# Patient Record
Sex: Female | Born: 1935 | Race: White | Hispanic: No | Smoking: Never smoker
Health system: Southern US, Community
[De-identification: ages and names within clinical notes are randomized; demographics above are authoritative.]

## PROBLEM LIST (undated history)

## (undated) DIAGNOSIS — K626 Ulcer of anus and rectum: Secondary | ICD-10-CM

## (undated) DIAGNOSIS — K5792 Diverticulitis of intestine, part unspecified, without perforation or abscess without bleeding: Secondary | ICD-10-CM

## (undated) DIAGNOSIS — I452 Bifascicular block: Secondary | ICD-10-CM

## (undated) DIAGNOSIS — K219 Gastro-esophageal reflux disease without esophagitis: Secondary | ICD-10-CM

## (undated) DIAGNOSIS — D649 Anemia, unspecified: Secondary | ICD-10-CM

## (undated) DIAGNOSIS — K5010 Crohn's disease of large intestine without complications: Secondary | ICD-10-CM

## (undated) DIAGNOSIS — K3184 Gastroparesis: Secondary | ICD-10-CM

## (undated) DIAGNOSIS — I10 Essential (primary) hypertension: Secondary | ICD-10-CM

## (undated) DIAGNOSIS — K589 Irritable bowel syndrome without diarrhea: Secondary | ICD-10-CM

## (undated) DIAGNOSIS — D126 Benign neoplasm of colon, unspecified: Secondary | ICD-10-CM

## (undated) DIAGNOSIS — E785 Hyperlipidemia, unspecified: Secondary | ICD-10-CM

## (undated) DIAGNOSIS — K5790 Diverticulosis of intestine, part unspecified, without perforation or abscess without bleeding: Secondary | ICD-10-CM

## (undated) DIAGNOSIS — H409 Unspecified glaucoma: Secondary | ICD-10-CM

## (undated) DIAGNOSIS — F419 Anxiety disorder, unspecified: Secondary | ICD-10-CM

## (undated) HISTORY — DX: Ulcer of anus and rectum: K62.6

## (undated) HISTORY — PX: KNEE SURGERY: SHX244

## (undated) HISTORY — DX: Essential (primary) hypertension: I10

## (undated) HISTORY — DX: Anxiety disorder, unspecified: F41.9

## (undated) HISTORY — PX: OTHER SURGICAL HISTORY: SHX169

## (undated) HISTORY — DX: Irritable bowel syndrome, unspecified: K58.9

## (undated) HISTORY — PX: MOUTH SURGERY: SHX715

## (undated) HISTORY — PX: TONSILLECTOMY: SUR1361

## (undated) HISTORY — PX: APPENDECTOMY: SHX54

## (undated) HISTORY — DX: Diverticulosis of intestine, part unspecified, without perforation or abscess without bleeding: K57.90

## (undated) HISTORY — PX: ARCUATE KERATECTOMY: SHX212

## (undated) HISTORY — DX: Unspecified glaucoma: H40.9

## (undated) HISTORY — DX: Crohn's disease of large intestine without complications: K50.10

## (undated) HISTORY — DX: Benign neoplasm of colon, unspecified: D12.6

## (undated) HISTORY — DX: Gastroparesis: K31.84

## (undated) HISTORY — PX: SALIVARY GLAND SURGERY: SHX768

## (undated) HISTORY — DX: Diverticulitis of intestine, part unspecified, without perforation or abscess without bleeding: K57.92

## (undated) HISTORY — DX: Gastro-esophageal reflux disease without esophagitis: K21.9

## (undated) HISTORY — PX: HERNIA REPAIR: SHX51

## (undated) HISTORY — DX: Anemia, unspecified: D64.9

## (undated) HISTORY — DX: Hyperlipidemia, unspecified: E78.5

---

## 1997-09-24 ENCOUNTER — Other Ambulatory Visit: Admission: RE | Admit: 1997-09-24 | Discharge: 1997-09-24 | Payer: Self-pay | Admitting: Internal Medicine

## 1998-01-14 ENCOUNTER — Ambulatory Visit (HOSPITAL_COMMUNITY): Admission: RE | Admit: 1998-01-14 | Discharge: 1998-01-14 | Payer: Self-pay | Admitting: Internal Medicine

## 1999-02-03 ENCOUNTER — Other Ambulatory Visit: Admission: RE | Admit: 1999-02-03 | Discharge: 1999-02-03 | Payer: Self-pay | Admitting: *Deleted

## 1999-07-01 ENCOUNTER — Other Ambulatory Visit: Admission: RE | Admit: 1999-07-01 | Discharge: 1999-07-01 | Payer: Self-pay | Admitting: Orthopaedic Surgery

## 2000-02-10 ENCOUNTER — Encounter (INDEPENDENT_AMBULATORY_CARE_PROVIDER_SITE_OTHER): Payer: Self-pay | Admitting: Specialist

## 2000-02-10 ENCOUNTER — Other Ambulatory Visit: Admission: RE | Admit: 2000-02-10 | Discharge: 2000-02-10 | Payer: Self-pay | Admitting: Gastroenterology

## 2000-05-05 ENCOUNTER — Other Ambulatory Visit: Admission: RE | Admit: 2000-05-05 | Discharge: 2000-05-05 | Payer: Self-pay | Admitting: *Deleted

## 2000-08-03 ENCOUNTER — Encounter: Admission: RE | Admit: 2000-08-03 | Discharge: 2000-08-03 | Payer: Self-pay | Admitting: Gastroenterology

## 2000-08-03 ENCOUNTER — Encounter: Payer: Self-pay | Admitting: Gastroenterology

## 2000-08-11 ENCOUNTER — Ambulatory Visit (HOSPITAL_COMMUNITY): Admission: RE | Admit: 2000-08-11 | Discharge: 2000-08-11 | Payer: Self-pay | Admitting: Gastroenterology

## 2000-08-11 ENCOUNTER — Encounter: Payer: Self-pay | Admitting: Gastroenterology

## 2000-09-13 ENCOUNTER — Inpatient Hospital Stay (HOSPITAL_COMMUNITY): Admission: AD | Admit: 2000-09-13 | Discharge: 2000-09-14 | Payer: Self-pay | Admitting: Interventional Cardiology

## 2000-09-14 ENCOUNTER — Encounter: Payer: Self-pay | Admitting: Interventional Cardiology

## 2001-05-11 ENCOUNTER — Other Ambulatory Visit: Admission: RE | Admit: 2001-05-11 | Discharge: 2001-05-11 | Payer: Self-pay | Admitting: *Deleted

## 2002-05-07 ENCOUNTER — Other Ambulatory Visit: Admission: RE | Admit: 2002-05-07 | Discharge: 2002-05-07 | Payer: Self-pay | Admitting: *Deleted

## 2002-07-30 ENCOUNTER — Encounter: Admission: RE | Admit: 2002-07-30 | Discharge: 2002-09-06 | Payer: Self-pay | Admitting: Internal Medicine

## 2002-10-02 ENCOUNTER — Encounter: Admission: RE | Admit: 2002-10-02 | Discharge: 2002-10-02 | Payer: Self-pay | Admitting: Obstetrics and Gynecology

## 2002-10-02 ENCOUNTER — Encounter: Payer: Self-pay | Admitting: Obstetrics and Gynecology

## 2003-05-20 ENCOUNTER — Other Ambulatory Visit: Admission: RE | Admit: 2003-05-20 | Discharge: 2003-05-20 | Payer: Self-pay | Admitting: Obstetrics and Gynecology

## 2004-05-18 ENCOUNTER — Ambulatory Visit: Payer: Self-pay | Admitting: Internal Medicine

## 2004-05-21 ENCOUNTER — Ambulatory Visit: Payer: Self-pay | Admitting: Internal Medicine

## 2004-05-26 ENCOUNTER — Other Ambulatory Visit: Admission: RE | Admit: 2004-05-26 | Discharge: 2004-05-26 | Payer: Self-pay | Admitting: Obstetrics and Gynecology

## 2004-06-15 ENCOUNTER — Ambulatory Visit: Payer: Self-pay | Admitting: Internal Medicine

## 2004-07-22 ENCOUNTER — Ambulatory Visit: Payer: Self-pay | Admitting: Internal Medicine

## 2004-08-03 ENCOUNTER — Ambulatory Visit: Payer: Self-pay | Admitting: Internal Medicine

## 2004-08-12 ENCOUNTER — Ambulatory Visit: Payer: Self-pay | Admitting: Internal Medicine

## 2004-10-14 ENCOUNTER — Ambulatory Visit: Payer: Self-pay | Admitting: Internal Medicine

## 2004-10-25 ENCOUNTER — Encounter: Admission: RE | Admit: 2004-10-25 | Discharge: 2004-10-25 | Payer: Self-pay | Admitting: Internal Medicine

## 2005-01-28 ENCOUNTER — Ambulatory Visit: Payer: Self-pay | Admitting: Internal Medicine

## 2005-01-31 ENCOUNTER — Ambulatory Visit: Payer: Self-pay | Admitting: Internal Medicine

## 2005-05-20 ENCOUNTER — Ambulatory Visit: Payer: Self-pay | Admitting: Internal Medicine

## 2005-06-16 ENCOUNTER — Ambulatory Visit: Payer: Self-pay | Admitting: Gastroenterology

## 2005-06-17 ENCOUNTER — Ambulatory Visit: Payer: Self-pay | Admitting: Internal Medicine

## 2005-06-29 ENCOUNTER — Ambulatory Visit: Payer: Self-pay | Admitting: Gastroenterology

## 2005-06-29 ENCOUNTER — Encounter (INDEPENDENT_AMBULATORY_CARE_PROVIDER_SITE_OTHER): Payer: Self-pay | Admitting: Specialist

## 2005-09-19 ENCOUNTER — Ambulatory Visit: Payer: Self-pay | Admitting: Internal Medicine

## 2005-10-07 ENCOUNTER — Ambulatory Visit: Payer: Self-pay | Admitting: Internal Medicine

## 2005-10-08 ENCOUNTER — Emergency Department (HOSPITAL_COMMUNITY): Admission: EM | Admit: 2005-10-08 | Discharge: 2005-10-08 | Payer: Self-pay | Admitting: Emergency Medicine

## 2005-10-26 ENCOUNTER — Ambulatory Visit: Payer: Self-pay | Admitting: Internal Medicine

## 2005-12-27 ENCOUNTER — Encounter: Admission: RE | Admit: 2005-12-27 | Discharge: 2005-12-27 | Payer: Self-pay | Admitting: Obstetrics and Gynecology

## 2006-01-31 ENCOUNTER — Encounter: Admission: RE | Admit: 2006-01-31 | Discharge: 2006-02-28 | Payer: Self-pay | Admitting: Orthopaedic Surgery

## 2006-02-16 ENCOUNTER — Ambulatory Visit: Payer: Self-pay | Admitting: Internal Medicine

## 2006-03-01 ENCOUNTER — Encounter: Admission: RE | Admit: 2006-03-01 | Discharge: 2006-03-22 | Payer: Self-pay | Admitting: Orthopaedic Surgery

## 2006-04-05 ENCOUNTER — Encounter: Admission: RE | Admit: 2006-04-05 | Discharge: 2006-04-05 | Payer: Self-pay | Admitting: Orthopaedic Surgery

## 2006-04-07 ENCOUNTER — Ambulatory Visit: Payer: Self-pay | Admitting: Internal Medicine

## 2006-04-07 ENCOUNTER — Encounter: Admission: RE | Admit: 2006-04-07 | Discharge: 2006-04-07 | Payer: Self-pay | Admitting: Internal Medicine

## 2006-05-24 ENCOUNTER — Encounter: Admission: RE | Admit: 2006-05-24 | Discharge: 2006-05-24 | Payer: Self-pay | Admitting: Obstetrics and Gynecology

## 2006-06-14 ENCOUNTER — Ambulatory Visit (HOSPITAL_COMMUNITY): Admission: RE | Admit: 2006-06-14 | Discharge: 2006-06-14 | Payer: Self-pay | Admitting: Obstetrics and Gynecology

## 2006-06-14 ENCOUNTER — Other Ambulatory Visit: Admission: RE | Admit: 2006-06-14 | Discharge: 2006-06-14 | Payer: Self-pay | Admitting: Obstetrics and Gynecology

## 2006-06-15 ENCOUNTER — Ambulatory Visit: Payer: Self-pay | Admitting: Internal Medicine

## 2006-06-15 ENCOUNTER — Ambulatory Visit: Payer: Self-pay | Admitting: Cardiology

## 2006-06-15 LAB — CONVERTED CEMR LAB
ALT: 15 units/L (ref 0–40)
AST: 21 units/L (ref 0–37)
Albumin: 3.6 g/dL (ref 3.5–5.2)
Alkaline Phosphatase: 91 units/L (ref 39–117)
Amylase: 37 units/L (ref 27–131)
BUN: 7 mg/dL (ref 6–23)
Basophils Absolute: 0 10*3/uL (ref 0.0–0.1)
Basophils Relative: 0.2 % (ref 0.0–1.0)
Bilirubin, Direct: 0.2 mg/dL (ref 0.0–0.3)
CO2: 30 meq/L (ref 19–32)
Calcium: 10 mg/dL (ref 8.4–10.5)
Chloride: 97 meq/L (ref 96–112)
Creatinine, Ser: 0.9 mg/dL (ref 0.4–1.2)
Eosinophils Absolute: 0 10*3/uL (ref 0.0–0.6)
Eosinophils Relative: 0.4 % (ref 0.0–5.0)
GFR calc Af Amer: 79 mL/min
GFR calc non Af Amer: 66 mL/min
Glucose, Bld: 86 mg/dL (ref 70–99)
HCT: 37.1 % (ref 36.0–46.0)
Hemoglobin: 13 g/dL (ref 12.0–15.0)
Lymphocytes Relative: 27.3 % (ref 12.0–46.0)
MCHC: 35 g/dL (ref 30.0–36.0)
MCV: 92.3 fL (ref 78.0–100.0)
Monocytes Absolute: 1.1 10*3/uL — ABNORMAL HIGH (ref 0.2–0.7)
Monocytes Relative: 11.7 % — ABNORMAL HIGH (ref 3.0–11.0)
Neutro Abs: 6 10*3/uL (ref 1.4–7.7)
Neutrophils Relative %: 60.4 % (ref 43.0–77.0)
Platelets: 256 10*3/uL (ref 150–400)
Potassium: 3.4 meq/L — ABNORMAL LOW (ref 3.5–5.1)
RBC: 4.02 M/uL (ref 3.87–5.11)
RDW: 12.6 % (ref 11.5–14.6)
Sodium: 135 meq/L (ref 135–145)
Total Bilirubin: 0.7 mg/dL (ref 0.3–1.2)
Total Protein: 6.6 g/dL (ref 6.0–8.3)
WBC: 9.8 10*3/uL (ref 4.5–10.5)

## 2006-06-17 ENCOUNTER — Ambulatory Visit: Payer: Self-pay | Admitting: Family Medicine

## 2006-06-28 ENCOUNTER — Ambulatory Visit: Payer: Self-pay | Admitting: Internal Medicine

## 2006-06-28 LAB — CONVERTED CEMR LAB
Cholesterol: 234 mg/dL (ref 0–200)
Direct LDL: 143.4 mg/dL
HDL: 49.7 mg/dL (ref >39.0)
TSH: 2.04 microintl units/mL (ref 0.35–5.50)
Total CHOL/HDL Ratio: 4.7
Triglycerides: 210 mg/dL (ref 0–149)
VLDL: 42 mg/dL — ABNORMAL HIGH (ref 0–40)

## 2006-06-29 ENCOUNTER — Encounter: Payer: Self-pay | Admitting: Internal Medicine

## 2006-07-03 ENCOUNTER — Ambulatory Visit: Payer: Self-pay | Admitting: Internal Medicine

## 2006-07-25 ENCOUNTER — Ambulatory Visit: Payer: Self-pay | Admitting: Internal Medicine

## 2006-07-26 LAB — CONVERTED CEMR LAB
Hgb A1c MFr Bld: 5.6 % (ref 4.6–6.0)
Vitamin B-12: 760 pg/mL (ref 211–911)

## 2006-07-28 ENCOUNTER — Ambulatory Visit: Payer: Self-pay

## 2006-09-02 ENCOUNTER — Ambulatory Visit: Payer: Self-pay | Admitting: Family Medicine

## 2006-09-22 ENCOUNTER — Ambulatory Visit: Payer: Self-pay | Admitting: Internal Medicine

## 2006-09-27 ENCOUNTER — Ambulatory Visit: Payer: Self-pay

## 2006-09-27 ENCOUNTER — Encounter: Payer: Self-pay | Admitting: Internal Medicine

## 2006-10-18 DIAGNOSIS — I10 Essential (primary) hypertension: Secondary | ICD-10-CM | POA: Insufficient documentation

## 2006-10-18 DIAGNOSIS — K5730 Diverticulosis of large intestine without perforation or abscess without bleeding: Secondary | ICD-10-CM | POA: Insufficient documentation

## 2006-10-18 DIAGNOSIS — K219 Gastro-esophageal reflux disease without esophagitis: Secondary | ICD-10-CM | POA: Insufficient documentation

## 2006-11-07 ENCOUNTER — Ambulatory Visit: Payer: Self-pay | Admitting: Internal Medicine

## 2006-11-07 DIAGNOSIS — E785 Hyperlipidemia, unspecified: Secondary | ICD-10-CM | POA: Insufficient documentation

## 2006-11-07 DIAGNOSIS — T887XXA Unspecified adverse effect of drug or medicament, initial encounter: Secondary | ICD-10-CM | POA: Insufficient documentation

## 2006-11-07 LAB — CONVERTED CEMR LAB
ALT: 18 units/L (ref 0–35)
AST: 23 units/L (ref 0–37)
Albumin: 3.9 g/dL (ref 3.5–5.2)
Alkaline Phosphatase: 80 units/L (ref 39–117)
Bilirubin, Direct: 0.1 mg/dL (ref 0.0–0.3)
Cholesterol: 200 mg/dL (ref 0–200)
Direct LDL: 109 mg/dL
HDL: 46.9 mg/dL (ref >39.0)
Total Bilirubin: 0.7 mg/dL (ref 0.3–1.2)
Total CHOL/HDL Ratio: 4.3
Total Protein: 6.5 g/dL (ref 6.0–8.3)
Triglycerides: 224 mg/dL (ref 0–149)
VLDL: 45 mg/dL — ABNORMAL HIGH (ref 0–40)

## 2006-11-13 ENCOUNTER — Ambulatory Visit: Payer: Self-pay | Admitting: Internal Medicine

## 2006-11-13 ENCOUNTER — Telehealth: Payer: Self-pay | Admitting: Internal Medicine

## 2006-11-13 LAB — CONVERTED CEMR LAB
Bilirubin Urine: NEGATIVE
Blood in Urine, dipstick: NEGATIVE
Glucose, Urine, Semiquant: NEGATIVE
Ketones, urine, test strip: NEGATIVE
Nitrite: NEGATIVE
Protein, U semiquant: NEGATIVE
Specific Gravity, Urine: 1.01
Urobilinogen, UA: 0.2
pH: 6

## 2006-11-15 ENCOUNTER — Telehealth (INDEPENDENT_AMBULATORY_CARE_PROVIDER_SITE_OTHER): Payer: Self-pay | Admitting: *Deleted

## 2006-11-15 ENCOUNTER — Ambulatory Visit: Payer: Self-pay | Admitting: Internal Medicine

## 2006-11-15 DIAGNOSIS — E039 Hypothyroidism, unspecified: Secondary | ICD-10-CM | POA: Insufficient documentation

## 2007-02-01 ENCOUNTER — Ambulatory Visit: Payer: Self-pay | Admitting: Internal Medicine

## 2007-02-14 ENCOUNTER — Ambulatory Visit: Payer: Self-pay | Admitting: Internal Medicine

## 2007-02-14 LAB — CONVERTED CEMR LAB
Basophils Absolute: 0 10*3/uL (ref 0.0–0.1)
Basophils Relative: 0.2 % (ref 0.0–1.0)
Eosinophils Absolute: 0.2 10*3/uL (ref 0.0–0.6)
Eosinophils Relative: 2.6 % (ref 0.0–5.0)
HCT: 37.7 % (ref 36.0–46.0)
Hemoglobin: 13.2 g/dL (ref 12.0–15.0)
Lymphocytes Relative: 40.4 % (ref 12.0–46.0)
MCHC: 35 g/dL (ref 30.0–36.0)
MCV: 91.6 fL (ref 78.0–100.0)
Monocytes Absolute: 0.7 10*3/uL (ref 0.2–0.7)
Monocytes Relative: 10.4 % (ref 3.0–11.0)
Neutro Abs: 3 10*3/uL (ref 1.4–7.7)
Neutrophils Relative %: 46.4 % (ref 43.0–77.0)
Platelets: 253 10*3/uL (ref 150–400)
RBC: 4.11 M/uL (ref 3.87–5.11)
RDW: 12.3 % (ref 11.5–14.6)
TSH: 3.13 microintl units/mL (ref 0.35–5.50)
WBC: 6.5 10*3/uL (ref 4.5–10.5)

## 2007-03-07 ENCOUNTER — Telehealth: Payer: Self-pay | Admitting: Internal Medicine

## 2007-03-14 ENCOUNTER — Telehealth: Payer: Self-pay | Admitting: Internal Medicine

## 2007-05-07 ENCOUNTER — Encounter: Payer: Self-pay | Admitting: Internal Medicine

## 2007-06-01 ENCOUNTER — Telehealth: Payer: Self-pay | Admitting: Internal Medicine

## 2007-06-14 ENCOUNTER — Encounter: Admission: RE | Admit: 2007-06-14 | Discharge: 2007-06-14 | Payer: Self-pay | Admitting: Obstetrics and Gynecology

## 2007-06-19 ENCOUNTER — Other Ambulatory Visit: Admission: RE | Admit: 2007-06-19 | Discharge: 2007-06-19 | Payer: Self-pay | Admitting: Obstetrics and Gynecology

## 2007-06-28 ENCOUNTER — Ambulatory Visit: Payer: Self-pay | Admitting: Internal Medicine

## 2007-06-28 LAB — CONVERTED CEMR LAB
ALT: 17 units/L (ref 0–35)
AST: 23 units/L (ref 0–37)
Albumin: 4.3 g/dL (ref 3.5–5.2)
Alkaline Phosphatase: 86 units/L (ref 39–117)
BUN: 7 mg/dL (ref 6–23)
Basophils Absolute: 0.1 10*3/uL (ref 0.0–0.1)
Basophils Relative: 0.6 % (ref 0.0–1.0)
Bilirubin, Direct: 0.1 mg/dL (ref 0.0–0.3)
CO2: 29 meq/L (ref 19–32)
Calcium: 10.2 mg/dL (ref 8.4–10.5)
Chloride: 100 meq/L (ref 96–112)
Cholesterol: 217 mg/dL (ref 0–200)
Creatinine, Ser: 0.9 mg/dL (ref 0.4–1.2)
Direct LDL: 137.7 mg/dL
Eosinophils Absolute: 0.1 10*3/uL (ref 0.0–0.6)
Eosinophils Relative: 0.9 % (ref 0.0–5.0)
GFR calc Af Amer: 79 mL/min
GFR calc non Af Amer: 65 mL/min
Glucose, Bld: 102 mg/dL — ABNORMAL HIGH (ref 70–99)
HCT: 38.8 % (ref 36.0–46.0)
HDL: 52.1 mg/dL (ref >39.0)
Hemoglobin: 13.1 g/dL (ref 12.0–15.0)
Lymphocytes Relative: 22.5 % (ref 12.0–46.0)
MCHC: 33.7 g/dL (ref 30.0–36.0)
MCV: 92 fL (ref 78.0–100.0)
Monocytes Absolute: 0.6 10*3/uL (ref 0.2–0.7)
Monocytes Relative: 6.6 % (ref 3.0–11.0)
Neutro Abs: 6.5 10*3/uL (ref 1.4–7.7)
Neutrophils Relative %: 69.4 % (ref 43.0–77.0)
Platelets: 249 10*3/uL (ref 150–400)
Potassium: 4.5 meq/L (ref 3.5–5.1)
RBC: 4.22 M/uL (ref 3.87–5.11)
RDW: 12.9 % (ref 11.5–14.6)
Sodium: 136 meq/L (ref 135–145)
TSH: 3.1 microintl units/mL (ref 0.35–5.50)
Total Bilirubin: 0.6 mg/dL (ref 0.3–1.2)
Total CHOL/HDL Ratio: 4.2
Total Protein: 6.6 g/dL (ref 6.0–8.3)
Triglycerides: 213 mg/dL (ref 0–149)
VLDL: 43 mg/dL — ABNORMAL HIGH (ref 0–40)
WBC: 9.4 10*3/uL (ref 4.5–10.5)

## 2007-07-04 ENCOUNTER — Encounter: Payer: Self-pay | Admitting: Internal Medicine

## 2007-07-04 ENCOUNTER — Ambulatory Visit: Payer: Self-pay

## 2007-07-09 ENCOUNTER — Ambulatory Visit: Payer: Self-pay | Admitting: Internal Medicine

## 2007-07-09 ENCOUNTER — Telehealth: Payer: Self-pay | Admitting: Internal Medicine

## 2007-07-10 ENCOUNTER — Encounter: Payer: Self-pay | Admitting: Internal Medicine

## 2007-07-20 ENCOUNTER — Ambulatory Visit: Payer: Self-pay | Admitting: Internal Medicine

## 2007-08-07 LAB — CONVERTED CEMR LAB
CA 125: 10.7 units/mL (ref 0.0–30.2)
CEA: <0.5 ng/mL (ref 0.0–5.0)

## 2007-08-17 ENCOUNTER — Ambulatory Visit: Payer: Self-pay | Admitting: Internal Medicine

## 2007-08-17 DIAGNOSIS — M6740 Ganglion, unspecified site: Secondary | ICD-10-CM | POA: Insufficient documentation

## 2007-08-30 ENCOUNTER — Encounter: Admission: RE | Admit: 2007-08-30 | Discharge: 2007-08-30 | Payer: Self-pay | Admitting: Internal Medicine

## 2007-09-25 ENCOUNTER — Ambulatory Visit: Payer: Self-pay | Admitting: Internal Medicine

## 2007-10-10 ENCOUNTER — Encounter: Payer: Self-pay | Admitting: Internal Medicine

## 2007-10-10 ENCOUNTER — Ambulatory Visit: Payer: Self-pay

## 2007-10-24 ENCOUNTER — Ambulatory Visit: Payer: Self-pay | Admitting: Internal Medicine

## 2007-11-12 ENCOUNTER — Telehealth: Payer: Self-pay | Admitting: Internal Medicine

## 2007-11-14 ENCOUNTER — Telehealth: Payer: Self-pay | Admitting: Internal Medicine

## 2007-12-24 ENCOUNTER — Ambulatory Visit: Payer: Self-pay | Admitting: Internal Medicine

## 2007-12-28 LAB — CONVERTED CEMR LAB
T3, Free: 2.8 pg/mL (ref 2.3–4.2)
T4, Total: 9 ug/dL (ref 5.0–12.5)
TSH: 0.86 microintl units/mL (ref 0.35–5.50)

## 2008-02-12 ENCOUNTER — Telehealth: Payer: Self-pay | Admitting: Internal Medicine

## 2008-02-14 ENCOUNTER — Telehealth: Payer: Self-pay | Admitting: Gastroenterology

## 2008-02-21 ENCOUNTER — Telehealth: Payer: Self-pay | Admitting: Internal Medicine

## 2008-03-11 ENCOUNTER — Encounter: Admission: RE | Admit: 2008-03-11 | Discharge: 2008-04-03 | Payer: Self-pay | Admitting: Orthopaedic Surgery

## 2008-03-12 ENCOUNTER — Ambulatory Visit: Payer: Self-pay | Admitting: Gastroenterology

## 2008-03-12 ENCOUNTER — Telehealth: Payer: Self-pay | Admitting: Gastroenterology

## 2008-03-12 DIAGNOSIS — R1319 Other dysphagia: Secondary | ICD-10-CM | POA: Insufficient documentation

## 2008-03-20 ENCOUNTER — Ambulatory Visit: Payer: Self-pay | Admitting: Internal Medicine

## 2008-03-20 DIAGNOSIS — J309 Allergic rhinitis, unspecified: Secondary | ICD-10-CM | POA: Insufficient documentation

## 2008-04-07 ENCOUNTER — Ambulatory Visit (HOSPITAL_COMMUNITY): Admission: RE | Admit: 2008-04-07 | Discharge: 2008-04-07 | Payer: Self-pay | Admitting: Gastroenterology

## 2008-04-07 ENCOUNTER — Encounter: Admission: RE | Admit: 2008-04-07 | Discharge: 2008-05-21 | Payer: Self-pay | Admitting: Podiatry

## 2008-04-09 ENCOUNTER — Telehealth: Payer: Self-pay | Admitting: Gastroenterology

## 2008-04-14 ENCOUNTER — Ambulatory Visit: Payer: Self-pay | Admitting: Gastroenterology

## 2008-04-16 ENCOUNTER — Telehealth: Payer: Self-pay | Admitting: Gastroenterology

## 2008-06-23 ENCOUNTER — Encounter: Admission: RE | Admit: 2008-06-23 | Discharge: 2008-06-23 | Payer: Self-pay | Admitting: Obstetrics and Gynecology

## 2008-06-26 ENCOUNTER — Encounter: Admission: RE | Admit: 2008-06-26 | Discharge: 2008-06-26 | Payer: Self-pay | Admitting: Obstetrics and Gynecology

## 2008-06-30 ENCOUNTER — Encounter: Admission: RE | Admit: 2008-06-30 | Discharge: 2008-06-30 | Payer: Self-pay | Admitting: Oral Surgery

## 2008-07-02 ENCOUNTER — Ambulatory Visit (HOSPITAL_BASED_OUTPATIENT_CLINIC_OR_DEPARTMENT_OTHER): Admission: RE | Admit: 2008-07-02 | Discharge: 2008-07-02 | Payer: Self-pay | Admitting: Oral Surgery

## 2008-07-03 ENCOUNTER — Encounter: Payer: Self-pay | Admitting: Internal Medicine

## 2008-07-03 LAB — CONVERTED CEMR LAB: Pap Smear: NORMAL

## 2008-07-22 ENCOUNTER — Ambulatory Visit: Payer: Self-pay | Admitting: Gastroenterology

## 2008-07-22 DIAGNOSIS — K3184 Gastroparesis: Secondary | ICD-10-CM | POA: Insufficient documentation

## 2008-07-25 ENCOUNTER — Other Ambulatory Visit: Admission: RE | Admit: 2008-07-25 | Discharge: 2008-07-25 | Payer: Self-pay | Admitting: Obstetrics and Gynecology

## 2008-07-28 ENCOUNTER — Ambulatory Visit: Payer: Self-pay | Admitting: Internal Medicine

## 2008-07-28 DIAGNOSIS — I779 Disorder of arteries and arterioles, unspecified: Secondary | ICD-10-CM | POA: Insufficient documentation

## 2008-07-28 DIAGNOSIS — I6529 Occlusion and stenosis of unspecified carotid artery: Secondary | ICD-10-CM | POA: Insufficient documentation

## 2008-07-28 LAB — CONVERTED CEMR LAB
ALT: 15 units/L (ref 0–35)
AST: 20 units/L (ref 0–37)
Albumin: 3.9 g/dL (ref 3.5–5.2)
Alkaline Phosphatase: 96 units/L (ref 39–117)
BUN: 11 mg/dL (ref 6–23)
Basophils Absolute: 0 10*3/uL (ref 0.0–0.1)
Basophils Relative: 0.5 % (ref 0.0–3.0)
Bilirubin, Direct: 0.1 mg/dL (ref 0.0–0.3)
CO2: 30 meq/L (ref 19–32)
Calcium: 10.2 mg/dL (ref 8.4–10.5)
Chloride: 107 meq/L (ref 96–112)
Cholesterol, target level: 200 mg/dL
Cholesterol: 209 mg/dL — ABNORMAL HIGH (ref 0–200)
Creatinine, Ser: 0.7 mg/dL (ref 0.4–1.2)
Direct LDL: 124 mg/dL
Eosinophils Absolute: 0.2 10*3/uL (ref 0.0–0.7)
Eosinophils Relative: 2.3 % (ref 0.0–5.0)
GFR calc non Af Amer: 87.08 mL/min (ref >60)
Glucose, Bld: 89 mg/dL (ref 70–99)
HCT: 37.5 % (ref 36.0–46.0)
HDL goal, serum: 40 mg/dL
HDL: 49.9 mg/dL (ref >39.00)
Hemoglobin: 12.7 g/dL (ref 12.0–15.0)
LDL Goal: 130 mg/dL
Lymphocytes Relative: 32.9 % (ref 12.0–46.0)
Lymphs Abs: 2.5 10*3/uL (ref 0.7–4.0)
MCHC: 33.8 g/dL (ref 30.0–36.0)
MCV: 94 fL (ref 78.0–100.0)
Monocytes Absolute: 0.6 10*3/uL (ref 0.1–1.0)
Monocytes Relative: 7.5 % (ref 3.0–12.0)
Neutro Abs: 4.3 10*3/uL (ref 1.4–7.7)
Neutrophils Relative %: 56.8 % (ref 43.0–77.0)
Platelets: 257 10*3/uL (ref 150.0–400.0)
Potassium: 4.1 meq/L (ref 3.5–5.1)
RBC: 3.99 M/uL (ref 3.87–5.11)
RDW: 12.7 % (ref 11.5–14.6)
Sodium: 141 meq/L (ref 135–145)
TSH: 0.99 microintl units/mL (ref 0.35–5.50)
Total Bilirubin: 0.8 mg/dL (ref 0.3–1.2)
Total CHOL/HDL Ratio: 4
Total Protein: 6.6 g/dL (ref 6.0–8.3)
Triglycerides: 155 mg/dL — ABNORMAL HIGH (ref 0.0–149.0)
VLDL: 31 mg/dL (ref 0.0–40.0)
WBC: 7.6 10*3/uL (ref 4.5–10.5)

## 2008-08-20 ENCOUNTER — Telehealth: Payer: Self-pay | Admitting: Internal Medicine

## 2008-10-10 ENCOUNTER — Ambulatory Visit: Payer: Self-pay

## 2008-10-10 ENCOUNTER — Encounter: Payer: Self-pay | Admitting: Internal Medicine

## 2008-12-17 ENCOUNTER — Encounter: Payer: Self-pay | Admitting: Internal Medicine

## 2009-01-02 ENCOUNTER — Telehealth: Payer: Self-pay | Admitting: *Deleted

## 2009-01-02 ENCOUNTER — Ambulatory Visit: Payer: Self-pay | Admitting: Internal Medicine

## 2009-01-02 LAB — CONVERTED CEMR LAB
Bilirubin Urine: NEGATIVE
Blood in Urine, dipstick: NEGATIVE
Glucose, Urine, Semiquant: NEGATIVE
Ketones, urine, test strip: NEGATIVE
Nitrite: NEGATIVE
Protein, U semiquant: NEGATIVE
Specific Gravity, Urine: 1.01
Urobilinogen, UA: 0.2
pH: 7

## 2009-01-03 ENCOUNTER — Encounter: Payer: Self-pay | Admitting: Internal Medicine

## 2009-01-13 ENCOUNTER — Telehealth: Payer: Self-pay | Admitting: Internal Medicine

## 2009-01-13 ENCOUNTER — Encounter: Payer: Self-pay | Admitting: Internal Medicine

## 2009-01-13 LAB — CONVERTED CEMR LAB
Bilirubin Urine: NEGATIVE
Blood in Urine, dipstick: NEGATIVE
Glucose, Urine, Semiquant: NEGATIVE
Ketones, urine, test strip: NEGATIVE
Nitrite: NEGATIVE
Protein, U semiquant: NEGATIVE
Specific Gravity, Urine: 1.01
Urobilinogen, UA: 0.2
pH: 7

## 2009-01-14 ENCOUNTER — Encounter: Payer: Self-pay | Admitting: Internal Medicine

## 2009-01-17 ENCOUNTER — Emergency Department (HOSPITAL_COMMUNITY): Admission: EM | Admit: 2009-01-17 | Discharge: 2009-01-17 | Payer: Self-pay | Admitting: Emergency Medicine

## 2009-01-19 ENCOUNTER — Telehealth: Payer: Self-pay | Admitting: Internal Medicine

## 2009-01-20 ENCOUNTER — Telehealth: Payer: Self-pay | Admitting: Internal Medicine

## 2009-01-22 ENCOUNTER — Encounter: Payer: Self-pay | Admitting: Internal Medicine

## 2009-03-03 ENCOUNTER — Ambulatory Visit: Payer: Self-pay | Admitting: Internal Medicine

## 2009-03-05 ENCOUNTER — Ambulatory Visit: Payer: Self-pay | Admitting: Internal Medicine

## 2009-03-05 LAB — CONVERTED CEMR LAB
ALT: 16 units/L (ref 0–35)
AST: 18 units/L (ref 0–37)
Albumin: 3.6 g/dL (ref 3.5–5.2)
Alkaline Phosphatase: 92 units/L (ref 39–117)
Amylase: 57 units/L (ref 27–131)
BUN: 9 mg/dL (ref 6–23)
Basophils Absolute: 0.1 10*3/uL (ref 0.0–0.1)
Basophils Relative: 0.7 % (ref 0.0–3.0)
Bilirubin, Direct: 0.1 mg/dL (ref 0.0–0.3)
CO2: 29 meq/L (ref 19–32)
Calcium: 9.9 mg/dL (ref 8.4–10.5)
Chloride: 104 meq/L (ref 96–112)
Creatinine, Ser: 0.8 mg/dL (ref 0.4–1.2)
Eosinophils Absolute: 0.2 10*3/uL (ref 0.0–0.7)
Eosinophils Relative: 2.4 % (ref 0.0–5.0)
GFR calc non Af Amer: 74.52 mL/min (ref >60)
Glucose, Bld: 88 mg/dL (ref 70–99)
HCT: 32 % — ABNORMAL LOW (ref 36.0–46.0)
Hemoglobin: 10.8 g/dL — ABNORMAL LOW (ref 12.0–15.0)
Lymphocytes Relative: 28.5 % (ref 12.0–46.0)
Lymphs Abs: 2.2 10*3/uL (ref 0.7–4.0)
MCHC: 33.6 g/dL (ref 30.0–36.0)
MCV: 97.4 fL (ref 78.0–100.0)
Monocytes Absolute: 0.8 10*3/uL (ref 0.1–1.0)
Monocytes Relative: 9.9 % (ref 3.0–12.0)
Neutro Abs: 4.5 10*3/uL (ref 1.4–7.7)
Neutrophils Relative %: 58.5 % (ref 43.0–77.0)
Platelets: 263 10*3/uL (ref 150.0–400.0)
Potassium: 3.8 meq/L (ref 3.5–5.1)
RBC: 3.29 M/uL — ABNORMAL LOW (ref 3.87–5.11)
RDW: 14.1 % (ref 11.5–14.6)
Sodium: 139 meq/L (ref 135–145)
TSH: 1.45 microintl units/mL (ref 0.35–5.50)
Total Bilirubin: 0.6 mg/dL (ref 0.3–1.2)
Total Protein: 6.2 g/dL (ref 6.0–8.3)
WBC: 7.8 10*3/uL (ref 4.5–10.5)

## 2009-03-12 ENCOUNTER — Telehealth: Payer: Self-pay | Admitting: Gastroenterology

## 2009-03-12 ENCOUNTER — Telehealth: Payer: Self-pay | Admitting: Internal Medicine

## 2009-03-16 ENCOUNTER — Encounter: Payer: Self-pay | Admitting: Nurse Practitioner

## 2009-03-16 ENCOUNTER — Ambulatory Visit: Payer: Self-pay | Admitting: Gastroenterology

## 2009-03-18 ENCOUNTER — Ambulatory Visit: Payer: Self-pay | Admitting: Internal Medicine

## 2009-03-18 ENCOUNTER — Ambulatory Visit: Payer: Self-pay | Admitting: Cardiology

## 2009-03-18 LAB — CONVERTED CEMR LAB
OCCULT 1: NEGATIVE
OCCULT 2: NEGATIVE
OCCULT 3: NEGATIVE

## 2009-03-23 ENCOUNTER — Telehealth: Payer: Self-pay | Admitting: Nurse Practitioner

## 2009-03-30 LAB — CONVERTED CEMR LAB
Ferritin: 26 ng/mL (ref 10.0–291.0)
Folate: >20 ng/mL
Iron: 44 ug/dL (ref 42–145)
Saturation Ratios: 13.9 % — ABNORMAL LOW (ref 20.0–50.0)
Transferrin: 225.9 mg/dL (ref 212.0–360.0)
Vitamin B-12: 787 pg/mL (ref 211–911)

## 2009-04-27 ENCOUNTER — Ambulatory Visit (HOSPITAL_COMMUNITY): Admission: RE | Admit: 2009-04-27 | Discharge: 2009-04-27 | Payer: Self-pay | Admitting: Obstetrics and Gynecology

## 2009-04-28 ENCOUNTER — Ambulatory Visit: Payer: Self-pay | Admitting: Internal Medicine

## 2009-05-02 ENCOUNTER — Ambulatory Visit: Payer: Self-pay | Admitting: Family Medicine

## 2009-05-02 ENCOUNTER — Encounter: Payer: Self-pay | Admitting: Internal Medicine

## 2009-05-02 LAB — CONVERTED CEMR LAB
Bilirubin Urine: NEGATIVE
Blood in Urine, dipstick: NEGATIVE
Glucose, Urine, Semiquant: NEGATIVE
Ketones, urine, test strip: NEGATIVE
Nitrite: NEGATIVE
Protein, U semiquant: NEGATIVE
Specific Gravity, Urine: 1.01
Urobilinogen, UA: 0.2
WBC Urine, dipstick: NEGATIVE
pH: 6

## 2009-05-05 DIAGNOSIS — D126 Benign neoplasm of colon, unspecified: Secondary | ICD-10-CM

## 2009-05-05 HISTORY — DX: Benign neoplasm of colon, unspecified: D12.6

## 2009-05-06 ENCOUNTER — Ambulatory Visit: Payer: Self-pay | Admitting: Gastroenterology

## 2009-05-06 ENCOUNTER — Telehealth: Payer: Self-pay | Admitting: Internal Medicine

## 2009-05-06 ENCOUNTER — Encounter (INDEPENDENT_AMBULATORY_CARE_PROVIDER_SITE_OTHER): Payer: Self-pay | Admitting: *Deleted

## 2009-05-06 DIAGNOSIS — Z8639 Personal history of other endocrine, nutritional and metabolic disease: Secondary | ICD-10-CM | POA: Insufficient documentation

## 2009-05-07 ENCOUNTER — Encounter: Admission: RE | Admit: 2009-05-07 | Discharge: 2009-06-02 | Payer: Self-pay | Admitting: Podiatry

## 2009-05-07 LAB — CONVERTED CEMR LAB
Basophils Absolute: 0 10*3/uL (ref 0.0–0.1)
Basophils Relative: 0.3 % (ref 0.0–3.0)
Eosinophils Absolute: 0.2 10*3/uL (ref 0.0–0.7)
Eosinophils Relative: 2.1 % (ref 0.0–5.0)
HCT: 37.3 % (ref 36.0–46.0)
Hemoglobin: 12.2 g/dL (ref 12.0–15.0)
IgA: 96 mg/dL (ref 68–378)
Lymphocytes Relative: 35.6 % (ref 12.0–46.0)
Lymphs Abs: 3 10*3/uL (ref 0.7–4.0)
MCHC: 32.6 g/dL (ref 30.0–36.0)
MCV: 96.5 fL (ref 78.0–100.0)
Monocytes Absolute: 0.8 10*3/uL (ref 0.1–1.0)
Monocytes Relative: 9.1 % (ref 3.0–12.0)
Neutro Abs: 4.4 10*3/uL (ref 1.4–7.7)
Neutrophils Relative %: 52.9 % (ref 43.0–77.0)
Platelets: 252 10*3/uL (ref 150.0–400.0)
RBC: 3.87 M/uL (ref 3.87–5.11)
RDW: 12.4 % (ref 11.5–14.6)
WBC: 8.4 10*3/uL (ref 4.5–10.5)

## 2009-05-08 LAB — CONVERTED CEMR LAB: Tissue Transglutaminase Ab, IgA: 0.1 units (ref <7)

## 2009-05-11 ENCOUNTER — Telehealth: Payer: Self-pay | Admitting: Gastroenterology

## 2009-05-12 ENCOUNTER — Ambulatory Visit: Payer: Self-pay | Admitting: Gastroenterology

## 2009-05-12 LAB — HM COLONOSCOPY

## 2009-05-13 ENCOUNTER — Encounter: Payer: Self-pay | Admitting: Gastroenterology

## 2009-05-14 ENCOUNTER — Encounter: Payer: Self-pay | Admitting: Gastroenterology

## 2009-05-14 ENCOUNTER — Telehealth: Payer: Self-pay | Admitting: Gastroenterology

## 2009-05-20 ENCOUNTER — Telehealth: Payer: Self-pay | Admitting: Gastroenterology

## 2009-06-10 ENCOUNTER — Ambulatory Visit: Payer: Self-pay | Admitting: Gastroenterology

## 2009-06-10 DIAGNOSIS — D126 Benign neoplasm of colon, unspecified: Secondary | ICD-10-CM | POA: Insufficient documentation

## 2009-06-10 DIAGNOSIS — K5732 Diverticulitis of large intestine without perforation or abscess without bleeding: Secondary | ICD-10-CM | POA: Insufficient documentation

## 2009-06-16 ENCOUNTER — Telehealth: Payer: Self-pay | Admitting: Internal Medicine

## 2009-06-29 ENCOUNTER — Encounter: Payer: Self-pay | Admitting: Internal Medicine

## 2009-07-17 ENCOUNTER — Encounter: Admission: RE | Admit: 2009-07-17 | Discharge: 2009-07-17 | Payer: Self-pay | Admitting: Orthopaedic Surgery

## 2009-08-04 ENCOUNTER — Telehealth: Payer: Self-pay | Admitting: Internal Medicine

## 2009-08-10 ENCOUNTER — Encounter: Admission: RE | Admit: 2009-08-10 | Discharge: 2009-09-07 | Payer: Self-pay | Admitting: Orthopaedic Surgery

## 2009-08-21 ENCOUNTER — Telehealth: Payer: Self-pay | Admitting: Internal Medicine

## 2009-08-21 ENCOUNTER — Ambulatory Visit: Payer: Self-pay | Admitting: Internal Medicine

## 2009-08-21 LAB — CONVERTED CEMR LAB
ALT: 12 units/L (ref 0–35)
AST: 18 units/L (ref 0–37)
Albumin: 4.1 g/dL (ref 3.5–5.2)
Alkaline Phosphatase: 95 units/L (ref 39–117)
BUN: 12 mg/dL (ref 6–23)
Basophils Absolute: 0.1 10*3/uL (ref 0.0–0.1)
Basophils Relative: 0.7 % (ref 0.0–3.0)
Bilirubin, Direct: 0.1 mg/dL (ref 0.0–0.3)
CO2: 30 meq/L (ref 19–32)
Calcium: 10.1 mg/dL (ref 8.4–10.5)
Chloride: 104 meq/L (ref 96–112)
Cholesterol: 201 mg/dL — ABNORMAL HIGH (ref 0–200)
Creatinine, Ser: 0.7 mg/dL (ref 0.4–1.2)
Direct LDL: 125.2 mg/dL
Eosinophils Absolute: 0.3 10*3/uL (ref 0.0–0.7)
Eosinophils Relative: 3.1 % (ref 0.0–5.0)
GFR calc non Af Amer: 82.72 mL/min (ref >60)
Glucose, Bld: 93 mg/dL (ref 70–99)
HCT: 36.8 % (ref 36.0–46.0)
HDL: 53 mg/dL (ref >39.00)
Hemoglobin: 12.5 g/dL (ref 12.0–15.0)
Iron: 79 ug/dL (ref 42–145)
Lymphocytes Relative: 31 % (ref 12.0–46.0)
Lymphs Abs: 2.5 10*3/uL (ref 0.7–4.0)
MCHC: 34 g/dL (ref 30.0–36.0)
MCV: 95.5 fL (ref 78.0–100.0)
Monocytes Absolute: 0.6 10*3/uL (ref 0.1–1.0)
Monocytes Relative: 7.9 % (ref 3.0–12.0)
Neutro Abs: 4.7 10*3/uL (ref 1.4–7.7)
Neutrophils Relative %: 57.3 % (ref 43.0–77.0)
Platelets: 320 10*3/uL (ref 150.0–400.0)
Potassium: 4.9 meq/L (ref 3.5–5.1)
RBC: 3.85 M/uL — ABNORMAL LOW (ref 3.87–5.11)
RDW: 14.1 % (ref 11.5–14.6)
Saturation Ratios: 22.9 % (ref 20.0–50.0)
Sodium: 140 meq/L (ref 135–145)
TSH: 0.65 microintl units/mL (ref 0.35–5.50)
Total Bilirubin: 0.6 mg/dL (ref 0.3–1.2)
Total CHOL/HDL Ratio: 4
Total Protein: 6.8 g/dL (ref 6.0–8.3)
Transferrin: 246.3 mg/dL (ref 212.0–360.0)
Triglycerides: 200 mg/dL — ABNORMAL HIGH (ref 0.0–149.0)
VLDL: 40 mg/dL (ref 0.0–40.0)
Vit D, 25-Hydroxy: 55 ng/mL (ref 30–89)
WBC: 8.2 10*3/uL (ref 4.5–10.5)

## 2009-08-21 LAB — HM MAMMOGRAPHY

## 2009-08-28 ENCOUNTER — Telehealth: Payer: Self-pay | Admitting: Internal Medicine

## 2009-09-02 ENCOUNTER — Telehealth: Payer: Self-pay | Admitting: Internal Medicine

## 2009-09-22 ENCOUNTER — Ambulatory Visit: Payer: Self-pay | Admitting: Internal Medicine

## 2009-09-22 DIAGNOSIS — I739 Peripheral vascular disease, unspecified: Secondary | ICD-10-CM | POA: Insufficient documentation

## 2009-09-22 DIAGNOSIS — G63 Polyneuropathy in diseases classified elsewhere: Secondary | ICD-10-CM | POA: Insufficient documentation

## 2009-10-08 ENCOUNTER — Encounter: Payer: Self-pay | Admitting: Internal Medicine

## 2009-10-19 ENCOUNTER — Encounter: Payer: Self-pay | Admitting: Internal Medicine

## 2009-10-20 ENCOUNTER — Ambulatory Visit: Payer: Self-pay

## 2009-10-20 ENCOUNTER — Encounter: Payer: Self-pay | Admitting: Internal Medicine

## 2009-10-29 ENCOUNTER — Ambulatory Visit: Payer: Self-pay | Admitting: Internal Medicine

## 2009-10-29 DIAGNOSIS — IMO0002 Reserved for concepts with insufficient information to code with codable children: Secondary | ICD-10-CM | POA: Insufficient documentation

## 2009-10-29 DIAGNOSIS — G43809 Other migraine, not intractable, without status migrainosus: Secondary | ICD-10-CM | POA: Insufficient documentation

## 2009-10-29 LAB — CONVERTED CEMR LAB
BUN: 11 mg/dL (ref 6–23)
Basophils Absolute: 0.1 10*3/uL (ref 0.0–0.1)
Basophils Relative: 0.7 % (ref 0.0–3.0)
CO2: 27 meq/L (ref 19–32)
Calcium: 9.5 mg/dL (ref 8.4–10.5)
Chloride: 96 meq/L (ref 96–112)
Creatinine, Ser: 0.8 mg/dL (ref 0.4–1.2)
Eosinophils Absolute: 0.2 10*3/uL (ref 0.0–0.7)
Eosinophils Relative: 2 % (ref 0.0–5.0)
GFR calc non Af Amer: 78.92 mL/min (ref >60)
Glucose, Bld: 82 mg/dL (ref 70–99)
HCT: 36.5 % (ref 36.0–46.0)
Hemoglobin: 12.4 g/dL (ref 12.0–15.0)
Lymphocytes Relative: 27.4 % (ref 12.0–46.0)
Lymphs Abs: 3.2 10*3/uL (ref 0.7–4.0)
MCHC: 34.1 g/dL (ref 30.0–36.0)
MCV: 94.9 fL (ref 78.0–100.0)
Monocytes Absolute: 1 10*3/uL (ref 0.1–1.0)
Monocytes Relative: 8.2 % (ref 3.0–12.0)
Neutro Abs: 7.3 10*3/uL (ref 1.4–7.7)
Neutrophils Relative %: 61.7 % (ref 43.0–77.0)
Platelets: 296 10*3/uL (ref 150.0–400.0)
Potassium: 3.6 meq/L (ref 3.5–5.1)
RBC: 3.85 M/uL — ABNORMAL LOW (ref 3.87–5.11)
RDW: 13.5 % (ref 11.5–14.6)
Sodium: 138 meq/L (ref 135–145)
WBC: 11.8 10*3/uL — ABNORMAL HIGH (ref 4.5–10.5)

## 2009-10-31 ENCOUNTER — Encounter: Admission: RE | Admit: 2009-10-31 | Discharge: 2009-10-31 | Payer: Self-pay | Admitting: Internal Medicine

## 2009-11-17 ENCOUNTER — Encounter: Payer: Self-pay | Admitting: Internal Medicine

## 2009-12-15 ENCOUNTER — Encounter: Payer: Self-pay | Admitting: Internal Medicine

## 2010-01-05 ENCOUNTER — Ambulatory Visit: Payer: Self-pay | Admitting: Internal Medicine

## 2010-01-26 ENCOUNTER — Encounter: Payer: Self-pay | Admitting: Internal Medicine

## 2010-02-15 ENCOUNTER — Telehealth: Payer: Self-pay | Admitting: Internal Medicine

## 2010-03-09 ENCOUNTER — Ambulatory Visit: Payer: Self-pay | Admitting: Internal Medicine

## 2010-03-09 DIAGNOSIS — M818 Other osteoporosis without current pathological fracture: Secondary | ICD-10-CM | POA: Insufficient documentation

## 2010-03-09 DIAGNOSIS — M48061 Spinal stenosis, lumbar region without neurogenic claudication: Secondary | ICD-10-CM | POA: Insufficient documentation

## 2010-03-09 DIAGNOSIS — M4120 Other idiopathic scoliosis, site unspecified: Secondary | ICD-10-CM | POA: Insufficient documentation

## 2010-03-09 DIAGNOSIS — M810 Age-related osteoporosis without current pathological fracture: Secondary | ICD-10-CM | POA: Insufficient documentation

## 2010-03-09 HISTORY — DX: Other idiopathic scoliosis, site unspecified: M41.20

## 2010-03-09 LAB — CONVERTED CEMR LAB
BUN: 11 mg/dL (ref 6–23)
Basophils Absolute: 0 10*3/uL (ref 0.0–0.1)
Basophils Relative: 0.4 % (ref 0.0–3.0)
CO2: 28 meq/L (ref 19–32)
Calcium: 9.5 mg/dL (ref 8.4–10.5)
Chloride: 99 meq/L (ref 96–112)
Creatinine, Ser: 0.8 mg/dL (ref 0.4–1.2)
Eosinophils Absolute: 0.1 10*3/uL (ref 0.0–0.7)
Eosinophils Relative: 1.3 % (ref 0.0–5.0)
Folate: 10.9 ng/mL
GFR calc non Af Amer: 74.31 mL/min (ref >60.00)
Glucose, Bld: 87 mg/dL (ref 70–99)
HCT: 30 % — ABNORMAL LOW (ref 36.0–46.0)
Hemoglobin: 10 g/dL — ABNORMAL LOW (ref 12.0–15.0)
Iron: 39 ug/dL — ABNORMAL LOW (ref 42–145)
Lymphocytes Relative: 20.4 % (ref 12.0–46.0)
Lymphs Abs: 1.9 10*3/uL (ref 0.7–4.0)
MCHC: 33.5 g/dL (ref 30.0–36.0)
MCV: 96.6 fL (ref 78.0–100.0)
Monocytes Absolute: 0.9 10*3/uL (ref 0.1–1.0)
Monocytes Relative: 9.1 % (ref 3.0–12.0)
Neutro Abs: 6.5 10*3/uL (ref 1.4–7.7)
Neutrophils Relative %: 68.8 % (ref 43.0–77.0)
Platelets: 326 10*3/uL (ref 150.0–400.0)
Potassium: 4 meq/L (ref 3.5–5.1)
RBC: 3.1 M/uL — ABNORMAL LOW (ref 3.87–5.11)
RDW: 15.1 % — ABNORMAL HIGH (ref 11.5–14.6)
Saturation Ratios: 13.7 % — ABNORMAL LOW (ref 20.0–50.0)
Sodium: 135 meq/L (ref 135–145)
TSH: 0.88 microintl units/mL (ref 0.35–5.50)
Transferrin: 203.9 mg/dL — ABNORMAL LOW (ref 212.0–360.0)
Vitamin B-12: 806 pg/mL (ref 211–911)
WBC: 9.4 10*3/uL (ref 4.5–10.5)

## 2010-03-23 ENCOUNTER — Emergency Department (HOSPITAL_COMMUNITY)
Admission: EM | Admit: 2010-03-23 | Discharge: 2010-03-23 | Payer: Self-pay | Source: Home / Self Care | Admitting: Emergency Medicine

## 2010-03-23 ENCOUNTER — Telehealth: Payer: Self-pay | Admitting: Internal Medicine

## 2010-04-25 ENCOUNTER — Encounter: Payer: Self-pay | Admitting: Obstetrics and Gynecology

## 2010-05-04 NOTE — Assessment & Plan Note (Signed)
Summary: EMP/WILL FAST/CCM   Vital Signs:  Patient Profile:   77 Years Old Female Height:     64 inches Weight:      123 pounds Temp:     98.2 degrees F oral Pulse rate:   72 / minute Resp:     14 per minute BP sitting:   136 / 70  (left arm)  Vitals Entered By: Willy Eddy, LPN (June 28, 2007 8:53 AM)                 Chief Complaint:  fasting this am/dt in 2001/colonsocopy in 2007/pap and breast exam with dr Lafonda Mosses collins/continues to c/o indigestion with no relief with nexium 40 bid --pt stopped reglan due to too many side effects/couldnt tolerate zegerid because pills are too large.[name] Kitchen  History of Present Illness: Current Problems:  CANDIDIASIS, ORAL (ICD-112.0)  hs of HYPOTHYROIDISM, PRIMARY (ICD-244.9) OTITIS EXTERNA, ACUTE NEC (ICD-380.22) UTI (ICD-599.0)  no current symptoms HYPERLIPIDEMIA (ICD-272.4) needs follow up labs ADVEF, DRUG/MEDICINAL/BIOLOGICAL SUBST NOS (ICD-995.20) FAMILY HISTORY DIABETES 1ST DEGREE RELATIVE (ICD-V18.0) GERD (ICD-530.81) DIVERTICULOSIS, COLON (ICD-562.10)  when to see DR [facility]  for chest pain, reglan helped, but  still has pain, Dr Kinnie Scales did not do an EGD.  HYPERTENSION (ICD-401.9)  with chest pains that are not exertional No shortness of breath awakes in AM with nausis Last time throat stretched by Dr Arlyce Dice and requested referral to [facility] who tok aff the reglan     Dyspepsia History:      The patient has positive alarm features of dyspepsia which include dysphagia.  There is a prior history of GERD.  She notes that there have been breakthrough symptoms despite maximum H-2 blocker or PPI therapy.  The patient does not have a prior history of documented ulcer disease.  The dominant symptom is not heartburn or acid reflux.  An H-2 blocker medication is currently being taken.  She has no history of a positive H. Pylori serology.  A prior EGD has been done which showed moderate or severe esophagitis.       Prior Medication  List:  HYDROCHLOROTHIAZIDE 12.5 MG CAPS (HYDROCHLOROTHIAZIDE) once daily SYNTHROID 88 MCG TABS (LEVOTHYROXINE SODIUM) once daily XANAX 0.25 MG TABS (ALPRAZOLAM) as needed ZEGERID 40-1100 MG  CAPS (OMEPRAZOLE-SODIUM BICARBONATE) one by mouth at bedtime KLOR-CON 10 10 MEQ  TBCR (POTASSIUM CHLORIDE) once daily CALCIUM 500/D 500-200 MG-UNIT  TABS (CALCIUM CARBONATE-VITAMIN D) 2 a day VYTORIN 10-20 MG  TABS (EZETIMIBE-SIMVASTATIN) once daily REGLAN 5 MG  TABS (METOCLOPRAMIDE HCL) one by mouth q AC ZITHROMAX Z-PAK 250 MG  TABS (AZITHROMYCIN) as directed   Current Allergies (reviewed today): ! CODEINE ! CIPRO ! ULTRAM ! RANITIDINE HCL (RANITIDINE HCL)  Past Medical History:    Reviewed history from 10/18/2006 and no changes required:       Hypertension       Diverticulosis, colon       GERD  Past Surgical History:    Reviewed history from 11/13/2006 and no changes required:       Tonsillectomy       Appendectomy       knee surgery bilateral       umbilical hernia   Family History:    Reviewed history from 10/18/2006 and no changes required:       Fam hx Crohn's dz       Fam hx polyps       Family History Diabetes 1st degree relative       Family  Hsitory Headaches       Family History Hypertension  Social History:    Reviewed history from 10/18/2006 and no changes required:       Never Smoked       Alcohol use-no       Regular exercise-yes    Review of Systems  The patient denies anorexia, fever, weight loss, weight gain, vision loss, decreased hearing, hoarseness, chest pain, syncope, dyspnea on exhertion, peripheral edema, prolonged cough, hemoptysis, abdominal pain, melena, hematochezia, severe indigestion/heartburn, hematuria, incontinence, genital sores, muscle weakness, suspicious skin lesions, transient blindness, difficulty walking, depression, unusual weight change, abnormal bleeding, enlarged lymph nodes, angioedema, and breast masses.     Physical Exam   General:     Well-developed,well-nourished,in no acute distress; alert,appropriate and cooperative throughout examination Head:     normocephalic.  atraumatic.   Eyes:     pupils equal and pupils round.  pupils reactive to light.   Ears:     R ear normal and L ear normal.   Nose:     no nasal discharge.  no external erythema.   Mouth:     pharynx pink and moist.  no posterior lymphoid hypertrophy.   Neck:     supple.  full ROM and no masses.   Chest Wall:     No deformities, masses, or tenderness noted.    Impression & Recommendations:  Problem # 1:  GERD (ICD-530.81)  The following medications were removed from the medication list:    Zegerid 40-1100 Mg Caps (Omeprazole-sodium bicarbonate) ..... One by mouth at bedtime  Her updated medication list for this problem includes:    Zantac 300 Mg Tabs (Ranitidine hcl) ..... One by mouth q hs    Nexium 40 Mg Cpdr (Esomeprazole magnesium) ..... One by mouth q am  Diagnostics Reviewed:  Discussed lifestyle modifications, diet, antacids/medications, and preventive measures. Handout provided.  Orders: TLB-CBC Platelet - w/Differential (85025-CBCD)   Problem # 2:  HYPERLIPIDEMIA (ICD-272.4)  Her updated medication list for this problem includes:    Vytorin 10-20 Mg Tabs (Ezetimibe-simvastatin) ..... Once daily  Labs Reviewed: Chol: 200 (11/07/2006)   HDL: 46.9 (11/07/2006)   LDL: DEL (11/07/2006)   TG: 224 (11/07/2006) SGOT: 23 (11/07/2006)   SGPT: 18 (11/07/2006)  Orders: TLB-Lipid Panel (80061-LIPID)   Problem # 3:  HYPERTENSION (ICD-401.9) Assessment: Unchanged  Her updated medication list for this problem includes:    Hydrochlorothiazide 12.5 Mg Caps (Hydrochlorothiazide) ..... Once daily  BP today: 136/70 Prior BP: 130/78 (02/14/2007)  Labs Reviewed: Creat: 0.9 (06/15/2006) Chol: 200 (11/07/2006)   HDL: 46.9 (11/07/2006)   LDL: DEL (11/07/2006)   TG: 224 (11/07/2006)  Orders: TLB-BMP (Basic Metabolic  Panel-BMET) (80048-METABOL)   Problem # 4:  DIVERTICULOSIS, COLON (ICD-562.10)  Labs Reviewed: Hgb: 13.2 (02/14/2007)   Hct: 37.7 (02/14/2007)      Problem # 5:  CHEST PAIN, ATYPICAL (ICD-786.59) atypical chest pain with no associated diaphoresis or exertionla components worrison factors are age and location of pain Orders: Cardiology Referral (Cardiology) for GXT  Problem # 6:  GANGLION OF JOINT (ICD-727.41) schedule aspiation  Medications Added to Medication List This Visit: 1)  Zantac 300 Mg Tabs (Ranitidine hcl) .... One by mouth q hs 2)  Nexium 40 Mg Cpdr (Esomeprazole magnesium) .... One by mouth q am 3)  Reglan 5 Mg Tabs (Metoclopramide hcl) .... One by mouth qhs  Complete Medication List: 1)  Hydrochlorothiazide 12.5 Mg Caps (Hydrochlorothiazide) .... Once daily 2)  Synthroid 88 Mcg Tabs (Levothyroxine  sodium) .... Once daily 3)  Xanax 0.25 Mg Tabs (Alprazolam) .... As needed 4)  Klor-con 10 10 Meq Tbcr (Potassium chloride) .... Once daily 5)  Calcium 500/d 500-200 Mg-unit Tabs (Calcium carbonate-vitamin d) .... 2 a day 6)  Vytorin 10-20 Mg Tabs (Ezetimibe-simvastatin) .... Once daily 7)  Zantac 300 Mg Tabs (Ranitidine hcl) .... One by mouth q hs 8)  Nexium 40 Mg Cpdr (Esomeprazole magnesium) .... One by mouth q am 9)  Reglan 5 Mg Tabs (Metoclopramide hcl) .... One by mouth qhs  Other Orders: TLB-TSH (Thyroid Stimulating Hormone) (84443-TSH) TLB-Hepatic/Liver Function Pnl (80076-HEPATIC) Venipuncture (57846) UA Dipstick w/o Micro (automated)  (81003)  Dyspepsia Assessment/Plan:  Step Therapy: GERD Treatment Protocols:    Step-1: failed    Step-2: failed    Step-3: improved   Patient Instructions: 1)  Please schedule a follow-up appointment in 3-4 weeks.    Prescriptions: REGLAN 5 MG  TABS (METOCLOPRAMIDE HCL) one by mouth QHS  #30 x 11   Entered and Authorized by:   Stacie Glaze MD   Signed by:   Stacie Glaze MD on 06/28/2007   Method used:    Electronically sent to ...       [facility]  [address] [id number]       [address]       [place], [place]  [address]       Ph: [phone] or [phone]       Fax: [phone]   RxID:   [id number] NEXIUM 40 MG  CPDR (ESOMEPRAZOLE MAGNESIUM) one by mouth q AM  #30 x 11   Entered and Authorized by:   Stacie Glaze MD   Signed by:   Stacie Glaze MD on 06/28/2007   Method used:   Electronically sent to ...       [facility]  [address] [id number]       [address]       [place], [place]  [address]       Ph: [phone] or [phone]       Fax: [phone]   RxID:   [id number] ZANTAC 300 MG  TABS (RANITIDINE HCL) one by mouth q HS  #30 x 0   Entered and Authorized by:   Stacie Glaze MD   Signed by:   Stacie Glaze MD on 06/28/2007   Method used:   Electronically sent to ...       [facility]  [address] [id number]       [address]       [place], [place]  [address]       Ph: [phone] or [phone]       Fax: [phone]   RxID:   [id number]  ]  Appended Document: Orders Update    Clinical Lists Changes  Observations: Added new observation of COMMENTS: ..................................................................[name] KitchenWynona Canes, CMA  June 28, 2007 1:56 PM  (06/28/2007 13:55) Added new observation of PH URINE: 7.5  (06/28/2007 13:55) Added new observation of SPEC GR URIN: 1.015  (06/28/2007 13:55) Added new observation of APPEARANCE U: Clear  (06/28/2007 13:55) Added new observation of UA COLOR: yellow  (06/28/2007 13:55) Added new observation of WBC DIPSTK U: 3+  (06/28/2007 13:55) Added new observation of NITRITE URN: negative  (06/28/2007 13:55) Added new observation of UROBILINOGEN: 0.2  (06/28/2007 13:55)  Added new observation of PROTEIN, URN: negative  (06/28/2007 13:55) Added new observation of BLOOD UR DIP: trace-lysed  (06/28/2007 13:55) Added new  observation of KETONES URN: negative  (06/28/2007 13:55) Added new observation of BILIRUBIN UR: negative  (06/28/2007 13:55) Added new observation of GLUCOSE, URN: negative  (06/28/2007 13:55)       Laboratory Results   Urine Tests   Date/Time Reported: June 28, 2007 1:56 PM   Routine Urinalysis   Color: yellow Appearance: Clear Glucose: negative   (Normal Range: Negative) Bilirubin: negative   (Normal Range: Negative) Ketone: negative   (Normal Range: Negative) Spec. Gravity: 1.015   (Normal Range: 1.003-1.035) Blood: trace-lysed   (Normal Range: Negative) pH: 7.5   (Normal Range: 5.0-8.0) Protein: negative   (Normal Range: Negative) Urobilinogen: 0.2   (Normal Range: 0-1) Nitrite: negative   (Normal Range: Negative) Leukocyte Esterace: 3+   (Normal Range: Negative)    Comments: ..................................................................[name] KitchenWynona Canes, CMA  June 28, 2007 1:56 PM

## 2010-05-04 NOTE — Assessment & Plan Note (Signed)
Summary: F/U abd pain , Anemia, saw NP   History of Present Illness Visit Type: Follow-up Visit Primary GI MD: Elie Goody MD [facility] Primary Provider: Darryll Capers, MD Requesting Provider: Darryll Capers, MD Chief Complaint: Abdominal pain has improved, no labs since last visit with Gunnar Fusi History of Present Illness:   Ms. Limbert has had improvement of her lower abdominal pain since her last evaluation. CT scan of the abdomen and pelvis did not reveal any significant GI pathology except for advanced colonic diverticular disease. She was found to have an iron deficiency anemia with Hemoccult negative stool. She reports she had oral surgery and foot surgery in 2010 with both providers reporting that she had more than expected amounts of bleeding with the surgery.  Upper endoscopy January 2010: unremarkable. Colonoscopy March 2007: Diverticulosis and internal hemorrhoids.   GI Review of Systems    Reports abdominal pain.     Location of  Abdominal pain: lower abdomen.    Denies acid reflux, belching, bloating, chest pain, dysphagia with liquids, dysphagia with solids, heartburn, loss of appetite, nausea, vomiting, vomiting blood, weight loss, and  weight gain.        Denies anal fissure, black tarry stools, change in bowel habit, constipation, diarrhea, diverticulosis, fecal incontinence, heme positive stool, hemorrhoids, irritable bowel syndrome, jaundice, light color stool, liver problems, rectal bleeding, and  rectal pain. Current Medications (verified): 1)  Hydrochlorothiazide 12.5 Mg Caps (Hydrochlorothiazide) .... Once Daily 2)  Synthroid 88 Mcg Tabs (Levothyroxine Sodium) .... Once Daily 3)  Xanax 0.25 Mg Tabs (Alprazolam) .... As Needed 4)  Klor-Con 10 10 Meq  Tbcr (Potassium Chloride) .... Once Daily 5)  Calcium 500/d 500-200 Mg-Unit  Tabs (Calcium Carbonate-Vitamin D) .... 2 A Day 6)  Vytorin 10-20 Mg  Tabs (Ezetimibe-Simvastatin) .... Once Daily 7)  Align   Caps (Misc  Intestinal Flora Regulat) .... One By Mouth Daily 8)  Dexilant 60 Mg Cpdr (Dexlansoprazole) .[name] Kitchen.. 1 Once Daily 9)  Klor-Con 8 Meq Cr-Tabs (Potassium Chloride) .[name] Kitchen.. 1 Once Daily 10)  Vitamin D 1200 .[name] Kitchen.. 1 Once Daily 11)  Macrodantin 50 Mg Caps (Nitrofurantoin Macrocrystal) .... Hold 12)  Niferex-Pn Forte  Tabs (Prenatal Vit-Fe Psac Cmplx-Fa) .... One By Mouth Daily ( May Sub For Generic)  Allergies (verified): 1)  ! Codeine 2)  ! Cipro 3)  ! Ultram 4)  ! Pneumovax 23 (Pneumococcal Vac Polyvalent) 5)  ! * Levoquin 6)  ! Bactrim  Past History:  Past Medical History: Hypertension Diverticulosis, colon GERD Esophageal Stricture Colon Polyps- Hyperplastic 06/2005 Anxiety Disorder Hemorrhoids Gastroparesis-49.6% solid retention at 2 hrs.  Anemia, fe deficiency  Past Surgical History: Reviewed history from 03/16/2009 and no changes required. 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 Fam hx Crohn's dz-niece Fam hx polyps-Brother, Sister, daughter Family History Diabetes 1st degree relative Family Hsitory Headaches Family History Hypertension  Social History: Reviewed history from 10/18/2006 and no changes required. Never Smoked Alcohol use-no Regular exercise-yes  Review of Systems       The patient complains of anemia, arthritis/joint pain, back pain, fatigue, hearing problems, and urination changes/pain.  The patient denies allergy/sinus, anxiety-new, blood in urine, breast changes/lumps, change in vision, confusion, cough, coughing up blood, depression-new, fainting, fever, headaches-new, heart murmur, heart rhythm changes, itching, menstrual pain,  muscle pains/cramps, night sweats, nosebleeds,  pregnancy symptoms, shortness of breath, skin rash, sleeping problems, sore throat, swelling of feet/legs, swollen lymph glands, thirst - excessive , urination - excessive , urine leakage, vision changes, and voice change.    Vital Signs:  Patient profile:   77 year old female Height:      64 inches Weight:      125.25 pounds BMI:     21.58 Pulse rate:   72 / minute Pulse rhythm:   regular BP sitting:   114 / 72  (left arm) Cuff size:   regular  Vitals Entered By: June McMurray CMA Duncan Dull) (May 06, 2009 2:29 PM)  Physical Exam  General:  Well developed, well nourished, no acute distress. Head:  Normocephalic and atraumatic. Eyes:  PERRLA, no icterus. Mouth:  No deformity or lesions, dentition normal. Lungs:  Clear throughout to auscultation. Heart:  Regular rate and rhythm; no murmurs, rubs,  or bruits. Abdomen:  Soft, nontender and nondistended. No masses, hepatosplenomegaly or hernias noted. Normal bowel sounds. Rectal:  deferred until time of colonoscopy.  recent stool Hemoccults negative. Psych:  Alert and cooperative. Normal mood and affect.  Impression & Recommendations:  Problem # 1:  ANEMIA, IRON DEFICIENCY (ICD-280.9) Assessment New Rule out occult gastrointestinal losses. Rule out celiac disease. Hemoccult-negative stool. The risks, benefits and alternatives to colonoscopy with possible biopsy and possible polypectomy were discussed with the patient and they consent to proceed. The procedure will be scheduled electively. The risks, benefits and alternatives to endoscopy with possible biopsy and possible dilation were discussed with the patient and they consent to proceed. The procedure will be scheduled electively. If EGD and colonoscopy are unrevealing for a source of blood loss consider capsule endoscopy. Orders: TLB-CBC Platelet - w/Differential (85025-CBCD) TLB-IgA (Immunoglobulin A) (82784-IGA) T-Sprue Panel (Celiac Disease Aby Eval)  (83516x3/[address]) Colon/Endo (Colon/Endo)  Problem # 2:  GERD (ICD-530.81) Assessment: Unchanged Continue current PPI and standard antireflux measures. Orders: Colon/Endo (Colon/Endo)  Problem # 3:  DIVERTICULOSIS, COLON (ICD-562.10) Long-term high fiber diet with adequate daily water intake.  Problem # 4:  GASTROPARESIS (ICD-536.3) Continue standard dietary adjustments.  Patient Instructions: 1)  Colonoscopy brochure given.  2)  Upper Endoscopy brochure given.  3)  Get your labs drawn today in the basement.  4)  Copy sent to : Darryll Capers, MD 5)  The medication list was reviewed and reconciled.  All changed / newly prescribed medications were explained.  A complete medication list was provided to the patient / caregiver.  Prescriptions: MOVIPREP 100 GM  SOLR (PEG-KCL-NACL-NASULF-NA ASC-C) As per prep instructions.  #1 x 0   Entered by:   Christie Nottingham CMA (AAMA)   Authorized by:   Meryl Dare MD [facility]   Signed by:   Meryl Dare MD [facility] on 05/06/2009   Method used:   Electronically to        [id number]* (retail)       [address]       [place], [place]  [address]       Ph: [phone] or [phone]       Fax: [phone]   RxID:   [id number]

## 2010-05-04 NOTE — Letter (Signed)
Summary: Appt Reminder 2  [facility]  [address] [place], [place] [address]   Phone: [phone]  Fax: [phone]        May 13, 2009 MRN: [id number]    Rebecca [name] [address] [place], [place]  [address]    Dear Ms. Sermon,   You have a return appointment with Dr. Russella Dar on 06-10-09 at 1:30 pm.  Please remember to bring a complete list of the medicines you are taking, your insurance card and your co-pay.  If you have to cancel or reschedule this appointment, please call before 5:00 pm the evening before to avoid a cancellation fee.  If you have any questions or concerns, please call [phone].    Sincerely,    Darcey Nora RN, CGRN  Appended Document: Appt Reminder 2 letter mailed to patient's home

## 2010-05-04 NOTE — Assessment & Plan Note (Signed)
Summary: 1 month rov/njr   Vital Signs:  Patient Profile:   77 Years Old Female Height:     64 inches Weight:      124 pounds Temp:     98.2 degrees F oral Pulse rate:   72 / minute Resp:     14 per minute BP sitting:   130 / 80  Vitals Entered By: Willy Eddy, LPN (Aug 17, 2007 3:46 PM)                 Chief Complaint:  roa gerd--somebetter and Heartburn.  History of Present Illness: GERD improved with the align, still nausiated in the AM, still has indigestion.[name] Kitchen adter eating better but after several hours begins to hurt  Heartburn      This is a 77 year old woman who presents with Heartburn.  The patient reports acid reflux, sour taste in mouth, and epigastric pain, but denies trouble swallowing and weight loss.  The patient denies the following alarm features: melena, dysphagia, hematemesis, vomiting, involuntary weight loss >5%, and history of anemia.  Symptoms are worse with spicy foods and lying down.  Prior evaluation has included EGD.  The patient has found the following treatments to be effective: an H2 blocker and a PPI.      Current Allergies: ! CODEINE ! CIPRO ! ULTRAM ! RANITIDINE HCL (RANITIDINE HCL)        Impression & Recommendations:  Problem # 1:  GERD (ICD-530.81)  Her updated medication list for this problem includes:    Zantac 300 Mg Tabs (Ranitidine hcl) ..... One by mouth q hs    Nexium 40 Mg Cpdr (Esomeprazole magnesium) ..... One by mouth q am  Diagnostics Reviewed:  Discussed lifestyle modifications, diet, antacids/medications, and preventive measures. Handout provided.   Problem # 2:  HYPERLIPIDEMIA (ICD-272.4)  Her updated medication list for this problem includes:    Vytorin 10-20 Mg Tabs (Ezetimibe-simvastatin) ..... Once daily  Labs Reviewed: Chol: 217 (06/28/2007)   HDL: 52.1 (06/28/2007)   LDL: DEL (06/28/2007)   TG: 213 (06/28/2007) SGOT: 23 (06/28/2007)   SGPT: 17 (06/28/2007)  Prior 10 Yr Risk Heart Disease: Not  enough information (07/09/2007)   Problem # 3:  HYPOTHYROIDISM, PRIMARY (ICD-244.9)  Her updated medication list for this problem includes:    Synthroid 88 Mcg Tabs (Levothyroxine sodium) ..... Once daily  Labs Reviewed: TSH: 3.10 (06/28/2007)    HgBA1c: 5.6 (07/26/2006) Chol: 217 (06/28/2007)   HDL: 52.1 (06/28/2007)   LDL: DEL (06/28/2007)   TG: 213 (06/28/2007)   Problem # 4:  ABDOMINAL PAIN, GENERALIZED (ICD-789.07)  Her updated medication list for this problem includes:    Reglan 5 Mg Tabs (Metoclopramide hcl) .[name] Kitchen... 1/2 two times a day Discussed symptom control with the patient.  Orders: Radiology Referral (Radiology)   Problem # 5:  GANGLION OF TENDON SHEATH (ICD-727.42)  Orders: Joint Aspirate / Injection, Intermediate ([address])   Complete Medication List: 1)  Hydrochlorothiazide 12.5 Mg Caps (Hydrochlorothiazide) .... Once daily 2)  Synthroid 88 Mcg Tabs (Levothyroxine sodium) .... Once daily 3)  Xanax 0.25 Mg Tabs (Alprazolam) .... As needed 4)  Klor-con 10 10 Meq Tbcr (Potassium chloride) .... Once daily 5)  Calcium 500/d 500-200 Mg-unit Tabs (Calcium carbonate-vitamin d) .... 2 a day 6)  Vytorin 10-20 Mg Tabs (Ezetimibe-simvastatin) .... Once daily 7)  Zantac 300 Mg Tabs (Ranitidine hcl) .... One by mouth q hs 8)  Nexium 40 Mg Cpdr (Esomeprazole magnesium) .... One by mouth q am 9)  Reglan 5 Mg Tabs (Metoclopramide hcl) .... 1/2 two times a day   Patient Instructions: 1)  Please schedule a follow-up appointment in 1 month.   ]

## 2010-05-04 NOTE — Letter (Signed)
Summary: [facility]   Imported By: Maryln Gottron 05/05/2009 15:33:32  _____________________________________________________________________  External Attachment:    Type:   Image     Comment:   External Document

## 2010-05-04 NOTE — Progress Notes (Signed)
Summary: ? re prep   Phone Note Call from Patient Call back at [phone]   Caller: Patient Call For: Russella Dar Summary of Call: Patient has questions regarding prep instructions Initial call taken by: Tawni Levy,  May 11, 2009 8:56 AM  Follow-up for Phone Call        Questions answered regarding prep times.  Follow-up by: Christie Nottingham CMA Duncan Dull),  May 11, 2009 9:17 AM

## 2010-05-04 NOTE — Progress Notes (Signed)
Summary: Flector patches  Phone Note Call from Patient   Caller: Patient Call For: Stacie Glaze MD Reason for Call: Acute Illness Summary of Call: Pt is having a flare up of arthritis in her neck, and would like Dr. Lovell Sheehan to RX Flector Patches.  Ortho gave these to her at one time, and they worked really well. [phone] [facility]) Initial call taken by: Lynann Beaver CMA,  June 16, 2009 10:13 AM    New/Updated Medications: FLECTOR 1.3 % PTCH (DICLOFENAC EPOLAMINE) Use as directed Prescriptions: FLECTOR 1.3 % PTCH (DICLOFENAC EPOLAMINE) Use as directed  #1 box x 3   Entered by:   Willy Eddy, LPN   Authorized by:   Stacie Glaze MD   Signed by:   Willy Eddy, LPN on 90/24/0973   Method used:   Electronically to        [facility]  [address] [id number]* (retail)       [address]       [place], [place]  [address]       Ph: [phone] or [phone]       Fax: [phone]   RxID:   [id number]

## 2010-05-04 NOTE — Op Note (Signed)
Summary: [facility]  [facility]   Imported By: Sherian Rein 02/16/2010 13:47:59  _____________________________________________________________________  External Attachment:    Type:   Image     Comment:   External Document

## 2010-05-04 NOTE — Assessment & Plan Note (Signed)
Summary: UTI?/KDC   Vital Signs:  Patient profile:   77 year old female Weight:      125 pounds Temp:     97.6 degrees F oral Pulse rate:   60 / minute Pulse rhythm:   regular BP sitting:   160 / 90  (right arm) Cuff size:   regular  Vitals Entered By: Lowella Petties CMA (May 02, 2009 12:53 PM) CC: Pain and burning with urination.   Primary Care Provider:  Darryll Capers, MD  CC:  Pain and burning with urination.[name] Kitchen  History of Present Illness: 77 y/o fem w one days h/o dysuria.  Nofever etc.  Just finished 7 days of augmentin by Dr. Thomasena Edis for uti..u/a normal but culture +.......?bact.  seeing uro Dr. Earlene Plater in 10 days for eval.  Allergies: 1)  ! Codeine 2)  ! Cipro 3)  ! Ultram 4)  ! Pneumovax 23 (Pneumococcal Vac Polyvalent) 5)  ! * Levoquin 6)  ! Bactrim  Past History:  Past medical, surgical, family and social histories (including risk factors) reviewed for relevance to current acute and chronic problems.  Past Medical History: Reviewed history from 03/16/2009 and no changes required. Hypertension Diverticulosis, colon GERD Esophageal Stricture Colon Polyps- Hyperplastic 06/2005 Anxiety Disorder Hemorrhoids Gastroparesis-49.6% solid retention at 2 hrs.  Anemia  Past Surgical History: Reviewed history from 03/16/2009 and no changes required. 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 Fam hx Crohn's dz-niece Fam hx polyps-Brother, Sister, daughter Family History Diabetes 1st degree relative Family Hsitory Headaches Family History Hypertension  Social History: Reviewed history from 10/18/2006 and no changes required. Never Smoked Alcohol use-no Regular exercise-yes  Review  of Systems      See HPI  Physical Exam  General:  Well-developed,well-nourished,in no acute distress; alert,appropriate and cooperative throughout examination Abdomen:  Bowel sounds positive,abdomen soft and non-tender without masses, organomegaly or hernias noted.   Impression & Recommendations:  Problem # 1:  DYSURIA (ICD-788.1) Assessment New  Her updated medication list for this problem includes:    Macrodantin 50 Mg Caps (Nitrofurantoin macrocrystal) ..... Hold  Orders: Specimen Handling (87564) T-Culture, Urine ([id number])  Complete Medication List: 1)  Hydrochlorothiazide 12.5 Mg Caps (Hydrochlorothiazide) .... Once daily 2)  Synthroid 88 Mcg Tabs (Levothyroxine sodium) .... Once daily 3)  Xanax 0.25 Mg Tabs (Alprazolam) .... As needed 4)  Klor-con 10 10 Meq Tbcr (Potassium chloride) .... Once daily 5)  Calcium 500/d 500-200 Mg-unit Tabs (Calcium carbonate-vitamin d) .... 2 a day 6)  Vytorin 10-20 Mg Tabs (Ezetimibe-simvastatin) .... Once daily 7)  Align Caps (Misc intestinal flora regulat) .... One by mouth daily 8)  Dexilant 60 Mg Cpdr (Dexlansoprazole) .[name] Kitchen.. 1 once daily 9)  Reglan 5 Mg Tabs (Metoclopramide hcl) .[name] Kitchen.. 1 before dinner prn 10)  Klor-con 8 Meq Cr-tabs (Potassium chloride) .[name] Kitchen.. 1 once daily 11)  Vitamin D 1200  .[name] Kitchen.. 1 once daily 12)  Macrodantin 50 Mg Caps (Nitrofurantoin macrocrystal) .... Hold 13)  Niferex-pn Forte Tabs (Prenatal vit-fe psac cmplx-fa) .... One by mouth daily ( may sub for generic)  Patient Instructions: 1)  30 oz of h2o daily,  cranberry juice 4 0z 3 x day, and pyridium 3 x day as needed.  Call Dr. Mearl Latin. for urine culture report  Laboratory Results   Urine  Tests  Date/Time Received: May 02, 2009 1:00 PM  Date/Time Reported: May 02, 2009 1:00 PM   Routine Urinalysis   Color: orange Appearance: Clear Glucose: negative   (Normal Range: Negative) Bilirubin: negative   (Normal Range: Negative) Ketone: negative    (Normal Range: Negative) Spec. Gravity: 1.010   (Normal Range: 1.003-1.035) Blood: negative   (Normal Range: Negative) pH: 6.0   (Normal Range: 5.0-8.0) Protein: negative   (Normal Range: Negative) Urobilinogen: 0.2   (Normal Range: 0-1) Nitrite: negative   (Normal Range: Negative) Leukocyte Esterace: negative   (Normal Range: Negative)    Comments: Strip was discolored due to pyridium, difficult to read.

## 2010-05-04 NOTE — Progress Notes (Signed)
Summary: back pain  Phone Note Call from Patient   Caller: Patient Call For: Dr. Lovell Sheehan Complaint: Nausea/Vomiting/Diarrhea Summary of Call: Pt. calls stating she has lower back pain...[name] KitchenMarland KitchenMarland Kitchenx one week.  Would like a muscle relaxer, and NSAID called to [facility]) [phone]  Initial call taken by: Lynann Beaver CMA,  November 12, 2007 9:53 AM    New/Updated Medications: DICLOFENAC SODIUM 75 MG  TBEC (DICLOFENAC SODIUM) 1 two times a day for 10 days ROBAXIN 500 MG  TABS (METHOCARBAMOL) 1 two times a day for 10 days   Prescriptions: ROBAXIN 500 MG  TABS (METHOCARBAMOL) 1 two times a day for 10 days  #20 x 0   Entered by:   Willy Eddy, LPN   Authorized by:   Stacie Glaze MD   Signed by:   Willy Eddy, LPN on 45/40/9811   Method used:   Electronically sent to ...       [facility]  [address] [id number]*       [address]       [place], [place]  [address]       Ph: [phone] or [phone]       Fax: [phone]   RxID:   [id number] DICLOFENAC SODIUM 75 MG  TBEC (DICLOFENAC SODIUM) 1 two times a day for 10 days  #20 x 0   Entered by:   Willy Eddy, LPN   Authorized by:   Stacie Glaze MD   Signed by:   Willy Eddy, LPN on 59/56/3875   Method used:   Electronically sent to ...       [facility]  [address] [id number]*       [address]       [place], [place]  [address]       Ph: [phone] or [phone]       Fax: [phone]   RxID:   [id number]    Appended Document: back pain per dr Lovell Sheehan call in robaxin 500 two times a day and voltaren 75 two times a day for 10 days. pti nformed and med sent inthrum emr

## 2010-05-04 NOTE — Miscellaneous (Signed)
Summary: Orders Update  Clinical Lists Changes  Orders: Added new Test order of Arterial Duplex Lower Extremity (Arterial Duplex Low) - Signed 

## 2010-05-04 NOTE — Procedures (Signed)
Summary: Colonoscopy  Patient: Rebecca [name] Note: All result statuses are Final unless otherwise noted.  Tests: (1) Colonoscopy (COL)   COL Colonoscopy           DONE     [facility]     [address]     [place], [place]  [address]           COLONOSCOPY PROCEDURE REPORT           PATIENT:  Rebecca, [name]  MR#:  [id number]     BIRTHDATE:  1932-05-05, 76 yrs. old  GENDER:  female           ENDOSCOPIST:  Judie Petit T. Russella Dar, MD, [facility]           PROCEDURE DATE:  05/12/2009     PROCEDURE:  Colonoscopy with biopsy and snare polypectomy     ASA CLASS:  Class II     INDICATIONS:  1) iron deficiency anemia           MEDICATIONS:   Fentanyl 100 mcg IV, Versed 10 mg IV           DESCRIPTION OF PROCEDURE:   After the risks benefits and     alternatives of the procedure were thoroughly explained, informed     consent was obtained.  Digital rectal exam was performed and     revealed no abnormalities.   The LB [id number] endoscope     was introduced through the anus and advanced to the cecum, which     was identified by both the appendix and ileocecal valve, without     limitations.  The quality of the prep was good, using MoviPrep.     The instrument was then slowly withdrawn as the colon was fully     examined.     <<PROCEDUREIMAGES>>           FINDINGS:  A sessile polyp was found in the descending colon. It     was 5 mm in size. Polyp was snared without cautery. Retrieval was     successful.  A sessile polyp was found in the rectum. It was 3 mm     in size. The polyp was removed using cold biopsy forceps.  A     sessile polyp was found in the sigmoid colon. It was 5 mm in size.     Polyp was snared without cautery. Retrieval was successful. Polyp     was associated with diverticulosis.  Moderate diverticulosis was     found sigmoid to descending.  Diverticulitis was found in the     sigmoid colon.  Colitis was found in the distal sigmoid colon. It     was focal and  erythematous. Multiple biopsies were obtained and     sent to pathology.  An ulcer was found in the mid rectum. It was     erythematous, friable and focal. Multiple biopsies were obtained     and sent to pathology. This was otherwise a normal examination of     the colon. Retroflexed views in the rectum revealed internal     hemorrhoids, small.  The time to cecum =  4  minutes. The scope     was then withdrawn (time =  14.5  min) from the patient and the     procedure completed.           COMPLICATIONS:  None           ENDOSCOPIC IMPRESSION:  1) 5 mm sessile polyp in the descending colon     2) 3 mm sessile polyp in the rectum     3) 5 mm sessile polyp in the sigmoid colon     4) Moderate diverticulosis     5) Diverticulitis in the sigmoid colon     6) Focal colitis in the distal sigmoid colon     7) Rectal ulcer     8) Internal hemorrhoids           RECOMMENDATIONS:     1) Await pathology results     2) High fiber diet     3) If the polyps removed today are adenomatous (pre-cancerous),     you will need a repeat colonoscopy in 5 years. Otherwise you     should continue to follow colorectal cancer screening guidelines     for "routine risk" patients with colonoscopy in 10 years.4)     Augmentin 500 po tid with meals, #21     5) Office visit in 4 weeks           Shamar Engelmann T. Russella Dar, MD, [facility]           CC: Stacie Glaze, MD           n.     Rosalie DoctorVenita Lick. Loann Chahal at 05/12/2009 02:33 PM           Wanda Plump, [id number]  Note: An exclamation mark (!) indicates a result that was not dispersed into the flowsheet. Document Creation Date: 05/12/2009 2:34 PM _______________________________________________________________________  (1) Order result status: Final Collection or observation date-time: 05/12/2009 14:24 Requested date-time:  Receipt date-time:  Reported date-time:  Referring Physician:   Ordering Physician: Claudette Head [phone]) Specimen Source:    Source: Launa Grill Order Number: [id number] Lab site:   Appended Document: Colonoscopy     Procedures Next Due Date:    Colonoscopy: 05/2014

## 2010-05-04 NOTE — Miscellaneous (Signed)
Summary: augmentin rx.  Clinical Lists Changes  Medications: Added new medication of AUGMENTIN 875-125 MG  TABS (AMOXICILLIN-POT CLAVULANATE) take 1 tab by mouth three times a day with meals. - Signed Rx of AUGMENTIN 875-125 MG  TABS (AMOXICILLIN-POT CLAVULANATE) take 1 tab by mouth three times a day with meals.;  #21 x 0;  Signed;  Entered by: Darlyn Read RN;  Authorized by: Meryl Dare MD [facility];  Method used: Electronically to [facility]. # [id number]*, [address] [place], [place], [place]  [address], Ph: [phone] or [phone], Fax: [phone]    Prescriptions: AUGMENTIN 875-125 MG  TABS (AMOXICILLIN-POT CLAVULANATE) take 1 tab by mouth three times a day with meals.  #21 x 0   Entered by:   Darlyn Read RN   Authorized by:   Meryl Dare MD [facility]   Signed by:   Darlyn Read RN on 05/12/2009   Method used:   Electronically to        [facility] [id number]* (retail)       [address]       [place], [place]  [address]       Ph: [phone] or [phone]       Fax: [phone]   RxID:   [id number]

## 2010-05-04 NOTE — Assessment & Plan Note (Signed)
Summary: CONSULT RE: NEUROPATHY IN FOOT/CJR   Vital Signs:  Patient profile:   77 year old female Height:      64 inches Weight:      122 pounds BMI:     21.02 Temp:     98.2 degrees F oral Pulse rate:   68 / minute Resp:     14 per minute BP sitting:   140 / 80  (left arm)  Vitals Entered By: Willy Eddy, LPN (September 22, 2009 12:16 PM) CC: to orth with dx of neuropathy in feet and was given neurtonin 100 qd--to discuss   Primary Care Provider:  Darryll Capers, MD  CC:  to orth with dx of neuropathy in feet and was given neurtonin 100 qd--to discuss.  History of Present Illness: the pt saw a foot specialist and was diagnosed with neuropaty risk are low not a DM, never smoked anemia ( b12) and PAD? the numbness started after surgery mostly in the right but both are beginig to effect    Preventive Screening-Counseling & Management  Alcohol-Tobacco     Smoking Status: never  Current Problems (verified): 1)  Preventive Health Care  (ICD-V70.0) 2)  Adenomatous Colonic Polyp  (ICD-211.3) 3)  Diverticulitis-colon  (ICD-562.11) 4)  Anemia, Iron Deficiency  (ICD-280.9) 5)  Dysuria  (ICD-788.1) 6)  Nonspecific Abn Finding Rad & Oth Exam Gi Tract  (ICD-793.4) 7)  Diarrhea-presumed Infectious  (ICD-009.3) 8)  Diarrhea  (ICD-787.91) 9)  Anemia  (ICD-285.9) 10)  Carotid Artery Disease  (ICD-433.10) 11)  Gastroparesis  (ICD-536.3) 12)  Allergic Rhinitis Cause Unspecified  (ICD-477.9) 13)  Screening Colorectal-cancer  (ICD-V76.51) 14)  Dysphagia  (ICD-787.29) 15)  Ganglion of Tendon Sheath  (ICD-727.42) 16)  Weight Loss, Abnormal  (ICD-783.21) 17)  Abdominal Pain, Generalized  (ICD-789.07) 18)  Ganglion of Joint  (ICD-727.41) 19)  Chest Pain, Atypical  (ICD-786.59) 20)  Candidiasis, Oral  (ICD-112.0) 21)  Hypothyroidism, Primary  (ICD-244.9) 22)  Hyperlipidemia  (ICD-272.4) 23)  Advef, Drug/medicinal/biological Subst Nos  (ICD-995.20) 24)  Family History Diabetes 1st  Degree Relative  (ICD-V18.0) 25)  Gerd  (ICD-530.81) 26)  Diverticulosis, Colon  (ICD-562.10) 27)  Hypertension  (ICD-401.9)  Current Medications (verified): 1)  Synthroid 88 Mcg Tabs (Levothyroxine Sodium) .... Once Daily 2)  Xanax 0.25 Mg Tabs (Alprazolam) .... As Needed 3)  Calcium 500/d 500-200 Mg-Unit  Tabs (Calcium Carbonate-Vitamin D) .... 2 A Day 4)  Vytorin 10-20 Mg  Tabs (Ezetimibe-Simvastatin) .... Once Daily 5)  Align   Caps (Misc Intestinal Flora Regulat) .... One By Mouth Daily 6)  Dexilant 60 Mg Cpdr (Dexlansoprazole) .[name] Kitchen.. 1 Once Daily 7)  Vitamin D 1200 .[name] Kitchen.. 1 Once Daily 8)  Voltaren 1 % Gel (Diclofenac Sodium) .... Use As Directed 9)  Gabapentin 100 Mg Caps (Gabapentin) .[name] Kitchen.. 1 Once Daily  Allergies (verified): 1)  ! Codeine 2)  ! Cipro 3)  ! Ultram 4)  ! Pneumovax 23 (Pneumococcal Vac Polyvalent) 5)  ! * Levoquin 6)  ! Bactrim  Past History:  Family History: Last updated: 06/10/2009 Fam hx Crohn's dz-niece Fam hx polyps-Brother, Sister, daughter Family History Diabetes 1st degree relative Family Hsitory Headaches Family History Hypertension Family History of Colon Cancer: ? MGM, and maternal aunt   Social History: Last updated: 06/10/2009 Retired  Never Smoked Alcohol use-no Regular exercise-yes  Risk Factors: Exercise: yes (10/18/2006)  Risk Factors: Smoking Status: never (09/22/2009)  Past medical, surgical, family and social histories (including risk factors) reviewed, and no changes noted (except  as noted below).  Past Medical History: Reviewed history from 06/10/2009 and no changes required. Hypertension Diverticulosis, diverticulitis Esophageal Stricture Colon Polyps- Hyperplastic 06/2005 Anxiety Disorder Hemorrhoids Gastroparesis-49.6% solid retention at 2 hrs.  Anemia, fe deficiency  Past Surgical History: Reviewed history from 03/16/2009 and no changes required. 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 Fam hx Crohn's dz-niece Fam hx polyps-Brother, Sister, daughter Family History Diabetes 1st degree relative Family Hsitory Headaches Family History Hypertension Family History of Colon Cancer: ? MGM, and maternal aunt   Social History: Reviewed history from 06/10/2009 and no changes required. Retired  Never Smoked Alcohol use-no Regular exercise-yes  Review of Systems  The patient denies anorexia, fever, weight loss, weight gain, vision loss, decreased hearing, hoarseness, chest pain, syncope, dyspnea on exertion, peripheral edema, prolonged cough, headaches, hemoptysis, abdominal pain, melena, hematochezia, severe indigestion/heartburn, hematuria, incontinence, genital sores, muscle weakness, suspicious skin lesions, transient blindness, difficulty walking, depression, unusual weight change, abnormal bleeding, enlarged lymph nodes, angioedema, and breast masses.    Physical Exam  General:  Well-developed,well-nourished,in no acute distress; alert,appropriate and cooperative throughout examination Head:  normocephalic and atraumatic.   Eyes:  pupils equal and pupils round.   Ears:  R ear normal and L ear normal.   Mouth:  No deformity or lesions, dentition normal. Neck:  No deformities, masses, or tenderness noted. Lungs:  normal respiratory effort and no wheezes.   Heart:  normal rate and regular rhythm.   Abdomen:  Bowel sounds positive,abdomen soft and non-tender without masses, organomegaly or hernias noted. Msk:  no joint swelling and no joint warmth.   Extremities:  No clubbing, cyanosis, edema, or deformity noted with normal full range of motion of all joints.   Neurologic:  cranial nerves II-XII intact and DTRs  symmetrical and normal.     Impression & Recommendations:  Problem # 1:  ANEMIA (ICD-285.9)  Hgb: 12.5 (08/21/2009)   Hct: 36.8 (08/21/2009)   Platelets: 320.0 (08/21/2009) RBC: 3.85 (08/21/2009)   RDW: 14.1 (08/21/2009)   WBC: 8.2 (08/21/2009) MCV: 95.5 (08/21/2009)   MCHC: 34.0 (08/21/2009) Ferritin: 26.0 (03/16/2009) Iron: 79 (08/21/2009)   % Sat: 22.9 (08/21/2009) B12: 787 (03/16/2009)   Folate: >20.0 ng/mL (03/16/2009)   TSH: 0.65 (08/21/2009)  Problem # 2:  HYPOTHYROIDISM, PRIMARY (ICD-244.9)  Her updated medication list for this problem includes:    Synthroid 88 Mcg Tabs (Levothyroxine sodium) ..... 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 Once daily 2)  Xanax 0.25 Mg Tabs (Alprazolam) .... As needed 3)  Calcium 500/d 500-200 Mg-unit Tabs (Calcium carbonate-vitamin d) .... 2 a day 4)  Vytorin 10-20 Mg Tabs (  Ezetimibe-simvastatin) .... Once daily 5)  Align Caps (Misc intestinal flora regulat) .... One by mouth daily 6)  Dexilant 60 Mg Cpdr (Dexlansoprazole) .[name] Kitchen.. 1 once daily 7)  Vitamin D 1200  .[name] Kitchen.. 1 once daily 8)  Voltaren 1 % Gel (Diclofenac sodium) .... Use as directed 9)  Gabapentin 100 Mg Caps (Gabapentin) .[name] Kitchen.. 1 once daily  Patient Instructions: 1)  Please schedule a follow-up appointment in  1 month.   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 Patient Level IV [96045] 2)  Neurology Referral [Neuro] 3)  Doppler Referral [Doppler] 4)  Admin of Therapeutic Inj  intramuscular or subcutaneous [96372] 5)  Vit B12 1000 mcg [J3420]

## 2010-05-04 NOTE — Progress Notes (Signed)
  Phone Note Call from Patient   Caller: Patient Call For: Stacie Glaze MD Summary of Call: Pt's insurance will not pay for Flexor Patches.  She would like to change it to Voltaren Gel. Initial call taken by: Lynann Beaver CMA,  June 16, 2009 4:45 PM    New/Updated Medications: VOLTAREN 1 % GEL (DICLOFENAC SODIUM) Use as directed Prescriptions: VOLTAREN 1 % GEL (DICLOFENAC SODIUM) Use as directed  #1 tube x 1   Entered by:   Willy Eddy, LPN   Authorized by:   Stacie Glaze MD   Signed by:   Willy Eddy, LPN on 16/01/9603   Method used:   Electronically to        [facility]  [address] [facility]* (retail)       [address]       [place], [place]  [address]       Ph: [phone] or [phone]       Fax: [phone]   RxID:   [id number]

## 2010-05-04 NOTE — Assessment & Plan Note (Signed)
Summary: SHINGLES SHOT/CCM   Nurse Visit    Prior Medications: HYDROCHLOROTHIAZIDE 12.5 MG CAPS (HYDROCHLOROTHIAZIDE) once daily SYNTHROID 88 MCG TABS (LEVOTHYROXINE SODIUM) once daily XANAX 0.25 MG TABS (ALPRAZOLAM) as needed KLOR-CON 10 10 MEQ  TBCR (POTASSIUM CHLORIDE) once daily CALCIUM 500/D 500-200 MG-UNIT  TABS (CALCIUM CARBONATE-VITAMIN D) 2 a day VYTORIN 10-20 MG  TABS (EZETIMIBE-SIMVASTATIN) once daily ZANTAC 300 MG  TABS (RANITIDINE HCL) one by mouth q HS NEXIUM 40 MG  CPDR (ESOMEPRAZOLE MAGNESIUM) one by mouth q AM REGLAN 5 MG  TABS (METOCLOPRAMIDE HCL) 1/2 two times a day ALIGN   CAPS (MISC INTESTINAL FLORA REGULAT) one by mouth daily Current Allergies: ! CODEINE ! CIPRO ! ULTRAM ! RANITIDINE HCL (RANITIDINE HCL)   Zostavax # 1    Vaccine Type: Zostavax    Site: left deltoid    Mfr: Merck    Dose: 0.5 ml    Route: [place]    Given by: Willy Eddy, LPN    Exp. Date: 01/18/2009    Lot #: [id number]   Orders Added: 1)  Zoster (Shingles) Vaccine Live [90736] 2)  Admin 1st Vaccine Mishka.Peer    ]

## 2010-05-04 NOTE — Progress Notes (Signed)
Summary: sinus infection  Medications Added ZITHROMAX Z-PAK 250 MG  TABS (AZITHROMYCIN) as directed       Phone Note Call from Patient   Caller: Patient Call For: Dr. Lovell Sheehan Reason for Call: Insurance Question Summary of Call: Pt. would like a Zpack for a sinus infection called to [facility]) . [phone] Initial call taken by: Lynann Beaver CMA,  June 01, 2007 9:14 AM  Follow-up for Phone Call        ok to fgive z pack per dr Johnice Riebe//pt informed and script sent Follow-up by: Willy Eddy, LPN,  June 01, 2007 9:55 AM    New/Updated Medications: ZITHROMAX Z-PAK 250 MG  TABS (AZITHROMYCIN) as directed   Prescriptions: ZITHROMAX Z-PAK 250 MG  TABS (AZITHROMYCIN) as directed  #1 x 0   Entered by:   Willy Eddy, LPN   Authorized by:   Stacie Glaze MD   Signed by:   Willy Eddy, LPN on 88/41/6606   Method used:   Electronically sent to ...       [facility]  [address] [facility]*       [address]       [place], [place]  [address]       Ph: [phone] or [phone]       Fax: [phone]   RxID:   [id number]

## 2010-05-04 NOTE — Procedures (Signed)
Summary: Upper Endoscopy  Patient: Rebecca [name] Note: All result statuses are Final unless otherwise noted.  Tests: (1) Upper Endoscopy (EGD)   EGD Upper Endoscopy       DONE     [facility]     [address]     [place], [place]  [address]           ENDOSCOPY PROCEDURE REPORT           PATIENT:  Rebecca [name], Rebecca [name]  MR#:  [id number]     BIRTHDATE:  Aug 27, 1932, 76 yrs. old  GENDER:  female           PROCEDURE DATE:  05/12/2009     PROCEDURE:  EGD with biopsy     ASA CLASS:  Class II     INDICATIONS:  GERD, iron deficiency anemia           MEDICATIONS:  There was residual sedation effect present from     prior procedure, Versed 3 mg IV     TOPICAL ANESTHETIC:  Exactacain Spray           DESCRIPTION OF PROCEDURE:   After the risks benefits and     alternatives of the procedure were thoroughly explained, informed     consent was obtained.  The LB GIF-H180 [id number] endoscope was     introduced through the mouth and advanced to the second portion of     the duodenum, without limitations.  The instrument was slowly     withdrawn as the mucosa was fully examined.     <<PROCEDUREIMAGES>>           Two polyps were found in the body of the stomach. They were 2 - 4     mm in size. Biopsies were obtained of both polyps and sent to     pathology.  The esophagus and gastroesophageal junction were     completely normal in appearance. The duodenal bulb was normal in     appearance, as was the postbulbar duodenum. Random biopsies were     obtained and sent to pathology.  Otherwise the examination was     normal. Retroflexed views revealed no abnormalities.  The scope     was then withdrawn from the patient and the procedure completed.           COMPLICATIONS:  None           ENDOSCOPIC IMPRESSION:     1) 2 - 4 mm, Two polyps in the body of the stomach           RECOMMENDATIONS:     1) await pathology results     2) anti-reflux regimen     3) continue PPI           Sayyid Harewood  T. Russella Dar, MD, Clementeen Graham           CC:  Stacie Glaze, MD           n.     Rebecca DoctorVenita Lick. Leray [name] at 05/12/2009 02:44 PM           Rebecca [name], [id number]  Note: An exclamation mark (!) indicates a result that was not dispersed into the flowsheet. Document Creation Date: 05/13/2009 10:18 AM _______________________________________________________________________  (1) Order result status: Final Collection or observation date-time: 05/12/2009 14:39 Requested date-time:  Receipt date-time:  Reported date-time:  Referring Physician:   Ordering Physician: Claudette Head [phone]) Specimen Source:  Source: Launa Grill Order Number: [id number]  Lab site:

## 2010-05-04 NOTE — Letter (Signed)
Summary: Patient [facility] Biopsy Results   Gastroenterology  [address] [place], [place] [address]   Phone: [phone]  Fax: [phone]        May 14, 2009 MRN: [id number]    Rebecca [name] [address] [place], [place]  [address]    Dear Ms. Kush,  I am pleased to inform you that the biopsies taken during your recent endoscopic examination did not show any evidence of cancer upon pathologic examination. The biopsies showed a benign fundic gland polyp and normal duodenal mucosa.  Continue with the treatment plan as outlined on the day of your      exam.  Please call us if you are having persistent problems or have questions about your condition that have not been fully answered at this time.  Sincerely,  Meryl Dare MD [facility]  This letter has been electronically signed by your physician.  Appended Document: Patient Notice-Endo Biopsy Results letter mailed 2.11.11

## 2010-05-04 NOTE — Letter (Signed)
Summary: M/M Imaging Options/[facility]  M/M [facility] Options/[facility]   Imported By: Sherian Rein 03/17/2009 14:59:51  _____________________________________________________________________  External Attachment:    Type:   Image     Comment:   External Document

## 2010-05-04 NOTE — Assessment & Plan Note (Signed)
Summary: follow up colonoscopy/sheri   History of Present Illness Visit Type: Follow-up Visit Primary GI MD: Elie Goody MD [facility] Primary Provider: Darryll Capers, MD Requesting Provider: n/a Chief Complaint: F/u from colon and endo. Pt denies any GI complaints  History of Present Illness:   Rebecca [name] returns for followup of diverticulitis and iron deficiency anemia. Diverticulitis was treated with a course of Augmentin and her abdominal pain has resolved. She had a rectal ulcer noted on colonoscopy and biopsies were unremarkable. Adenomatous colon polyps were removed. Upper endoscopy was unremarkable.   GI Review of Systems      Denies abdominal pain, acid reflux, belching, bloating, chest pain, dysphagia with liquids, dysphagia with solids, heartburn, loss of appetite, nausea, vomiting, vomiting blood, weight loss, and  weight gain.        Denies anal fissure, black tarry stools, change in bowel habit, constipation, diarrhea, diverticulosis, fecal incontinence, heme positive stool, hemorrhoids, irritable bowel syndrome, jaundice, light color stool, liver problems, rectal bleeding, and  rectal pain.   Current Medications (verified): 1)  Hydrochlorothiazide 12.5 Mg Caps (Hydrochlorothiazide) .... Once Daily 2)  Synthroid 88 Mcg Tabs (Levothyroxine Sodium) .... Once Daily 3)  Xanax 0.25 Mg Tabs (Alprazolam) .... As Needed 4)  Calcium 500/d 500-200 Mg-Unit  Tabs (Calcium Carbonate-Vitamin D) .... 2 A Day 5)  Vytorin 10-20 Mg  Tabs (Ezetimibe-Simvastatin) .... Once Daily 6)  Align   Caps (Misc Intestinal Flora Regulat) .... One By Mouth Daily 7)  Dexilant 60 Mg Cpdr (Dexlansoprazole) .[name] Kitchen.. 1 Once Daily 8)  Klor-Con 8 Meq Cr-Tabs (Potassium Chloride) .[name] Kitchen.. 1 Once Daily 9)  Vitamin D 1200 .[name] Kitchen.. 1 Once Daily 10)  Macrodantin 50 Mg Caps (Nitrofurantoin Macrocrystal) .... Hold 11)  Niferex-Pn Forte  Tabs (Prenatal Vit-Fe Psac Cmplx-Fa) .... One By Mouth Daily ( May Sub For  Generic)  Allergies (verified): 1)  ! Codeine 2)  ! Cipro 3)  ! Ultram 4)  ! Pneumovax 23 (Pneumococcal Vac Polyvalent) 5)  ! * Levoquin 6)  ! Bactrim  Past History:  Past Medical History: Hypertension Diverticulosis, diverticulitis Esophageal Stricture Colon Polyps- Hyperplastic 06/2005 Anxiety Disorder Hemorrhoids Gastroparesis-49.6% solid retention at 2 hrs.  Anemia, fe deficiency  Past Surgical History: Reviewed history from 03/16/2009 and no changes required. 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 MGM, and maternal aunt   Social History: Reviewed history from 10/18/2006 and no changes required. Retired  Never Smoked Alcohol use-no Regular exercise-yes  Review of Systems       The pertinent positives and negatives are noted as above and in the HPI. All other ROS were reviewed and were negative.   Vital Signs:  Patient profile:   77 year old female Height:      64 inches Weight:      124 pounds BMI:     21.36 BSA:     1.60 Pulse rate:   68 / minute Pulse rhythm:   regular BP sitting:   112 / 72  (left arm) Cuff size:   regular  Vitals Entered By: Ok Anis CMA (June 10, 2009 1:52 PM)  Physical Exam  General:  Well developed, well nourished, no  acute distress. Head:  Normocephalic and atraumatic. Eyes:  PERRLA, no icterus. Ears:  Normal auditory acuity. Mouth:  No deformity or lesions, dentition normal. Lungs:  Clear throughout to auscultation. Heart:  Regular rate and rhythm; no murmurs, rubs,  or bruits. Abdomen:  Soft, nontender  and nondistended. No masses, hepatosplenomegaly or hernias noted. Normal bowel sounds. Psych:  Alert and cooperative. Normal mood and affect.  Impression & Recommendations:  Problem # 1:  ANEMIA, IRON DEFICIENCY (ICD-280.9) Continue iron replacement and follow up with Dr. Lovell Sheehan.  Problem # 2:  DIVERTICULITIS-COLON (ICD-562.11) Resolved. Long-term high fiber diet with liberal fluid intake.  Problem # 3:  ADENOMATOUS COLONIC POLYP (ICD-211.3) Surveillance colonoscopy in 5 years.  Problem # 4:  GERD (ICD-530.81) Continue Dexilant and antireflux measures.  Problem # 5:  GASTROPARESIS (ICD-536.3) Continue dietary management.  Cardiovascular Risk Assessment/Plan:      The patient's hypertensive risk group is category C: Target organ damage and/or diabetes.  Today's blood pressure is 112/72.  Her blood pressure goal is < 140/90.   Patient Instructions: 1)  Start Colace one tablet by mouth once daily. 2)  Please continue current medications.  3)  Copy sent to : Darryll Capers, MD 4)  The medication list was reviewed and reconciled.  All changed / newly prescribed medications were explained.  A complete medication list was provided to the patient / caregiver.

## 2010-05-04 NOTE — Progress Notes (Signed)
Summary: results & help  Phone Note Call from Patient Call back at [phone]   Summary of Call: To Sat clinic & took urine to [facility] for culture& was to call you today for results.  Not on med.  Sees urology next week, Dr. Jinny Sanders PA.  Still having symptoms of hurting over bladder & in vaginal area.  Her Gyn gave her estrogen creme for local use over urethra but it hasn't helped the hurting in her urethra.  RA [place].  Allerigc to cipro,levaquin, bactrim, codeine.   Initial call taken by: Rudy Jew, RN,  May 06, 2009 9:28 AM  Follow-up for Phone Call        insignificant growth from  s pectrum- made appointment with dr Earlene Plater pa for to morrow 2-3 at 10:30-pt aware Follow-up by: Willy Eddy, LPN,  May 06, 2009 10:19 AM

## 2010-05-04 NOTE — Assessment & Plan Note (Signed)
Summary: roa/db   Vital Signs:  Patient Profile:   77 Years Old Female Weight:      119 pounds (54.09 kg) Temp:     98.2 degrees F oral Pulse rate:   76 / minute Resp:     12 per minute BP sitting:   140 / 80  (left arm)  Vitals Entered By: Willy Eddy, LPN (November 15, 2006 10:12 AM)               Chief Complaint:  3 month f/u of labs.  History of Present Illness: Seen on 8/11 for UTI with improved symptoms   Follow-Up Visit      This is a 77 year old woman who presents for Follow-up visit.  The patient denies chest pain, palpitations, dizziness, syncope, low blood sugar symptoms, and edema.  The patient reports taking meds as prescribed.  When questioned about possible medication side effects, the patient notes fatigue.    Follow-Up Visit      Since the last visit the patient notes no new problems or concerns.    Current Allergies: ! CODEINE ! CIPRO ! ULTRAM ! RANITIDINE HCL (RANITIDINE HCL)  Past Medical History:    Reviewed history from 10/18/2006 and no changes required:       Hypertension       Diverticulosis, colon       GERD  Past Surgical History:    Reviewed history from 11/13/2006 and no changes required:       Tonsillectomy       Appendectomy       knee surgery bilateral       umbilical hernia   Family History:    Reviewed history from 10/18/2006 and no changes required:       Fam hx Crohn's dz       Fam hx polyps       Family History Diabetes 1st degree relative       Family Hsitory Headaches       Family History Hypertension  Social History:    Reviewed history from 10/18/2006 and no changes required:       Never Smoked       Alcohol use-no       Regular exercise-yes    Review of Systems       The patient complains of weight loss and unusual weight change.  The patient denies anorexia, fever, chest pain, dyspnea on exhertion, prolonged cough, abdominal pain, melena, hematochezia, severe indigestion/heartburn, muscle weakness,  suspicious skin lesions, abnormal bleeding, enlarged lymph nodes, and angioedema.         Insomnia   Physical Exam  General:     alert and underweight appearing.   Head:     normocephalic.   Eyes:     pupils equal and pupils round.   Ears:     R ear normal and L ear normal.   Nose:     no nasal discharge.   Mouth:     pharynx pink and moist.   Neck:     supple.   Lungs:     normal respiratory effort, normal breath sounds, no crackles, and no wheezes.   Heart:     normal rate and regular rhythm.   Abdomen:     soft and normal bowel sounds.   Msk:     no joint tenderness, no joint swelling, and no joint warmth.   Pulses:     R and L carotid,radial,femoral,dorsalis pedis and posterior tibial  pulses are full and equal bilaterally Neurologic:     alert & oriented X3.   Skin:     no rashes.   Cervical Nodes:     No lymphadenopathy noted Psych:     Oriented X3, subdued, and slightly anxious.      Impression & Recommendations:  Problem # 1:  HYPERLIPIDEMIA (ICD-272.4)  Her updated medication list for this problem includes:    Vytorin 10-20 Mg Tabs (Ezetimibe-simvastatin) ..... Once daily  Labs Reviewed: Chol: 234 (06/28/2006)   HDL: 49.7 (06/28/2006)   LDL: DEL (06/28/2006)   TG: 210 (06/28/2006) SGOT: 21 (06/15/2006)   SGPT: 15 (06/15/2006)   Problem # 2:  UTI (ICD-599.0)  Her updated medication list for this problem includes:    Amoxicillin 500 Mg Tabs (Amoxicillin) .[name] Kitchen... Take one (1) by mouth three times a day for 10 days  Encouraged to push clear liquids, get enough rest, and take acetaminophen as needed. To be seen in 10 days if no improvement, sooner if worse.   Problem # 3:  GERD (ICD-530.81)  Her updated medication list for this problem includes:    Prilosec 20 Mg Cpdr (Omeprazole) ..... Once daily    Nexium 40 Mg Cpdr (Esomeprazole magnesium) ..... Once daily  Diagnostics Reviewed:  Discussed lifestyle modifications, diet, antacids/medications,  and preventive measures. Handout provided.   Problem # 4:  HYPERTENSION (ICD-401.9)  The following medications were removed from the medication list:    Hydrochlorothiazide 12.5 Mg Caps (Hydrochlorothiazide) ..... Once daily  Her updated medication list for this problem includes:    Hydrochlorothiazide 12.5 Mg Caps (Hydrochlorothiazide) ..... Once daily  BP today: 140/80 Prior BP: 140/70 (11/13/2006)  Labs Reviewed: Creat: 0.9 (06/15/2006) Chol: 234 (06/28/2006)   HDL: 49.7 (06/28/2006)   LDL: DEL (06/28/2006)   TG: 210 (06/28/2006)   Problem # 5:  HYPOTHYROIDISM, PRIMARY (ICD-244.9)  Her updated medication list for this problem includes:    Synthroid 88 Mcg Tabs (Levothyroxine sodium) ..... Once daily  Labs Reviewed: TSH: 2.04 (06/28/2006)    HgBA1c: 5.6 (07/26/2006) Chol: 234 (06/28/2006)   HDL: 49.7 (06/28/2006)   LDL: DEL (06/28/2006)   TG: 210 (06/28/2006)   Problem # 6:  CANDIDIASIS, ORAL (ICD-112.0) mycelex trouches  Complete Medication List: 1)  Hydrochlorothiazide 12.5 Mg Caps (Hydrochlorothiazide) .... Once daily 2)  Synthroid 88 Mcg Tabs (Levothyroxine sodium) .... Once daily 3)  Xanax 0.25 Mg Tabs (Alprazolam) .... As needed 4)  Prilosec 20 Mg Cpdr (Omeprazole) .... Once daily 5)  Nexium 40 Mg Cpdr (Esomeprazole magnesium) .... Once daily 6)  Klor-con 10 10 Meq Tbcr (Potassium chloride) .... Once daily 7)  Calcium 500/d 500-200 Mg-unit Tabs (Calcium carbonate-vitamin d) .... 2 a day 8)  Vytorin 10-20 Mg Tabs (Ezetimibe-simvastatin) .... Once daily 9)  Amoxicillin 500 Mg Tabs (Amoxicillin) .... Take one (1) by mouth three times a day for 10 days 10)  Mycelex 10 Mg Troc (Clotrimazole) .... One by mouth qid for 3 days   Patient Instructions: 1)  weight loss 2)  slight increase in calories with nuts fruit and whole grains and soy milk will help. 3)  Please schedule a follow-up appointment in 3 months.    Prescriptions: MYCELEX 10 MG  TROC (CLOTRIMAZOLE)  one by mouth qid for 3 days  #12 x 0   Entered and Authorized by:   Stacie Glaze MD   Signed by:   Stacie Glaze MD on 11/15/2006   Method used:   Print then Give to Patient  RxID:   [id number]

## 2010-05-04 NOTE — Progress Notes (Signed)
Summary: sinus infection  Phone Note Call from Patient   Caller: Patient Call For: Dr. Lovell Sheehan Summary of Call: Pt calls stating she has a sinus infection again, and would like a ZPak called to [facility]) [phone] Initial call taken by: Lynann Beaver CMA,  February 21, 2008 9:35 AM    New/Updated Medications: ZITHROMAX Z-PAK 250 MG TABS (AZITHROMYCIN) take as directed BROMFED 4-60 MG TABS (BROMPHENIRAMINE-PSEUDOEPH) 1 two times a day for 10 days   Prescriptions: BROMFED 4-60 MG TABS (BROMPHENIRAMINE-PSEUDOEPH) 1 two times a day for 10 days  #20 x 0   Entered by:   Willy Eddy, LPN   Authorized by:   Stacie Glaze MD   Signed by:   Willy Eddy, LPN on 10/93/2355   Method used:   Electronically to        [facility]  [address] [facility]* (retail)       [address]       [place], [place]  [address]       Ph: [phone] or [phone]       Fax: [phone]   RxID:   [id number] ZITHROMAX Z-PAK 250 MG TABS (AZITHROMYCIN) take as directed  #1 x 0   Entered by:   Willy Eddy, LPN   Authorized by:   Stacie Glaze MD   Signed by:   Willy Eddy, LPN on 18/29/9371   Method used:   Electronically to        [facility]  [address] [facility]* (retail)       [address]       [place], [place]  [address]       Ph: [phone] or [phone]       Fax: [phone]   RxID:   [id number]   Appended Document: sinus infection per dr j z pack and bromfed and pt notified

## 2010-05-04 NOTE — Progress Notes (Signed)
Summary: stop K?  Phone Note Call from Patient   Caller: Patient- Reason for Call: Lab or Test Results Summary of Call: stopped hctz at [facility] on friday- is she to stop k+ also?pt informed it will be monday before answer so he can look at lab work first. Initial call taken by: Willy Eddy, LPN,  Aug 21, 2009 12:53 PM  Follow-up for Phone Call        yes stop K with the stopping of the HCTZ but do not throw away until we see what her blood pressure does Follow-up by: Stacie Glaze MD,  Aug 21, 2009 2:16 PM  Additional Follow-up for Phone Call Additional follow up Details #1::        Left detailed message on pt's personal voice mail. Additional Follow-up by: Lynann Beaver CMA,  Aug 21, 2009 2:38 PM

## 2010-05-04 NOTE — Progress Notes (Signed)
Summary: Medicine Change   Phone Note Call from Patient Call back at Home Phone [phone]   Caller: Patient Call For: Marchelle Folks Summary of Call: Pt left a message on my voicemail stating she asked the pharmacist if she could crush up the mediciation per our recommendations and they state she can. Pt states she does not want to do this instead she would like a liquid form of the medicine. See prior phone note on pt from today and please advise.  Initial call taken by: Christie Nottingham CMA Duncan Dull),  May 14, 2009 10:50 AM Call placed by: Christie Nottingham CMA Duncan Dull),  May 14, 2009 10:47 AM  Follow-up for Phone Call        If Augmentin or generic equivalent is available in liquid form please change it. Same dose and schedule. Follow-up by: Meryl Dare MD Clementeen Graham,  May 14, 2009 10:55 AM  Additional Follow-up for Phone Call Additional follow up Details #1::        According to pharmacist at [facility], they have a augmentin 600mg  ES liquid version. She states the closest equivalent would be 1 1/2 tsp two times a day x 7 days. She states the pt would be on 900mg  instead of 875. I did verbal order to [employer].  Additional Follow-up by: Christie Nottingham CMA Duncan Dull),  May 14, 2009 11:06 AM

## 2010-05-04 NOTE — Assessment & Plan Note (Signed)
Summary: 2 month rov/njr   Vital Signs:  Patient profile:   77 year old female Height:      64 inches Weight:      126 pounds BMI:     21.71 Temp:     98.2 degrees F oral Pulse rate:   72 / minute Resp:     14 per minute BP sitting:   136 / 76  (left arm)  Vitals Entered By: Willy Eddy, LPN (April 28, 2009 1:43 PM) CC: roa- currently on augmentin ferom dr Thomasena Edis for uti, Hypertension Management, Abdominal Pain   Primary Care Provider:  Darryll Capers, MD  CC:  roa- currently on augmentin ferom dr Thomasena Edis for uti, Hypertension Management, and Abdominal Pain.  History of Present Illness: Still having problems with the foot for mortenson's neuroma and plantar faciatis  anemai that t is borderline for iron deficiency only one point into normal with anema makes this highly likely ( she has had bleeding with two surgery) reveiwed the labs and the CT form Dec has an appointment with stark  review of records shows no anemia prior to her surgeries ( [facility] 2010)   Dyspepsia History:      She has no alarm features of dyspepsia including no history of melena, hematochezia, dysphagia, persistent vomiting, or involuntary weight loss > 5%.  There is a prior history of GERD.  The patient does not have a prior history of documented ulcer disease.  A prior EGD has been done which showed moderate or severe esophagitis.    Hypertension History:      She denies headache, chest pain, palpitations, dyspnea with exertion, orthopnea, PND, peripheral edema, visual symptoms, neurologic problems, syncope, and side effects from treatment.        Positive major cardiovascular risk factors include female age 69 years old or older, hyperlipidemia, and hypertension.  Negative major cardiovascular risk factors include non-tobacco-user status.        Positive history for target organ damage include peripheral vascular disease.  Further assessment for target organ damage reveals no history of ASHD or  stroke/TIA.      Preventive Screening-Counseling & Management  Alcohol-Tobacco     Smoking Status: never  Problems Prior to Update: 1)  Nonspecific Abn Finding Rad & Oth Exam Gi Tract  (ICD-793.4) 2)  Diarrhea-presumed Infectious  (ICD-009.3) 3)  Diarrhea  (ICD-787.91) 4)  Anemia  (ICD-285.9) 5)  Carotid Artery Disease  (ICD-433.10) 6)  Gastroparesis  (ICD-536.3) 7)  Allergic Rhinitis Cause Unspecified  (ICD-477.9) 8)  Screening Colorectal-cancer  (ICD-V76.51) 9)  Dysphagia  (ICD-787.29) 10)  Ganglion of Tendon Sheath  (ICD-727.42) 11)  Weight Loss, Abnormal  (ICD-783.21) 12)  Abdominal Pain, Generalized  (ICD-789.07) 13)  Ganglion of Joint  (ICD-727.41) 14)  Chest Pain, Atypical  (ICD-786.59) 15)  Candidiasis, Oral  (ICD-112.0) 16)  Hypothyroidism, Primary  (ICD-244.9) 17)  Hyperlipidemia  (ICD-272.4) 18)  Advef, Drug/medicinal/biological Subst Nos  (ICD-995.20) 19)  Family History Diabetes 1st Degree Relative  (ICD-V18.0) 20)  Gerd  (ICD-530.81) 21)  Diverticulosis, Colon  (ICD-562.10) 22)  Hypertension  (ICD-401.9)  Medications Prior to Update: 1)  Hydrochlorothiazide 12.5 Mg Caps (Hydrochlorothiazide) .... Once Daily 2)  Synthroid 88 Mcg Tabs (Levothyroxine Sodium) .... Once Daily 3)  Xanax 0.25 Mg Tabs (Alprazolam) .... As Needed 4)  Klor-Con 10 10 Meq  Tbcr (Potassium Chloride) .... Once Daily 5)  Calcium 500/d 500-200 Mg-Unit  Tabs (Calcium Carbonate-Vitamin D) .... 2 A Day 6)  Vytorin 10-20 Mg  Tabs (Ezetimibe-Simvastatin) .... Once Daily 7)  Align   Caps (Misc Intestinal Flora Regulat) .... One By Mouth Daily 8)  Dexilant 60 Mg Cpdr (Dexlansoprazole) .[name] Kitchen.. 1 Once Daily 9)  Reglan 5 Mg Tabs (Metoclopramide Hcl) .[name] Kitchen.. 1 Before Dinner Prn 10)  Klor-Con 8 Meq Cr-Tabs (Potassium Chloride) .[name] Kitchen.. 1 Once Daily 11)  Vitamin D 1200 .[name] Kitchen.. 1 Once Daily 12)  Macrodantin 50 Mg Caps (Nitrofurantoin Macrocrystal) .... Hold  Current Medications (verified): 1)  Hydrochlorothiazide  12.5 Mg Caps (Hydrochlorothiazide) .... Once Daily 2)  Synthroid 88 Mcg Tabs (Levothyroxine Sodium) .... Once Daily 3)  Xanax 0.25 Mg Tabs (Alprazolam) .... As Needed 4)  Klor-Con 10 10 Meq  Tbcr (Potassium Chloride) .... Once Daily 5)  Calcium 500/d 500-200 Mg-Unit  Tabs (Calcium Carbonate-Vitamin D) .... 2 A Day 6)  Vytorin 10-20 Mg  Tabs (Ezetimibe-Simvastatin) .... Once Daily 7)  Align   Caps (Misc Intestinal Flora Regulat) .... One By Mouth Daily 8)  Dexilant 60 Mg Cpdr (Dexlansoprazole) .[name] Kitchen.. 1 Once Daily 9)  Reglan 5 Mg Tabs (Metoclopramide Hcl) .[name] Kitchen.. 1 Before Dinner Prn 10)  Klor-Con 8 Meq Cr-Tabs (Potassium Chloride) .[name] Kitchen.. 1 Once Daily 11)  Vitamin D 1200 .[name] Kitchen.. 1 Once Daily 12)  Macrodantin 50 Mg Caps (Nitrofurantoin Macrocrystal) .... Hold 13)  Niferex-Pn Forte  Tabs (Prenatal Vit-Fe Psac Cmplx-Fa) .... One By Mouth Daily ( May Sub For Generic)  Allergies (verified): 1)  ! Codeine 2)  ! Cipro 3)  ! Ultram 4)  ! Pneumovax 23 (Pneumococcal Vac Polyvalent) 5)  ! * Levoquin 6)  ! Bactrim  Past History:  Family History: Last updated: 03/11/2008 Fam hx Crohn's dz-niece Fam hx polyps-Brother, Sister, daughter Family History Diabetes 1st degree relative Family Hsitory Headaches Family History Hypertension  Social History: Last updated: 10/18/2006 Never Smoked Alcohol use-no Regular exercise-yes  Risk Factors: Exercise: yes (10/18/2006)  Risk Factors: Smoking Status: never (04/28/2009)  Past medical, surgical, family and social histories (including risk factors) reviewed, and no changes noted (except as noted below).  Past Medical History: Reviewed history from 03/16/2009 and no changes required. Hypertension Diverticulosis, colon GERD Esophageal Stricture Colon Polyps- Hyperplastic 06/2005 Anxiety Disorder Hemorrhoids Gastroparesis-49.6% solid retention at 2 hrs.  Anemia  Past Surgical History: Reviewed history from 03/16/2009 and no changes  required. 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 Fam hx Crohn's dz-niece Fam hx polyps-Brother, Sister, daughter Family History Diabetes 1st degree relative Family Hsitory Headaches Family History Hypertension  Social History: Reviewed history from 10/18/2006 and no changes required. Never Smoked Alcohol use-no Regular exercise-yes  Review of Systems  The patient denies anorexia, fever, weight loss, weight gain, vision loss, decreased hearing, hoarseness, chest pain, syncope, dyspnea on exertion, peripheral edema, prolonged cough, headaches, hemoptysis, abdominal pain, melena, hematochezia, severe indigestion/heartburn, hematuria, incontinence, genital sores, muscle weakness, suspicious skin lesions, transient blindness, difficulty walking, depression, unusual weight change, abnormal bleeding, enlarged lymph nodes, angioedema, and breast masses.    Physical Exam  General:  alert and pale.   Head:  normocephalic and atraumatic.   Eyes:  pupils equal and pupils round.   Mouth:  No deformity or lesions, dentition normal. Neck:  No deformities, masses, or tenderness noted. Lungs:  normal respiratory effort and no wheezes.   Heart:  normal rate and regular  rhythm.   Abdomen:  soft, no guarding, no rigidity, no rebound tenderness, no abdominal hernia, and epigastric tenderness.   Msk:  no joint swelling and no joint warmth.   Pulses:  R and L carotid,radial,femoral,dorsalis pedis and posterior tibial pulses are full and equal bilaterally Extremities:  No clubbing, cyanosis, edema, or deformity noted with normal full range of motion of all joints.   Neurologic:  alert & oriented X3 and cranial nerves II-XII  intact.     Impression & Recommendations:  Problem # 1:  ANEMIA (ICD-285.9) niferex forte for 2 months Hgb: 10.8 (03/03/2009)   Hct: 32.0 (03/03/2009)   Platelets: 263.0 (03/03/2009) RBC: 3.29 (03/03/2009)   RDW: 14.1 (03/03/2009)   WBC: 7.8 (03/03/2009) MCV: 97.4 (03/03/2009)   MCHC: 33.6 (03/03/2009) Ferritin: 26.0 (03/16/2009) Iron: 44 (03/16/2009)   % Sat: 13.9 (03/16/2009) B12: 787 (03/16/2009)   Folate: >20.0 ng/mL (03/16/2009)   TSH: 1.45 (03/05/2009)  Problem # 2:  WEIGHT LOSS, ABNORMAL (ICD-783.21) stabilized and weight is "picking up"  Problem # 3:  HYPERLIPIDEMIA (ICD-272.4) Assessment: Unchanged  Her updated medication list for this problem includes:    Vytorin 10-20 Mg Tabs (Ezetimibe-simvastatin) ..... Once daily  Labs Reviewed: SGOT: 18 (03/03/2009)   SGPT: 16 (03/03/2009)  Lipid Goals: Chol Goal: 200 (07/28/2008)   HDL Goal: 40 (07/28/2008)   LDL Goal: 130 (07/28/2008)   TG Goal: 150 (07/28/2008)  Prior 10 Yr Risk Heart Disease: Not enough information (07/09/2007)   HDL:49.90 (07/28/2008), 52.1 (06/28/2007)  LDL:DEL (06/28/2007), DEL (11/07/2006)  Chol:209 (07/28/2008), 217 (06/28/2007)  Trig:155.0 (07/28/2008), 213 (06/28/2007)  Problem # 4:  HYPERTENSION (ICD-401.9) Assessment: Unchanged stable Her updated medication list for this problem includes:    Hydrochlorothiazide 12.5 Mg Caps (Hydrochlorothiazide) ..... Once daily  BP today: 136/76 Prior BP: 132/70 (03/16/2009)  Prior 10 Yr Risk Heart Disease: Not enough information (07/09/2007)  Labs Reviewed: K+: 3.8 (03/03/2009) Creat: : 0.8 (03/03/2009)   Chol: 209 (07/28/2008)   HDL: 49.90 (07/28/2008)   LDL: DEL (06/28/2007)   TG: 155.0 (07/28/2008)  Complete Medication List: 1)  Hydrochlorothiazide 12.5 Mg Caps (Hydrochlorothiazide) .... Once daily 2)  Synthroid 88 Mcg Tabs (Levothyroxine sodium) .... Once daily 3)  Xanax 0.25 Mg Tabs (Alprazolam) .... As needed 4)  Klor-con 10 10 Meq Tbcr (Potassium  chloride) .... Once daily 5)  Calcium 500/d 500-200 Mg-unit Tabs (Calcium carbonate-vitamin d) .... 2 a day 6)  Vytorin 10-20 Mg Tabs (Ezetimibe-simvastatin) .... Once daily 7)  Align Caps (Misc intestinal flora regulat) .... One by mouth daily 8)  Dexilant 60 Mg Cpdr (Dexlansoprazole) .[name] Kitchen.. 1 once daily 9)  Reglan 5 Mg Tabs (Metoclopramide hcl) .[name] Kitchen.. 1 before dinner prn 10)  Klor-con 8 Meq Cr-tabs (Potassium chloride) .[name] Kitchen.. 1 once daily 11)  Vitamin D 1200  .[name] Kitchen.. 1 once daily 12)  Macrodantin 50 Mg Caps (Nitrofurantoin macrocrystal) .... Hold 13)  Niferex-pn Forte Tabs (Prenatal vit-fe psac cmplx-fa) .... One by mouth daily ( may sub for generic)  Dyspepsia Assessment/Plan:  Step Therapy: GERD Treatment Protocols:    Step-1: failed    Step-2: failed    Step-3: improved  Hypertension Assessment/Plan:      The patient's hypertensive risk group is category C: Target organ damage and/or diabetes.  Today's blood pressure is 136/76.  Her blood pressure goal is < 140/90.  Patient Instructions: 1)  Please schedule a follow-up appointment in 2 months. 2)  CBC w/ Diff prior to visit, ICD-9: 280.9 3)  iron  290.8 Prescriptions: NIFEREX-PN FORTE  TABS (PRENATAL VIT-FE PSAC CMPLX-FA) one by mouth daily ( may sub for generic)  #30 x 6   Entered and Authorized by:   Stacie Glaze MD   Signed by:   Stacie Glaze MD on 04/28/2009   Method used:   Electronically to        [facility]  [address] # [id number]* (retail)       [address]       [place], [place]  [address]       Ph: [phone] or [phone]       Fax: [phone]   RxID:   [id number]

## 2010-05-04 NOTE — Assessment & Plan Note (Signed)
Summary: 1 month rov/njr   Vital Signs:  Patient Profile:   77 Years Old Female Height:     64 inches Weight:      122 pounds Temp:     98.2 degrees F oral Pulse rate:   76 / minute Resp:     14 per minute BP sitting:   110 / 76  (left arm)  Vitals Entered By: Willy Eddy, LPN (September 25, 2007 11:58 AM)                 Chief Complaint:  roa with gerd- improved but some sx at night.  History of Present Illness:  Follow-Up Visit      This is a 77 year old woman who presents for Follow-up visit.  GERD the align is helping but still is having reflux and is not taking the nexxium every day, does take the zantac.  The patient complains of chest pain, but denies palpitations, dizziness, syncope, low blood sugar symptoms, high blood sugar symptoms, edema, SOB, DOE, PND, and orthopnea.  Since the last visit the patient notes problems with medications and being seen by a specialist.  When questioned about possible medication side effects, the patient notes nausea and GI upset.      Current Allergies: ! CODEINE ! CIPRO ! ULTRAM ! RANITIDINE HCL (RANITIDINE HCL)  Past Medical History:    Reviewed history from 10/18/2006 and no changes required:       Hypertension       Diverticulosis, colon       GERD  Past Surgical History:    Reviewed history from 11/13/2006 and no changes required:       Tonsillectomy       Appendectomy       knee surgery bilateral       umbilical hernia   Family History:    Reviewed history from 10/18/2006 and no changes required:       Fam hx Crohn's dz       Fam hx polyps       Family History Diabetes 1st degree relative       Family Hsitory Headaches       Family History Hypertension  Social History:    Reviewed history from 10/18/2006 and no changes required:       Never Smoked       Alcohol use-no       Regular exercise-yes    Review of Systems       The patient complains of severe indigestion/heartburn.  The patient denies  anorexia, fever, weight loss, weight gain, vision loss, decreased hearing, hoarseness, chest pain, syncope, dyspnea on exertion, peripheral edema, prolonged cough, headaches, hemoptysis, abdominal pain, melena, hematochezia, hematuria, incontinence, genital sores, muscle weakness, suspicious skin lesions, transient blindness, difficulty walking, depression, unusual weight change, abnormal bleeding, enlarged lymph nodes, angioedema, and breast masses.     Physical Exam  General:     Well-developed,well-nourished,in no acute distress; alert,appropriate and cooperative throughout examination Eyes:     pupils equal and pupils round.  pupils reactive to light.   Ears:     R ear normal and L ear normal.   Nose:     no nasal discharge.  no external erythema.   Mouth:     pharynx pink and moist.  no posterior lymphoid hypertrophy.   Neck:     supple.  full ROM and no masses.   Lungs:     Normal respiratory effort, chest expands symmetrically. Lungs  are clear to auscultation, no crackles or wheezes. Heart:     Normal rate and regular rhythm. S1 and S2 normal without gallop, murmur, click, rub or other extra sounds. Abdomen:     soft, epigastric tenderness, and RUQ tenderness.      Impression & Recommendations:  Problem # 1:  HYPERTENSION (ICD-401.9)  Her updated medication list for this problem includes:    Hydrochlorothiazide 12.5 Mg Caps (Hydrochlorothiazide) ..... Once daily  Labs Reviewed: Chol: 217 (06/28/2007)   HDL: 52.1 (06/28/2007)   LDL: DEL (06/28/2007)   TG: 213 (06/28/2007) SGOT: 23 (06/28/2007)   SGPT: 17 (06/28/2007)  Prior 10 Yr Risk Heart Disease: Not enough information (07/09/2007)   Problem # 2:  ABDOMINAL PAIN, GENERALIZED (ICD-789.07)  Her updated medication list for this problem includes:    Reglan 5 Mg Tabs (Metoclopramide hcl) .[name] Kitchen... 1/2 two times a day Discussed symptom control with the patient.   Problem # 3:  GERD (ICD-530.81)  Her updated medication  list for this problem includes:    Zantac 300 Mg Tabs (Ranitidine hcl) ..... One by mouth q hs    Nexium 40 Mg Cpdr (Esomeprazole magnesium) ..... One by mouth q am   Complete Medication List: 1)  Hydrochlorothiazide 12.5 Mg Caps (Hydrochlorothiazide) .... Once daily 2)  Synthroid 88 Mcg Tabs (Levothyroxine sodium) .... Once daily 3)  Xanax 0.25 Mg Tabs (Alprazolam) .... As needed 4)  Klor-con 10 10 Meq Tbcr (Potassium chloride) .... Once daily 5)  Calcium 500/d 500-200 Mg-unit Tabs (Calcium carbonate-vitamin d) .... 2 a day 6)  Vytorin 10-20 Mg Tabs (Ezetimibe-simvastatin) .... Once daily 7)  Zantac 300 Mg Tabs (Ranitidine hcl) .... One by mouth q hs 8)  Nexium 40 Mg Cpdr (Esomeprazole magnesium) .... One by mouth q am 9)  Reglan 5 Mg Tabs (Metoclopramide hcl) .... 1/2 two times a day 10)  Align Caps (Misc intestinal flora regulat) .... One by mouth daily   Patient Instructions: 1)  Please schedule a follow-up appointment in 3 months.   Prescriptions: ALIGN   CAPS (MISC INTESTINAL FLORA REGULAT) one by mouth daily  #30 x 11   Entered and Authorized by:   Stacie Glaze MD   Signed by:   Stacie Glaze MD on 10/01/2007   Method used:   Print then Give to Patient   RxID:   [id number]  ]

## 2010-05-04 NOTE — Assessment & Plan Note (Signed)
Summary: abdominal pain/anemia/sheri   History of Present Illness:   Patient is a 77 year old female followed by Dr. Russella Dar for history of GERD, gastroparesis, diverticulosis. She was last seen Dec. 2009. Presents now with one month history of increasing diarrhea associated with mid abdominal pain. Gives history of IBS-D but I don't see that in her records. Having some loose stools in am, they gradually become formed throughout the day. Her mid abdominal pain is worse when sitting, feels better stretching out in supine position. Pain not related to eating, gets temporary relief with BM. Pain doesn't wake her up at night. Recently treated with Flagyl, got partial relief upon initiation of medication but relapsed  before even completing full course.  No fever. Slight nausea. Concerned about colon infection related to recent antibiotics for UTI.  Weight down a little from recent foot surgery and post-op infections (Oct.).     GI Review of Systems     Location of  Abdominal pain: mid . Weight loss of 5 pounds over 3 months.   Denies abdominal pain, acid reflux, belching, bloating, chest pain, dysphagia with liquids, dysphagia with solids, heartburn, loss of appetite, nausea, vomiting, vomiting blood, weight loss, and  weight gain.      Reports diarrhea.     Denies anal fissure, black tarry stools, change in bowel habit, constipation, diverticulosis, fecal incontinence, heme positive stool, hemorrhoids, irritable bowel syndrome, jaundice, light color stool, liver problems, rectal bleeding, and  rectal pain.    Current Medications (verified): 1)  Hydrochlorothiazide 12.5 Mg Caps (Hydrochlorothiazide) .... Once Daily 2)  Synthroid 88 Mcg Tabs (Levothyroxine Sodium) .... Once Daily 3)  Xanax 0.25 Mg Tabs (Alprazolam) .... As Needed 4)  Klor-Con 10 10 Meq  Tbcr (Potassium Chloride) .... Once Daily 5)  Calcium 500/d 500-200 Mg-Unit  Tabs (Calcium Carbonate-Vitamin D) .... 2 A Day 6)  Vytorin 10-20 Mg  Tabs  (Ezetimibe-Simvastatin) .... Once Daily 7)  Align   Caps (Misc Intestinal Flora Regulat) .... One By Mouth Daily 8)  Dexilant 60 Mg Cpdr (Dexlansoprazole) .[name] Kitchen.. 1 Once Daily 9)  Reglan 5 Mg Tabs (Metoclopramide Hcl) .[name] Kitchen.. 1 Before Dinner Prn 10)  Klor-Con 8 Meq Cr-Tabs (Potassium Chloride) .[name] Kitchen.. 1 Once Daily 11)  Vitamin D 1200 .[name] Kitchen.. 1 Once Daily 12)  Macrodantin 50 Mg Caps (Nitrofurantoin Macrocrystal) .... Hold  Allergies (verified): 1)  ! Codeine 2)  ! Cipro 3)  ! Ultram 4)  ! Pneumovax 23 (Pneumococcal Vac Polyvalent) 5)  ! * Levoquin 6)  ! Bactrim  Past History:  Past Medical History: Hypertension Diverticulosis, colon GERD Esophageal Stricture Colon Polyps- Hyperplastic 06/2005 Anxiety Disorder Hemorrhoids Gastroparesis-49.6% solid retention at 2 hrs.  Anemia  Past Surgical History: Tonsillectomy Appendectomy knee surgery bilateral umbilical hernia repair Salivary Gland removed Lt side double pallital tori bone removed mortensons neuroma on right foot and platar facial release 201o Oral surgery 2010 Foot surgery 2010  Review of Systems  The patient denies allergy/sinus, anemia, anxiety-new, arthritis/joint pain, back pain, blood in urine, breast changes/lumps, change in vision, confusion, cough, coughing up blood, depression-new, fainting, fatigue, fever, headaches-new, hearing problems, heart murmur, heart rhythm changes, itching, menstrual pain, muscle pains/cramps, night sweats, nosebleeds, pregnancy symptoms, shortness of breath, skin rash, sleeping problems, sore throat, swelling of feet/legs, swollen lymph glands, thirst - excessive , urination - excessive , urination changes/pain, urine leakage, vision changes, and voice change.    Vital Signs:  Patient profile:   77 year old female Height:  64 inches Weight:      121.4 pounds BMI:     20.91 Pulse rate:   62 / minute Pulse rhythm:   regular BP sitting:   132 / 70  (right arm)  Vitals Entered By: Ashok Cordia RN (March 16, 2009 9:43 AM)  Physical Exam  General:  Well developed, well nourished, no acute distress. Head:  Normocephalic and atraumatic. Eyes:   Conjunctiva pink, no icterus.  Mouth:  No oral lesions. Tongue moist.  Neck:  no obvious masses  Lungs:  Clear throughout to auscultation. Heart:  Regular rate and rhythm; no murmurs, rubs,  or bruits. Abdomen:  Abdomen soft, nondistended. Mild-moderate periumbilical tenderness. No obvious masses or hepatomegaly.Normal bowel sounds.  Rectal:  No hemorrhoids, internal / external  masses felt. No impaction Msk:  Symmetrical with no gross deformities. Normal posture. Extremities:  No palmar erythema, no edema.  Neurologic:  Alert and  oriented x4;  grossly normal neurologically. Skin:  Intact without significant lesions or rashes. Psych:  Alert and cooperative. Normal mood and affect.   Impression & Recommendations:  Problem # 1:  DIARRHEA-PRESUMED INFECTIOUS (ICD-009.3) Assessment Comment Only One month history of frequent loose stools and mid abdominal pain. Labs on 03/03/09 show normal WBC, LFTs and Amylase. Hgb. 10.8. She has mild nausea, mild mid abdominal tenderness on exam, mild normocytic anemia, and her weight is down five pounds (but that could be attributed to recent foot surgery / post-op infections). Doubt infectious etiology (despite recent antibiotics) as she is having some formed stools and course of Flagyl really didn't help. No fecal impaction to suggest overflow diarrhea.TSH is normal. CTscan March 2008 showed distended sigmoid colon filled with stool and a decompressed rectum. While these findings could suggest obstruction, patient had colonoscopy one year earlier (Mar. 2007) which was normal except for diverticulosis. Etiology of her pain not clear but I think CTscan is reasonable.  Patient will be called with test results and any further recommendations based on those results.  Follow up with Dr. Russella Dar.   Problem # 2:  ABDOMINAL PAIN, GENERALIZED (ICD-789.07) Assessment: Comment Only  See #1.  Orders: CT Abdomen/Pelvis with Contrast (CT Abd/Pelvis w/con)  Problem # 3:  ANEMIA (ICD-285.9) See #1. May be secondary to oral surgery but that was back in March so not likely. Hgb had been 12-13 range since at least 2007, so this is new. Check iron studies, B12, Folate. Heme negative today.   Orders: TLB-IBC Pnl (Iron/FE;Transferrin) (83550-IBC) TLB-Ferritin (82728-FER) TLB-B12, Serum-Total ONLY (04540-J81) TLB-Folic Acid (Folate) (82746-FOL) CT Abdomen/Pelvis with Contrast (CT Abd/Pelvis w/con)  Problem # 4:  GASTROPARESIS (ICD-536.3) Occasionally requires Reglan.  Problem # 5:  NONSPECIFIC ABN FINDING RAD & OTH EXAM GI TRACT (ICD-793.4) Multiple hepatic cysts on CTscan 2008, doubt clinically relevant. LFTs normal.   Patient Instructions: 1)  Get your labs drawn today in the basement.  2)  You have been scheduled for a CTscan.  3)  Copy sent to : Darryll Capers, MD 4)  The medication list was reviewed and reconciled.  All changed / newly prescribed medications were explained.  A complete medication list was provided to the patient / caregiver.

## 2010-05-04 NOTE — Progress Notes (Signed)
Summary: results of labs   Phone Note Call from Patient Call back at Work Phone [phone]   Caller: patient triage message Call For: Lovell Sheehan Summary of Call: Saw Dr Lovell Sheehan a few weeks ago  her gave her a little whitte pill to take before meals this helped with the pain but still having burning Would like to have appt set up with Dr [facility] cell [phone] would also like results of labs Initial call taken by: Roselle Locus,  March 07, 2007 3:31 PM

## 2010-05-04 NOTE — Procedures (Signed)
Summary: EGD   EGD  Procedure date:  08/11/2000  Findings:      Location: [facility]    Patient Name: Rebecca [name], Rebecca [name] b. MRN: [id number] Procedure Procedures: Panendoscopy (EGD) CPT: 43235.    with esophageal dilation. CPT: G9296129.  Personnel: Endoscopist: Barbette Hair. Arlyce Dice, MD.  Exam Location: Exam performed in Endoscopy Suite.  Patient Consent: Procedure, Alternatives, Risks and Benefits discussed, consent obtained,  Indications  Abnormal Exams, Studies: UGI, abnormal.  Symptoms: Dysphagia.  Comments: Prominent crycopharyngeal muscle and distal esophageal stricture seen History  Pre-Exam Physical: Performed Aug 11, 2000  Cardio-pulmonary exam, HEENT exam, Abdominal exam, Extremity exam, Neurological exam, Mental status exam WNL.  Exam Exam Info: Maximum depth of insertion Duodenum, intended Duodenum. ASA Classification: II. Tolerance: fair, adequate exam.  Sedation Meds: Fentanyl 100 mcg. Versed 9 mg. Robinul 0.2 Cetacaine Spray 1 sprays  Monitoring: BP and pulse monitoring done. Oximetry used. Supplemental O2 given  Fluoroscopy: Fluoroscopy was not used.  Findings STRICTURE / STENOSIS: Stenosis in Proximal Esophagus.  Constriction: partial. 25 cm from mouth.  STRICTURE / STENOSIS: Stricture in Distal Esophagus.  Constriction: partial. 38 cm from mouth. ICD9: Esophageal Stricture: 530.3.  - Dilation: Distal Esophagus. Procedure was performed under Fluoroscopy. Savary dilator used, Diameter: 15-16-17 mm, Moderate Resistance, No Heme present on extraction.   Assessment Abnormal examination, see findings above.  Diagnoses: 530.3: Esophageal Stricture.   Events  Unplanned Intervention: No unplanned interventions were required.  Unplanned Events: There were no complications. Plans Disposition: After procedure patient sent to recovery.  Scheduling: Office Visit, to [employer]. Arlyce Dice, MD, around Sep 01, 2000.    cc: Richard A. Jacky Kindle, MD     This report was created from the original endoscopy report, which was reviewed and signed by the above listed endoscopist.

## 2010-05-04 NOTE — Progress Notes (Signed)
Summary: UTI  Phone Note Call from Patient   Caller: Patient Call For: Stacie Glaze MD Reason for Call: Refill Medication Summary of Call: Pt calls and is having symptoms of a UTI, lower abdomen pain with frequency and urgency.  Would like an office visit. [phone] No Cipro, or Levaquin. Initial call taken by: Lynann Beaver CMA,  January 02, 2009 8:38 AM  Follow-up for Phone Call        put in the afternoon Follow-up by: Stacie Glaze MD,  January 02, 2009 8:42 AM  Additional Follow-up for Phone Call Additional follow up Details #1::        pt aware of ov Additional Follow-up by: Willy Eddy, LPN,  January 02, 2009 8:45 AM

## 2010-05-04 NOTE — Assessment & Plan Note (Signed)
Summary: M3A//[facility]  Medications Added ZEGERID 40-1100 MG  CAPS (OMEPRAZOLE-SODIUM BICARBONATE) one by mouth at bedtime REGLAN 5 MG  TABS (METOCLOPRAMIDE HCL) one by mouth q AC        Vital Signs:  Patient Profile:   77 Years Old Female Height:     64 inches Weight:      122 pounds Temp:     98.5 degrees F Pulse rate:   72 / minute Resp:     12 per minute BP sitting:   130 / 78  (left arm)  Vitals Entered By: Willy Eddy, LPN (February 14, 2007 9:09 AM)                 Chief Complaint:  roa/c/o increased gerd sx with esophogeal pain .  History of Present Illness: Iincreased GERD symptoms Throat pain and burning. Mid chest pain without SOD or nausia. Increased gastritis. No dark stools. No vomiting. No diaphoresis No radiation of pain Not exertional  Intermitent   Dyspepsia History:      There is a prior history of GERD.  She notes that there have been breakthrough symptoms despite maximum H-2 blocker or PPI therapy.  The patient does not have a prior history of documented ulcer disease.  The dominant symptom is heartburn or acid reflux.  An H-2 blocker medication is not currently being taken.  She has no history of a positive H. Pylori serology.  A prior EGD has been done which showed moderate or severe esophagitis.     Current Allergies: ! CODEINE ! CIPRO ! ULTRAM ! RANITIDINE HCL (RANITIDINE HCL)  Past Medical History:    Reviewed history from 10/18/2006 and no changes required:       Hypertension       Diverticulosis, colon       GERD  Past Surgical History:    Reviewed history from 11/13/2006 and no changes required:       Tonsillectomy       Appendectomy       knee surgery bilateral       umbilical hernia   Family History:    Reviewed history from 10/18/2006 and no changes required:       Fam hx Crohn's dz       Fam hx polyps       Family History Diabetes 1st degree relative       Family Hsitory Headaches       Family History  Hypertension  Social History:    Reviewed history from 10/18/2006 and no changes required:       Never Smoked       Alcohol use-no       Regular exercise-yes    Review of Systems       The patient complains of chest pain, abdominal pain, and severe indigestion/heartburn.  The patient denies weight loss, hoarseness, melena, hematochezia, difficulty walking, unusual weight change, abnormal bleeding, enlarged lymph nodes, and angioedema.     Physical Exam  General:     Well-developed,well-nourished,in no acute distress; alert,appropriate and cooperative throughout examination Lungs:     Normal respiratory effort, chest expands symmetrically. Lungs are clear to auscultation, no crackles or wheezes. Heart:     Normal rate and regular rhythm. S1 and S2 normal without gallop, murmur, click, rub or other extra sounds. Abdomen:     Bowel sounds positive,abdomen soft and non-tender without masses, organomegaly or hernias noted. Msk:     No deformity or scoliosis noted of thoracic or  lumbar spine.   Neurologic:     No cranial nerve deficits noted. Station and gait are normal. Plantar reflexes are down-going bilaterally. DTRs are symmetrical throughout. Sensory, motor and coordinative functions appear intact.    Impression & Recommendations:  Problem # 1:  GERD (ICD-530.81) recuuretn buring in chest consistant with prior poresentation. EGD by [place] 2003? stricture, no Barrets no hx of H pylori The following medications were removed from the medication list:    Prilosec 20 Mg Cpdr (Omeprazole) ..... Once daily  Her updated medication list for this problem includes:    Zegerid 40-1100 Mg Caps (Omeprazole-sodium bicarbonate) ..... One by mouth at bedtime Trial of zegerid and reglan  q AC  Orders: TLB-CBC Platelet - w/Differential (85025-CBCD)   Problem # 2:  HYPOTHYROIDISM, PRIMARY (ICD-244.9)  Her updated medication list for this problem includes:    Synthroid 88 Mcg Tabs  (Levothyroxine sodium) ..... Once daily  Labs Reviewed: TSH: 2.04 (06/28/2006)    HgBA1c: 5.6 (07/26/2006) Chol: 200 (11/07/2006)   HDL: 46.9 (11/07/2006)   LDL: DEL (11/07/2006)   TG: 224 (11/07/2006)  Orders: TLB-TSH (Thyroid Stimulating Hormone) (84443-TSH)   Problem # 3:  HYPERTENSION (ICD-401.9)  Her updated medication list for this problem includes:    Hydrochlorothiazide 12.5 Mg Caps (Hydrochlorothiazide) ..... Once daily  BP today: 130/78 Prior BP: 140/80 (11/15/2006)  Labs Reviewed: Creat: 0.9 (06/15/2006) Chol: 200 (11/07/2006)   HDL: 46.9 (11/07/2006)   LDL: DEL (11/07/2006)   TG: 224 (11/07/2006)   Complete Medication List: 1)  Hydrochlorothiazide 12.5 Mg Caps (Hydrochlorothiazide) .... Once daily 2)  Synthroid 88 Mcg Tabs (Levothyroxine sodium) .... Once daily 3)  Xanax 0.25 Mg Tabs (Alprazolam) .... As needed 4)  Zegerid 40-1100 Mg Caps (Omeprazole-sodium bicarbonate) .... One by mouth at bedtime 5)  Klor-con 10 10 Meq Tbcr (Potassium chloride) .... Once daily 6)  Calcium 500/d 500-200 Mg-unit Tabs (Calcium carbonate-vitamin d) .... 2 a day 7)  Vytorin 10-20 Mg Tabs (Ezetimibe-simvastatin) .... Once daily 8)  Reglan 5 Mg Tabs (Metoclopramide hcl) .... One by mouth q ac   Patient Instructions: 1)  Please schedule a follow-up appointment in 2 months. 2)  call if the pain does not subside within one week.    Prescriptions: REGLAN 5 MG  TABS (METOCLOPRAMIDE HCL) one by mouth q AC  #90 x 3   Entered and Authorized by:   Stacie Glaze MD   Signed by:   Stacie Glaze MD on 02/14/2007   Method used:   Electronically sent to ...       [facility]  [address] [id number]       [address]       [place], [place]  [address]       Ph: [phone] or [phone]       Fax: [phone]   RxID:   [id number]  ]  Appended Document: Orders Update     Clinical Lists Changes  Orders: Added new Service order of Venipuncture  [phone]) - Signed

## 2010-05-04 NOTE — Progress Notes (Signed)
Summary: med reaction  Medications Added NYSTATIN 100000 UNIT/ML SUSP (NYSTATIN) 10 ml swish and swallow qid       Phone Note Call from Patient Call back at [phone]   Caller: Patient Call For: Russella Dar Reason for Call: Talk to Nurse Summary of Call: Patient states that she is having a reaction to meds given to her last week ( Amoxtr-kcid 600) Initial call taken by: Tawni Levy,  May 20, 2009 11:28 AM  Follow-up for Phone Call        Patient feels she is getting thrush from Augmentin she says she has a white coating on her tongue and a sore throat.  Patient  says she frequently gets thrush when she takes antibiotics.  Please advise  Follow-up by: Darcey Nora RN, CGRN,  May 20, 2009 11:34 AM  Additional Follow-up for Phone Call Additional follow up Details #1::        Mycostatin swish and swallow qid for 10 days Additional Follow-up by: Meryl Dare MD Clementeen Graham,  May 20, 2009 12:02 PM    New/Updated Medications: NYSTATIN 100000 UNIT/ML SUSP (NYSTATIN) 10 ml swish and swallow qid Prescriptions: NYSTATIN 100000 UNIT/ML SUSP (NYSTATIN) 10 ml swish and swallow qid  #400 ml x 0   Entered by:   Darcey Nora RN, CGRN   Authorized by:   Meryl Dare MD [facility]   Signed by:   Darcey Nora RN, CGRN on 05/20/2009   Method used:   Electronically to        [id number] (retail)       [address]       [place], [place]  [address]       Ph: [phone] or [phone]       Fax: [phone]   RxID:   [id number]

## 2010-05-04 NOTE — Progress Notes (Signed)
Summary: Sinus Infection  Phone Note Call from Patient Call back at Home Phone [phone] Call back at [phone]   Caller: Patient Summary of Call: Patient called this morning c/o running a low grade fever, blowing greenish/brown color out her nose, and a horrible headache. Patient is sure she has a sinus infection and would like a generic zpack called in for her. This has been going on since last week. Please advise.    Pharmacy: [facility] on [address]  Initial call taken by: Harold Barban,  February 15, 2010 11:38 AM  Follow-up for Phone Call        zpack ok Follow-up by: Stacie Glaze MD,  February 15, 2010 2:30 PM    New/Updated Medications: ZITHROMAX Z-PAK 250 MG TABS (AZITHROMYCIN) as directed Prescriptions: ZITHROMAX Z-PAK 250 MG TABS (AZITHROMYCIN) as directed  #6 x 0   Entered by:   Lynann Beaver CMA AAMA   Authorized by:   Stacie Glaze MD   Signed by:   Lynann Beaver CMA AAMA on 02/15/2010   Method used:   Electronically to        [facility]  [address] [id number] (retail)       [address]       [place], [place]  [address]       Ph: [phone] or [phone]       Fax: [phone]   RxID:   [id number]  Pt. notified.

## 2010-05-04 NOTE — Progress Notes (Signed)
Summary: another rx  Phone Note Call from Patient Call back at Home Phone [phone]   Caller: vm Reason for Call: Acute Illness Summary of Call: [facility] does not have the Rx you sent in store, will be tomorrow 5pm,  that it is $55 and not covered by medicare.  They have other products that are just as good & not as expensive & I could get today.  Please call drugstore back and get Rx changed to something else I can get today & not as expensive.     Initial call taken by: Rudy Jew, RN,  Aug 04, 2009 3:06 PM  Follow-up for Phone Call        called poharmacy and changed to generic Follow-up by: Willy Eddy, LPN,  Aug 05, 979 3:20 PM

## 2010-05-04 NOTE — Consult Note (Signed)
Summary: Vania Rea. Warren Danes, DDS  Vania Rea Warren Danes, DDS   Imported By: Maryln Gottron 07/10/2008 15:07:29  _____________________________________________________________________  External Attachment:    Type:   Image     Comment:   External Document

## 2010-05-04 NOTE — Assessment & Plan Note (Signed)
Summary: UTI sx, schedule UTI lab work/nn   Vital Signs:  Patient Profile:   77 Years Old Female Height:     64 inches Temp:     98.2 degrees F oral Pulse rate:   76 / minute Resp:     14 per minute BP sitting:   130 / 70  (left arm)  Vitals Entered By: Willy Eddy, LPN (July 08, 3873 2:42 PM)                 Chief Complaint:  co lower abd pain and urinary frequency.  History of Present Illness: symptoms started Friday with buring and increased over the weekend to have lower abdominal pain  Hypertension History:      She denies headache, chest pain, palpitations, dyspnea with exertion, orthopnea, PND, peripheral edema, visual symptoms, neurologic problems, syncope, and side effects from treatment.        Positive major cardiovascular risk factors include female age 57 years old or older, hyperlipidemia, and hypertension.  Negative major cardiovascular risk factors include non-tobacco-user status.       Current Allergies: ! CODEINE ! CIPRO ! ULTRAM ! RANITIDINE HCL (RANITIDINE HCL)  Past Medical History:    Reviewed history from 10/18/2006 and no changes required:       Hypertension       Diverticulosis, colon       GERD  Past Surgical History:    Reviewed history from 11/13/2006 and no changes required:       Tonsillectomy       Appendectomy       knee surgery bilateral       umbilical hernia   Family History:    Reviewed history from 10/18/2006 and no changes required:       Fam hx Crohn's dz       Fam hx polyps       Family History Diabetes 1st degree relative       Family Hsitory Headaches       Family History Hypertension  Social History:    Reviewed history from 10/18/2006 and no changes required:       Never Smoked       Alcohol use-no       Regular exercise-yes    Review of Systems       The patient complains of abdominal pain, hematuria, and incontinence.  The patient denies anorexia, fever, weight loss, weight gain, vision loss,  decreased hearing, hoarseness, chest pain, syncope, dyspnea on exhertion, peripheral edema, prolonged cough, hemoptysis, melena, hematochezia, severe indigestion/heartburn, genital sores, muscle weakness, suspicious skin lesions, transient blindness, difficulty walking, depression, unusual weight change, abnormal bleeding, enlarged lymph nodes, angioedema, and breast masses.       Impression & Recommendations:  Problem # 1:  UTI (ICD-599.0)  Encouraged to push clear liquids, get enough rest, and take acetaminophen as needed. To be seen in 10 days if no improvement, sooner if worse.  Her updated medication list for this problem includes:    Amoxil 500 Mg Caps (Amoxicillin) ..... One by mouth tid  Orders: T-Culture, Urine ([id number])   Problem # 2:  HYPERTENSION (ICD-401.9)  Her updated medication list for this problem includes:    Hydrochlorothiazide 12.5 Mg Caps (Hydrochlorothiazide) ..... Once daily  BP today: 130/70 Prior BP: 136/70 (06/28/2007)  10 Yr Risk Heart Disease: Not enough information  Labs Reviewed: Creat: 0.9 (06/28/2007) Chol: 217 (06/28/2007)   HDL: 52.1 (06/28/2007)   LDL: DEL (06/28/2007)   TG:  213 (06/28/2007)   Complete Medication List: 1)  Hydrochlorothiazide 12.5 Mg Caps (Hydrochlorothiazide) .... Once daily 2)  Synthroid 88 Mcg Tabs (Levothyroxine sodium) .... Once daily 3)  Xanax 0.25 Mg Tabs (Alprazolam) .... As needed 4)  Klor-con 10 10 Meq Tbcr (Potassium chloride) .... Once daily 5)  Calcium 500/d 500-200 Mg-unit Tabs (Calcium carbonate-vitamin d) .... 2 a day 6)  Vytorin 10-20 Mg Tabs (Ezetimibe-simvastatin) .... Once daily 7)  Zantac 300 Mg Tabs (Ranitidine hcl) .... One by mouth q hs 8)  Nexium 40 Mg Cpdr (Esomeprazole magnesium) .... One by mouth q am 9)  Reglan 5 Mg Tabs (Metoclopramide hcl) .... One by mouth qhs 10)  Amoxil 500 Mg Caps (Amoxicillin) .... One by mouth tid  Hypertension Assessment/Plan:      The patient's hypertensive  risk group is category B: At least one risk factor (excluding diabetes) with no target organ damage.  Today's blood pressure is 130/70.  Her blood pressure goal is < 140/90.   Patient Instructions: 1)  Take your antibiotic as prescribed until ALL of it is gone, but stop if you develop a rash or swelling and contact our office as soon as possible.    Prescriptions: AMOXIL 500 MG  CAPS (AMOXICILLIN) one by mouth TID  #30 x 0   Entered and Authorized by:   Stacie Glaze MD   Signed by:   Stacie Glaze MD on 07/09/2007   Method used:   Electronically sent to ...       [facility]  [address] [id number]       [address]       [place], [place]  [address]       Ph: [phone] or [phone]       Fax: [phone]   RxID:   [id number]  ]

## 2010-05-04 NOTE — Progress Notes (Signed)
Summary: Phone note  Phone Note Call from Patient   Reason for Call: Acute Illness Summary of Call: Patient requesting results of blood work done July 28, 2008. Patient also requesting a copy of the report. Initial call taken by: Darra Lis RMA,  Aug 20, 2008 9:48 AM  Follow-up for Phone Call        left message on machine and copy ou t fron t for pick up m Follow-up by: Willy Eddy, LPN,  Aug 20, 2008 4:46 PM

## 2010-05-04 NOTE — Assessment & Plan Note (Signed)
Summary: [facility] MR#:  [id number] Page #  NAME:  Rebecca [name], Rebecca [name]  OFFICE NO:  [id number]  DATE:  06/16/05  DOB:  05-23-2032  PROBLEMS:   1.  Pyrosis. 2.  Rectal bleeding.  HISTORY OF PRESENT ILLNESS: The patient is a pleasant 77 year old white female referred through the courtesy of Dr. Lovell Sheehan for evaluation.  She has a history of an esophageal stricture and reflux.  She recently stopped her Nexium and developed severe burning discomfort in the back of her throat.  Since resuming Nexium approximately 1 week ago the symptoms have improved, though they remain.  She denies dysphagia.  She has occasional bright red blood per rectum when she passes a hard stool.  There has been no change in her bowel habits.  Last colonoscopy in 2001 demonstrated diverticulosis and a diminutive hyperplastic polyp.    PAST MEDICAL HISTORY: Pertinent for hypertension.  FAMILY HISTORY: Pertinent for a brother with polyps and a sister and daughter with polyps.  A niece has Crohn's disease.  MEDICATIONS: Include Nexium 40 mg a day, Lopid, hydrochlorothiazide, multivitamin.  ALLERGIES:  She is allergic to codeine and Cipro.  SOCIAL HISTORY: She neither smokes nor drinks.  She is widowed.  She works as a Loss adjuster, chart[employer].    REVIEW OF SYSTEMS:  Positive for joint pains.  PHYSICAL EXAMINATION: Pulse is 68.  Blood pressure is 142/80.  Weight is 123.  HEENT: EOMI, PERRLA.  Sclerae are anicteric.  Conjunctivae are pink.  Neck: Supple without thyromegaly, adenopathy, or carotid bruits.  Chest: Clear to auscultation and percussion without adventitious sounds.  Cardiac: Regular rhythm, normal S1 and S2.  There are no murmurs, gallops, or rubs.  Abdomen: Bowel sounds are normoactive.  Abdomen is soft, nontender, and nondistended.  There are no abdominal masses, tenderness, splenic enlargement, or hepatomegaly.  Extremities: Full range of motion.  No cyanosis, clubbing, or edema.  Rectal: Deferred.  IMPRESSION:  1.   Gastroesophageal reflux disease. 2.  History of esophageal stricture - asymptomatic. 3.  Limited rectal bleeding - probably secondary to hemorrhoids. 4.  Diverticulosis.  RECOMMENDATIONS: 1.  Continue Nexium. 2.  Colonoscopy.       Barbette Hair. Arlyce Dice, M.D., F.A.C.G.  [id number] cc:  Darryll Capers, MD        Artist Pais, MD D:  06/16/05; T:  ; Job [id number]

## 2010-05-04 NOTE — Letter (Signed)
Summary: [facility]  [facility]   Imported By: Maryln Gottron 01/30/2009 15:42:25  _____________________________________________________________________  External Attachment:    Type:   Image     Comment:   External Document

## 2010-05-04 NOTE — Letter (Signed)
Summary: [facility] Instructions  [facility]  [address] [place], [place] [address]   Phone: [phone]  Fax: [phone]       KALEEAH GINGERICH    04-28-1932    MRN: [id number]        Procedure Day /Date: Tuesday February 8th, 2011     Arrival Time: 1:30pm     Procedure Time: 2:30pm     Location of Procedure:                    _x _  [facility] (4th Floor)                        PREPARATION FOR COLONOSCOPY WITH MOVIPREP   Starting 5 days prior to your procedure  05/07/09 do not eat nuts, seeds, popcorn, corn, beans, peas,  salads, or any raw vegetables.  Do not take any fiber supplements (e.g. Metamucil, Citrucel, and Benefiber).  THE DAY BEFORE YOUR PROCEDURE         DATE:  05/11/09   DAY:  Monday   1.  Drink clear liquids the entire day-NO SOLID FOOD  2.  Do not drink anything colored red or purple.  Avoid juices with pulp.  No orange juice.  3.  Drink at least 64 oz. (8 glasses) of fluid/clear liquids during the day to prevent dehydration and help the prep work efficiently.  CLEAR LIQUIDS INCLUDE: Water Jello Ice Popsicles Tea (sugar ok, no milk/cream) Powdered fruit flavored drinks Coffee (sugar ok, no milk/cream) Gatorade Juice: apple, white grape, white cranberry  Lemonade Clear bullion, consomm, broth Carbonated beverages (any kind) Strained chicken noodle soup Hard Candy                             4.  In the morning, mix first dose of MoviPrep solution:    Empty 1 Pouch A and 1 Pouch B into the disposable container    Add lukewarm drinking water to the top line of the container. Mix to dissolve    Refrigerate (mixed solution should be used within 24 hrs)  5.  Begin drinking the prep at 5:00 p.m. The MoviPrep container is divided by 4 marks.   Every 15 minutes drink the solution down to the next mark (approximately 8 oz) until the full liter is complete.   6.  Follow completed prep with 16 oz of clear liquid of your  choice (Nothing red or purple).  Continue to drink clear liquids until bedtime.  7.  Before going to bed, mix second dose of MoviPrep solution:    Empty 1 Pouch A and 1 Pouch B into the disposable container    Add lukewarm drinking water to the top line of the container. Mix to dissolve    Refrigerate  THE DAY OF YOUR PROCEDURE      DATE:   05/12/09 DAY:  Tuesday  Beginning at  9:30 a.m. (5 hours before procedure):         1. Every 15 minutes, drink the solution down to the next mark (approx 8 oz) until the full liter is complete.  2. Follow completed prep with 16 oz. of clear liquid of your choice.    3. You may drink clear liquids until  12:30pm  (2 HOURS BEFORE PROCEDURE).   MEDICATION INSTRUCTIONS  Unless otherwise instructed, you should take regular prescription medications with a small sip of  water   as early as possible the morning of your procedure.  Hold Iron 5 days before procedure.        OTHER INSTRUCTIONS  You will need a responsible adult at least 75 years of age to accompany you and drive you home.   This person must remain in the waiting room during your procedure.  Wear loose fitting clothing that is easily removed.  Leave jewelry and other valuables at home.  However, you may wish to bring a book to read or  an iPod/MP3 player to listen to music as you wait for your procedure to start.  Remove all body piercing jewelry and leave at home.  Total time from sign-in until discharge is approximately 2-3 hours.  You should go home directly after your procedure and rest.  You can resume normal activities the  day after your procedure.  The day of your procedure you should not:   Drive   Make legal decisions   Operate machinery   Drink alcohol   Return to work  You will receive specific instructions about eating, activities and medications before you leave.    The above instructions have been reviewed and explained to me by    _______________________    I fully understand and can verbalize these instructions _____________________________ Date _________

## 2010-05-04 NOTE — Progress Notes (Signed)
Summary: neuropathy  Phone Note Call from Patient   Caller: Patient Call For: Stacie Glaze MD Reason for Call: Talk to Doctor Summary of Call: Pt saw Dr. Victorino Dike [facility]), for foot pain, and was diagnoses with neuropathy......given Neurontin and B6.  Wants to now Dr. Lovell Sheehan thoughts on this and if he needs to see her, or how much B6 to take.  Leave message at her home, please.  [phone]  Initial call taken by: Lynann Beaver CMA,  September 02, 2009 1:44 PM  Follow-up for Phone Call        I would recommend folbee on a day as an rx Follow-up by: Stacie Glaze MD,  September 03, 2009 8:07 AM  Additional Follow-up for Phone Call Additional follow up Details #1::        left message on machine at home as pt requested Additional Follow-up by: Willy Eddy, LPN,  September 04, 2438 10:04 AM

## 2010-05-04 NOTE — Assessment & Plan Note (Signed)
Summary: 2 month rov/njr   Vital Signs:  Patient profile:   77 year old female Height:      64 inches Weight:      120 pounds BMI:     20.67 Temp:     98.2 degrees F oral Pulse rate:   64 / minute Resp:     14 per minute BP sitting:   160 / 80  (left arm)  Vitals Entered By: Willy Eddy, LPN (January 05, 2010 11:00 AM) CC: roa after stopping hctz for low bp and fatigue- h ad one inejction in back which helped at first , but no longerd, Hypertension Management Is Patient Diabetic? No   Primary Care Provider:  Darryll Capers, MD  CC:  roa after stopping hctz for low bp and fatigue- h ad one inejction in back which helped at first , but no longerd, and Hypertension Management.  History of Present Illness: The pt has seen [facility] for back pain and was referred  for injectons due to increase surgicla risks due  to preesisting scoliosis. Pt rates the  the numbness and tingling in the foot as stable ( not progressive) The pt  had a series of injections with Dr Danielle Dess  and would like to consider a second series of injections blood pressure was elevated at neurosurgeons and today. She was on HTCZ and this was stopped due to hypokalemia  Hypertension History:      She denies headache, chest pain, palpitations, dyspnea with exertion, orthopnea, PND, peripheral edema, visual symptoms, neurologic problems, syncope, and side effects from treatment.        Positive major cardiovascular risk factors include female age 60 years old or older, hyperlipidemia, and hypertension.  Negative major cardiovascular risk factors include non-tobacco-user status.        Positive history for target organ damage include peripheral vascular disease.  Further assessment for target organ damage reveals no history of ASHD or stroke/TIA.     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) 2)  Lumbar Radiculopathy, Atypical  (ICD-724.4) 3)  Unspecified Peripheral Vascular Disease  (ICD-443.9) 4)  Polyneuropathy Other Diseases Classified [place]  (ICD-357.4) 5)  Preventive Health Care  (ICD-V70.0) 6)  Adenomatous Colonic Polyp  (ICD-211.3) 7)  Diverticulitis-colon  (ICD-562.11) 8)  Anemia, Iron Deficiency  (ICD-280.9) 9)  Dysuria  (ICD-788.1) 10)  Nonspecific Abn Finding Rad & Oth Exam Gi Tract  (ICD-793.4) 11)  Diarrhea-presumed Infectious  (ICD-009.3) 12)  Diarrhea  (ICD-787.91) 13)  Anemia  (ICD-285.9) 14)  Carotid Artery Disease  (ICD-433.10) 15)  Gastroparesis  (ICD-536.3) 16)  Allergic Rhinitis Cause Unspecified  (ICD-477.9) 17)  Screening Colorectal-cancer  (ICD-V76.51) 18)  Dysphagia  (ICD-787.29) 19)  Ganglion of Tendon Sheath  (ICD-727.42) 20)  Weight Loss, Abnormal  (ICD-783.21) 21)  Abdominal Pain, Generalized  (ICD-789.07) 22)  Ganglion of Joint  (ICD-727.41) 23)  Chest Pain, Atypical  (ICD-786.59) 24)  Candidiasis, Oral  (ICD-112.0) 25)  Hypothyroidism, Primary  (ICD-244.9) 26)  Hyperlipidemia  (ICD-272.4) 27)  Advef, Drug/medicinal/biological Subst Nos  (ICD-995.20) 28)  Family History Diabetes 1st Degree Relative  (ICD-V18.0) 29)  Gerd  (ICD-530.81) 30)  Diverticulosis, Colon  (ICD-562.10) 31)  Hypertension  (ICD-401.9)  Current Problems (verified): 1)  Migraine, 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) 2)  Lumbar Radiculopathy, Atypical  (ICD-724.4) 3)  Unspecified Peripheral Vascular  Disease  (ICD-443.9) 4)  Polyneuropathy Other Diseases Classified [place]  (ICD-357.4) 5)  Preventive Health Care  (ICD-V70.0) 6)  Adenomatous Colonic Polyp  (ICD-211.3) 7)  Diverticulitis-colon  (ICD-562.11) 8)  Anemia, Iron Deficiency  (ICD-280.9) 9)  Dysuria  (ICD-788.1) 10)  Nonspecific Abn Finding Rad & Oth Exam Gi Tract  (ICD-793.4) 11)  Diarrhea-presumed Infectious  (ICD-009.3) 12)  Diarrhea  (ICD-787.91) 13)  Anemia   (ICD-285.9) 14)  Carotid Artery Disease  (ICD-433.10) 15)  Gastroparesis  (ICD-536.3) 16)  Allergic Rhinitis Cause Unspecified  (ICD-477.9) 17)  Screening Colorectal-cancer  (ICD-V76.51) 18)  Dysphagia  (ICD-787.29) 19)  Ganglion of Tendon Sheath  (ICD-727.42) 20)  Weight Loss, Abnormal  (ICD-783.21) 21)  Abdominal Pain, Generalized  (ICD-789.07) 22)  Ganglion of Joint  (ICD-727.41) 23)  Chest Pain, Atypical  (ICD-786.59) 24)  Candidiasis, Oral  (ICD-112.0) 25)  Hypothyroidism, Primary  (ICD-244.9) 26)  Hyperlipidemia  (ICD-272.4) 27)  Advef, Drug/medicinal/biological Subst Nos  (ICD-995.20) 28)  Family History Diabetes 1st Degree Relative  (ICD-V18.0) 29)  Gerd  (ICD-530.81) 30)  Diverticulosis, Colon  (ICD-562.10) 31)  Hypertension  (ICD-401.9)  Medications Prior to Update: 1)  Synthroid 88 Mcg Tabs (Levothyroxine Sodium) .... Once Daily 2)  Xanax 0.25 Mg Tabs (Alprazolam) .... As Needed 3)  Calcium 500/d 500-200 Mg-Unit  Tabs (Calcium Carbonate-Vitamin D) .... 2 A Day 4)  Vytorin 10-20 Mg  Tabs (Ezetimibe-Simvastatin) .... Once Daily 5)  Align   Caps (Misc Intestinal Flora Regulat) .... One By Mouth Daily 6)  Dexilant 60 Mg Cpdr (Dexlansoprazole) .[name] Kitchen.. 1 Once Daily 7)  Vitamin D 1200 .[name] Kitchen.. 1 Once Daily 8)  Voltaren 1 % Gel (Diclofenac Sodium) .... Use As Directed 9)  Gabapentin 100 Mg Caps (Gabapentin) .[name] Kitchen.. 1 Once Daily  Current Medications (verified): 1)  Synthroid 88 Mcg Tabs (Levothyroxine Sodium) .... Once Daily 2)  Xanax 0.25 Mg Tabs (Alprazolam) .... As Needed 3)  Calcium 500/d 500-200 Mg-Unit  Tabs (Calcium Carbonate-Vitamin D) .... 2 A Day 4)  Vytorin 10-20 Mg  Tabs (Ezetimibe-Simvastatin) .... Once Daily 5)  Align   Caps (Misc Intestinal Flora Regulat) .... One By Mouth Daily 6)  Dexilant 60 Mg Cpdr (Dexlansoprazole) .[name] Kitchen.. 1 Once Daily 7)  Vitamin D 1200 .[name] Kitchen.. 1 Once Daily 8)  Voltaren 1 % Gel (Diclofenac Sodium) .... Use As Directed 9)  Gabapentin 100 Mg Caps  (Gabapentin) .[name] Kitchen.. 1 Once Daily 10)  Benicar 20 Mg Tabs (Olmesartan Medoxomil) .... One By Mouth Daily  Allergies (verified): 1)  ! Codeine 2)  ! Cipro 3)  ! Ultram 4)  ! Pneumovax 23 (Pneumococcal Vac Polyvalent) 5)  ! * Levoquin 6)  ! Bactrim 7)  ! Adhesive Tape 1/2"x10yd (Adhesive Tape)  Past History:  Family History: Last updated: 06/10/2009 Fam hx Crohn's dz-niece Fam hx polyps-Brother, Sister, daughter Family History Diabetes 1st degree relative Family Hsitory Headaches Family History Hypertension Family History of Colon Cancer: ? MGM, and maternal aunt   Social History: Last updated: 06/10/2009 Retired  Never Smoked Alcohol use-no Regular exercise-yes  Risk Factors: Exercise: yes (10/18/2006)  Risk Factors: Smoking Status: never (01/05/2010)  Past medical, surgical, family and social histories (including risk factors) reviewed, and no changes noted (except as noted below).  Past Medical History: Reviewed history from 06/10/2009 and no changes required. Hypertension Diverticulosis, diverticulitis Esophageal Stricture Colon Polyps- Hyperplastic 06/2005 Anxiety Disorder Hemorrhoids Gastroparesis-49.6% solid retention at 2 hrs.  Anemia, fe deficiency  Past Surgical History: Reviewed history from 03/16/2009 and no changes required. Tonsillectomy Appendectomy  knee surgery bilateral umbilical hernia repair Salivary Gland removed Lt side double pallital tori bone removed mortensons neuroma on right foot and platar facial release 201o Oral surgery 2010 Foot surgery 2010  Family History: Reviewed history from 06/10/2009 and no changes required. Fam hx Crohn's dz-niece Fam hx polyps-Brother, Sister, daughter Family History Diabetes 1st degree relative Family Hsitory Headaches Family History Hypertension Family History of Colon Cancer: ? MGM, and maternal aunt   Social History: Reviewed history from 06/10/2009 and no changes required. Retired  Never  Smoked Alcohol use-no Regular exercise-yes  Review of Systems  The patient denies anorexia, fever, weight loss, weight gain, vision loss, decreased hearing, hoarseness, chest pain, syncope, dyspnea on exertion, peripheral edema, prolonged cough, headaches, hemoptysis, abdominal pain, melena, hematochezia, severe indigestion/heartburn, hematuria, incontinence, genital sores, muscle weakness, suspicious skin lesions, transient blindness, difficulty walking, depression, unusual weight change, abnormal bleeding, enlarged lymph nodes, angioedema, and breast masses.         Flu Vaccine Consent Questions     Do you have a history of severe allergic reactions to this vaccine? no    Any prior history of allergic reactions to egg and/or gelatin? no    Do you have a sensitivity to the preservative Thimersol? no    Do you have a past history of Guillan-Barre Syndrome? no    Do you currently have an acute febrile illness? no    Have you ever had a severe reaction to latex? no    Vaccine information given and explained to patient? yes    Are you currently pregnant? no    Lot Number:[id number]   Exp Date:10/02/2010   Site Given  Left Deltoid IM   Physical Exam  General:  Well-developed,well-nourished,in no acute distress; alert,appropriate and cooperative throughout examination Head:  normocephalic and atraumatic.   Eyes:  pupils equal and pupils round.   Ears:  R ear normal and L ear normal.   Mouth:  No deformity or lesions, dentition normal. Neck:  No deformities, masses, or tenderness noted. Lungs:  normal respiratory effort and no wheezes.   Heart:  normal rate and regular rhythm.     Impression & Recommendations:  Problem # 1:  HYPERTENSION (ICD-401.9) will begin arb and check renal function BP today: 160/80 Prior BP: 140/70 (10/29/2009)  Prior 10 Yr Risk Heart Disease: Not enough information (07/09/2007)  Labs Reviewed: K+: 3.6 (10/29/2009) Creat: : 0.8 (10/29/2009)   Chol: 201  (08/21/2009)   HDL: 53.00 (08/21/2009)   LDL: DEL (06/28/2007)   TG: 200.0 (08/21/2009)  Her updated medication list for this problem includes:    Benicar 20 Mg Tabs (Olmesartan medoxomil) ..... One by mouth daily  Problem # 2:  HYPOTHYROIDISM, PRIMARY (ICD-244.9)  Her updated medication list for this problem includes:    Synthroid 88 Mcg Tabs (Levothyroxine sodium) ..... Once daily  Labs Reviewed: TSH: 0.65 (08/21/2009)   Total T4: 9.0 (12/24/2007)    HgBA1c: 5.6 (07/26/2006) Chol: 201 (08/21/2009)   HDL: 53.00 (08/21/2009)   LDL: DEL (06/28/2007)   TG: 200.0 (08/21/2009)  Problem # 3:  ANEMIA, IRON DEFICIENCY (ICD-280.9) Assessment: Unchanged  Hgb: 12.4 (10/29/2009)   Hct: 36.5 (10/29/2009)   Platelets: 296.0 (10/29/2009) RBC: 3.85 (10/29/2009)   RDW: 13.5 (10/29/2009)   WBC: 11.8 (10/29/2009) MCV: 94.9 (10/29/2009)   MCHC: 34.1 (10/29/2009) Ferritin: 26.0 (03/16/2009) Iron: 79 (08/21/2009)   % Sat: 22.9 (08/21/2009) B12: 787 (03/16/2009)   Folate: >20.0 ng/mL (03/16/2009)   TSH: 0.65 (08/21/2009)  Complete Medication List: 1)  Synthroid 88 Mcg Tabs (Levothyroxine sodium) .... Once daily 2)  Xanax 0.25 Mg Tabs (Alprazolam) .... As needed 3)  Calcium 500/d 500-200 Mg-unit Tabs (Calcium carbonate-vitamin d) .... 2 a day 4)  Vytorin 10-20 Mg Tabs (Ezetimibe-simvastatin) .... Once daily 5)  Align Caps (Misc intestinal flora regulat) .... One by mouth daily 6)  Pantoprazole Sodium 40 Mg Tbec (Pantoprazole sodium) .... One by mouth daily 7)  Vitamin D 1200  .[name] Kitchen.. 1 once daily 8)  Voltaren 1 % Gel (Diclofenac sodium) .... Use as directed 9)  Gabapentin 100 Mg Caps (Gabapentin) .[name] Kitchen.. 1 once daily 10)  Benicar 20 Mg Tabs (Olmesartan medoxomil) .... One by mouth daily  Other Orders: Flu Vaccine 86yrs + MEDICARE PATIENTS (Z6109) Administration Flu vaccine - MCR (U0454) Neurosurgeon Referral (Neurosurgeon)  Hypertension Assessment/Plan:      The patient's hypertensive risk group is  category C: Target organ damage and/or diabetes.  Today's blood pressure is 160/80.  Her blood pressure goal is < 140/90.  Patient Instructions: 1)  Please schedule a follow-up appointment in 2 months. Prescriptions: PANTOPRAZOLE SODIUM 40 MG TBEC (PANTOPRAZOLE SODIUM) one by mouth daily  #30 x 11   Entered and Authorized by:   Stacie Glaze MD   Signed by:   Stacie Glaze MD on 01/05/2010   Method used:   Electronically to        [facility]  [address] # [id number]* (retail)       [address]       [place], [place]  [address]       Ph: [phone] or [phone]       Fax: [phone]   RxID:   [id number]

## 2010-05-04 NOTE — Assessment & Plan Note (Signed)
Summary: 6 month roa/jls/pt rescd//ccm   Vital Signs:  Patient profile:   77 year old female Height:      64 inches Weight:      122 pounds BMI:     21.02 Temp:     98.1 degrees F oral Pulse rate:   72 / minute Resp:     14 per minute BP sitting:   122 / 70  (left arm)  Vitals Entered By: Willy Eddy, LPN (March 03, 2009 9:39 AM) CC: roa- c/o colitis flare up   Primary Care Provider:  Darryll Capers, MD  CC:  roa- c/o colitis flare up.  History of Present Illness: Saw the urologist and was tests woith negative urine but positive spasm was placed on macrdantin 50 mg daily. Renal US negative. Having a flair of colitis ( rencent foot surgery) increased GI pain and cramping only antibiotic is macrodatin no other medications changes ( did recently take the bactrim for UTI) was in the ER Oct 16 with low sodium and heart palpitatons this was felt to be due to the medications hs of umbilical hernia repair pain is decribed as all insides sore   Preventive Screening-Counseling & Management  Alcohol-Tobacco     Smoking Status: never  Problems Prior to Update: 1)  Carotid Artery Disease  (ICD-433.10) 2)  Gastroparesis  (ICD-536.3) 3)  Allergic Rhinitis Cause Unspecified  (ICD-477.9) 4)  Screening Colorectal-cancer  (ICD-V76.51) 5)  Dysphagia  (ICD-787.29) 6)  Ganglion of Tendon Sheath  (ICD-727.42) 7)  Weight Loss, Abnormal  (ICD-783.21) 8)  Abdominal Pain, Generalized  (ICD-789.07) 9)  Ganglion of Joint  (ICD-727.41) 10)  Chest Pain, Atypical  (ICD-786.59) 11)  Candidiasis, Oral  (ICD-112.0) 12)  Hypothyroidism, Primary  (ICD-244.9) 13)  Otitis Externa, Acute Nec  (ICD-380.22) 14)  Uti  (ICD-599.0) 15)  Hyperlipidemia  (ICD-272.4) 16)  Advef, Drug/medicinal/biological Subst Nos  (ICD-995.20) 17)  Family History Diabetes 1st Degree Relative  (ICD-V18.0) 18)  Gerd  (ICD-530.81) 19)  Diverticulosis, Colon  (ICD-562.10) 20)  Hypertension   (ICD-401.9)  Medications Prior to Update: 1)  Hydrochlorothiazide 12.5 Mg Caps (Hydrochlorothiazide) .... Once Daily 2)  Synthroid 88 Mcg Tabs (Levothyroxine Sodium) .... Once Daily 3)  Xanax 0.25 Mg Tabs (Alprazolam) .... As Needed 4)  Klor-Con 10 10 Meq  Tbcr (Potassium Chloride) .... Once Daily 5)  Calcium 500/d 500-200 Mg-Unit  Tabs (Calcium Carbonate-Vitamin D) .... 2 A Day 6)  Vytorin 10-20 Mg  Tabs (Ezetimibe-Simvastatin) .... Once Daily 7)  Align   Caps (Misc Intestinal Flora Regulat) .... One By Mouth Daily 8)  Kapidex 60 Mg Cpdr (Dexlansoprazole) .[name] Kitchen.. 1 Tablet By Mouth Once Daily 9)  Reglan 5 Mg Tabs (Metoclopramide Hcl) .[name] Kitchen.. 1 Before Dinner 10)  Klor-Con 8 Meq Cr-Tabs (Potassium Chloride) .[name] Kitchen.. 1 Once Daily 11)  Amoxicillin 500 Mg Caps (Amoxicillin) .... Take One (1) By Mouth Three Times A Day 12)  Bactrim Ds 800-160 Mg Tabs (Sulfamethoxazole-Trimethoprim) .[name] Kitchen.. 1 Two Times A Day For 10 Days  Current Medications (verified): 1)  Hydrochlorothiazide 12.5 Mg Caps (Hydrochlorothiazide) .... Once Daily 2)  Synthroid 88 Mcg Tabs (Levothyroxine Sodium) .... Once Daily 3)  Xanax 0.25 Mg Tabs (Alprazolam) .... As Needed 4)  Klor-Con 10 10 Meq  Tbcr (Potassium Chloride) .... Once Daily 5)  Calcium 500/d 500-200 Mg-Unit  Tabs (Calcium Carbonate-Vitamin D) .... 2 A Day 6)  Vytorin 10-20 Mg  Tabs (Ezetimibe-Simvastatin) .... Once Daily 7)  Align   Caps (Misc Intestinal Flora Regulat) .... One  By Mouth Daily 8)  Dexilant 60 Mg Cpdr (Dexlansoprazole) .[name] Kitchen.. 1 Once Daily 9)  Reglan 5 Mg Tabs (Metoclopramide Hcl) .[name] Kitchen.. 1 Before Dinner 10)  Klor-Con 8 Meq Cr-Tabs (Potassium Chloride) .[name] Kitchen.. 1 Once Daily 11)  Vitamin D 1200 .[name] Kitchen.. 1 Once Daily 12)  Macrodantin 50 Mg Caps (Nitrofurantoin Macrocrystal) .[name] Kitchen.. 1 Once Daily  Allergies (verified): 1)  ! Codeine 2)  ! Cipro 3)  ! Ultram 4)  ! Pneumovax 23 (Pneumococcal Vac Polyvalent) 5)  ! * Levoquin 6)  ! Bactrim  Past History:  Family  History: Last updated: 03/11/2008 Fam hx Crohn's dz-niece Fam hx polyps-Brother, Sister, daughter Family History Diabetes 1st degree relative Family Hsitory Headaches Family History Hypertension  Social History: Last updated: 10/18/2006 Never Smoked Alcohol use-no Regular exercise-yes  Risk Factors: Exercise: yes (10/18/2006)  Risk Factors: Smoking Status: never (03/03/2009)  Past medical, surgical, family and social histories (including risk factors) reviewed, and no changes noted (except as noted below).  Past Medical History: Reviewed history from 07/22/2008 and no changes required. Hypertension Diverticulosis, colon GERD Esophageal Stricture Colon Polyps- Hyperplastic 06/2005 Anxiety Disorder Hemorrhoids Gastroparesis-49.6% solid retention at 2 hrs.   Past Surgical History: Tonsillectomy Appendectomy knee surgery bilateral umbilical hernia repair Salivary Gland removed Lt side double pallital tori bone removed mortensons neuroma on right foot and platar facial release 201o  Family History: Reviewed history from 03/11/2008 and no changes required. Fam hx Crohn's dz-niece Fam hx polyps-Brother, Sister, daughter Family History Diabetes 1st degree relative Family Hsitory Headaches Family History Hypertension  Social History: Reviewed history from 10/18/2006 and no changes required. Never Smoked Alcohol use-no Regular exercise-yes  Review of Systems       The patient complains of anorexia and abdominal pain.  The patient denies fever, weight loss, weight gain, vision loss, decreased hearing, hoarseness, chest pain, syncope, dyspnea on exertion, peripheral edema, prolonged cough, headaches, hemoptysis, melena, hematochezia, severe indigestion/heartburn, hematuria, incontinence, genital sores, muscle weakness, suspicious skin lesions, transient blindness, difficulty walking, depression, unusual weight change, abnormal bleeding, enlarged lymph nodes, angioedema,  and breast masses.    Physical Exam  General:  alert and pale.   Head:  normocephalic and atraumatic.   Eyes:  pupils equal and pupils round.   Ears:  R ear normal and L ear normal.   Neck:  No deformities, masses, or tenderness noted. Lungs:  normal respiratory effort and no wheezes.   Heart:  normal rate and regular rhythm.   Abdomen:  soft, no guarding, no rigidity, no rebound tenderness, no abdominal hernia, and epigastric tenderness.   Msk:  no joint swelling and no joint warmth.   Neurologic:  alert & oriented X3 and cranial nerves II-XII intact.     Impression & Recommendations:  Problem # 1:  ABDOMINAL PAIN, GENERALIZED (ICD-789.07)  generalized accross the mid epigastric area not in the LLQ or RUQ has appendectomy Her updated medication list for this problem includes:    Reglan 5 Mg Tabs (Metoclopramide hcl) .[name] Kitchen... 1 before dinner hold the macrobid for 1 weeks  Discussed symptom control with the patient.   Orders: TLB-CBC Platelet - w/Differential (85025-CBCD) TLB-Hepatic/Liver Function Pnl (80076-HEPATIC) TLB-Amylase (82150-AMYL)  Problem # 2:  GASTROPARESIS (ICD-536.3) on reglan and dexilant  Problem # 3:  HYPERLIPIDEMIA (ICD-272.4)  Her updated medication list for this problem includes:    Vytorin 10-20 Mg Tabs (Ezetimibe-simvastatin) ..... Once daily  Labs Reviewed: SGOT: 20 (07/28/2008)   SGPT: 15 (07/28/2008)  Lipid Goals: Chol Goal: 200 (07/28/2008)   HDL  Goal: 40 (07/28/2008)   LDL Goal: 130 (07/28/2008)   TG Goal: 150 (07/28/2008)  Prior 10 Yr Risk Heart Disease: Not enough information (07/09/2007)   HDL:49.90 (07/28/2008), 52.1 (06/28/2007)  LDL:DEL (06/28/2007), DEL (11/07/2006)  Chol:209 (07/28/2008), 217 (06/28/2007)  Trig:155.0 (07/28/2008), 213 (06/28/2007)  Complete Medication List: 1)  Hydrochlorothiazide 12.5 Mg Caps (Hydrochlorothiazide) .... Once daily 2)  Synthroid 88 Mcg Tabs (Levothyroxine sodium) .... Once daily 3)  Xanax 0.25 Mg  Tabs (Alprazolam) .... As needed 4)  Klor-con 10 10 Meq Tbcr (Potassium chloride) .... Once daily 5)  Calcium 500/d 500-200 Mg-unit Tabs (Calcium carbonate-vitamin d) .... 2 a day 6)  Vytorin 10-20 Mg Tabs (Ezetimibe-simvastatin) .... Once daily 7)  Align Caps (Misc intestinal flora regulat) .... One by mouth daily 8)  Dexilant 60 Mg Cpdr (Dexlansoprazole) .[name] Kitchen.. 1 once daily 9)  Reglan 5 Mg Tabs (Metoclopramide hcl) .[name] Kitchen.. 1 before dinner 10)  Klor-con 8 Meq Cr-tabs (Potassium chloride) .[name] Kitchen.. 1 once daily 11)  Vitamin D 1200  .[name] Kitchen.. 1 once daily 12)  Macrodantin 50 Mg Caps (Nitrofurantoin macrocrystal) .[name] Kitchen.. 1 once daily 13)  Metronidazole 250 Mg Tabs (Metronidazole) .... One by mouth qid for 7 days  Other Orders: Venipuncture (16109) TLB-BMP (Basic Metabolic Panel-BMET) (80048-METABOL)  Patient Instructions: 1)  if the pain goes away and resumes when yyou resume the macrobid then we must add that to your list 2)  if the pain does not go away then call back this week for an abdominal CT ( has [place] at GU negative) 3)  if your develop fever of chill call!!! 4)  Please schedule a follow-up appointment in 2 months. Prescriptions: METRONIDAZOLE 250 MG TABS (METRONIDAZOLE) one by mouth QID for 7 days  #28 x 0   Entered and Authorized by:   Stacie Glaze MD   Signed by:   Stacie Glaze MD on 03/03/2009   Method used:   Electronically to        [facility]  [address] # [id number]* (retail)       [address]       [place], [place]  [address]       Ph: [phone] or [phone]       Fax: [phone]   RxID:   [id number]

## 2010-05-04 NOTE — Miscellaneous (Signed)
Summary: Orders Update   Clinical Lists Changes  Observations: Added new observation of FLU VAX: State Fluvax Nasal (12/16/2008 17:50)      Immunization History:  Influenza Immunization History:    Influenza:  state fluvax nasal (12/16/2008)

## 2010-05-04 NOTE — Progress Notes (Signed)
Summary: stomach emptying test results   Phone Note Call from Patient Call back at [phone]   Caller: Patient Call For: Russella Dar Reason for Call: Lab or Test Results Details for Reason: stomach emptying test results Summary of Call: recent stomach emptying test and endo... pt was told that Russella Dar would go over the gastric emptying results with her at her endo, but she was alseep when she saw him at her endo and did not get the gastric emptying test results... would like to have these results... doesnt know if she needs to come in for a followup Initial call taken by: Vallarie Mare,  April 16, 2008 1:38 PM  Follow-up for Phone Call        reviewed with patient her GES results.  She will call back to schedule a REV for 3 months post EGD Follow-up by: Darcey Nora RN,  April 16, 2008 1:53 PM

## 2010-05-04 NOTE — Miscellaneous (Signed)
Summary: Orders Update  Clinical Lists Changes  Orders: Added new Test order of Carotid Duplex (Carotid Duplex) - Signed 

## 2010-05-04 NOTE — Consult Note (Signed)
Summary: [facility]  [facility]   Imported By: Maryln Gottron 11/27/2009 12:45:55  _____________________________________________________________________  External Attachment:    Type:   Image     Comment:   External Document

## 2010-05-04 NOTE — Op Note (Signed)
Summary: [facility]  [facility]   Imported By: Sherian Rein 02/16/2010 13:49:14  _____________________________________________________________________  External Attachment:    Type:   Image     Comment:   External Document

## 2010-05-04 NOTE — Progress Notes (Signed)
Summary: Really wants med for UTI  Phone Note Call from Patient Call back at Home Phone [phone]   Caller: Patient Call For: Misheel Gowans Reason for Call: Acute Illness, Talk to Nurse Summary of Call: Pt requesting to speak with Bonnye concerning Bactrim antiobiotic that she was prescibed last week, having a problem with it.  Has made her very nauseous & shaky like Cipro & Levaquin does.  [facility] ER Sat & they stopped med. & did urine, no infection.  To see urologist Dr. Vicie Mutters.  Do you want to put me on something else, she would hate for it to flair up again.  Not sure but she doesn't think ER would do culture.  Doctor Er told her there is a proerty in Bactrim that is in Cipro & Levaquin, and said Macrobid might be a choice for her.  Seems to her she might have taken it a couple years ago.  RA [place].  Cannot take cipro,levaquin, now bactrim Initial call taken by: Trixie Dredge,  January 19, 2009 10:45 AM  Follow-up for Phone Call        hold rx until she sees the GU Follow-up by: Stacie Glaze MD,  January 19, 2009 11:19 AM  Additional Follow-up for Phone Call Additional follow up Details #1::        sHE ALWAYS THOUGHT IF SHE STOPPED ANTIBIOTIC IN MIDDLE OF INFEC TION.  sHE WOULD REALLY LIKE TO TAKE PRECAUTION.   Additional Follow-up by: Rudy Jew, RN,  January 19, 2009 2:52 PM    Additional Follow-up for Phone Call Additional follow up Details #2::    per dr Lovell Sheehan --stop antibioitc- asymptomatic-states sodium was low in hospital- encouraged to drink gartorade and not water- be sure and keep appointment with urologist on thursday and call us if anymore problems Follow-up by: Willy Eddy, LPN,  January 19, 2009 3:45 PM

## 2010-05-04 NOTE — Progress Notes (Signed)
Summary: lab results questions  Phone Note Call from Patient Call back at [phone]   Reason for Call: Lab or Test Results Summary of Call: Should I continue iron, potassium, and wants to be sure she's not prediabetic since foot has progressively gotten numb.   Initial call taken by: Rudy Jew, RN,  Aug 28, 2009 10:06 AM  Follow-up for Phone Call        stop the K while off the "water pill" the rest of the labs were normal  no evidence of diabetes consider taking b12 suppliment for feet  ( b12 b6 and folate otc as directed  on bottle) Follow-up by: Stacie Glaze MD,  September 02, 2009 8:36 AM  Additional Follow-up for Phone Call Additional follow up Details #1::        Pt given Dr. Lovell Sheehan" recommendations. Additional Follow-up by: Lynann Beaver CMA,  September 02, 2009 8:41 AM

## 2010-05-04 NOTE — Progress Notes (Signed)
Summary: Question about meds   Phone Note Call from Patient Call back at cell [phone]   Call For: Dr Russella Dar Summary of Call: Question about the medicine Amoxicillin  Initial call taken by: Leanor Kail [facility],  May 14, 2009 8:39 AM  Follow-up for Phone Call        Pt states she cannot swallow the Augmentin tablet. She states it is too large and she has trouble swallowing her smaller pills. Pt would like to switch medicines. I told pt she is allergic to Cipro so this is the alternative ATB. I told her to cut in half and take both halves with juice. Pt states that will not work b/c the tablets will be sharp and will hurt going down. I told her to contact her pharmacy to see if she could crush the tablet to put it in apple sauce to eat. She was very reluctant but states she will calll and ask. She states she will call back with any other concerns about this medication.  Follow-up by: Christie Nottingham CMA Duncan Dull),  May 14, 2009 9:15 AM

## 2010-05-04 NOTE — Procedures (Signed)
Summary: Colonoscopy   Colonoscopy  Procedure date:  06/29/2005  Findings:      Location:  [facility].    Patient Name: Rebecca [name], Rebecca [name] b. MRN: [id number] Procedure Procedures: Colonoscopy CPT: [phone].    with Hot Biopsy(s)CPT: [id number].  Personnel: Endoscopist: Barbette Hair. Arlyce Dice, MD.  Patient Consent: Procedure, Alternatives, Risks and Benefits discussed, consent obtained, from patient.  Indications Symptoms: Hematochezia.  History Comments: Patient history reviewed/updated, physical performed prior to initiation of sedation? Exam Exam: Extent of exam reached: Cecum, extent intended: Cecum.  The cecum was identified by IC valve. Colon retroflexion performed. ASA Classification: II. Tolerance: good.  Monitoring: Pulse and BP monitoring, Oximetry used. Supplemental O2 given. at 2 Liters.  Colon Prep Used Miralax for colon prep. Prep results: excellent.  Sedation Meds: Patient assessed and found to be appropriate for moderate (conscious) sedation. Sedation was managed by the Endoscopist. Fentanyl 100 mcg. given IV. Versed 12 given IV.  Findings - DIVERTICULOSIS: Descending Colon to Sigmoid Colon. ICD9: Diverticulosis: 562.10.  - DIVERTICULOSIS: Cecum to Ascending Colon. ICD9: Diverticulosis: 562. 10. Comments: Moderately severe diverticular changes in left and right colon.  POLYP: Sigmoid Colon, Maximum size: 4 mm. Distance from Anus 30 cm. Procedure:  hot biopsy, ICD9: Colon Polyps: 211.3.  HEMORRHOIDS: Internal. ICD9: Hemorrhoids, Internal: 455.0.   Assessment Abnormal examination, see findings above.  Diagnoses: 562.10: Diverticulosis.  211.3: Colon Polyps.  455.0: Hemorrhoids, Internal.   Events  Unplanned Interventions: No intervention was required.  Unplanned Events: There were no complications. Plans  Post Exam Instructions: Post sedation instructions given.  Medication Plan: Fiber supplements: Bran 1 Tbsp QD, starting Jun 29, 2005   Patient Education: Patient given standard instructions for: Polyps. Diverticulosis. Hemorrhoids.  Disposition: After procedure patient sent to recovery. After recovery patient sent home.  Scheduling/Referral: Colonoscopy, to Barbette Hair. Arlyce Dice, MD, If adenoma, around Jun 30, 2010.    cc: Stacie Glaze, MD     Beather Arbour Colllins, MD    This report was created from the original endoscopy report, which was reviewed and signed by the above listed endoscopist.

## 2010-05-04 NOTE — Assessment & Plan Note (Signed)
Summary: 3-4 weeks f/u//ca   Vital Signs:  Patient Profile:   77 Years Old Female Height:     64 inches Weight:      124 pounds Temp:     98.2 degrees F oral Pulse rate:   76 / minute Resp:     14 per minute BP sitting:   140 / 80  (left arm)  Vitals Entered By: Willy Eddy, LPN (July 20, 2007 1:53 PM)                 Chief Complaint:  ROA-reglan problems-causing stomach churning and diarrhea-c/o rt mid to upper back pain on rt side.  History of Present Illness: resistant ecoli to amoxil Increased pain in right flank nausia burnig renal pain  Hypertension History:      She denies headache, chest pain, palpitations, dyspnea with exertion, orthopnea, PND, peripheral edema, visual symptoms, neurologic problems, syncope, and side effects from treatment.        Positive major cardiovascular risk factors include female age 71 years old or older, hyperlipidemia, and hypertension.  Negative major cardiovascular risk factors include non-tobacco-user status.        Prior Medication List:  HYDROCHLOROTHIAZIDE 12.5 MG CAPS (HYDROCHLOROTHIAZIDE) once daily SYNTHROID 88 MCG TABS (LEVOTHYROXINE SODIUM) once daily [BMN] XANAX 0.25 MG TABS (ALPRAZOLAM) as needed KLOR-CON 10 10 MEQ  TBCR (POTASSIUM CHLORIDE) once daily CALCIUM 500/D 500-200 MG-UNIT  TABS (CALCIUM CARBONATE-VITAMIN D) 2 a day VYTORIN 10-20 MG  TABS (EZETIMIBE-SIMVASTATIN) once daily ZANTAC 300 MG  TABS (RANITIDINE HCL) one by mouth q HS NEXIUM 40 MG  CPDR (ESOMEPRAZOLE MAGNESIUM) one by mouth q AM REGLAN 5 MG  TABS (METOCLOPRAMIDE HCL) one by mouth QHS AMOXIL 500 MG  CAPS (AMOXICILLIN) one by mouth TID   Current Allergies (reviewed today): ! CODEINE ! CIPRO ! ULTRAM ! RANITIDINE HCL (RANITIDINE HCL)  Past Medical History:    Reviewed history from 10/18/2006 and no changes required:       Hypertension       Diverticulosis, colon       GERD  Past Surgical History:    Reviewed history from 11/13/2006  and no changes required:       Tonsillectomy       Appendectomy       knee surgery bilateral       umbilical hernia   Family History:    Reviewed history from 10/18/2006 and no changes required:       Fam hx Crohn's dz       Fam hx polyps       Family History Diabetes 1st degree relative       Family Hsitory Headaches       Family History Hypertension  Social History:    Reviewed history from 10/18/2006 and no changes required:       Never Smoked       Alcohol use-no       Regular exercise-yes    Review of Systems       fatigue, daily nausia   Physical Exam  General:     Well-developed,well-nourished,in no acute distress; alert,appropriate and cooperative throughout examination Head:     normocephalic.  atraumatic.   Eyes:     pupils equal and pupils round.  pupils reactive to light.   Ears:     R ear normal and L ear normal.   Nose:     no nasal discharge.  no external erythema.   Mouth:  pharynx pink and moist.  no posterior lymphoid hypertrophy.   Neck:     supple.  full ROM and no masses.   Lungs:     Normal respiratory effort, chest expands symmetrically. Lungs are clear to auscultation, no crackles or wheezes. Heart:     Normal rate and regular rhythm. S1 and S2 normal without gallop, murmur, click, rub or other extra sounds. Abdomen:     soft, epigastric tenderness, and RUQ tenderness.      Impression & Recommendations:  Problem # 1:  DIVERTICULOSIS, COLON (ICD-562.10)  Labs Reviewed: Hgb: 13.1 (06/28/2007)   Hct: 38.8 (06/28/2007)      Problem # 2:  GERD (ICD-530.81) reglan added   the nightime dose cause diarrhea Her updated medication list for this problem includes:    Zantac 300 Mg Tabs (Ranitidine hcl) ..... One by mouth q hs    Nexium 40 Mg Cpdr (Esomeprazole magnesium) ..... One by mouth q am  Diagnostics Reviewed:  Discussed lifestyle modifications, diet, antacids/medications, and preventive measures. Handout provided.   Problem  # 3:  UTI (ICD-599.0)  The following medications were removed from the medication list:    Amoxil 500 Mg Caps (Amoxicillin) ..... One by mouth tid  Her updated medication list for this problem includes:    Macrobid 100 Mg Caps (Nitrofurantoin monohyd macro) .[name] Kitchen... Take one (1) by mouth twice a day  Encouraged to push clear liquids, get enough rest, and take acetaminophen as needed. To be seen in 10 days if no improvement, sooner if worse.   Problem # 4:  ABDOMINAL PAIN, GENERALIZED (ICD-789.07) deep aching pain  lasting for months  weight lost Her updated medication list for this problem includes:    Reglan 5 Mg Tabs (Metoclopramide hcl) ..... One by mouth qhs Discussed symptom control with the patient. check cea and ca 125 Orders: TLB-CEA (Carcinoembryonic Antigen) (82378-CEA) T-CA 125 ([id number])   Complete Medication List: 1)  Hydrochlorothiazide 12.5 Mg Caps (Hydrochlorothiazide) .... Once daily 2)  Synthroid 88 Mcg Tabs (Levothyroxine sodium) .... Once daily 3)  Xanax 0.25 Mg Tabs (Alprazolam) .... As needed 4)  Klor-con 10 10 Meq Tbcr (Potassium chloride) .... Once daily 5)  Calcium 500/d 500-200 Mg-unit Tabs (Calcium carbonate-vitamin d) .... 2 a day 6)  Vytorin 10-20 Mg Tabs (Ezetimibe-simvastatin) .... Once daily 7)  Zantac 300 Mg Tabs (Ranitidine hcl) .... One by mouth q hs 8)  Nexium 40 Mg Cpdr (Esomeprazole magnesium) .... One by mouth q am 9)  Reglan 5 Mg Tabs (Metoclopramide hcl) .... One by mouth qhs 10)  Macrobid 100 Mg Caps (Nitrofurantoin monohyd macro) .... Take one (1) by mouth twice a day  Hypertension Assessment/Plan:      The patient's hypertensive risk group is category B: At least one risk factor (excluding diabetes) with no target organ damage.  Today's blood pressure is 140/80.  Her blood pressure goal is < 140/90.   Patient Instructions: 1)  one half of reglan before lunch and one half before supper 2)  stay on the nexxium/zantac 3)  treat the  urine infection with macrobid called in to your pharmacy 4)  state the probiotic next week on  a day    Prescriptions: MACROBID 100 MG CAPS (NITROFURANTOIN MONOHYD MACRO) Take one (1) by mouth twice a day  #20 x 0   Entered and Authorized by:   Stacie Glaze MD   Signed by:   Stacie Glaze MD on 07/20/2007   Method used:   Electronically  sent to ...       [facility]  [address] # [id number]*       [address]       [place], [place]  [address]       Ph: [phone] or [phone]       Fax: [phone]   RxID:   [id number]  ]

## 2010-05-04 NOTE — Assessment & Plan Note (Signed)
Summary: 2 month fup//ccm   Vital Signs:  Patient profile:   77 year old female Height:      64 inches Weight:      118 pounds BMI:     20.33 Temp:     98.2 degrees F oral Pulse rate:   68 / minute Resp:     14 per minute BP sitting:   110 / 70  (left arm) BP standing:   110 / 70  (left arm)  Vitals Entered By: Willy Eddy, LPN (March 09, 2010 9:59 AM) CC: roa, Hypertension Management Is Patient Diabetic? No   Primary Care Provider:  Darryll Capers, MD  CC:  roa and Hypertension Management.  History of Present Illness: The pt has scoliosis and spinal stenosis. Dr Danielle Dess did injections that did not help pain and has offered a myelogram but she has deferred. Her GYN monitered her vitamin d and had dropped from 55 to 33.2   Hypertension History:      She denies headache, chest pain, palpitations, dyspnea with exertion, orthopnea, PND, peripheral edema, visual symptoms, neurologic problems, syncope, and side effects from treatment.        Positive major cardiovascular risk factors include female age 67 years old or older, hyperlipidemia, and hypertension.  Negative major cardiovascular risk factors include non-tobacco-user status.        Positive history for target organ damage include peripheral vascular disease.  Further assessment for target organ damage reveals no history of ASHD or stroke/TIA.     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) 2)  Lumbar Radiculopathy, Atypical  (ICD-724.4) 3)  Unspecified Peripheral Vascular Disease  (ICD-443.9) 4)  Polyneuropathy Other Diseases Classified [facility]  (ICD-357.4) 5)  Preventive Health Care  (ICD-V70.0) 6)  Adenomatous Colonic Polyp  (ICD-211.3) 7)  Diverticulitis-colon  (ICD-562.11) 8)  Anemia, Iron Deficiency  (ICD-280.9) 9)  Dysuria  (ICD-788.1) 10)   Nonspecific Abn Finding Rad & Oth Exam Gi Tract  (ICD-793.4) 11)  Carotid Artery Disease  (ICD-433.10) 12)  Gastroparesis  (ICD-536.3) 13)  Allergic Rhinitis Cause Unspecified  (ICD-477.9) 14)  Screening Colorectal-cancer  (ICD-V76.51) 15)  Dysphagia  (ICD-787.29) 16)  Ganglion of Tendon Sheath  (ICD-727.42) 17)  Weight Loss, Abnormal  (ICD-783.21) 18)  Abdominal Pain, Generalized  (ICD-789.07) 19)  Ganglion of Joint  (ICD-727.41) 20)  Hypothyroidism, Primary  (ICD-244.9) 21)  Hyperlipidemia  (ICD-272.4) 22)  Advef, Drug/medicinal/biological Subst Nos  (ICD-995.20) 23)  Family History Diabetes 1st Degree Relative  (ICD-V18.0) 24)  Gerd  (ICD-530.81) 25)  Diverticulosis, Colon  (ICD-562.10) 26)  Hypertension  (ICD-401.9)  Current Medications (verified): 1)  Synthroid 88 Mcg Tabs (Levothyroxine Sodium) .... Once Daily 2)  Xanax 0.25 Mg Tabs (Alprazolam) .... As Needed 3)  Calcium 500/d 500-200 Mg-Unit  Tabs (Calcium Carbonate-Vitamin D) .... 2 A Day 4)  Vytorin 10-20 Mg  Tabs (Ezetimibe-Simvastatin) .... Once Daily 5)  Align   Caps (Misc Intestinal Flora Regulat) .... One By Mouth Daily 6)  Pantoprazole Sodium 40 Mg Tbec (Pantoprazole Sodium) .... One By Mouth Daily 7)  Vitamin D 1200 .[name] Kitchen.. 1 Once Daily 8)  Voltaren 1 % Gel (Diclofenac Sodium) .... Use As Directed 9)  Gabapentin 100 Mg Caps (Gabapentin) .[name] Kitchen.. 1 Once Daily 10)  Benicar 20 Mg Tabs (Olmesartan Medoxomil) .... One By Mouth Daily 11)  Zithromax Z-Pak  250 Mg Tabs (Azithromycin) .... As Directed  Allergies (verified): 1)  ! Codeine 2)  ! Cipro 3)  ! Ultram 4)  ! Pneumovax 23 (Pneumococcal Vac Polyvalent) 5)  ! * Levoquin 6)  ! Bactrim 7)  ! Adhesive Tape 1/2"x10yd (Adhesive Tape)  Past History:  Past Surgical History: Tonsillectomy Appendectomy knee surgery bilateral umbilical hernia repair Salivary Gland removed Lt side double pallital tori bone removed mortensons neuroma on right foot and platar facial release  201o Oral surgery 2010 Foot surgery 2010 scoliosis spinal injections ( facet) 2011 by [facility] ( failed)  Family History: Reviewed history from 06/10/2009 and no changes required. Fam hx Crohn's dz-niece Fam hx polyps-Brother, Sister, daughter Family History Diabetes 1st degree relative Family Hsitory Headaches Family History Hypertension Family History of Colon Cancer: ? MGM, and maternal aunt   Social History: Reviewed history from 06/10/2009 and no changes required. Retired  Never Smoked Alcohol use-no Regular exercise-yes  Review of Systems       The patient complains of vision loss.  The patient denies anorexia, fever, weight loss, weight gain, decreased hearing, hoarseness, chest pain, syncope, dyspnea on exertion, peripheral edema, prolonged cough, headaches, hemoptysis, abdominal pain, melena, hematochezia, severe indigestion/heartburn, hematuria, incontinence, genital sores, muscle weakness, suspicious skin lesions, transient blindness, difficulty walking, depression, unusual weight change, abnormal bleeding, enlarged lymph nodes, angioedema, and breast masses.         back pain  Physical Exam  General:  Well-developed,well-nourished,in no acute distress; alert,appropriate and cooperative throughout examination Head:  normocephalic and atraumatic.   Eyes:  pupils equal and pupils round.   Ears:  R ear normal and L ear normal.   Mouth:  No deformity or lesions, dentition normal. Neck:  No deformities, masses, or tenderness noted. Lungs:  normal respiratory effort and no wheezes.   Heart:  normal rate and regular rhythm.   Abdomen:  Bowel sounds positive,abdomen soft and non-tender without masses, organomegaly or hernias noted. Msk:  scoliosis and SI joint tenderness.   Pulses:  R and L carotid,radial,femoral,dorsalis pedis and posterior tibial pulses are full and equal bilaterally Extremities:  No clubbing, cyanosis, edema, or deformity noted with normal full range of  motion of all joints.   Neurologic:  alert & oriented X3 and DTRs symmetrical and normal.     Impression & Recommendations:  Problem # 1:  SPINAL STENOSIS, LUMBAR (ICD-724.02) seeing [facility], notes and MRI reviewed with pt  Problem # 2:  SCOLIOSIS, LUMBAR SPINE (ICD-737.30) as above  Problem # 3:  OTHER OSTEOPOROSIS (ICD-733.09) increased vit d to 2000 Discussed medication use, applications of heat or ice, and exercises.   Problem # 4:  HYPERTENSION (ICD-401.9) cut does in 1/2 had stopped it for a couple of days and  blood pressure is still low not orthostatic Her updated medication list for this problem includes:    Benicar 20 Mg Tabs (Olmesartan medoxomil) ..... One/ half by mouth daily  BP today: 110/70 Prior BP: 160/80 (01/05/2010)  Prior 10 Yr Risk Heart Disease: Not enough information (07/09/2007)  Labs Reviewed: K+: 3.6 (10/29/2009) Creat: : 0.8 (10/29/2009)   Chol: 201 (08/21/2009)   HDL: 53.00 (08/21/2009)   LDL: DEL (06/28/2007)   TG: 200.0 (08/21/2009)  Orders: TLB-BMP (Basic Metabolic Panel-BMET) (80048-METABOL)  Complete Medication List: 1)  Synthroid 88 Mcg Tabs (Levothyroxine sodium) .... Once daily 2)  Xanax 0.25 Mg Tabs (Alprazolam) .... As needed 3)  Calcium 500/d 500-200 Mg-unit Tabs (Calcium carbonate-vitamin d) .... 2 a day 4)  Vytorin  10-20 Mg Tabs (Ezetimibe-simvastatin) .... Once daily 5)  Align Caps (Misc intestinal flora regulat) .... One by mouth daily 6)  Lansoprazole 30 Mg Cpdr (Lansoprazole) .... One by mouth two times a day  (replaces the dexilant) 7)  Vitamin D3 2000 Unit Caps (Cholecalciferol) .... One by mouth daily 8)  Voltaren 1 % Gel (Diclofenac sodium) .... Use as directed 9)  Gabapentin 100 Mg Caps (Gabapentin) .[name] Kitchen.. 1 once daily 10)  Benicar 20 Mg Tabs (Olmesartan medoxomil) .... One/ half by mouth daily  Other Orders: Venipuncture (16109) TLB-B12 + Folate Pnl (60454_09811-B14/NWG) TLB-IBC Pnl (Iron/FE;Transferrin)  (83550-IBC) TLB-CBC Platelet - w/Differential (85025-CBCD) TLB-TSH (Thyroid Stimulating Hormone) (84443-TSH)  Hypertension Assessment/Plan:      The patient's hypertensive risk group is category C: Target organ damage and/or diabetes.  Today's blood pressure is 110/70.  Her blood pressure goal is < 140/90.  Patient Instructions: 1)  Please schedule a follow-up appointment in 3 months. 2)  cut the benicar in 1/2 Prescriptions: LANSOPRAZOLE 30 MG CPDR (LANSOPRAZOLE) one by mouth two times a day  (replaces the dexilant)  #60 x 11   Entered and Authorized by:   Stacie Glaze MD   Signed by:   Stacie Glaze MD on 03/09/2010   Method used:   Electronically to        [facility]  [address] # [id number]* (retail)       [address]       [place], [place]  [address]       Ph: [phone] or [phone]       Fax: [phone]   RxID:   [id number]    Orders Added: 1)  Venipuncture [75643] 2)  TLB-B12 + Folate Pnl [82746_82607-B12/FOL] 3)  TLB-IBC Pnl (Iron/FE;Transferrin) [83550-IBC] 4)  TLB-CBC Platelet - w/Differential [85025-CBCD] 5)  TLB-TSH (Thyroid Stimulating Hormone) [84443-TSH] 6)  TLB-BMP (Basic Metabolic Panel-BMET) [80048-METABOL] 7)  Est. Patient Level IV [32951]  Appended Document: Orders Update     Clinical Lists Changes  Orders: Added new Service order of Specimen Handling (88416) - Signed

## 2010-05-04 NOTE — Progress Notes (Signed)
Summary: CAT scan   Phone Note Call from Patient Call back at [phone]   Caller: Patient Call For: MD Reason for Call: Lab or Test Results Summary of Call: pt would like CAT scan results Initial call taken by: Vallarie Mare,  March 23, 2009 11:13 AM  Follow-up for Phone Call        Dr.Stark--This is your pt., but was seen by Gunnar Fusi. Can you please review CT scan and advise. Thank You. Follow-up by: Laureen Ochs LPN,  March 23, 2009 11:54 AM  Additional Follow-up for Phone Call Additional follow up Details #1::        Gunnar Fusi, since you saw this pt. please review the CT Thank you. RK was the supervising MD. Additional Follow-up by: Meryl Dare MD Clementeen Graham,  March 24, 2009 8:38 PM    Additional Follow-up for Phone Call Additional follow up Details #2::    Pam, please let patient know that no signs of colitis, diverticulitis or other on the CTscan. Needs to keep her appt. Is she doing better? Follow-up by: Willette Cluster NP,  March 25, 2009 2:06 PM  Additional Follow-up for Phone Call Additional follow up Details #3:: Details for Additional Follow-up Action Taken: Gave pt her CT results.  Pt is feeling some better.  Her stools are more formed than they were when she was here.  She will keep her appt with Dr. Russella Dar on 04-15-2009 at 2:30 PM.  Additional Follow-up by: Joselyn Glassman,  March 25, 2009 5:02 PM

## 2010-05-04 NOTE — Assessment & Plan Note (Signed)
Summary: uti   Vital Signs:  Patient Profile:   77 Years Old Female Weight:      194 pounds Temp:     98.5 degrees F oral Pulse rate:   76 / minute Resp:     12 per minute BP sitting:   140 / 70  (left arm) Cuff size:   regular  Vitals Entered By: Willy Eddy, LPN (November 13, 2006 2:37 PM)               Chief Complaint:  co lower abd pain/uncomfortable pre urination.  History of Present Illness: This is the second uti for this pleasant wf with risks of menopause. Symptoms are limited to frequncy and dysuria No back pain. No fever, hematuria or nausia.  Acute Visit History:      The patient complains of genitourinary symptoms.  Pertinent previous surgeries include appendectomy.         Current Allergies: ! CODEINE ! CIPRO ! ULTRAM ! RANITIDINE HCL (RANITIDINE HCL)  Past Medical History:    Reviewed history from 10/18/2006 and no changes required:       Hypertension       Diverticulosis, colon       GERD  Past Surgical History:    Reviewed history and no changes required:       Tonsillectomy       Appendectomy       knee surgery bilateral       umbilical hernia     Review of Systems       The patient complains of decreased hearing.  The patient denies anorexia, fever, chest pain, dyspnea on exhertion, peripheral edema, prolonged cough, abdominal pain, and severe indigestion/heartburn.     Physical Exam  General:     Well-developed,well-nourished,in no acute distress; alert,appropriate and cooperative throughout examination Eyes:     No corneal or conjunctival inflammation noted. EOMI. Perrla. Funduscopic exam benign, without hemorrhages, exudates or papilledema. Vision grossly normal. Ears:     ws and erythema to cannal on left ear with hx of chronic otitis Nose:     no nasal discharge.   Neck:     No deformities, masses, or tenderness noted. Lungs:     Normal respiratory effort, chest expands symmetrically. Lungs are clear to auscultation,  no crackles or wheezes. Heart:     Normal rate and regular rhythm. S1 and S2 normal without gallop, murmur, click, rub or other extra sounds. Abdomen:     soft, non-tender, and normal bowel sounds.      Impression & Recommendations:  Problem # 1:  UTI (ICD-599.0)  Orders: UA Dipstick w/o Micro ([address])  Her updated medication list for this problem includes:    Amoxicillin 500 Mg Tabs (Amoxicillin) .[name] Kitchen... Take one (1) by mouth three times a day for 10 days  Encouraged to push clear liquids, get enough rest, and take acetaminophen as needed. To be seen in 10 days if no improvement, sooner if worse.   Problem # 2:  OTITIS EXTERNA, ACUTE NEC (ICD-380.22) antibiotic as above, wax impaction seen  Problem # 3:  HYPERLIPIDEMIA (ICD-272.4)  Her updated medication list for this problem includes:    Vytorin 10-20 Mg Tabs (Ezetimibe-simvastatin) ..... Once daily  Labs Reviewed: Chol: 234 (06/28/2006)   HDL: 49.7 (06/28/2006)   LDL: DEL (06/28/2006)   TG: 210 (06/28/2006) SGOT: 21 (06/15/2006)   SGPT: 15 (06/15/2006)   Problem # 4:  GERD (ICD-530.81)  Her updated medication list for this problem includes:  Prilosec 20 Mg Cpdr (Omeprazole) ..... Once daily    Nexium 40 Mg Cpdr (Esomeprazole magnesium) ..... Once daily   Complete Medication List: 1)  Hydrochlorothiazide 12.5 Mg Caps (Hydrochlorothiazide) 2)  Synthroid 88 Mcg Tabs (Levothyroxine sodium) 3)  Xanax 0.25 Mg Tabs (Alprazolam) 4)  Prilosec 20 Mg Cpdr (Omeprazole) .... Once daily 5)  Nexium 40 Mg Cpdr (Esomeprazole magnesium) .... Once daily 6)  Klor-con 10 10 Meq Tbcr (Potassium chloride) .... Once daily 7)  Calcium 500/d 500-200 Mg-unit Tabs (Calcium carbonate-vitamin d) .... 2 a day 8)  Vytorin 10-20 Mg Tabs (Ezetimibe-simvastatin) .... Once daily 9)  Hydrochlorothiazide 12.5 Mg Caps (Hydrochlorothiazide) .... Once daily 10)  Amoxicillin 500 Mg Tabs (Amoxicillin) .... Take one (1) by mouth three times a day for 10  days   Patient Instructions: 1)  keep appointment    Prescriptions: AMOXICILLIN 500 MG TABS (AMOXICILLIN) Take one (1) by mouth three times a day for 10 days  #30 x 0   Entered and Authorized by:   Stacie Glaze MD   Signed by:   Stacie Glaze MD on 11/13/2006   Method used:   Electronically sent to ...       [facility] [id number]       [address]       [place], [place]  [address]       Ph: [phone] or [phone]       Fax: [phone]   RxID:   [id number]      Laboratory Results   Urine Tests   Date/Time Reported: November 13, 2006 2:33 PM   Routine Urinalysis   Color: yellow Appearance: Clear Glucose: negative   (Normal Range: Negative) Bilirubin: negative   (Normal Range: Negative) Ketone: negative   (Normal Range: Negative) Spec. Gravity: 1.010   (Normal Range: 1.003-1.035) Blood: negative   (Normal Range: Negative) pH: 6.0   (Normal Range: 5.0-8.0) Protein: negative   (Normal Range: Negative) Urobilinogen: 0.2   (Normal Range: 0-1) Nitrite: negative   (Normal Range: Negative) Leukocyte Esterace: 1+   (Normal Range: Negative)    Comments: ..................................................................[name] KitchenWynona Canes, CMA  November 13, 2006 2:33 PM      Appended Document: uti weight put in error. correct weigh tis 119 pounds

## 2010-05-04 NOTE — Progress Notes (Signed)
Summary: GES results   Phone Note Call from Patient Call back at [phone]   Call For: DR [facility] Reason for Call: Talk to Nurse Summary of Call: Rebecca [name] called yesterday to give her results. Results were left on her machine but she did not get them and now its erased. Can someone please give her results again. Initial call taken by: Leanor Kail [facility]ley [facility],  April 09, 2008 10:16 AM  Follow-up for Phone Call        GES results reviewed with patient. She will keep her Endoscopy appointment. Pt. instructed to call back as needed.  Follow-up by: Laureen Ochs LPN,  April 09, 2008 10:28 AM

## 2010-05-04 NOTE — Progress Notes (Signed)
Summary: Sooner appt.   Phone Note Call from Patient Call back at cell [phone]   Caller: Patient Call For: Dr. Russella Dar Reason for Call: Talk to Nurse Summary of Call: Pt. has appt. scheduled on 1.3.11 and is having alot of abdominal pain. Pt wants to be worked in sooner Initial call taken by: Karna Christmas,  March 12, 2009 10:49 AM  Follow-up for Phone Call        Kendal Hymen from Dr Ernie Avena office also called about working the patient into Dr Ardell Isaacs schedule.  Patient  will come in and see Willette Cluster RNP on 03-16-09, Dr Russella Dar is the hospital MD next week and therefore not in the office.  Kendal Hymen will notify the patient of the appointment date and time.  The appointment with Dr Russella Dar for 04-06-09 will be canceled. Follow-up by: Darcey Nora RN, CGRN,  March 12, 2009 11:23 AM

## 2010-05-04 NOTE — Assessment & Plan Note (Signed)
Summary: 1 mo rov/mm pt rsc due to opthamologist ov/njr   Vital Signs:  Patient profile:   77 year old female Height:      64 inches Weight:      120 pounds BMI:     20.67 Temp:     98.1 degrees F oral Pulse rate:   57 / minute BP sitting:   140 / 70 Cuff size:   regular  Vitals Entered By: Kathrynn Speed CMA (October 29, 2009 11:21 AM) CC: One mth rov to go over tests,   CC:  One mth rov to go over tests and .  Preventive Screening-Counseling & Management  Alcohol-Tobacco     Smoking Status: never  Current Medications (verified): 1)  Synthroid 88 Mcg Tabs (Levothyroxine Sodium) .... Once Daily 2)  Xanax 0.25 Mg Tabs (Alprazolam) .... As Needed 3)  Calcium 500/d 500-200 Mg-Unit  Tabs (Calcium Carbonate-Vitamin D) .... 2 A Day 4)  Vytorin 10-20 Mg  Tabs (Ezetimibe-Simvastatin) .... Once Daily 5)  Align   Caps (Misc Intestinal Flora Regulat) .... One By Mouth Daily 6)  Dexilant 60 Mg Cpdr (Dexlansoprazole) .[name] Kitchen.. 1 Once Daily 7)  Vitamin D 1200 .[name] Kitchen.. 1 Once Daily 8)  Voltaren 1 % Gel (Diclofenac Sodium) .... Use As Directed 9)  Gabapentin 100 Mg Caps (Gabapentin) .[name] Kitchen.. 1 Once Daily  Allergies (verified): 1)  ! Codeine 2)  ! Cipro 3)  ! Ultram 4)  ! Pneumovax 23 (Pneumococcal Vac Polyvalent) 5)  ! * Levoquin 6)  ! Bactrim 7)  ! Adhesive Tape 1/2"x10yd (Adhesive Tape)  Past History:  Family History: Last updated: 06/10/2009 Fam hx Crohn's dz-niece Fam hx polyps-Brother, Sister, daughter Family History Diabetes 1st degree relative Family Hsitory Headaches Family History Hypertension Family History of Colon Cancer: ? MGM, and maternal aunt   Social History: Last updated: 06/10/2009 Retired  Never Smoked Alcohol use-no Regular exercise-yes  Risk Factors: Exercise: yes (10/18/2006)  Risk Factors: Smoking Status: never (10/29/2009)  Past medical, surgical, family and social histories (including risk factors) reviewed, and no changes noted (except as noted  below).  Past Medical History: Reviewed history from 06/10/2009 and no changes required. Hypertension Diverticulosis, diverticulitis Esophageal Stricture Colon Polyps- Hyperplastic 06/2005 Anxiety Disorder Hemorrhoids Gastroparesis-49.6% solid retention at 2 hrs.  Anemia, fe deficiency  Past Surgical History: Reviewed history from 03/16/2009 and no changes required. 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 Fam hx Crohn's dz-niece Fam hx polyps-Brother, Sister, daughter Family History Diabetes 1st degree relative Family Hsitory Headaches Family History Hypertension Family History of Colon Cancer: ? MGM, and maternal aunt   Social History: Reviewed history from 06/10/2009 and no changes required. Retired  Never Smoked Alcohol use-no Regular exercise-yes  Review of Systems  The patient denies anorexia, fever, weight loss, weight gain, vision loss, decreased hearing, hoarseness, chest pain, syncope, dyspnea on exertion, peripheral edema, prolonged cough, headaches, hemoptysis, abdominal pain, melena, hematochezia, severe indigestion/heartburn, hematuria, incontinence, genital sores, muscle weakness, suspicious skin lesions, transient blindness, difficulty walking, depression, unusual weight change, abnormal bleeding, enlarged lymph nodes, angioedema, and breast masses.    Physical Exam  General:  Well-developed,well-nourished,in no acute distress; alert,appropriate and cooperative throughout examination Head:  normocephalic and atraumatic.   Eyes:  pupils equal and pupils round.   Ears:  R ear  normal and L ear normal.   Mouth:  No deformity or lesions, dentition normal. Neck:  No deformities, masses,  or tenderness noted. Lungs:  normal respiratory effort and no wheezes.   Heart:  normal rate and regular rhythm.   Abdomen:  Bowel sounds positive,abdomen soft and non-tender without masses, organomegaly or hernias noted. Msk:  no joint swelling and no joint warmth.   Extremities:  No clubbing, cyanosis, edema, or deformity noted with normal full range of motion of all joints.   Neurologic:  cranial nerves II-XII intact and DTRs symmetrical and normal.     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 Once daily  Labs Reviewed: TSH: 0.65 (08/21/2009)   Total T4: 9.0 (12/24/2007)    HgBA1c: 5.6 (07/26/2006) Chol: 201 (08/21/2009)   HDL: 53.00 (08/21/2009)   LDL: DEL (06/28/2007)   TG: 200.0 (08/21/2009)  Problem # 3:  LUMBAR RADICULOPATHY, ATYPICAL (ICD-724.4)  Discussed use of moist heat or ice, modified activities, medications, and stretching/strengthening exercises. Back care instructions given. To be seen in 2 weeks if no improvement; sooner if worsening of symptoms.   Orders: Radiology Referral (Radiology)  Problem # 4:  ANEMIA, IRON DEFICIENCY (ICD-280.9) due monitering Hgb: 12.5 (08/21/2009)   Hct: 36.8 (08/21/2009)   Platelets: 320.0 (08/21/2009) RBC: 3.85 (08/21/2009)   RDW: 14.1 (08/21/2009)   WBC: 8.2 (08/21/2009) MCV: 95.5 (08/21/2009)   MCHC: 34.0 (08/21/2009) Ferritin: 26.0 (03/16/2009) Iron: 79 (08/21/2009)   % Sat: 22.9 (08/21/2009) B12: 787 (03/16/2009)   Folate: >20.0 ng/mL (03/16/2009)   TSH: 0.65 (08/21/2009)  Problem # 5:  MIGRAINE, OPHTHALMIC (ICD-346.80)  opthalmic migraine  Headache diary reviewed.  Complete Medication List: 1)  Synthroid 88 Mcg Tabs  (Levothyroxine sodium) .... Once daily 2)  Xanax 0.25 Mg Tabs (Alprazolam) .... As needed 3)  Calcium 500/d 500-200 Mg-unit Tabs (Calcium carbonate-vitamin d) .... 2 a day 4)  Vytorin 10-20 Mg Tabs (Ezetimibe-simvastatin) .... Once daily 5)  Align Caps (Misc intestinal flora regulat) .... One by mouth daily 6)  Dexilant 60 Mg Cpdr (Dexlansoprazole) .[name] Kitchen.. 1 once daily 7)  Vitamin D 1200  .[name] Kitchen.. 1 once daily 8)  Voltaren 1 % Gel (Diclofenac sodium) .... Use as directed 9)  Gabapentin 100 Mg Caps (Gabapentin) .[name] Kitchen.. 1 once daily  Other Orders: Venipuncture ([address]) TLB-CBC Platelet - w/Differential (85025-CBCD) TLB-BMP (Basic Metabolic Panel-BMET) (80048-METABOL)  Patient Instructions: 1)  Please schedule a follow-up appointment in 2 months.

## 2010-05-04 NOTE — Progress Notes (Signed)
Summary: wants bonnye to call  Phone Note Call from Patient Call back at Home Phone [phone]   Call For: [facility] Reason for Call: Talk to Nurse, Insurance Question Summary of Call: Wants Bonnye to call & did not want to give me info, because it's complicated & she doesn't want to go on & on. Initial call taken by: Rudy Jew, RN,  Aug 04, 2009 12:23 PM  Follow-up for Phone Call        knee surgery and became impacted - had bm now hemorrhoids are painful--will take 2 stool sfteners until more active and may have analpram cream per dr Lovell Sheehan Follow-up by: Willy Eddy, LPN,  Aug 04, 9145 2:47 PM    New/Updated Medications: ANALPRAM-HC 1-2.5 % CREA (HYDROCORTISONE ACE-PRAMOXINE) apply to hemorrhoids two times a day Prescriptions: ANALPRAM-HC 1-2.5 % CREA (HYDROCORTISONE ACE-PRAMOXINE) apply to hemorrhoids two times a day  #1 container x 1   Entered by:   Willy Eddy, LPN   Authorized by:   Stacie Glaze MD   Signed by:   Willy Eddy, LPN on 82/95/6213   Method used:   Electronically to        [facility]  [id number] (retail)       [address]       [place], [place]  [address]       Ph: [phone] or [phone]       Fax: [phone]   RxID:   [id number]

## 2010-05-04 NOTE — Consult Note (Signed)
Summary: [facility]   Imported By: Maryln Gottron 06/12/2007 13:54:34  _____________________________________________________________________  External Attachment:    Type:   Image     Comment:   External Document

## 2010-05-04 NOTE — Letter (Signed)
Summary: Patient Notice- Polyp Results  [facility]  [address] [place], [place] [address]   Phone: [phone]  Fax: [phone]        May 14, 2009 MRN: [id number]    Rebecca [name] [address] [place], [place]  [address]    Dear Ms. Morikawa,  I am pleased to inform you that the colon polyp(s) removed during your recent colonoscopy was (were) found to be benign (no cancer detected) upon pathologic examination. The other colon biopsies showed benign ulcer tissue.  I recommend you have a repeat colonoscopy examination in 5 years to look for recurrent polyps, as having colon polyps increases your risk for having recurrent polyps or even colon cancer in the future.  Should you develop new or worsening symptoms of abdominal pain, bowel habit changes or bleeding from the rectum or bowels, please schedule an evaluation with either your primary care physician or with me.  Please keep your follow-up visit as already scheduled.  Continue treatment plan as outlined the day of your exam.  Please call us if you are having persistent problems or have questions about your condition that have not been fully answered at this time.  Sincerely,  Meryl Dare MD [facility]  This letter has been electronically signed by your physician.  Appended Document: Patient Notice- Polyp Results letter mailed 2.11.11

## 2010-05-04 NOTE — Progress Notes (Signed)
Summary: Wants to switch MD's   Phone Note From Other Clinic Call back at [phone]   Caller: [facility] Call For: [facility] Summary of Call: PT WANTS TO SWITCH FROM DR Arlyce Dice TO DR Russella Dar B/C THAT IS WHO DR Lovell Sheehan RECCOMENDED Initial call taken by: Tawni Levy,  February 14, 2008 10:51 AM  Follow-up for Phone Call        DR.Jahsir Rama--Do you approve the switch? Follow-up by: Laureen Ochs LPN,  February 14, 2008 11:19 AM  Additional Follow-up for Phone Call Additional follow up Details #1::        ok Additional Follow-up by: Louis Meckel MD,  February 14, 2008 12:30 PM      Appended Document: Wants to switch MD's DR.STARK--Will you accept this patient?  Appended Document: Wants to switch MD's Yes.

## 2010-05-04 NOTE — Assessment & Plan Note (Signed)
Summary: emp-will fast add iron 290.8/pt rsc from bmp/cjr   Vital Signs:  Patient profile:   77 year old female Height:      64 inches Weight:      119 pounds BMI:     20.50 Temp:     98.2 degrees F oral Pulse rate:   68 / minute Resp:     12 per minute BP sitting:   120 / 80  (left arm)  Vitals Entered By: Willy Eddy, LPN (Aug 21, 2009 9:48 AM) CC: annual visit for disease managementfasting this am  Does patient need assistance? Functional Status Self care, Cook/clean, Shopping, Social activities Ambulation Normal  Vision Screening:Left eye with correction: 20 / 20 Right eye with correction: 20 / 20 Both eyes with correction: 20 / 20  Color vision testing: normal     Vision Comments: seeing [facility] 40db HL: Left  Right  Audiometry Comment: seeing Dr Lovey Newcomer  and has hearing aids in [place] that function appropriately    Prevention & Chronic Care Immunizations   Influenza vaccine: Fluvax 3+  (01/15/2009)   Influenza vaccine due: 12/04/2010    Tetanus booster: 07/28/2008: Td   Tetanus booster due: 07/29/2018    Pneumococcal vaccine: given  (03/05/1999)    H. zoster vaccine: 10/24/2007: Zostavax    Immunization comments: the pt had a pnemovax at age 12  Colorectal Screening   Hemoccult: Not documented   Hemoccult action/deferral: Not indicated  (08/21/2009)    Colonoscopy: DONE  (05/12/2009)   Colonoscopy due: 05/2014  Other Screening   Pap smear: normal  (07/03/2008)   Pap smear due: 07/03/2009    Mammogram: normal  (06/01/2009)   Mammogram due: 06/01/2010    DXA bone density scan: abnormal  (10/03/2007)   DXA scan due: 10/2009    Smoking status: never  (08/21/2009)  Lipids   Total Cholesterol: 209  (07/28/2008)   Lipid panel action/deferral: Lipid Panel ordered   LDL: DEL  (06/28/2007)   LDL Direct: 124.0  (07/28/2008)   HDL: 49.90  (07/28/2008)   Triglycerides: 155.0  (07/28/2008)    SGOT (AST): 18  (03/03/2009)   BMP  action: Ordered   SGPT (ALT): 16  (03/03/2009)   Alkaline phosphatase: 92  (03/03/2009)   Total bilirubin: 0.6  (03/03/2009)    Lipid flowsheet reviewed?: Yes   Progress toward LDL goal: At goal  Hypertension   Last Blood Pressure: 120 / 80  (08/21/2009)   Serum creatinine: 0.8  (03/03/2009)   BMP action: Ordered   Serum potassium 3.8  (03/03/2009)   Basic metabolic panel due: 02/21/2010    Hypertension flowsheet reviewed?: Yes   Progress toward BP goal: At goal  Self-Management Support :    Patient will work on the following items until the next clinic visit to reach self-care goals:     Medications and monitoring: take my medicines every day  (08/21/2009)     Eating: eat foods that are low in salt  (08/21/2009)     Activity: take a 30 minute walk every day  (08/21/2009)    Hypertension self-management support: BP self-monitoring log  (08/21/2009)    Lipid self-management support: Written self-care plan  (08/21/2009)   Lipid self-care plan printed.   Primary Care Provider:  Darryll Capers, MD  CC:  annual visit for disease managementfasting this am.  History of Present Illness: Here for Medicare AWV:  1.   Risk factors based on Past M, S, F history:    see forms  in the record 2.   Physical Activities:  knee problems have interfered with walking 3.   Depression/mood:  feels blood mood will assess 4.   Hearing: has hearing aid, aud[facility] 5.   ADL's:  normla functional status 6.   Fall Risk: no fall risk 7.   Home Safety:  no increased risk in the home 8.   Height, weight, &visual acuity: see record 9.   Counseling:  depresson 10.   Labs ordered based on risk factors: see orders 11.           Referral Coordination   had recent colon and endo with Russella Dar seeing [facility] for audiology, and Dr whitfiled orthopedist 12.           Care Plan revied with pt 13.            Cognitive Assessment   no memory issues  The pt has chromic probles that  are also folowed at this time hypothyriodism with som presistnat since of generalized fatigue Hyperlipidemia on Vytorin, and GERD on dexilant and seeing GI fro monitering of colon and espophagus IBS He has mild HTN on HTCZ 12.5  Preventive Screening-Counseling & Management  Alcohol-Tobacco     Smoking Status: never  Problems Prior to Update: 1)  Adenomatous Colonic Polyp  (ICD-211.3) 2)  Diverticulitis-colon  (ICD-562.11) 3)  Anemia, Iron Deficiency  (ICD-280.9) 4)  Dysuria  (ICD-788.1) 5)  Nonspecific Abn Finding Rad & Oth Exam Gi Tract  (ICD-793.4) 6)  Diarrhea-presumed Infectious  (ICD-009.3) 7)  Diarrhea  (ICD-787.91) 8)  Anemia  (ICD-285.9) 9)  Carotid Artery Disease  (ICD-433.10) 10)  Gastroparesis  (ICD-536.3) 11)  Allergic Rhinitis Cause Unspecified  (ICD-477.9) 12)  Screening Colorectal-cancer  (ICD-V76.51) 13)  Dysphagia  (ICD-787.29) 14)  Ganglion of Tendon Sheath  (ICD-727.42) 15)  Weight Loss, Abnormal  (ICD-783.21) 16)  Abdominal Pain, Generalized  (ICD-789.07) 17)  Ganglion of Joint  (ICD-727.41) 18)  Chest Pain, Atypical  (ICD-786.59) 19)  Candidiasis, Oral  (ICD-112.0) 20)  Hypothyroidism, Primary  (ICD-244.9) 21)  Hyperlipidemia  (ICD-272.4) 22)  Advef, Drug/medicinal/biological Subst Nos  (ICD-995.20) 23)  Family History Diabetes 1st Degree Relative  (ICD-V18.0) 24)  Gerd  (ICD-530.81) 25)  Diverticulosis, Colon  (ICD-562.10) 26)  Hypertension  (ICD-401.9)  Current Problems (verified): 1)  Adenomatous Colonic Polyp  (ICD-211.3) 2)  Diverticulitis-colon  (ICD-562.11) 3)  Anemia, Iron Deficiency  (ICD-280.9) 4)  Dysuria  (ICD-788.1) 5)  Nonspecific Abn Finding Rad & Oth Exam Gi Tract  (ICD-793.4) 6)  Diarrhea-presumed Infectious  (ICD-009.3) 7)  Diarrhea  (ICD-787.91) 8)  Anemia  (ICD-285.9) 9)  Carotid Artery Disease  (ICD-433.10) 10)  Gastroparesis  (ICD-536.3) 11)  Allergic Rhinitis Cause Unspecified  (ICD-477.9) 12)  Screening Colorectal-cancer   (ICD-V76.51) 13)  Dysphagia  (ICD-787.29) 14)  Ganglion of Tendon Sheath  (ICD-727.42) 15)  Weight Loss, Abnormal  (ICD-783.21) 16)  Abdominal Pain, Generalized  (ICD-789.07) 17)  Ganglion of Joint  (ICD-727.41) 18)  Chest Pain, Atypical  (ICD-786.59) 19)  Candidiasis, Oral  (ICD-112.0) 20)  Hypothyroidism, Primary  (ICD-244.9) 21)  Hyperlipidemia  (ICD-272.4) 22)  Advef, Drug/medicinal/biological Subst Nos  (ICD-995.20) 23)  Family History Diabetes 1st Degree Relative  (ICD-V18.0) 24)  Gerd  (ICD-530.81) 25)  Diverticulosis, Colon  (ICD-562.10) 26)  Hypertension  (ICD-401.9)  Medications Prior to Update: 1)  Hydrochlorothiazide 12.5 Mg Caps (Hydrochlorothiazide) .... Once Daily 2)  Synthroid 88 Mcg Tabs (Levothyroxine Sodium) .... Once Daily 3)  Xanax  0.25 Mg Tabs (Alprazolam) .... As Needed 4)  Calcium 500/d 500-200 Mg-Unit  Tabs (Calcium Carbonate-Vitamin D) .... 2 A Day 5)  Vytorin 10-20 Mg  Tabs (Ezetimibe-Simvastatin) .... Once Daily 6)  Align   Caps (Misc Intestinal Flora Regulat) .... One By Mouth Daily 7)  Dexilant 60 Mg Cpdr (Dexlansoprazole) .[name] Kitchen.. 1 Once Daily 8)  Klor-Con 8 Meq Cr-Tabs (Potassium Chloride) .[name] Kitchen.. 1 Once Daily 9)  Vitamin D 1200 .[name] Kitchen.. 1 Once Daily 10)  Macrodantin 50 Mg Caps (Nitrofurantoin Macrocrystal) .... Hold 11)  Niferex-Pn Forte  Tabs (Prenatal Vit-Fe Psac Cmplx-Fa) .... One By Mouth Daily ( May Sub For Generic) 12)  Voltaren 1 % Gel (Diclofenac Sodium) .... Use As Directed 13)  Analpram-Hc 1-2.5 % Crea (Hydrocortisone Ace-Pramoxine) .... Apply To Hemorrhoids Two Times A Day  Current Medications (verified): 1)  Hydrochlorothiazide 12.5 Mg Caps (Hydrochlorothiazide) .... Once Daily 2)  Synthroid 88 Mcg Tabs (Levothyroxine Sodium) .... Once Daily 3)  Xanax 0.25 Mg Tabs (Alprazolam) .... As Needed 4)  Calcium 500/d 500-200 Mg-Unit  Tabs (Calcium Carbonate-Vitamin D) .... 2 A Day 5)  Vytorin 10-20 Mg  Tabs (Ezetimibe-Simvastatin) .... Once Daily 6)   Align   Caps (Misc Intestinal Flora Regulat) .... One By Mouth Daily 7)  Dexilant 60 Mg Cpdr (Dexlansoprazole) .[name] Kitchen.. 1 Once Daily 8)  Klor-Con 8 Meq Cr-Tabs (Potassium Chloride) .[name] Kitchen.. 1 Once Daily 9)  Vitamin D 1200 .[name] Kitchen.. 1 Once Daily 10)  Niferex-Pn Forte  Tabs (Prenatal Vit-Fe Psac Cmplx-Fa) .... One By Mouth Daily ( May Sub For Generic) 11)  Voltaren 1 % Gel (Diclofenac Sodium) .... Use As Directed 12)  Analpram-Hc 1-2.5 % Crea (Hydrocortisone Ace-Pramoxine) .... Apply To Hemorrhoids Two Times A Day  Allergies (verified): 1)  ! Codeine 2)  ! Cipro 3)  ! Ultram 4)  ! Pneumovax 23 (Pneumococcal Vac Polyvalent) 5)  ! * Levoquin 6)  ! Bactrim  Past History:  Family History: Last updated: 06/10/2009 Fam hx Crohn's dz-niece Fam hx polyps-Brother, Sister, daughter Family History Diabetes 1st degree relative Family Hsitory Headaches Family History Hypertension Family History of Colon Cancer: ? MGM, and maternal aunt   Social History: Last updated: 06/10/2009 Retired  Never Smoked Alcohol use-no Regular exercise-yes  Risk Factors: Exercise: yes (10/18/2006)  Risk Factors: Smoking Status: never (08/21/2009)  Past medical, surgical, family and social histories (including risk factors) reviewed, and no changes noted (except as noted below).  Past Medical History: Reviewed history from 06/10/2009 and no changes required. Hypertension Diverticulosis, diverticulitis Esophageal Stricture Colon Polyps- Hyperplastic 06/2005 Anxiety Disorder Hemorrhoids Gastroparesis-49.6% solid retention at 2 hrs.  Anemia, fe deficiency  Past Surgical History: Reviewed history from 03/16/2009 and no changes required. Tonsillectomy Appendectomy knee surgery bilateral umbilical hernia repair Salivary Gland removed Lt side double pallital tori bone removed mortensons neuroma on right foot and platar facial release 201o Oral surgery 2010 Foot surgery 2010  Family History: Reviewed history  from 06/10/2009 and no changes required. Fam hx Crohn's dz-niece Fam hx polyps-Brother, Sister, daughter Family History Diabetes 1st degree relative Family Hsitory Headaches Family History Hypertension Family History of Colon Cancer: ? MGM, and maternal aunt   Social History: Reviewed history from 06/10/2009 and no changes required. Retired  Never Smoked Alcohol use-no Regular exercise-yes  Review of Systems  The patient denies anorexia, fever, weight loss, weight gain, vision loss, decreased hearing, hoarseness, chest pain, syncope, dyspnea on exertion, peripheral edema, prolonged cough, headaches, hemoptysis, abdominal pain, melena, severe indigestion/heartburn, hematuria, incontinence, genital sores, muscle weakness,  suspicious skin lesions, transient blindness, difficulty walking, depression, unusual weight change, abnormal bleeding, enlarged lymph nodes, angioedema, and breast masses.    Physical Exam  General:  Well-developed,well-nourished,in no acute distress; alert,appropriate and cooperative throughout examination Head:  normocephalic and atraumatic.   Eyes:  pupils equal and pupils round.   Ears:  R ear normal and L ear normal.   Mouth:  No deformity or lesions, dentition normal. Neck:  No deformities, masses, or tenderness noted. Lungs:  normal respiratory effort and no wheezes.   Heart:  normal rate and regular rhythm.   Abdomen:  Bowel sounds positive,abdomen soft and non-tender without masses, organomegaly or hernias noted. Neurologic:  cranial nerves II-XII intact and DTRs symmetrical and normal.   Skin:  vesicular rash Psych:  Cognition and judgment appear intact. Alert and cooperative with normal attention span and concentration. No apparent delusions, illusions, hallucinations   Impression & Recommendations:  Problem # 1:  ANEMIA, IRON DEFICIENCY (ICD-280.9)  Hgb: 12.2 (05/06/2009)   Hct: 37.3 (05/06/2009)   Platelets: 252.0 (05/06/2009) RBC: 3.87  (05/06/2009)   RDW: 12.4 (05/06/2009)   WBC: 8.4 (05/06/2009) MCV: 96.5 (05/06/2009)   MCHC: 32.6 (05/06/2009) Ferritin: 26.0 (03/16/2009) Iron: 44 (03/16/2009)   % Sat: 13.9 (03/16/2009) B12: 787 (03/16/2009)   Folate: >20.0 ng/mL (03/16/2009)   TSH: 1.45 (03/05/2009)  Problem # 2:  HYPOTHYROIDISM, PRIMARY (ICD-244.9)  Her updated medication list for this problem includes:    Synthroid 88 Mcg Tabs (Levothyroxine sodium) ..... Once daily  Labs Reviewed: TSH: 1.45 (03/05/2009)   Total T4: 9.0 (12/24/2007)    HgBA1c: 5.6 (07/26/2006) Chol: 209 (07/28/2008)   HDL: 49.90 (07/28/2008)   LDL: DEL (06/28/2007)   TG: 155.0 (07/28/2008)  Orders: TLB-TSH (Thyroid Stimulating Hormone) (84443-TSH) Venipuncture (95621)  Problem # 3:  HYPOTHYROIDISM, PRIMARY (ICD-244.9)  Her updated medication list for this problem includes:    Synthroid 88 Mcg Tabs (Levothyroxine sodium) ..... Once daily  Orders: TLB-TSH (Thyroid Stimulating Hormone) (84443-TSH) Venipuncture [phone])  Labs Reviewed: TSH: 1.45 (03/05/2009)   Total T4: 9.0 (12/24/2007)    HgBA1c: 5.6 (07/26/2006) Chol: 209 (07/28/2008)   HDL: 49.90 (07/28/2008)   LDL: DEL (06/28/2007)   TG: 155.0 (07/28/2008)  Problem # 4:  HYPERTENSION (ICD-401.9) Assessment: Unchanged  stop the HCTZ The following medications were removed from the medication list:    Hydrochlorothiazide 12.5 Mg Caps (Hydrochlorothiazide) ..... Once daily  Orders: TLB-BMP (Basic Metabolic Panel-BMET) (80048-METABOL)  BP today: 120/80 Prior BP: 112/72 (06/10/2009)  Prior 10 Yr Risk Heart Disease: Not enough information (07/09/2007)  Labs Reviewed: K+: 3.8 (03/03/2009) Creat: : 0.8 (03/03/2009)   Chol: 209 (07/28/2008)   HDL: 49.90 (07/28/2008)   LDL: DEL (06/28/2007)   TG: 155.0 (07/28/2008)  Problem # 5:  PREVENTIVE HEALTH CARE (ICD-V70.0) The pt was asked about all immunizations, health maint. services that are appropriate to their age and was given guidance on  diet exercize  and weight management  Orders: First annual wellness visit with prevention plan  [phone])  Mammogram: normal (06/01/2009) Pap smear: normal (07/03/2008) Colonoscopy: DONE (05/12/2009) Bone Density: abnormal (10/03/2007) Td Booster: Td (07/28/2008)   Flu Vax: Fluvax 3+ (01/15/2009)   Pneumovax: given (03/05/1999) Chol: 209 (07/28/2008)   HDL: 49.90 (07/28/2008)   LDL: DEL (06/28/2007)   TG: 155.0 (07/28/2008) TSH: 1.45 (03/05/2009)   HgbA1C: 5.6 (07/26/2006)   Next mammogram due:: 06/01/2010 (08/21/2009) Next Colonoscopy due:: 05/2014 (05/12/2009) Next Bone Density due:: 10/2009 (08/21/2009)  Discussed using sunscreen, use of alcohol, drug use, self breast exam, routine  dental care, routine eye care, schedule for GYN exam, routine physical exam, seat belts, multiple vitamins, osteoporosis prevention, adequate calcium intake in diet, recommendations for immunizations, mammograms and Pap smears.  Discussed exercise and checking cholesterol.  Discussed gun safety, safe sex, and contraception.  Complete Medication List: 1)  Synthroid 88 Mcg Tabs (Levothyroxine sodium) .... Once daily 2)  Xanax 0.25 Mg Tabs (Alprazolam) .... As needed 3)  Calcium 500/d 500-200 Mg-unit Tabs (Calcium carbonate-vitamin d) .... 2 a day 4)  Vytorin 10-20 Mg Tabs (Ezetimibe-simvastatin) .... Once daily 5)  Align Caps (Misc intestinal flora regulat) .... One by mouth daily 6)  Dexilant 60 Mg Cpdr (Dexlansoprazole) .[name] Kitchen.. 1 once daily 7)  Klor-con 8 Meq Cr-tabs (Potassium chloride) .[name] Kitchen.. 1 once daily 8)  Vitamin D 1200  .[name] Kitchen.. 1 once daily 9)  Niferex-pn Forte Tabs (Prenatal vit-fe psac cmplx-fa) .... One by mouth daily ( may sub for generic) 10)  Voltaren 1 % Gel (Diclofenac sodium) .... Use as directed 11)  Analpram-hc 1-2.5 % Crea (Hydrocortisone ace-pramoxine) .... Apply to hemorrhoids two times a day  Other Orders: TLB-Lipid Panel (80061-LIPID) TLB-Hepatic/Liver Function Pnl (80076-HEPATIC) TLB-IBC  Pnl (Iron/FE;Transferrin) (83550-IBC) TLB-CBC Platelet - w/Differential (85025-CBCD) T-Vitamin D (25-Hydroxy) (27253-66440)  Patient Instructions: 1)  Please schedule a follow-up appointment in 3 months.   Immunization History:  Influenza Immunization History:    Influenza:  fluvax 3+ (01/15/2009)    Preventive Care Screening  Bone Density:    Date:  10/03/2007    Next Due:  10/2009    Results:  abnormal std dev  Mammogram:    Date:  06/01/2009    Next Due:  03/2010     Results:  normal   Pap Smear:    Next Due:  07/2010   Last Flu Shot:    Date:  01/15/2009    Results:  Fluvax 3+   Last Pneumovax:    Date:  03/05/1999    Results:  given   pt is allergic
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
## 2010-06-04 ENCOUNTER — Other Ambulatory Visit: Payer: Self-pay | Admitting: Internal Medicine

## 2010-06-10 ENCOUNTER — Encounter: Payer: Self-pay | Admitting: Internal Medicine

## 2010-06-10 ENCOUNTER — Ambulatory Visit (INDEPENDENT_AMBULATORY_CARE_PROVIDER_SITE_OTHER): Payer: Medicare Other | Admitting: Internal Medicine

## 2010-06-10 VITALS — BP 124/70 | HR 84 | Temp 97.9°F | Resp 16 | Ht 64.0 in | Wt 121.0 lb

## 2010-06-10 DIAGNOSIS — R1319 Other dysphagia: Secondary | ICD-10-CM

## 2010-06-10 DIAGNOSIS — M818 Other osteoporosis without current pathological fracture: Secondary | ICD-10-CM

## 2010-06-10 DIAGNOSIS — I10 Essential (primary) hypertension: Secondary | ICD-10-CM

## 2010-06-10 MED ORDER — ESOMEPRAZOLE MAGNESIUM 40 MG PO CPDR: 40.0000 mg | DELAYED_RELEASE_CAPSULE | Freq: Every day | ORAL | Status: DC

## 2010-06-10 MED ORDER — GABAPENTIN 100 MG PO TABS: 100.0000 mg | ORAL_TABLET | Freq: Every day | ORAL | Status: DC

## 2010-06-10 MED ORDER — METOCLOPRAMIDE HCL 5 MG PO TABS: 5.0000 mg | ORAL_TABLET | Freq: Every evening | ORAL | Status: DC | PRN

## 2010-06-10 NOTE — Progress Notes (Signed)
Subjective:    Patient ID: Rebecca [name], female    DOB: [birth year], 77 y.o.   MRN: [id number]  HPI   this is a 77 year old white female who presents for followup of chronic reflux esophagitis and has increased symptoms of reflux with hoarseness she also has symptoms of increased gas and abdominal bloating.  She was on Reglan in the distant past but was discontinued she has been on multiple proton pump inhibitors most recently she was on Prilosec 40 without success she has been on Paxil and which was successful but the cost to her and that impact on her Medicare prescription benefit was unstable.  Otherwise she is stable her blood pressure stable she denies any chest pain shortness breath  orthopnea other than hoarseness she is doing well  Review of Systems  Constitutional: Negative for activity change, appetite change and fatigue.  HENT: Negative for ear pain, congestion, neck pain, postnasal drip and sinus pressure.   Eyes: Negative for redness and visual disturbance.  Respiratory: Negative for cough, shortness of breath and wheezing.   Gastrointestinal: Positive for abdominal distention. Negative for abdominal pain.  Genitourinary: Negative for dysuria, frequency and menstrual problem.  Musculoskeletal: Negative for myalgias, joint swelling and arthralgias.  Skin: Negative for rash and wound.  Neurological: Negative for dizziness, weakness and headaches.  Hematological: Negative for adenopathy. Does not bruise/bleed easily.  Psychiatric/Behavioral: Negative for sleep disturbance and decreased concentration.   Past Medical History  Diagnosis Date  . Hypertension   . Diverticulosis   . Esophageal stricture   . FH: colon polyps   . Anxiety   . Hemorrhoids   . Gastroparesis   . Anemia    Past Surgical History  Procedure Date  . Tonsillectomy   . Appendectomy   . Arcuate keratectomy   . Knee surgery bilateral   . Hernia repair   . Double pallital tori bone removed    .  Morton's neuroma on rt foot and platar facial release   . Mouth surgery     reports that she has never smoked. She does not have any smokeless tobacco history on file. She reports that she does not drink alcohol or use illicit drugs. family history includes Cancer in her maternal aunt and maternal grandmother; Colon polyps in her brother, daughter, and sister; Crohn's disease in an unspecified family member; Diabetes in an unspecified family member; Hypertension in an unspecified family member; and Migraines in an unspecified family member. Allergies  Allergen Reactions  . Ciprofloxacin     REACTION: Thrush  . Codeine   . Pneumococcal Vaccine Polyvalent     REACTION: RED AND RAISED RASH ON ARM  . Sulfamethoxazole W/Trimethoprim   . Tramadol Hcl        Objective:   Physical Exam  Constitutional: She is oriented to person, place, and time. She appears well-developed and well-nourished. No distress.  HENT:  Head: Normocephalic and atraumatic.  Right Ear: External ear normal.  Left Ear: External ear normal.  Nose: Nose normal.       Hoarse  oropharynx is erythematous without cobblestoning  Eyes: Conjunctivae and EOM are normal. Pupils are equal, round, and reactive to light.  Neck: Normal range of motion. Neck supple. No JVD present. No tracheal deviation present. No thyromegaly present.  Cardiovascular: Normal rate, regular rhythm, normal heart sounds and intact distal pulses.   No murmur heard. Pulmonary/Chest: Effort normal and breath sounds normal. She has no wheezes. She exhibits no tenderness.  Abdominal: Soft. Bowel sounds  are normal.  Musculoskeletal: Normal range of motion. She exhibits no edema and no tenderness.  Lymphadenopathy:    She has no cervical adenopathy.  Neurological: She is alert and oriented to person, place, and time. She has normal reflexes. No cranial nerve deficit.  Skin: Skin is warm and dry. She is not diaphoretic.  Psychiatric: She has a normal mood  and affect. Her behavior is normal.          Assessment & Plan:
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
## 2010-06-10 NOTE — Assessment & Plan Note (Signed)
The pt is on the generic of prilosec  And the cost is extremly high and she notes increased hoarseness and GERD symptoms with increased gas

## 2010-06-10 NOTE — Assessment & Plan Note (Signed)
The pts vit d dropped from 55 to 22 in Nov due monitoring today

## 2010-06-11 LAB — VITAMIN D 25 HYDROXY (VIT D DEFICIENCY, FRACTURES): Vit D, 25-Hydroxy: 59 ng/mL (ref 30–89)

## 2010-06-16 ENCOUNTER — Other Ambulatory Visit: Payer: Self-pay | Admitting: Internal Medicine

## 2010-07-02 ENCOUNTER — Encounter: Payer: Self-pay | Admitting: Internal Medicine

## 2010-07-08 LAB — POCT CARDIAC MARKERS
CKMB, poc: 1.9 ng/mL (ref 1.0–8.0)
Myoglobin, poc: 188 ng/mL (ref 12–200)
Troponin i, poc: <0.05 ng/mL (ref 0.00–0.09)

## 2010-07-08 LAB — CBC
HCT: 36.1 % (ref 36.0–46.0)
Hemoglobin: 12.2 g/dL (ref 12.0–15.0)
MCHC: 33.8 g/dL (ref 30.0–36.0)
MCV: 93.3 fL (ref 78.0–100.0)
Platelets: 304 10*3/uL (ref 150–400)
RBC: 3.87 MIL/uL (ref 3.87–5.11)
RDW: 14 % (ref 11.5–15.5)
WBC: 7.3 10*3/uL (ref 4.0–10.5)

## 2010-07-08 LAB — DIFFERENTIAL
Basophils Absolute: 0 10*3/uL (ref 0.0–0.1)
Basophils Relative: 1 % (ref 0–1)
Eosinophils Absolute: 0.1 10*3/uL (ref 0.0–0.7)
Eosinophils Relative: 1 % (ref 0–5)
Lymphocytes Relative: 25 % (ref 12–46)
Lymphs Abs: 1.8 10*3/uL (ref 0.7–4.0)
Monocytes Absolute: 0.7 10*3/uL (ref 0.1–1.0)
Monocytes Relative: 9 % (ref 3–12)
Neutro Abs: 4.6 10*3/uL (ref 1.7–7.7)
Neutrophils Relative %: 64 % (ref 43–77)

## 2010-07-08 LAB — BASIC METABOLIC PANEL
BUN: 11 mg/dL (ref 6–23)
CO2: 24 mEq/L (ref 19–32)
Calcium: 9.9 mg/dL (ref 8.4–10.5)
Chloride: 93 mEq/L — ABNORMAL LOW (ref 96–112)
Creatinine, Ser: 0.95 mg/dL (ref 0.4–1.2)
GFR calc Af Amer: >60 mL/min (ref >60)
GFR calc non Af Amer: 58 mL/min — ABNORMAL LOW (ref >60)
Glucose, Bld: 132 mg/dL — ABNORMAL HIGH (ref 70–99)
Potassium: 3.6 mEq/L (ref 3.5–5.1)
Sodium: 125 mEq/L — ABNORMAL LOW (ref 135–145)

## 2010-07-08 LAB — URINALYSIS, ROUTINE W REFLEX MICROSCOPIC
Bilirubin Urine: NEGATIVE
Glucose, UA: NEGATIVE mg/dL
Hgb urine dipstick: NEGATIVE
Ketones, ur: NEGATIVE mg/dL
Nitrite: NEGATIVE
Protein, ur: NEGATIVE mg/dL
Specific Gravity, Urine: 1.015 (ref 1.005–1.030)
Urobilinogen, UA: 0.2 mg/dL (ref 0.0–1.0)
pH: 7.5 (ref 5.0–8.0)

## 2010-07-12 ENCOUNTER — Other Ambulatory Visit: Payer: Self-pay | Admitting: Internal Medicine

## 2010-07-15 ENCOUNTER — Ambulatory Visit (INDEPENDENT_AMBULATORY_CARE_PROVIDER_SITE_OTHER): Payer: Medicare Other | Admitting: Family Medicine

## 2010-07-15 ENCOUNTER — Encounter: Payer: Self-pay | Admitting: Family Medicine

## 2010-07-15 VITALS — BP 150/80 | Temp 98.3°F | Wt 120.0 lb

## 2010-07-15 DIAGNOSIS — D649 Anemia, unspecified: Secondary | ICD-10-CM

## 2010-07-15 DIAGNOSIS — M25579 Pain in unspecified ankle and joints of unspecified foot: Secondary | ICD-10-CM

## 2010-07-15 LAB — POCT I-STAT, CHEM 8
BUN: 8 mg/dL (ref 6–23)
Calcium, Ion: 1.25 mmol/L (ref 1.12–1.32)
Chloride: 101 mEq/L (ref 96–112)
Creatinine, Ser: 0.9 mg/dL (ref 0.4–1.2)
Glucose, Bld: 100 mg/dL — ABNORMAL HIGH (ref 70–99)
HCT: 42 % (ref 36.0–46.0)
Hemoglobin: 14.3 g/dL (ref 12.0–15.0)
Potassium: 3.6 mEq/L (ref 3.5–5.1)
Sodium: 138 mEq/L (ref 135–145)
TCO2: 25 mmol/L (ref 0–100)

## 2010-07-15 LAB — BASIC METABOLIC PANEL
BUN: 9 mg/dL (ref 6–23)
CO2: 28 mEq/L (ref 19–32)
Calcium: 10.1 mg/dL (ref 8.4–10.5)
Chloride: 102 mEq/L (ref 96–112)
Creatinine, Ser: 0.84 mg/dL (ref 0.4–1.2)
GFR calc Af Amer: >60 mL/min (ref >60)
GFR calc non Af Amer: >60 mL/min (ref >60)
Glucose, Bld: 103 mg/dL — ABNORMAL HIGH (ref 70–99)
Potassium: 3.7 mEq/L (ref 3.5–5.1)
Sodium: 136 mEq/L (ref 135–145)

## 2010-07-15 LAB — IRON AND TIBC
%SAT: 15 % — ABNORMAL LOW (ref 20–55)
Iron: 43 ug/dL (ref 42–145)
TIBC: 283 ug/dL (ref 250–470)
UIBC: 240 ug/dL

## 2010-07-15 LAB — IRON: Iron: 41 ug/dL — ABNORMAL LOW (ref 42–145)

## 2010-07-15 LAB — FERRITIN: Ferritin: 51.4 ng/mL (ref 10.0–291.0)

## 2010-07-15 MED ORDER — NIFEREX-PN FORTE PO TABS: 1.0000 | ORAL_TABLET | Freq: Every day | ORAL | Status: DC

## 2010-07-15 NOTE — Progress Notes (Signed)
  Subjective:    Patient ID: Rebecca [name], female    DOB: [birth year], 77 y.o.   MRN: [id number]  HPI Patient is seen for evaluation of anemia. Past history of anemia couple years ago and had upper and lower endoscopy last year basically unremarkable with a few benign polyps. She had extensive workup over a year ago with normal B12 and folate. Celiac antibody test negative. Hemoglobin previously 10 range with low iron saturation. Her anemia eventually improved with iron replacement. She had hemoglobin 12.4 July of 2011 and recent labs per orthopedist 10.8 with normal MCV.  Patient denies any stool changes, appetite changes, weight changes, abdominal pain, dizziness. Does have some fatigue. Currently not taking any iron supplement.  Had been seen recently for right ankle edema by orthopedist. X-rays reportedly unremarkable. Prednisone resolved her edema. She had multiple other labs including electrolytes and several markers for inflammatory disease which were all negative. TSH normal.   Review of Systems  Constitutional: Positive for fever.  Respiratory: Negative for cough and shortness of breath.   Cardiovascular: Negative for chest pain and palpitations.  Gastrointestinal: Negative for nausea, vomiting, abdominal pain, diarrhea, constipation, blood in stool and abdominal distention.  Genitourinary: Negative for dysuria.  Musculoskeletal: Positive for arthralgias.  Neurological: Negative for dizziness, syncope and headaches.  Hematological: Negative for adenopathy. Does not bruise/bleed easily.       Objective:   Physical Exam  Constitutional: She is oriented to person, place, and time. She appears well-developed and well-nourished. No distress.  HENT:  Head: Normocephalic and atraumatic.  Eyes: Conjunctivae and EOM are normal. Pupils are equal, round, and reactive to light.  Neck: Neck supple. No thyromegaly present.  Cardiovascular: Normal rate, regular rhythm and normal heart  sounds.   Pulmonary/Chest: Effort normal and breath sounds normal. No respiratory distress. She has no wheezes. She has no rales.  Abdominal: Soft. Bowel sounds are normal. She exhibits no distension and no mass. There is no tenderness. There is no rebound and no guarding.  Musculoskeletal: She exhibits no edema.  Lymphadenopathy:    She has no cervical adenopathy.  Neurological: She is alert and oriented to person, place, and time. No cranial nerve deficit.          Assessment & Plan:  #1 recurrent normocytic anemia with prior history of mild iron deficiency with recent endoscopic workup unrevealing. Patient is postmenopausal of course. Questionable AVM or other source of intermittent bleed. Recheck iron studies. Get back on iron replacement and recommended followup with primary one-month repeat hemoglobin #2 recent monoarticular arthritis right ankle. Patient's been referred to rheumatology for further evaluation. No evidence for inflammatory arthritis.

## 2010-07-15 NOTE — Patient Instructions (Signed)
Iron Rich Diet An iron rich diet contains foods that are good sources of iron. Iron is an important mineral. It is used to make hemoglobin. Hemoglobin is a protein needed for red blood cells so that oxygen can be carried through the body. The iron level in the blood can be low. Reasons for low iron in the blood include:  Lack of iron in the diet.   Blood loss.   During times of growth such as the growth and development of children or pregnancy.  Low levels of iron can cause a decrease in the number of red blood cells. This can result in iron deficiency anemia. Iron deficiency anemia symptoms include:  Lack of energy.   Increased chance of infection.   Other health problems.  Males older than 75 years of age need 8mg of iron per day. Women ages 19-50 need 18mg per day. Pregnant women need 27 mg per day, and women who are over 19 years of age and breastfeeding need 9mg per day. Women over the age of 50 need 8mg of iron per day. SOURCES OF IRON: There are two types of iron that are found in food; heme and non-heme iron. Heme iron is absorbed by the body better than non-heme iron. Heme iron is found in meat, poultry and fish. Non-heme iron is found in grains, beans, and vegetables. Heme iron sources:  Food Amount of iron in milligrams (mg)  3 oz. chicken liver.  10  3 oz. beef liver. 5.5  3 oz. oysters. 8  3 oz. beef. 2-3  3 oz. shrimp. 2.8  3 oz. turkey. 2  3 oz. chicken. 1  3 oz. fish (tuna, halibut). 1  3 oz. pork. 0.9  Non-heme iron sources: Food Amount of iron in milligrams (mg)  Ready-to-eat breakfast cereal, iron fortified. 3.9-7   cup tofu. 3.4   cup kidney beans. 2.6  Baked potato with skin. 2.7   cup asparagus. 2.2  Avocado. 2   cup dried peaches. 1.6   cup raisins. 1.5   1 cup soy milk. 1.5  1 slice whole wheat bread. 1.2  1 cup spinach. 0.8   cup broccoli. 0.6  CERTAIN FOODS INCREASE AND DECREASE IRON ABSORPTION: Foods that can DECREASE the body's absorption  of iron include:  Coffee.  Tea.   Fiber.  Soy.   Try to avoid these foods and beverages while eating meals with iron containing foods. Foods containing vitamin C INCREASE the body's absorption of iron. Foods that are high in vitamin C include many fruits and vegetables. Some good sources are:  Fresh orange juice.  Oranges.   Strawberries.   Mangos.   Grapefruit.   Red bell peppers.  Green bell peppers.   Broccoli.   Potatoes with skin.   Tomato juice.   Foods containing vitamin C can help increase the amount of iron your body absorbs from iron sources, especially from non-heme sources. Eat foods with vitamin C along with iron containing foods to increase your iron absorption. Document Released: 11/02/2004 Document Re-Released: 04/10/2007 [employer] Patient Information 2011 [employer]. 

## 2010-07-16 ENCOUNTER — Telehealth: Payer: Self-pay

## 2010-07-16 NOTE — Telephone Encounter (Signed)
Pt aware and notes that she went to pick the iron supplement up from the pharm to find out that it is no longer on the market. However, there is a supplement behind the counter that doesn't require a rx. Pt wants to know if she should get that iron supplement.

## 2010-07-16 NOTE — Telephone Encounter (Signed)
Talked with pt- saw dr burchette  A few days ago and iron and ferritin was low and he called in prenatal vitamins with iron supplements- not made anymore- pharmacy suggest to take 1 behind counter, but she is afraid it will hurt her stomach- she will have pharmacy call us with the name and ingredients on monday

## 2010-07-19 ENCOUNTER — Other Ambulatory Visit: Payer: Self-pay | Admitting: *Deleted

## 2010-07-19 ENCOUNTER — Encounter: Payer: Self-pay | Admitting: Family Medicine

## 2010-07-19 DIAGNOSIS — D649 Anemia, unspecified: Secondary | ICD-10-CM

## 2010-07-19 MED ORDER — POLYSACCHARIDE IRON COMPLEX 150 MG PO CAPS: 150.0000 mg | ORAL_CAPSULE | Freq: Every day | ORAL | Status: DC

## 2010-07-19 NOTE — Telephone Encounter (Signed)
Per dr Lovell Sheehan- may have nu iron-called in,but pt thinks may be too large to swallow- will go with the behind the counter if too large

## 2010-07-23 ENCOUNTER — Encounter: Payer: Self-pay | Admitting: Internal Medicine

## 2010-07-26 ENCOUNTER — Other Ambulatory Visit: Payer: Self-pay | Admitting: Orthopaedic Surgery

## 2010-07-26 DIAGNOSIS — M25472 Effusion, left ankle: Secondary | ICD-10-CM

## 2010-07-26 DIAGNOSIS — M25572 Pain in left ankle and joints of left foot: Secondary | ICD-10-CM

## 2010-07-28 ENCOUNTER — Ambulatory Visit
Admission: RE | Admit: 2010-07-28 | Discharge: 2010-07-28 | Disposition: A | Discharge status: Home / Self Care | Payer: Medicare Other | Source: Ambulatory Visit | Attending: Orthopaedic Surgery | Admitting: Orthopaedic Surgery

## 2010-07-28 DIAGNOSIS — M25472 Effusion, left ankle: Secondary | ICD-10-CM

## 2010-07-28 DIAGNOSIS — M25572 Pain in left ankle and joints of left foot: Secondary | ICD-10-CM

## 2010-08-03 ENCOUNTER — Ambulatory Visit
Admission: RE | Admit: 2010-08-03 | Discharge: 2010-08-03 | Disposition: A | Discharge status: Home / Self Care | Payer: Medicare Other | Source: Ambulatory Visit | Attending: Orthopaedic Surgery | Admitting: Orthopaedic Surgery

## 2010-08-03 ENCOUNTER — Other Ambulatory Visit: Payer: Self-pay | Admitting: Orthopaedic Surgery

## 2010-08-03 DIAGNOSIS — R52 Pain, unspecified: Secondary | ICD-10-CM

## 2010-08-03 LAB — HM MAMMOGRAPHY

## 2010-08-03 LAB — HM PAP SMEAR

## 2010-08-05 ENCOUNTER — Telehealth: Payer: Self-pay | Admitting: *Deleted

## 2010-08-05 MED ORDER — PREDNISONE 10 MG PO TABS: 10.0000 mg | ORAL_TABLET | Freq: Every day | ORAL | Status: DC

## 2010-08-05 NOTE — Telephone Encounter (Signed)
In March, pt began having a swollen and painful ankle and saw Ortho.  CT was negative.  Then her other ankle began to swell, and get very sore, so she was sent to Rheumatology.  Rheum says she has fluid in both ankles and blood flow is compromised??? All her Lupus, and related disease testing was negative.  She feels this is a medical problem now, and wants Dr. Lovell Sheehan to take over.  Wonders why she couldn't try a diuretic, but the worst problem is the painful right ankle is very uncomfortable day and night.  She used Celebrex, but it did not help her feet and ankles.  Did help her other arthritis. Dopplers done a few months ago were negative that Dr. Lovell Sheehan ordered.  Please give advice.  Next appt is in June for CPX.

## 2010-08-05 NOTE — Telephone Encounter (Signed)
Pt wants Dr. Lovell Sheehan to know she had a Pred Pak at the end of March if that will cause any problem with this new pres of Prednisone.????

## 2010-08-05 NOTE — Telephone Encounter (Signed)
Could do a trial of low level steroids with prednisone 10 mg by mouth daily if this is effective she could slowly titrated down to 5 mg daily Singulair and prednisone 10 mg with one refill

## 2010-08-05 NOTE — Telephone Encounter (Signed)
Should not

## 2010-08-05 NOTE — Telephone Encounter (Signed)
Singulair is ERROR.

## 2010-08-17 NOTE — Op Note (Signed)
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   POSTOPERATIVE DIAGNOSIS:  Maxillary palatal torus measuring  approximately 4.0 cm x 3.0 cm.   SURGERY PERFORMED:  Removal of maxillary palatal torus.   SURGEON:  Rebecca [name], DDS   ASSISTANT:  Rebecca [name].   ANESTHESIA:  General via nasoendotracheal intubation.   ESTIMATED BLOOD LOSS:  Less than 20 mL.   PREPARATION:  Approximately 1000 mL crystalloid solutions.   COMPLICATIONS:  None apparent.   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  The patient says that the lesion has been  present for years, but it has been slowly enlarging to the point that  food was getting stuck around it, she was getting chronic infections,  and having difficulty swallowing.  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  Due to the large size of the lesion and the  proximity to the oropharynx, it was recommended that the procedure be  removed under general anesthesia with intubation so that the airway  could be maintained and not compromised during this removal.  Removal in  an office setting would have compromised the patient's airway.   DETAILS OF PROCEDURE:  On July 02, 2008, Rebecca [name] was taken to  [facility] where she was placed on the operating  room table in a supine position.  Following successful nasoendotracheal  intubation and general anesthesia, the patient's face, neck, and oral  cavity were prepped and draped in the usual sterile operating room  fashion.  The hypopharynx was suctioned free of fluids and secretions  and a moistened 2-inch vaginal pack was placed as a throat pack.   Attention was directed intraorally, where approximately 3 mL of 0.5%  Xylocaine containing 1:200,000 epinephrine were infiltrated in the  maxillary palatal soft tissues surrounding the torus.  Allowing for the  vasoconstrictive effects to occur, a #15 Bard-Parker [name] was then used  to create a 4.0 linear incision in an anterior-posterior direction from  the posterior hard palate to a distance of approximately 1 cm anterior  to the maxillary torus.  A #9 periosteal elevator was then used to  reflect a full-thickness mucoperiosteal flap laterally to the right and  left exposing the large bony torus.  A Stryker rotary osteotome with a  straight fissure bur was then used to section the torus in an anterior-  posterior direction and right-to-left direction.   A curved osteotome was then directed and placed against the torus and  with gentle tapping pressure from a fiberglass-tipped mallet, these  sections were removed from the bony hard  palate.  The bony tissue was  removed from the oral cavity using rongeurs and cutting forceps.   The Stryker rotary osteotome with an alveolar bur was then used to  reduce the remainder of the torus and smooth the margins, flushed with  the maxillary bony hard palate.  A small osseous file was then used to  further smooth the palate.  The area was then thoroughly irrigated with  sterile saline irrigating solution and suctioned.  The mucoperiosteal  margins were then approximated and sutured in a continuous interlocking  fashion using 4-0 black silk suture material.   The oral cavity was then thoroughly irrigated and suctioned.  The throat  pack was removed, and the hypopharynx suctioned free of fluids and  secretions.   Rebecca [name] was allowed to  awaken from the anesthesia and taken to the  recovery room where she appeared to have tolerated the procedure well  without apparent complication.           ______________________________  Rebecca [name], D.D.S.     DC/MEDQ  D:  07/02/2008  T:  07/03/2008  Job:  [id number]   cc:   Stacie Glaze, MD

## 2010-08-20 NOTE — Assessment & Plan Note (Signed)
[facility]                                 ON-CALL NOTE   NASIM, GAROFANO                    MRN:          [id number]  DATE:06/15/2006                            DOB:          1932-08-28    #[id number]   PRIMARY CARE PHYSICIAN:  Darryll Capers, M.D.   The patient had a CT scan this afternoon for abdominal symptoms and has  been on antibiotics, was told by the radiology tech to go home, but to  get mag citrate and Dr. Lovell Sheehan' office would call her.  However, she  has not heard anything from the office and does not know what to do with  the mag citrate.  I have discussed with her she is not worse with fever  and no new symptoms, but I have no information about the results of the  scan.  It would be unlikely to be presurgical situation, taking mag  citrate, but that she can hold off on taking this and call the office  first thing in the morning about her results, because she does not know  the exact results of the scan, but she can call back if she is worse in  the meantime.     Neta Mends. Fabian Sharp, MD  Electronically Signed    WKP/MedQ  DD: 06/15/2006  DT: 06/17/2006  Job #: [id number]   cc:   Stacie Glaze, MD

## 2010-08-20 NOTE — H&P (Signed)
[place]  Patient:    Rebecca [name], Rebecca [name]                      MRN: [id number] Adm. Date:  09/13/00 Attending:  Darci Needle, M.D. CC:         Richard A. Jacky Kindle, M.D.   History and Physical  DATE OF BIRTH:  [birth year]  REASON FOR ADMISSION:  Chest discomfort.  SUBJECTIVE:  Callie is a 77 year old lady with a history of chest discomfort that is recurrent since early this morning.  The discomfort has awakened her from sleep over the past evening.  It is in the left precordial and axillary region.  The arms are not involved and there is no radiation to the neck.  She denies exertional dyspnea and there has been no diaphoresis.  She poorly characterizes the discomfort.  She does use a motion with her hands as if there is pressure on the chest, although she does not use that word to describe the discomfort.  Her chest feels better now than it did this morning when it awakened her several times from sleep but it is still not normal. PAST MEDICAL HISTORY: 1. History of hyperlipidemia. 2. Hypothyroidism. 3. History of reflux. 4. History of ruptured lumbar disk. 5. History of umbilical hernia status post repair 1996 by Dr. Francina Ames. 6. Status post arthroscopy in the left knee.  SOCIAL HISTORY:  Does not smoke or drink and has never done so.  She is 67. 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  She is married to [place] who is also a patient here at [facility] and has three children, two female and one female, all three in good health.  MEDICATIONS: 1. Lopid 600 mg b.i.d. 2. Zantac 150 mg p.o. b.i.d. 3. Synthroid 0.1 mg p.o. q.d.  FAMILY HISTORY:  Father has a history of heart disease and had abdominal aortic aneurysm that ruptured and caused death at age 38.  Mother died at age 34 of complications of myocardial infarction and atrial fibrillation.  She has three brothers, two of whom have had coronary  artery bypass surgery in their 34s.  She has a sister who died of lung cancer at age 19.  One sister is alive and well.  A third brother has had no cardiac complaints.  REVIEW OF SYSTEMS:  Some difficulty swallowing, indigestion, low back discomfort, history of thyroid trouble.  PHYSICAL EXAMINATION  GENERAL:  Patient is in no acute distress.  She appears her stated age.  VITAL SIGNS:  Blood pressure 160/80, heart rate 64, weight 124 pounds.  SKIN:  Clear.  No nail bed cyanosis.  HEENT:  No xanthelasma.  Extraocular movements are full.  NECK:  No JVD, carotid bruits, or thyromegaly.  LUNGS:  Clear to auscultation and percussion.  CARDIAC:  No clicks, rubs, murmurs, or gallops.  PMI is not palpable.  ABDOMEN:  Soft.  Liver and spleen are not palpable.  Bowel sounds are normal. No bruits are heard.  EXTREMITIES:  No edema.  Femoral pulses are 2+.  Posterior tibial pulses are 2+.  Radial pulses are 2+.  NEUROLOGIC:  Patient is alert and oriented x 3.  No motor or sensory deficits are noted.  LABORATORIES:  EKG provided by Dr. Jacky Kindle from this morning is normal with slight J point depression when compared to a previous EKG from a year ago, but all in all I still believe the EKG  this morning is normal when compared to September 28, 1998 EKG provided by Dr. Jacky Kindle.  A chest x-ray done a year ago was also normal.  There was a recent endoscopy that demonstrated an esophageal stricture that was dilated by Dr. Melvia Heaps.  Recent total cholesterol from October 19, 2000 was a total of 343, LDL 256, HDL 51.  ASSESSMENT: 1. Chest discomfort, atypical, but in patient with family history of coronary    disease and extremely high cholesterol despite Lopid therapy. 2. Mild elevation in systolic blood pressure. 3. History of hypothyroidism.  PLAN: 1. Admit to rule out MI unit. 2. Serial enzymes. 3. Serial EKGs. 4. Stress Cardiolite if enzymes and EKGs remain negative. 5. Cardiac  catheterization if objective evidence of ischemia or if she rules    in for infarction. DD:  09/13/00 TD:  09/13/00 Job: [id number]

## 2010-09-17 ENCOUNTER — Ambulatory Visit (INDEPENDENT_AMBULATORY_CARE_PROVIDER_SITE_OTHER): Payer: Medicare Other | Admitting: Internal Medicine

## 2010-09-17 ENCOUNTER — Encounter: Payer: Self-pay | Admitting: Internal Medicine

## 2010-09-17 DIAGNOSIS — E785 Hyperlipidemia, unspecified: Secondary | ICD-10-CM

## 2010-09-17 DIAGNOSIS — E039 Hypothyroidism, unspecified: Secondary | ICD-10-CM

## 2010-09-17 DIAGNOSIS — I10 Essential (primary) hypertension: Secondary | ICD-10-CM

## 2010-09-17 DIAGNOSIS — M818 Other osteoporosis without current pathological fracture: Secondary | ICD-10-CM

## 2010-09-17 DIAGNOSIS — M4120 Other idiopathic scoliosis, site unspecified: Secondary | ICD-10-CM

## 2010-09-17 DIAGNOSIS — IMO0002 Reserved for concepts with insufficient information to code with codable children: Secondary | ICD-10-CM

## 2010-09-17 DIAGNOSIS — Z0000 Encounter for general adult medical examination without abnormal findings: Secondary | ICD-10-CM

## 2010-09-17 DIAGNOSIS — G5790 Unspecified mononeuropathy of unspecified lower limb: Secondary | ICD-10-CM

## 2010-09-17 DIAGNOSIS — D649 Anemia, unspecified: Secondary | ICD-10-CM

## 2010-09-17 LAB — CBC WITH DIFFERENTIAL/PLATELET
Basophils Absolute: 0 10*3/uL (ref 0.0–0.1)
Basophils Relative: 0.4 % (ref 0.0–3.0)
Eosinophils Absolute: 0.2 10*3/uL (ref 0.0–0.7)
Eosinophils Relative: 1.9 % (ref 0.0–5.0)
HCT: 33.1 % — ABNORMAL LOW (ref 36.0–46.0)
Hemoglobin: 11.1 g/dL — ABNORMAL LOW (ref 12.0–15.0)
Lymphocytes Relative: 19.7 % (ref 12.0–46.0)
Lymphs Abs: 2.2 10*3/uL (ref 0.7–4.0)
MCHC: 33.6 g/dL (ref 30.0–36.0)
MCV: 94.1 fl (ref 78.0–100.0)
Monocytes Absolute: 0.8 10*3/uL (ref 0.1–1.0)
Monocytes Relative: 6.8 % (ref 3.0–12.0)
Neutro Abs: 8.1 10*3/uL — ABNORMAL HIGH (ref 1.4–7.7)
Neutrophils Relative %: 71.2 % (ref 43.0–77.0)
Platelets: 321 10*3/uL (ref 150.0–400.0)
RBC: 3.51 Mil/uL — ABNORMAL LOW (ref 3.87–5.11)
RDW: 14.9 % — ABNORMAL HIGH (ref 11.5–14.6)
WBC: 11.4 10*3/uL — ABNORMAL HIGH (ref 4.5–10.5)

## 2010-09-17 LAB — LIPID PANEL
Cholesterol: 190 mg/dL (ref 0–200)
HDL: 52.8 mg/dL (ref >39.00)
Total CHOL/HDL Ratio: 4
Triglycerides: 246 mg/dL — ABNORMAL HIGH (ref 0.0–149.0)
VLDL: 49.2 mg/dL — ABNORMAL HIGH (ref 0.0–40.0)

## 2010-09-17 LAB — BASIC METABOLIC PANEL
BUN: 11 mg/dL (ref 6–23)
CO2: 28 mEq/L (ref 19–32)
Calcium: 9.7 mg/dL (ref 8.4–10.5)
Chloride: 102 mEq/L (ref 96–112)
Creatinine, Ser: 0.8 mg/dL (ref 0.4–1.2)
GFR: 70.14 mL/min (ref >60.00)
Glucose, Bld: 97 mg/dL (ref 70–99)
Potassium: 4.2 mEq/L (ref 3.5–5.1)
Sodium: 136 mEq/L (ref 135–145)

## 2010-09-17 LAB — HEPATIC FUNCTION PANEL
ALT: 12 U/L (ref 0–35)
AST: 15 U/L (ref 0–37)
Albumin: 3.9 g/dL (ref 3.5–5.2)
Alkaline Phosphatase: 80 U/L (ref 39–117)
Bilirubin, Direct: 0.1 mg/dL (ref 0.0–0.3)
Total Bilirubin: 0.6 mg/dL (ref 0.3–1.2)
Total Protein: 6.7 g/dL (ref 6.0–8.3)

## 2010-09-17 LAB — LDL CHOLESTEROL, DIRECT: Direct LDL: 102.5 mg/dL

## 2010-09-17 LAB — IRON: Iron: 25 ug/dL — ABNORMAL LOW (ref 42–145)

## 2010-09-17 MED ORDER — CYANOCOBALAMIN 1000 MCG/ML IJ SOLN
1000.0000 ug | Freq: Once | INTRAMUSCULAR | Status: AC
  Administered 2010-09-17: 1000 ug via INTRAMUSCULAR

## 2010-09-17 MED ORDER — GABAPENTIN 300 MG PO CAPS: 300.0000 mg | ORAL_CAPSULE | Freq: Every day | ORAL | Status: DC

## 2010-09-17 NOTE — Progress Notes (Signed)
Addended by: Alfred Levins D on: 09/17/2010 01:25 PM   Modules accepted: Orders

## 2010-09-17 NOTE — Progress Notes (Signed)
Addended by: Rita Ohara R on: 09/17/2010 02:21 PM   Modules accepted: Orders

## 2010-09-17 NOTE — Progress Notes (Signed)
Subjective:    Patient ID: Rebecca [name], female    DOB: 08-13-1932, 77 y.o.   MRN: [id number]  HPI    Review of Systems     Objective:   Physical Exam        Assessment & Plan:   Subjective:    Rebecca [name] is a 77 y.o. female who presents for Medicare Annual/Subsequent preventive examination. The pt has severe joint problems in LE has seen a Rhematologist who suggested vascular ( vein) problems. She is hoarse. GYN recommended reclast but she refused and has been on oral She has deep pain in the legs  Preventive Screening-Counseling & Management  Tobacco History  Smoking status  . Never Smoker   Smokeless tobacco  . Not on file     Problems Prior to Visit 1.   Current Problems (verified) Patient Active Problem List  Diagnoses  . ADENOMATOUS COLONIC POLYP  . HYPOTHYROIDISM, PRIMARY  . HYPERLIPIDEMIA  . ANEMIA, IRON DEFICIENCY  . MIGRAINE, OPHTHALMIC  . POLYNEUROPATHY OTHER DISEASES CLASSIFIED ELSW  . HYPERTENSION  . CAROTID ARTERY DISEASE  . UNSPECIFIED PERIPHERAL VASCULAR DISEASE  . ALLERGIC RHINITIS CAUSE UNSPECIFIED  . GERD  . GASTROPARESIS  . DIVERTICULOSIS, COLON  . SPINAL STENOSIS, LUMBAR  . LUMBAR RADICULOPATHY, ATYPICAL  . GANGLION OF TENDON SHEATH  . OTHER OSTEOPOROSIS  . SCOLIOSIS, LUMBAR SPINE  . DYSPHAGIA  . ADVEF, DRUG/MEDICINAL/BIOLOGICAL SUBST NOS    Medications Prior to Visit Current Outpatient Prescriptions on File Prior to Visit  Medication Sig Dispense Refill  . ALPRAZolam (XANAX) 0.25 MG tablet Take 0.25 mg by mouth as needed.        . Calcium Carbonate-Vitamin D (CALCIUM-VITAMIN D) 500-200 MG-UNIT per tablet Take 1 tablet by mouth 2 (two) times daily with a meal.        . Cholecalciferol (VITAMIN D3) 2000 UNITS TABS Take by mouth daily.        [name] Kitchen esomeprazole (NEXIUM) 40 MG capsule Take 1 capsule (40 mg total) by mouth daily.  30 capsule  1  . ezetimibe-simvastatin (VYTORIN) 10-20 MG per tablet Take 1 tablet by  mouth at bedtime.        . gabapentin (NEURONTIN) 100 MG capsule TAKE 1 CAPSULE BY MOUTH AT BEDTIME IF NEEDED  30 capsule  2  . gabapentin (NEURONTIN) 100 MG tablet Take 1 tablet (100 mg total) by mouth daily.      . iron polysaccharides (NU-IRON) 150 MG capsule Take 1 capsule (150 mg total) by mouth daily.  30 capsule  6  . metoCLOPramide (REGLAN) 5 MG tablet Take 1 tablet (5 mg total) by mouth at bedtime and may repeat dose one time if needed.  90 tablet  6  . olmesartan (BENICAR) 20 MG tablet Take 20 mg by mouth daily.        . predniSONE (DELTASONE) 10 MG tablet Take 1 tablet (10 mg total) by mouth daily.  30 tablet  1  . Probiotic Product (ALIGN PO) Take by mouth daily.        [name] Kitchen SYNTHROID 88 MCG tablet take 1 tablet by mouth once daily  30 tablet  9  . VOLTAREN 1 % GEL use as directed  100 g  1    Current Medications (verified) Current Outpatient Prescriptions  Medication Sig Dispense Refill  . ALPRAZolam (XANAX) 0.25 MG tablet Take 0.25 mg by mouth as needed.        . Calcium Carbonate-Vitamin D (CALCIUM-VITAMIN D) 500-200 MG-UNIT per tablet  Take 1 tablet by mouth 2 (two) times daily with a meal.        . Cholecalciferol (VITAMIN D3) 2000 UNITS TABS Take by mouth daily.        [name] Kitchen esomeprazole (NEXIUM) 40 MG capsule Take 1 capsule (40 mg total) by mouth daily.  30 capsule  1  . ezetimibe-simvastatin (VYTORIN) 10-20 MG per tablet Take 1 tablet by mouth at bedtime.        . gabapentin (NEURONTIN) 100 MG capsule TAKE 1 CAPSULE BY MOUTH AT BEDTIME IF NEEDED  30 capsule  2  . gabapentin (NEURONTIN) 100 MG tablet Take 1 tablet (100 mg total) by mouth daily.      . iron polysaccharides (NU-IRON) 150 MG capsule Take 1 capsule (150 mg total) by mouth daily.  30 capsule  6  . metoCLOPramide (REGLAN) 5 MG tablet Take 1 tablet (5 mg total) by mouth at bedtime and may repeat dose one time if needed.  90 tablet  6  . olmesartan (BENICAR) 20 MG tablet Take 20 mg by mouth daily.        . predniSONE  (DELTASONE) 10 MG tablet Take 1 tablet (10 mg total) by mouth daily.  30 tablet  1  . Probiotic Product (ALIGN PO) Take by mouth daily.        [name] Kitchen SYNTHROID 88 MCG tablet take 1 tablet by mouth once daily  30 tablet  9  . VOLTAREN 1 % GEL use as directed  100 g  1     Allergies (verified) Ciprofloxacin; Codeine; Pneumococcal vaccine polyvalent; Sulfamethoxazole w/trimethoprim; and Tramadol hcl   PAST HISTORY  Family History Family History  Problem Relation Age of Onset  . Crohn's disease    . Migraines    . Colon polyps Sister   . Colon polyps Brother   . Colon polyps Daughter   . Cancer Maternal Aunt     colon  . Diabetes Mother   . Heart disease Mother   . Anuerysm Father   . Cancer Paternal Grandmother     colon    Social History History  Substance Use Topics  . Smoking status: Never Smoker   . Smokeless tobacco: Not on file  . Alcohol Use: No     Are there smokers in your home (other than you)? No  Risk Factors Current exercise habits: The patient does not participate in regular exercise at present.  Dietary issues discussed: none   Cardiac risk factors: advanced age (older than 34 for men, 76 for women), dyslipidemia, hypertension and sedentary lifestyle.  Depression Screen (Note: if answer to either of the following is "Yes", a more complete depression screening is indicated)   Over the past two weeks, have you felt down, depressed or hopeless? No  Over the past two weeks, have you felt little interest or pleasure in doing things? No  Have you lost interest or pleasure in daily life? No  Do you often feel hopeless? No  Do you cry easily over simple problems? No  Activities of Daily Living In your present state of health, do you have any difficulty performing the following activities?:  Driving? No Managing money?  No Feeding yourself? No Getting from bed to chair? No Climbing a flight of stairs? No Preparing food and eating?: No Bathing or showering?  No Getting dressed: No Getting to the toilet? No Using the toilet:No Moving around from place to place: No In the past year have you fallen or had a near  fall?:No   Are you sexually active?  No  Do you have more than one partner?  No  Hearing Difficulties: No Do you often ask people to speak up or repeat themselves? No Do you experience ringing or noises in your ears? No Do you have difficulty understanding soft or whispered voices? No   Do you feel that you have a problem with memory? Yes  Do you often misplace items? Yes  Do you feel safe at home?  Yes  Cognitive Testing  Alert? Yes  Normal Appearance?Yes  Oriented to person? Yes  Place? Yes   Time? Yes  Recall of three objects?  Yes  Can perform simple calculations? Yes  Displays appropriate judgment?Yes  Can read the correct time from a watch face?Yes   Advanced Directives have been discussed with the patient? Yes  List the Names of Other Physician/Practitioners you currently use: 1.    Indicate any recent Medical Services you may have received from other than [facility] providers in the past year (date may be approximate).  Immunization History  Administered Date(s) Administered  . H1N1 06/02/2008  . Influenza Whole 04/04/2001, 02/01/2007, 12/24/2007, 12/16/2008, 01/15/2009, 01/05/2010  . Pneumococcal Polysaccharide 03/05/1999  . Td 04/05/1999, 07/28/2008  . Zoster 10/24/2007    Screening Tests Health Maintenance  Topic Date Due  . Pneumococcal Polysaccharide Vaccine Age 49 And Over  02/20/2000  . Influenza Vaccine  01/03/2011  . Tetanus/tdap  07/29/2018  . Colonoscopy  05/13/2019  . Zostavax  Completed    All answers were reviewed with the patient and necessary referrals were made:  Carrie Mew   09/17/2010   History reviewed: allergies, current medications, past family history, past medical history, past social history, past surgical history and problem list  Review of Systems A comprehensive review of  systems was negative except for: Ears, nose, mouth, throat, and face: positive for hearing loss and nasal congestion Cardiovascular: positive for dyspnea Gastrointestinal: positive for constipation Musculoskeletal: positive for stiff joints and polyarthritis    Objective:     Vision by Snellen chart: right eye:20/20, left eye:20/20  Body mass index is 20.60 kg/(m^2). BP 130/70  Pulse 72  Temp 98.6 F (37 C)  Resp 16  Ht 5\' 4"  (1.626 m)  Wt 120 lb (54.432 kg)  BMI 20.60 kg/m2  General appearance: alert, appears stated age and no distress Head: Normocephalic, without obvious abnormality, atraumatic Eyes: conjunctivae/corneas clear. PERRL, EOM's intact. Fundi benign. Nose: Nares normal. Septum midline. Mucosa normal. No drainage or sinus tenderness. Neck: no adenopathy, no carotid bruit, no JVD, supple, symmetrical, trachea midline and thyroid not enlarged, symmetric, no tenderness/mass/nodules Back: symmetric, no curvature. ROM normal. No CVA tenderness. Lungs: clear to auscultation bilaterally and normal percussion bilaterally Heart: regular rate and rhythm Abdomen: soft, non-tender; bowel sounds normal; no masses,  no organomegaly Extremities: varicose veins noted and venous stasis dermatitis noted Pulses: 2+ and symmetric Skin: Skin color, texture, turgor normal. No rashes or lesions Lymph nodes: Cervical, supraclavicular, and axillary nodes normal.   on physical examination I cannot find any lower extreme edema her pulses are good he has good capillary refill she does have persistent erythema over the medial ankles   Assessment:      This is a routine physical examination for this healthy  Female. Reviewed all health maintenance protocols including mammography colonoscopy bone density and reviewed appropriate screening labs. Her immunization history was reviewed as well as her current medications and allergies refills of her chronic medications were given and  the plan for  yearly health maintenance was discussed all orders and referrals were made as appropriate.      Plan:     During the course of the visit the patient was educated and counseled about appropriate screening and preventive services including:    Influenza vaccine  Screening mammography  Advanced directives: has an advanced directive - a copy HAS NOT been provided.  Diet review for nutrition referral? Yes ____  Not Indicated ____x   Patient Instructions (the written plan) was given to the patient.  Medicare Attestation I have personally reviewed: The patient's medical and social history Their use of alcohol, tobacco or illicit drugs Their current medications and supplements The patient's functional ability including ADLs,fall risks, home safety risks, cognitive, and hearing and visual impairment Diet and physical activities Evidence for depression or mood disorders  The patient's weight, height, BMI, and visual acuity have been recorded in the chart.  I have made referrals, counseling, and provided education to the patient based on review of the above and I have provided the patient with a written personalized care plan for preventive services.    A working diagnosis for her lower extremity pain and discomfort may be peripheral neuropathy.  I believe an EMG and nerve conduction velocity may be useful in helping to figure out the etiology of her pain and B12 supplementation would be our first step in treatment.  Appropriate labs will be drawn today to screen for renal disease cholesterol liver and complete blood count and we will obtain the blood work from the rheumatologist  Carrie Mew   09/17/2010

## 2010-09-21 ENCOUNTER — Telehealth: Payer: Self-pay | Admitting: Internal Medicine

## 2010-09-21 NOTE — Telephone Encounter (Signed)
Patient had a check-up on Friday - was not able to give another urine sample on Friday - if needed pt will come in to give another urine sample (pt had a urine check a few weeks ago) or bring a copy of the urine test. Patient also request a copy of her latest labs.

## 2010-09-21 NOTE — Telephone Encounter (Signed)
No need for ua- labs given to pt and copy up front - per dr Lovell Sheehan- needs stool cards for decreasing iron level-pt informed

## 2010-10-04 ENCOUNTER — Encounter: Payer: PRIVATE HEALTH INSURANCE | Admitting: Internal Medicine

## 2010-10-04 ENCOUNTER — Encounter: Payer: Self-pay | Admitting: Neurosurgery

## 2010-10-05 ENCOUNTER — Other Ambulatory Visit (INDEPENDENT_AMBULATORY_CARE_PROVIDER_SITE_OTHER): Payer: Medicare Other

## 2010-10-05 DIAGNOSIS — IMO0001 Reserved for inherently not codable concepts without codable children: Secondary | ICD-10-CM

## 2010-10-05 DIAGNOSIS — K921 Melena: Secondary | ICD-10-CM

## 2010-10-05 LAB — HEMOCCULT GUIAC POC 1CARD (OFFICE)
Card #2 Fecal Occult Blod, POC: NEGATIVE
Card #3 Fecal Occult Blood, POC: NEGATIVE
Fecal Occult Blood, POC: NEGATIVE

## 2010-10-20 ENCOUNTER — Ambulatory Visit (INDEPENDENT_AMBULATORY_CARE_PROVIDER_SITE_OTHER): Payer: Medicare Other | Admitting: Internal Medicine

## 2010-10-20 ENCOUNTER — Encounter: Payer: Self-pay | Admitting: Internal Medicine

## 2010-10-20 VITALS — BP 120/70 | HR 72 | Temp 98.2°F | Resp 16 | Ht 64.0 in | Wt 124.0 lb

## 2010-10-20 DIAGNOSIS — M48061 Spinal stenosis, lumbar region without neurogenic claudication: Secondary | ICD-10-CM

## 2010-10-20 DIAGNOSIS — D509 Iron deficiency anemia, unspecified: Secondary | ICD-10-CM

## 2010-10-20 DIAGNOSIS — I739 Peripheral vascular disease, unspecified: Secondary | ICD-10-CM

## 2010-10-20 DIAGNOSIS — D518 Other vitamin B12 deficiency anemias: Secondary | ICD-10-CM

## 2010-10-20 DIAGNOSIS — I10 Essential (primary) hypertension: Secondary | ICD-10-CM

## 2010-10-20 DIAGNOSIS — IMO0002 Reserved for concepts with insufficient information to code with codable children: Secondary | ICD-10-CM

## 2010-10-20 DIAGNOSIS — D519 Vitamin B12 deficiency anemia, unspecified: Secondary | ICD-10-CM

## 2010-10-20 DIAGNOSIS — G63 Polyneuropathy in diseases classified elsewhere: Secondary | ICD-10-CM

## 2010-10-20 DIAGNOSIS — I6529 Occlusion and stenosis of unspecified carotid artery: Secondary | ICD-10-CM

## 2010-10-20 MED ORDER — CYANOCOBALAMIN 1000 MCG/ML IJ SOLN
1000.0000 ug | INTRAMUSCULAR | Status: DC
  Administered 2010-10-20: 1000 ug via INTRAMUSCULAR

## 2010-10-20 NOTE — Patient Instructions (Signed)
Increase the Reglan to a half a tablet before supper a half a tablet before lunch and a half before bedtime if this causes some improvement then adding another half before breakfast for the total of four half tablets

## 2010-10-20 NOTE — Assessment & Plan Note (Signed)
b12 and iron deficiency Stool cards normal

## 2010-10-20 NOTE — Progress Notes (Signed)
Subjective:    Patient ID: Rebecca [name], female    DOB: [birth year], 77 y.o.   MRN: [id number]  HPI The patient presents for followup of worsening neuropathy and anemia.  B12 and iron levels were obtained and she has apparently a persistent iron deficiency stool cards were also obtained which were negative for Hemoccult detectable blood loss. Since she has begun to B12 shots and sublingual B12 she has noticed an improvement in tremor and neuropathy. Based upon the blood work obtained in June her iron  The patient relates a history that her sister requires iron infusions due to an iron deficiency  Review of Systems  Constitutional: Negative for activity change, appetite change and fatigue.  HENT: Negative for ear pain, congestion, neck pain, postnasal drip and sinus pressure.   Eyes: Negative for redness and visual disturbance.  Respiratory: Negative for cough, shortness of breath and wheezing.   Gastrointestinal: Negative for abdominal pain and abdominal distention.  Genitourinary: Negative for dysuria, frequency and menstrual problem.  Musculoskeletal: Negative for myalgias, joint swelling and arthralgias.  Skin: Negative for rash and wound.  Neurological: Negative for dizziness, weakness and headaches.  Hematological: Negative for adenopathy. Does not bruise/bleed easily.  Psychiatric/Behavioral: Negative for sleep disturbance and decreased concentration.   Past Medical History  Diagnosis Date  . Hypertension   . Diverticulosis   . Esophageal stricture   . FH: colon polyps   . Anxiety   . Hemorrhoids   . Gastroparesis   . Anemia    Past Surgical History  Procedure Date  . Tonsillectomy   . Appendectomy   . Arcuate keratectomy   . Knee surgery bilateral   . Hernia repair   . Double pallital tori bone removed    . Morton's neuroma on rt foot and platar facial release   . Mouth surgery     reports that she has never smoked. She does not have any smokeless tobacco  history on file. She reports that she does not drink alcohol or use illicit drugs. family history includes Anuerysm in her father; Cancer in her maternal aunt and paternal grandmother; Colon polyps in her brother, daughter, and sister; Crohn's disease in an unspecified family member; Diabetes in her mother; Heart disease in her mother; and Migraines in an unspecified family member. Allergies  Allergen Reactions  . Ciprofloxacin     REACTION: Thrush  . Codeine   . Pneumococcal Vaccine Polyvalent     REACTION: RED AND RAISED RASH ON ARM  . Sulfamethoxazole W/Trimethoprim   . Tramadol Hcl        Objective:   Physical Exam  Nursing note and vitals reviewed. Constitutional: She is oriented to person, place, and time. She appears well-developed and well-nourished. No distress.       pale  HENT:  Head: Normocephalic and atraumatic.  Right Ear: External ear normal.  Left Ear: External ear normal.  Nose: Nose normal.  Mouth/Throat: Oropharynx is clear and moist.  Eyes: Conjunctivae and EOM are normal. Pupils are equal, round, and reactive to light.  Neck: Normal range of motion. Neck supple. No JVD present. No tracheal deviation present. No thyromegaly present.  Cardiovascular: Normal rate, regular rhythm, normal heart sounds and intact distal pulses.   No murmur heard. Pulmonary/Chest: Effort normal and breath sounds normal. She has no wheezes. She exhibits no tenderness.  Abdominal: Soft. Bowel sounds are normal.  Musculoskeletal: Normal range of motion. She exhibits no edema and no tenderness.  Lymphadenopathy:    She has  no cervical adenopathy.  Neurological: She is alert and oriented to person, place, and time. She has normal reflexes. No cranial nerve deficit.  Skin: Skin is warm and dry. She is not diaphoretic.  Psychiatric: She has a normal mood and affect. Her behavior is normal.          Assessment & Plan:  We will continue with the B12 injections and she comes every 3  months and I recommend continuing the sublingual B12 we will measure and higher-level today and if the iron level remains low then she'll be set up for IV iron and effusions.  Her liver functions and renal functions were excellent with no indication of renal disease or liver problems.  The last vitamin D. level was 59 which is excellent and her thyroid was a TSH of 0.88 which is excellent She is due carotid duplex for carotid disease

## 2010-10-20 NOTE — Progress Notes (Signed)
Addended by: Willy Eddy on: 10/20/2010 06:06 PM   Modules accepted: Orders

## 2010-10-21 LAB — IRON: Iron: 25 ug/dL — ABNORMAL LOW (ref 42–145)

## 2010-10-26 ENCOUNTER — Encounter: Payer: Self-pay | Admitting: *Deleted

## 2010-10-26 ENCOUNTER — Telehealth: Payer: Self-pay | Admitting: *Deleted

## 2010-10-26 NOTE — Telephone Encounter (Signed)
done

## 2010-11-02 ENCOUNTER — Other Ambulatory Visit: Payer: Self-pay | Admitting: Internal Medicine

## 2010-11-03 ENCOUNTER — Encounter (HOSPITAL_COMMUNITY): Payer: Medicare Other | Attending: Internal Medicine

## 2010-11-03 DIAGNOSIS — D509 Iron deficiency anemia, unspecified: Secondary | ICD-10-CM | POA: Insufficient documentation

## 2010-11-03 DIAGNOSIS — E039 Hypothyroidism, unspecified: Secondary | ICD-10-CM | POA: Insufficient documentation

## 2010-11-04 ENCOUNTER — Encounter: Payer: Medicare Other | Admitting: *Deleted

## 2010-11-04 ENCOUNTER — Telehealth: Payer: Self-pay | Admitting: *Deleted

## 2010-11-04 NOTE — Telephone Encounter (Signed)
Left message for pt to stay on iron per Dr. Lovell Sheehan.

## 2010-11-04 NOTE — Telephone Encounter (Signed)
Pt wants to know if she continues iron since she had her infusion yesterday.

## 2010-11-04 NOTE — Telephone Encounter (Signed)
Yes stay on iron per dr Lovell Sheehan

## 2010-11-18 ENCOUNTER — Other Ambulatory Visit: Payer: Self-pay | Admitting: Internal Medicine

## 2010-11-18 DIAGNOSIS — I6529 Occlusion and stenosis of unspecified carotid artery: Secondary | ICD-10-CM

## 2010-11-19 ENCOUNTER — Encounter (INDEPENDENT_AMBULATORY_CARE_PROVIDER_SITE_OTHER): Payer: Medicare Other | Admitting: *Deleted

## 2010-11-19 DIAGNOSIS — I6529 Occlusion and stenosis of unspecified carotid artery: Secondary | ICD-10-CM

## 2010-12-03 ENCOUNTER — Other Ambulatory Visit: Payer: Self-pay | Admitting: *Deleted

## 2010-12-03 ENCOUNTER — Telehealth: Payer: Self-pay | Admitting: *Deleted

## 2010-12-03 MED ORDER — AZITHROMYCIN 250 MG PO TABS: ORAL_TABLET | ORAL | Status: AC

## 2010-12-03 NOTE — Telephone Encounter (Signed)
Pt was requesting a Zpack.  Ok per Dr. Lovell Sheehan for sinus.

## 2010-12-03 NOTE — Telephone Encounter (Signed)
Pt is complaining of a sinus infection with cough x 2 weeks, and would like a ZPack, please.  [facility] ([place]).

## 2010-12-03 NOTE — Telephone Encounter (Signed)
May have z pack per dr jenkins 

## 2010-12-03 NOTE — Telephone Encounter (Signed)
Left message on pt's personal voicemail 

## 2010-12-29 ENCOUNTER — Ambulatory Visit (INDEPENDENT_AMBULATORY_CARE_PROVIDER_SITE_OTHER): Payer: Medicare Other | Admitting: Internal Medicine

## 2010-12-29 ENCOUNTER — Encounter: Payer: Self-pay | Admitting: Internal Medicine

## 2010-12-29 VITALS — BP 120/70 | HR 72 | Temp 98.2°F | Resp 16 | Ht 64.0 in | Wt 121.0 lb

## 2010-12-29 DIAGNOSIS — E538 Deficiency of other specified B group vitamins: Secondary | ICD-10-CM

## 2010-12-29 DIAGNOSIS — E611 Iron deficiency: Secondary | ICD-10-CM

## 2010-12-29 DIAGNOSIS — Z23 Encounter for immunization: Secondary | ICD-10-CM

## 2010-12-29 DIAGNOSIS — D649 Anemia, unspecified: Secondary | ICD-10-CM

## 2010-12-29 LAB — CBC WITH DIFFERENTIAL/PLATELET
Basophils Absolute: 0.1 10*3/uL (ref 0.0–0.1)
Basophils Relative: 0.7 % (ref 0.0–3.0)
Eosinophils Absolute: 0.3 10*3/uL (ref 0.0–0.7)
Eosinophils Relative: 3.4 % (ref 0.0–5.0)
HCT: 34.5 % — ABNORMAL LOW (ref 36.0–46.0)
Hemoglobin: 11.5 g/dL — ABNORMAL LOW (ref 12.0–15.0)
Lymphocytes Relative: 31.9 % (ref 12.0–46.0)
Lymphs Abs: 3.2 10*3/uL (ref 0.7–4.0)
MCHC: 33.4 g/dL (ref 30.0–36.0)
MCV: 94.6 fl (ref 78.0–100.0)
Monocytes Absolute: 0.9 10*3/uL (ref 0.1–1.0)
Monocytes Relative: 8.8 % (ref 3.0–12.0)
Neutro Abs: 5.6 10*3/uL (ref 1.4–7.7)
Neutrophils Relative %: 55.2 % (ref 43.0–77.0)
Platelets: 286 10*3/uL (ref 150.0–400.0)
RBC: 3.64 Mil/uL — ABNORMAL LOW (ref 3.87–5.11)
RDW: 14.3 % (ref 11.5–14.6)
WBC: 10.1 10*3/uL (ref 4.5–10.5)

## 2010-12-29 LAB — IRON: Iron: 41 ug/dL — ABNORMAL LOW (ref 42–145)

## 2010-12-29 LAB — VITAMIN B12: Vitamin B-12: >1500 pg/mL — ABNORMAL HIGH (ref 211–911)

## 2010-12-29 MED ORDER — CYANOCOBALAMIN 1000 MCG/ML IJ SOLN
1000.0000 ug | INTRAMUSCULAR | Status: DC
  Administered 2010-12-29: 1000 ug via INTRAMUSCULAR

## 2010-12-29 NOTE — Patient Instructions (Signed)
Your free to change the dose of Neurontin based upon how your feet are doing if you hurting more increase her Neurontin back to the 300 if your feet are stable you can keep it at 100 dose.  We'll get a B12 shot today Monitor your B12 iron and blood count today if they are stable levels we will monitor in 4 months if your iron starts to drop below normal need to do another infusion

## 2010-12-29 NOTE — Progress Notes (Signed)
Addended by: Willy Eddy on: 12/29/2010 01:51 PM   Modules accepted: Orders

## 2010-12-29 NOTE — Progress Notes (Signed)
Subjective:    Patient ID: Rebecca [name], female    DOB: 1932/12/31, 77 y.o.   MRN: [id number]  HPI Patient presents for followup on diabetes diet deficiency anemia she was unable to supplement her iron levels by oral supplementation she would begin IV infusion therapy she responded well with increased strength we're here to monitor her persistence of an acceptable I and level and CBC as well as monitor her for her B12 deficiency.  She apparently has multifactor vitamin deficiency anemia she also has comorbid hypothyroidism hypertension hyperlipidemia and chronic gastroesophageal reflux for which she's been on proton pump inhibitors long-term( 20+ years)   Review of Systems  Constitutional: Negative for activity change, appetite change and fatigue.  HENT: Negative for ear pain, congestion, neck pain, postnasal drip and sinus pressure.   Eyes: Negative for redness and visual disturbance.  Respiratory: Negative for cough, shortness of breath and wheezing.   Gastrointestinal: Negative for abdominal pain and abdominal distention.  Genitourinary: Negative for dysuria, frequency and menstrual problem.  Musculoskeletal: Negative for myalgias, joint swelling and arthralgias.  Skin: Negative for rash and wound.  Neurological: Negative for dizziness, weakness and headaches.  Hematological: Negative for adenopathy. Does not bruise/bleed easily.  Psychiatric/Behavioral: Negative for sleep disturbance and decreased concentration.       Past Medical History  Diagnosis Date  . Hypertension   . Diverticulosis   . Esophageal stricture   . FH: colon polyps   . Anxiety   . Hemorrhoids   . Gastroparesis   . Anemia    Past Surgical History  Procedure Date  . Tonsillectomy   . Appendectomy   . Arcuate keratectomy   . Knee surgery bilateral   . Hernia repair   . Double pallital tori bone removed    . Morton's neuroma on rt foot and platar facial release   . Mouth surgery     reports that  she has never smoked. She does not have any smokeless tobacco history on file. She reports that she does not drink alcohol or use illicit drugs. family history includes Anuerysm in her father; Cancer in her maternal aunt and paternal grandmother; Colon polyps in her brother, daughter, and sister; Crohn's disease in an unspecified family member; Diabetes in her mother; Heart disease in her mother; and Migraines in an unspecified family member. Allergies  Allergen Reactions  . Ciprofloxacin     REACTION: Thrush  . Codeine   . Pneumococcal Vaccine Polyvalent     REACTION: RED AND RAISED RASH ON ARM  . Sulfamethoxazole W/Trimethoprim   . Tramadol Hcl     Objective:   Physical Exam  Nursing note and vitals reviewed. Constitutional: She is oriented to person, place, and time. She appears well-developed and well-nourished. No distress.  HENT:  Head: Normocephalic and atraumatic.  Right Ear: External ear normal.  Left Ear: External ear normal.  Nose: Nose normal.  Mouth/Throat: Oropharynx is clear and moist.  Eyes: Conjunctivae and EOM are normal. Pupils are equal, round, and reactive to light.  Neck: Normal range of motion. Neck supple. No JVD present. No tracheal deviation present. No thyromegaly present.  Cardiovascular: Normal rate, regular rhythm, normal heart sounds and intact distal pulses.   No murmur heard. Pulmonary/Chest: Effort normal and breath sounds normal. She has no wheezes. She exhibits no tenderness.  Abdominal: Soft. Bowel sounds are normal.  Musculoskeletal: Normal range of motion. She exhibits no edema and no tenderness.  Lymphadenopathy:    She has no cervical adenopathy.  Neurological: She is alert and oriented to person, place, and time. She has normal reflexes. No cranial nerve deficit.  Skin: Skin is warm and dry. She is not diaphoretic.  Psychiatric: She has a normal mood and affect. Her behavior is normal.          Assessment & Plan:  Monitor iron, B12  and CBC today for persistence of levels after iron infusion therapy and determine frequency of iron infusion therapy. Fatigue has improved.  Gastroesophageal reflux is stable on proton pump inhibitor hypertension as stated

## 2011-01-16 ENCOUNTER — Other Ambulatory Visit: Payer: Self-pay | Admitting: Internal Medicine

## 2011-01-20 ENCOUNTER — Encounter (HOSPITAL_COMMUNITY): Payer: Medicare Other | Attending: Internal Medicine

## 2011-01-20 DIAGNOSIS — E039 Hypothyroidism, unspecified: Secondary | ICD-10-CM | POA: Insufficient documentation

## 2011-01-20 DIAGNOSIS — D509 Iron deficiency anemia, unspecified: Secondary | ICD-10-CM | POA: Insufficient documentation

## 2011-01-24 ENCOUNTER — Telehealth: Payer: Self-pay | Admitting: *Deleted

## 2011-01-24 NOTE — Telephone Encounter (Signed)
Patient states that she does not have f/u appointment scheduled until January 2013, but is unsure of when she is to have another Iron infusion. Pt request call back.

## 2011-01-25 NOTE — Telephone Encounter (Signed)
Will rec heck iron in January- last iron infusion was 10-18

## 2011-02-01 ENCOUNTER — Telehealth: Payer: Self-pay | Admitting: *Deleted

## 2011-02-01 ENCOUNTER — Encounter: Payer: Self-pay | Admitting: Internal Medicine

## 2011-02-01 ENCOUNTER — Ambulatory Visit (INDEPENDENT_AMBULATORY_CARE_PROVIDER_SITE_OTHER): Payer: Medicare Other | Admitting: Internal Medicine

## 2011-02-01 VITALS — BP 120/70 | HR 72 | Temp 97.9°F | Resp 16 | Ht 64.0 in | Wt 121.0 lb

## 2011-02-01 DIAGNOSIS — D518 Other vitamin B12 deficiency anemias: Secondary | ICD-10-CM

## 2011-02-01 DIAGNOSIS — R300 Dysuria: Secondary | ICD-10-CM

## 2011-02-01 DIAGNOSIS — D519 Vitamin B12 deficiency anemia, unspecified: Secondary | ICD-10-CM

## 2011-02-01 LAB — POCT URINALYSIS DIPSTICK
Bilirubin, UA: NEGATIVE
Blood, UA: NEGATIVE
Glucose, UA: NEGATIVE
Ketones, UA: NEGATIVE
Nitrite, UA: NEGATIVE
Protein, UA: NEGATIVE
Spec Grav, UA: 1.01
Urobilinogen, UA: 0.2
pH, UA: 7

## 2011-02-01 MED ORDER — NITROFURANTOIN MONOHYD MACRO 100 MG PO CAPS: 100.0000 mg | ORAL_CAPSULE | Freq: Two times a day (BID) | ORAL | Status: AC

## 2011-02-01 MED ORDER — CYANOCOBALAMIN 1000 MCG/ML IJ SOLN
1000.0000 ug | INTRAMUSCULAR | Status: DC
  Administered 2011-02-01: 1000 ug via INTRAMUSCULAR

## 2011-02-01 NOTE — Progress Notes (Signed)
  Subjective:    Patient ID: Rebecca [name], female    DOB: 10-03-1932, 77 y.o.   MRN: [id number]  HPI  presents with a several day history of dysuria he had a history of recurrent urinary tract infections.  Her current symptoms are dysuria and frequency she has no flank pain or fever   Review of Systems  Genitourinary: Positive for urgency and frequency. Negative for flank pain.       Objective:   Physical Exam  Nursing note and vitals reviewed. Constitutional: She is oriented to person, place, and time. She appears well-developed and well-nourished. No distress.  HENT:  Head: Normocephalic and atraumatic.  Right Ear: External ear normal.  Left Ear: External ear normal.  Nose: Nose normal.  Mouth/Throat: Oropharynx is clear and moist.  Eyes: Conjunctivae and EOM are normal. Pupils are equal, round, and reactive to light.  Neck: Normal range of motion. Neck supple. No JVD present. No tracheal deviation present. No thyromegaly present.  Cardiovascular: Normal rate, regular rhythm, normal heart sounds and intact distal pulses.   No murmur heard. Pulmonary/Chest: Effort normal and breath sounds normal. She has no wheezes. She exhibits no tenderness.  Abdominal: Soft. Bowel sounds are normal.  Musculoskeletal: Normal range of motion. She exhibits no edema and no tenderness.  Lymphadenopathy:    She has no cervical adenopathy.  Neurological: She is alert and oriented to person, place, and time. She has normal reflexes. No cranial nerve deficit.  Skin: Skin is warm and dry. She is not diaphoretic.  Psychiatric: She has a normal mood and affect. Her behavior is normal.          Assessment & Plan:  Acute UTI symptoms were placed on Macrobid 100 mg twice a day for 7 days

## 2011-02-01 NOTE — Patient Instructions (Signed)
Patient was instructed to continue all medications as prescribed. To stop at the checkout desk and schedule a followup appointment  

## 2011-02-01 NOTE — Telephone Encounter (Signed)
Pt states she has an UTI, and would like to come over for a UA today.

## 2011-02-01 NOTE — Telephone Encounter (Signed)
Come at 11 am

## 2011-02-02 ENCOUNTER — Encounter (HOSPITAL_COMMUNITY): Payer: Medicare Other

## 2011-02-07 ENCOUNTER — Telehealth: Payer: Self-pay | Admitting: *Deleted

## 2011-02-07 NOTE — Telephone Encounter (Signed)
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

## 2011-02-08 ENCOUNTER — Other Ambulatory Visit: Payer: Self-pay | Admitting: Internal Medicine

## 2011-03-08 ENCOUNTER — Ambulatory Visit (INDEPENDENT_AMBULATORY_CARE_PROVIDER_SITE_OTHER): Payer: Medicare Other | Admitting: Internal Medicine

## 2011-03-08 ENCOUNTER — Encounter: Payer: Self-pay | Admitting: Internal Medicine

## 2011-03-08 VITALS — BP 120/76 | HR 72 | Temp 98.2°F | Resp 16 | Ht 64.0 in | Wt 124.0 lb

## 2011-03-08 DIAGNOSIS — M7989 Other specified soft tissue disorders: Secondary | ICD-10-CM

## 2011-03-08 DIAGNOSIS — J309 Allergic rhinitis, unspecified: Secondary | ICD-10-CM

## 2011-03-08 DIAGNOSIS — I809 Phlebitis and thrombophlebitis of unspecified site: Secondary | ICD-10-CM

## 2011-03-08 DIAGNOSIS — I10 Essential (primary) hypertension: Secondary | ICD-10-CM

## 2011-03-08 MED ORDER — CEPHALEXIN 500 MG PO CAPS: 500.0000 mg | ORAL_CAPSULE | Freq: Three times a day (TID) | ORAL | Status: AC

## 2011-03-08 NOTE — Patient Instructions (Signed)
The patient is instructed to continue all medications as prescribed. Schedule followup with check out clerk upon leaving the clinic Wrap the foot daily for one week Take the vimovo twice a day for one week

## 2011-03-08 NOTE — Progress Notes (Signed)
Subjective:    Patient ID: Rebecca [name], female    DOB: 12-07-1932, 77 y.o.   MRN: [id number]  HPI The history of hypertension, allergic rhinitis, iron deficiency anemia and peripheral vascular disease and that she has carotid artery disease presents with acute swelling of her right ankle for approximately one week.  She states that she feels pain in her ankle and redness along the medial aspects of the ankle this redness progresses as the day goes along and she notes increased swelling and redness at the end of the day in the morning when she awakens the redness is improved the swelling is improved.  She has significant pain along the medial and lateral aspects of the ankle.  She does not have any weightbearing issues are and she has full range of motion she does not recall trauma to the ankle   Review of Systems  Constitutional: Negative for activity change, appetite change and fatigue.  HENT: Negative for ear pain, congestion, neck pain, postnasal drip and sinus pressure.   Eyes: Negative for redness and visual disturbance.  Respiratory: Negative for cough, shortness of breath and wheezing.   Gastrointestinal: Negative for abdominal pain and abdominal distention.  Genitourinary: Negative for dysuria, frequency and menstrual problem.  Musculoskeletal: Negative for myalgias, joint swelling and arthralgias.  Skin: Negative for rash and wound.  Neurological: Negative for dizziness, weakness and headaches.  Hematological: Negative for adenopathy. Does not bruise/bleed easily.  Psychiatric/Behavioral: Negative for sleep disturbance and decreased concentration.   Past Medical History  Diagnosis Date  . Hypertension   . Diverticulosis   . Esophageal stricture   . FH: colon polyps   . Anxiety   . Hemorrhoids   . Gastroparesis   . Anemia     History   Social History  . Marital Status: Widowed    Spouse Name: N/A    Number of Children: N/A  . Years of Education: N/A    Occupational History  . Not on file.   Social History Main Topics  . Smoking status: Never Smoker   . Smokeless tobacco: Not on file  . Alcohol Use: No  . Drug Use: No  . Sexually Active: Yes   Other Topics Concern  . Not on file   Social History Narrative  . No narrative on file    Past Surgical History  Procedure Date  . Tonsillectomy   . Appendectomy   . Arcuate keratectomy   . Knee surgery bilateral   . Hernia repair   . Double pallital tori bone removed    . Morton's neuroma on rt foot and platar facial release   . Mouth surgery     Family History  Problem Relation Age of Onset  . Crohn's disease    . Migraines    . Colon polyps Sister   . Colon polyps Brother   . Colon polyps Daughter   . Cancer Maternal Aunt     colon  . Diabetes Mother   . Heart disease Mother   . Anuerysm Father   . Cancer Paternal Grandmother     colon    Allergies  Allergen Reactions  . Ciprofloxacin     REACTION: Thrush  . Codeine   . Pneumococcal Vaccine Polyvalent     REACTION: RED AND RAISED RASH ON ARM  . Sulfamethoxazole W/Trimethoprim   . Tramadol Hcl     Current Outpatient Prescriptions on File Prior to Visit  Medication Sig Dispense Refill  . ALPRAZolam (XANAX) 0.25 MG  tablet take 1 tablet by mouth once daily as directed if needed  30 tablet  3  . Calcium Carbonate-Vitamin D (CALCIUM-VITAMIN D) 500-200 MG-UNIT per tablet Take 1 tablet by mouth 2 (two) times daily with a meal.        . Cholecalciferol (VITAMIN D3) 2000 UNITS TABS Take by mouth daily.        [name] Kitchen ezetimibe-simvastatin (VYTORIN) 10-20 MG per tablet Take 1 tablet by mouth at bedtime.        . gabapentin (NEURONTIN) 100 MG capsule Take 100 mg by mouth at bedtime.        . gabapentin (NEURONTIN) 100 MG capsule TAKE 1 CAPSULE BY MOUTH AT BEDTIME IF NEEDED  30 capsule  2  . iron polysaccharides (NU-IRON) 150 MG capsule Take 1 capsule (150 mg total) by mouth daily.  30 capsule  6  . metoCLOPramide (REGLAN)  5 MG tablet Take 1 tablet (5 mg total) by mouth at bedtime and may repeat dose one time if needed.  90 tablet  6  . olmesartan (BENICAR) 20 MG tablet Take 20 mg by mouth daily.        [name] Kitchen omeprazole (PRILOSEC) 20 MG capsule Take 40 mg by mouth daily.        . predniSONE (DELTASONE) 10 MG tablet take 1 tablet by mouth once daily  30 tablet  1  . Probiotic Product (ALIGN PO) Take by mouth daily.        [name] Kitchen SYNTHROID 88 MCG tablet take 1 tablet by mouth once daily  30 tablet  9  . vitamin B-12 (CYANOCOBALAMIN) 500 MCG tablet Take 500 mcg by mouth daily.        . VOLTAREN 1 % GEL use as directed  100 g  1   Current Facility-Administered Medications on File Prior to Visit  Medication Dose Route Frequency Provider Last Rate Last Dose  . DISCONTD: cyanocobalamin ((VITAMIN B-12)) injection 1,000 mcg  1,000 mcg Intramuscular Q30 days Carrie Mew   1,000 mcg at 10/20/10 1805  . DISCONTD: cyanocobalamin ((VITAMIN B-12)) injection 1,000 mcg  1,000 mcg Intramuscular Q30 days Carrie Mew   1,000 mcg at 12/29/10 1349  . DISCONTD: cyanocobalamin ((VITAMIN B-12)) injection 1,000 mcg  1,000 mcg Intramuscular Q30 days Carrie Mew   1,000 mcg at 02/01/11 1131    BP 120/76  Pulse 72  Temp 98.2 F (36.8 C)  Resp 16  Ht 5\' 4"  (1.626 m)  Wt 124 lb (56.246 kg)  BMI 21.28 kg/m2        Objective:   Physical Exam  Nursing note reviewed. Constitutional: She is oriented to person, place, and time. She appears well-developed and well-nourished. No distress.  HENT:  Head: Normocephalic and atraumatic.  Right Ear: External ear normal.  Left Ear: External ear normal.  Nose: Nose normal.  Mouth/Throat: Oropharynx is clear and moist.  Eyes: Conjunctivae and EOM are normal. Pupils are equal, round, and reactive to light.  Neck: Normal range of motion. Neck supple. No JVD present. No tracheal deviation present. No thyromegaly present.  Cardiovascular: Normal rate, regular rhythm, normal heart  sounds and intact distal pulses.   No murmur heard. Pulmonary/Chest: Effort normal and breath sounds normal. She has no wheezes. She exhibits no tenderness.  Abdominal: Soft. Bowel sounds are normal.  Musculoskeletal: She exhibits edema and tenderness.       Approximately a silver dollar-sized area of erythema and soft tissue swelling the medial aspects of the right ankle  Lymphadenopathy:    She has no cervical adenopathy.  Neurological: She is alert and oriented to person, place, and time. She has normal reflexes. No cranial nerve deficit.  Skin: Skin is warm and dry. She is not diaphoretic.  Psychiatric: She has a normal mood and affect. Her behavior is normal.          Assessment & Plan:  Patient has problem with thrombophlebitis of her right ankle she was given wrapped with a Ace bandage and taught how to use it she was instructed to use this during the day and remove at night she was given an anti-inflammatory Naprosyn with Prilosec combined to take twice a day for one week.  Due to the erythema and soft tissue swelling she was also given Keflex 500 mg 3 times a day for 10 days for the possibility of cellulitis.  Her blood pressure is stable both problems appear to be stable at this time she will contact our office in 2-3 days to prevent the progression of this problem if she has significant increased swelling Doppler should be obtained of her lower extremity both arterial and venous to rule out DVT or vascular insufficiency

## 2011-03-14 ENCOUNTER — Telehealth: Payer: Self-pay

## 2011-03-14 DIAGNOSIS — I809 Phlebitis and thrombophlebitis of unspecified site: Secondary | ICD-10-CM

## 2011-03-14 NOTE — Telephone Encounter (Signed)
Needs ulrasound of ankle per dr Lovell Sheehan

## 2011-03-14 NOTE — Telephone Encounter (Signed)
Pt aware to continue bandaging her ankle and to get an ultrasound done.

## 2011-03-14 NOTE — Telephone Encounter (Signed)
Update on pt's progress:  Pt states her ankle is still swollen and red. Pt state she was given vimovo samples and she only has 1 tablet left.  Pt states she has 1 week of Keflex left and her ankle is still wrapped.  Pt would like to know what she needs to do. Pls advise. [phone]

## 2011-03-18 ENCOUNTER — Encounter: Payer: Medicare Other | Admitting: *Deleted

## 2011-03-18 ENCOUNTER — Encounter (INDEPENDENT_AMBULATORY_CARE_PROVIDER_SITE_OTHER): Payer: Medicare Other | Admitting: *Deleted

## 2011-03-18 DIAGNOSIS — I809 Phlebitis and thrombophlebitis of unspecified site: Secondary | ICD-10-CM

## 2011-03-18 DIAGNOSIS — M7989 Other specified soft tissue disorders: Secondary | ICD-10-CM

## 2011-03-18 DIAGNOSIS — L539 Erythematous condition, unspecified: Secondary | ICD-10-CM

## 2011-03-25 ENCOUNTER — Telehealth: Payer: Self-pay | Admitting: *Deleted

## 2011-03-25 NOTE — Telephone Encounter (Signed)
Pt's wanting to know about doppler results. Pt's leg is still hurting and not any worse.

## 2011-03-30 ENCOUNTER — Other Ambulatory Visit: Payer: Self-pay | Admitting: Internal Medicine

## 2011-03-31 ENCOUNTER — Other Ambulatory Visit: Payer: Self-pay | Admitting: Internal Medicine

## 2011-03-31 NOTE — Telephone Encounter (Signed)
Pt notified doppler ok but pt continues to c/o ankle pain- per dr Ival Bible compression hose 10-20#-knee high-pt informed and script sent to [facility]

## 2011-04-08 ENCOUNTER — Other Ambulatory Visit: Payer: Self-pay | Admitting: Internal Medicine

## 2011-04-14 ENCOUNTER — Telehealth: Payer: Self-pay | Admitting: *Deleted

## 2011-04-14 DIAGNOSIS — I83023 Varicose veins of left lower extremity with ulcer of ankle: Secondary | ICD-10-CM

## 2011-04-14 NOTE — Telephone Encounter (Signed)
Pt has been wearing support hose x 3 weeks, and ankle is no better.  Would like Dr. Lovell Sheehan advice.

## 2011-04-15 NOTE — Telephone Encounter (Signed)
Per dr Lovell Sheehan- may have consult to vascular surgeon-

## 2011-04-18 ENCOUNTER — Other Ambulatory Visit: Payer: Self-pay | Admitting: *Deleted

## 2011-04-18 DIAGNOSIS — I739 Peripheral vascular disease, unspecified: Secondary | ICD-10-CM

## 2011-04-20 ENCOUNTER — Ambulatory Visit (INDEPENDENT_AMBULATORY_CARE_PROVIDER_SITE_OTHER): Payer: Medicare Other | Admitting: *Deleted

## 2011-04-20 ENCOUNTER — Ambulatory Visit (INDEPENDENT_AMBULATORY_CARE_PROVIDER_SITE_OTHER): Payer: Medicare Other | Admitting: Vascular Surgery

## 2011-04-20 ENCOUNTER — Encounter: Payer: Self-pay | Admitting: Vascular Surgery

## 2011-04-20 VITALS — BP 129/73 | HR 64 | Resp 16 | Ht 64.0 in | Wt 125.0 lb

## 2011-04-20 DIAGNOSIS — I70219 Atherosclerosis of native arteries of extremities with intermittent claudication, unspecified extremity: Secondary | ICD-10-CM

## 2011-04-20 DIAGNOSIS — M79609 Pain in unspecified limb: Secondary | ICD-10-CM

## 2011-04-20 NOTE — Progress Notes (Signed)
[facility]  Patient name: Rebecca [name] MRN: [id number] DOB: 07-25-1932 Sex: female  REASON FOR CONSULT: pain right ankle  HPI: TERRANCE LANAHAN is a 78 y.o. female who was referred by Dr. Darryll Capers with pain in the right ankle. She noted the gradual onset of pain in her right ankle in late November and early December of 2012. The pain has persisted. She does not return specific injury to her ankle mortise she remember being bitten by anything. She's had no previous history of DVT or phlebitis. Given her persistent pain her workup included a venous duplex scan which showed no evidence of DVT in the right lower extremity and no evidence of significant venous insufficiency. There was no clot noted in the greater saphenous vein either. The study was done on 03/18/2011. Given her persistent pain she sent for vascular consultation.  Past Medical History  Diagnosis Date  . Hypertension   . Diverticulosis   . Esophageal stricture   . FH: colon polyps   . Anxiety   . Hemorrhoids   . Gastroparesis   . Anemia   . Hyperlipidemia     Family History  Problem Relation Age of Onset  . Crohn's disease    . Migraines    . Colon polyps Sister   . Cancer Sister   . Colon polyps Brother   . Diabetes Brother   . Hyperlipidemia Brother   . Hypertension Brother   . Colon polyps Daughter   . Cancer Daughter   . Stroke Daughter   . Cancer Maternal Aunt     colon  . Diabetes Mother   . Heart disease Mother   . Hyperlipidemia Mother   . Hypertension Mother   . Stroke Mother   . Anuerysm Father   . Heart disease Father   . Hypertension Father   . Cancer Paternal Grandmother     colon    SOCIAL HISTORY: History  Substance Use Topics  . Smoking status: Never Smoker   . Smokeless tobacco: Not on file  . Alcohol Use: No    Allergies  Allergen Reactions  . Ciprofloxacin     REACTION: Thrush  . Codeine   . Pneumococcal Vaccine Polyvalent    REACTION: RED AND RAISED RASH ON ARM  . Sulfamethoxazole W/Trimethoprim   . Tramadol Hcl   . Adhesive (Tape) Rash    Current Outpatient Prescriptions  Medication Sig Dispense Refill  . ALPRAZolam (XANAX) 0.25 MG tablet take 1 tablet by mouth once daily if needed  30 tablet  3  . Calcium Carbonate-Vitamin D (CALCIUM-VITAMIN D) 500-200 MG-UNIT per tablet Take 1 tablet by mouth 2 (two) times daily with a meal.        . Cholecalciferol (VITAMIN D3) 2000 UNITS TABS Take by mouth daily.        [name] Kitchen ezetimibe-simvastatin (VYTORIN) 10-20 MG per tablet Take 1 tablet by mouth at bedtime.        [name] Kitchen FERREX 150 150 MG capsule take 1 capsule by mouth once daily  30 capsule  6  . gabapentin (NEURONTIN) 100 MG capsule Take 100 mg by mouth at bedtime.        . gabapentin (NEURONTIN) 100 MG capsule TAKE 1 CAPSULE BY MOUTH AT BEDTIME IF NEEDED  30 capsule  2  . metoCLOPramide (REGLAN) 5 MG tablet Take 1 tablet (5 mg total) by mouth at bedtime and may repeat dose one time if needed.  90 tablet  6  .  NEXIUM 40 MG capsule take 1 capsule by mouth once daily  30 capsule  1  . olmesartan (BENICAR) 20 MG tablet Take 20 mg by mouth daily.        [name] Kitchen omeprazole (PRILOSEC) 20 MG capsule Take 40 mg by mouth daily.        . predniSONE (DELTASONE) 10 MG tablet take 1 tablet by mouth once daily  30 tablet  1  . Probiotic Product (ALIGN PO) Take by mouth daily.        [name] Kitchen SYNTHROID 88 MCG tablet take 1 tablet by mouth once daily  30 tablet  9  . vitamin B-12 (CYANOCOBALAMIN) 500 MCG tablet Take 500 mcg by mouth daily.        . VOLTAREN 1 % GEL use as directed  100 g  1    REVIEW OF SYSTEMS: Arly.[email or web address] ] denotes positive finding; [  ] denotes negative finding CARDIOVASCULAR:  [ ]  chest pain   [ ]  chest pressure   [ ]  palpitations   [ ]  orthopnea   [ ]  dyspnea on exertion   [ ]  claudication   [ ]  rest pain   [ ]  DVT   [ ]  phlebitis PULMONARY:   [ ]  productive cough   [ ]  asthma   [ ]  wheezing NEUROLOGIC:   [ ]  weakness  [ ]  paresthesias   [ ]  aphasia  [ ]  amaurosis  [ ]  dizziness HEMATOLOGIC:   [ ]  bleeding problems   [ ]  clotting disorders MUSCULOSKELETAL:  [ ]  joint pain   [ ]  joint swelling [ ]  leg swelling GASTROINTESTINAL: [ ]   blood in stool  [ ]   hematemesis GENITOURINARY:  [ ]   dysuria  [ ]   hematuria PSYCHIATRIC:  [ ]  history of major depression INTEGUMENTARY:  [ ]  rashes  [ ]  ulcers CONSTITUTIONAL:  [ ]  fever   [ ]  chills  PHYSICAL EXAM: Filed Vitals:   04/20/11 1001  BP: 129/73  Pulse: 64  Resp: 16  Height: 5\' 4"  (1.626 m)  Weight: 125 lb (56.7 kg)  SpO2: 99%   Body mass index is 21.46 kg/(m^2). GENERAL: The patient is a well-nourished female, in no acute distress. The vital signs are documented above. CARDIOVASCULAR: There is a regular rate and rhythm without significant murmur appreciated. I do not detect any carotid bruits. She has palpable femoral pulses and palpable pedal pulses. PULMONARY: There is good air exchange bilaterally without wheezing or rales. ABDOMEN: Soft and non-tender with normal pitched bowel sounds.  MUSCULOSKELETAL: There are no major deformities or cyanosis. NEUROLOGIC: No focal weakness or paresthesias are detected. SKIN: there is a indurated area over her right medial malleolus measuring approximately a centimeter in diameter. There is some mild surrounding cellulitis. PSYCHIATRIC: The patient has a normal affect.  DATA:  I independently interpreted her arterial Doppler study in our office today which shows ABIs of 100% bilaterally with triphasic Doppler signals in the dorsalis pedis and posterior tibial positions. He has no evidence of arterial insufficiency. I reviewed her duplex scan which was done in December.  MEDICAL ISSUES: I interrogated the area of concern with the ultrasound scanner myself in the office. I did not see any clot in the greater saphenous vein in this area however the vein at this site of inflammation was thickened consistent with phlebitis. It is certainly  possible that she had clot which is resolved and has some residual phlebitis explaining her symptoms and physical findings. Reassure her that she  has excellent arterial flow. I've encouraged her to elevate her leg after work and wear her compression stockings at work if she tolerates this. We'll also discussed the importance of using ibuprofen for pain as needed and warm compresses if needed. If the cellulitis worsens then consideration should be given to putting her back on antibiotics although this did not help significantly before. I'll be happy to see her back at any time if this does not resolve or if any new vascular issues arise.   Jasani Dolney S [facility]: [phone]

## 2011-04-28 ENCOUNTER — Encounter: Payer: Self-pay | Admitting: Internal Medicine

## 2011-04-28 ENCOUNTER — Ambulatory Visit (INDEPENDENT_AMBULATORY_CARE_PROVIDER_SITE_OTHER): Payer: Medicare Other | Admitting: Internal Medicine

## 2011-04-28 VITALS — BP 130/70 | HR 60 | Temp 98.3°F | Resp 16 | Ht 64.0 in | Wt 126.0 lb

## 2011-04-28 DIAGNOSIS — E785 Hyperlipidemia, unspecified: Secondary | ICD-10-CM | POA: Diagnosis not present

## 2011-04-28 DIAGNOSIS — I10 Essential (primary) hypertension: Secondary | ICD-10-CM

## 2011-04-28 DIAGNOSIS — D518 Other vitamin B12 deficiency anemias: Secondary | ICD-10-CM

## 2011-04-28 DIAGNOSIS — K219 Gastro-esophageal reflux disease without esophagitis: Secondary | ICD-10-CM | POA: Diagnosis not present

## 2011-04-28 DIAGNOSIS — E039 Hypothyroidism, unspecified: Secondary | ICD-10-CM | POA: Diagnosis not present

## 2011-04-28 DIAGNOSIS — D649 Anemia, unspecified: Secondary | ICD-10-CM

## 2011-04-28 DIAGNOSIS — I809 Phlebitis and thrombophlebitis of unspecified site: Secondary | ICD-10-CM

## 2011-04-28 DIAGNOSIS — D519 Vitamin B12 deficiency anemia, unspecified: Secondary | ICD-10-CM

## 2011-04-28 LAB — CBC WITH DIFFERENTIAL/PLATELET
Basophils Absolute: 0.1 10*3/uL (ref 0.0–0.1)
Basophils Relative: 0.8 % (ref 0.0–3.0)
Eosinophils Absolute: 0.2 10*3/uL (ref 0.0–0.7)
Eosinophils Relative: 2.2 % (ref 0.0–5.0)
HCT: 35 % — ABNORMAL LOW (ref 36.0–46.0)
Hemoglobin: 11.6 g/dL — ABNORMAL LOW (ref 12.0–15.0)
Lymphocytes Relative: 23.6 % (ref 12.0–46.0)
Lymphs Abs: 2.5 10*3/uL (ref 0.7–4.0)
MCHC: 33.1 g/dL (ref 30.0–36.0)
MCV: 99.4 fl (ref 78.0–100.0)
Monocytes Absolute: 0.8 10*3/uL (ref 0.1–1.0)
Monocytes Relative: 7.5 % (ref 3.0–12.0)
Neutro Abs: 6.9 10*3/uL (ref 1.4–7.7)
Neutrophils Relative %: 65.9 % (ref 43.0–77.0)
Platelets: 307 10*3/uL (ref 150.0–400.0)
RBC: 3.52 Mil/uL — ABNORMAL LOW (ref 3.87–5.11)
RDW: 13.7 % (ref 11.5–14.6)
WBC: 10.5 10*3/uL (ref 4.5–10.5)

## 2011-04-28 LAB — VITAMIN B12: Vitamin B-12: >1500 pg/mL — ABNORMAL HIGH (ref 211–911)

## 2011-04-28 LAB — T3, FREE: T3, Free: 2.5 pg/mL (ref 2.3–4.2)

## 2011-04-28 LAB — T4, FREE: Free T4: 0.92 ng/dL (ref 0.60–1.60)

## 2011-04-28 LAB — IRON: Iron: 56 ug/dL (ref 42–145)

## 2011-04-28 LAB — TSH: TSH: 0.57 u[IU]/mL (ref 0.35–5.50)

## 2011-04-28 MED ORDER — ASPIRIN BUFFERED 325 MG PO TABS: 325.0000 mg | ORAL_TABLET | Freq: Every day | ORAL | Status: AC

## 2011-04-28 NOTE — Patient Instructions (Signed)
Continue to wear the compression stockings try to apply heat to the side at least twice a day for around 15 minutes James the 81 mg aspirin to 325 for a whole coated aspirin

## 2011-04-28 NOTE — Progress Notes (Signed)
Subjective:    Patient ID: Rebecca [name], female    DOB: 1932/05/25, 78 y.o.   MRN: [id number]  HPI  The patient is a 78 year old female who is followed for thrombophlebitis of her right lateral ankle. She is a complete workup from vascular surgery looking at her arterial supply which has been assessed to be normal he concurred that she has superficial thrombophlebitis and that our treatment was appropriate.  The erythema is significantly improved though she still does have areas of erythema and pain above the ankle she continues to wear the compression stockings and states that she has been using ice at the site.  Review of Systems  Constitutional: Negative for activity change, appetite change and fatigue.  HENT: Negative for ear pain, congestion, neck pain, postnasal drip and sinus pressure.   Eyes: Negative for redness and visual disturbance.  Respiratory: Negative for cough, shortness of breath and wheezing.   Gastrointestinal: Negative for abdominal pain and abdominal distention.  Genitourinary: Negative for dysuria, frequency and menstrual problem.  Musculoskeletal: Negative for myalgias, joint swelling and arthralgias.  Skin: Negative for rash and wound.  Neurological: Negative for dizziness, weakness and headaches.  Hematological: Negative for adenopathy. Does not bruise/bleed easily.  Psychiatric/Behavioral: Negative for sleep disturbance and decreased concentration.   Past Medical History  Diagnosis Date  . Hypertension   . Diverticulosis   . Esophageal stricture   . FH: colon polyps   . Anxiety   . Hemorrhoids   . Gastroparesis   . Anemia   . Hyperlipidemia     History   Social History  . Marital Status: Widowed    Spouse Name: N/A    Number of Children: N/A  . Years of Education: N/A   Occupational History  . Not on file.   Social History Main Topics  . Smoking status: Never Smoker   . Smokeless tobacco: Not on file  . Alcohol Use: No  . Drug Use:  No  . Sexually Active: Yes   Other Topics Concern  . Not on file   Social History Narrative  . No narrative on file    Past Surgical History  Procedure Date  . Tonsillectomy   . Appendectomy   . Arcuate keratectomy   . Knee surgery bilateral   . Hernia repair   . Double pallital tori bone removed    . Morton's neuroma on rt foot and platar facial release   . Mouth surgery     Family History  Problem Relation Age of Onset  . Crohn's disease    . Migraines    . Colon polyps Sister   . Cancer Sister   . Colon polyps Brother   . Diabetes Brother   . Hyperlipidemia Brother   . Hypertension Brother   . Colon polyps Daughter   . Cancer Daughter   . Stroke Daughter   . Cancer Maternal Aunt     colon  . Diabetes Mother   . Heart disease Mother   . Hyperlipidemia Mother   . Hypertension Mother   . Stroke Mother   . Anuerysm Father   . Heart disease Father   . Hypertension Father   . Cancer Paternal Grandmother     colon    Allergies  Allergen Reactions  . Ciprofloxacin     REACTION: Thrush  . Codeine   . Pneumococcal Vaccine Polyvalent     REACTION: RED AND RAISED RASH ON ARM  . Sulfamethoxazole W/Trimethoprim   . Tramadol Hcl   .  Adhesive (Tape) Rash    Current Outpatient Prescriptions on File Prior to Visit  Medication Sig Dispense Refill  . ALPRAZolam (XANAX) 0.25 MG tablet take 1 tablet by mouth once daily if needed  30 tablet  3  . Calcium Carbonate-Vitamin D (CALCIUM-VITAMIN D) 500-200 MG-UNIT per tablet Take 1 tablet by mouth 2 (two) times daily with a meal.        . Cholecalciferol (VITAMIN D3) 2000 UNITS TABS Take by mouth daily.        [name] Kitchen ezetimibe-simvastatin (VYTORIN) 10-20 MG per tablet Take 1 tablet by mouth at bedtime.        [name] Kitchen FERREX 150 150 MG capsule take 1 capsule by mouth once daily  30 capsule  6  . gabapentin (NEURONTIN) 100 MG capsule Take 100 mg by mouth at bedtime.        . gabapentin (NEURONTIN) 100 MG capsule TAKE 1 CAPSULE BY MOUTH  AT BEDTIME IF NEEDED  30 capsule  2  . metoCLOPramide (REGLAN) 5 MG tablet Take 1 tablet (5 mg total) by mouth at bedtime and may repeat dose one time if needed.  90 tablet  6  . NEXIUM 40 MG capsule take 1 capsule by mouth once daily  30 capsule  1  . olmesartan (BENICAR) 20 MG tablet Take 20 mg by mouth daily.        [name] Kitchen omeprazole (PRILOSEC) 20 MG capsule Take 40 mg by mouth daily.        . predniSONE (DELTASONE) 10 MG tablet take 1 tablet by mouth once daily  30 tablet  1  . Probiotic Product (ALIGN PO) Take by mouth daily.        [name] Kitchen SYNTHROID 88 MCG tablet take 1 tablet by mouth once daily  30 tablet  9  . vitamin B-12 (CYANOCOBALAMIN) 500 MCG tablet Take 500 mcg by mouth daily.        . VOLTAREN 1 % GEL use as directed  100 g  1    BP 130/70  Pulse 60  Temp 98.3 F (36.8 C)  Resp 16  Ht 5\' 4"  (1.626 m)  Wt 126 lb (57.153 kg)  BMI 21.63 kg/m2       Objective:   Physical Exam  Nursing note and vitals reviewed. Constitutional: She is oriented to person, place, and time. She appears well-developed and well-nourished. No distress.  HENT:  Head: Normocephalic and atraumatic.  Right Ear: External ear normal.  Left Ear: External ear normal.  Nose: Nose normal.  Mouth/Throat: Oropharynx is clear and moist.  Eyes: Conjunctivae and EOM are normal. Pupils are equal, round, and reactive to light.  Neck: Normal range of motion. Neck supple. No JVD present. No tracheal deviation present. No thyromegaly present.  Cardiovascular: Normal rate, regular rhythm, normal heart sounds and intact distal pulses.   No murmur heard. Pulmonary/Chest: Effort normal and breath sounds normal. She has no wheezes. She exhibits no tenderness.  Abdominal: Soft. Bowel sounds are normal.  Musculoskeletal: Normal range of motion. She exhibits no edema and no tenderness.  Lymphadenopathy:    She has no cervical adenopathy.  Neurological: She is alert and oriented to person, place, and time. She has normal  reflexes. No cranial nerve deficit.  Skin: Skin is warm and dry. She is not diaphoretic.  Psychiatric: She has a normal mood and affect. Her behavior is normal.          Assessment & Plan:  Persistent superficial thrombophlebitis.  We discussed with the  patient that she should stop using ice at the site and only use heat and modest amounts for short periods of time she should continue to use the compression stockings and will increase her aspirin from 81 mg to 325 mg by mouth daily no other changes in her medications are indicated at this time we encouraged her to be active with walking and exercise.  Other problems on her problem list are stable including her history of anemia hypothyroidism and hyperlipidemia

## 2011-05-12 DIAGNOSIS — L821 Other seborrheic keratosis: Secondary | ICD-10-CM | POA: Diagnosis not present

## 2011-05-12 DIAGNOSIS — M793 Panniculitis, unspecified: Secondary | ICD-10-CM | POA: Diagnosis not present

## 2011-05-13 ENCOUNTER — Telehealth: Payer: Self-pay | Admitting: *Deleted

## 2011-05-13 NOTE — Telephone Encounter (Signed)
Pt would like lab results from 2 weeks ago , please.

## 2011-05-13 NOTE — Telephone Encounter (Signed)
Lab results called to pt -no iv fe now ,but stay on po fe per dr Lovell Sheehan

## 2011-05-20 ENCOUNTER — Encounter: Payer: Medicare Other | Admitting: Vascular Surgery

## 2011-05-20 ENCOUNTER — Other Ambulatory Visit: Payer: Medicare Other

## 2011-05-24 ENCOUNTER — Telehealth: Payer: Self-pay | Admitting: *Deleted

## 2011-05-24 MED ORDER — DOXYCYCLINE HYCLATE 100 MG PO TABS: 100.0000 mg | ORAL_TABLET | Freq: Two times a day (BID) | ORAL | Status: AC

## 2011-05-24 NOTE — Telephone Encounter (Signed)
Per dr Lovell Sheehan- has chronic phlebitis- can try doxycycline 100 bid for 3 weeks and dr Lovell Sheehan said she needs to go back to vein specilaist

## 2011-05-24 NOTE — Telephone Encounter (Signed)
Pt informed and pt informed to go back to dr Edilia Bo and meds will be called in

## 2011-05-24 NOTE — Telephone Encounter (Signed)
Pt's ankle is still extremely sore and cannot touch it, redness going up leg.  Using compression stockings, but does not help.   She has been having problems since January, and needs help.

## 2011-06-01 ENCOUNTER — Other Ambulatory Visit: Payer: Self-pay | Admitting: Internal Medicine

## 2011-06-29 ENCOUNTER — Other Ambulatory Visit: Payer: Self-pay | Admitting: Internal Medicine

## 2011-07-12 ENCOUNTER — Other Ambulatory Visit: Payer: Self-pay | Admitting: Internal Medicine

## 2011-07-21 ENCOUNTER — Telehealth: Payer: Self-pay | Admitting: Vascular Surgery

## 2011-07-21 NOTE — Telephone Encounter (Signed)
Patient called our office stating that Dr Lovell Sheehan wanted her to see [facility] again regarding her ankle pain. She has seen [facility] in the past for the same issues and per [facility]SDs notes, her ultrasounds were negative, but he would be happy to see her back if problems arise. [facility]SD is on vacation wk of 07/25/11, so I offered the patient 08/03/11 (the following week) at 11:15. Pt stated that she had a mammogram scheduled that day @ 9:00 so that would not work. I then offered her his next available 08/10/11- the patient was unhappy that would make her wait 3 whole weeks again. I explained to the patient that I had offered her something in 2 weeks, but she decided against it, and if she felt that she needed to be seen ASAP, she may need to r/s her mammogram. Pt decided in the end to schedule 3 weeks out on 08/10/11. FYI, dpm

## 2011-07-27 DIAGNOSIS — H02059 Trichiasis without entropian unspecified eye, unspecified eyelid: Secondary | ICD-10-CM | POA: Diagnosis not present

## 2011-07-28 ENCOUNTER — Encounter: Payer: Self-pay | Admitting: Internal Medicine

## 2011-07-28 ENCOUNTER — Ambulatory Visit (INDEPENDENT_AMBULATORY_CARE_PROVIDER_SITE_OTHER): Payer: Medicare Other | Admitting: Internal Medicine

## 2011-07-28 VITALS — BP 126/72 | HR 72 | Temp 98.0°F | Resp 16 | Ht 64.0 in | Wt 123.0 lb

## 2011-07-28 DIAGNOSIS — I8310 Varicose veins of unspecified lower extremity with inflammation: Secondary | ICD-10-CM | POA: Diagnosis not present

## 2011-07-28 DIAGNOSIS — I872 Venous insufficiency (chronic) (peripheral): Secondary | ICD-10-CM

## 2011-07-28 DIAGNOSIS — D649 Anemia, unspecified: Secondary | ICD-10-CM | POA: Diagnosis not present

## 2011-07-28 DIAGNOSIS — K3184 Gastroparesis: Secondary | ICD-10-CM | POA: Diagnosis not present

## 2011-07-28 DIAGNOSIS — I10 Essential (primary) hypertension: Secondary | ICD-10-CM

## 2011-07-28 DIAGNOSIS — M793 Panniculitis, unspecified: Secondary | ICD-10-CM | POA: Diagnosis not present

## 2011-07-28 DIAGNOSIS — E039 Hypothyroidism, unspecified: Secondary | ICD-10-CM | POA: Diagnosis not present

## 2011-07-28 LAB — CBC WITH DIFFERENTIAL/PLATELET
Basophils Absolute: 0.1 10*3/uL (ref 0.0–0.1)
Basophils Relative: 0.6 % (ref 0.0–3.0)
Eosinophils Absolute: 0.3 10*3/uL (ref 0.0–0.7)
Eosinophils Relative: 3.1 % (ref 0.0–5.0)
HCT: 35.8 % — ABNORMAL LOW (ref 36.0–46.0)
Hemoglobin: 12 g/dL (ref 12.0–15.0)
Lymphocytes Relative: 25.1 % (ref 12.0–46.0)
Lymphs Abs: 2.3 10*3/uL (ref 0.7–4.0)
MCHC: 33.6 g/dL (ref 30.0–36.0)
MCV: 96.5 fl (ref 78.0–100.0)
Monocytes Absolute: 1.1 10*3/uL — ABNORMAL HIGH (ref 0.1–1.0)
Monocytes Relative: 11.8 % (ref 3.0–12.0)
Neutro Abs: 5.5 10*3/uL (ref 1.4–7.7)
Neutrophils Relative %: 59.4 % (ref 43.0–77.0)
Platelets: 251 10*3/uL (ref 150.0–400.0)
RBC: 3.71 Mil/uL — ABNORMAL LOW (ref 3.87–5.11)
RDW: 12.8 % (ref 11.5–14.6)
WBC: 9.2 10*3/uL (ref 4.5–10.5)

## 2011-07-28 LAB — IRON: Iron: 49 ug/dL (ref 42–145)

## 2011-07-28 LAB — TSH: TSH: 1.59 u[IU]/mL (ref 0.35–5.50)

## 2011-07-28 MED ORDER — VALSARTAN 80 MG PO TABS: 80.0000 mg | ORAL_TABLET | Freq: Every day | ORAL | Status: DC

## 2011-07-28 MED ORDER — METOCLOPRAMIDE HCL 5 MG PO TABS: 5.0000 mg | ORAL_TABLET | Freq: Two times a day (BID) | ORAL | Status: DC | PRN

## 2011-07-28 NOTE — Patient Instructions (Signed)
The patient is instructed to continue all medications as prescribed. Schedule followup with check out clerk upon leaving the clinic  

## 2011-07-28 NOTE — Progress Notes (Signed)
Subjective:    Patient ID: Rebecca [name], female    DOB: [birth year], 78 y.o.   MRN: [id number]  HPI The patient presents for followup of her hypertension on Benicar she has excellent results she brings with her a list of home blood pressures which are all within range with the exception of one which was 144 diastolic was in range at 61 her average blood pressure appears to be in the mid 120s over about 60 which is an excellent blood pressure range for her she's tolerating the medications well.  Patient has taken doxycycline for cellulitis of her right ankle.  She had marked improvement while the drug since she has discontinued the drug she has noticed some recurrence of the phlebitis.  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   Review of Systems  Constitutional: Negative for activity change, appetite change and fatigue.  HENT: Negative for ear pain, congestion, neck pain, postnasal drip and sinus pressure.   Eyes: Negative for redness and visual disturbance.  Respiratory: Negative for cough, shortness of breath and wheezing.   Gastrointestinal: Negative for abdominal pain and abdominal distention.  Genitourinary: Negative for dysuria, frequency and menstrual problem.  Musculoskeletal: Negative for myalgias, joint swelling and arthralgias.  Skin: Negative for rash and wound.  Neurological: Negative for dizziness, weakness and headaches.  Hematological: Negative for adenopathy. Does not bruise/bleed easily.  Psychiatric/Behavioral: Negative for sleep disturbance and decreased concentration.       Objective:   Physical Exam  Nursing note  and vitals reviewed. Constitutional: She is oriented to person, place, and time. She appears well-developed and well-nourished. No distress.  HENT:  Head: Normocephalic and atraumatic.  Right Ear: External ear normal.  Left Ear: External ear normal.  Nose: Nose normal.  Mouth/Throat: Oropharynx is clear and moist.  Eyes: Conjunctivae and EOM are normal. Pupils are equal, round, and reactive to light.  Neck: Normal range of motion. Neck supple. No JVD present. No tracheal deviation present. No thyromegaly present.  Cardiovascular: Normal rate, regular rhythm, normal heart sounds and intact distal pulses.   No murmur heard. Pulmonary/Chest: Effort normal and breath sounds normal. She has no wheezes. She exhibits no tenderness.  Abdominal: Soft. Bowel sounds are normal.  Musculoskeletal: Normal range of motion. She exhibits no edema and no tenderness.  Lymphadenopathy:    She has no cervical adenopathy.  Neurological: She is alert and oriented to person, place, and time. She has normal reflexes. No cranial nerve deficit.  Skin: Skin is warm and dry. She is not diaphoretic.  Psychiatric: She has a normal mood and affect. Her behavior is normal.          Assessment & Plan:  Patient has an appointment with vascular surgeon to consider venous ablation therapy she continues to wear her compression stockings with good result although she does have periodic inflammation in the ankle region which has been labeled sclerosing panniculitis.  Her blood pressure stable her current medications examination of her skin reveals no erythema today and easily compressible veins  Chest significant GERD with gastroparesis she was placed on a very low dose of Reglan has tolerated it well we'll increase the Reglan to 5 mg before supper and before bedtime  and monitor if her symptoms persist an EGD would be recommended to look at her esophagus.  We will review this together at her next office visit

## 2011-08-01 ENCOUNTER — Telehealth: Payer: Self-pay | Admitting: *Deleted

## 2011-08-01 MED ORDER — LOSARTAN POTASSIUM 100 MG PO TABS: 100.0000 mg | ORAL_TABLET | Freq: Every day | ORAL | Status: DC

## 2011-08-01 NOTE — Telephone Encounter (Signed)
Per dr Lovell Sheehan- change to cozaar 100 qd

## 2011-08-01 NOTE — Telephone Encounter (Signed)
Pt informed and med sent in 

## 2011-08-01 NOTE — Telephone Encounter (Signed)
Pt is calling to let us know thej Diovan was not generic, and would like something less expensive.  Please call her and let her know, and is asking to talk to [facility] about this.

## 2011-08-03 DIAGNOSIS — Z1231 Encounter for screening mammogram for malignant neoplasm of breast: Secondary | ICD-10-CM | POA: Diagnosis not present

## 2011-08-05 ENCOUNTER — Encounter: Payer: Self-pay | Admitting: Internal Medicine

## 2011-08-06 ENCOUNTER — Other Ambulatory Visit: Payer: Self-pay | Admitting: Internal Medicine

## 2011-08-09 ENCOUNTER — Encounter: Payer: Self-pay | Admitting: Vascular Surgery

## 2011-08-10 ENCOUNTER — Ambulatory Visit: Payer: Medicare Other | Admitting: Vascular Surgery

## 2011-08-21 ENCOUNTER — Other Ambulatory Visit: Payer: Self-pay | Admitting: Internal Medicine

## 2011-08-22 DIAGNOSIS — R141 Gas pain: Secondary | ICD-10-CM | POA: Diagnosis not present

## 2011-08-22 DIAGNOSIS — L940 Localized scleroderma [morphea]: Secondary | ICD-10-CM | POA: Diagnosis not present

## 2011-08-22 DIAGNOSIS — R143 Flatulence: Secondary | ICD-10-CM | POA: Diagnosis not present

## 2011-08-22 DIAGNOSIS — Z124 Encounter for screening for malignant neoplasm of cervix: Secondary | ICD-10-CM | POA: Diagnosis not present

## 2011-08-22 DIAGNOSIS — M810 Age-related osteoporosis without current pathological fracture: Secondary | ICD-10-CM | POA: Diagnosis not present

## 2011-08-27 ENCOUNTER — Other Ambulatory Visit: Payer: Self-pay | Admitting: Internal Medicine

## 2011-08-30 ENCOUNTER — Encounter: Payer: Self-pay | Admitting: Vascular Surgery

## 2011-08-31 ENCOUNTER — Ambulatory Visit
Admission: RE | Admit: 2011-08-31 | Discharge: 2011-08-31 | Disposition: A | Discharge status: Home / Self Care | Payer: Medicare Other | Source: Ambulatory Visit | Attending: Vascular Surgery | Admitting: Vascular Surgery

## 2011-08-31 ENCOUNTER — Encounter: Payer: Self-pay | Admitting: Vascular Surgery

## 2011-08-31 ENCOUNTER — Ambulatory Visit (INDEPENDENT_AMBULATORY_CARE_PROVIDER_SITE_OTHER): Payer: Medicare Other | Admitting: Vascular Surgery

## 2011-08-31 VITALS — BP 134/78 | HR 67 | Temp 97.9°F | Ht 63.0 in | Wt 124.0 lb

## 2011-08-31 DIAGNOSIS — I70219 Atherosclerosis of native arteries of extremities with intermittent claudication, unspecified extremity: Secondary | ICD-10-CM

## 2011-08-31 DIAGNOSIS — M79609 Pain in unspecified limb: Secondary | ICD-10-CM

## 2011-08-31 DIAGNOSIS — M25579 Pain in unspecified ankle and joints of unspecified foot: Secondary | ICD-10-CM | POA: Diagnosis not present

## 2011-08-31 NOTE — Progress Notes (Signed)
Addended by: Sharee Pimple on: 08/31/2011 02:41 PM   Modules accepted: Orders

## 2011-08-31 NOTE — Progress Notes (Signed)
[facility]  Patient name: Rebecca [name] MRN: [id number] DOB: 05/05/1932 Sex: female  REASON FOR VISIT: reevaluate right ankle pain. Referred by Dr. Darryll Capers.  HPI: Rebecca [name] is a 78 y.o. female who I had originally seen with right ankle pain in January of this year. At that time she was noted to have a normal arterial Doppler study with palpable pedal pulses. I had and carried dated the area of concern in the office with the ultrasound scanner. I did not see any clot in the greater saphenous vein in this area however there was some thickening in the vein possibly consistent with a resolving phlebitis. I  Encouraged her to use warm compresses and elevate the leg as needed. She had some cellulitis in a discussed potentially restarting antibiotics if this worsened.  She states that she has had persistent pain in the right ankle. She had developed some erythema and cellulitis and was treated with 2 weeks of doxycycline which did help. However she still has some residual redness over the right ankle. She has moderate discomfort in this area.   REVIEW OF SYSTEMS: Arly.Keller ] denotes positive finding; [  ] denotes negative finding  CARDIOVASCULAR:  [ ]  chest pain   [ ]  dyspnea on exertion    CONSTITUTIONAL:  [ ]  fever   [ ]  chills  PHYSICAL EXAM: Filed Vitals:   08/31/11 1356  BP: 134/78  Pulse: 67  Temp: 97.9 F (36.6 C)  TempSrc: Oral  Height: 5\' 3"  (1.6 m)  Weight: 124 lb (56.246 kg)  SpO2: 98%   Body mass index is 21.97 kg/(m^2). GENERAL: The patient is a well-nourished female, in no acute distress. The vital signs are documented above. CARDIOVASCULAR: There is a regular rate and rhythm  PULMONARY: There is good air exchange bilaterally without wheezing or rales. She has palpable dorsalis pedis and posterior tibial pulses bilaterally. The saphenous vein overlying the right medial malleolus is soft without evidence of clot, thickening, or  induration to suggest phlebitis. There is some erythema posterior to the vein overlying the medial malleolus.  MEDICAL ISSUES: She had been told by her dermatologist that she had sclerosing panniculitis. I am not from failure with that term but in reviewing this I believe this is used to describe what vascular surgeons call lipodermatosclerosis which can be related to chronic venous insufficiency. However, she does not have significant induration or hyperpigmentation that she has significant chronic venous insufficiency. I think it is still possible that she has some resolving phlebitis at this level although currently on exam the saphenous vein is soft without evidence of active phlebitis. The only other consideration would be at or some type of a process involving the ankle and for this reason I have ordered a plain x-ray of her ankle 2 rule out osteomyelitis and have asked her to follow up with Dr. Darryll Capers. The only other consideration would be an MRI to further evaluate the ankle. Again I reassured her that she has excellent arterial flow. If this is a resolving phlebitis I reassured her that this is not something dangerous and at the treatment typically is elevation, warm compresses, ibuprofen, and antibiotics as needed.  Samiah Ricklefs S [facility] [place] Beeper: [phone]

## 2011-09-26 ENCOUNTER — Encounter: Payer: Self-pay | Admitting: Internal Medicine

## 2011-09-26 ENCOUNTER — Ambulatory Visit (INDEPENDENT_AMBULATORY_CARE_PROVIDER_SITE_OTHER): Payer: Medicare Other | Admitting: Internal Medicine

## 2011-09-26 VITALS — BP 120/70 | HR 72 | Temp 98.6°F | Resp 16 | Ht 63.0 in | Wt 112.0 lb

## 2011-09-26 DIAGNOSIS — K228 Other specified diseases of esophagus: Secondary | ICD-10-CM

## 2011-09-26 DIAGNOSIS — R141 Gas pain: Secondary | ICD-10-CM | POA: Diagnosis not present

## 2011-09-26 DIAGNOSIS — K2289 Other specified disease of esophagus: Secondary | ICD-10-CM | POA: Diagnosis not present

## 2011-09-26 DIAGNOSIS — R142 Eructation: Secondary | ICD-10-CM | POA: Diagnosis not present

## 2011-09-26 DIAGNOSIS — K219 Gastro-esophageal reflux disease without esophagitis: Secondary | ICD-10-CM | POA: Diagnosis not present

## 2011-09-26 DIAGNOSIS — R143 Flatulence: Secondary | ICD-10-CM | POA: Diagnosis not present

## 2011-09-26 DIAGNOSIS — R140 Abdominal distension (gaseous): Secondary | ICD-10-CM

## 2011-09-26 MED ORDER — DEXLANSOPRAZOLE 60 MG PO CPDR: 60.0000 mg | DELAYED_RELEASE_CAPSULE | Freq: Two times a day (BID) | ORAL | Status: DC

## 2011-09-26 NOTE — Patient Instructions (Signed)
Basically the diet and going to recommend you have something called a paleo diet The principles of a paleo  diet are gluten-free preservative-free and additive free The diet focuses on vegetables, lean protein sources, and appropriate carbohydrate sources No serial no bread no pasta No rice no beans no white potatoes

## 2011-09-26 NOTE — Progress Notes (Signed)
Subjective:    Patient ID: Rebecca [name], female    DOB: 08-11-1932, 78 y.o.   MRN: [id number]  HPI Has a history of gastroparesis and is experiencing severe gastroesophageal reflux with chronic hoarseness.  Occasionally she will feel solid reflux, most commonly her symptoms are esophageal burning. She has early satiety and loss of appetite. The patient's last EGD was 3 years ago this was done by Dr. Claudette Head. Currently she is on Nexium 40 mg once a day. Increased intestinal gas.     Review of Systems  Constitutional: Positive for fatigue. Negative for activity change and appetite change.  HENT: Positive for voice change. Negative for ear pain, congestion, neck pain, postnasal drip and sinus pressure.   Eyes: Negative for redness and visual disturbance.  Respiratory: Positive for cough and choking. Negative for shortness of breath and wheezing.   Gastrointestinal: Negative for abdominal pain and abdominal distention.  Genitourinary: Negative for dysuria, frequency and menstrual problem.  Musculoskeletal: Negative for myalgias, joint swelling and arthralgias.  Skin: Negative for rash and wound.  Neurological: Negative for dizziness, weakness and headaches.  Hematological: Negative for adenopathy. Does not bruise/bleed easily.  Psychiatric/Behavioral: Negative for disturbed wake/sleep cycle and decreased concentration.   Past Medical History  Diagnosis Date  . Hypertension   . Diverticulosis   . Esophageal stricture   . FH: colon polyps   . Anxiety   . Hemorrhoids   . Gastroparesis   . Anemia   . Hyperlipidemia     History   Social History  . Marital Status: Widowed    Spouse Name: N/A    Number of Children: N/A  . Years of Education: N/A   Occupational History  . Not on file.   Social History Main Topics  . Smoking status: Never Smoker   . Smokeless tobacco: Never Used  . Alcohol Use: No  . Drug Use: No  . Sexually Active: Yes   Other Topics  Concern  . Not on file   Social History Narrative  . No narrative on file    Past Surgical History  Procedure Date  . Tonsillectomy   . Appendectomy   . Arcuate keratectomy   . Knee surgery bilateral   . Hernia repair   . Double pallital tori bone removed    . Morton's neuroma on rt foot and platar facial release   . Mouth surgery     Family History  Problem Relation Age of Onset  . Crohn's disease    . Migraines    . Colon polyps Sister   . Cancer Sister   . Colon polyps Brother   . Diabetes Brother   . Hyperlipidemia Brother   . Hypertension Brother   . Colon polyps Daughter   . Cancer Daughter   . Stroke Daughter   . Cancer Maternal Aunt     colon  . Diabetes Mother   . Heart disease Mother   . Hyperlipidemia Mother   . Hypertension Mother   . Stroke Mother   . Anuerysm Father   . Heart disease Father   . Hypertension Father   . Cancer Paternal Grandmother     colon    Allergies  Allergen Reactions  . Ciprofloxacin     REACTION: Thrush  . Codeine   . Pneumococcal Vaccine Polyvalent     REACTION: RED AND RAISED RASH ON ARM  . Sulfamethoxazole W-Trimethoprim   . Tramadol Hcl   . Adhesive (Tape) Rash    Current Outpatient  Prescriptions on File Prior to Visit  Medication Sig Dispense Refill  . aspirin 325 MG buffered tablet Take 1 tablet (325 mg total) by mouth daily.      . Calcium Carbonate-Vitamin D (CALCIUM-VITAMIN D) 500-200 MG-UNIT per tablet Take 1 tablet by mouth 2 (two) times daily with a meal.        . Cholecalciferol (VITAMIN D3) 2000 UNITS TABS Take by mouth daily.        [name] Kitchen ezetimibe-simvastatin (VYTORIN) 10-20 MG per tablet Take 1 tablet by mouth at bedtime.        [name] Kitchen losartan (COZAAR) 100 MG tablet Take 1 tablet (100 mg total) by mouth daily.  90 tablet  3  . Probiotic Product (ALIGN PO) Take by mouth daily.        [name] Kitchen SYNTHROID 88 MCG tablet take 1 tablet by mouth once daily  30 tablet  9  . vitamin B-12 (CYANOCOBALAMIN) 500 MCG tablet  Take 500 mcg by mouth daily.        . VOLTAREN 1 % GEL use as directed  100 g  1  . FERREX 150 150 MG capsule take 1 capsule by mouth once daily  30 capsule  6  . gabapentin (NEURONTIN) 100 MG capsule TAKE 1 CAPSULE BY MOUTH AT BEDTIME IF NEEDED  30 capsule  2  . metoCLOPramide (REGLAN) 5 MG tablet Take 1 tablet (5 mg total) by mouth 3 (three) times daily. Before lunch before dinner and before bedtime  90 tablet  6  . DISCONTD: olmesartan (BENICAR) 20 MG tablet Take 20 mg by mouth daily.          BP 120/70  Pulse 72  Temp 98.6 F (37 C)  Resp 16  Ht 5\' 3"  (1.6 m)  Wt 112 lb (50.803 kg)  BMI 19.84 kg/m2       Objective:   Physical Exam  Vitals reviewed. Constitutional: She is oriented to person, place, and time. She appears well-developed and well-nourished. No distress.  HENT:  Head: Normocephalic and atraumatic.  Right Ear: External ear normal.  Left Ear: External ear normal.  Nose: Nose normal.  Mouth/Throat: Oropharynx is clear and moist.  Eyes: Conjunctivae normal and EOM are normal. Pupils are equal, round, and reactive to light.  Neck: Normal range of motion. Neck supple. No JVD present. No tracheal deviation present. No thyromegaly present.  Cardiovascular: Normal rate, regular rhythm, normal heart sounds and intact distal pulses.   No murmur heard. Pulmonary/Chest: Effort normal and breath sounds normal. She has no wheezes. She exhibits no tenderness.  Abdominal: Soft. Bowel sounds are normal.  Musculoskeletal: Normal range of motion. She exhibits no edema and no tenderness.  Lymphadenopathy:    She has no cervical adenopathy.  Neurological: She is alert and oriented to person, place, and time. She has normal reflexes. No cranial nerve deficit.  Skin: Skin is warm and dry. She is not diaphoretic.  Psychiatric: She has a normal mood and affect. Her behavior is normal.          Assessment & Plan:  Our initial step would be to put her on full acid suppression  with twice a day proton pump inhibitors.  We may change her proton pump inhibitor to a different proton pump inhibitor for a short period of time as a trial for effectiveness.  The second part of our intervention today will be dietary change I'm going to ask her to consider a gluten-free diet for a period of 6 weeks.

## 2011-09-29 ENCOUNTER — Encounter: Payer: Self-pay | Admitting: Internal Medicine

## 2011-09-29 ENCOUNTER — Telehealth: Payer: Self-pay | Admitting: Internal Medicine

## 2011-09-29 ENCOUNTER — Ambulatory Visit (INDEPENDENT_AMBULATORY_CARE_PROVIDER_SITE_OTHER): Payer: Medicare Other | Admitting: Internal Medicine

## 2011-09-29 VITALS — BP 130/66 | HR 58 | Temp 97.8°F | Wt 125.0 lb

## 2011-09-29 DIAGNOSIS — N390 Urinary tract infection, site not specified: Secondary | ICD-10-CM | POA: Diagnosis not present

## 2011-09-29 DIAGNOSIS — R109 Unspecified abdominal pain: Secondary | ICD-10-CM | POA: Diagnosis not present

## 2011-09-29 LAB — POCT URINALYSIS DIPSTICK
Bilirubin, UA: NEGATIVE
Glucose, UA: NEGATIVE
Ketones, UA: NEGATIVE
Nitrite, UA: NEGATIVE
Protein, UA: NEGATIVE
Spec Grav, UA: 1.01
Urobilinogen, UA: 0.2
pH, UA: 6.5

## 2011-09-29 MED ORDER — NITROFURANTOIN MONOHYD MACRO 100 MG PO CAPS: 100.0000 mg | ORAL_CAPSULE | Freq: Two times a day (BID) | ORAL | Status: AC

## 2011-09-29 NOTE — Progress Notes (Signed)
  Subjective:    Patient ID: Rebecca [name], female    DOB: July 09, 1932, 78 y.o.   MRN: [id number]  HPI Patient comes in today for SDA for  new problem evaluation. OPt of Dr Lovell Sheehan PCP  Comes in today with possible UTI symptoms.  She awoke at about 2 AM with lower abdominal discomfort over her bladder area. No dysuria but this is a typical symptom that she gets when she gets a urinary infection. No fever chills or flank pain.  intolerant of sulfa and Cipro and Levaquin but cantake Macrobid and Augmentin.  Underlying gastroparesis GERD symptoms stable ;see last visit a few days ago.  Review of Systems No fever chills hematuria change in her bowel habits. No incontinence or dribbling.   Past history family history social history reviewed in the electronic medical record. meds reviewed     Objective:   Physical Exam BP 130/66  Pulse 58  Temp 97.8 F (36.6 C) (Oral)  Wt 125 lb (56.7 kg)  SpO2 98% Well-developed alert cognitively intact nontoxic in no acute distress. She is ambulatory. Negative CVA tenderness. Abdomen:  Sof,t normal bowel sounds without hepatosplenomegaly, no guarding rebound or masses she has some mild discomfort in the suprapubic area. No psoas sign. UA shows 3+ leukocytes and a trace of blood. Sent for culture    Assessment & Plan:  Suprapubic pain with pyuria consistent with a UTI. History of the antibiotic intolerance. Culture sent and given Macrobid 100 twice a day for 7 days with caution she has tolerated this well before and it has been curative.

## 2011-09-29 NOTE — Patient Instructions (Addendum)
I agree that you probably have a bladder infection.  Treat with Macrobid because she tolerated this before.  Will inform me when we get the urine culture results back but if you're not getting better contact us.

## 2011-09-29 NOTE — Telephone Encounter (Signed)
Caller: Adyline/Patient; PCP: Darryll Capers; CB#: [phone]; ; ; Call regarding Urinary Pain/Bleeding;  Onset- 09/28/11 Afebrile. Pt has a Hx of UTI's. States she is pretty sure she has another one. She is having pelvic pain and that is usually her only s/s. Pt is calling for an appt today for evalution. No appts with Dr. Lovell Sheehan or Ventura Sellers. Appt scheduled with Dr. Fabian Sharp for 10:30am today.

## 2011-09-30 LAB — URINE CULTURE

## 2011-10-04 DIAGNOSIS — L821 Other seborrheic keratosis: Secondary | ICD-10-CM | POA: Diagnosis not present

## 2011-10-05 ENCOUNTER — Other Ambulatory Visit: Payer: Self-pay | Admitting: Internal Medicine

## 2011-10-07 ENCOUNTER — Telehealth: Payer: Self-pay | Admitting: Internal Medicine

## 2011-10-07 MED ORDER — FIRST-DUKES MOUTHWASH MT SUSP: 5.0000 mL | Freq: Three times a day (TID) | OROMUCOSAL | Status: DC

## 2011-10-07 NOTE — Telephone Encounter (Signed)
Spoke with patient and she is requesting a Rx for magic mouthwash

## 2011-10-07 NOTE — Telephone Encounter (Signed)
Recommend pt be seen.

## 2011-10-07 NOTE — Telephone Encounter (Signed)
aller: Madissen/Patient; PCP: Darryll Capers; CB#: [phone]; ; ; Call regarding Feels Like She Has A. "fungus in the throat";   She was recently on anbx for UTI and took last pill yesterday 10/06/11. Triaged Sore Throat and Hoarsness.  All emergent SX R/O. Pt not answering questions easliy.  States it's "just that feeling that I get, i've had this before."   Pt cannot tell me if she has any white patches in her mouth or throat.   Disp = Provide Home care.  Home care and call back inst given.  PT WANTS SOMETHING CALL IN FOR THIS PLEASE.  Uses [facility] on [address].

## 2011-11-01 ENCOUNTER — Ambulatory Visit (INDEPENDENT_AMBULATORY_CARE_PROVIDER_SITE_OTHER): Payer: Medicare Other | Admitting: Internal Medicine

## 2011-11-01 ENCOUNTER — Encounter: Payer: Self-pay | Admitting: Internal Medicine

## 2011-11-01 VITALS — BP 120/70 | HR 60 | Temp 98.2°F | Resp 14 | Ht 63.0 in | Wt 120.0 lb

## 2011-11-01 DIAGNOSIS — I10 Essential (primary) hypertension: Secondary | ICD-10-CM | POA: Diagnosis not present

## 2011-11-01 DIAGNOSIS — E785 Hyperlipidemia, unspecified: Secondary | ICD-10-CM

## 2011-11-01 DIAGNOSIS — D649 Anemia, unspecified: Secondary | ICD-10-CM

## 2011-11-01 DIAGNOSIS — K2210 Ulcer of esophagus without bleeding: Secondary | ICD-10-CM

## 2011-11-01 DIAGNOSIS — K208 Other esophagitis without bleeding: Secondary | ICD-10-CM

## 2011-11-01 DIAGNOSIS — Z0000 Encounter for general adult medical examination without abnormal findings: Secondary | ICD-10-CM | POA: Diagnosis not present

## 2011-11-01 DIAGNOSIS — M48061 Spinal stenosis, lumbar region without neurogenic claudication: Secondary | ICD-10-CM

## 2011-11-01 DIAGNOSIS — IMO0002 Reserved for concepts with insufficient information to code with codable children: Secondary | ICD-10-CM

## 2011-11-01 DIAGNOSIS — K3184 Gastroparesis: Secondary | ICD-10-CM

## 2011-11-01 DIAGNOSIS — G63 Polyneuropathy in diseases classified elsewhere: Secondary | ICD-10-CM

## 2011-11-01 LAB — BASIC METABOLIC PANEL
BUN: 13 mg/dL (ref 6–23)
CO2: 27 mEq/L (ref 19–32)
Calcium: 9.9 mg/dL (ref 8.4–10.5)
Chloride: 100 mEq/L (ref 96–112)
Creatinine, Ser: 0.9 mg/dL (ref 0.4–1.2)
GFR: 66.28 mL/min (ref >60.00)
Glucose, Bld: 100 mg/dL — ABNORMAL HIGH (ref 70–99)
Potassium: 4.1 mEq/L (ref 3.5–5.1)
Sodium: 136 mEq/L (ref 135–145)

## 2011-11-01 LAB — CBC WITH DIFFERENTIAL/PLATELET
Basophils Absolute: 0.1 10*3/uL (ref 0.0–0.1)
Basophils Relative: 0.9 % (ref 0.0–3.0)
Eosinophils Absolute: 0.3 10*3/uL (ref 0.0–0.7)
Eosinophils Relative: 3.3 % (ref 0.0–5.0)
HCT: 35.6 % — ABNORMAL LOW (ref 36.0–46.0)
Hemoglobin: 11.8 g/dL — ABNORMAL LOW (ref 12.0–15.0)
Lymphocytes Relative: 25.9 % (ref 12.0–46.0)
Lymphs Abs: 2.2 10*3/uL (ref 0.7–4.0)
MCHC: 33.1 g/dL (ref 30.0–36.0)
MCV: 97.6 fl (ref 78.0–100.0)
Monocytes Absolute: 0.8 10*3/uL (ref 0.1–1.0)
Monocytes Relative: 9.2 % (ref 3.0–12.0)
Neutro Abs: 5.1 10*3/uL (ref 1.4–7.7)
Neutrophils Relative %: 60.7 % (ref 43.0–77.0)
Platelets: 251 10*3/uL (ref 150.0–400.0)
RBC: 3.65 Mil/uL — ABNORMAL LOW (ref 3.87–5.11)
RDW: 13 % (ref 11.5–14.6)
WBC: 8.4 10*3/uL (ref 4.5–10.5)

## 2011-11-01 LAB — LIPID PANEL
Cholesterol: 160 mg/dL (ref 0–200)
HDL: 49.6 mg/dL (ref >39.00)
LDL Cholesterol: 82 mg/dL (ref 0–99)
Total CHOL/HDL Ratio: 3
Triglycerides: 143 mg/dL (ref 0.0–149.0)
VLDL: 28.6 mg/dL (ref 0.0–40.0)

## 2011-11-01 LAB — POCT URINALYSIS DIPSTICK
Bilirubin, UA: NEGATIVE
Blood, UA: NEGATIVE
Glucose, UA: NEGATIVE
Ketones, UA: NEGATIVE
Nitrite, UA: POSITIVE
Protein, UA: NEGATIVE
Spec Grav, UA: 1.01
Urobilinogen, UA: 0.2
pH, UA: 7

## 2011-11-01 LAB — HEPATIC FUNCTION PANEL
ALT: 15 U/L (ref 0–35)
AST: 17 U/L (ref 0–37)
Albumin: 3.6 g/dL (ref 3.5–5.2)
Alkaline Phosphatase: 93 U/L (ref 39–117)
Bilirubin, Direct: 0 mg/dL (ref 0.0–0.3)
Total Bilirubin: 0.3 mg/dL (ref 0.3–1.2)
Total Protein: 6.7 g/dL (ref 6.0–8.3)

## 2011-11-01 LAB — TSH: TSH: 1.13 u[IU]/mL (ref 0.35–5.50)

## 2011-11-01 LAB — IRON: Iron: 54 ug/dL (ref 42–145)

## 2011-11-01 MED ORDER — ESOMEPRAZOLE MAGNESIUM 40 MG PO CPDR: 40.0000 mg | DELAYED_RELEASE_CAPSULE | Freq: Two times a day (BID) | ORAL | Status: DC

## 2011-11-01 MED ORDER — METOCLOPRAMIDE HCL 5 MG PO TABS: 5.0000 mg | ORAL_TABLET | Freq: Three times a day (TID) | ORAL | Status: DC

## 2011-11-01 NOTE — Progress Notes (Signed)
Subjective:    Patient ID: Rebecca [name], female    DOB: 1933/03/01, 78 y.o.   MRN: [id number]  HPI Chronic back pain. The patient has gastroparesis and GERD ( symptomatic with hoarseness) Increased dexilant to BID with some improvement. Takes reglan 5 at dinner and bed time. 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   Review of Systems  Constitutional: Positive for fatigue. Negative for activity change and appetite change.  HENT: Positive for ear pain, congestion and postnasal drip. Negative for neck pain and sinus pressure.   Eyes: Negative for redness and visual disturbance.  Respiratory: Positive for cough. Negative for shortness of breath and wheezing.        Hoarse  Gastrointestinal: Negative for abdominal pain and abdominal distention.  Genitourinary: Negative for dysuria, frequency and menstrual problem.  Musculoskeletal: Negative for myalgias, joint swelling and arthralgias.  Skin: Negative for rash and wound.  Neurological: Negative for dizziness, weakness and headaches.  Hematological: Negative for adenopathy. Does not bruise/bleed easily.  Psychiatric/Behavioral: Negative for disturbed wake/sleep cycle and decreased concentration.   Past Medical History  Diagnosis Date  . Hypertension   . Diverticulosis   . Esophageal stricture   . FH: colon polyps   . Anxiety   . Hemorrhoids   . Gastroparesis   . Anemia   . Hyperlipidemia     History   Social History  . Marital Status: Widowed    Spouse Name: N/A    Number of Children: N/A  . Years of Education: N/A   Occupational History  . Not on file.   Social History Main Topics  . Smoking status: Never Smoker   . Smokeless tobacco: Never Used  . Alcohol Use: No  . Drug Use: No  . Sexually Active: Yes   Other Topics Concern  . Not on file   Social History Narrative  . No narrative on file    Past Surgical History  Procedure Date  . Tonsillectomy   . Appendectomy   . Arcuate  keratectomy   . Knee surgery bilateral   . Hernia repair   . Double pallital tori bone removed    . Morton's neuroma on rt foot and platar facial release   . Mouth surgery     Family History  Problem Relation Age of Onset  . Crohn's disease    . Migraines    . Colon polyps Sister   . Cancer Sister   . Colon polyps Brother   . Diabetes Brother   . Hyperlipidemia Brother   . Hypertension Brother   . Colon polyps Daughter   . Cancer Daughter   . Stroke Daughter   . Cancer Maternal Aunt     colon  . Diabetes Mother   . Heart disease Mother   . Hyperlipidemia Mother   . Hypertension Mother   . Stroke Mother   . Anuerysm Father   . Heart disease Father   . Hypertension Father   . Cancer Paternal Grandmother     colon    Allergies  Allergen Reactions  . Ciprofloxacin     REACTION: Thrush  . Codeine   . Pneumococcal Vaccine Polyvalent     REACTION: RED AND RAISED RASH ON ARM  . Sulfamethoxazole W-Trimethoprim   . Tramadol Hcl   . Adhesive (Tape) Rash    Current Outpatient Prescriptions on File Prior to Visit  Medication Sig Dispense Refill  . ALPRAZolam (XANAX) 0.25 MG tablet take 1 tablet by  mouth once daily if needed  30 tablet  2  . aspirin 325 MG buffered tablet Take 1 tablet (325 mg total) by mouth daily.      . Calcium Carbonate-Vitamin D (CALCIUM-VITAMIN D) 500-200 MG-UNIT per tablet Take 1 tablet by mouth 2 (two) times daily with a meal.        . Cholecalciferol (VITAMIN D3) 2000 UNITS TABS Take by mouth daily.        . Diphenhyd-Hydrocort-Nystatin (FIRST-DUKES MOUTHWASH) SUSP Use as directed 5 mLs in the mouth or throat 3 (three) times daily.  237 mL  0  . ezetimibe-simvastatin (VYTORIN) 10-20 MG per tablet Take 1 tablet by mouth at bedtime.        [name] Kitchen FERREX 150 150 MG capsule take 1 capsule by mouth once daily  30 capsule  6  . gabapentin (NEURONTIN) 100 MG capsule TAKE 1 CAPSULE BY MOUTH AT BEDTIME IF NEEDED  30 capsule  2  . losartan (COZAAR) 100 MG tablet  Take 1 tablet (100 mg total) by mouth daily.  90 tablet  3  . metoCLOPramide (REGLAN) 5 MG tablet Take 1 tablet (5 mg total) by mouth 3 times/day as needed-between meals & bedtime.  90 tablet  6  . Probiotic Product (ALIGN PO) Take by mouth daily.        [name] Kitchen SYNTHROID 88 MCG tablet take 1 tablet by mouth once daily  30 tablet  9  . vitamin B-12 (CYANOCOBALAMIN) 500 MCG tablet Take 500 mcg by mouth daily.        . VOLTAREN 1 % GEL use as directed  100 g  1  . DISCONTD: dexlansoprazole (DEXILANT) 60 MG capsule Take 1 capsule (60 mg total) by mouth 2 (two) times daily.      [name] Kitchen DISCONTD: gabapentin (NEURONTIN) 100 MG capsule Take 100 mg by mouth at bedtime.        [name] Kitchen DISCONTD: olmesartan (BENICAR) 20 MG tablet Take 20 mg by mouth daily.          BP 120/70  Pulse 60  Temp 98.2 F (36.8 C)  Resp 14  Ht 5\' 3"  (1.6 m)  Wt 120 lb (54.432 kg)  BMI 21.26 kg/m2       Objective:   Physical Exam  Nursing note and vitals reviewed. Constitutional: She appears well-developed and well-nourished.  HENT:  Head: Normocephalic and atraumatic.  Eyes: Conjunctivae are normal. Pupils are equal, round, and reactive to light.  Neck: Normal range of motion. Neck supple.  Pulmonary/Chest:       hoarse  Abdominal: Soft. Bowel sounds are normal.  Musculoskeletal: Normal range of motion.  Skin: Skin is warm and dry.  Psychiatric: She has a normal mood and affect. Her behavior is normal.          Assessment & Plan:  Is primary complaint today revolves around her gastroparesis and gastroesophageal reflux she has significant gastroesophageal reflux it improves slightly with the increase of the proton pump inhibitor to twice daily but she still has persistent hoarseness that she fears is related to reflux.  She has had esophageal dilation in the past for esophageal stricture development that was related to reflux.  I believe we can safely titrate the Reglan with a careful discussion of potential side effects  25 mg 3 times a day with the 2 larger meals and before bedtime and see if this will affect some of her reflux symptoms I believe that she needs to continue on a proton pump inhibitor  twice daily will check with her formulary to see which one would be cost effective for long-term use.  Rest of examination is a Medicare wellness Subjective:    Rebecca [name] is a 78 y.o. female who presents for Medicare Annual/Subsequent preventive examination.  Preventive Screening-Counseling & Management  Tobacco History  Smoking status  . Never Smoker   Smokeless tobacco  . Never Used     Problems Prior to Visit 1.   Current Problems (verified) Patient Active Problem List  Diagnosis  . ADENOMATOUS COLONIC POLYP  . HYPOTHYROIDISM, PRIMARY  . HYPERLIPIDEMIA  . ANEMIA, IRON DEFICIENCY  . MIGRAINE, OPHTHALMIC  . POLYNEUROPATHY OTHER DISEASES CLASSIFIED ELSW  . HYPERTENSION  . CAROTID ARTERY DISEASE  . UNSPECIFIED PERIPHERAL VASCULAR DISEASE  . ALLERGIC RHINITIS CAUSE UNSPECIFIED  . GERD  . GASTROPARESIS  . DIVERTICULOSIS, COLON  . SPINAL STENOSIS, LUMBAR  . LUMBAR RADICULOPATHY, ATYPICAL  . GANGLION OF TENDON SHEATH  . OTHER OSTEOPOROSIS  . SCOLIOSIS, LUMBAR SPINE  . DYSPHAGIA  . ADVEF, DRUG/MEDICINAL/BIOLOGICAL SUBST NOS  . Atherosclerosis 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  . Pain in limb  . Sclerosing panniculitis  . UTI (lower urinary tract infection)  . Abdominal pain    Medications Prior to Visit Current Outpatient Prescriptions on File Prior to Visit  Medication Sig Dispense Refill  . ALPRAZolam (XANAX) 0.25 MG tablet take 1 tablet by mouth once daily if needed  30 tablet  2  . aspirin 325 MG buffered tablet Take 1 tablet (325 mg total) by mouth daily.      . Calcium Carbonate-Vitamin D (CALCIUM-VITAMIN D) 500-200 MG-UNIT per tablet Take 1 tablet by mouth 2 (two) times daily with a meal.        . Cholecalciferol (VITAMIN D3) 2000 UNITS  TABS Take by mouth daily.        . Diphenhyd-Hydrocort-Nystatin (FIRST-DUKES MOUTHWASH) SUSP Use as directed 5 mLs in the mouth or throat 3 (three) times daily.  237 mL  0  . ezetimibe-simvastatin (VYTORIN) 10-20 MG per tablet Take 1 tablet by mouth at bedtime.        [name] Kitchen FERREX 150 150 MG capsule take 1 capsule by mouth once daily  30 capsule  6  . gabapentin (NEURONTIN) 100 MG capsule TAKE 1 CAPSULE BY MOUTH AT BEDTIME IF NEEDED  30 capsule  2  . losartan (COZAAR) 100 MG tablet Take 1 tablet (100 mg total) by mouth daily.  90 tablet  3  . Probiotic Product (ALIGN PO) Take by mouth daily.        [name] Kitchen SYNTHROID 88 MCG tablet take 1 tablet by mouth once daily  30 tablet  9  . vitamin B-12 (CYANOCOBALAMIN) 500 MCG tablet Take 500 mcg by mouth daily.        . VOLTAREN 1 % GEL use as directed  100 g  1  . DISCONTD: metoCLOPramide (REGLAN) 5 MG tablet Take 1 tablet (5 mg total) by mouth 3 times/day as needed-between meals & bedtime.  90 tablet  6  . DISCONTD: gabapentin (NEURONTIN) 100 MG capsule Take 100 mg by mouth at bedtime.        [name] Kitchen DISCONTD: olmesartan (BENICAR) 20 MG tablet Take 20 mg by mouth daily.          Current Medications (verified) Current Outpatient Prescriptions  Medication Sig Dispense Refill  . ALPRAZolam (XANAX) 0.25 MG tablet take 1 tablet by mouth once daily if needed  30 tablet  2  .  aspirin 325 MG buffered tablet Take 1 tablet (325 mg total) by mouth daily.      . Calcium Carbonate-Vitamin D (CALCIUM-VITAMIN D) 500-200 MG-UNIT per tablet Take 1 tablet by mouth 2 (two) times daily with a meal.        . Cholecalciferol (VITAMIN D3) 2000 UNITS TABS Take by mouth daily.        . Diphenhyd-Hydrocort-Nystatin (FIRST-DUKES MOUTHWASH) SUSP Use as directed 5 mLs in the mouth or throat 3 (three) times daily.  237 mL  0  . ezetimibe-simvastatin (VYTORIN) 10-20 MG per tablet Take 1 tablet by mouth at bedtime.        [name] Kitchen FERREX 150 150 MG capsule take 1 capsule by mouth once daily  30 capsule   6  . gabapentin (NEURONTIN) 100 MG capsule TAKE 1 CAPSULE BY MOUTH AT BEDTIME IF NEEDED  30 capsule  2  . losartan (COZAAR) 100 MG tablet Take 1 tablet (100 mg total) by mouth daily.  90 tablet  3  . metoCLOPramide (REGLAN) 5 MG tablet Take 1 tablet (5 mg total) by mouth 3 (three) times daily. Before lunch before dinner and before bedtime  90 tablet  6  . Probiotic Product (ALIGN PO) Take by mouth daily.        [name] Kitchen SYNTHROID 88 MCG tablet take 1 tablet by mouth once daily  30 tablet  9  . vitamin B-12 (CYANOCOBALAMIN) 500 MCG tablet Take 500 mcg by mouth daily.        . VOLTAREN 1 % GEL use as directed  100 g  1  . DISCONTD: metoCLOPramide (REGLAN) 5 MG tablet Take 1 tablet (5 mg total) by mouth 3 times/day as needed-between meals & bedtime.  90 tablet  6  . esomeprazole (NEXIUM) 40 MG capsule Take 1 capsule (40 mg total) by mouth 2 (two) times daily.  60 capsule  11  . DISCONTD: gabapentin (NEURONTIN) 100 MG capsule Take 100 mg by mouth at bedtime.        [name] Kitchen DISCONTD: olmesartan (BENICAR) 20 MG tablet Take 20 mg by mouth daily.           Allergies (verified) Ciprofloxacin; Codeine; Pneumococcal vaccine polyvalent; Sulfamethoxazole w-trimethoprim; Tramadol hcl; and Adhesive   PAST HISTORY  Family History Family History  Problem Relation Age of Onset  . Crohn's disease    . Migraines    . Colon polyps Sister   . Cancer Sister   . Colon polyps Brother   . Diabetes Brother   . Hyperlipidemia Brother   . Hypertension Brother   . Colon polyps Daughter   . Cancer Daughter   . Stroke Daughter   . Cancer Maternal Aunt     colon  . Diabetes Mother   . Heart disease Mother   . Hyperlipidemia Mother   . Hypertension Mother   . Stroke Mother   . Anuerysm Father   . Heart disease Father   . Hypertension Father   . Cancer Paternal Grandmother     colon    Social History History  Substance Use Topics  . Smoking status: Never Smoker   . Smokeless tobacco: Never Used  . Alcohol Use:  No     Are there smokers in your home (other than you)? No  Risk Factors Current exercise habits: The patient does not participate in regular exercise at present.  Dietary issues discussed: none   Cardiac risk factors: advanced age (older than 27 for men, 98 for women) and sedentary lifestyle.  Depression Screen (Note: if answer to either of the following is "Yes", a more complete depression screening is indicated)   Over the past two weeks, have you felt down, depressed or hopeless? No  Over the past two weeks, have you felt little interest or pleasure in doing things? No  Have you lost interest or pleasure in daily life? No  Do you often feel hopeless? No  Do you cry easily over simple problems? No  Activities of Daily Living In your present state of health, do you have any difficulty performing the following activities?:  Driving? No Managing money?  No Feeding yourself? No Getting from bed to chair? No Climbing a flight of stairs? No Preparing food and eating?: No Bathing or showering? No Getting dressed: No Getting to the toilet? No Using the toilet:No Moving around from place to place: No In the past year have you fallen or had a near fall?:No   Are you sexually active?  No  Do you have more than one partner?  No  Hearing Difficulties: No Do you often ask people to speak up or repeat themselves? No Do you experience ringing or noises in your ears? No Do you have difficulty understanding soft or whispered voices? No   Do you feel that you have a problem with memory? No  Do you often misplace items? No  Do you feel safe at home?  No  Cognitive Testing  Alert? Yes  Normal Appearance?Yes  Oriented to person? Yes  Place? Yes   Time? Yes  Recall of three objects?  Yes  Can perform simple calculations? Yes  Displays appropriate judgment?Yes  Can read the correct time from a watch face?Yes   Advanced Directives have been discussed with the patient? Yes  List the  Names of Other Physician/Practitioners you currently use: 1.    Indicate any recent Medical Services you may have received from other than [facility] providers in the past year (date may be approximate).  Immunization History  Administered Date(s) Administered  . H1N1 06/02/2008  . Influenza Split 12/29/2010  . Influenza Whole 04/04/2001, 02/01/2007, 12/24/2007, 12/16/2008, 01/15/2009, 01/05/2010  . Pneumococcal Polysaccharide 03/05/1999  . Td 04/05/1999, 07/28/2008  . Zoster 10/24/2007    Screening Tests Health Maintenance  Topic Date Due  . Pneumococcal Polysaccharide Vaccine Age 43 And Over  02/20/2000  . Influenza Vaccine  01/03/2012  . Tetanus/tdap  07/29/2018  . Colonoscopy  05/13/2019  . Zostavax  Completed    All answers were reviewed with the patient and necessary referrals were made:  Carrie Mew, MD   11/01/2011   History reviewed: allergies, current medications, past family history, past medical history, past social history, past surgical history and problem list  Review of Systems A comprehensive review of systems was negative.    Objective:     Vision by Snellen chart: right eye:20/20, left eye:20/20 with glasses has eye exam in october  Body mass index is 21.26 kg/(m^2). BP 120/70  Pulse 60  Temp 98.2 F (36.8 C)  Resp 14  Ht 5\' 3"  (1.6 m)  Wt 120 lb (54.432 kg)  BMI 21.26 kg/m2  BP 120/70  Pulse 60  Temp 98.2 F (36.8 C)  Resp 14  Ht 5\' 3"  (1.6 m)  Wt 120 lb (54.432 kg)  BMI 21.26 kg/m2  General Appearance:    Alert, cooperative, no distress, appears stated age  Head:    Normocephalic, without obvious abnormality, atraumatic  Eyes:    PERRL, conjunctiva/corneas clear, EOM's intact,  fundi    benign, both eyes  Ears:    Normal TM's and external ear canals, both ears  Nose:   Nares normal, septum midline, mucosa normal, no drainage    or sinus tenderness  Throat:   Lips, mucosa, and tongue normal; teeth and gums normal  Neck:   Supple,  symmetrical, trachea midline, no adenopathy;    thyroid:  no enlargement/tenderness/nodules; no carotid   bruit or JVD  Back:     Symmetric, no curvature, ROM normal, no CVA tenderness  Lungs:     Clear to auscultation bilaterally, respirations unlabored  Chest Wall:    No tenderness or deformity   Heart:    Regular rate and rhythm, S1 and S2 normal, no murmur, rub   or gallop  Breast Exam:    No tenderness, masses, or nipple abnormality  Abdomen:     Soft, non-tender, bowel sounds active all four quadrants,    no masses, no organomegaly  Genitalia:    Normal female without lesion, discharge or tenderness  Rectal:    Normal tone, normal prostate, no masses or tenderness;   guaiac negative stool  Extremities:   Extremities normal, atraumatic, no cyanosis or edema  Pulses:   2+ and symmetric all extremities  Skin:   Skin color, texture, turgor normal, no rashes or lesions  Lymph nodes:   Cervical, supraclavicular, and axillary nodes normal  Neurologic:   CNII-XII intact, normal strength, sensation and reflexes    throughout       Assessment:      This is a routine physical examination for this healthy  Female. Reviewed all health maintenance protocols including mammography colonoscopy bone density and reviewed appropriate screening labs. Her immunization history was reviewed as well as her current medications and allergies refills of her chronic medications were given and the plan for yearly health maintenance was discussed all orders and referrals were made as appropriate.      Plan:     During the course of the visit the patient was educated and counseled about appropriate screening and preventive services including:    Influenza vaccine  Screening electrocardiogram  Screening mammography  Diet review for nutrition referral? Yes ____  Not Indicated ____   Patient Instructions (the written plan) was given to the patient.  Medicare Attestation I have personally  reviewed: The patient's medical and social history Their use of alcohol, tobacco or illicit drugs Their current medications and supplements The patient's functional ability including ADLs,fall risks, home safety risks, cognitive, and hearing and visual impairment Diet and physical activities Evidence for depression or mood disorders  The patient's weight, height, BMI, and visual acuity have been recorded in the chart.  I have made referrals, counseling, and provided education to the patient based on review of the above and I have provided the patient with a written personalized care plan for preventive services.     Carrie Mew, MD   11/01/2011

## 2011-11-01 NOTE — Patient Instructions (Signed)
We're increasing the Reglan to 1 with lunch one with dinner and one at bedtime we are changing to Nexium 40 mg twice daily

## 2011-11-05 ENCOUNTER — Other Ambulatory Visit: Payer: Self-pay | Admitting: Internal Medicine

## 2011-11-09 DIAGNOSIS — L538 Other specified erythematous conditions: Secondary | ICD-10-CM | POA: Diagnosis not present

## 2011-11-09 DIAGNOSIS — T148 Other injury of unspecified body region: Secondary | ICD-10-CM | POA: Diagnosis not present

## 2011-11-09 DIAGNOSIS — W57XXXA Bitten or stung by nonvenomous insect and other nonvenomous arthropods, initial encounter: Secondary | ICD-10-CM | POA: Diagnosis not present

## 2011-11-22 ENCOUNTER — Telehealth: Payer: Self-pay | Admitting: Internal Medicine

## 2011-11-22 NOTE — Telephone Encounter (Signed)
Caller: Stephani/Patient; Patient Name: Rebecca [name]; PCP: Darryll Capers; Best Callback Phone Number: [phone].  Patient calling today 11/22/11 regarding saw Dr. Lovell Sheehan on 11/01/11 for complete physical.  Patient had told nurse that she would like a copy of her blood work mailed to [address] [place] [phone]. She has not received this yet. OFFICE PLEASE CALL PATIENT BACK AT ABOVE CALL BACK NUMBER TO LET HER KNOW THIS HAS BEEN MAILED.

## 2011-11-22 NOTE — Telephone Encounter (Signed)
Done AND CALLED PT

## 2011-11-23 ENCOUNTER — Other Ambulatory Visit: Payer: Self-pay | Admitting: Dermatology

## 2011-11-23 DIAGNOSIS — W57XXXA Bitten or stung by nonvenomous insect and other nonvenomous arthropods, initial encounter: Secondary | ICD-10-CM | POA: Diagnosis not present

## 2011-11-23 DIAGNOSIS — T148 Other injury of unspecified body region: Secondary | ICD-10-CM | POA: Diagnosis not present

## 2011-11-23 DIAGNOSIS — L988 Other specified disorders of the skin and subcutaneous tissue: Secondary | ICD-10-CM | POA: Diagnosis not present

## 2011-11-23 DIAGNOSIS — L538 Other specified erythematous conditions: Secondary | ICD-10-CM | POA: Diagnosis not present

## 2011-12-05 ENCOUNTER — Other Ambulatory Visit: Payer: Self-pay | Admitting: Internal Medicine

## 2011-12-06 ENCOUNTER — Other Ambulatory Visit: Payer: Self-pay | Admitting: *Deleted

## 2011-12-06 MED ORDER — ALPRAZOLAM 0.25 MG PO TABS: 0.2500 mg | ORAL_TABLET | Freq: Every day | ORAL | Status: DC | PRN

## 2011-12-09 DIAGNOSIS — M6740 Ganglion, unspecified site: Secondary | ICD-10-CM | POA: Diagnosis not present

## 2012-01-11 ENCOUNTER — Other Ambulatory Visit: Payer: Self-pay | Admitting: Internal Medicine

## 2012-01-11 DIAGNOSIS — Z0100 Encounter for examination of eyes and vision without abnormal findings: Secondary | ICD-10-CM | POA: Diagnosis not present

## 2012-01-11 DIAGNOSIS — Z961 Presence of intraocular lens: Secondary | ICD-10-CM | POA: Diagnosis not present

## 2012-02-01 ENCOUNTER — Encounter: Payer: Self-pay | Admitting: Internal Medicine

## 2012-02-01 ENCOUNTER — Other Ambulatory Visit: Payer: Self-pay | Admitting: Cardiology

## 2012-02-01 ENCOUNTER — Ambulatory Visit (INDEPENDENT_AMBULATORY_CARE_PROVIDER_SITE_OTHER): Payer: Medicare Other | Admitting: Internal Medicine

## 2012-02-01 ENCOUNTER — Telehealth: Payer: Self-pay | Admitting: Internal Medicine

## 2012-02-01 VITALS — BP 140/70 | HR 72 | Temp 98.2°F | Resp 16 | Ht 63.0 in | Wt 122.0 lb

## 2012-02-01 DIAGNOSIS — E039 Hypothyroidism, unspecified: Secondary | ICD-10-CM | POA: Diagnosis not present

## 2012-02-01 DIAGNOSIS — R1319 Other dysphagia: Secondary | ICD-10-CM | POA: Diagnosis not present

## 2012-02-01 DIAGNOSIS — R490 Dysphonia: Secondary | ICD-10-CM

## 2012-02-01 DIAGNOSIS — K2100 Gastro-esophageal reflux disease with esophagitis, without bleeding: Secondary | ICD-10-CM | POA: Diagnosis not present

## 2012-02-01 DIAGNOSIS — M4120 Other idiopathic scoliosis, site unspecified: Secondary | ICD-10-CM

## 2012-02-01 DIAGNOSIS — I6529 Occlusion and stenosis of unspecified carotid artery: Secondary | ICD-10-CM

## 2012-02-01 MED ORDER — SUCRALFATE 1 GM/10ML PO SUSP: 1.0000 g | Freq: Four times a day (QID) | ORAL | Status: DC

## 2012-02-01 NOTE — Patient Instructions (Signed)
The patient is instructed to continue all medications as prescribed. Schedule followup with check out clerk upon leaving the clinic  

## 2012-02-01 NOTE — Telephone Encounter (Signed)
Caller: [name]/Patient; Patient Name: Rebecca [name]; PCP: Darryll Capers (Adults only); Best Callback Phone Number: [phone].  Patient calling about reglan.  States seen in office 02/01/12 and Dr. Lovell Sheehan started her on Carafate, and told her to only take her Nexium in the AM, but did not mention stopping the reglan.  Wants to know if she should continue the Reglan.  Per Epic note, Dr. Lovell Sheehan states "will stop the proton pump inhibitor and start Carafate."  Also note states "patient is to continue all medications as prescribed."  Info to office for staff/provider review/clarification/callback. May reach patient at [phone].

## 2012-02-01 NOTE — Progress Notes (Signed)
Subjective:    Patient ID: Rebecca [name], female    DOB: [birth year], 78 y.o.   MRN: [id number]  HPI  Patient is a 78 year old female who presents with chronic hoarseness of vocal quality, and GERD symptomology.  She last had a endoscopy in 2011 which showed gastric polyps but no evidence of erosive esophagitis at that time she does experience postprandial evening reflux.  She has elevated the head of her bed she's been on Nexium 40 mg by mouth twice a day without resolution of her symptoms she also takes Reglan 5 mg 3 times a day. She has not developed any neurologic symptoms from the Reglan specifically I do not visualize any tremor.   Review of Systems  Constitutional: Positive for fever and fatigue. Negative for activity change and appetite change.  HENT: Negative for ear pain, congestion, neck pain, postnasal drip and sinus pressure.   Eyes: Negative for redness and visual disturbance.  Respiratory: Negative for cough, shortness of breath and wheezing.   Gastrointestinal: Negative for abdominal pain and abdominal distention.  Genitourinary: Negative for dysuria, frequency and menstrual problem.  Musculoskeletal: Negative for myalgias, joint swelling and arthralgias.  Skin: Negative for rash and wound.  Neurological: Positive for weakness. Negative for dizziness and headaches.  Hematological: Negative for adenopathy. Does not bruise/bleed easily.  Psychiatric/Behavioral: Negative for disturbed wake/sleep cycle and decreased concentration.      Past Medical History  Diagnosis Date  . Hypertension   . Diverticulosis   . Esophageal stricture   . FH: colon polyps   . Anxiety   . Hemorrhoids   . Gastroparesis   . Anemia   . Hyperlipidemia     History   Social History  . Marital Status: Widowed    Spouse Name: N/A    Number of Children: N/A  . Years of Education: N/A   Occupational History  . Not on file.   Social History Main Topics  . Smoking status: Never  Smoker   . Smokeless tobacco: Never Used  . Alcohol Use: No  . Drug Use: No  . Sexually Active: Yes   Other Topics Concern  . Not on file   Social History Narrative  . No narrative on file    Past Surgical History  Procedure Date  . Tonsillectomy   . Appendectomy   . Arcuate keratectomy   . Knee surgery bilateral   . Hernia repair   . Double pallital tori bone removed    . Morton's neuroma on rt foot and platar facial release   . Mouth surgery     Family History  Problem Relation Age of Onset  . Crohn's disease    . Migraines    . Colon polyps Sister   . Cancer Sister   . Colon polyps Brother   . Diabetes Brother   . Hyperlipidemia Brother   . Hypertension Brother   . Colon polyps Daughter   . Cancer Daughter   . Stroke Daughter   . Cancer Maternal Aunt     colon  . Diabetes Mother   . Heart disease Mother   . Hyperlipidemia Mother   . Hypertension Mother   . Stroke Mother   . Anuerysm Father   . Heart disease Father   . Hypertension Father   . Cancer Paternal Grandmother     colon    Allergies  Allergen Reactions  . Ciprofloxacin     REACTION: Thrush  . Codeine   . Pneumococcal Vaccine Polyvalent  REACTION: RED AND RAISED RASH ON ARM  . Sulfamethoxazole W-Trimethoprim   . Tramadol Hcl   . Adhesive (Tape) Rash    Current Outpatient Prescriptions on File Prior to Visit  Medication Sig Dispense Refill  . ALPRAZolam (XANAX) 0.25 MG tablet Take 1 tablet (0.25 mg total) by mouth daily as needed.  30 tablet  2  . aspirin 325 MG buffered tablet Take 1 tablet (325 mg total) by mouth daily.      . Calcium Carbonate-Vitamin D (CALCIUM-VITAMIN D) 500-200 MG-UNIT per tablet Take 1 tablet by mouth 2 (two) times daily with a meal.        . Cholecalciferol (VITAMIN D3) 2000 UNITS TABS Take by mouth daily.        . Diphenhyd-Hydrocort-Nystatin (FIRST-DUKES MOUTHWASH) SUSP Use as directed 5 mLs in the mouth or throat 3 (three) times daily.  237 mL  0  .  esomeprazole (NEXIUM) 40 MG capsule Take 1 capsule (40 mg total) by mouth 2 (two) times daily.  60 capsule  11  . ezetimibe-simvastatin (VYTORIN) 10-20 MG per tablet Take 1 tablet by mouth at bedtime.        [name] Kitchen FERREX 150 150 MG capsule take 1 capsule by mouth once daily  30 capsule  6  . gabapentin (NEURONTIN) 100 MG capsule TAKE 1 CAPSULE BY MOUTH AT BEDTIME IF NEEDED  30 capsule  2  . losartan (COZAAR) 100 MG tablet Take 1 tablet (100 mg total) by mouth daily.  90 tablet  3  . metoCLOPramide (REGLAN) 5 MG tablet Take 1 tablet (5 mg total) by mouth 3 (three) times daily. Before lunch before dinner and before bedtime  90 tablet  6  . Probiotic Product (ALIGN PO) Take by mouth daily.        [name] Kitchen SYNTHROID 88 MCG tablet take 1 tablet by mouth once daily  30 tablet  9  . vitamin B-12 (CYANOCOBALAMIN) 500 MCG tablet Take 500 mcg by mouth daily.        . VOLTAREN 1 % GEL use as directed  100 g  1  . DISCONTD: olmesartan (BENICAR) 20 MG tablet Take 20 mg by mouth daily.          BP 140/70  Pulse 72  Temp 98.2 F (36.8 C)  Resp 16  Ht 5\' 3"  (1.6 m)  Wt 122 lb (55.339 kg)  BMI 21.61 kg/m2       Objective:   Physical Exam  Nursing note and vitals reviewed. Constitutional: She is oriented to person, place, and time. She appears well-developed and well-nourished. No distress.  HENT:  Head: Normocephalic and atraumatic.  Right Ear: External ear normal.  Left Ear: External ear normal.  Nose: Nose normal.  Mouth/Throat: Oropharynx is clear and moist.  Eyes: Conjunctivae normal and EOM are normal. Pupils are equal, round, and reactive to light.  Neck: Normal range of motion. Neck supple. No JVD present. No tracheal deviation present. No thyromegaly present.  Cardiovascular: Normal rate, regular rhythm, normal heart sounds and intact distal pulses.   No murmur heard. Pulmonary/Chest: Effort normal and breath sounds normal. She has no wheezes. She exhibits no tenderness.  Abdominal: Soft. Bowel  sounds are normal.  Musculoskeletal: Normal range of motion. She exhibits no edema and no tenderness.  Lymphadenopathy:    She has no cervical adenopathy.  Neurological: She is alert and oriented to person, place, and time. She has normal reflexes. No cranial nerve deficit.  Skin: Skin is warm and dry.  She is not diaphoretic.  Psychiatric: She has a normal mood and affect. Her behavior is normal.          Assessment & Plan:  Trial of Carafate liquid 1 g before meals and before bedtime and discontinuation of the proton pump inhibitor given no evidence of erosive esophagitis this is more functional reflux consideration for further evaluation by GI with either manometry or further testing to indicate need for reflux surgery if this intervention is not successful.  Hypertension stable  History of iron deficiency anemia reviewed July labs

## 2012-02-02 NOTE — Telephone Encounter (Signed)
Left message on machine Continue reglan per dr Lovell Sheehan

## 2012-02-06 ENCOUNTER — Encounter (INDEPENDENT_AMBULATORY_CARE_PROVIDER_SITE_OTHER): Payer: Medicare Other

## 2012-02-06 DIAGNOSIS — I6529 Occlusion and stenosis of unspecified carotid artery: Secondary | ICD-10-CM

## 2012-02-16 ENCOUNTER — Telehealth: Payer: Self-pay | Admitting: Internal Medicine

## 2012-02-16 ENCOUNTER — Encounter: Payer: Self-pay | Admitting: Family

## 2012-02-16 ENCOUNTER — Ambulatory Visit (INDEPENDENT_AMBULATORY_CARE_PROVIDER_SITE_OTHER): Payer: Medicare Other | Admitting: Family

## 2012-02-16 VITALS — BP 130/78 | HR 69 | Temp 97.8°F | Wt 123.0 lb

## 2012-02-16 DIAGNOSIS — R059 Cough, unspecified: Secondary | ICD-10-CM

## 2012-02-16 DIAGNOSIS — R05 Cough: Secondary | ICD-10-CM | POA: Diagnosis not present

## 2012-02-16 DIAGNOSIS — J019 Acute sinusitis, unspecified: Secondary | ICD-10-CM

## 2012-02-16 MED ORDER — AZITHROMYCIN 250 MG PO TABS: ORAL_TABLET | ORAL | Status: DC

## 2012-02-16 NOTE — Telephone Encounter (Signed)
Caller: Tashona/Patient; Patient Name: Rebecca [name]; PCP: Darryll Capers (Adults only); Best Callback Phone Number: [phone]. Called to request Zpack for "sinus infection."  Informed per MD orders must be seen for antibitotics.  Cold symptoms began: 02/12/12.  Temp unknown/no chills.  Reports sinus infection symptoms began 02/16/12; head feels "like a basketball", frontal headache X 4 days and thick post nasal discharge. Productive cough with green mucus "like it has scabs on it."  Using combination otc med with expectorant, decongestant, Tylenol and Guaifenesin and q 4 hours.  Suggested to avoid combination otc medications, especially decongestants. Hydrate and humidify.  Advised to see MD within 24 hours for mild moderate headache for more than 24 hours unrelieved with otc medications per Upper Respiratory Infection guideline.  Appointment scheduled with Lubertha Sayres, FNP for 1045 02/16/12.

## 2012-02-16 NOTE — Progress Notes (Signed)
Subjective:    Patient ID: Rebecca [name], female    DOB: February 09, 1933, 78 y.o.   MRN: [id number]  HPI 79 year old white female, nonsmoker, patient of Dr. Lovell Sheehan is in today with complaints of sneezing, cough, congestion, sinus pressure and pain x4 days. Her symptoms had improved yesterday. However, when she woke up this morning, they were worse. She's been taken Mucinex for relief but hasn't work. Well. She denies any fever, muscle aches or pain. She has a history of sinusitis in the past most recently 2 years ago.   Review of Systems  Constitutional: Negative.   HENT: Positive for congestion, sore throat, rhinorrhea and sinus pressure.   Eyes: Negative.   Respiratory: Positive for cough. Negative for shortness of breath and wheezing.   Cardiovascular: Negative.   Musculoskeletal: Negative.   Skin: Negative.   Neurological: Negative.   Hematological: Negative.   Psychiatric/Behavioral: Negative.    Past Medical History  Diagnosis Date  . Hypertension   . Diverticulosis   . Esophageal stricture   . FH: colon polyps   . Anxiety   . Hemorrhoids   . Gastroparesis   . Anemia   . Hyperlipidemia     History   Social History  . Marital Status: Widowed    Spouse Name: N/A    Number of Children: N/A  . Years of Education: N/A   Occupational History  . Not on file.   Social History Main Topics  . Smoking status: Never Smoker   . Smokeless tobacco: Never Used  . Alcohol Use: No  . Drug Use: No  . Sexually Active: Yes   Other Topics Concern  . Not on file   Social History Narrative  . No narrative on file    Past Surgical History  Procedure Date  . Tonsillectomy   . Appendectomy   . Arcuate keratectomy   . Knee surgery bilateral   . Hernia repair   . Double pallital tori bone removed    . Morton's neuroma on rt foot and platar facial release   . Mouth surgery     Family History  Problem Relation Age of Onset  . Crohn's disease    . Migraines    . Colon  polyps Sister   . Cancer Sister   . Colon polyps Brother   . Diabetes Brother   . Hyperlipidemia Brother   . Hypertension Brother   . Colon polyps Daughter   . Cancer Daughter   . Stroke Daughter   . Cancer Maternal Aunt     colon  . Diabetes Mother   . Heart disease Mother   . Hyperlipidemia Mother   . Hypertension Mother   . Stroke Mother   . Anuerysm Father   . Heart disease Father   . Hypertension Father   . Cancer Paternal Grandmother     colon    Allergies  Allergen Reactions  . Ciprofloxacin     REACTION: Thrush  . Codeine   . Pneumococcal Vaccine Polyvalent     REACTION: RED AND RAISED RASH ON ARM  . Sulfamethoxazole W-Trimethoprim   . Tramadol Hcl   . Adhesive (Tape) Rash    Current Outpatient Prescriptions on File Prior to Visit  Medication Sig Dispense Refill  . ALPRAZolam (XANAX) 0.25 MG tablet Take 1 tablet (0.25 mg total) by mouth daily as needed.  30 tablet  2  . aspirin 325 MG buffered tablet Take 1 tablet (325 mg total) by mouth daily.      [name] Kitchen  Calcium Carbonate-Vitamin D (CALCIUM-VITAMIN D) 500-200 MG-UNIT per tablet Take 1 tablet by mouth 2 (two) times daily with a meal.        . Cholecalciferol (VITAMIN D3) 2000 UNITS TABS Take by mouth daily.        . Diphenhyd-Hydrocort-Nystatin (FIRST-DUKES MOUTHWASH) SUSP Use as directed 5 mLs in the mouth or throat 3 (three) times daily.  237 mL  0  . esomeprazole (NEXIUM) 40 MG capsule Take 1 capsule (40 mg total) by mouth 2 (two) times daily.  60 capsule  11  . ezetimibe-simvastatin (VYTORIN) 10-20 MG per tablet Take 1 tablet by mouth at bedtime.        [name] Kitchen FERREX 150 150 MG capsule take 1 capsule by mouth once daily  30 capsule  6  . gabapentin (NEURONTIN) 100 MG capsule TAKE 1 CAPSULE BY MOUTH AT BEDTIME IF NEEDED  30 capsule  2  . losartan (COZAAR) 100 MG tablet Take 1 tablet (100 mg total) by mouth daily.  90 tablet  3  . metoCLOPramide (REGLAN) 5 MG tablet Take 1 tablet (5 mg total) by mouth 3 (three) times  daily. Before lunch before dinner and before bedtime  90 tablet  6  . Probiotic Product (ALIGN PO) Take by mouth daily.        . sucralfate (CARAFATE) 1 GM/10ML suspension Take 10 mLs (1 g total) by mouth 4 (four) times daily.  420 mL  0  . SYNTHROID 88 MCG tablet take 1 tablet by mouth once daily  30 tablet  9  . vitamin B-12 (CYANOCOBALAMIN) 500 MCG tablet Take 500 mcg by mouth daily.        . VOLTAREN 1 % GEL use as directed  100 g  1  . [DISCONTINUED] olmesartan (BENICAR) 20 MG tablet Take 20 mg by mouth daily.          BP 130/78  Pulse 69  Temp 97.8 F (36.6 C) (Oral)  Wt 123 lb (55.792 kg)  SpO2 98%chart    Objective:   Physical Exam  Constitutional: She is oriented to person, place, and time. She appears well-developed and well-nourished.  HENT:  Right Ear: External ear normal.  Left Ear: External ear normal.  Nose: Nose normal.       Sinus tenderness to palpation of the frontal sinuses. Moderate redness of the pharynx.  Neck: Normal range of motion. Neck supple.  Cardiovascular: Normal rate, regular rhythm and normal heart sounds.   Pulmonary/Chest: Effort normal and breath sounds normal.  Neurological: She is alert and oriented to person, place, and time.  Skin: Skin is warm and dry.  Psychiatric: She has a normal mood and affect.          Assessment & Plan:  Assessment: Acute sinusitis, cough  Plan: Z-Pak as directed. Over-the-counter Zyrtec or Claritin once daily. Patient call the office if symptoms worsen or persist. Recheck a schedule, appearing.

## 2012-02-17 DIAGNOSIS — M810 Age-related osteoporosis without current pathological fracture: Secondary | ICD-10-CM | POA: Diagnosis not present

## 2012-02-23 ENCOUNTER — Other Ambulatory Visit: Payer: Self-pay | Admitting: Obstetrics

## 2012-02-25 ENCOUNTER — Other Ambulatory Visit: Payer: Self-pay | Admitting: Internal Medicine

## 2012-03-06 ENCOUNTER — Encounter (HOSPITAL_COMMUNITY)
Admission: RE | Admit: 2012-03-06 | Discharge: 2012-03-06 | Disposition: A | Discharge status: Home / Self Care | Payer: Medicare Other | Source: Ambulatory Visit | Attending: Obstetrics | Admitting: Obstetrics

## 2012-03-06 ENCOUNTER — Encounter (HOSPITAL_COMMUNITY): Payer: Self-pay

## 2012-03-06 DIAGNOSIS — M810 Age-related osteoporosis without current pathological fracture: Secondary | ICD-10-CM | POA: Diagnosis not present

## 2012-03-06 MED ORDER — ZOLEDRONIC ACID 5 MG/100ML IV SOLN
5.0000 mg | Freq: Once | INTRAVENOUS | Status: AC
  Administered 2012-03-06: 5 mg via INTRAVENOUS
  Filled 2012-03-06: qty 100

## 2012-03-06 MED ORDER — SODIUM CHLORIDE 0.9 % IV SOLN
INTRAVENOUS | Status: DC
  Administered 2012-03-06: 14:00:00 via INTRAVENOUS

## 2012-03-13 ENCOUNTER — Other Ambulatory Visit: Payer: Self-pay | Admitting: Internal Medicine

## 2012-04-12 DIAGNOSIS — N909 Noninflammatory disorder of vulva and perineum, unspecified: Secondary | ICD-10-CM | POA: Diagnosis not present

## 2012-04-12 DIAGNOSIS — L940 Localized scleroderma [morphea]: Secondary | ICD-10-CM | POA: Diagnosis not present

## 2012-04-12 DIAGNOSIS — L821 Other seborrheic keratosis: Secondary | ICD-10-CM | POA: Diagnosis not present

## 2012-04-13 ENCOUNTER — Other Ambulatory Visit: Payer: Self-pay | Admitting: Internal Medicine

## 2012-04-20 DIAGNOSIS — L940 Localized scleroderma [morphea]: Secondary | ICD-10-CM | POA: Diagnosis not present

## 2012-05-04 ENCOUNTER — Ambulatory Visit (INDEPENDENT_AMBULATORY_CARE_PROVIDER_SITE_OTHER): Payer: Medicare Other | Admitting: Internal Medicine

## 2012-05-04 ENCOUNTER — Encounter: Payer: Self-pay | Admitting: Internal Medicine

## 2012-05-04 VITALS — BP 130/80 | HR 60 | Temp 97.9°F | Wt 121.0 lb

## 2012-05-04 DIAGNOSIS — K219 Gastro-esophageal reflux disease without esophagitis: Secondary | ICD-10-CM | POA: Diagnosis not present

## 2012-05-04 DIAGNOSIS — K3184 Gastroparesis: Secondary | ICD-10-CM | POA: Diagnosis not present

## 2012-05-04 DIAGNOSIS — E559 Vitamin D deficiency, unspecified: Secondary | ICD-10-CM | POA: Diagnosis not present

## 2012-05-04 DIAGNOSIS — T887XXA Unspecified adverse effect of drug or medicament, initial encounter: Secondary | ICD-10-CM | POA: Diagnosis not present

## 2012-05-04 DIAGNOSIS — I10 Essential (primary) hypertension: Secondary | ICD-10-CM

## 2012-05-04 DIAGNOSIS — IMO0002 Reserved for concepts with insufficient information to code with codable children: Secondary | ICD-10-CM

## 2012-05-04 DIAGNOSIS — M792 Neuralgia and neuritis, unspecified: Secondary | ICD-10-CM

## 2012-05-04 DIAGNOSIS — G4700 Insomnia, unspecified: Secondary | ICD-10-CM

## 2012-05-04 LAB — CBC WITH DIFFERENTIAL/PLATELET
Basophils Absolute: 0.1 10*3/uL (ref 0.0–0.1)
Basophils Relative: 0.9 % (ref 0.0–3.0)
Eosinophils Absolute: 0.3 10*3/uL (ref 0.0–0.7)
Eosinophils Relative: 3 % (ref 0.0–5.0)
HCT: 36.3 % (ref 36.0–46.0)
Hemoglobin: 12.2 g/dL (ref 12.0–15.0)
Lymphocytes Relative: 30.8 % (ref 12.0–46.0)
Lymphs Abs: 2.7 10*3/uL (ref 0.7–4.0)
MCHC: 33.7 g/dL (ref 30.0–36.0)
MCV: 96.7 fl (ref 78.0–100.0)
Monocytes Absolute: 0.8 10*3/uL (ref 0.1–1.0)
Monocytes Relative: 9.4 % (ref 3.0–12.0)
Neutro Abs: 4.9 10*3/uL (ref 1.4–7.7)
Neutrophils Relative %: 55.9 % (ref 43.0–77.0)
Platelets: 264 10*3/uL (ref 150.0–400.0)
RBC: 3.76 Mil/uL — ABNORMAL LOW (ref 3.87–5.11)
RDW: 12.9 % (ref 11.5–14.6)
WBC: 8.9 10*3/uL (ref 4.5–10.5)

## 2012-05-04 LAB — BASIC METABOLIC PANEL
BUN: 12 mg/dL (ref 6–23)
CO2: 28 mEq/L (ref 19–32)
Calcium: 9.5 mg/dL (ref 8.4–10.5)
Chloride: 99 mEq/L (ref 96–112)
Creatinine, Ser: 0.9 mg/dL (ref 0.4–1.2)
GFR: 66.19 mL/min (ref >60.00)
Glucose, Bld: 84 mg/dL (ref 70–99)
Potassium: 4.4 mEq/L (ref 3.5–5.1)
Sodium: 133 mEq/L — ABNORMAL LOW (ref 135–145)

## 2012-05-04 MED ORDER — DIAZEPAM 5 MG PO TABS: 5.0000 mg | ORAL_TABLET | Freq: Every evening | ORAL | Status: DC | PRN

## 2012-05-04 MED ORDER — LINACLOTIDE 145 MCG PO CAPS: 145.0000 ug | ORAL_CAPSULE | Freq: Every day | ORAL | Status: DC

## 2012-05-04 NOTE — Progress Notes (Signed)
Subjective:    Patient ID: Rebecca [name], female    DOB: Sep 29, 1932, 79 y.o.   MRN: [id number]  HPI Some hand tremor that may be related to the reglan Change to linzess   145   Review of Systems  Constitutional: Negative for activity change, appetite change and fatigue.  HENT: Negative for ear pain, congestion, neck pain, postnasal drip and sinus pressure.   Eyes: Negative for redness and visual disturbance.  Respiratory: Negative for cough, shortness of breath and wheezing.   Gastrointestinal: Positive for abdominal pain. Negative for abdominal distention.  Genitourinary: Negative for dysuria, frequency and menstrual problem.  Musculoskeletal: Negative for myalgias, joint swelling and arthralgias.  Skin: Negative for rash and wound.  Neurological: Positive for tremors and weakness. Negative for dizziness and headaches.  Hematological: Negative for adenopathy. Does not bruise/bleed easily.  Psychiatric/Behavioral: Negative for sleep disturbance and decreased concentration.   Past Medical History  Diagnosis Date  . Hypertension   . Diverticulosis   . Esophageal stricture   . FH: colon polyps   . Anxiety   . Hemorrhoids   . Gastroparesis   . Anemia   . Hyperlipidemia     History   Social History  . Marital Status: Widowed    Spouse Name: N/A    Number of Children: N/A  . Years of Education: N/A   Occupational History  . Not on file.   Social History Main Topics  . Smoking status: Never Smoker   . Smokeless tobacco: Never Used  . Alcohol Use: No  . Drug Use: No  . Sexually Active: Yes   Other Topics Concern  . Not on file   Social History Narrative  . No narrative on file    Past Surgical History  Procedure Date  . Tonsillectomy   . Appendectomy   . Arcuate keratectomy   . Knee surgery bilateral   . Hernia repair   . Double pallital tori bone removed    . Morton's neuroma on rt foot and platar facial release   . Mouth surgery     Family  History  Problem Relation Age of Onset  . Crohn's disease    . Migraines    . Colon polyps Sister   . Cancer Sister   . Colon polyps Brother   . Diabetes Brother   . Hyperlipidemia Brother   . Hypertension Brother   . Colon polyps Daughter   . Cancer Daughter   . Stroke Daughter   . Cancer Maternal Aunt     colon  . Diabetes Mother   . Heart disease Mother   . Hyperlipidemia Mother   . Hypertension Mother   . Stroke Mother   . Anuerysm Father   . Heart disease Father   . Hypertension Father   . Cancer Paternal Grandmother     colon    Allergies  Allergen Reactions  . Ciprofloxacin     REACTION: Thrush  . Codeine   . Pneumococcal Vaccine Polyvalent     REACTION: RED AND RAISED RASH ON ARM  . Sulfamethoxazole W-Trimethoprim   . Tramadol Hcl   . Adhesive (Tape) Rash    Current Outpatient Prescriptions on File Prior to Visit  Medication Sig Dispense Refill  . ALPRAZolam (XANAX) 0.25 MG tablet take 1 tablet by mouth once daily if needed  30 tablet  3  . Calcium Carbonate-Vitamin D (CALCIUM-VITAMIN D) 500-200 MG-UNIT per tablet Take 1 tablet by mouth 2 (two) times daily with a meal.        .  Cholecalciferol (VITAMIN D3) 2000 UNITS TABS Take by mouth daily.        . Diphenhyd-Hydrocort-Nystatin (FIRST-DUKES MOUTHWASH) SUSP Use as directed 5 mLs in the mouth or throat 3 (three) times daily.  237 mL  0  . esomeprazole (NEXIUM) 40 MG capsule Take 1 capsule (40 mg total) by mouth 2 (two) times daily.  60 capsule  11  . ezetimibe-simvastatin (VYTORIN) 10-20 MG per tablet Take 1 tablet by mouth at bedtime.        [name] Kitchen FERREX 150 150 MG capsule take 1 capsule by mouth once daily  30 capsule  6  . gabapentin (NEURONTIN) 100 MG capsule take 1 capsule by mouth at bedtime if needed  30 capsule  3  . losartan (COZAAR) 100 MG tablet Take 1 tablet (100 mg total) by mouth daily.  90 tablet  3  . metoCLOPramide (REGLAN) 5 MG tablet Take 1 tablet (5 mg total) by mouth 3 (three) times daily.  Before lunch before dinner and before bedtime  90 tablet  6  . Probiotic Product (ALIGN PO) Take by mouth daily.        [name] Kitchen SYNTHROID 88 MCG tablet take 1 tablet by mouth once daily  30 each  9  . vitamin B-12 (CYANOCOBALAMIN) 500 MCG tablet Take 500 mcg by mouth daily.        . VOLTAREN 1 % GEL use as directed  100 g  1  . sucralfate (CARAFATE) 1 GM/10ML suspension Take 10 mLs (1 g total) by mouth 4 (four) times daily.  420 mL  0  . [DISCONTINUED] olmesartan (BENICAR) 20 MG tablet Take 20 mg by mouth daily.          BP 130/80  Pulse 60  Temp 97.9 F (36.6 C) (Oral)  Wt 121 lb (54.885 kg)       Objective:   Physical Exam  Constitutional: She is oriented to person, place, and time. She appears well-developed and well-nourished. No distress.  HENT:  Head: Normocephalic and atraumatic.  Right Ear: External ear normal.  Left Ear: External ear normal.  Nose: Nose normal.  Mouth/Throat: Oropharynx is clear and moist.  Eyes: Conjunctivae normal and EOM are normal. Pupils are equal, round, and reactive to light.  Neck: Normal range of motion. Neck supple. No JVD present. No tracheal deviation present. No thyromegaly present.  Cardiovascular: Normal rate, regular rhythm, normal heart sounds and intact distal pulses.   No murmur heard. Pulmonary/Chest: Effort normal and breath sounds normal. She has no wheezes. She exhibits no tenderness.  Abdominal: Soft. Bowel sounds are normal. She exhibits distension. There is tenderness.  Musculoskeletal: Normal range of motion. She exhibits no edema and no tenderness.  Lymphadenopathy:    She has no cervical adenopathy.  Neurological: She is alert and oriented to person, place, and time. She has normal reflexes. No cranial nerve deficit.       Hand tremor  Skin: Skin is warm and dry. She is not diaphoretic.  Psychiatric: She has a normal mood and affect. Her behavior is normal.          Assessment & Plan:  Possible side effect of reglan  "tremor" Stop the reglan and try linzess BET vs Parkinson effect of the drug Trial of another promotility drug Continue the neurotin at 100

## 2012-05-04 NOTE — Patient Instructions (Addendum)
Stop the Reglan and do a trial of the new drug over the next couple of weeks the first couple days she takes is the medication he may notice looser stools this is to be expected but then they should return closer to normal.  If it alleviates your reflux symptoms and will continue this medication. Take careful note about the tremor to see if it resolves after the stopping of the Reglan  Be sure to activate you my chart so that you can send me an e-mail in 2 weeks about how the medication is doing

## 2012-05-05 LAB — VITAMIN D 25 HYDROXY (VIT D DEFICIENCY, FRACTURES): Vit D, 25-Hydroxy: 55 ng/mL (ref 30–89)

## 2012-05-08 LAB — METHYLMALONIC ACID, SERUM: Methylmalonic Acid, Quant: 0.13 umol/L (ref <0.40)

## 2012-05-23 ENCOUNTER — Encounter: Payer: Self-pay | Admitting: Internal Medicine

## 2012-05-23 ENCOUNTER — Telehealth: Payer: Self-pay | Admitting: Internal Medicine

## 2012-05-23 ENCOUNTER — Ambulatory Visit (INDEPENDENT_AMBULATORY_CARE_PROVIDER_SITE_OTHER): Payer: Medicare Other | Admitting: Internal Medicine

## 2012-05-23 VITALS — BP 134/74 | HR 64 | Temp 97.9°F | Resp 16 | Ht 63.0 in | Wt 121.0 lb

## 2012-05-23 DIAGNOSIS — J0110 Acute frontal sinusitis, unspecified: Secondary | ICD-10-CM | POA: Diagnosis not present

## 2012-05-23 MED ORDER — AZITHROMYCIN 250 MG PO TABS: ORAL_TABLET | ORAL | Status: DC

## 2012-05-23 NOTE — Progress Notes (Signed)
  Subjective:     Rebecca [name] is a 79 y.o. female who presents for evaluation of sinus pain. Symptoms include: congestion, facial pain, foul rhinorrhea, headaches and sinus pressure. Onset of symptoms was 6 days ago. Symptoms have been gradually worsening since that time. Past history is significant for occasional episodes of bronchitis. Patient is a non-smoker.  The following portions of the patient's history were reviewed and updated as appropriate: allergies, current medications, past family history, past medical history, past social history, past surgical history and problem list.  Review of Systems Pertinent items are noted in HPI.   Objective:    General appearance: alert and fatigued Head: Normocephalic, without obvious abnormality, atraumatic Eyes: positive findings: conjunctiva: trace injection Ears: abnormal TM right ear - retracted and abnormal TM left ear - retracted Nose: Nares normal. Septum midline. Mucosa normal. No drainage or sinus tenderness., purulent discharge, moderate congestion, turbinates swollen Throat: lips, mucosa, and tongue normal; teeth and gums normal Neck: no adenopathy, no carotid bruit, no JVD, supple, symmetrical, trachea midline and thyroid not enlarged, symmetric, no tenderness/mass/nodules Lungs: normal percussion bilaterally Heart: regular rate and rhythm    Assessment:    Acute bacterial sinusitis.    Plan:    Nasal saline sprays. Zithromax per medication orders.

## 2012-05-23 NOTE — Telephone Encounter (Signed)
Patient Information:  Caller Name: Katiria  Phone: [phone]  Patient: Rebecca, [name]  Gender: Female  DOB: December 29, 1932  Age: 79 Years  PCP: Darryll Capers (Adults only)  Office Follow Up:  Does the office need to follow up with this patient?: No  Instructions For The Office: N/A  RN Note:  Pt requesting an antibiotic to be called in, advised must be seen.  Symptoms  Reason For Call & Symptoms:  Sinus infection.  Increased burning sensation in sinuses with pressure.  Constant dull headache.   Reviewed Health History In EMR: Yes  Reviewed Medications In EMR: Yes  Reviewed Allergies In EMR: Yes  Reviewed Surgeries / Procedures: Yes  Date of Onset of Symptoms: Unknown  Treatments Tried: OTC Sinus and cold medication  Treatments Tried Worked: No  Guideline(s) Used:  Sinus Pain and Congestion  Disposition Per Guideline:   See Today or Tomorrow in Office  Reason For Disposition Reached:   Patient wants to be seen  Advice Given:  N/A  Appointment Scheduled:  05/23/2012 11:00:00 Appointment Scheduled Provider:  Darryll Capers (Adults only)

## 2012-05-23 NOTE — Patient Instructions (Signed)
Place sinusitis patient instructions here.

## 2012-06-20 ENCOUNTER — Other Ambulatory Visit: Payer: Self-pay | Admitting: Internal Medicine

## 2012-06-25 ENCOUNTER — Telehealth: Payer: Self-pay | Admitting: Internal Medicine

## 2012-06-25 NOTE — Telephone Encounter (Signed)
Talked with pt and sh e will see padonda on Wednesday at 11;15

## 2012-06-25 NOTE — Telephone Encounter (Signed)
Patient Information:  Caller Name: [name]  Phone: [phone]  Patient: Rebecca, [name]  Gender: Female  DOB: 1932-07-26  Age: 79 Years  PCP: Darryll Capers (Adults only)  Office Follow Up:  Does the office need to follow up with this patient?: Yes  Instructions For The Office: OFFICE COULD YOU PLEASE SEE IF PATIENT CAN BE WORKED INTO DR Lovell Sheehan SCHEDULE?  PLEASE FOLLOW UP WITH PATIENT  RN Note:  Pt was calling to get an appt with Dr Lovell Sheehan.  Rn did not find any appt with Dr Lovell Sheehan at all this week.  Pt did not want to see anyone else with her history of stomach problems.  Symptoms  Reason For Call & Symptoms: pt reports she has been having nausea for a couple of weeks.  Pt denies any vomiting.    Reviewed Health History In EMR: Yes  Reviewed Medications In EMR: Yes  Reviewed Allergies In EMR: Yes  Reviewed Surgeries / Procedures: Yes  Date of Onset of Symptoms: 06/11/2012  Treatments Tried: Dramamine  Treatments Tried Worked: No  Guideline(s) Used:  No Protocol Available - Sick Adult  Disposition Per Guideline:   Discuss with PCP and Callback by Nurse Today  Reason For Disposition Reached:   Nursing judgment  Advice Given:  N/A  Patient Will Follow Care Advice:  YES

## 2012-06-27 ENCOUNTER — Encounter: Payer: Self-pay | Admitting: Internal Medicine

## 2012-06-27 ENCOUNTER — Ambulatory Visit: Payer: Medicare Other | Admitting: Family

## 2012-06-27 ENCOUNTER — Ambulatory Visit (INDEPENDENT_AMBULATORY_CARE_PROVIDER_SITE_OTHER): Payer: Medicare Other | Admitting: Internal Medicine

## 2012-06-27 VITALS — BP 140/76 | HR 80 | Temp 98.2°F | Resp 16 | Ht 63.0 in | Wt 118.0 lb

## 2012-06-27 DIAGNOSIS — G8929 Other chronic pain: Secondary | ICD-10-CM

## 2012-06-27 DIAGNOSIS — R1013 Epigastric pain: Secondary | ICD-10-CM

## 2012-06-27 DIAGNOSIS — K219 Gastro-esophageal reflux disease without esophagitis: Secondary | ICD-10-CM

## 2012-06-27 LAB — HEPATIC FUNCTION PANEL
ALT: 14 U/L (ref 0–35)
AST: 17 U/L (ref 0–37)
Albumin: 3.6 g/dL (ref 3.5–5.2)
Alkaline Phosphatase: 67 U/L (ref 39–117)
Bilirubin, Direct: 0.1 mg/dL (ref 0.0–0.3)
Total Bilirubin: 0.4 mg/dL (ref 0.3–1.2)
Total Protein: 6.6 g/dL (ref 6.0–8.3)

## 2012-06-27 LAB — CBC WITH DIFFERENTIAL/PLATELET
Basophils Absolute: 0.1 10*3/uL (ref 0.0–0.1)
Basophils Relative: 1 % (ref 0.0–3.0)
Eosinophils Absolute: 0.2 10*3/uL (ref 0.0–0.7)
Eosinophils Relative: 2.2 % (ref 0.0–5.0)
HCT: 36.1 % (ref 36.0–46.0)
Hemoglobin: 12.1 g/dL (ref 12.0–15.0)
Lymphocytes Relative: 28 % (ref 12.0–46.0)
Lymphs Abs: 2 10*3/uL (ref 0.7–4.0)
MCHC: 33.5 g/dL (ref 30.0–36.0)
MCV: 96.5 fl (ref 78.0–100.0)
Monocytes Absolute: 0.6 10*3/uL (ref 0.1–1.0)
Monocytes Relative: 8.7 % (ref 3.0–12.0)
Neutro Abs: 4.3 10*3/uL (ref 1.4–7.7)
Neutrophils Relative %: 60.1 % (ref 43.0–77.0)
Platelets: 256 10*3/uL (ref 150.0–400.0)
RBC: 3.74 Mil/uL — ABNORMAL LOW (ref 3.87–5.11)
RDW: 13 % (ref 11.5–14.6)
WBC: 7.1 10*3/uL (ref 4.5–10.5)

## 2012-06-27 MED ORDER — RANITIDINE HCL 300 MG PO TABS: 300.0000 mg | ORAL_TABLET | Freq: Every day | ORAL | Status: DC

## 2012-06-27 MED ORDER — METOCLOPRAMIDE HCL 5 MG PO TABS: 5.0000 mg | ORAL_TABLET | Freq: Two times a day (BID) | ORAL | Status: DC

## 2012-06-27 NOTE — Progress Notes (Signed)
Subjective:    Patient ID: Rebecca [name], female    DOB: 04/14/1932, 79 y.o.   MRN: [id number]  HPI CT in 2010 unremarkable Could not tolerate promotility agents amitiza and linzesse persistent abdominal pain and nausea Worsened with stress. Burning in mid chest After eating increased pain Has lost weight due to decreased appetiite Has been on nexium Stopping the reglan has worsened the symptoms Tremors have improved Not constipated      Review of Systems  Constitutional: Negative for activity change, appetite change and fatigue.  HENT: Negative for ear pain, congestion, neck pain, postnasal drip and sinus pressure.   Eyes: Negative for redness and visual disturbance.  Respiratory: Negative for cough, shortness of breath and wheezing.   Gastrointestinal: Positive for abdominal pain and abdominal distention.  Genitourinary: Negative for dysuria, frequency and menstrual problem.  Musculoskeletal: Negative for myalgias, joint swelling and arthralgias.  Skin: Negative for rash and wound.  Neurological: Negative for dizziness, weakness and headaches.  Hematological: Negative for adenopathy. Does not bruise/bleed easily.  Psychiatric/Behavioral: Negative for sleep disturbance and decreased concentration.   Past Medical History  Diagnosis Date  . Hypertension   . Diverticulosis   . Esophageal stricture   . FH: colon polyps   . Anxiety   . Hemorrhoids   . Gastroparesis   . Anemia   . Hyperlipidemia     History   Social History  . Marital Status: Widowed    Spouse Name: N/A    Number of Children: N/A  . Years of Education: N/A   Occupational History  . Not on file.   Social History Main Topics  . Smoking status: Never Smoker   . Smokeless tobacco: Never Used  . Alcohol Use: No  . Drug Use: No  . Sexually Active: Yes   Other Topics Concern  . Not on file   Social History Narrative  . No narrative on file    Past Surgical History  Procedure  Laterality Date  . Tonsillectomy    . Appendectomy    . Arcuate keratectomy    . Knee surgery bilateral    . Hernia repair    . Double pallital tori bone removed     . Morton's neuroma on rt foot and platar facial release    . Mouth surgery      Family History  Problem Relation Age of Onset  . Crohn's disease    . Migraines    . Colon polyps Sister   . Cancer Sister   . Colon polyps Brother   . Diabetes Brother   . Hyperlipidemia Brother   . Hypertension Brother   . Colon polyps Daughter   . Cancer Daughter   . Stroke Daughter   . Cancer Maternal Aunt     colon  . Diabetes Mother   . Heart disease Mother   . Hyperlipidemia Mother   . Hypertension Mother   . Stroke Mother   . Anuerysm Father   . Heart disease Father   . Hypertension Father   . Cancer Paternal Grandmother     colon    Allergies  Allergen Reactions  . Ciprofloxacin     REACTION: Thrush  . Codeine   . Pneumococcal Vaccine Polyvalent     REACTION: RED AND RAISED RASH ON ARM  . Sulfamethoxazole W-Trimethoprim   . Tramadol Hcl   . Adhesive (Tape) Rash    Current Outpatient Prescriptions on File Prior to Visit  Medication Sig Dispense Refill  . ALPRAZolam (  XANAX) 0.25 MG tablet take 1 tablet by mouth once daily if needed  30 tablet  3  . Calcium Carbonate-Vitamin D (CALCIUM-VITAMIN D) 500-200 MG-UNIT per tablet Take 1 tablet by mouth 2 (two) times daily with a meal.        . Cholecalciferol (VITAMIN D3) 2000 UNITS TABS Take by mouth daily.        . diazepam (VALIUM) 5 MG tablet Take 1 tablet (5 mg total) by mouth at bedtime as needed for anxiety or sleep.  30 tablet  0  . esomeprazole (NEXIUM) 40 MG capsule Take 1 capsule (40 mg total) by mouth 2 (two) times daily.  60 capsule  11  . ezetimibe-simvastatin (VYTORIN) 10-20 MG per tablet Take 1 tablet by mouth at bedtime.        [name] Kitchen FERREX 150 150 MG capsule take 1 capsule by mouth once daily  30 capsule  6  . gabapentin (NEURONTIN) 100 MG capsule take 1  capsule by mouth at bedtime if needed  30 capsule  3  . losartan (COZAAR) 100 MG tablet Take 1 tablet (100 mg total) by mouth daily.  90 tablet  3  . Probiotic Product (ALIGN PO) Take by mouth daily.        [name] Kitchen SYNTHROID 88 MCG tablet take 1 tablet by mouth once daily  30 each  9  . triamcinolone ointment (KENALOG) 0.1 %       . vitamin B-12 (CYANOCOBALAMIN) 500 MCG tablet Take 500 mcg by mouth daily.        . VOLTAREN 1 % GEL use as directed  100 g  1  . [DISCONTINUED] olmesartan (BENICAR) 20 MG tablet Take 20 mg by mouth daily.         No current facility-administered medications on file prior to visit.    BP 140/76  Pulse 80  Temp(Src) 98.2 F (36.8 C)  Resp 16  Ht 5\' 3"  (1.6 m)  Wt 118 lb (53.524 kg)  BMI 20.91 kg/m2        Objective:   Physical Exam  Nursing note and vitals reviewed. Constitutional: She appears well-developed and well-nourished.  HENT:  Head: Normocephalic and atraumatic.  Neck: Normal range of motion. Neck supple.  Cardiovascular: Normal rate and regular rhythm.   Murmur heard. Pulmonary/Chest: Effort normal and breath sounds normal.  Abdominal: She exhibits no mass. There is tenderness. There is no rebound and no guarding.  Skin: Skin is warm and dry.          Assessment & Plan:  Were going to resume the Reglan on a limited basis due to the persistence of her reflux symptoms and burning.  We are going to add Zantac 300 mg at bedtime to Nexium in the morning. An ultrasound of the abdomen and liver has been ordered as well as liver functions and a blood count today   If the symptoms do no improve EGD

## 2012-06-27 NOTE — Patient Instructions (Addendum)
We are going 3 things 1. we are going to add Zantac 300 mg at bedtime and take your Nexium first thing in the morning 2. we are going to get ultrasound of your abdomen and liver 3. We are going to start back on the Reglan 5 mg before lunch and before supper. If the tremor worsens cut the Reglan to just suppertime

## 2012-06-29 ENCOUNTER — Encounter: Payer: Self-pay | Admitting: Gastroenterology

## 2012-07-02 ENCOUNTER — Ambulatory Visit
Admission: RE | Admit: 2012-07-02 | Discharge: 2012-07-02 | Disposition: A | Discharge status: Home / Self Care | Payer: Medicare Other | Source: Ambulatory Visit | Attending: Internal Medicine | Admitting: Internal Medicine

## 2012-07-02 DIAGNOSIS — G8929 Other chronic pain: Secondary | ICD-10-CM

## 2012-07-02 DIAGNOSIS — K7689 Other specified diseases of liver: Secondary | ICD-10-CM | POA: Diagnosis not present

## 2012-07-09 ENCOUNTER — Telehealth: Payer: Self-pay | Admitting: *Deleted

## 2012-07-09 NOTE — Telephone Encounter (Signed)
done

## 2012-07-09 NOTE — Progress Notes (Signed)
Pt already has ov with dr stark for 4-22--talked with pt

## 2012-07-11 ENCOUNTER — Other Ambulatory Visit: Payer: Self-pay | Admitting: Internal Medicine

## 2012-07-16 ENCOUNTER — Ambulatory Visit: Payer: Medicare Other | Admitting: Internal Medicine

## 2012-07-16 ENCOUNTER — Encounter: Payer: Self-pay | Admitting: Internal Medicine

## 2012-07-16 ENCOUNTER — Other Ambulatory Visit: Payer: Self-pay | Admitting: *Deleted

## 2012-07-16 MED ORDER — CYCLOBENZAPRINE HCL 10 MG PO TABS: 10.0000 mg | ORAL_TABLET | Freq: Three times a day (TID) | ORAL | Status: DC | PRN

## 2012-07-16 NOTE — Telephone Encounter (Signed)
Per dr Yvonne Kendall have flexeril 10 tid prn #30 with 0 refills-pt informed

## 2012-07-18 DIAGNOSIS — M4120 Other idiopathic scoliosis, site unspecified: Secondary | ICD-10-CM | POA: Diagnosis not present

## 2012-07-24 ENCOUNTER — Ambulatory Visit (INDEPENDENT_AMBULATORY_CARE_PROVIDER_SITE_OTHER): Payer: Medicare Other | Admitting: Gastroenterology

## 2012-07-24 ENCOUNTER — Encounter: Payer: Self-pay | Admitting: Gastroenterology

## 2012-07-24 VITALS — BP 138/70 | HR 74 | Ht 63.0 in | Wt 120.0 lb

## 2012-07-24 DIAGNOSIS — K219 Gastro-esophageal reflux disease without esophagitis: Secondary | ICD-10-CM | POA: Diagnosis not present

## 2012-07-24 DIAGNOSIS — R1319 Other dysphagia: Secondary | ICD-10-CM

## 2012-07-24 DIAGNOSIS — Z86010 Personal history of colonic polyps: Secondary | ICD-10-CM | POA: Diagnosis not present

## 2012-07-24 DIAGNOSIS — K3184 Gastroparesis: Secondary | ICD-10-CM

## 2012-07-24 MED ORDER — AMBULATORY NON FORMULARY MEDICATION: Status: DC

## 2012-07-24 NOTE — Progress Notes (Addendum)
History of Present Illness: This is a 79 year old female referred for evaluation of GERD and gastroparesis. She underwent upper endoscopy and colonoscopy in February 2011 for evaluation of GERD and iron deficiency anemia. Small adenomatous colon polyps were found. She states she was taking Nexium qhs and stopped taking Reglan. She noted worsening postprandial fullness and worsening reflux symptoms. She has occasional episodes of solid food dysphagia. She noted one brief episode of epigastric pain. Her last endoscopy did not show a stricture. Dr. Lovell Sheehan changed her medication regimen to Nexium every morning, ranitidine 300 mg every afternoon and restarted Reglan. Since the medication changes her symptoms have come under much better control. She noted a mild tremor when taking metoclopramide leading her to discontinue it. Since resuming Reglan a lower dosage she has only noted a mild tremor.  Review of Systems: Pertinent positive and negative review of systems were noted in the above HPI section. All other review of systems were otherwise negative.  Current Medications, Allergies, Past Medical History, Past Surgical History, Family History and Social History were reviewed in [employer] record.  Physical Exam: General: Well developed , well nourished, no acute distress Head: Normocephalic and atraumatic Eyes:  sclerae anicteric, EOMI Ears: Normal auditory acuity Mouth: No deformity or lesions Neck: Supple, no masses or thyromegaly Lungs: Clear throughout to auscultation Heart: Regular rate and rhythm; no murmurs, rubs or bruits Abdomen: Soft, non tender and non distended. No masses, hepatosplenomegaly or hernias noted. Normal Bowel sounds Musculoskeletal: Symmetrical with no gross deformities  Skin: No lesions on visible extremities Pulses:  Normal pulses noted Extremities: No clubbing, cyanosis, edema or deformities noted Neurological: Alert oriented x 4, grossly  nonfocal Cervical Nodes:  No significant cervical adenopathy Inguinal Nodes: No significant inguinal adenopathy Psychological:  Alert and cooperative. Normal mood and affect  Assessment and Recommendations:  1. GERD, dysphagia and gastroparesis. Continue Nexium 40 mg by mouth every morning and ranitidine 300 mg at bedtime. Trial of domperidone 10 mg twice a day in place of Reglan. If her symptoms are not adequately controlled consider omeprazole 40 mg twice a day with ranitidine 300 mg at bedtime. Dysphagia is likely secondary to poorly-controlled GERD and gastroparesis. Consider a barium esophagram and/or a repeat endoscopy if her symptoms are not controlled. Intensify all dietary and lifestyle measures for GERD and gastroparesis.  2. Personal history of adenomatous colon polyps. Surveillance colonoscopy recommended February 2016.

## 2012-07-24 NOTE — Patient Instructions (Addendum)
Stop taking Reglan.  We have faxed a prescription of Domperidone to [facility] for you to take one tablet by mouth twice daily but the prescription will say four times a day. They will contact you and mail the prescription to your home.   Gastroparesis diet brochure has been given to you.  Gastroparesis  Gastroparesis is also called slowed stomach emptying (delayed gastric emptying). It is a condition in which the stomach takes too long to empty its contents. It often happens in people with diabetes.  CAUSES  Gastroparesis happens when nerves to the stomach are damaged or stop working. When the nerves are damaged, the muscles of the stomach and intestines do not work normally. The movement of food is slowed or stopped. High blood glucose (sugar) causes changes in nerves and can damage the blood vessels that carry oxygen and nutrients to the nerves. RISK FACTORS  Diabetes.  Post-viral syndromes.  Eating disorders (anorexia, bulimia).  Surgery on the stomach or vagus nerve.  Gastroesophageal reflux disease (rarely).  Smooth muscle disorders (amyloidosis, scleroderma).  Metabolic disorders, including hypothyroidism.  Parkinson's disease. SYMPTOMS   Heartburn.  Feeling sick to your stomach (nausea).  Vomiting of undigested food.  An early feeling of fullness when eating.  Weight loss.  Abdominal bloating.  Erratic blood glucose levels.  Lack of appetite.  Gastroesophageal reflux.  Spasms of the stomach wall. Complications can include:  Bacterial overgrowth in stomach. Food stays in the stomach and can ferment and cause bacteria to grow.  Weight loss due to difficulty digesting and absorbing nutrients.  Vomiting.  Obstruction in the stomach. Undigested food can harden and cause nausea and vomiting.  Blood glucose fluctuations caused by inconsistent food absorption. DIAGNOSIS  The diagnosis of gastroparesis is confirmed through one or more of the  following tests:  Barium X-rays and scans. These tests look at how long it takes for food to move through the stomach.  Gastric manometry. This test measures electrical and muscular activity in the stomach. A thin tube is passed down the throat into the stomach. The tube contains a wire that takes measurements of the stomach's electrical and muscular activity as it digests liquids and solid food.  Endoscopy. This procedure is done with a long, thin tube called an endoscope. It is passed through the mouth and gently guides down the esophagus into the stomach. This tube helps the caregiver look at the lining of the stomach to check for any abnormalities.  Ultrasound. This can rule out gallbladder disease or pancreatitis. This test will outline and define the shape of the gallbladder and pancreas. TREATMENT   The primary treatment is to identify the problem and help control blood glucose levels. Treatments include:  Exercise.  Medicines to control nausea and vomiting.  Medicines to stimulate stomach muscles.  Changes in what and when you eat.  Having smaller meals more often.  Eating low-fiber forms of high-fiber foods, such aseating cooked vegetables instead of raw vegetables.  Eating low-fat foods.  Consuming liquids, which are easier to digest.  In severe cases, feeding tubes and intravenous (IV) feeding may be needed. It is important to note that in most cases, treatment does not cure gastroparesis. It is usually a lasting (chronic) condition. Treatment helps you manage the condition so that you can be as healthy and comfortable as possible. NEW TREATMENTS  A gastric neurostimulator has been developed to assist people with gastroparesis. The battery-operated device is surgically implanted. It emits mild electrical pulses to help improve  stomach emptying and to control nausea and vomiting.  The use of botulinum toxin has been shown to improve stomach emptying by decreasing the  prolonged contractions of the muscle between the stomach and the small intestine (pyloric sphincter). The benefits are temporary. SEEK MEDICAL CARE IF:   You are having problems keeping your blood glucose in goal range.  You are having nausea, vomiting, bloating, or early feelings of fullness with eating.  Your symptoms do not change with a change in diet. Document Released: 03/21/2005 Document Revised: 06/13/2011 Document Reviewed: 08/28/2008 [facility] Patient Information 2013 [place], [place].  Thank you for choosing me and [facility].  Venita Lick. Pleas Koch., MD., Clementeen Graham

## 2012-07-30 ENCOUNTER — Encounter (HOSPITAL_COMMUNITY): Payer: Self-pay | Admitting: Emergency Medicine

## 2012-07-30 ENCOUNTER — Telehealth: Payer: Self-pay | Admitting: Internal Medicine

## 2012-07-30 ENCOUNTER — Emergency Department (HOSPITAL_COMMUNITY): Payer: Medicare Other

## 2012-07-30 ENCOUNTER — Emergency Department (HOSPITAL_COMMUNITY)
Admission: EM | Admit: 2012-07-30 | Discharge: 2012-07-30 | Disposition: A | Discharge status: Home / Self Care | Payer: Medicare Other | Attending: Emergency Medicine | Admitting: Emergency Medicine

## 2012-07-30 DIAGNOSIS — S161XXA Strain of muscle, fascia and tendon at neck level, initial encounter: Secondary | ICD-10-CM

## 2012-07-30 DIAGNOSIS — I10 Essential (primary) hypertension: Secondary | ICD-10-CM | POA: Diagnosis not present

## 2012-07-30 DIAGNOSIS — M25569 Pain in unspecified knee: Secondary | ICD-10-CM | POA: Diagnosis not present

## 2012-07-30 DIAGNOSIS — Z8719 Personal history of other diseases of the digestive system: Secondary | ICD-10-CM | POA: Insufficient documentation

## 2012-07-30 DIAGNOSIS — Z8679 Personal history of other diseases of the circulatory system: Secondary | ICD-10-CM | POA: Insufficient documentation

## 2012-07-30 DIAGNOSIS — D649 Anemia, unspecified: Secondary | ICD-10-CM | POA: Insufficient documentation

## 2012-07-30 DIAGNOSIS — Z79899 Other long term (current) drug therapy: Secondary | ICD-10-CM | POA: Diagnosis not present

## 2012-07-30 DIAGNOSIS — Z860100 Personal history of colon polyps, unspecified: Secondary | ICD-10-CM | POA: Insufficient documentation

## 2012-07-30 DIAGNOSIS — E785 Hyperlipidemia, unspecified: Secondary | ICD-10-CM | POA: Insufficient documentation

## 2012-07-30 DIAGNOSIS — S8990XA Unspecified injury of unspecified lower leg, initial encounter: Secondary | ICD-10-CM | POA: Diagnosis not present

## 2012-07-30 DIAGNOSIS — Z9889 Other specified postprocedural states: Secondary | ICD-10-CM | POA: Insufficient documentation

## 2012-07-30 DIAGNOSIS — K219 Gastro-esophageal reflux disease without esophagitis: Secondary | ICD-10-CM | POA: Diagnosis not present

## 2012-07-30 DIAGNOSIS — S139XXA Sprain of joints and ligaments of unspecified parts of neck, initial encounter: Secondary | ICD-10-CM | POA: Diagnosis not present

## 2012-07-30 DIAGNOSIS — Z9089 Acquired absence of other organs: Secondary | ICD-10-CM | POA: Diagnosis not present

## 2012-07-30 DIAGNOSIS — F411 Generalized anxiety disorder: Secondary | ICD-10-CM | POA: Diagnosis not present

## 2012-07-30 DIAGNOSIS — S99929A Unspecified injury of unspecified foot, initial encounter: Secondary | ICD-10-CM | POA: Diagnosis not present

## 2012-07-30 DIAGNOSIS — Y9389 Activity, other specified: Secondary | ICD-10-CM | POA: Insufficient documentation

## 2012-07-30 DIAGNOSIS — Y92410 Unspecified street and highway as the place of occurrence of the external cause: Secondary | ICD-10-CM | POA: Insufficient documentation

## 2012-07-30 DIAGNOSIS — S0993XA Unspecified injury of face, initial encounter: Secondary | ICD-10-CM | POA: Diagnosis not present

## 2012-07-30 DIAGNOSIS — M542 Cervicalgia: Secondary | ICD-10-CM | POA: Diagnosis not present
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
## 2012-08-06 DIAGNOSIS — Z1231 Encounter for screening mammogram for malignant neoplasm of breast: Secondary | ICD-10-CM | POA: Diagnosis not present

## 2012-08-07 NOTE — ED Provider Notes (Signed)
Medical screening examination/treatment/procedure(s) were performed by non-physician practitioner and as supervising physician I was immediately available for consultation/collaboration.  Raeford Razor, MD 08/07/12 2256

## 2012-08-13 ENCOUNTER — Other Ambulatory Visit: Payer: Self-pay | Admitting: Internal Medicine

## 2012-08-20 ENCOUNTER — Other Ambulatory Visit: Payer: Self-pay | Admitting: Internal Medicine

## 2012-09-07 DIAGNOSIS — Z01419 Encounter for gynecological examination (general) (routine) without abnormal findings: Secondary | ICD-10-CM | POA: Diagnosis not present

## 2012-09-07 DIAGNOSIS — Z124 Encounter for screening for malignant neoplasm of cervix: Secondary | ICD-10-CM | POA: Diagnosis not present

## 2012-09-12 ENCOUNTER — Telehealth: Payer: Self-pay | Admitting: Gastroenterology

## 2012-09-12 NOTE — Telephone Encounter (Signed)
Per procedure report and pathology report patient is due for 05/2014 for a repeat colonoscopy.  Her GYN wanted her to check with [place] to confirm.

## 2012-10-15 ENCOUNTER — Ambulatory Visit: Payer: Medicare Other | Admitting: Internal Medicine

## 2012-10-25 DIAGNOSIS — L723 Sebaceous cyst: Secondary | ICD-10-CM | POA: Diagnosis not present

## 2012-11-07 ENCOUNTER — Other Ambulatory Visit: Payer: Self-pay

## 2012-11-08 ENCOUNTER — Telehealth: Payer: Self-pay | Admitting: Gastroenterology

## 2012-11-08 MED ORDER — AMBULATORY NON FORMULARY MEDICATION: Status: DC

## 2012-11-08 NOTE — Telephone Encounter (Signed)
Informed patient that we no longer get it from [place] pharmacies and that we now send the prescription to [facility] for them to fill and mail to her. Told patient I will fax prescription to [place] and they should contact the patient for payment and mailing address. Pt agreed and prescription faxed. Told patient to call us if she does not get a phone call.

## 2012-11-27 ENCOUNTER — Telehealth: Payer: Self-pay

## 2012-11-27 DIAGNOSIS — R1319 Other dysphagia: Secondary | ICD-10-CM

## 2012-11-27 NOTE — Telephone Encounter (Signed)
Patient advised.  BS scheduled for 11/29/12 11:00 at [facility].  She is aware to arrive at 10:45 in Radiology

## 2012-11-27 NOTE — Telephone Encounter (Signed)
Message copied by Annett Fabian on Tue Nov 27, 2012  1:57 PM ------      Message from: Claudette Head T      Created: Tue Nov 27, 2012  1:29 PM       Pt returning for dysphagia, gerd gastroparesis. Per my last office note please order a BA esophagram before her upcoming appt. ------

## 2012-11-29 ENCOUNTER — Ambulatory Visit (HOSPITAL_COMMUNITY)
Admission: RE | Admit: 2012-11-29 | Discharge: 2012-11-29 | Disposition: A | Discharge status: Home / Self Care | Payer: Medicare Other | Source: Ambulatory Visit | Attending: Gastroenterology | Admitting: Gastroenterology

## 2012-11-29 DIAGNOSIS — K224 Dyskinesia of esophagus: Secondary | ICD-10-CM | POA: Diagnosis not present

## 2012-11-29 DIAGNOSIS — R490 Dysphonia: Secondary | ICD-10-CM | POA: Diagnosis not present

## 2012-11-29 DIAGNOSIS — R1310 Dysphagia, unspecified: Secondary | ICD-10-CM | POA: Diagnosis not present

## 2012-11-29 DIAGNOSIS — K225 Diverticulum of esophagus, acquired: Secondary | ICD-10-CM | POA: Insufficient documentation

## 2012-11-29 DIAGNOSIS — R1319 Other dysphagia: Secondary | ICD-10-CM

## 2012-12-04 DIAGNOSIS — L905 Scar conditions and fibrosis of skin: Secondary | ICD-10-CM | POA: Diagnosis not present

## 2012-12-07 ENCOUNTER — Encounter: Payer: Self-pay | Admitting: Gastroenterology

## 2012-12-07 ENCOUNTER — Ambulatory Visit (INDEPENDENT_AMBULATORY_CARE_PROVIDER_SITE_OTHER): Payer: Medicare Other | Admitting: Gastroenterology

## 2012-12-07 ENCOUNTER — Other Ambulatory Visit: Payer: Self-pay | Admitting: Internal Medicine

## 2012-12-07 VITALS — BP 132/72 | HR 60 | Ht 63.5 in | Wt 123.0 lb

## 2012-12-07 DIAGNOSIS — K3184 Gastroparesis: Secondary | ICD-10-CM

## 2012-12-07 DIAGNOSIS — K225 Diverticulum of esophagus, acquired: Secondary | ICD-10-CM

## 2012-12-07 DIAGNOSIS — R490 Dysphonia: Secondary | ICD-10-CM

## 2012-12-07 DIAGNOSIS — K219 Gastro-esophageal reflux disease without esophagitis: Secondary | ICD-10-CM | POA: Diagnosis not present

## 2012-12-07 MED ORDER — ESOMEPRAZOLE MAGNESIUM 40 MG PO CPDR: 40.0000 mg | DELAYED_RELEASE_CAPSULE | Freq: Two times a day (BID) | ORAL | Status: DC

## 2012-12-07 NOTE — Patient Instructions (Addendum)
Please increase your Nexium to one tablet by mouth twice daily before breakfast and dinner. A new prescription has been sent to your pharmacy.  Continue your Zantac at bedtime.  We have scheduled you an appointment at [facility] ENT with Dr. Pollyann Kennedy on 12/07/12 at 10:30am   . If you need to reschedule or cancel please call there office at [phone]. Please bring a current list of medications and your co-pay. The location of there practice is  [address]. This is close to [facility].   Thank you for choosing me and [facility].  Venita Lick. Pleas Koch., MD., Clementeen Graham

## 2012-12-07 NOTE — Progress Notes (Signed)
History of Present Illness: This is a 79 year old female with chronic GERD, gastroparesis, dysphagia and hoarseness. She recently underwent a barium esophagram which showed a small Zenker's diverticulum and esophageal dysmotility, barium tablet passed without difficulty, no reflux was noted. She is particularly concerned about her persistent hoarseness. She has not had an ENT evaluation. She underwent upper endoscopy in 2011 was unremarkable.  Current Medications, Allergies, Past Medical History, Past Surgical History, Family History and Social History were reviewed in [employer] record.  Physical Exam: General: Well developed , well nourished, no acute distress Head: Normocephalic and atraumatic Eyes:  sclerae anicteric, EOMI Ears: Normal auditory acuity Mouth: No deformity or lesions Lungs: Clear throughout to auscultation Heart: Regular rate and rhythm; no murmurs, rubs or bruits Abdomen: Soft, non tender and non distended. No masses, hepatosplenomegaly or hernias noted. Normal Bowel sounds Musculoskeletal: Symmetrical with no gross deformities  Pulses:  Normal pulses noted Extremities: No clubbing, cyanosis, edema or deformities noted Neurological: Alert oriented x 4, grossly nonfocal Psychological:  Alert and cooperative. Normal mood and affect  Assessment and Recommendations:  1. GERD, dysphagia, hoarseness, gastroparesis, small Zenker's diverticulum, esophageal dysmotility. Increase Nexium to 40 mg by mouth twice a day before breakfast and dinner and continue ranitidine 300 mg at bedtime. Continue domperidone 10 mg twice a day. Intensify all dietary and lifestyle measures for GERD and gastroparesis. Dysphagia is likely secondary to a motility disorder. ENT referral for further evaluation of hoarseness which may be secondary to LPR and a small Zenker's diverticulum.  2. Personal history of adenomatous colon polyps. Surveillance colonoscopy recommended February  2016.

## 2012-12-19 ENCOUNTER — Ambulatory Visit (INDEPENDENT_AMBULATORY_CARE_PROVIDER_SITE_OTHER): Payer: Medicare Other | Admitting: Internal Medicine

## 2012-12-19 ENCOUNTER — Encounter: Payer: Self-pay | Admitting: Internal Medicine

## 2012-12-19 VITALS — BP 130/82 | HR 72 | Temp 98.6°F | Resp 16 | Ht 63.5 in | Wt 122.0 lb

## 2012-12-19 DIAGNOSIS — I10 Essential (primary) hypertension: Secondary | ICD-10-CM | POA: Diagnosis not present

## 2012-12-19 DIAGNOSIS — E785 Hyperlipidemia, unspecified: Secondary | ICD-10-CM

## 2012-12-19 DIAGNOSIS — Z23 Encounter for immunization: Secondary | ICD-10-CM | POA: Diagnosis not present

## 2012-12-19 DIAGNOSIS — M48061 Spinal stenosis, lumbar region without neurogenic claudication: Secondary | ICD-10-CM | POA: Diagnosis not present

## 2012-12-19 DIAGNOSIS — D509 Iron deficiency anemia, unspecified: Secondary | ICD-10-CM | POA: Diagnosis not present

## 2012-12-19 DIAGNOSIS — M810 Age-related osteoporosis without current pathological fracture: Secondary | ICD-10-CM | POA: Diagnosis not present

## 2012-12-19 DIAGNOSIS — Z0000 Encounter for general adult medical examination without abnormal findings: Secondary | ICD-10-CM

## 2012-12-19 DIAGNOSIS — E039 Hypothyroidism, unspecified: Secondary | ICD-10-CM

## 2012-12-19 LAB — BASIC METABOLIC PANEL
BUN: 11 mg/dL (ref 6–23)
CO2: 27 mEq/L (ref 19–32)
Calcium: 9.6 mg/dL (ref 8.4–10.5)
Chloride: 97 mEq/L (ref 96–112)
Creatinine, Ser: 0.9 mg/dL (ref 0.4–1.2)
GFR: 62 mL/min (ref >60.00)
Glucose, Bld: 90 mg/dL (ref 70–99)
Potassium: 4.3 mEq/L (ref 3.5–5.1)
Sodium: 131 mEq/L — ABNORMAL LOW (ref 135–145)

## 2012-12-19 LAB — CBC WITH DIFFERENTIAL/PLATELET
Basophils Absolute: 0.1 10*3/uL (ref 0.0–0.1)
Basophils Relative: 0.6 % (ref 0.0–3.0)
Eosinophils Absolute: 0.3 10*3/uL (ref 0.0–0.7)
Eosinophils Relative: 2.5 % (ref 0.0–5.0)
HCT: 36.8 % (ref 36.0–46.0)
Hemoglobin: 12.5 g/dL (ref 12.0–15.0)
Lymphocytes Relative: 20.5 % (ref 12.0–46.0)
Lymphs Abs: 2.4 10*3/uL (ref 0.7–4.0)
MCHC: 34.1 g/dL (ref 30.0–36.0)
MCV: 96.4 fl (ref 78.0–100.0)
Monocytes Absolute: 1.2 10*3/uL — ABNORMAL HIGH (ref 0.1–1.0)
Monocytes Relative: 10.4 % (ref 3.0–12.0)
Neutro Abs: 7.7 10*3/uL (ref 1.4–7.7)
Neutrophils Relative %: 66 % (ref 43.0–77.0)
Platelets: 265 10*3/uL (ref 150.0–400.0)
RBC: 3.81 Mil/uL — ABNORMAL LOW (ref 3.87–5.11)
RDW: 12.7 % (ref 11.5–14.6)
WBC: 11.7 10*3/uL — ABNORMAL HIGH (ref 4.5–10.5)

## 2012-12-19 LAB — IRON: Iron: 60 ug/dL (ref 42–145)

## 2012-12-19 LAB — HEPATIC FUNCTION PANEL
ALT: 16 U/L (ref 0–35)
AST: 18 U/L (ref 0–37)
Albumin: 4 g/dL (ref 3.5–5.2)
Alkaline Phosphatase: 73 U/L (ref 39–117)
Bilirubin, Direct: 0.1 mg/dL (ref 0.0–0.3)
Total Bilirubin: 0.6 mg/dL (ref 0.3–1.2)
Total Protein: 7.1 g/dL (ref 6.0–8.3)

## 2012-12-19 LAB — LIPID PANEL
Cholesterol: 180 mg/dL (ref 0–200)
HDL: 44.8 mg/dL (ref >39.00)
LDL Cholesterol: 97 mg/dL (ref 0–99)
Total CHOL/HDL Ratio: 4
Triglycerides: 191 mg/dL — ABNORMAL HIGH (ref 0.0–149.0)
VLDL: 38.2 mg/dL (ref 0.0–40.0)

## 2012-12-19 LAB — TSH: TSH: 3.59 u[IU]/mL (ref 0.35–5.50)

## 2012-12-19 NOTE — Progress Notes (Signed)
Subjective:    Patient ID: Rebecca [name], female    DOB: [birth year], 79 y.o.   MRN: [id number]  HPI Wearing hearing aids Patient is a 79 year old female who is followed for history of iron deficiency anemia and history of intermittent claudication or lower extremities due to mild arthritic sclerosis a history of GERD with associated mild gastroparesis a history of hypertension hypothyroidism and a history of spinal stenosis followed by neurosurgery.  Her spinal nerve damage has resulted in lower extremity neuropathy.   Review of Systems  Constitutional: Positive for fever. Negative for activity change, appetite change and fatigue.  HENT: Positive for congestion and rhinorrhea. Negative for ear pain, neck pain, postnasal drip and sinus pressure.   Eyes: Negative for redness and visual disturbance.  Respiratory: Negative for cough, shortness of breath and wheezing.   Cardiovascular: Negative for leg swelling.  Gastrointestinal: Negative for abdominal pain and abdominal distention.  Endocrine: Negative.   Genitourinary: Negative.  Negative for dysuria, frequency and menstrual problem.  Musculoskeletal: Negative for myalgias, joint swelling and arthralgias.  Skin: Negative for rash and wound.  Allergic/Immunologic: Negative.   Neurological: Positive for dizziness. Negative for weakness and headaches.  Hematological: Negative for adenopathy. Does not bruise/bleed easily.  Psychiatric/Behavioral: Negative for sleep disturbance and decreased concentration.       Past Medical History  Diagnosis Date  . Hypertension   . Diverticulosis   . Esophageal stricture   . Anxiety   . Hemorrhoids   . Gastroparesis   . Anemia   . Hyperlipidemia   . Rectal ulcer   . Diverticulitis   . Tubular adenoma of colon 05/2009  . GERD (gastroesophageal reflux disease)   . Hypertension   . IBS (irritable bowel syndrome)     History   Social History  . Marital Status: Widowed    Spouse Name:  N/A    Number of Children: N/A  . Years of Education: N/A   Occupational History  . Not on file.   Social History Main Topics  . Smoking status: Never Smoker   . Smokeless tobacco: Never Used  . Alcohol Use: Yes     Comment: rare  . Drug Use: No  . Sexual Activity: Yes   Other Topics Concern  . Not on file   Social History Narrative  . No narrative on file    Past Surgical History  Procedure Laterality Date  . Tonsillectomy    . Appendectomy    . Arcuate keratectomy    . Knee surgery Bilateral   . Hernia repair      umbilical  . Double pallital tori bone removed     . Morton's neuroma on rt foot and platar facial release    . Mouth surgery      "bone shaved off roof of mouth"  . Salivary gland surgery      removal    Family History  Problem Relation Age of Onset  . Crohn's disease Neg Hx   . Migraines Daughter   . Colon polyps Sister   . Lung cancer Sister   . Colon polyps Brother   . Diabetes Brother   . Hyperlipidemia Brother   . Hypertension Brother   . Colon polyps Daughter   . Breast cancer Daughter   . Colon cancer Maternal Aunt   . Diabetes Mother   . Heart disease Mother   . Hyperlipidemia Mother   . Hypertension Mother   . Cancer Daughter     salivary  .  Anuerysm Father   . Heart disease Father   . Hypertension Father   . Colon cancer Paternal Grandmother   . Heart disease Brother     Allergies  Allergen Reactions  . Ciprofloxacin     REACTION: Thrush  . Codeine   . Pneumococcal Vaccine Polyvalent     REACTION: RED AND RAISED RASH ON ARM  . Sulfamethoxazole W-Trimethoprim   . Tramadol Hcl   . Adhesive [Tape] Rash    Current Outpatient Prescriptions on File Prior to Visit  Medication Sig Dispense Refill  . ALPRAZolam (XANAX) 0.25 MG tablet take 1 tablet by mouth once daily if needed  30 tablet  0  . AMBULATORY NON FORMULARY MEDICATION Medication Name: Domperidone 10 mg one tablet by mouth four times a day with meals and at bedtime   120 tablet  5  . Calcium Carbonate-Vitamin D (CALCIUM-VITAMIN D) 500-200 MG-UNIT per tablet Take 1 tablet by mouth 2 (two) times daily with a meal.        . Cholecalciferol (VITAMIN D3) 2000 UNITS TABS Take 1 tablet by mouth every morning.       [name] Kitchen esomeprazole (NEXIUM) 40 MG capsule Take 1 capsule (40 mg total) by mouth 2 (two) times daily.  60 capsule  11  . ezetimibe-simvastatin (VYTORIN) 10-20 MG per tablet Take 1 tablet by mouth every morning.       . ferrous sulfate 325 (65 FE) MG tablet Take 325 mg by mouth daily with breakfast.      . gabapentin (NEURONTIN) 100 MG capsule take 1 capsule by mouth at bedtime if needed  30 capsule  3  . levothyroxine (SYNTHROID, LEVOTHROID) 88 MCG tablet Take 88 mcg by mouth every morning.      . ranitidine (ZANTAC) 300 MG tablet Take 1 tablet (300 mg total) by mouth at bedtime.  30 tablet  11  . vitamin B-12 (CYANOCOBALAMIN) 500 MCG tablet Take 500 mcg by mouth every morning.       . [DISCONTINUED] olmesartan (BENICAR) 20 MG tablet Take 20 mg by mouth daily.         No current facility-administered medications on file prior to visit.    BP 130/82  Pulse 72  Temp(Src) 98.6 F (37 C)  Resp 16  Ht 5' 3.5" (1.613 m)  Wt 122 lb (55.339 kg)  BMI 21.27 kg/m2    Objective:   Physical Exam  Nursing note and vitals reviewed. Constitutional: She is oriented to person, place, and time. She appears well-developed. No distress.  HENT:  Head: Normocephalic and atraumatic.  Mouth/Throat: Oropharynx is clear and moist.  Eyes: Conjunctivae and EOM are normal. Pupils are equal, round, and reactive to light.  Neck: Normal range of motion. Neck supple. No JVD present. No tracheal deviation present. No thyromegaly present.  Cardiovascular: Normal rate and regular rhythm.   Murmur heard. Pulmonary/Chest: Effort normal and breath sounds normal. She has no wheezes. She exhibits no tenderness.  Abdominal: Soft. Bowel sounds are normal.  Musculoskeletal: She  exhibits edema and tenderness.  Lymphadenopathy:    She has no cervical adenopathy.  Neurological: She is alert and oriented to person, place, and time. She has normal reflexes. She displays normal reflexes. No cranial nerve deficit.  Skin: Skin is warm and dry. She is not diaphoretic.  Psychiatric: She has a normal mood and affect. Her behavior is normal.          Assessment & Plan:  In addition to a Medicare wellness examination today  we will monitor on iron a CBC for history of iron deficiency anemia a lipid and liver for monitoring for cholesterol treatment a basic metabolic panel for monitoring of potassium and renal function and a TSH due to her history of hypothyroidism primary    She has not had a vitamin D or vitamin B12 this year so we will monitor a vitamin D and a vitamin B12 level Subjective:    Rebecca [name] is a 79 y.o. female who presents for Medicare Annual/Subsequent preventive examination.  Preventive Screening-Counseling & Management  Tobacco History  Smoking status  . Never Smoker   Smokeless tobacco  . Never Used     Problems Prior to Visit 1.   Current Problems (verified) Patient Active Problem List   Diagnosis Date Noted  . 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 Atherosclerosis 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 04/20/2011  . SPINAL STENOSIS, LUMBAR 03/09/2010  . OTHER OSTEOPOROSIS 03/09/2010  . SCOLIOSIS, LUMBAR SPINE 03/09/2010  . MIGRAINE, OPHTHALMIC 10/29/2009  . LUMBAR RADICULOPATHY, ATYPICAL 10/29/2009  . POLYNEUROPATHY OTHER DISEASES CLASSIFIED ELSW 09/22/2009  . UNSPECIFIED PERIPHERAL VASCULAR DISEASE 09/22/2009  . ADENOMATOUS COLONIC POLYP 06/10/2009  . ANEMIA, IRON DEFICIENCY 05/06/2009  . CAROTID ARTERY DISEASE 07/28/2008  . GASTROPARESIS 07/22/2008  . ALLERGIC RHINITIS CAUSE UNSPECIFIED 03/20/2008  . DYSPHAGIA 03/12/2008  . GANGLION OF TENDON SHEATH 08/17/2007  . HYPOTHYROIDISM, PRIMARY  11/15/2006  . HYPERLIPIDEMIA 11/07/2006  . ADVEF, DRUG/MEDICINAL/BIOLOGICAL SUBST NOS 11/07/2006  . HYPERTENSION 10/18/2006  . GERD 10/18/2006  . DIVERTICULOSIS, COLON 10/18/2006    Medications Prior to Visit Current Outpatient Prescriptions on File Prior to Visit  Medication Sig Dispense Refill  . ALPRAZolam (XANAX) 0.25 MG tablet take 1 tablet by mouth once daily if needed  30 tablet  0  . AMBULATORY NON FORMULARY MEDICATION Medication Name: Domperidone 10 mg one tablet by mouth four times a day with meals and at bedtime  120 tablet  5  . Calcium Carbonate-Vitamin D (CALCIUM-VITAMIN D) 500-200 MG-UNIT per tablet Take 1 tablet by mouth 2 (two) times daily with a meal.        . Cholecalciferol (VITAMIN D3) 2000 UNITS TABS Take 1 tablet by mouth every morning.       [name] Kitchen esomeprazole (NEXIUM) 40 MG capsule Take 1 capsule (40 mg total) by mouth 2 (two) times daily.  60 capsule  11  . ezetimibe-simvastatin (VYTORIN) 10-20 MG per tablet Take 1 tablet by mouth every morning.       . ferrous sulfate 325 (65 FE) MG tablet Take 325 mg by mouth daily with breakfast.      . gabapentin (NEURONTIN) 100 MG capsule take 1 capsule by mouth at bedtime if needed  30 capsule  3  . levothyroxine (SYNTHROID, LEVOTHROID) 88 MCG tablet Take 88 mcg by mouth every morning.      . ranitidine (ZANTAC) 300 MG tablet Take 1 tablet (300 mg total) by mouth at bedtime.  30 tablet  11  . vitamin B-12 (CYANOCOBALAMIN) 500 MCG tablet Take 500 mcg by mouth every morning.       . [DISCONTINUED] olmesartan (BENICAR) 20 MG tablet Take 20 mg by mouth daily.         No current facility-administered medications on file prior to visit.    Current Medications (verified) Current Outpatient Prescriptions  Medication Sig Dispense Refill  . ALPRAZolam (XANAX) 0.25 MG tablet take 1 tablet by mouth once daily if needed  30 tablet  0  .  AMBULATORY NON FORMULARY MEDICATION Medication Name: Domperidone 10 mg one tablet by mouth four times  a day with meals and at bedtime  120 tablet  5  . Calcium Carbonate-Vitamin D (CALCIUM-VITAMIN D) 500-200 MG-UNIT per tablet Take 1 tablet by mouth 2 (two) times daily with a meal.        . Cholecalciferol (VITAMIN D3) 2000 UNITS TABS Take 1 tablet by mouth every morning.       [name] Kitchen esomeprazole (NEXIUM) 40 MG capsule Take 1 capsule (40 mg total) by mouth 2 (two) times daily.  60 capsule  11  . ezetimibe-simvastatin (VYTORIN) 10-20 MG per tablet Take 1 tablet by mouth every morning.       . ferrous sulfate 325 (65 FE) MG tablet Take 325 mg by mouth daily with breakfast.      . gabapentin (NEURONTIN) 100 MG capsule take 1 capsule by mouth at bedtime if needed  30 capsule  3  . levothyroxine (SYNTHROID, LEVOTHROID) 88 MCG tablet Take 88 mcg by mouth every morning.      [name] Kitchen losartan (COZAAR) 100 MG tablet Take 100 mg by mouth daily.      . ranitidine (ZANTAC) 300 MG tablet Take 1 tablet (300 mg total) by mouth at bedtime.  30 tablet  11  . vitamin B-12 (CYANOCOBALAMIN) 500 MCG tablet Take 500 mcg by mouth every morning.       . [DISCONTINUED] olmesartan (BENICAR) 20 MG tablet Take 20 mg by mouth daily.         No current facility-administered medications for this visit.     Allergies (verified) Ciprofloxacin; Codeine; Pneumococcal vaccine polyvalent; Sulfamethoxazole w-trimethoprim; Tramadol hcl; and Adhesive   PAST HISTORY  Family History Family History  Problem Relation Age of Onset  . Crohn's disease Neg Hx   . Migraines Daughter   . Colon polyps Sister   . Lung cancer Sister   . Colon polyps Brother   . Diabetes Brother   . Hyperlipidemia Brother   . Hypertension Brother   . Colon polyps Daughter   . Breast cancer Daughter   . Colon cancer Maternal Aunt   . Diabetes Mother   . Heart disease Mother   . Hyperlipidemia Mother   . Hypertension Mother   . Cancer Daughter     salivary  . Anuerysm Father   . Heart disease Father   . Hypertension Father   . Colon cancer Paternal  Grandmother   . Heart disease Brother     Social History History  Substance Use Topics  . Smoking status: Never Smoker   . Smokeless tobacco: Never Used  . Alcohol Use: Yes     Comment: rare     Are there smokers in your home (other than you)? No  Risk Factors Current exercise habits: The patient does not participate in regular exercise at present.  Dietary issues discussed: GERD   Cardiac risk factors: advanced age (older than 80 for men, 86 for women) and hypertension.  Depression Screen (Note: if answer to either of the following is "Yes", a more complete depression screening is indicated)   Over the past two weeks, have you felt down, depressed or hopeless? No  Over the past two weeks, have you felt little interest or pleasure in doing things? No  Have you lost interest or pleasure in daily life? No  Do you often feel hopeless? No  Do you cry easily over simple problems? No  Activities of Daily Living In your present state  of health, do you have any difficulty performing the following activities?:  Driving? No Managing money?  No Feeding yourself? No Getting from bed to chair? No Climbing a flight of stairs? No Preparing food and eating?: No Bathing or showering? No Getting dressed: No Getting to the toilet? No Using the toilet:No Moving around from place to place: No In the past year have you fallen or had a near fall?:No   Are you sexually active?  No  Do you have more than one partner?  No  Hearing Difficulties: No Do you often ask people to speak up or repeat themselves? No Do you experience ringing or noises in your ears? Yes Do you have difficulty understanding soft or whispered voices? Yes   Do you feel that you have a problem with memory? No  Do you often misplace items? No  Do you feel safe at home?  Yes  Cognitive Testing  Alert? Yes  Normal Appearance?Yes  Oriented to person? Yes  Place? Yes   Time? Yes  Recall of three objects?  Yes  Can  perform simple calculations? Yes  Displays appropriate judgment?Yes  Can read the correct time from a watch face?Yes   Advanced Directives have been discussed with the patient? Yes  List the Names of Other Physician/Practitioners you currently use: 1.    Indicate any recent Medical Services you may have received from other than [facility] providers in the past year (date may be approximate).  Immunization History  Administered Date(s) Administered  . H1N1 06/02/2008  . Influenza Split 12/29/2010  . Influenza Whole 04/04/2001, 02/01/2007, 12/24/2007, 12/16/2008, 01/15/2009, 01/05/2010  . Pneumococcal Polysaccharide 03/05/1999  . Td 04/05/1999, 07/28/2008  . Zoster 10/24/2007    Screening Tests Health Maintenance  Topic Date Due  . Pneumococcal Polysaccharide Vaccine Age 34 And Over  02/20/2000  . Influenza Vaccine  11/02/2012  . Tetanus/tdap  07/29/2018  . Colonoscopy  05/13/2019  . Zostavax  Completed    All answers were reviewed with the patient and necessary referrals were made:  Carrie Mew, MD   12/19/2012   History reviewed: allergies, current medications, past family history, past medical history, past social history, past surgical history and problem list  Review of Systems Pertinent items are noted in HPI.    Objective:     Vision by Snellen chart: right eye:20/20, left eye:20/20  Body mass index is 21.27 kg/(m^2). BP 130/82  Pulse 72  Temp(Src) 98.6 F (37 C)  Resp 16  Ht 5' 3.5" (1.613 m)  Wt 122 lb (55.339 kg)  BMI 21.27 kg/m2 Exam per problem focused documentation     Assessment:      This is a routine physical examination for this healthy  Female. Reviewed all health maintenance protocols including mammography colonoscopy bone density and reviewed appropriate screening labs. Her immunization history was reviewed as well as her current medications and allergies refills of her chronic medications were given and the plan for yearly health  maintenance was discussed all orders and referrals were made as appropriate.       Plan:     During the course of the visit the patient was educated and counseled about appropriate screening and preventive services including:    Pneumococcal vaccine   Influenza vaccine  Screening mammography  Nutrition counseling   Diet review for nutrition referral? Yes ___x_  Not Indicated ____   Patient Instructions (the written plan) was given to the patient.  Medicare Attestation I have personally reviewed: The patient's medical  and social history Their use of alcohol, tobacco or illicit drugs Their current medications and supplements The patient's functional ability including ADLs,fall risks, home safety risks, cognitive, and hearing and visual impairment Diet and physical activities Evidence for depression or mood disorders  The patient's weight, height, BMI, and visual acuity have been recorded in the chart.  I have made referrals, counseling, and provided education to the patient based on review of the above and I have provided the patient with a written personalized care plan for preventive services.     Carrie Mew, MD   12/19/2012

## 2012-12-19 NOTE — Patient Instructions (Signed)
Change to [employer]cel

## 2012-12-20 LAB — VITAMIN D 25 HYDROXY (VIT D DEFICIENCY, FRACTURES): Vit D, 25-Hydroxy: 54 ng/mL (ref 30–89)

## 2012-12-21 LAB — METHYLMALONIC ACID, SERUM: Methylmalonic Acid, Quant: 0.12 umol/L (ref <0.40)

## 2012-12-31 ENCOUNTER — Other Ambulatory Visit: Payer: Self-pay | Admitting: Internal Medicine

## 2013-01-07 ENCOUNTER — Encounter: Payer: Self-pay | Admitting: Internal Medicine

## 2013-01-11 ENCOUNTER — Other Ambulatory Visit: Payer: Self-pay | Admitting: Internal Medicine

## 2013-01-15 DIAGNOSIS — Z961 Presence of intraocular lens: Secondary | ICD-10-CM | POA: Diagnosis not present

## 2013-01-15 DIAGNOSIS — H52209 Unspecified astigmatism, unspecified eye: Secondary | ICD-10-CM | POA: Diagnosis not present

## 2013-02-07 ENCOUNTER — Ambulatory Visit (INDEPENDENT_AMBULATORY_CARE_PROVIDER_SITE_OTHER): Payer: Medicare Other | Admitting: Gastroenterology

## 2013-02-07 ENCOUNTER — Encounter: Payer: Self-pay | Admitting: Gastroenterology

## 2013-02-07 VITALS — BP 116/64 | HR 76 | Ht 62.25 in | Wt 126.2 lb

## 2013-02-07 DIAGNOSIS — R141 Gas pain: Secondary | ICD-10-CM

## 2013-02-07 DIAGNOSIS — K219 Gastro-esophageal reflux disease without esophagitis: Secondary | ICD-10-CM | POA: Diagnosis not present

## 2013-02-07 DIAGNOSIS — K3184 Gastroparesis: Secondary | ICD-10-CM

## 2013-02-07 NOTE — Patient Instructions (Signed)
You have been given a Low gas diet.   Use over the counter Gas-X four times a day as needed for gas and bloating.   You can try another probiotic in the place of Align such as Florastor and Flora-Q over the counter.   Thank you for choosing me and [facility].  Venita Lick. Pleas Koch., MD., Clementeen Graham

## 2013-02-07 NOTE — Progress Notes (Signed)
    History of Present Illness: This is a 79 year old female who returns for followup of GERD and gastroparesis. She underwent ENT evaluation by Dr. Pollyann Kennedy. I reviewed his office note. She states that Domperidone caused abdominal pain so she discontinued it. She resumed metoclopramide 5 mg taken before her evening meal and at bedtime and she states this has slightly helped her symptoms. She still has a persistent mild hoarseness and notes occasional reflux symptoms. She complains of intestinal gas that is partially responsive to the use of Gas-X.   Lead to worsening abdominal pain and worsening  Current Medications, Allergies, Past Medical History, Past Surgical History, Family History and Social History were reviewed in [employer] record.  Physical Exam: General: Well developed , well nourished, no acute distress Head: Normocephalic and atraumatic Eyes:  sclerae anicteric, EOMI Ears: Normal auditory acuity Mouth: No deformity or lesions Lungs: Clear throughout to auscultation Heart: Regular rate and rhythm; no murmurs, rubs or bruits Abdomen: Soft, non tender and non distended. No masses, hepatosplenomegaly or hernias noted. Normal Bowel sounds Musculoskeletal: Symmetrical with no gross deformities  Pulses:  Normal pulses noted Extremities: No clubbing, cyanosis, edema or deformities noted Neurological: Alert oriented x 4, grossly nonfocal Psychological:  Alert and cooperative. Flat  affect  Assessment and Recommendations:  1. GERD, hoarseness, gastroparesis, dysphagia, small Zenker's diverticulum, esophageal dysmotility. Continue  Nexium to 40 mg by mouth twice a day before breakfast and dinner and continue ranitidine 300 mg at bedtime. Continue metoclopramide 5 mg twice a day. Intensify all dietary and lifestyle measures for GERD and gastroparesis. Dysphagia is likely secondary to a motility disorder.   2. Personal history of adenomatous colon polyps. Surveillance  colonoscopy recommended February 2016.   3. Intestinal gas. Begin a low gas diet. Continue Gas-X 4 times a day when necessary. Trial of several different probiotics for a few weeks at a time to see if they positively impact her symptoms.

## 2013-02-14 ENCOUNTER — Ambulatory Visit (HOSPITAL_COMMUNITY): Payer: Medicare Other | Attending: Cardiology

## 2013-02-14 ENCOUNTER — Encounter: Payer: Self-pay | Admitting: Cardiology

## 2013-02-14 DIAGNOSIS — I658 Occlusion and stenosis of other precerebral arteries: Secondary | ICD-10-CM | POA: Diagnosis not present

## 2013-02-14 DIAGNOSIS — I10 Essential (primary) hypertension: Secondary | ICD-10-CM | POA: Diagnosis not present

## 2013-02-14 DIAGNOSIS — I6529 Occlusion and stenosis of unspecified carotid artery: Secondary | ICD-10-CM

## 2013-02-14 DIAGNOSIS — E785 Hyperlipidemia, unspecified: Secondary | ICD-10-CM | POA: Insufficient documentation

## 2013-02-17 ENCOUNTER — Other Ambulatory Visit: Payer: Self-pay | Admitting: Internal Medicine

## 2013-02-26 DIAGNOSIS — M810 Age-related osteoporosis without current pathological fracture: Secondary | ICD-10-CM | POA: Diagnosis not present

## 2013-04-04 HISTORY — PX: CATARACT EXTRACTION, BILATERAL: SHX1313

## 2013-04-08 ENCOUNTER — Other Ambulatory Visit: Payer: Self-pay | Admitting: Internal Medicine

## 2013-04-17 DIAGNOSIS — M810 Age-related osteoporosis without current pathological fracture: Secondary | ICD-10-CM | POA: Diagnosis not present

## 2013-04-17 DIAGNOSIS — N909 Noninflammatory disorder of vulva and perineum, unspecified: Secondary | ICD-10-CM | POA: Diagnosis not present

## 2013-04-17 DIAGNOSIS — L940 Localized scleroderma [morphea]: Secondary | ICD-10-CM | POA: Diagnosis not present

## 2013-04-18 ENCOUNTER — Other Ambulatory Visit: Payer: Self-pay | Admitting: Obstetrics

## 2013-04-26 ENCOUNTER — Encounter: Payer: Self-pay | Admitting: Internal Medicine

## 2013-04-26 ENCOUNTER — Ambulatory Visit (INDEPENDENT_AMBULATORY_CARE_PROVIDER_SITE_OTHER): Payer: Medicare Other | Admitting: Internal Medicine

## 2013-04-26 VITALS — BP 140/80 | HR 64 | Temp 98.2°F | Resp 16 | Ht 62.25 in | Wt 128.0 lb

## 2013-04-26 DIAGNOSIS — E039 Hypothyroidism, unspecified: Secondary | ICD-10-CM | POA: Diagnosis not present

## 2013-04-26 DIAGNOSIS — D509 Iron deficiency anemia, unspecified: Secondary | ICD-10-CM | POA: Diagnosis not present

## 2013-04-26 DIAGNOSIS — K219 Gastro-esophageal reflux disease without esophagitis: Secondary | ICD-10-CM | POA: Diagnosis not present

## 2013-04-26 DIAGNOSIS — I10 Essential (primary) hypertension: Secondary | ICD-10-CM

## 2013-04-26 LAB — CBC WITH DIFFERENTIAL/PLATELET
Basophils Absolute: 0.1 10*3/uL (ref 0.0–0.1)
Basophils Relative: 0.6 % (ref 0.0–3.0)
Eosinophils Absolute: 0.2 10*3/uL (ref 0.0–0.7)
Eosinophils Relative: 2.4 % (ref 0.0–5.0)
HCT: 32.6 % — ABNORMAL LOW (ref 36.0–46.0)
Hemoglobin: 11 g/dL — ABNORMAL LOW (ref 12.0–15.0)
Lymphocytes Relative: 30 % (ref 12.0–46.0)
Lymphs Abs: 2.9 10*3/uL (ref 0.7–4.0)
MCHC: 33.7 g/dL (ref 30.0–36.0)
MCV: 95.2 fl (ref 78.0–100.0)
Monocytes Absolute: 0.9 10*3/uL (ref 0.1–1.0)
Monocytes Relative: 9.2 % (ref 3.0–12.0)
Neutro Abs: 5.7 10*3/uL (ref 1.4–7.7)
Neutrophils Relative %: 57.8 % (ref 43.0–77.0)
Platelets: 229 10*3/uL (ref 150.0–400.0)
RBC: 3.43 Mil/uL — ABNORMAL LOW (ref 3.87–5.11)
RDW: 13 % (ref 11.5–14.6)
WBC: 9.8 10*3/uL (ref 4.5–10.5)

## 2013-04-26 NOTE — Progress Notes (Signed)
Subjective:    Patient ID: Rebecca [name], female    DOB: 1933-04-01, 80 y.o.   MRN: [id number]  HPI re clast scheduled in 2 weeks  Had rebal and calcium at GYN Slight elevation of WBCs needs monitoring for the elevation Donnatal stopped due to lactose On the reglan BID   No neuro-side effects specifically no tremor reported GERD stable on prilosec 20  BID and two zantac 150 Hoarse voice improved Increased flatus  Review of Systems  Constitutional: Positive for fatigue.  HENT: Positive for postnasal drip.   Eyes: Negative.   Respiratory: Negative.   Gastrointestinal:       Flatus  Endocrine: Negative.   Genitourinary: Positive for frequency.  Neurological: Negative.   Hematological: Negative.    Past Medical History  Diagnosis Date  . Hypertension   . Diverticulosis   . Esophageal stricture   . Anxiety   . Hemorrhoids   . Gastroparesis   . Anemia   . Hyperlipidemia   . Rectal ulcer   . Diverticulitis   . Tubular adenoma of colon 05/2009  . GERD (gastroesophageal reflux disease)   . Hypertension   . IBS (irritable bowel syndrome)     History   Social History  . Marital Status: Widowed    Spouse Name: N/A    Number of Children: N/A  . Years of Education: N/A   Occupational History  . Not on file.   Social History Main Topics  . Smoking status: Never Smoker   . Smokeless tobacco: Never Used  . Alcohol Use: Yes     Comment: rare  . Drug Use: No  . Sexual Activity: Yes   Other Topics Concern  . Not on file   Social History Narrative  . No narrative on file    Past Surgical History  Procedure Laterality Date  . Tonsillectomy    . Appendectomy    . Arcuate keratectomy    . Knee surgery Bilateral   . Hernia repair      umbilical  . Double pallital tori bone removed     . Morton's neuroma on rt foot and platar facial release    . Mouth surgery      "bone shaved off roof of mouth"  . Salivary gland surgery      removal    Family History    Problem Relation Age of Onset  . Crohn's disease Neg Hx   . Migraines Daughter   . Colon polyps Sister   . Lung cancer Sister   . Colon polyps Brother   . Diabetes Brother   . Hyperlipidemia Brother   . Hypertension Brother   . Colon polyps Daughter   . Breast cancer Daughter   . Colon cancer Maternal Aunt   . Diabetes Mother   . Heart disease Mother   . Hyperlipidemia Mother   . Hypertension Mother   . Cancer Daughter     salivary  . Anuerysm Father   . Heart disease Father   . Hypertension Father   . Colon cancer Paternal Grandmother   . Heart disease Brother     Allergies  Allergen Reactions  . Ciprofloxacin     REACTION: Thrush  . Codeine   . Lactose Intolerance (Gi)   . Pneumococcal Vaccine Polyvalent     REACTION: RED AND RAISED RASH ON ARM  . Sulfamethoxazole-Trimethoprim   . Tramadol Hcl   . Adhesive [Tape] Rash    Current Outpatient Prescriptions on File Prior to  Visit  Medication Sig Dispense Refill  . ALPRAZolam (XANAX) 0.25 MG tablet take 1 tablet by mouth once daily if needed  30 tablet  3  . Calcium Carbonate-Vitamin D (CALCIUM-VITAMIN D) 500-200 MG-UNIT per tablet Take 1 tablet by mouth 2 (two) times daily with a meal.        . Cholecalciferol (VITAMIN D3) 2000 UNITS TABS Take 1 tablet by mouth every morning.       [name] Kitchen esomeprazole (NEXIUM) 40 MG capsule Take 1 capsule (40 mg total) by mouth 2 (two) times daily.  60 capsule  11  . ezetimibe-simvastatin (VYTORIN) 10-20 MG per tablet Take 1 tablet by mouth every morning.       [name] Kitchen FERREX 150 150 MG capsule take 1 capsule by mouth once daily  30 capsule  6  . ferrous sulfate 325 (65 FE) MG tablet Take 325 mg by mouth daily with breakfast.      . gabapentin (NEURONTIN) 100 MG capsule take 1 capsule by mouth at bedtime if needed  30 capsule  3  . losartan (COZAAR) 100 MG tablet Take 100 mg by mouth daily.      . ranitidine (ZANTAC) 300 MG tablet Take 1 tablet (300 mg total) by mouth at bedtime.  30 tablet  11   . SYNTHROID 88 MCG tablet take 1 tablet by mouth once daily  30 tablet  9  . vitamin B-12 (CYANOCOBALAMIN) 500 MCG tablet Take 500 mcg by mouth every morning.       . [DISCONTINUED] olmesartan (BENICAR) 20 MG tablet Take 20 mg by mouth daily.         No current facility-administered medications on file prior to visit.    BP 140/80  Pulse 64  Temp(Src) 98.2 F (36.8 C)  Resp 16  Ht 5' 2.25" (1.581 m)  Wt 128 lb (58.06 kg)  BMI 23.23 kg/m2       Objective:   Physical Exam  Nursing note and vitals reviewed. Constitutional: She is oriented to person, place, and time. She appears well-developed and well-nourished. No distress.  HENT:  Head: Normocephalic and atraumatic.  Eyes: Conjunctivae and EOM are normal. Pupils are equal, round, and reactive to light.  Neck: Normal range of motion. Neck supple. No JVD present. No tracheal deviation present. No thyromegaly present.  Cardiovascular: Normal rate and regular rhythm.   No murmur heard. Pulmonary/Chest: Effort normal and breath sounds normal. She has no wheezes. She exhibits no tenderness.  Abdominal: Soft. Bowel sounds are normal.  Musculoskeletal: Normal range of motion. She exhibits no edema and no tenderness.  Lymphadenopathy:    She has no cervical adenopathy.  Neurological: She is alert and oriented to person, place, and time. She has normal reflexes. No cranial nerve deficit.  Skin: Skin is warm and dry. She is not diaphoretic.  Psychiatric: She has a normal mood and affect. Her behavior is normal.          Assessment & Plan:  Increased flatus... Dr Fuller Plan gave her literature on food not to eat Change out probiotic Stable HTN Lipid stable on vytorin Increased gas not effected by holding medication  Thyroid stable

## 2013-04-26 NOTE — Patient Instructions (Signed)
Change probiotic

## 2013-04-26 NOTE — Addendum Note (Signed)
Addended by: Elmer Picker on: 04/26/2013 03:58 PM   Modules accepted: Orders

## 2013-04-26 NOTE — Addendum Note (Signed)
Addended by: Ricard Dillon on: 04/26/2013 03:52 PM   Modules accepted: Orders

## 2013-04-29 ENCOUNTER — Telehealth: Payer: Self-pay | Admitting: Internal Medicine

## 2013-04-29 NOTE — Telephone Encounter (Signed)
Relevant patient education assigned to patient using Emmi. ° °

## 2013-05-06 ENCOUNTER — Ambulatory Visit (HOSPITAL_COMMUNITY)
Admission: RE | Admit: 2013-05-06 | Discharge: 2013-05-06 | Disposition: A | Discharge status: Home / Self Care | Payer: Medicare Other | Source: Ambulatory Visit | Attending: Obstetrics | Admitting: Obstetrics

## 2013-05-06 ENCOUNTER — Other Ambulatory Visit (HOSPITAL_COMMUNITY): Payer: Self-pay | Admitting: Obstetrics

## 2013-05-06 DIAGNOSIS — M810 Age-related osteoporosis without current pathological fracture: Secondary | ICD-10-CM | POA: Insufficient documentation

## 2013-05-06 MED ORDER — SODIUM CHLORIDE 0.9 % IV SOLN
250.0000 mL | Freq: Once | INTRAVENOUS | Status: AC
  Administered 2013-05-06: 250 mL via INTRAVENOUS

## 2013-05-06 MED ORDER — ZOLEDRONIC ACID 5 MG/100ML IV SOLN
5.0000 mg | Freq: Once | INTRAVENOUS | Status: AC
  Administered 2013-05-06: 5 mg via INTRAVENOUS
  Filled 2013-05-06: qty 100

## 2013-05-06 NOTE — Discharge Instructions (Signed)
Zoledronic Acid injection (Paget's Disease, Osteoporosis) °What is this medicine? °ZOLEDRONIC ACID (ZOE le dron ik AS id) lowers the amount of calcium loss from bone. It is used to treat Paget's disease and osteoporosis in women. °This medicine may be used for other purposes; ask your health care provider or pharmacist if you have questions. °COMMON BRAND NAME(S): Reclast, Zometa °What should I tell my health care provider before I take this medicine? °They need to know if you have any of these conditions: °-aspirin-sensitive asthma °-cancer, especially if you are receiving medicines used to treat cancer °-dental disease or wear dentures °-infection °-kidney disease °-low levels of calcium in the blood °-past surgery on the parathyroid gland or intestines °-receiving corticosteroids like dexamethasone or prednisone °-an unusual or allergic reaction to zoledronic acid, other medicines, foods, dyes, or preservatives °-pregnant or trying to get pregnant °-breast-feeding °How should I use this medicine? °This medicine is for infusion into a vein. It is given by a health care professional in a hospital or clinic setting. °Talk to your pediatrician regarding the use of this medicine in children. This medicine is not approved for use in children. °Overdosage: If you think you have taken too much of this medicine contact a poison control center or emergency room at once. °NOTE: This medicine is only for you. Do not share this medicine with others. °What if I miss a dose? °It is important not to miss your dose. Call your doctor or health care professional if you are unable to keep an appointment. °What may interact with this medicine? °-certain antibiotics given by injection °-NSAIDs, medicines for pain and inflammation, like ibuprofen or naproxen °-some diuretics like bumetanide, furosemide °-teriparatide °This list may not describe all possible interactions. Give your health care provider a list of all the medicines,  herbs, non-prescription drugs, or dietary supplements you use. Also tell them if you smoke, drink alcohol, or use illegal drugs. Some items may interact with your medicine. °What should I watch for while using this medicine? °Visit your doctor or health care professional for regular checkups. It may be some time before you see the benefit from this medicine. Do not stop taking your medicine unless your doctor tells you to. Your doctor may order blood tests or other tests to see how you are doing. °Women should inform their doctor if they wish to become pregnant or think they might be pregnant. There is a potential for serious side effects to an unborn child. Talk to your health care professional or pharmacist for more information. °You should make sure that you get enough calcium and vitamin D while you are taking this medicine. Discuss the foods you eat and the vitamins you take with your health care professional. °Some people who take this medicine have severe bone, joint, and/or muscle pain. This medicine may also increase your risk for jaw problems or a broken thigh bone. Tell your doctor right away if you have severe pain in your jaw, bones, joints, or muscles. Tell your doctor if you have any pain that does not go away or that gets worse. °Tell your dentist and dental surgeon that you are taking this medicine. You should not have major dental surgery while on this medicine. See your dentist to have a dental exam and fix any dental problems before starting this medicine. Take good care of your teeth while on this medicine. Make sure you see your dentist for regular follow-up appointments. °What side effects may I notice from receiving this medicine? °  Side effects that you should report to your doctor or health care professional as soon as possible: -allergic reactions like skin rash, itching or hives, swelling of the face, lips, or tongue -anxiety, confusion, or depression -breathing problems -changes in  vision -eye pain -feeling faint or lightheaded, falls -jaw pain, especially after dental work -mouth sores -muscle cramps, stiffness, or weakness -trouble passing urine or change in the amount of urine Side effects that usually do not require medical attention (report to your doctor or health care professional if they continue or are bothersome): -bone, joint, or muscle pain -constipation -diarrhea -fever -hair loss -irritation at site where injected -loss of appetite -nausea, vomiting -stomach upset -trouble sleeping -trouble swallowing -weak or tired This list may not describe all possible side effects. Call your doctor for medical advice about side effects. You may report side effects to FDA at 1-800-FDA-1088. Where should I keep my medicine? This drug is given in a hospital or clinic and will not be stored at home. NOTE: This sheet is a summary. It may not cover all possible information. If you have questions about this medicine, talk to your doctor, pharmacist, or health care provider.  2014, [employer]vier/Gold Standard. (2012-09-03 10:03:48) Osteoporosis Throughout your life, your body breaks down old bone and replaces it with new bone. As you get older, your body does not replace bone as quickly as it breaks it down. By the age of 32 years, most people begin to gradually lose bone because of the imbalance between bone loss and replacement. Some people lose more bone than others. Bone loss beyond a specified normal degree is considered osteoporosis.  Osteoporosis affects the strength and durability of your bones. The inside of the ends of your bones and your flat bones, like the bones of your pelvis, look like honeycomb, filled with tiny open spaces. As bone loss occurs, your bones become less dense. This means that the open spaces inside your bones become bigger and the walls between these spaces become thinner. This makes your bones weaker. Bones of a person with osteoporosis can  become so weak that they can break (fracture) during minor accidents, such as a simple fall. CAUSES  The following factors have been associated with the development of osteoporosis:  Smoking.  Drinking more than 2 alcoholic drinks several days per week.  Long-term use of certain medicines:  Corticosteroids.  Chemotherapy medicines.  Thyroid medicines.  Antiepileptic medicines.  Gonadal hormone suppression medicine.  Immunosuppression medicine.  Being underweight.  Lack of physical activity.  Lack of exposure to the sun. This can lead to vitamin D deficiency.  Certain medical conditions:  Certain inflammatory bowel diseases, such as Crohn disease and ulcerative colitis.  Diabetes.  Hyperthyroidism.  Hyperparathyroidism. RISK FACTORS Anyone can develop osteoporosis. However, the following factors can increase your risk of developing osteoporosis:  Gender Women are at higher risk than men.  Age Being older than 13 years increases your risk.  Ethnicity White and Asian people have an increased risk.  Weight Being extremely underweight can increase your risk of osteoporosis.  Family history of osteoporosis Having a family member who has developed osteoporosis can increase your risk. SYMPTOMS  Usually, people with osteoporosis have no symptoms.  DIAGNOSIS  Signs during a physical exam that may prompt your caregiver to suspect osteoporosis include:  Decreased height. This is usually caused by the compression of the bones that form your spine (vertebrae) because they have weakened and become fractured.  A curving or rounding of the  upper back (kyphosis). To confirm signs of osteoporosis, your caregiver may request a procedure that uses 2 low-dose X-ray beams with different levels of energy to measure your bone mineral density (dual-energy X-ray absorptiometry [DXA]). Also, your caregiver may check your level of vitamin D. TREATMENT  The goal of osteoporosis treatment  is to strengthen bones in order to decrease the risk of bone fractures. There are different types of medicines available to help achieve this goal. Some of these medicines work by slowing the processes of bone loss. Some medicines work by increasing bone density. Treatment also involves making sure that your levels of calcium and vitamin D are adequate. PREVENTION  There are things you can do to help prevent osteoporosis. Adequate intake of calcium and vitamin D can help you achieve optimal bone mineral density. Regular exercise can also help, especially resistance and weight-bearing activities. If you smoke, quitting smoking is an important part of osteoporosis prevention. MAKE SURE YOU:  Understand these instructions.  Will watch your condition.  Will get help right away if you are not doing well or get worse. FOR MORE INFORMATION www.osteo.org and [employer].com.ee Document Released: 12/29/2004 Document Revised: 07/16/2012 Document Reviewed: 03/05/2011 [facility] Patient Information 2014 [place], [place].

## 2013-05-31 ENCOUNTER — Other Ambulatory Visit: Payer: Self-pay | Admitting: Internal Medicine

## 2013-06-18 ENCOUNTER — Other Ambulatory Visit: Payer: Self-pay | Admitting: Internal Medicine

## 2013-06-26 DIAGNOSIS — H903 Sensorineural hearing loss, bilateral: Secondary | ICD-10-CM | POA: Diagnosis not present

## 2013-07-23 DIAGNOSIS — K130 Diseases of lips: Secondary | ICD-10-CM | POA: Diagnosis not present

## 2013-07-23 DIAGNOSIS — L723 Sebaceous cyst: Secondary | ICD-10-CM | POA: Diagnosis not present

## 2013-07-29 ENCOUNTER — Other Ambulatory Visit: Payer: Self-pay | Admitting: Dermatology

## 2013-07-29 DIAGNOSIS — L723 Sebaceous cyst: Secondary | ICD-10-CM | POA: Diagnosis not present

## 2013-08-02 ENCOUNTER — Other Ambulatory Visit: Payer: Self-pay | Admitting: Internal Medicine

## 2013-08-12 DIAGNOSIS — Z1231 Encounter for screening mammogram for malignant neoplasm of breast: Secondary | ICD-10-CM | POA: Diagnosis not present

## 2013-08-27 DIAGNOSIS — M11869 Other specified crystal arthropathies, unspecified knee: Secondary | ICD-10-CM | POA: Diagnosis not present

## 2013-08-27 DIAGNOSIS — M1710 Unilateral primary osteoarthritis, unspecified knee: Secondary | ICD-10-CM | POA: Diagnosis not present

## 2013-08-29 ENCOUNTER — Encounter: Payer: Self-pay | Admitting: Internal Medicine

## 2013-08-30 ENCOUNTER — Encounter: Payer: Self-pay | Admitting: Internal Medicine

## 2013-08-30 ENCOUNTER — Other Ambulatory Visit: Payer: Self-pay | Admitting: Internal Medicine

## 2013-09-05 ENCOUNTER — Encounter: Payer: Self-pay | Admitting: Internal Medicine

## 2013-09-05 MED ORDER — EZETIMIBE-SIMVASTATIN 10-20 MG PO TABS: 1.0000 | ORAL_TABLET | Freq: Every morning | ORAL | Status: DC

## 2013-09-05 NOTE — Telephone Encounter (Signed)
Rx sent to pharmacy   

## 2013-09-12 ENCOUNTER — Telehealth: Payer: Self-pay | Admitting: *Deleted

## 2013-09-12 NOTE — Telephone Encounter (Signed)
Dr Elease Hashimoto, I asked you last week if you would accept this patient and you verbally agreed.  She states that you see her son and his wife.  Could you just confirm so that it is documented?  Thank you

## 2013-09-13 NOTE — Telephone Encounter (Signed)
I dont recall but if I said yes OK.

## 2013-09-13 NOTE — Telephone Encounter (Signed)
Please call and schedule appt to establish with Dr Elease Hashimoto.  Thanks

## 2013-09-13 NOTE — Telephone Encounter (Signed)
Pt has been sch

## 2013-09-26 ENCOUNTER — Encounter: Payer: Self-pay | Admitting: Family Medicine

## 2013-09-26 ENCOUNTER — Ambulatory Visit (INDEPENDENT_AMBULATORY_CARE_PROVIDER_SITE_OTHER): Payer: Medicare Other | Admitting: Family Medicine

## 2013-09-26 VITALS — BP 140/70 | HR 65 | Temp 98.2°F | Wt 126.0 lb

## 2013-09-26 DIAGNOSIS — J018 Other acute sinusitis: Secondary | ICD-10-CM | POA: Diagnosis not present

## 2013-09-26 MED ORDER — AZITHROMYCIN 250 MG PO TABS: ORAL_TABLET | ORAL | Status: AC

## 2013-09-26 NOTE — Progress Notes (Signed)
Pre visit review using our clinic review tool, if applicable. No additional management support is needed unless otherwise documented below in the visit note. 

## 2013-09-26 NOTE — Progress Notes (Signed)
   Subjective:    Patient ID: Rebecca [name], female    DOB: [birth year], 80 y.o.   MRN: [id number]  Sinus Problem Associated symptoms include congestion, coughing and headaches. Pertinent negatives include no chills.   Acute visit Patient seen with one week history of progressive frontal sinus pressure with greenish nasal discharge. Increased malaise. She's had intermittent headaches. No fevers or chills. Occasional dry cough. She's had sinusitis with similar symptoms the past though not about 2 years. Nonsmoker. Multiple drug allergies but has taken Zithromax in the past  Past Medical History  Diagnosis Date  . Hypertension   . Diverticulosis   . Esophageal stricture   . Anxiety   . Hemorrhoids   . Gastroparesis   . Anemia   . Hyperlipidemia   . Rectal ulcer   . Diverticulitis   . Tubular adenoma of colon 05/2009  . GERD (gastroesophageal reflux disease)   . Hypertension   . IBS (irritable bowel syndrome)    Past Surgical History  Procedure Laterality Date  . Tonsillectomy    . Appendectomy    . Arcuate keratectomy    . Knee surgery Bilateral   . Hernia repair      umbilical  . Double pallital tori bone removed     . Morton's neuroma on rt foot and platar facial release    . Mouth surgery      "bone shaved off roof of mouth"  . Salivary gland surgery      removal    reports that she has never smoked. She has never used smokeless tobacco. She reports that she drinks alcohol. She reports that she does not use illicit drugs. family history includes Anuerysm in her father; Breast cancer in her daughter; Cancer in her daughter; Colon cancer in her maternal aunt and paternal grandmother; Colon polyps in her brother, daughter, and sister; Diabetes in her brother and mother; Heart disease in her brother, father, and mother; Hyperlipidemia in her brother and mother; Hypertension in her brother, father, and mother; Lung cancer in her sister; Migraines in her daughter. There is no  history of Crohn's disease. Allergies  Allergen Reactions  . Ciprofloxacin     REACTION: Thrush  . Codeine   . Lactose Intolerance (Gi)   . Pneumococcal Vaccine Polyvalent     REACTION: RED AND RAISED RASH ON ARM  . Sulfamethoxazole-Trimethoprim   . Tramadol Hcl   . Adhesive [Tape] Rash      Review of Systems  Constitutional: Positive for fatigue. Negative for fever and chills.  HENT: Positive for congestion.   Respiratory: Positive for cough.   Gastrointestinal: Negative for nausea and vomiting.  Neurological: Positive for headaches.       Objective:   Physical Exam  Constitutional: She appears well-developed and well-nourished.  HENT:  Right Ear: External ear normal.  Left Ear: External ear normal.  Mouth/Throat: Oropharynx is clear and moist.  Neck: Neck supple.  Cardiovascular: Normal rate and regular rhythm.   Pulmonary/Chest: Effort normal and breath sounds normal. No respiratory distress. She has no wheezes. She has no rales.  Lymphadenopathy:    She has no cervical adenopathy.          Assessment & Plan:  Acute sinusitis. Zithromax for 5 days. Continue Mucinex. Followup as needed

## 2013-09-26 NOTE — Patient Instructions (Signed)
Sinusitis Sinusitis is redness, soreness, and swelling (inflammation) of the paranasal sinuses. Paranasal sinuses are air pockets within the bones of your face (beneath the eyes, the middle of the forehead, or above the eyes). In healthy paranasal sinuses, mucus is able to drain out, and air is able to circulate through them by way of your nose. However, when your paranasal sinuses are inflamed, mucus and air can become trapped. This can allow bacteria and other germs to grow and cause infection. Sinusitis can develop quickly and last only a short time (acute) or continue over a long period (chronic). Sinusitis that lasts for more than 12 weeks is considered chronic.  CAUSES  Causes of sinusitis include:  Allergies.  Structural abnormalities, such as displacement of the cartilage that separates your nostrils (deviated septum), which can decrease the air flow through your nose and sinuses and affect sinus drainage.  Functional abnormalities, such as when the small hairs (cilia) that line your sinuses and help remove mucus do not work properly or are not present. SYMPTOMS  Symptoms of acute and chronic sinusitis are the same. The primary symptoms are pain and pressure around the affected sinuses. Other symptoms include:  Upper toothache.  Earache.  Headache.  Bad breath.  Decreased sense of smell and taste.  A cough, which worsens when you are lying flat.  Fatigue.  Fever.  Thick drainage from your nose, which often is green and may contain pus (purulent).  Swelling and warmth over the affected sinuses. DIAGNOSIS  Your caregiver will perform a physical exam. During the exam, your caregiver may:  Look in your nose for signs of abnormal growths in your nostrils (nasal polyps).  Tap over the affected sinus to check for signs of infection.  View the inside of your sinuses (endoscopy) with a special imaging device with a light attached (endoscope), which is inserted into your  sinuses. If your caregiver suspects that you have chronic sinusitis, one or more of the following tests may be recommended:  Allergy tests.  Nasal culture--A sample of mucus is taken from your nose and sent to a lab and screened for bacteria.  Nasal cytology--A sample of mucus is taken from your nose and examined by your caregiver to determine if your sinusitis is related to an allergy. TREATMENT  Most cases of acute sinusitis are related to a viral infection and will resolve on their own within 10 days. Sometimes medicines are prescribed to help relieve symptoms (pain medicine, decongestants, nasal steroid sprays, or saline sprays).  However, for sinusitis related to a bacterial infection, your caregiver will prescribe antibiotic medicines. These are medicines that will help kill the bacteria causing the infection.  Rarely, sinusitis is caused by a fungal infection. In theses cases, your caregiver will prescribe antifungal medicine. For some cases of chronic sinusitis, surgery is needed. Generally, these are cases in which sinusitis recurs more than 3 times per year, despite other treatments. HOME CARE INSTRUCTIONS   Drink plenty of water. Water helps thin the mucus so your sinuses can drain more easily.  Use a humidifier.  Inhale steam 3 to 4 times a day (for example, sit in the bathroom with the shower running).  Apply a warm, moist washcloth to your face 3 to 4 times a day, or as directed by your caregiver.  Use saline nasal sprays to help moisten and clean your sinuses.  Take over-the-counter or prescription medicines for pain, discomfort, or fever only as directed by your caregiver. SEEK IMMEDIATE MEDICAL CARE IF:    You have increasing pain or severe headaches.  You have nausea, vomiting, or drowsiness.  You have swelling around your face.  You have vision problems.  You have a stiff neck.  You have difficulty breathing. MAKE SURE YOU:   Understand these  instructions.  Will watch your condition.  Will get help right away if you are not doing well or get worse. Document Released: 03/21/2005 Document Revised: 06/13/2011 Document Reviewed: 04/05/2011 [employer] Patient Information 2015 [employer]. This information is not intended to replace advice given to you by your health care provider. Make sure you discuss any questions you have with your health care provider.  

## 2013-10-01 DIAGNOSIS — L259 Unspecified contact dermatitis, unspecified cause: Secondary | ICD-10-CM | POA: Diagnosis not present

## 2013-10-01 DIAGNOSIS — D1801 Hemangioma of skin and subcutaneous tissue: Secondary | ICD-10-CM | POA: Diagnosis not present

## 2013-10-01 DIAGNOSIS — L819 Disorder of pigmentation, unspecified: Secondary | ICD-10-CM | POA: Diagnosis not present

## 2013-10-01 DIAGNOSIS — L821 Other seborrheic keratosis: Secondary | ICD-10-CM | POA: Diagnosis not present

## 2013-10-01 DIAGNOSIS — D239 Other benign neoplasm of skin, unspecified: Secondary | ICD-10-CM | POA: Diagnosis not present

## 2013-10-07 DIAGNOSIS — M461 Sacroiliitis, not elsewhere classified: Secondary | ICD-10-CM | POA: Diagnosis not present

## 2013-10-15 ENCOUNTER — Other Ambulatory Visit: Payer: Self-pay | Admitting: Orthopaedic Surgery

## 2013-10-15 DIAGNOSIS — M533 Sacrococcygeal disorders, not elsewhere classified: Secondary | ICD-10-CM

## 2013-10-20 ENCOUNTER — Ambulatory Visit
Admission: RE | Admit: 2013-10-20 | Discharge: 2013-10-20 | Disposition: A | Discharge status: Home / Self Care | Payer: Medicare Other | Source: Ambulatory Visit | Attending: Orthopaedic Surgery | Admitting: Orthopaedic Surgery

## 2013-10-20 DIAGNOSIS — M25559 Pain in unspecified hip: Secondary | ICD-10-CM | POA: Diagnosis not present

## 2013-10-20 DIAGNOSIS — M533 Sacrococcygeal disorders, not elsewhere classified: Secondary | ICD-10-CM

## 2013-10-23 DIAGNOSIS — M4120 Other idiopathic scoliosis, site unspecified: Secondary | ICD-10-CM | POA: Diagnosis not present

## 2013-10-23 DIAGNOSIS — M5430 Sciatica, unspecified side: Secondary | ICD-10-CM | POA: Diagnosis not present

## 2013-10-25 ENCOUNTER — Other Ambulatory Visit: Payer: Self-pay | Admitting: Internal Medicine

## 2013-11-15 ENCOUNTER — Other Ambulatory Visit: Payer: Self-pay | Admitting: Internal Medicine

## 2013-12-02 ENCOUNTER — Other Ambulatory Visit: Payer: Self-pay | Admitting: Internal Medicine

## 2013-12-05 DIAGNOSIS — M4120 Other idiopathic scoliosis, site unspecified: Secondary | ICD-10-CM | POA: Diagnosis not present

## 2013-12-11 DIAGNOSIS — H903 Sensorineural hearing loss, bilateral: Secondary | ICD-10-CM | POA: Diagnosis not present

## 2013-12-13 DIAGNOSIS — Z01419 Encounter for gynecological examination (general) (routine) without abnormal findings: Secondary | ICD-10-CM | POA: Diagnosis not present

## 2013-12-13 DIAGNOSIS — L940 Localized scleroderma [morphea]: Secondary | ICD-10-CM | POA: Diagnosis not present

## 2013-12-13 DIAGNOSIS — Z124 Encounter for screening for malignant neoplasm of cervix: Secondary | ICD-10-CM | POA: Diagnosis not present

## 2013-12-13 DIAGNOSIS — M810 Age-related osteoporosis without current pathological fracture: Secondary | ICD-10-CM | POA: Diagnosis not present

## 2013-12-24 DIAGNOSIS — M47817 Spondylosis without myelopathy or radiculopathy, lumbosacral region: Secondary | ICD-10-CM | POA: Diagnosis not present

## 2013-12-24 DIAGNOSIS — M48061 Spinal stenosis, lumbar region without neurogenic claudication: Secondary | ICD-10-CM | POA: Diagnosis not present

## 2013-12-25 ENCOUNTER — Ambulatory Visit (INDEPENDENT_AMBULATORY_CARE_PROVIDER_SITE_OTHER): Payer: Medicare Other | Admitting: Family Medicine

## 2013-12-25 ENCOUNTER — Encounter: Payer: Self-pay | Admitting: Family Medicine

## 2013-12-25 ENCOUNTER — Other Ambulatory Visit: Payer: Self-pay | Admitting: Family Medicine

## 2013-12-25 ENCOUNTER — Encounter: Payer: Medicare Other | Admitting: Internal Medicine

## 2013-12-25 VITALS — BP 140/80 | HR 59 | Temp 97.6°F | Ht 62.25 in | Wt 124.0 lb

## 2013-12-25 DIAGNOSIS — I10 Essential (primary) hypertension: Secondary | ICD-10-CM | POA: Diagnosis not present

## 2013-12-25 DIAGNOSIS — D509 Iron deficiency anemia, unspecified: Secondary | ICD-10-CM | POA: Diagnosis not present

## 2013-12-25 DIAGNOSIS — E039 Hypothyroidism, unspecified: Secondary | ICD-10-CM | POA: Diagnosis not present

## 2013-12-25 DIAGNOSIS — D72829 Elevated white blood cell count, unspecified: Secondary | ICD-10-CM

## 2013-12-25 DIAGNOSIS — Z0000 Encounter for general adult medical examination without abnormal findings: Secondary | ICD-10-CM | POA: Diagnosis not present

## 2013-12-25 DIAGNOSIS — K3184 Gastroparesis: Secondary | ICD-10-CM

## 2013-12-25 DIAGNOSIS — E559 Vitamin D deficiency, unspecified: Secondary | ICD-10-CM | POA: Insufficient documentation

## 2013-12-25 DIAGNOSIS — E785 Hyperlipidemia, unspecified: Secondary | ICD-10-CM | POA: Diagnosis not present

## 2013-12-25 DIAGNOSIS — Z23 Encounter for immunization: Secondary | ICD-10-CM

## 2013-12-25 LAB — HEPATIC FUNCTION PANEL
ALT: 16 U/L (ref 0–35)
AST: 20 U/L (ref 0–37)
Albumin: 4.1 g/dL (ref 3.5–5.2)
Alkaline Phosphatase: 67 U/L (ref 39–117)
Bilirubin, Direct: 0.1 mg/dL (ref 0.0–0.3)
Total Bilirubin: 0.6 mg/dL (ref 0.2–1.2)
Total Protein: 7 g/dL (ref 6.0–8.3)

## 2013-12-25 LAB — BASIC METABOLIC PANEL
BUN: 13 mg/dL (ref 6–23)
CO2: 25 mEq/L (ref 19–32)
Calcium: 9.6 mg/dL (ref 8.4–10.5)
Chloride: 101 mEq/L (ref 96–112)
Creatinine, Ser: 0.9 mg/dL (ref 0.4–1.2)
GFR: 68.6 mL/min (ref >60.00)
Glucose, Bld: 109 mg/dL — ABNORMAL HIGH (ref 70–99)
Potassium: 4 mEq/L (ref 3.5–5.1)
Sodium: 134 mEq/L — ABNORMAL LOW (ref 135–145)

## 2013-12-25 LAB — CBC WITH DIFFERENTIAL/PLATELET
Basophils Absolute: 0 10*3/uL (ref 0.0–0.1)
Basophils Relative: 0.2 % (ref 0.0–3.0)
Eosinophils Absolute: 0 10*3/uL (ref 0.0–0.7)
Eosinophils Relative: 0 % (ref 0.0–5.0)
HCT: 36.8 % (ref 36.0–46.0)
Hemoglobin: 12.1 g/dL (ref 12.0–15.0)
Lymphocytes Relative: 24.7 % (ref 12.0–46.0)
Lymphs Abs: 3.2 10*3/uL (ref 0.7–4.0)
MCHC: 33 g/dL (ref 30.0–36.0)
MCV: 99.9 fl (ref 78.0–100.0)
Monocytes Absolute: 0.6 10*3/uL (ref 0.1–1.0)
Monocytes Relative: 4.9 % (ref 3.0–12.0)
Neutro Abs: 9 10*3/uL — ABNORMAL HIGH (ref 1.4–7.7)
Neutrophils Relative %: 70.2 % (ref 43.0–77.0)
Platelets: 289 10*3/uL (ref 150.0–400.0)
RBC: 3.68 Mil/uL — ABNORMAL LOW (ref 3.87–5.11)
RDW: 12.8 % (ref 11.5–15.5)
WBC: 12.8 10*3/uL — ABNORMAL HIGH (ref 4.0–10.5)

## 2013-12-25 LAB — VITAMIN B12: Vitamin B-12: >1500 pg/mL — ABNORMAL HIGH (ref 211–911)

## 2013-12-25 LAB — LIPID PANEL
Cholesterol: 216 mg/dL — ABNORMAL HIGH (ref 0–200)
HDL: 46.5 mg/dL (ref >39.00)
LDL Cholesterol: 130 mg/dL — ABNORMAL HIGH (ref 0–99)
NonHDL: 169.5
Total CHOL/HDL Ratio: 5
Triglycerides: 197 mg/dL — ABNORMAL HIGH (ref 0.0–149.0)
VLDL: 39.4 mg/dL (ref 0.0–40.0)

## 2013-12-25 LAB — TSH: TSH: 1.2 u[IU]/mL (ref 0.35–4.50)

## 2013-12-25 LAB — VITAMIN D 25 HYDROXY (VIT D DEFICIENCY, FRACTURES): VITD: 41.39 ng/mL (ref 30.00–100.00)

## 2013-12-25 MED ORDER — ALPRAZOLAM 0.25 MG PO TABS: 0.2500 mg | ORAL_TABLET | Freq: Every evening | ORAL | Status: DC | PRN

## 2013-12-25 NOTE — Progress Notes (Signed)
Pre visit review using our clinic review tool, if applicable. No additional management support is needed unless otherwise documented below in the visit note. 

## 2013-12-25 NOTE — Progress Notes (Signed)
Subjective:    Patient ID: Rebecca [name], female    DOB: 1933/01/05, 80 y.o.   MRN: [id number]  HPI  Patient seen for well visit and medical followup. Her chronic problems include history of GERD, hyperlipidemia, hypertension, hypothyroidism, anemia, scoliosis, lumbar spondylosis, and peripheral vascular disease. She also has history of osteoporosis. She takes regular calcium and vitamin D. She's had previous allergic reaction to pneumococcal vaccine. She needs flu vaccine. Medications reviewed and compliant with all. Denies any side effects. She sees gynecologist regularly. Mammograms up-to-date. Colonoscopy up to date.  Past Medical History  Diagnosis Date  . Hypertension   . Diverticulosis   . Esophageal stricture   . Anxiety   . Hemorrhoids   . Gastroparesis   . Anemia   . Hyperlipidemia   . Rectal ulcer   . Diverticulitis   . Tubular adenoma of colon 05/2009  . GERD (gastroesophageal reflux disease)   . Hypertension   . IBS (irritable bowel syndrome)    Past Surgical History  Procedure Laterality Date  . Tonsillectomy    . Appendectomy    . Arcuate keratectomy    . Knee surgery Bilateral   . Hernia repair      umbilical  . Double pallital tori bone removed     . Morton's neuroma on rt foot and platar facial release    . Mouth surgery      "bone shaved off roof of mouth"  . Salivary gland surgery      removal    reports that she has never smoked. She has never used smokeless tobacco. She reports that she drinks alcohol. She reports that she does not use illicit drugs. family history includes Anuerysm in her father; Breast cancer in her daughter; Cancer in her daughter; Colon cancer in her maternal aunt and paternal grandmother; Colon polyps in her brother, daughter, and sister; Diabetes in her brother and mother; Heart disease in her brother, father, and mother; Hyperlipidemia in her brother and mother; Hypertension in her brother, father, and mother; Lung cancer in  her sister; Migraines in her daughter. There is no history of Crohn's disease. Allergies  Allergen Reactions  . Ciprofloxacin     REACTION: Thrush  . Codeine   . Lactose Intolerance (Gi)   . Pneumococcal Vaccine Polyvalent     REACTION: RED AND RAISED RASH ON ARM  . Sulfamethoxazole-Trimethoprim   . Tramadol Hcl   . Adhesive [Tape] Rash   1.  Risk factors based on Past Medical , Social, and Family history reviewed and as indicated above with no changes 2.  Limitations in physical activities None.  No recent falls. 3.  Depression/mood No active depression or anxiety issues 4.  Hearing  chronic defiits 5.  ADLs independent in all. 6.  Cognitive function (orientation to time and place, language, writing, speech,memory) no short or long term memory issues.  Language and judgement intact. 7.  Home Safety no issues 8.  Height, weight, and visual acuity.all stable. 9.  Counseling discussed weight bearing exercise and ongoing supplementation with Vit D and calcium. 10. Recommendation of preventive services. Flu vaccine given.  Pneumovax contraindicated. 11. Labs based on risk factors-TSH, CBC, BMP, LIPID. Vit D , and B12. 12. Care Plan as above. 13. Other Providers Dr Fuller Plan -GI. 14. Written schedule of screening/prevention services given to patient.    Review of Systems  Constitutional: Negative for fever, activity change, appetite change, fatigue and unexpected weight change.  HENT: Negative for ear pain, hearing  loss, sore throat and trouble swallowing.   Eyes: Negative for visual disturbance.  Respiratory: Negative for cough and shortness of breath.   Cardiovascular: Negative for chest pain and palpitations.  Gastrointestinal: Negative for abdominal pain, diarrhea, constipation and blood in stool.  Genitourinary: Negative for dysuria and hematuria.  Musculoskeletal: Positive for arthralgias and back pain. Negative for myalgias.  Skin: Negative for rash.  Neurological: Negative for  dizziness, syncope and headaches.  Hematological: Negative for adenopathy.  Psychiatric/Behavioral: Negative for confusion and dysphoric mood.       Objective:   Physical Exam  Constitutional: She is oriented to person, place, and time. She appears well-developed and well-nourished.  HENT:  Head: Normocephalic and atraumatic.  Eyes: EOM are normal. Pupils are equal, round, and reactive to light.  Neck: Normal range of motion. Neck supple. No thyromegaly present.  Cardiovascular: Normal rate, regular rhythm and normal heart sounds.   No murmur heard. Pulmonary/Chest: Breath sounds normal. No respiratory distress. She has no wheezes. She has no rales.  Abdominal: Soft. Bowel sounds are normal. She exhibits no distension and no mass. There is no tenderness. There is no rebound and no guarding.  Musculoskeletal: Normal range of motion. She exhibits no edema.  Lymphadenopathy:    She has no cervical adenopathy.  Neurological: She is alert and oriented to person, place, and time. She displays normal reflexes. No cranial nerve deficit.  Skin: No rash noted.  Psychiatric: She has a normal mood and affect. Her behavior is normal. Judgment and thought content normal.          Assessment & Plan:  #1 health maintenance. Flu vaccine given. She cannot receive Prevnar 13 secondary to previous allergic reaction. Colonoscopy up to date. #2 GERD. She's on twice a day Nexium. She's tried scaling back without success in the past #3 hyperlipidemia. Recheck lipid and hepatic panel. #4 hypertension stable. Continue current medications. Check basic metabolic panel #5 hypothyroidism. Check TSH #6 history of mild anemia. Recent iron levels in the past year normal. Check B12 level on chronic PPI use. #7 history of low vitamin D. Check 25-hydroxy vitamin D levels. Continue regular supplementation

## 2013-12-26 ENCOUNTER — Encounter: Payer: Self-pay | Admitting: Family Medicine

## 2013-12-30 ENCOUNTER — Other Ambulatory Visit: Payer: Self-pay | Admitting: Internal Medicine

## 2014-01-08 DIAGNOSIS — L820 Inflamed seborrheic keratosis: Secondary | ICD-10-CM | POA: Diagnosis not present

## 2014-01-20 DIAGNOSIS — H52203 Unspecified astigmatism, bilateral: Secondary | ICD-10-CM | POA: Diagnosis not present

## 2014-01-20 DIAGNOSIS — H04123 Dry eye syndrome of bilateral lacrimal glands: Secondary | ICD-10-CM | POA: Diagnosis not present

## 2014-02-07 ENCOUNTER — Other Ambulatory Visit (HOSPITAL_COMMUNITY): Payer: Self-pay | Admitting: *Deleted

## 2014-02-07 DIAGNOSIS — I6523 Occlusion and stenosis of bilateral carotid arteries: Secondary | ICD-10-CM

## 2014-02-17 ENCOUNTER — Encounter (HOSPITAL_COMMUNITY): Payer: Medicare Other

## 2014-02-20 ENCOUNTER — Encounter (HOSPITAL_COMMUNITY): Payer: Medicare Other

## 2014-03-06 ENCOUNTER — Ambulatory Visit (HOSPITAL_COMMUNITY): Payer: Medicare Other | Attending: Internal Medicine | Admitting: Cardiology

## 2014-03-06 DIAGNOSIS — I10 Essential (primary) hypertension: Secondary | ICD-10-CM | POA: Diagnosis not present

## 2014-03-06 DIAGNOSIS — I6523 Occlusion and stenosis of bilateral carotid arteries: Secondary | ICD-10-CM

## 2014-03-06 DIAGNOSIS — I6529 Occlusion and stenosis of unspecified carotid artery: Secondary | ICD-10-CM | POA: Diagnosis not present

## 2014-03-06 DIAGNOSIS — E785 Hyperlipidemia, unspecified: Secondary | ICD-10-CM | POA: Diagnosis not present

## 2014-03-06 NOTE — Progress Notes (Signed)
Carotid duplex performed 

## 2014-04-09 ENCOUNTER — Encounter: Payer: Self-pay | Admitting: Family Medicine

## 2014-04-23 ENCOUNTER — Other Ambulatory Visit (INDEPENDENT_AMBULATORY_CARE_PROVIDER_SITE_OTHER): Payer: Medicare Other

## 2014-04-23 DIAGNOSIS — D72829 Elevated white blood cell count, unspecified: Secondary | ICD-10-CM | POA: Diagnosis not present

## 2014-04-23 LAB — CBC WITH DIFFERENTIAL/PLATELET
Basophils Absolute: 0 10*3/uL (ref 0.0–0.1)
Basophils Relative: 0.6 % (ref 0.0–3.0)
Eosinophils Absolute: 0.2 10*3/uL (ref 0.0–0.7)
Eosinophils Relative: 2.2 % (ref 0.0–5.0)
HCT: 36.1 % (ref 36.0–46.0)
Hemoglobin: 12.4 g/dL (ref 12.0–15.0)
Lymphocytes Relative: 37.3 % (ref 12.0–46.0)
Lymphs Abs: 3.1 10*3/uL (ref 0.7–4.0)
MCHC: 34.2 g/dL (ref 30.0–36.0)
MCV: 94.9 fl (ref 78.0–100.0)
Monocytes Absolute: 0.9 10*3/uL (ref 0.1–1.0)
Monocytes Relative: 10.7 % (ref 3.0–12.0)
Neutro Abs: 4.1 10*3/uL (ref 1.4–7.7)
Neutrophils Relative %: 49.2 % (ref 43.0–77.0)
Platelets: 255 10*3/uL (ref 150.0–400.0)
RBC: 3.81 Mil/uL — ABNORMAL LOW (ref 3.87–5.11)
RDW: 12.6 % (ref 11.5–15.5)
WBC: 8.3 10*3/uL (ref 4.0–10.5)

## 2014-05-16 ENCOUNTER — Encounter: Payer: Self-pay | Admitting: Gastroenterology

## 2014-05-29 ENCOUNTER — Telehealth: Payer: Self-pay | Admitting: *Deleted

## 2014-05-29 MED ORDER — METOCLOPRAMIDE HCL 5 MG PO TABS: 5.0000 mg | ORAL_TABLET | Freq: Two times a day (BID) | ORAL | Status: DC

## 2014-05-29 NOTE — Telephone Encounter (Signed)
metoCLOPramide (REGLAN) 5 MG tablet  Medication   Date: 05/31/2013  Department: [facility]: Ricard Dillon, MD      Order Providers    Prescribing Provider Encounter Provider   Ricard Dillon, MD Ricard Dillon, MD    Medication Detail      Disp Refills Start End     metoCLOPramide (REGLAN) 5 MG tablet 60 tablet 11 05/31/2013     Sig: take 1 tablet by mouth twice a day with meals    E-Prescribing Status: Receipt confirmed by pharmacy (05/31/2013 1:08 PM EST)     Pharmacy    [id number] [place] [place], [place] Encounter   Priority and Order Details

## 2014-05-29 NOTE — Telephone Encounter (Signed)
Rx sent to pharmacy   

## 2014-05-30 ENCOUNTER — Telehealth: Payer: Self-pay | Admitting: Gastroenterology

## 2014-05-30 NOTE — Telephone Encounter (Signed)
Patient advised  that Dr. Fuller Plan is aware of her age and does want her to have her recall colon as recommended

## 2014-06-26 ENCOUNTER — Other Ambulatory Visit: Payer: Self-pay | Admitting: Family Medicine

## 2014-06-26 NOTE — Telephone Encounter (Signed)
Last visit 12/25/13 Last refill 12/25/13 #30 5 refill

## 2014-06-29 NOTE — Telephone Encounter (Signed)
Refill for 6 months. 

## 2014-07-07 ENCOUNTER — Other Ambulatory Visit: Payer: Self-pay | Admitting: Family Medicine

## 2014-07-21 ENCOUNTER — Telehealth: Payer: Self-pay | Admitting: Family Medicine

## 2014-07-21 NOTE — Telephone Encounter (Signed)
Last refill 06/30/14 #30 5 refill Last visit 12/25/13

## 2014-07-21 NOTE — Telephone Encounter (Signed)
Per pharm this will be 10 day early refill and ins may not pay

## 2014-07-21 NOTE — Telephone Encounter (Signed)
We cannot fill early.  If she is taking more than prescribed we really need to look at stopping this and getting away from medications with abuse potential.

## 2014-07-21 NOTE — Telephone Encounter (Signed)
Pt is aware.  

## 2014-07-21 NOTE — Telephone Encounter (Signed)
Pt calling to get an early refill on  ALPRAZolam (XANAX) 0.25 MG tablet.  Pt states things have been stressfull at work and she might have taken more than prescribed.  [facility] [address]

## 2014-08-24 ENCOUNTER — Other Ambulatory Visit: Payer: Self-pay | Admitting: Family Medicine

## 2014-08-29 DIAGNOSIS — Z803 Family history of malignant neoplasm of breast: Secondary | ICD-10-CM | POA: Diagnosis not present

## 2014-08-29 DIAGNOSIS — Z1231 Encounter for screening mammogram for malignant neoplasm of breast: Secondary | ICD-10-CM | POA: Diagnosis not present

## 2014-09-03 ENCOUNTER — Telehealth: Payer: Self-pay

## 2014-09-03 MED ORDER — LOSARTAN POTASSIUM 100 MG PO TABS: 100.0000 mg | ORAL_TABLET | Freq: Every day | ORAL | Status: DC

## 2014-09-03 NOTE — Telephone Encounter (Signed)
[place], [address]: losartan (COZAAR) 100 MG tablet

## 2014-09-03 NOTE — Telephone Encounter (Signed)
Rx sent to pharmacy   

## 2014-09-12 DIAGNOSIS — N9089 Other specified noninflammatory disorders of vulva and perineum: Secondary | ICD-10-CM | POA: Diagnosis not present

## 2014-09-22 ENCOUNTER — Other Ambulatory Visit: Payer: Self-pay | Admitting: Family Medicine

## 2014-09-24 ENCOUNTER — Ambulatory Visit (INDEPENDENT_AMBULATORY_CARE_PROVIDER_SITE_OTHER): Payer: Medicare Other | Admitting: Family Medicine

## 2014-09-24 ENCOUNTER — Encounter: Payer: Self-pay | Admitting: Family Medicine

## 2014-09-24 VITALS — BP 130/70 | HR 60 | Temp 97.4°F | Wt 126.0 lb

## 2014-09-24 DIAGNOSIS — Z8639 Personal history of other endocrine, nutritional and metabolic disease: Secondary | ICD-10-CM

## 2014-09-24 DIAGNOSIS — R5383 Other fatigue: Secondary | ICD-10-CM

## 2014-09-24 LAB — CBC WITH DIFFERENTIAL/PLATELET
Basophils Absolute: 0 10*3/uL (ref 0.0–0.1)
Basophils Relative: 0.4 % (ref 0.0–3.0)
Eosinophils Absolute: 0.2 10*3/uL (ref 0.0–0.7)
Eosinophils Relative: 2.6 % (ref 0.0–5.0)
HCT: 37.3 % (ref 36.0–46.0)
Hemoglobin: 12.3 g/dL (ref 12.0–15.0)
Lymphocytes Relative: 38.9 % (ref 12.0–46.0)
Lymphs Abs: 3.6 10*3/uL (ref 0.7–4.0)
MCHC: 33.1 g/dL (ref 30.0–36.0)
MCV: 97 fl (ref 78.0–100.0)
Monocytes Absolute: 0.9 10*3/uL (ref 0.1–1.0)
Monocytes Relative: 9.7 % (ref 3.0–12.0)
Neutro Abs: 4.5 10*3/uL (ref 1.4–7.7)
Neutrophils Relative %: 48.4 % (ref 43.0–77.0)
Platelets: 247 10*3/uL (ref 150.0–400.0)
RBC: 3.85 Mil/uL — ABNORMAL LOW (ref 3.87–5.11)
RDW: 13 % (ref 11.5–15.5)
WBC: 9.3 10*3/uL (ref 4.0–10.5)

## 2014-09-24 NOTE — Patient Instructions (Signed)

## 2014-09-24 NOTE — Progress Notes (Signed)
Pre visit review using our clinic review tool, if applicable. No additional management support is needed unless otherwise documented below in the visit note. 

## 2014-09-24 NOTE — Progress Notes (Signed)
Subjective:    Patient ID: Rebecca [name], female    DOB: 1932-05-13, 81 y.o.   MRN: [id number]  HPI Patient seen with nonspecific fatigue over the past few weeks. She was concerned that her "iron" may be low. She apparently has had iron deficiency in the remote past: Back in 2012. She had minimal nosebleed on the 19th but none since then. No change in appetite and no weight changes. She is scheduled to see GI soon in August for repeat colonoscopy. She's not had any recent melanoma or bloody stools. No abdominal pain. No dizziness or orthostatic symptoms.  Regarding her fatigue, she had normal thyroid functions last fall. She walks some for exercise. No recent chest pains. No dyspnea. No fevers or chills  Past Medical History  Diagnosis Date  . Hypertension   . Diverticulosis   . Esophageal stricture   . Anxiety   . Hemorrhoids   . Gastroparesis   . Anemia   . Hyperlipidemia   . Rectal ulcer   . Diverticulitis   . Tubular adenoma of colon 05/2009  . GERD (gastroesophageal reflux disease)   . Hypertension   . IBS (irritable bowel syndrome)    Past Surgical History  Procedure Laterality Date  . Tonsillectomy    . Appendectomy    . Arcuate keratectomy    . Knee surgery Bilateral   . Hernia repair      umbilical  . Double pallital tori bone removed     . Morton's neuroma on rt foot and platar facial release    . Mouth surgery      "bone shaved off roof of mouth"  . Salivary gland surgery      removal    reports that she has never smoked. She has never used smokeless tobacco. She reports that she drinks alcohol. She reports that she does not use illicit drugs. family history includes Anuerysm in her father; Breast cancer in her daughter; Cancer in her daughter; Colon cancer in her maternal aunt and paternal grandmother; Colon polyps in her brother, daughter, and sister; Diabetes in her brother and mother; Heart disease in her brother, father, and mother; Hyperlipidemia in  her brother and mother; Hypertension in her brother, father, and mother; Lung cancer in her sister; Migraines in her daughter. There is no history of Crohn's disease. Allergies  Allergen Reactions  . Ciprofloxacin     REACTION: Thrush  . Codeine   . Lactose Intolerance (Gi)   . Pneumococcal Vaccine Polyvalent     REACTION: RED AND RAISED RASH ON ARM  . Sulfamethoxazole-Trimethoprim   . Tramadol Hcl   . Adhesive [Tape] Rash      Review of Systems  Constitutional: Positive for fatigue. Negative for fever, chills, appetite change and unexpected weight change.  Respiratory: Negative for shortness of breath.   Cardiovascular: Negative for chest pain.  Gastrointestinal: Negative for nausea, vomiting, abdominal pain and blood in stool.  Neurological: Negative for dizziness.       Objective:   Physical Exam  Constitutional: She appears well-developed and well-nourished.  Cardiovascular: Normal rate and regular rhythm.   Pulmonary/Chest: Effort normal and breath sounds normal. No respiratory distress. She has no wheezes. She has no rales.  Musculoskeletal: She exhibits no edema.  Skin:  Nailbeds are pink          Assessment & Plan:  Fatigue. History of iron deficiency. Patient is concerned about recurrence, though clinically doubt she has significant anemia. Will check CBC. She has  scheduled follow-up with GI already made

## 2014-10-02 ENCOUNTER — Ambulatory Visit (INDEPENDENT_AMBULATORY_CARE_PROVIDER_SITE_OTHER): Payer: Medicare Other | Admitting: Internal Medicine

## 2014-10-02 ENCOUNTER — Encounter: Payer: Self-pay | Admitting: Internal Medicine

## 2014-10-02 VITALS — BP 130/70 | HR 65 | Temp 98.3°F | Resp 18 | Ht 62.25 in | Wt 133.0 lb

## 2014-10-02 DIAGNOSIS — H109 Unspecified conjunctivitis: Secondary | ICD-10-CM | POA: Diagnosis not present

## 2014-10-02 MED ORDER — NAPHAZOLINE HCL 0.1 % OP SOLN
2.0000 [drp] | Freq: Four times a day (QID) | OPHTHALMIC | Status: DC | PRN
Start: 2014-10-02 — End: 2014-12-23

## 2014-10-02 NOTE — Progress Notes (Signed)
Pre visit review using our clinic review tool, if applicable. No additional management support is needed unless otherwise documented below in the visit note. 

## 2014-10-02 NOTE — Patient Instructions (Signed)
Use  eyedrops as prescribed  HOME CARE INSTRUCTIONS  To ease discomfort, apply a cool, clean wash cloth to your eye for 10 to 20 minutes, 3 to 4 times a day.  Gently wipe away any drainage from the eye with a warm, wet washcloth or a cotton ball.  Wash your hands often with soap and use paper towels to dry.  Do not share towels or washcloths. This may spread the infection.  Change or wash your pillowcase every day.  You should not use eye make-up until the infection is gone.

## 2014-10-02 NOTE — Progress Notes (Signed)
Subjective:    Patient ID: Rebecca [name], female    DOB: 10-08-1932, 81 y.o.   MRN: [id number]  HPI  81 year old patient who presents with a 2 day history of some redness and soreness involving the medial aspect of her right eye.  She describes some dry crusty exudate in the morning today.  No change in visual acuity. Allergies include Cipro and sulfa antibiotics  Past Medical History  Diagnosis Date  . Hypertension   . Diverticulosis   . Esophageal stricture   . Anxiety   . Hemorrhoids   . Gastroparesis   . Anemia   . Hyperlipidemia   . Rectal ulcer   . Diverticulitis   . Tubular adenoma of colon 05/2009  . GERD (gastroesophageal reflux disease)   . Hypertension   . IBS (irritable bowel syndrome)     History   Social History  . Marital Status: Widowed    Spouse Name: N/A  . Number of Children: N/A  . Years of Education: N/A   Occupational History  . Not on file.   Social History Main Topics  . Smoking status: Never Smoker   . Smokeless tobacco: Never Used  . Alcohol Use: Yes     Comment: rare  . Drug Use: No  . Sexual Activity: Yes   Other Topics Concern  . Not on file   Social History Narrative    Past Surgical History  Procedure Laterality Date  . Tonsillectomy    . Appendectomy    . Arcuate keratectomy    . Knee surgery Bilateral   . Hernia repair      umbilical  . Double pallital tori bone removed     . Morton's neuroma on rt foot and platar facial release    . Mouth surgery      "bone shaved off roof of mouth"  . Salivary gland surgery      removal    Family History  Problem Relation Age of Onset  . Crohn's disease Neg Hx   . Migraines Daughter   . Colon polyps Sister   . Lung cancer Sister   . Colon polyps Brother   . Diabetes Brother   . Hyperlipidemia Brother   . Hypertension Brother   . Colon polyps Daughter   . Breast cancer Daughter   . Colon cancer Maternal Aunt   . Diabetes Mother   . Heart disease Mother   .  Hyperlipidemia Mother   . Hypertension Mother   . Cancer Daughter     salivary  . Anuerysm Father   . Heart disease Father   . Hypertension Father   . Colon cancer Paternal Grandmother   . Heart disease Brother     Allergies  Allergen Reactions  . Ciprofloxacin     REACTION: Thrush  . Codeine   . Lactose Intolerance (Gi)   . Pneumococcal Vaccine Polyvalent     REACTION: RED AND RAISED RASH ON ARM  . Sulfamethoxazole-Trimethoprim   . Tramadol Hcl   . Adhesive [Tape] Rash    Current Outpatient Prescriptions on File Prior to Visit  Medication Sig Dispense Refill  . ALPRAZolam (XANAX) 0.25 MG tablet take 1 tablet by mouth at bedtime if needed for anxiety 30 tablet 5  . Calcium Carbonate-Vitamin D (CALCIUM-VITAMIN D) 500-200 MG-UNIT per tablet Take 1 tablet by mouth 2 (two) times daily with a meal.      . Cholecalciferol (VITAMIN D3) 2000 UNITS TABS Take 1 tablet by mouth every morning.     [name] Kitchen  esomeprazole (NEXIUM) 20 MG capsule Take 20 mg by mouth 2 (two) times daily before a meal.    . ezetimibe-simvastatin (VYTORIN) 10-20 MG per tablet Take 1 tablet by mouth every morning. 90 tablet 1  . gabapentin (NEURONTIN) 100 MG capsule take 1 capsule by mouth at bedtime if needed 30 capsule 5  . IFEREX 150 150 MG capsule take 1 capsule by mouth once daily 30 capsule 5  . losartan (COZAAR) 100 MG tablet Take 1 tablet (100 mg total) by mouth daily. 90 tablet 0  . metoCLOPramide (REGLAN) 5 MG tablet Take 1 tablet (5 mg total) by mouth 2 (two) times daily with a meal. 60 tablet 5  . ranitidine (ZANTAC) 300 MG tablet Take 1 tablet (300 mg total) by mouth at bedtime. 30 tablet 11  . SYNTHROID 88 MCG tablet take 1 tablet by mouth once daily 30 tablet 3  . vitamin B-12 (CYANOCOBALAMIN) 500 MCG tablet Take 500 mcg by mouth every morning.     . [DISCONTINUED] olmesartan (BENICAR) 20 MG tablet Take 20 mg by mouth daily.       No current facility-administered medications on file prior to visit.     BP 130/70 mmHg  Pulse 65  Temp(Src) 98.3 F (36.8 C) (Oral)  Resp 18  Ht 5' 2.25" (1.581 m)  Wt 133 lb (60.328 kg)  BMI 24.14 kg/m2  SpO2 99%     Review of Systems  Constitutional: Negative.   HENT: Negative for congestion, dental problem, hearing loss, rhinorrhea, sinus pressure, sore throat and tinnitus.   Eyes: Positive for discharge and redness. Negative for pain and visual disturbance.  Respiratory: Negative for cough and shortness of breath.   Cardiovascular: Negative for chest pain, palpitations and leg swelling.  Gastrointestinal: Negative for nausea, vomiting, abdominal pain, diarrhea, constipation, blood in stool and abdominal distention.  Genitourinary: Negative for dysuria, urgency, frequency, hematuria, flank pain, vaginal bleeding, vaginal discharge, difficulty urinating, vaginal pain and pelvic pain.  Musculoskeletal: Negative for joint swelling, arthralgias and gait problem.  Skin: Negative for rash.  Neurological: Negative for dizziness, syncope, speech difficulty, weakness, numbness and headaches.  Hematological: Negative for adenopathy.  Psychiatric/Behavioral: Negative for behavioral problems, dysphoric mood and agitation. The patient is not nervous/anxious.        Objective:   Physical Exam  Constitutional: She appears well-developed and well-nourished.  HENT:  Right Ear: External ear normal.  Left Ear: External ear normal.  Mouth/Throat: Oropharynx is clear and moist.  Eyes: EOM are normal. Pupils are equal, round, and reactive to light. Right eye exhibits no discharge. Left eye exhibits no discharge.  Very mild erythema of the right medial conjunctiva.  Lids were normal          Assessment & Plan:   Mild conjunctivitis.  Will treat  symptomatically

## 2014-10-17 ENCOUNTER — Telehealth: Payer: Self-pay | Admitting: Family Medicine

## 2014-10-17 MED ORDER — EZETIMIBE-SIMVASTATIN 10-20 MG PO TABS: 1.0000 | ORAL_TABLET | Freq: Every morning | ORAL | Status: DC

## 2014-10-17 NOTE — Telephone Encounter (Signed)
Rx done. 

## 2014-10-17 NOTE — Telephone Encounter (Signed)
Refill for 6 months. 

## 2014-10-17 NOTE — Telephone Encounter (Signed)
[id number] [address] requesting approval for refill of ezetimibe-simvastatin (VYTORIN) 10-20 MG per tablet

## 2014-10-22 ENCOUNTER — Encounter: Payer: Self-pay | Admitting: Family Medicine

## 2014-10-23 ENCOUNTER — Other Ambulatory Visit: Payer: Self-pay

## 2014-10-23 MED ORDER — MELOXICAM 15 MG PO TABS: 15.0000 mg | ORAL_TABLET | Freq: Every day | ORAL | Status: DC

## 2014-10-23 MED ORDER — CYCLOBENZAPRINE HCL 10 MG PO TABS: 10.0000 mg | ORAL_TABLET | Freq: Every evening | ORAL | Status: DC | PRN

## 2014-11-03 ENCOUNTER — Encounter: Payer: Self-pay | Admitting: Gastroenterology

## 2014-11-03 ENCOUNTER — Ambulatory Visit (INDEPENDENT_AMBULATORY_CARE_PROVIDER_SITE_OTHER): Payer: Medicare Other | Admitting: Gastroenterology

## 2014-11-03 VITALS — BP 130/70 | HR 64 | Ht 62.25 in | Wt 125.2 lb

## 2014-11-03 DIAGNOSIS — R1314 Dysphagia, pharyngoesophageal phase: Secondary | ICD-10-CM | POA: Diagnosis not present

## 2014-11-03 DIAGNOSIS — R1310 Dysphagia, unspecified: Secondary | ICD-10-CM

## 2014-11-03 DIAGNOSIS — K219 Gastro-esophageal reflux disease without esophagitis: Secondary | ICD-10-CM | POA: Diagnosis not present

## 2014-11-03 DIAGNOSIS — R1319 Other dysphagia: Secondary | ICD-10-CM

## 2014-11-03 DIAGNOSIS — Z86010 Personal history of colonic polyps: Secondary | ICD-10-CM

## 2014-11-03 DIAGNOSIS — K3184 Gastroparesis: Secondary | ICD-10-CM

## 2014-11-03 MED ORDER — NA SULFATE-K SULFATE-MG SULF 17.5-3.13-1.6 GM/177ML PO SOLN: 1.0000 | Freq: Once | ORAL | Status: DC

## 2014-11-03 NOTE — Progress Notes (Signed)
    History of Present Illness: This is a 81 year old female returning for evaluation of personal history of adenomatous colon polyps, GERD, gastroparesis and dysphagia secondary to esophageal dysmotility. Prior endoscopies and BA esophagram have not demonstrated a stricture. Barium esophagram in 11/2012 demonstrated diffuse esophageal spasms and a small Zenker's diverticulum. 13 mm barium tablet passed without difficulty. Her dysphagia symptoms are stable. Occasionally she has difficulty swallowing small pieces of solid foods. Reflux symptoms well controlled on current medications. Denies weight loss, abdominal pain, constipation, diarrhea, change in stool caliber, melena, hematochezia, nausea, vomiting, reflux symptoms, chest pain.  Review of Systems: Pertinent positive and negative review of systems were noted in the above HPI section. All other review of systems were otherwise negative.  Current Medications, Allergies, Past Medical History, Past Surgical History, Family History and Social History were reviewed in [employer] record.  Physical Exam: General: Well developed, well nourished, no acute distress Head: Normocephalic and atraumatic Eyes:  sclerae anicteric, EOMI Ears: Normal auditory acuity Mouth: No deformity or lesions Neck: Supple, no masses or thyromegaly Lungs: Clear throughout to auscultation Heart: Regular rate and rhythm; no murmurs, rubs or bruits Abdomen: Soft, non tender and non distended. No masses, hepatosplenomegaly or hernias noted. Normal Bowel sounds Rectal: Deferred to colonoscopy Musculoskeletal: Symmetrical with no gross deformities  Skin: No lesions on visible extremities Pulses:  Normal pulses noted Extremities: No clubbing, cyanosis, edema or deformities noted Neurological: Alert oriented x 4, grossly nonfocal Cervical Nodes:  No significant cervical adenopathy Inguinal Nodes: No significant inguinal adenopathy Psychological:  Alert  and cooperative. Normal mood and affect  Assessment and Recommendations:  1. Personal history of adenomatous colon polyps. She is due for a five-year interval surveillance colonoscopy and wishes to proceed. The risks (including bleeding, perforation, infection, missed lesions, medication reactions and possible hospitalization or surgery if complications occur), benefits, and alternatives to colonoscopy with possible biopsy and possible polypectomy were discussed with the patient and they consent to proceed.   2. GERD, gastroparesis, esophageal motility disorder, dysphagia, small Zenker's diverticulum. Symptoms stable over the past few years. Continue Nexium, Zantac, metoclopramide as currently taking. Outlined results of her evaluation 2 years ago for dysphagia showing esophageal dysmotility and no esophageal stricture. Pt interested in having EGD and dilation however endoscopy with dilation will not help her dysmotility. Schedule BA esophagram. Schedule EGD if indicated following BA esophagram

## 2014-11-03 NOTE — Patient Instructions (Signed)
You have been scheduled for a Barium Esophogram at [facility] (1st floor of the hospital) on 11/06/14 at 9:30am. Please arrive 15 minutes prior to your appointment for registration. Make certain not to have anything to eat or drink 6 hours prior to your test. If you need to reschedule for any reason, please contact radiology at [phone] to do so. __________________________________________________________________ A barium swallow is an examination that concentrates on views of the esophagus. This tends to be a double contrast exam (barium and two liquids which, when combined, create a gas to distend the wall of the oesophagus) or single contrast (non-ionic iodine based). The study is usually tailored to your symptoms so a good history is essential. Attention is paid during the study to the form, structure and configuration of the esophagus, looking for functional disorders (such as aspiration, dysphagia, achalasia, motility and reflux) EXAMINATION You may be asked to change into a gown, depending on the type of swallow being performed. A radiologist and radiographer will perform the procedure. The radiologist will advise you of the type of contrast selected for your procedure and direct you during the exam. You will be asked to stand, sit or lie in several different positions and to hold a small amount of fluid in your mouth before being asked to swallow while the imaging is performed .In some instances you may be asked to swallow barium coated marshmallows to assess the motility of a solid food bolus. The exam can be recorded as a digital or video fluoroscopy procedure. POST PROCEDURE It will take 1-2 days for the barium to pass through your system. To facilitate this, it is important, unless otherwise directed, to increase your fluids for the next 24-48hrs and to resume your normal diet.  This test typically takes about 30 minutes to  perform. __________________________________________________________________________________ Rebecca [name] have been scheduled for a colonoscopy. Please follow written instructions given to you at your visit today.  Please pick up your prep supplies at the pharmacy within the next 1-3 days. If you use inhalers (even only as needed), please bring them with you on the day of your procedure. Your physician has requested that you go to www.startemmi.com and enter the access code given to you at your visit today. This web site gives a general overview about your procedure. However, you should still follow specific instructions given to you by our office regarding your preparation for the procedure.  Thank you for choosing me and [facility].  Pricilla Riffle. Dagoberto Ligas., MD., [facility] Reg[facility]

## 2014-11-06 ENCOUNTER — Ambulatory Visit (HOSPITAL_COMMUNITY): Payer: PRIVATE HEALTH INSURANCE

## 2014-11-11 DIAGNOSIS — L738 Other specified follicular disorders: Secondary | ICD-10-CM | POA: Diagnosis not present

## 2014-11-13 ENCOUNTER — Ambulatory Visit (HOSPITAL_COMMUNITY)
Admission: RE | Admit: 2014-11-13 | Discharge: 2014-11-13 | Disposition: A | Discharge status: Home / Self Care | Payer: Medicare Other | Source: Ambulatory Visit | Attending: Gastroenterology | Admitting: Gastroenterology

## 2014-11-13 DIAGNOSIS — R1319 Other dysphagia: Secondary | ICD-10-CM

## 2014-11-13 DIAGNOSIS — R4702 Dysphasia: Secondary | ICD-10-CM | POA: Diagnosis present

## 2014-11-13 DIAGNOSIS — K224 Dyskinesia of esophagus: Secondary | ICD-10-CM | POA: Diagnosis not present

## 2014-11-13 DIAGNOSIS — K222 Esophageal obstruction: Secondary | ICD-10-CM | POA: Diagnosis not present

## 2014-11-13 DIAGNOSIS — K219 Gastro-esophageal reflux disease without esophagitis: Secondary | ICD-10-CM | POA: Diagnosis not present

## 2014-11-13 DIAGNOSIS — H10413 Chronic giant papillary conjunctivitis, bilateral: Secondary | ICD-10-CM | POA: Diagnosis not present

## 2014-11-13 DIAGNOSIS — R1310 Dysphagia, unspecified: Secondary | ICD-10-CM

## 2014-11-21 ENCOUNTER — Ambulatory Visit (INDEPENDENT_AMBULATORY_CARE_PROVIDER_SITE_OTHER): Payer: Medicare Other | Admitting: Family Medicine

## 2014-11-21 ENCOUNTER — Encounter: Payer: Self-pay | Admitting: Family Medicine

## 2014-11-21 VITALS — BP 130/76 | HR 57 | Temp 97.7°F | Wt 124.0 lb

## 2014-11-21 DIAGNOSIS — J209 Acute bronchitis, unspecified: Secondary | ICD-10-CM | POA: Diagnosis not present

## 2014-11-21 MED ORDER — BENZONATATE 200 MG PO CAPS: 200.0000 mg | ORAL_CAPSULE | Freq: Three times a day (TID) | ORAL | Status: DC | PRN

## 2014-11-21 NOTE — Progress Notes (Signed)
Pre visit review using our clinic review tool, if applicable. No additional management support is needed unless otherwise documented below in the visit note. 

## 2014-11-21 NOTE — Patient Instructions (Signed)
Consider over the counter Flonase or Nasacort for nasal congestion Follow up for any fever or increased shortness of breath- or if cough no better in 2 weeks.

## 2014-11-21 NOTE — Progress Notes (Signed)
Subjective:    Patient ID: Rebecca [name], female    DOB: Oct 14, 1932, 81 y.o.   MRN: [id number]  HPI  patient seen with cough. Mostly dry and occasionally productive of white sputum. She's describes typical head cold back at the end of July and subsequently developed cough. She has occasional hoarseness and has noticed some postnasal drip symptoms. No active reflux symptoms. She has taken over-the-counter Mucinex. Never had any fever or chills. Nonsmoker. Good appetite. No weight changes. No dyspnea. No hemoptysis. No pleuritic pain. Not aware of any wheezing. No exacerbating factors. Symptoms tend to be worse during the day. No ACE inhibitor use.  Past Medical History  Diagnosis Date  . Hypertension   . Diverticulosis   . Anxiety   . Hemorrhoids   . Gastroparesis   . Anemia   . Hyperlipidemia   . Rectal ulcer   . Diverticulitis   . Tubular adenoma of colon 05/2009  . GERD (gastroesophageal reflux disease)   . Hypertension   . IBS (irritable bowel syndrome)    Past Surgical History  Procedure Laterality Date  . Tonsillectomy    . Appendectomy    . Arcuate keratectomy    . Knee surgery Bilateral   . Hernia repair      umbilical  . Double pallital tori bone removed     . Morton's neuroma on rt foot and platar facial release    . Mouth surgery      "bone shaved off roof of mouth"  . Salivary gland surgery      removal    reports that she has never smoked. She has never used smokeless tobacco. She reports that she drinks alcohol. She reports that she does not use illicit drugs. family history includes Anuerysm in her father; Breast cancer in her daughter; Cancer in her daughter; Colon cancer in her maternal aunt and paternal grandmother; Colon polyps in her brother, daughter, and sister; Diabetes in her brother and mother; Heart disease in her brother, father, and mother; Hyperlipidemia in her brother and mother; Hypertension in her brother, father, and mother; Lung cancer in  her sister; Migraines in her daughter. There is no history of Crohn's disease. Allergies  Allergen Reactions  . Ciprofloxacin     REACTION: Thrush  . Codeine   . Lactose Intolerance (Gi)   . Pneumococcal Vaccine Polyvalent     REACTION: RED AND RAISED RASH ON ARM  . Sulfamethoxazole-Trimethoprim   . Tramadol Hcl   . Adhesive [Tape] Rash      Review of Systems  Constitutional: Negative for fever, chills, appetite change and unexpected weight change.  HENT: Negative for congestion and sore throat.   Respiratory: Positive for cough. Negative for shortness of breath and wheezing.   Cardiovascular: Negative for chest pain.       Objective:   Physical Exam  Constitutional: She appears well-developed and well-nourished.  HENT:  Right Ear: External ear normal.  Left Ear: External ear normal.  Mouth/Throat: Oropharynx is clear and moist.  Neck: Neck supple. No thyromegaly present.  Cardiovascular: Normal rate and regular rhythm.   Pulmonary/Chest: Effort normal and breath sounds normal. No respiratory distress. She has no wheezes. She has no rales.  Musculoskeletal: She exhibits no edema.  Lymphadenopathy:    She has no cervical adenopathy.          Assessment & Plan:   Cough. Suspect resolving viral bronchitis. Nonfocal exam. Tessalon Perles 200 mg every 8 hours as needed for cough. Follow-up  promptly for any fever or shortness of breath. Touch base if cough not resolving over the next couple weeks.

## 2014-12-01 ENCOUNTER — Other Ambulatory Visit: Payer: Self-pay | Admitting: Family Medicine

## 2014-12-01 DIAGNOSIS — H04123 Dry eye syndrome of bilateral lacrimal glands: Secondary | ICD-10-CM | POA: Diagnosis not present

## 2014-12-05 ENCOUNTER — Other Ambulatory Visit: Payer: Self-pay | Admitting: Family Medicine

## 2014-12-15 ENCOUNTER — Encounter: Payer: Self-pay | Admitting: Gastroenterology

## 2014-12-15 ENCOUNTER — Ambulatory Visit (AMBULATORY_SURGERY_CENTER): Payer: Medicare Other | Admitting: Gastroenterology

## 2014-12-15 VITALS — BP 121/61 | HR 51 | Temp 97.5°F | Resp 16 | Ht 62.0 in | Wt 125.0 lb

## 2014-12-15 DIAGNOSIS — K5669 Other intestinal obstruction: Secondary | ICD-10-CM | POA: Diagnosis not present

## 2014-12-15 DIAGNOSIS — I10 Essential (primary) hypertension: Secondary | ICD-10-CM | POA: Diagnosis not present

## 2014-12-15 DIAGNOSIS — D123 Benign neoplasm of transverse colon: Secondary | ICD-10-CM

## 2014-12-15 DIAGNOSIS — K56699 Other intestinal obstruction unspecified as to partial versus complete obstruction: Secondary | ICD-10-CM

## 2014-12-15 DIAGNOSIS — M545 Low back pain: Secondary | ICD-10-CM | POA: Diagnosis not present

## 2014-12-15 DIAGNOSIS — K219 Gastro-esophageal reflux disease without esophagitis: Secondary | ICD-10-CM | POA: Diagnosis not present

## 2014-12-15 DIAGNOSIS — Z86010 Personal history of colonic polyps: Secondary | ICD-10-CM | POA: Diagnosis not present

## 2014-12-15 DIAGNOSIS — E039 Hypothyroidism, unspecified: Secondary | ICD-10-CM | POA: Diagnosis not present

## 2014-12-15 DIAGNOSIS — K5140 Inflammatory polyps of colon without complications: Secondary | ICD-10-CM | POA: Diagnosis not present

## 2014-12-15 MED ORDER — AMOXICILLIN-POT CLAVULANATE 875-125 MG PO TABS: 1.0000 | ORAL_TABLET | Freq: Two times a day (BID) | ORAL | Status: DC

## 2014-12-15 MED ORDER — SODIUM CHLORIDE 0.9 % IV SOLN: 500.0000 mL | INTRAVENOUS | Status: DC

## 2014-12-15 MED ORDER — METRONIDAZOLE 500 MG PO TABS: 500.0000 mg | ORAL_TABLET | Freq: Two times a day (BID) | ORAL | Status: DC

## 2014-12-15 NOTE — Progress Notes (Signed)
Called to room to assist during endoscopic procedure.  Patient ID and intended procedure confirmed with present staff. Received instructions for my participation in the procedure from the performing physician.  

## 2014-12-15 NOTE — Progress Notes (Signed)
Report to PACU, RN, vss, BBS= Clear.  

## 2014-12-15 NOTE — Patient Instructions (Signed)
YOU HAD AN ENDOSCOPIC PROCEDURE TODAY AT THE [facility]:   Refer to the procedure report that was given to you for any specific questions about what was found during the examination.  If the procedure report does not answer your questions, please call your gastroenterologist to clarify.  If you requested that your care partner not be given the details of your procedure findings, then the procedure report has been included in a sealed envelope for you to review at your convenience later.  YOU SHOULD EXPECT: Some feelings of bloating in the abdomen. Passage of more gas than usual.  Walking can help get rid of the air that was put into your GI tract during the procedure and reduce the bloating. If you had a lower endoscopy (such as a colonoscopy or flexible sigmoidoscopy) you may notice spotting of blood in your stool or on the toilet paper. If you underwent a bowel prep for your procedure, you may not have a normal bowel movement for a few days.  Please Note:  You might notice some irritation and congestion in your nose or some drainage.  This is from the oxygen used during your procedure.  There is no need for concern and it should clear up in a day or so.  SYMPTOMS TO REPORT IMMEDIATELY:   Following lower endoscopy (colonoscopy or flexible sigmoidoscopy):  Excessive amounts of blood in the stool  Significant tenderness or worsening of abdominal pains  Swelling of the abdomen that is new, acute  Fever of 100F or higher   For urgent or emergent issues, a gastroenterologist can be reached at any hour by calling [phone].   DIET: Your first meal following the procedure should be a small meal and then it is ok to progress to your normal diet. Heavy or fried foods are harder to digest and may make you feel nauseous or bloated.  Likewise, meals heavy in dairy and vegetables can increase bloating.  Drink plenty of fluids but you should avoid alcoholic beverages for 24  hours.  ACTIVITY:  You should plan to take it easy for the rest of today and you should NOT DRIVE or use heavy machinery until tomorrow (because of the sedation medicines used during the test).    FOLLOW UP: Our staff will call the number listed on your records the next business day following your procedure to check on you and address any questions or concerns that you may have regarding the information given to you following your procedure. If we do not reach you, we will leave a message.  However, if you are feeling well and you are not experiencing any problems, there is no need to return our call.  We will assume that you have returned to your regular daily activities without incident.  If any biopsies were taken you will be contacted by phone or by letter within the next 1-3 weeks.  Please call us at [phone] if you have not heard about the biopsies in 3 weeks.    SIGNATURES/CONFIDENTIALITY: You and/or your care partner have signed paperwork which will be entered into your electronic medical record.  These signatures attest to the fact that that the information above on your After Visit Summary has been reviewed and is understood.  Full responsibility of the confidentiality of this discharge information lies with you and/or your care-partner. 

## 2014-12-15 NOTE — Op Note (Addendum)
[facility]  [address] [place], [id number]   COLONOSCOPY PROCEDURE REPORT  PATIENT: Rebecca [name], Rebecca [name]  MR#: [id number] BIRTHDATE: [birth year] , 81  yrs. old GENDER: female ENDOSCOPIST: Ladene Artist, MD, [facility] PROCEDURE DATE:  12/15/2014 PROCEDURE:   Colonoscopy, screening, Colonoscopy with biopsy, and Colonoscopy with snare polypectomy First Screening Colonoscopy - Avg.  risk and is 50 yrs.  old or older - No.  Prior Negative Screening - Now for repeat screening. N/A  History of Adenoma - Now for follow-up colonoscopy & has been > or = to 3 yrs.  Yes hx of adenoma.  Has been 3 or more years since last colonoscopy.  Polyps removed today? Yes ASA CLASS:   Class III INDICATIONS:Surveillance due to prior colonic neoplasia and PH Colon Adenoma. MEDICATIONS: Monitored anesthesia care and Propofol 200 mg IV DESCRIPTION OF PROCEDURE:   After the risks benefits and alternatives of the procedure were thoroughly explained, informed consent was obtained.  The digital rectal exam revealed no abnormalities of the rectum.   The LB [id number]  endoscope was introduced through the anus and advanced to the cecum, which was identified by both the appendix and ileocecal valve. No adverse events experienced.   The quality of the prep was good.  (Suprep was used)  The instrument was then slowly withdrawn as the colon was fully examined. Estimated blood loss is zero unless otherwise noted in this procedure report.    COLON FINDINGS: There was a severe and friable stricture in the sigmoid colon associated with diverticulosis.  The stricture was traversable with mild resistance. The area was edematous with spasm.  Multiple biopsies of the area were performed.   A sessile polyp measuring 5 mm in size was found in the transverse colon.  A polypectomy was performed with a cold snare.  The resection was complete, the polyp tissue was completely retrieved and sent to histology.    The examination was otherwise normal.  Retroflexed views revealed internal Grade I hemorrhoids. The time to cecum = 14.1 Withdrawal time = 9.8   The scope was withdrawn and the procedure completed.  COMPLICATIONS: There were no immediate complications.     ENDOSCOPIC IMPRESSION: 1.   Stricture assoicated with diverticulosis in the sigmoid colon; multiple biopsies performed 2.   Sessile polyp was found in the transverse colon; polypectomy was performed with a cold snare 3.   Grade l internal hemorrhoids  RECOMMENDATIONS: 1.  Await pathology results 2.  Schedule CT scan of abdomen/pelvis 3.  Augmentin 875 mg po bid and Flagyl 500 mg po bid for 10 days for possible diverticulitis  eSigned:  Ladene Artist, MD, [facility] 12/15/2014 3:21 PM Revised: 12/15/2014 3:21 PM     PATIENT NAME:  Rebecca [name], Rebecca [name] MR#: [id number]

## 2014-12-16 ENCOUNTER — Other Ambulatory Visit (INDEPENDENT_AMBULATORY_CARE_PROVIDER_SITE_OTHER): Payer: Medicare Other

## 2014-12-16 ENCOUNTER — Other Ambulatory Visit: Payer: Self-pay

## 2014-12-16 ENCOUNTER — Telehealth: Payer: Self-pay | Admitting: Gastroenterology

## 2014-12-16 ENCOUNTER — Telehealth: Payer: Self-pay | Admitting: *Deleted

## 2014-12-16 DIAGNOSIS — K5730 Diverticulosis of large intestine without perforation or abscess without bleeding: Secondary | ICD-10-CM

## 2014-12-16 DIAGNOSIS — K5669 Other intestinal obstruction: Secondary | ICD-10-CM

## 2014-12-16 DIAGNOSIS — K56699 Other intestinal obstruction unspecified as to partial versus complete obstruction: Secondary | ICD-10-CM

## 2014-12-16 LAB — BUN: BUN: 7 mg/dL (ref 6–23)

## 2014-12-16 LAB — CREATININE, SERUM: Creatinine, Ser: 0.8 mg/dL (ref 0.40–1.20)

## 2014-12-16 NOTE — Telephone Encounter (Signed)
Patient advised to crush or cut pills to make them small enough to take.   She is advised that she can also ask her pharmacist to help her with cutting them

## 2014-12-16 NOTE — Telephone Encounter (Signed)
I spoke with the pharmacist at her [facility],  No compatible liquid forms are available.  She is able to half of crush both medications Left message for patient to call back

## 2014-12-16 NOTE — Telephone Encounter (Signed)
  Follow up Call-  Call back number 12/15/2014  Post procedure Call Back phone  # [phone]  Permission to leave phone message Yes     Patient questions:  Do you have a fever, pain , or abdominal swelling? No. Pain Score  0 *  Have you tolerated food without any problems? Yes.    Have you been able to return to your normal activities? Yes.    Do you have any questions about your discharge instructions: Diet   No. Medications  No. Follow up visit  No.  Do you have questions or concerns about your Care? No.  Actions: * If pain score is 4 or above: No action needed, pain <4.

## 2014-12-18 ENCOUNTER — Telehealth: Payer: Self-pay | Admitting: Gastroenterology

## 2014-12-18 NOTE — Telephone Encounter (Signed)
Imodium bid as needed She has a CT scheduled

## 2014-12-18 NOTE — Telephone Encounter (Signed)
Patient c/o diarrhea with antibiotics.  Please advise if ok to take imodium? Today is her 4th day on it.  Please Arnoldo Hooker

## 2014-12-18 NOTE — Telephone Encounter (Signed)
Patient notified  She will call back for any additional questions or concerns 

## 2014-12-19 ENCOUNTER — Encounter: Payer: Self-pay | Admitting: Gastroenterology

## 2014-12-19 ENCOUNTER — Ambulatory Visit (INDEPENDENT_AMBULATORY_CARE_PROVIDER_SITE_OTHER)
Admission: RE | Admit: 2014-12-19 | Discharge: 2014-12-19 | Disposition: A | Discharge status: Home / Self Care | Payer: Medicare Other | Source: Ambulatory Visit | Attending: Gastroenterology | Admitting: Gastroenterology

## 2014-12-19 DIAGNOSIS — K5730 Diverticulosis of large intestine without perforation or abscess without bleeding: Secondary | ICD-10-CM | POA: Diagnosis not present

## 2014-12-19 DIAGNOSIS — K5669 Other intestinal obstruction: Secondary | ICD-10-CM | POA: Diagnosis not present

## 2014-12-19 DIAGNOSIS — K56699 Other intestinal obstruction unspecified as to partial versus complete obstruction: Secondary | ICD-10-CM

## 2014-12-19 MED ORDER — IOHEXOL 300 MG/ML  SOLN
100.0000 mL | Freq: Once | INTRAMUSCULAR | Status: AC | PRN
  Administered 2014-12-19: 100 mL via INTRAVENOUS

## 2014-12-20 ENCOUNTER — Telehealth: Payer: Self-pay | Admitting: Gastroenterology

## 2014-12-20 MED ORDER — NYSTATIN 100000 UNIT/ML MT SUSP: 5.0000 mL | Freq: Four times a day (QID) | OROMUCOSAL | Status: DC

## 2014-12-20 NOTE — Telephone Encounter (Signed)
Patient called saying she thinks she has thrush. She has recently been placed on antibiotics by Dr. Fuller Plan for possible diverticulitis. She reports having had thrush in the past while on antibiotics. She reports she has discomfort in her mouth only, not odynophagia in her chest. Will write her a script for Nystatin swish and swallow. If no benefit I asked her to call back. She agreed.

## 2014-12-23 ENCOUNTER — Encounter: Payer: Self-pay | Admitting: Internal Medicine

## 2014-12-23 ENCOUNTER — Ambulatory Visit (INDEPENDENT_AMBULATORY_CARE_PROVIDER_SITE_OTHER): Payer: Medicare Other | Admitting: Internal Medicine

## 2014-12-23 VITALS — BP 110/68 | HR 71 | Temp 98.1°F | Resp 18 | Ht 62.0 in | Wt 125.0 lb

## 2014-12-23 DIAGNOSIS — R300 Dysuria: Secondary | ICD-10-CM | POA: Diagnosis not present

## 2014-12-23 DIAGNOSIS — I10 Essential (primary) hypertension: Secondary | ICD-10-CM | POA: Diagnosis not present

## 2014-12-23 LAB — POCT URINALYSIS DIPSTICK
Bilirubin, UA: NEGATIVE
Blood, UA: NEGATIVE
Glucose, UA: NEGATIVE
Ketones, UA: NEGATIVE
Nitrite, UA: NEGATIVE
Protein, UA: NEGATIVE
Spec Grav, UA: 1.015
Urobilinogen, UA: 0.2
pH, UA: 7.5

## 2014-12-23 NOTE — Progress Notes (Signed)
Pre visit review using our clinic review tool, if applicable. No additional management support is needed unless otherwise documented below in the visit note. 

## 2014-12-23 NOTE — Progress Notes (Signed)
Subjective:    Patient ID: Rebecca [name], female    DOB: 08-11-1932, 81 y.o.   MRN: [id number]  HPI  81 year old patient who presents with a couple day history of mild dysuria.  Urinalysis revealed plus 1 leukocytes.  She had a colonoscopy approximately 8 days ago, which showed extensive diverticulosis.  She also had an abdominal CT scan.  She is completing Augmentin as well as Flagyl for possible mild diverticulitis.  CT scan revealed no striking inflammatory changes and colonic biopsies were nonspecific.  For the past few days she has had 2-3 loose bowel movements per day requiring Imodium.  Denies any abdominal pain.  GI suggested.  7 days of antibiotic therapy, but the patient was given a 10 day supply.  No fever, chills or flank pain  Past Medical History  Diagnosis Date  . Hypertension   . Diverticulosis   . Anxiety   . Hemorrhoids   . Gastroparesis   . Anemia   . Hyperlipidemia   . Rectal ulcer   . Diverticulitis   . Tubular adenoma of colon 05/2009  . GERD (gastroesophageal reflux disease)   . Hypertension   . IBS (irritable bowel syndrome)     Social History   Social History  . Marital Status: Widowed    Spouse Name: N/A  . Number of Children: N/A  . Years of Education: N/A   Occupational History  . Not on file.   Social History Main Topics  . Smoking status: Never Smoker   . Smokeless tobacco: Never Used  . Alcohol Use: Yes     Comment: rare  . Drug Use: No  . Sexual Activity: Yes   Other Topics Concern  . Not on file   Social History Narrative    Past Surgical History  Procedure Laterality Date  . Tonsillectomy    . Appendectomy    . Arcuate keratectomy    . Knee surgery Bilateral   . Hernia repair      umbilical  . Double pallital tori bone removed     . Morton's neuroma on rt foot and platar facial release    . Mouth surgery      "bone shaved off roof of mouth"  . Salivary gland surgery      removal    Family History  Problem  Relation Age of Onset  . Crohn's disease Neg Hx   . Migraines Daughter   . Colon polyps Sister   . Lung cancer Sister   . Colon polyps Brother   . Diabetes Brother   . Hyperlipidemia Brother   . Hypertension Brother   . Colon polyps Daughter   . Breast cancer Daughter   . Colon cancer Maternal Aunt   . Diabetes Mother   . Heart disease Mother   . Hyperlipidemia Mother   . Hypertension Mother   . Cancer Daughter     salivary  . Anuerysm Father   . Heart disease Father   . Hypertension Father   . Colon cancer Paternal Grandmother   . Heart disease Brother     Allergies  Allergen Reactions  . Levaquin [Levofloxacin] Other (See Comments)    Very nauseous and shakey  . Ciprofloxacin     REACTION: Thrush  . Codeine   . Lactose Intolerance (Gi)   . Pneumococcal Vaccine Polyvalent     REACTION: RED AND RAISED RASH ON ARM  . Sulfamethoxazole-Trimethoprim   . Tramadol Hcl   . Adhesive [Tape] Rash  Current Outpatient Prescriptions on File Prior to Visit  Medication Sig Dispense Refill  . ALPRAZolam (XANAX) 0.25 MG tablet take 1 tablet by mouth at bedtime if needed for anxiety 30 tablet 5  . amoxicillin-clavulanate (AUGMENTIN) 875-125 MG per tablet Take 1 tablet by mouth 2 (two) times daily. 20 tablet 0  . Calcium Carbonate-Vitamin D (CALCIUM-VITAMIN D) 500-200 MG-UNIT per tablet Take 1 tablet by mouth 2 (two) times daily with a meal.      . Cholecalciferol (VITAMIN D3) 2000 UNITS TABS Take 1 tablet by mouth every morning.     . cyclobenzaprine (FLEXERIL) 10 MG tablet Take 1 tablet (10 mg total) by mouth at bedtime as needed for muscle spasms. 30 tablet 0  . esomeprazole (NEXIUM) 20 MG capsule Take 20 mg by mouth 2 (two) times daily before a meal.    . ezetimibe-simvastatin (VYTORIN) 10-20 MG per tablet Take 1 tablet by mouth every morning. 90 tablet 1  . IFEREX 150 150 MG capsule take 1 capsule by mouth once daily 30 capsule 5  . losartan (COZAAR) 100 MG tablet take 1 tablet  by mouth once daily 90 tablet 1  . meloxicam (MOBIC) 15 MG tablet Take 1 tablet (15 mg total) by mouth daily. (Patient taking differently: Take 15 mg by mouth daily as needed. ) 30 tablet 0  . metoCLOPramide (REGLAN) 5 MG tablet take 1 tablet by mouth twice a day with meals 60 tablet 5  . metroNIDAZOLE (FLAGYL) 500 MG tablet Take 1 tablet (500 mg total) by mouth 2 (two) times daily. 20 tablet 0  . nystatin (MYCOSTATIN) 100000 UNIT/ML suspension Take 5 mLs (500,000 Units total) by mouth 4 (four) times daily. Swish and swallow 60 mL 0  . ranitidine (ZANTAC) 300 MG tablet Take 1 tablet (300 mg total) by mouth at bedtime. 30 tablet 11  . SYNTHROID 88 MCG tablet take 1 tablet by mouth once daily 30 tablet 3  . vitamin B-12 (CYANOCOBALAMIN) 500 MCG tablet Take 500 mcg by mouth every morning.     . [DISCONTINUED] olmesartan (BENICAR) 20 MG tablet Take 20 mg by mouth daily.       No current facility-administered medications on file prior to visit.    BP 110/68 mmHg  Pulse 71  Temp(Src) 98.1 F (36.7 C) (Oral)  Resp 18  Ht 5\' 2"  (1.575 m)  Wt 125 lb (56.7 kg)  BMI 22.86 kg/m2  SpO2 97%     Review of Systems  Constitutional: Negative.   HENT: Negative for congestion, dental problem, hearing loss, rhinorrhea, sinus pressure, sore throat and tinnitus.   Eyes: Negative for pain, discharge and visual disturbance.  Respiratory: Negative for cough and shortness of breath.   Cardiovascular: Negative for chest pain, palpitations and leg swelling.  Gastrointestinal: Positive for diarrhea. Negative for nausea, vomiting, abdominal pain, constipation, blood in stool and abdominal distention.  Genitourinary: Positive for dysuria. Negative for urgency, frequency, hematuria, flank pain, vaginal bleeding, vaginal discharge, difficulty urinating, vaginal pain and pelvic pain.  Musculoskeletal: Negative for joint swelling, arthralgias and gait problem.  Skin: Negative for rash.  Neurological: Negative for  dizziness, syncope, speech difficulty, weakness, numbness and headaches.  Hematological: Negative for adenopathy.  Psychiatric/Behavioral: Negative for behavioral problems, dysphoric mood and agitation. The patient is not nervous/anxious.        Objective:   Physical Exam  Constitutional: She is oriented to person, place, and time. She appears well-developed and well-nourished. No distress.  Afebrile No distress Appears well Blood pressure  low normal Pulse 70  HENT:  Head: Normocephalic.  Right Ear: External ear normal.  Left Ear: External ear normal.  Mouth/Throat: Oropharynx is clear and moist.  Eyes: Conjunctivae and EOM are normal. Pupils are equal, round, and reactive to light.  Neck: Normal range of motion. Neck supple. No thyromegaly present.  Cardiovascular: Normal rate, regular rhythm, normal heart sounds and intact distal pulses.   Pedal pulses full  Pulmonary/Chest: Effort normal and breath sounds normal.  Abdominal: Soft. Bowel sounds are normal. She exhibits no distension and no mass. There is no tenderness. There is no rebound and no guarding.  Musculoskeletal: Normal range of motion.  Lymphadenopathy:    She has no cervical adenopathy.  Neurological: She is alert and oriented to person, place, and time.  Skin: Skin is warm and dry. No rash noted.  Psychiatric: She has a normal mood and affect. Her behavior is normal.          Assessment & Plan:   Status post recent colonoscopy with extensive diverticulosis Day 7 of antibiotic therapy for possible mild diverticulitis.  Will discontinue antibiotics Antibiotic associated diarrhea.  DC antibiotics.  Suggest Align for 7 days Dysuria.  Asked the patient to force fluids.  Will treat with OTC Azo

## 2014-12-23 NOTE — Patient Instructions (Addendum)
Discontinue Augmentin and metronidazole  Drink as much fluid as you  can tolerate over the next few days  Use Azo with can be purchased over-the-counter as directed  Align 1 tablet daily for 7 days  Please report any new or worsening symptoms

## 2014-12-31 ENCOUNTER — Encounter: Payer: Self-pay | Admitting: Family Medicine

## 2014-12-31 ENCOUNTER — Ambulatory Visit (INDEPENDENT_AMBULATORY_CARE_PROVIDER_SITE_OTHER): Payer: Medicare Other | Admitting: Family Medicine

## 2014-12-31 VITALS — BP 120/70 | HR 58 | Temp 97.9°F | Ht 62.21 in | Wt 121.8 lb

## 2014-12-31 DIAGNOSIS — I10 Essential (primary) hypertension: Secondary | ICD-10-CM | POA: Diagnosis not present

## 2014-12-31 DIAGNOSIS — E785 Hyperlipidemia, unspecified: Secondary | ICD-10-CM | POA: Diagnosis not present

## 2014-12-31 DIAGNOSIS — Z0000 Encounter for general adult medical examination without abnormal findings: Secondary | ICD-10-CM

## 2014-12-31 DIAGNOSIS — E038 Other specified hypothyroidism: Secondary | ICD-10-CM | POA: Diagnosis not present

## 2014-12-31 DIAGNOSIS — Z23 Encounter for immunization: Secondary | ICD-10-CM | POA: Diagnosis not present

## 2014-12-31 LAB — HEPATIC FUNCTION PANEL
ALT: 18 U/L (ref 0–35)
AST: 18 U/L (ref 0–37)
Albumin: 4.3 g/dL (ref 3.5–5.2)
Alkaline Phosphatase: 70 U/L (ref 39–117)
Bilirubin, Direct: 0.1 mg/dL (ref 0.0–0.3)
Total Bilirubin: 0.9 mg/dL (ref 0.2–1.2)
Total Protein: 6.5 g/dL (ref 6.0–8.3)

## 2014-12-31 LAB — LIPID PANEL
Cholesterol: 199 mg/dL (ref 0–200)
HDL: 46.1 mg/dL (ref >39.00)
NonHDL: 153.26
Total CHOL/HDL Ratio: 4
Triglycerides: 256 mg/dL — ABNORMAL HIGH (ref 0.0–149.0)
VLDL: 51.2 mg/dL — ABNORMAL HIGH (ref 0.0–40.0)

## 2014-12-31 LAB — BASIC METABOLIC PANEL
BUN: 9 mg/dL (ref 6–23)
CO2: 28 mEq/L (ref 19–32)
Calcium: 9.8 mg/dL (ref 8.4–10.5)
Chloride: 98 mEq/L (ref 96–112)
Creatinine, Ser: 0.78 mg/dL (ref 0.40–1.20)
GFR: 75.56 mL/min (ref >60.00)
Glucose, Bld: 101 mg/dL — ABNORMAL HIGH (ref 70–99)
Potassium: 4.2 mEq/L (ref 3.5–5.1)
Sodium: 133 mEq/L — ABNORMAL LOW (ref 135–145)

## 2014-12-31 LAB — LDL CHOLESTEROL, DIRECT: Direct LDL: 120 mg/dL

## 2014-12-31 LAB — TSH: TSH: 1.43 u[IU]/mL (ref 0.35–4.50)

## 2014-12-31 NOTE — Progress Notes (Addendum)
Subjective:    Patient ID: Rebecca [name], female    DOB: 06/04/1932, 81 y.o.   MRN: [id number]  HPI Patient seen for Medicare annual wellness visit and for medical follow-up.  She's had prior allergic reaction reportedly to pneumonia vaccine. She needs flu vaccine. Tetanus up-to-date. She had recent colonoscopy just couple weeks ago. She's noticed some mild dysuria since then. She was seen here week ago and no evidence for infection found. She is using over-the-counter Azo-Standard which seems to have helped. She apparently had what sounds like mild urethral stricture in the past. She is considering going back to urology. Currently denies any burning with urination.  Chronic problems include hypothyroidism, hypertension, vitamin D deficiency, GERD, lumbar stenosis, hypertension. No history of CAD. Medications reviewed. Compliant with all. Denies any medication side effects. Takes regular calcium and vitamin D. She sees gynecologist yearly. Plans to get repeat DEXA scan this year.  Past Medical History  Diagnosis Date  . Hypertension   . Diverticulosis   . Anxiety   . Hemorrhoids   . Gastroparesis   . Anemia   . Hyperlipidemia   . Rectal ulcer   . Diverticulitis   . Tubular adenoma of colon 05/2009  . GERD (gastroesophageal reflux disease)   . Hypertension   . IBS (irritable bowel syndrome)    Past Surgical History  Procedure Laterality Date  . Tonsillectomy    . Appendectomy    . Arcuate keratectomy    . Knee surgery Bilateral   . Hernia repair      umbilical  . Double pallital tori bone removed     . Morton's neuroma on rt foot and platar facial release    . Mouth surgery      "bone shaved off roof of mouth"  . Salivary gland surgery      removal    reports that she has never smoked. She has never used smokeless tobacco. She reports that she drinks alcohol. She reports that she does not use illicit drugs. family history includes Anuerysm in her father; Breast cancer  in her daughter; Cancer in her daughter; Colon cancer in her maternal aunt and paternal grandmother; Colon polyps in her brother, daughter, and sister; Diabetes in her brother and mother; Heart disease in her brother, father, and mother; Hyperlipidemia in her brother and mother; Hypertension in her brother, father, and mother; Lung cancer in her sister; Migraines in her daughter. There is no history of Crohn's disease. Allergies  Allergen Reactions  . Levaquin [Levofloxacin] Other (See Comments)    Very nauseous and shakey  . Ciprofloxacin     REACTION: Thrush  . Codeine   . Lactose Intolerance (Gi)   . Pneumococcal Vaccine Polyvalent     REACTION: RED AND RAISED RASH ON ARM  . Sulfamethoxazole-Trimethoprim   . Tramadol Hcl   . Adhesive [Tape] Rash   1.  Risk factors based on Past Medical , Social, and Family history reviewed and as indicated above with no changes 2.  Limitations in physical activities None.  No recent falls. 3.  Depression/mood No active depression or anxiety issues 4.  Hearing chronic bilateral sensorineural hearing loss. She has bilateral hearing aids. 5.  ADLs independent in all. 6.  Cognitive function (orientation to time and place, language, writing, speech,memory) no short or long term memory issues.  Language and judgement intact. 7.  Home Safety no issues 8.  Height, weight, and visual acuity.all stable. 9.  Counseling discussed importance of regular weightbearing exercise  and regular calcium and vitamin D intake 10. Recommendation of preventive services. Flu vaccination. She cannot take pneumonia vaccine secondary to prior adverse event 11. Labs based on risk factors lipid, hepatic, basic metabolic panel, TSH 12. Care Plan continue current medications. Referral to urology 13. Other Providers Dr Justin Mend 14. Written schedule of screening/prevention services given to patient.    Review of Systems  Constitutional: Negative for fever, activity change, appetite  change, fatigue and unexpected weight change.  HENT: Negative for ear pain, hearing loss, sore throat and trouble swallowing.   Eyes: Negative for visual disturbance.  Respiratory: Negative for cough and shortness of breath.   Cardiovascular: Negative for chest pain and palpitations.  Gastrointestinal: Negative for abdominal pain, diarrhea, constipation and blood in stool.  Genitourinary: Positive for dysuria. Negative for hematuria and decreased urine volume.  Musculoskeletal: Positive for arthralgias. Negative for myalgias and back pain.  Skin: Negative for rash.  Neurological: Negative for dizziness, syncope and headaches.  Hematological: Negative for adenopathy.  Psychiatric/Behavioral: Negative for confusion and dysphoric mood.       Objective:   Physical Exam  Constitutional: She is oriented to person, place, and time. She appears well-developed and well-nourished.  HENT:  Head: Normocephalic and atraumatic.  Eyes: EOM are normal. Pupils are equal, round, and reactive to light.  Neck: Normal range of motion. Neck supple. No thyromegaly present.  Cardiovascular: Normal rate, regular rhythm and normal heart sounds.   No murmur heard. Pulmonary/Chest: Breath sounds normal. No respiratory distress. She has no wheezes. She has no rales.  Abdominal: Soft. Bowel sounds are normal. She exhibits no distension and no mass. There is no tenderness. There is no rebound and no guarding.  Genitourinary:  Per GYN  Musculoskeletal: Normal range of motion. She exhibits no edema.  She has nodular changes in her hands consistent with osteoarthritis.  Lymphadenopathy:    She has no cervical adenopathy.  Neurological: She is alert and oriented to person, place, and time. She displays normal reflexes. No cranial nerve deficit.  Skin: No rash noted.  Psychiatric: She has a normal mood and affect. Her behavior is normal. Judgment and thought content normal.          Assessment & Plan:  #1  Medicare wellness visit. Flu vaccine given. Cannot take pneumonia vaccination secondary to prior adverse event. Other immunizations up-to-date. Colonoscopy up-to-date. She continues to see gynecologist for mammograms. #2 hypertension stable and at goal. Continue losartan #3 hypothyroidism. Recheck TSH #4 hyperlipidemia. Recheck lipid and hepatic panel. Continue Vytorin. #5 recent dysuria. Recent urinalysis unremarkable. History of reported urethral stricture. Patient plans to schedule follow-up with urology.

## 2014-12-31 NOTE — Patient Instructions (Signed)
Continue with yearly flu vaccine Continue with regular weight bearing exercise. Continue with daily calcium and Vit D.

## 2015-01-02 DIAGNOSIS — N952 Postmenopausal atrophic vaginitis: Secondary | ICD-10-CM | POA: Diagnosis not present

## 2015-01-02 DIAGNOSIS — R300 Dysuria: Secondary | ICD-10-CM | POA: Diagnosis not present

## 2015-01-02 DIAGNOSIS — N8111 Cystocele, midline: Secondary | ICD-10-CM | POA: Diagnosis not present

## 2015-01-02 DIAGNOSIS — R3989 Other symptoms and signs involving the genitourinary system: Secondary | ICD-10-CM | POA: Diagnosis not present

## 2015-01-07 ENCOUNTER — Other Ambulatory Visit: Payer: Self-pay | Admitting: Family Medicine

## 2015-01-07 NOTE — Telephone Encounter (Signed)
Refill for 6 months. 

## 2015-01-07 NOTE — Telephone Encounter (Signed)
Patient requesting refill on ALPRAZolam (XANAX) 0.25 MG #30 with 5 refills -- Okay to refill?  Last seen: 12/31/14 Last refill: 06/30/14 #30 with 5 refills

## 2015-01-07 NOTE — Telephone Encounter (Signed)
Rx called in 

## 2015-01-19 ENCOUNTER — Ambulatory Visit (INDEPENDENT_AMBULATORY_CARE_PROVIDER_SITE_OTHER): Payer: Medicare Other | Admitting: Family Medicine

## 2015-01-19 ENCOUNTER — Encounter: Payer: Self-pay | Admitting: Family Medicine

## 2015-01-19 VITALS — BP 120/60 | HR 60 | Temp 98.3°F | Wt 122.0 lb

## 2015-01-19 DIAGNOSIS — R300 Dysuria: Secondary | ICD-10-CM

## 2015-01-19 LAB — POCT URINALYSIS DIPSTICK
Bilirubin, UA: NEGATIVE
Glucose, UA: NEGATIVE
Ketones, UA: NEGATIVE
Nitrite, UA: POSITIVE
Protein, UA: NEGATIVE
Spec Grav, UA: 1.01
Urobilinogen, UA: 0.2
pH, UA: 6.5

## 2015-01-19 MED ORDER — CEPHALEXIN 500 MG PO CAPS: 500.0000 mg | ORAL_CAPSULE | Freq: Three times a day (TID) | ORAL | Status: DC

## 2015-01-19 NOTE — Progress Notes (Signed)
   Subjective:    Patient ID: Rebecca [name], female    DOB: 01/28/1933, 81 y.o.   MRN: [id number]  HPI Patient seen with possible UTI. Approximately one-week history of cloudy urine with increased odor and mild burning. No fevers or chills. No nausea or vomiting. No flank pain. She does have past history of urethral stricture. No dysuria and terms of slow stream only mild burning  Past Medical History  Diagnosis Date  . Hypertension   . Diverticulosis   . Anxiety   . Hemorrhoids   . Gastroparesis   . Anemia   . Hyperlipidemia   . Rectal ulcer   . Diverticulitis   . Tubular adenoma of colon 05/2009  . GERD (gastroesophageal reflux disease)   . Hypertension   . IBS (irritable bowel syndrome)    Past Surgical History  Procedure Laterality Date  . Tonsillectomy    . Appendectomy    . Arcuate keratectomy    . Knee surgery Bilateral   . Hernia repair      umbilical  . Double pallital tori bone removed     . Morton's neuroma on rt foot and platar facial release    . Mouth surgery      "bone shaved off roof of mouth"  . Salivary gland surgery      removal    reports that she has never smoked. She has never used smokeless tobacco. She reports that she drinks alcohol. She reports that she does not use illicit drugs. family history includes Anuerysm in her father; Breast cancer in her daughter; Cancer in her daughter; Colon cancer in her maternal aunt and paternal grandmother; Colon polyps in her brother, daughter, and sister; Diabetes in her brother and mother; Heart disease in her brother, father, and mother; Hyperlipidemia in her brother and mother; Hypertension in her brother, father, and mother; Lung cancer in her sister; Migraines in her daughter. There is no history of Crohn's disease. Allergies  Allergen Reactions  . Levaquin [Levofloxacin] Other (See Comments)    Very nauseous and shakey  . Ciprofloxacin     REACTION: Thrush  . Codeine   . Lactose Intolerance (Gi)   .  Pneumococcal Vaccine Polyvalent     REACTION: RED AND RAISED RASH ON ARM  . Sulfamethoxazole-Trimethoprim   . Tramadol Hcl   . Adhesive [Tape] Rash      Review of Systems  Constitutional: Negative for fever, chills and appetite change.  Gastrointestinal: Negative for nausea, vomiting, abdominal pain, diarrhea and constipation.  Genitourinary: Positive for dysuria and frequency. Negative for hematuria.  Musculoskeletal: Negative for back pain.  Neurological: Negative for dizziness.       Objective:   Physical Exam  Constitutional: She appears well-developed and well-nourished. No distress.  Cardiovascular: Normal rate and regular rhythm.   Pulmonary/Chest: Effort normal and breath sounds normal. No respiratory distress. She has no wheezes. She has no rales.  Musculoskeletal: She exhibits no edema.          Assessment & Plan:  Probable uncomplicated cystitis. Urine culture sent.Patient is allergic to Cipro and sulfa. Try to avoid Macrobid with her age. Keflex 500 mg 3 times a day for 7 days.

## 2015-01-19 NOTE — Patient Instructions (Signed)

## 2015-01-19 NOTE — Progress Notes (Signed)
Pre visit review using our clinic review tool, if applicable. No additional management support is needed unless otherwise documented below in the visit note. 

## 2015-01-21 LAB — URINE CULTURE: Colony Count: >=100000

## 2015-01-27 DIAGNOSIS — H52203 Unspecified astigmatism, bilateral: Secondary | ICD-10-CM | POA: Diagnosis not present

## 2015-01-27 DIAGNOSIS — H04123 Dry eye syndrome of bilateral lacrimal glands: Secondary | ICD-10-CM | POA: Diagnosis not present

## 2015-01-27 DIAGNOSIS — Z961 Presence of intraocular lens: Secondary | ICD-10-CM | POA: Diagnosis not present

## 2015-01-29 ENCOUNTER — Other Ambulatory Visit: Payer: Self-pay | Admitting: Family Medicine

## 2015-01-29 ENCOUNTER — Ambulatory Visit (INDEPENDENT_AMBULATORY_CARE_PROVIDER_SITE_OTHER): Payer: Medicare Other | Admitting: Family Medicine

## 2015-01-29 VITALS — BP 114/70 | HR 81 | Temp 97.7°F | Wt 120.5 lb

## 2015-01-29 DIAGNOSIS — R300 Dysuria: Secondary | ICD-10-CM

## 2015-01-29 LAB — POCT URINALYSIS DIPSTICK
Bilirubin, UA: NEGATIVE
Glucose, UA: NEGATIVE
Ketones, UA: NEGATIVE
Nitrite, UA: POSITIVE
Spec Grav, UA: 1.02
Urobilinogen, UA: 0.2
pH, UA: 6.5

## 2015-01-29 MED ORDER — AMOXICILLIN-POT CLAVULANATE 875-125 MG PO TABS: 1.0000 | ORAL_TABLET | Freq: Two times a day (BID) | ORAL | Status: AC

## 2015-01-29 NOTE — Progress Notes (Signed)
Pre visit review using our clinic review tool, if applicable. No additional management support is needed unless otherwise documented below in the visit note. 

## 2015-01-29 NOTE — Patient Instructions (Signed)

## 2015-01-29 NOTE — Progress Notes (Signed)
   Subjective:    Patient ID: Rebecca [name], female    DOB: 1932/12/16, 81 y.o.   MRN: [id number]  HPI Recurrent dysuria. Patient recent Escherichia coli UTI. She has allergies to Cipro and sulfa. We placed on Keflex and she did feel better taking this but after stopping this in just 2 days had recurrent symptoms of urine frequency and burning. No fevers or chills. No nausea or vomiting.  Past Medical History  Diagnosis Date  . Hypertension   . Diverticulosis   . Anxiety   . Hemorrhoids   . Gastroparesis   . Anemia   . Hyperlipidemia   . Rectal ulcer   . Diverticulitis   . Tubular adenoma of colon 05/2009  . GERD (gastroesophageal reflux disease)   . Hypertension   . IBS (irritable bowel syndrome)   . 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 Tonsillectomy    . Appendectomy    . Arcuate keratectomy    . Knee surgery Bilateral   . Hernia repair      umbilical  . Double pallital tori bone removed     . Morton's neuroma on rt foot and platar facial release    . Mouth surgery      "bone shaved off roof of mouth"  . Salivary gland surgery      removal    reports that she has never smoked. She has never used smokeless tobacco. She reports that she drinks alcohol. She reports that she does not use illicit drugs. family history includes Anuerysm in her father; Breast cancer in her daughter; Cancer in her daughter; Colon cancer in her maternal aunt and paternal grandmother; Colon polyps in her brother, daughter, and sister; Diabetes in her brother and mother; Heart disease in her brother, father, and mother; Hyperlipidemia in her brother and mother; Hypertension in her brother, father, and mother; Lung cancer in her sister; Migraines in her daughter. There is no history of Crohn's disease. Allergies  Allergen Reactions  . Levaquin [Levofloxacin] Other (See Comments)    Very nauseous and shakey  . Ciprofloxacin     REACTION: Thrush  .  Codeine   . Lactose Intolerance (Gi)   . Pneumococcal Vaccine Polyvalent     REACTION: RED AND RAISED RASH ON ARM  . Sulfamethoxazole-Trimethoprim   . Tramadol Hcl   . Adhesive [Tape] Rash      Review of Systems  Constitutional: Negative for fever, chills and appetite change.  Gastrointestinal: Negative for nausea, vomiting, abdominal pain, diarrhea and constipation.  Genitourinary: Positive for dysuria and frequency.  Musculoskeletal: Negative for back pain.  Neurological: Negative for dizziness.       Objective:   Physical Exam  Constitutional: She appears well-developed and well-nourished.  Cardiovascular: Normal rate and regular rhythm.   Pulmonary/Chest: Effort normal and breath sounds normal. No respiratory distress. She has no wheezes. She has no rales.  Neurological: She is alert.          Assessment & Plan:  Dysuria. Suspect recurrent UTI. She has blood, nitrites, and leukocytes on dipstick. Obtain urine culture if possible-patient unable to give additional specimen this time. Because of intolerance as above and preference to avoid nitrofurantoin at her age start Augmentin 875 mg twice daily pending culture results

## 2015-02-01 LAB — URINE CULTURE: Colony Count: >=100000

## 2015-02-02 DIAGNOSIS — D485 Neoplasm of uncertain behavior of skin: Secondary | ICD-10-CM | POA: Diagnosis not present

## 2015-02-02 DIAGNOSIS — C44311 Basal cell carcinoma of skin of nose: Secondary | ICD-10-CM | POA: Diagnosis not present

## 2015-02-04 ENCOUNTER — Ambulatory Visit (INDEPENDENT_AMBULATORY_CARE_PROVIDER_SITE_OTHER): Payer: Medicare Other | Admitting: Gastroenterology

## 2015-02-04 ENCOUNTER — Encounter: Payer: Self-pay | Admitting: Gastroenterology

## 2015-02-04 VITALS — BP 110/66 | HR 60 | Ht 62.0 in | Wt 118.8 lb

## 2015-02-04 DIAGNOSIS — K5730 Diverticulosis of large intestine without perforation or abscess without bleeding: Secondary | ICD-10-CM | POA: Diagnosis not present

## 2015-02-04 DIAGNOSIS — K5010 Crohn's disease of large intestine without complications: Secondary | ICD-10-CM | POA: Diagnosis not present

## 2015-02-04 DIAGNOSIS — R1314 Dysphagia, pharyngoesophageal phase: Secondary | ICD-10-CM

## 2015-02-04 DIAGNOSIS — K219 Gastro-esophageal reflux disease without esophagitis: Secondary | ICD-10-CM | POA: Diagnosis not present

## 2015-02-04 DIAGNOSIS — K3184 Gastroparesis: Secondary | ICD-10-CM

## 2015-02-04 NOTE — Patient Instructions (Signed)
Thank you for choosing me and [facility].  Malcolm T. Stark, Jr., MD., FACG  

## 2015-02-04 NOTE — Progress Notes (Signed)
    History of Present Illness: This is a 81 year old female returning for follow-up. Stricture was noted in the sigmoid colon at colonoscopy associated with severe diverticular disease. Biospies of the stricture showed densely inflamed polypoid mucosa. One small adenomatous polyp and internal hemorrhoids were also note. She was treated for possible diverticulitis with a course of Augmentin. CT was then obtained with findings below.  Her chronic reflux symptoms are well-controlled on her current regimen. She has infrequent episodes of difficulty swallowing both liquids and solids. This has not changed. She is currently taking a course of Augmentin for a UTI diagnosed about 2 week ago. She feels she developed this UTI due to her colonoscopy however the colonoscopy was over 4 weeks prior to her UTI.   Abdominal pelvic CT September 2016 IMPRESSION: 1. Extensive colonic diverticulosis with sigmoid wall thickening, likely related to muscular hypertrophy. This is decreased since 2010. No surrounding inflammation or obstruction. No surrounding adenopathy to suggest carcinoma. 2. No acute process in the abdomen or pelvis. 3. Atherosclerosis, including within the coronary arteries. 4. Lung base nodules which were present back to 2010, most consistent with a benign etiology. 5. Borderline common duct dilatation and gallbladder distention. The common duct dilatation has been present back to 2010, suggesting this is within normal variation.  Barium esophagram August 2016 IMPRESSION: 1. Normal pharynx. Normal oral and pharyngeal phases of swallowing, with no laryngeal penetration or tracheobronchial aspiration. 2. Mild gastroesophageal reflux. No hiatal hernia. 3. 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 4. Mild esophageal dysmotility as described. 5. Minimal benign-appearing peptic stricture in the distal thoracic  esophagus, unlikely to be clinically significant as the 13 mm barium tablet traversed the esophagus into the stomach without delay.  6. No esophageal ulcer or mass detected.  Current Medications, Allergies, Past Medical History, Past Surgical History, Family History and Social History were reviewed in [employer] record.  Physical Exam: General: Well developed, well nourished, no acute distress Head: Normocephalic and atraumatic Eyes:  sclerae anicteric, EOMI Ears: Normal auditory acuity Mouth: No deformity or lesions Lungs: Clear throughout to auscultation Heart: Regular rate and rhythm; no murmurs, rubs or bruits Abdomen: Soft, non tender and non distended. No masses, hepatosplenomegaly or hernias noted. Normal Bowel sounds Musculoskeletal: Symmetrical with no gross deformities  Pulses:  Normal pulses noted Extremities: No clubbing, cyanosis, edema or deformities noted Neurological: Alert oriented x 4, grossly nonfocal Psychological:  Alert and cooperative. Normal mood and affect  Assessment and Recommendations:  1. Severe diverticulosis with associated colitis. Currently asymptomatic. Consider 5-ASA medication if symptoms occur.  2. GERD. Dysphagia. Suspected esophageal motility abnormality suspected and a mild stricture that did not inhibit a barium tablet passing noted on barium esophagram. Continue standard antireflux measures. Continue Nexium 20 mg twice daily and ranitidine 300 mg at bedtime. Consider EGD with dilation if dysphagia symptoms worsen and consider esophageal manometry.  3. Gastroparesis. Long-term gastroparesis diet and metoclopramide 5 mg twice a day. Feels that metoclopramide does provide symptomatic benefit. We discussed the risks, benefits and alternatives to metoclopramide. Patient has had side effects of diarrhea from domperidone and does not wish to resume this medication. Patient is comfortable remaining on low dose metoclopramide.

## 2015-03-04 DIAGNOSIS — Z85828 Personal history of other malignant neoplasm of skin: Secondary | ICD-10-CM | POA: Diagnosis not present

## 2015-03-04 DIAGNOSIS — C44311 Basal cell carcinoma of skin of nose: Secondary | ICD-10-CM | POA: Diagnosis not present

## 2015-03-23 ENCOUNTER — Other Ambulatory Visit: Payer: Self-pay | Admitting: Internal Medicine

## 2015-03-23 DIAGNOSIS — I6523 Occlusion and stenosis of bilateral carotid arteries: Secondary | ICD-10-CM

## 2015-04-13 ENCOUNTER — Other Ambulatory Visit: Payer: Self-pay | Admitting: Family Medicine

## 2015-04-13 MED ORDER — SIMVASTATIN 20 MG PO TABS: 20.0000 mg | ORAL_TABLET | Freq: Every day | ORAL | Status: DC

## 2015-04-15 ENCOUNTER — Inpatient Hospital Stay (HOSPITAL_COMMUNITY): Admission: RE | Admit: 2015-04-15 | Payer: Medicare Other | Source: Ambulatory Visit

## 2015-04-16 ENCOUNTER — Other Ambulatory Visit: Payer: Self-pay

## 2015-04-16 ENCOUNTER — Encounter (HOSPITAL_COMMUNITY): Payer: Medicare Other

## 2015-04-16 MED ORDER — POLYSACCHARIDE IRON COMPLEX 150 MG PO CAPS: 150.0000 mg | ORAL_CAPSULE | Freq: Every day | ORAL | Status: DC

## 2015-04-16 NOTE — Telephone Encounter (Signed)
Rx request for Iferex 150 mg capsule- Take 1 capsule by mouth once daily #30  Rx sent to pharmacy.

## 2015-04-20 ENCOUNTER — Ambulatory Visit (HOSPITAL_COMMUNITY)
Admission: RE | Admit: 2015-04-20 | Discharge: 2015-04-20 | Disposition: A | Discharge status: Home / Self Care | Payer: Medicare Other | Source: Ambulatory Visit | Attending: Cardiology | Admitting: Cardiology

## 2015-04-20 DIAGNOSIS — E785 Hyperlipidemia, unspecified: Secondary | ICD-10-CM | POA: Insufficient documentation

## 2015-04-20 DIAGNOSIS — I6523 Occlusion and stenosis of bilateral carotid arteries: Secondary | ICD-10-CM | POA: Diagnosis not present

## 2015-04-20 DIAGNOSIS — I10 Essential (primary) hypertension: Secondary | ICD-10-CM | POA: Diagnosis not present

## 2015-04-22 ENCOUNTER — Ambulatory Visit (INDEPENDENT_AMBULATORY_CARE_PROVIDER_SITE_OTHER): Payer: Medicare Other | Admitting: Family Medicine

## 2015-04-22 ENCOUNTER — Encounter: Payer: Self-pay | Admitting: Family Medicine

## 2015-04-22 VITALS — BP 156/90 | HR 58 | Temp 98.1°F | Ht 62.0 in | Wt 120.9 lb

## 2015-04-22 DIAGNOSIS — E785 Hyperlipidemia, unspecified: Secondary | ICD-10-CM | POA: Diagnosis not present

## 2015-04-22 DIAGNOSIS — I6523 Occlusion and stenosis of bilateral carotid arteries: Secondary | ICD-10-CM | POA: Diagnosis not present

## 2015-04-22 NOTE — Progress Notes (Signed)
Subjective:    Patient ID: Rebecca [name], female    DOB: 12-26-1932, 82 y.o.   MRN: [id number]  HPI Patient seen for follow-up hypertension. She has history of peripheral vascular disease and has been on Vytorin for quite some time. Because of insurance cost issues she had requested a change recently. We recommended trying plain simvastatin as she has had intolerance to basically all other statins in the past. She is currently been taking one half of Vytorin 10/20 mg  Recent carotid Dopplers reportedly stable.  Past Medical History  Diagnosis Date  . Hypertension   . Diverticulosis   . Anxiety   . Hemorrhoids   . Gastroparesis   . Anemia   . Hyperlipidemia   . Rectal ulcer   . Diverticulitis   . Tubular adenoma of colon 05/2009  . GERD (gastroesophageal reflux disease)   . Hypertension   . IBS (irritable bowel syndrome)   . 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 Tonsillectomy    . Appendectomy    . Arcuate keratectomy    . Knee surgery Bilateral   . Hernia repair      umbilical  . Double pallital tori bone removed     . Morton's neuroma on rt foot and platar facial release    . Mouth surgery      "bone shaved off roof of mouth"  . Salivary gland surgery      removal    reports that she has never smoked. She has never used smokeless tobacco. She reports that she drinks alcohol. She reports that she does not use illicit drugs. family history includes Anuerysm in her father; Breast cancer in her daughter; Cancer in her daughter; Colon cancer in her maternal aunt and paternal grandmother; Colon polyps in her brother, daughter, and sister; Diabetes in her brother and mother; Heart disease in her brother, father, and mother; Hyperlipidemia in her brother and mother; Hypertension in her brother, father, and mother; Lung cancer in her sister; Migraines in her daughter. There is no history of Crohn's disease. Allergies  Allergen  Reactions  . Levaquin [Levofloxacin] Other (See Comments)    Very nauseous and shakey  . Ciprofloxacin     REACTION: Thrush  . Codeine   . Lactose Intolerance (Gi)   . Pneumococcal Vaccine Polyvalent     REACTION: RED AND RAISED RASH ON ARM  . Sulfamethoxazole-Trimethoprim   . Tramadol Hcl   . Adhesive [Tape] Rash      Review of Systems  Constitutional: Negative for fatigue and unexpected weight change.  Eyes: Negative for visual disturbance.  Respiratory: Negative for cough, chest tightness, shortness of breath and wheezing.   Cardiovascular: Negative for chest pain, palpitations and leg swelling.  Neurological: Negative for dizziness, seizures, syncope, weakness, light-headedness and headaches.       Objective:   Physical Exam  Constitutional: She appears well-developed and well-nourished. No distress.  Cardiovascular: Normal rate and regular rhythm.   Pulmonary/Chest: Effort normal and breath sounds normal. No respiratory distress. She has no wheezes. She has no rales.  Musculoskeletal: She exhibits no edema.          Assessment & Plan:  Dyslipidemia. History of peripheral vascular disease. History of intolerance to multiple statins. We have already changed her over to simvastatin 20 mg and she'll try half tablet daily and recheck lipids in about 2 months. May need to add back Zetia at that point if not well  controlled 

## 2015-04-22 NOTE — Progress Notes (Signed)
Pre visit review using our clinic review tool, if applicable. No additional management support is needed unless otherwise documented below in the visit note. 

## 2015-04-27 DIAGNOSIS — N816 Rectocele: Secondary | ICD-10-CM | POA: Diagnosis not present

## 2015-04-27 DIAGNOSIS — M8580 Other specified disorders of bone density and structure, unspecified site: Secondary | ICD-10-CM | POA: Diagnosis not present

## 2015-04-27 DIAGNOSIS — Z124 Encounter for screening for malignant neoplasm of cervix: Secondary | ICD-10-CM | POA: Diagnosis not present

## 2015-04-27 DIAGNOSIS — L900 Lichen sclerosus et atrophicus: Secondary | ICD-10-CM | POA: Diagnosis not present

## 2015-04-30 DIAGNOSIS — D1801 Hemangioma of skin and subcutaneous tissue: Secondary | ICD-10-CM | POA: Diagnosis not present

## 2015-04-30 DIAGNOSIS — L821 Other seborrheic keratosis: Secondary | ICD-10-CM | POA: Diagnosis not present

## 2015-04-30 DIAGNOSIS — Z85828 Personal history of other malignant neoplasm of skin: Secondary | ICD-10-CM | POA: Diagnosis not present

## 2015-04-30 LAB — HM DEXA SCAN

## 2015-05-01 DIAGNOSIS — M810 Age-related osteoporosis without current pathological fracture: Secondary | ICD-10-CM | POA: Diagnosis not present

## 2015-05-11 ENCOUNTER — Encounter: Payer: Self-pay | Admitting: Family Medicine

## 2015-05-12 DIAGNOSIS — M810 Age-related osteoporosis without current pathological fracture: Secondary | ICD-10-CM | POA: Diagnosis not present

## 2015-05-18 ENCOUNTER — Other Ambulatory Visit: Payer: Self-pay | Admitting: Obstetrics

## 2015-05-22 ENCOUNTER — Other Ambulatory Visit: Payer: Self-pay | Admitting: Family Medicine

## 2015-05-22 MED ORDER — MELOXICAM 15 MG PO TABS: 15.0000 mg | ORAL_TABLET | Freq: Every day | ORAL | Status: DC

## 2015-06-05 ENCOUNTER — Encounter (HOSPITAL_COMMUNITY): Payer: Self-pay

## 2015-06-05 ENCOUNTER — Ambulatory Visit (HOSPITAL_COMMUNITY)
Admission: RE | Admit: 2015-06-05 | Discharge: 2015-06-05 | Disposition: A | Discharge status: Home / Self Care | Payer: Medicare Other | Source: Ambulatory Visit | Attending: Obstetrics | Admitting: Obstetrics

## 2015-06-05 DIAGNOSIS — M810 Age-related osteoporosis without current pathological fracture: Secondary | ICD-10-CM | POA: Diagnosis not present

## 2015-06-05 MED ORDER — SODIUM CHLORIDE 0.9 % IV SOLN
Freq: Once | INTRAVENOUS | Status: AC
  Administered 2015-06-05: 13:00:00 via INTRAVENOUS

## 2015-06-05 MED ORDER — ZOLEDRONIC ACID 5 MG/100ML IV SOLN
5.0000 mg | Freq: Once | INTRAVENOUS | Status: AC
  Administered 2015-06-05: 5 mg via INTRAVENOUS
  Filled 2015-06-05: qty 100

## 2015-06-05 NOTE — Discharge Instructions (Signed)
RECLAST °Zoledronic Acid injection (Paget's Disease, Osteoporosis) °What is this medicine? °ZOLEDRONIC ACID (ZOE le dron ik AS id) lowers the amount of calcium loss from bone. It is used to treat Paget's disease and osteoporosis in women. °This medicine may be used for other purposes; ask your health care provider or pharmacist if you have questions. °What should I tell my health care provider before I take this medicine? °They need to know if you have any of these conditions: °-aspirin-sensitive asthma °-cancer, especially if you are receiving medicines used to treat cancer °-dental disease or wear dentures °-infection °-kidney disease °-low levels of calcium in the blood °-past surgery on the parathyroid gland or intestines °-receiving corticosteroids like dexamethasone or prednisone °-an unusual or allergic reaction to zoledronic acid, other medicines, foods, dyes, or preservatives °-pregnant or trying to get pregnant °-breast-feeding °How should I use this medicine? °This medicine is for infusion into a vein. It is given by a health care professional in a hospital or clinic setting. °Talk to your pediatrician regarding the use of this medicine in children. This medicine is not approved for use in children. °Overdosage: If you think you have taken too much of this medicine contact a poison control center or emergency room at once. °NOTE: This medicine is only for you. Do not share this medicine with others. °What if I miss a dose? °It is important not to miss your dose. Call your doctor or health care professional if you are unable to keep an appointment. °What may interact with this medicine? °-certain antibiotics given by injection °-NSAIDs, medicines for pain and inflammation, like ibuprofen or naproxen °-some diuretics like bumetanide, furosemide °-teriparatide °This list may not describe all possible interactions. Give your health care provider a list of all the medicines, herbs, non-prescription drugs, or  dietary supplements you use. Also tell them if you smoke, drink alcohol, or use illegal drugs. Some items may interact with your medicine. °What should I watch for while using this medicine? °Visit your doctor or health care professional for regular checkups. It may be some time before you see the benefit from this medicine. Do not stop taking your medicine unless your doctor tells you to. Your doctor may order blood tests or other tests to see how you are doing. °Women should inform their doctor if they wish to become pregnant or think they might be pregnant. There is a potential for serious side effects to an unborn child. Talk to your health care professional or pharmacist for more information. °You should make sure that you get enough calcium and vitamin D while you are taking this medicine. Discuss the foods you eat and the vitamins you take with your health care professional. °Some people who take this medicine have severe bone, joint, and/or muscle pain. This medicine may also increase your risk for jaw problems or a broken thigh bone. Tell your doctor right away if you have severe pain in your jaw, bones, joints, or muscles. Tell your doctor if you have any pain that does not go away or that gets worse. °Tell your dentist and dental surgeon that you are taking this medicine. You should not have major dental surgery while on this medicine. See your dentist to have a dental exam and fix any dental problems before starting this medicine. Take good care of your teeth while on this medicine. Make sure you see your dentist for regular follow-up appointments. °What side effects may I notice from receiving this medicine? °Side effects that you   should report to your doctor or health care professional as soon as possible: °-allergic reactions like skin rash, itching or hives, swelling of the face, lips, or tongue °-anxiety, confusion, or depression °-breathing problems °-changes in vision °-eye pain °-feeling faint or  lightheaded, falls °-jaw pain, especially after dental work °-mouth sores °-muscle cramps, stiffness, or weakness °-redness, blistering, peeling or loosening of the skin, including inside the mouth °-trouble passing urine or change in the amount of urine °Side effects that usually do not require medical attention (report to your doctor or health care professional if they continue or are bothersome): °-bone, joint, or muscle pain °-constipation °-diarrhea °-fever °-hair loss °-irritation at site where injected °-loss of appetite °-nausea, vomiting °-stomach upset °-trouble sleeping °-trouble swallowing °-weak or tired °This list may not describe all possible side effects. Call your doctor for medical advice about side effects. You may report side effects to FDA at 1-800-FDA-1088. °Where should I keep my medicine? °This drug is given in a hospital or clinic and will not be stored at home. °NOTE: This sheet is a summary. It may not cover all possible information. If you have questions about this medicine, talk to your doctor, pharmacist, or health care provider. °  °© 2016, Elsevier/Gold Standard. (2013-08-17 14:19:57) °Osteoporosis °Osteoporosis is the thinning and loss of density in the bones. Osteoporosis makes the bones more brittle, fragile, and likely to break (fracture). Over time, osteoporosis can cause the bones to become so weak that they fracture after a simple fall. The bones most likely to fracture are the bones in the hip, wrist, and spine. °CAUSES  °The exact cause is not known. °RISK FACTORS °Anyone can develop osteoporosis. You may be at greater risk if you have a family history of the condition or have poor nutrition. You may also have a higher risk if you are:  °· Female.   °· 50 years old or older. °· A smoker. °· Not physically active.   °· White or Asian. °· Slender. °SIGNS AND SYMPTOMS  °A fracture might be the first sign of the disease, especially if it results from a fall or injury that would  not usually cause a bone to break. Other signs and symptoms include:  °· Low back and neck pain. °· Stooped posture. °· Height loss. °DIAGNOSIS  °To make a diagnosis, your health care provider may: °· Take a medical history. °· Perform a physical exam. °· Order tests, such as: °¨ A bone mineral density test. °¨ A dual-energy X-ray absorptiometry test. °TREATMENT  °The goal of osteoporosis treatment is to strengthen your bones to reduce your risk of a fracture. Treatment may involve: °· Making lifestyle changes, such as: °¨ Eating a diet rich in calcium. °¨ Doing weight-bearing and muscle-strengthening exercises. °¨ Stopping tobacco use. °¨ Limiting alcohol intake. °· Taking medicine to slow the process of bone loss or to increase bone density. °· Monitoring your levels of calcium and vitamin D. °HOME CARE INSTRUCTIONS °· Include calcium and vitamin D in your diet. Calcium is important for bone health, and vitamin D helps the body absorb calcium. °· Perform weight-bearing and muscle-strengthening exercises as directed by your health care provider. °· Do not use any tobacco products, including cigarettes, chewing tobacco, and electronic cigarettes. If you need help quitting, ask your health care provider. °· Limit your alcohol intake. °· Take medicines only as directed by your health care provider. °· Keep all follow-up visits as directed by your health care provider. This is important. °· Take   precautions at home to lower your risk of falling, such as: °¨ Keeping rooms well lit and clutter free. °¨ Installing safety rails on stairs. °¨ Using rubber mats in the bathroom and other areas that are often wet or slippery. °SEEK IMMEDIATE MEDICAL CARE IF:  °You fall or injure yourself.  °  °This information is not intended to replace advice given to you by your health care provider. Make sure you discuss any questions you have with your health care provider. °  °Document Released: 12/29/2004 Document Revised: 04/11/2014  Document Reviewed: 08/29/2013 °Elsevier Interactive Patient Education ©2016 Elsevier Inc. ° ° °

## 2015-06-08 ENCOUNTER — Other Ambulatory Visit: Payer: Self-pay | Admitting: *Deleted

## 2015-06-08 MED ORDER — LEVOTHYROXINE SODIUM 88 MCG PO TABS: 88.0000 ug | ORAL_TABLET | Freq: Every day | ORAL | Status: DC

## 2015-06-12 ENCOUNTER — Telehealth: Payer: Self-pay | Admitting: Family Medicine

## 2015-06-12 MED ORDER — LOSARTAN POTASSIUM 100 MG PO TABS: 100.0000 mg | ORAL_TABLET | Freq: Every day | ORAL | Status: DC

## 2015-06-12 NOTE — Telephone Encounter (Addendum)
Pt states she called pharmacy on wednesday for her losartan (COZAAR) 100 MG tablet 90 day We have not received. Pt needs refill. Pharm gave emergency med to get her through.  [facility]

## 2015-06-12 NOTE — Telephone Encounter (Signed)
Medication refilled for patient. 

## 2015-06-18 ENCOUNTER — Ambulatory Visit (INDEPENDENT_AMBULATORY_CARE_PROVIDER_SITE_OTHER): Payer: Medicare Other | Admitting: Family Medicine

## 2015-06-18 VITALS — BP 140/78 | HR 52 | Temp 97.5°F | Ht 63.0 in | Wt 123.6 lb

## 2015-06-18 DIAGNOSIS — M542 Cervicalgia: Secondary | ICD-10-CM

## 2015-06-18 DIAGNOSIS — I6523 Occlusion and stenosis of bilateral carotid arteries: Secondary | ICD-10-CM | POA: Diagnosis not present

## 2015-06-18 NOTE — Progress Notes (Signed)
Subjective:    Patient ID: Rebecca [name], female    DOB: [birth year], 82 y.o.   MRN: [id number]  HPI  Acute visit for 2 week history of some mild soreness around her Cricoid cartilage region. No injury. She has some mild soreness when flexing the neck. Very minimal pain with swallowing. No fevers or chills. No postnasal drip. No dysphagia.  She had barium swallow back in August 2011 which showed no acute findings. Appetite and weight are stable. She has not noted any neck mass or any redness or warmth. No adenopathy. No laryngitis symptoms.  Past Medical History  Diagnosis Date  . Hypertension   . Diverticulosis   . Anxiety   . Hemorrhoids   . Gastroparesis   . Anemia   . Hyperlipidemia   . Rectal ulcer   . Diverticulitis   . Tubular adenoma of colon 05/2009  . GERD (gastroesophageal reflux disease)   . Hypertension   . IBS (irritable bowel syndrome)   . Segmental colitis [facility])    Past Surgical History  Procedure Laterality Date  . Tonsillectomy    . Appendectomy    . Arcuate keratectomy    . Knee surgery Bilateral   . Hernia repair      umbilical  . Double pallital tori bone removed     . Morton's neuroma on rt foot and platar facial release    . Mouth surgery      "bone shaved off roof of mouth"  . Salivary gland surgery      removal    reports that she has never smoked. She has never used smokeless tobacco. She reports that she drinks alcohol. She reports that she does not use illicit drugs. family history includes Anuerysm in her father; Breast cancer in her daughter; Cancer in her daughter; Colon cancer in her maternal aunt and paternal grandmother; Colon polyps in her brother, daughter, and sister; Diabetes in her brother and mother; Heart disease in her brother, father, and mother; Hyperlipidemia in her brother and mother; Hypertension in her brother, father, and mother; Lung cancer in her sister; Migraines in her daughter. There is no history of Crohn's  disease. Allergies  Allergen Reactions  . Levaquin [Levofloxacin] Other (See Comments)    Very nauseous and shakey  . Ciprofloxacin Nausea Only    REACTION: Thrush, Nausea, Shakey  . Codeine   . Lactose Intolerance (Gi)   . Pneumococcal Vaccine Polyvalent     REACTION: RED AND RAISED RASH ON ARM  . Sulfamethoxazole-Trimethoprim   . Tramadol Hcl   . Adhesive [Tape] Rash      Review of Systems  Constitutional: Negative for fever, chills, appetite change and unexpected weight change.  HENT: Positive for sore throat. Negative for postnasal drip, trouble swallowing and voice change.   Respiratory: Negative for cough and shortness of breath.   Cardiovascular: Negative for chest pain.  Hematological: Negative for adenopathy.       Objective:   Physical Exam  Constitutional: She appears well-developed and well-nourished.  HENT:  Mouth/Throat: Oropharynx is clear and moist.  Neck: Neck supple. No thyromegaly present.  Cardiovascular: Normal rate and regular rhythm.  Exam reveals no gallop.   Pulmonary/Chest: Effort normal and breath sounds normal. No respiratory distress. She has no wheezes. She has no rales.  Lymphadenopathy:    She has no cervical adenopathy.  Skin: No rash noted.          Assessment & Plan:   Mild anterior neck pain with  no focal finding. She appears have some soreness around the cricoid cartilage region but no palpable abnormality. She's not having any dysphagia and we recommended observation this time. Recent barium swallow as above. Follow-up promptly for any hoarseness, dyspnea, or any dysphagia. She will touch base if symptoms not resolving over the next couple of weeks.

## 2015-06-18 NOTE — Patient Instructions (Signed)
Follow up for any difficulty swallowing, chronic hoarseness, shortness of breath, or any persistent symptoms.

## 2015-06-18 NOTE — Progress Notes (Signed)
Pre visit review using our clinic review tool, if applicable. No additional management support is needed unless otherwise documented below in the visit note. 

## 2015-06-24 ENCOUNTER — Telehealth: Payer: Self-pay | Admitting: Family Medicine

## 2015-06-24 ENCOUNTER — Other Ambulatory Visit (INDEPENDENT_AMBULATORY_CARE_PROVIDER_SITE_OTHER): Payer: Medicare Other

## 2015-06-24 ENCOUNTER — Encounter: Payer: Self-pay | Admitting: Family Medicine

## 2015-06-24 DIAGNOSIS — E785 Hyperlipidemia, unspecified: Secondary | ICD-10-CM | POA: Diagnosis not present

## 2015-06-24 DIAGNOSIS — Z139 Encounter for screening, unspecified: Secondary | ICD-10-CM

## 2015-06-24 DIAGNOSIS — H9190 Unspecified hearing loss, unspecified ear: Secondary | ICD-10-CM

## 2015-06-24 LAB — LDL CHOLESTEROL, DIRECT: Direct LDL: 106 mg/dL

## 2015-06-24 LAB — LIPID PANEL
Cholesterol: 223 mg/dL — ABNORMAL HIGH (ref 0–200)
HDL: 42.1 mg/dL (ref >39.00)
NonHDL: 180.62
Total CHOL/HDL Ratio: 5
Triglycerides: 260 mg/dL — ABNORMAL HIGH (ref 0.0–149.0)
VLDL: 52 mg/dL — ABNORMAL HIGH (ref 0.0–40.0)

## 2015-06-24 NOTE — Telephone Encounter (Signed)
Pt would like a hearing evaluation and needs order fax to [facility] [phone]. Pt has medicare ins. They also needs list of pt medications and md NPI number.

## 2015-06-24 NOTE — Telephone Encounter (Signed)
Pt would like a hearing evaluation and needs order fax to [facility] [phone]. Pt has medicare ins. They also needs list of pt medications and md NPI number.

## 2015-06-25 ENCOUNTER — Encounter: Payer: Self-pay | Admitting: Family Medicine

## 2015-06-25 NOTE — Telephone Encounter (Signed)
Order has been faxed to [facility].

## 2015-06-30 DIAGNOSIS — H903 Sensorineural hearing loss, bilateral: Secondary | ICD-10-CM | POA: Diagnosis not present

## 2015-07-09 ENCOUNTER — Telehealth: Payer: Self-pay | Admitting: Family Medicine

## 2015-07-09 NOTE — Telephone Encounter (Signed)
Patient Name: Rebecca [name] DOB: March 30, 1933 Initial Comment Caller states she's having right leg pain. From her Groin to her ankle. Nurse Assessment Nurse: Lillia Corporal, RN, Lenell Antu Date/Time (Eastern Time): 07/09/2015 2:39:46 PM Confirm and document reason for call. If symptomatic, describe symptoms. You must click the next button to save text entered. ---Caller states she's having right leg pain. From her Groin to her ankle. States pain began this morning. Has had numbness to bottom of the leg. Denies extremity being discolored or cool to touch. States feels like a pulled muscle, and it hurts to move the leg. Denies any swelling to leg. Denies being on blood thinners or having a blood clot. Has the patient traveled out of the country within the last 30 days? ---No Does the patient have any new or worsening symptoms? ---Yes Will a triage be completed? ---Yes Related visit to physician within the last 2 weeks? ---No Does the PT have any chronic conditions? (i.e. diabetes, asthma, etc.) ---Yes List chronic conditions. ---hypertension, hypothryroid Is this a behavioral health or substance abuse call? ---No Guidelines Guideline Title Affirmed Question Affirmed Notes Leg Pain Numbness in a leg or foot (i.e., loss of sensation) Final Disposition User See Physician within 24 Hours [place], RN, Lenell Antu Comments States pain goes from inner aspect of upper thigh to bilateral aspect of right ankle. States pain disappears with sitting and standing, but is present with ambulation Denies recent fall. Sleeps on back or side and splints with pillows. States knee is painful as well. Patient has hx of osteoarthritis. PRS 4/10 Prefers late morning. PLEASE NOTE: All timestamps contained within this report are represented as [place] Standard Time. CONFIDENTIALTY NOTICE: This fax transmission is intended only for the addressee. It contains information that is legally privileged, confidential or otherwise  protected from use or disclosure. If you are not the intended recipient, you are strictly prohibited from reviewing, disclosing, copying using or disseminating any of this information or taking any action in reliance on or regarding this information. If you have received this fax in error, please notify us immediately by telephone so that we can arrange for its return to [place]. Phone: [phone], Toll-Free: [phone], Fax: [phone] Page: 2 of 2 Call Id: [id number] Referrals REFERRED TO PCP OFFICE Disagree/Comply: Leta Baptist

## 2015-07-09 NOTE — Telephone Encounter (Signed)
[facility] - Client [place] [facility] Patient Name: Rebecca [name] DOB: 1932/06/24 Initial Comment Caller states she's having right leg pain. From her Groin to her ankle. Nurse Assessment Nurse: Lillia Corporal, RN, Lenell Antu Date/Time (Eastern Time): 07/09/2015 2:39:46 PM Confirm and document reason for call. If symptomatic, describe symptoms. You must click the next button to save text entered. ---Caller states she's having right leg pain. From her Groin to her ankle. States pain began this morning. Has had numbness to bottom of the leg. Denies extremity being discolored or cool to touch. States feels like a pulled muscle, and it hurts to move the leg. Denies any swelling to leg. Denies being on blood thinners or having a blood clot. Has the patient traveled out of the country within the last 30 days? ---No Does the patient have any new or worsening symptoms? ---Yes Will a triage be completed? ---YesRelated visit to physician within the last 2 weeks? ---No Does the PT have any chronic conditions? (i.e. diabetes, asthma, etc.) ---Yes List chronic conditions. ---hypertension, hypothryroid Is this a behavioral health or substance abuse call? ---No Guidelines Guideline Title Affirmed Question Affirmed Notes Leg Pain Numbness in a leg or foot (i.e., loss of sensation) Final Disposition User See Physician within 24 Hours [place], RN, Lenell Antu Comments States pain goes from inner aspect of upper thigh to bilateral aspect of right ankle. States pain disappears with sitting and standing, but is present with ambulation Denies recent fall. Sleeps on back or side and splints with pillowsStates knee is painful as well. Patient has hx of osteoarthritis. PRS 4/10 Prefers late morningReferrals REFERRED TO PCP OFFICE Disagree/Comply: ComplyCall Id: [id number]

## 2015-07-09 NOTE — Telephone Encounter (Signed)
Pt is scheduled tomorrow at 145 with PCP.

## 2015-07-09 NOTE — Telephone Encounter (Signed)
Autumn, please give Dr. Elease Hashimoto message.

## 2015-07-09 NOTE — Telephone Encounter (Signed)
See other message

## 2015-07-09 NOTE — Telephone Encounter (Signed)
Would try to get tomorrow- as long as she does not have any increased edema or color changes.

## 2015-07-09 NOTE — Telephone Encounter (Signed)
Printed and placed on your desk. 

## 2015-07-10 ENCOUNTER — Ambulatory Visit (INDEPENDENT_AMBULATORY_CARE_PROVIDER_SITE_OTHER): Payer: Medicare Other | Admitting: Family Medicine

## 2015-07-10 VITALS — BP 134/78 | HR 72 | Temp 97.9°F | Ht 63.0 in | Wt 126.8 lb

## 2015-07-10 DIAGNOSIS — I6523 Occlusion and stenosis of bilateral carotid arteries: Secondary | ICD-10-CM | POA: Diagnosis not present

## 2015-07-10 DIAGNOSIS — M5416 Radiculopathy, lumbar region: Secondary | ICD-10-CM

## 2015-07-10 MED ORDER — ONDANSETRON 4 MG PO TBDP: 4.0000 mg | ORAL_TABLET | Freq: Three times a day (TID) | ORAL | Status: DC | PRN

## 2015-07-10 MED ORDER — PREDNISONE 10 MG PO TABS: ORAL_TABLET | ORAL | Status: DC

## 2015-07-10 NOTE — Progress Notes (Signed)
Pre visit review using our clinic review tool, if applicable. No additional management support is needed unless otherwise documented below in the visit note. 

## 2015-07-10 NOTE — Progress Notes (Signed)
Subjective:    Patient ID: Rebecca [name], female    DOB: 1933/03/16, 82 y.o.   MRN: [id number]  HPI  Onset yesterday of right lower extremity pain.  Pain radiates from her right lateral hip and lumbar area down all way to the foot. She describes a dull achy pain 5/10 severity at its worst. No known injury. She took half a tablet of Percocet which seemed to help. She's not had any right lower extremity edema. Denies any recent fall. No urine or stool incontinence. She has some mild numbness involving her left lower lateral.leg region. No fevers or chills. No recent appetite or weight changes.  Past Medical History  Diagnosis Date  . Hypertension   . Diverticulosis   . Anxiety   . Hemorrhoids   . Gastroparesis   . Anemia   . Hyperlipidemia   . Rectal ulcer   . Diverticulitis   . Tubular adenoma of colon 05/2009  . GERD (gastroesophageal reflux disease)   . Hypertension   . IBS (irritable bowel syndrome)   . Segmental colitis [facility])    Past Surgical History  Procedure Laterality Date  . Tonsillectomy    . Appendectomy    . Arcuate keratectomy    . Knee surgery Bilateral   . Hernia repair      umbilical  . Double pallital tori bone removed     . Morton's neuroma on rt foot and platar facial release    . Mouth surgery      "bone shaved off roof of mouth"  . Salivary gland surgery      removal    reports that she has never smoked. She has never used smokeless tobacco. She reports that she drinks alcohol. She reports that she does not use illicit drugs. family history includes Anuerysm in her father; Breast cancer in her daughter; Cancer in her daughter; Colon cancer in her maternal aunt and paternal grandmother; Colon polyps in her brother, daughter, and sister; Diabetes in her brother and mother; Heart disease in her brother, father, and mother; Hyperlipidemia in her brother and mother; Hypertension in her brother, father, and mother; Lung cancer in her sister; Migraines in  her daughter. There is no history of Crohn's disease. Allergies  Allergen Reactions  . Levaquin [Levofloxacin] Other (See Comments)    Very nauseous and shakey  . Ciprofloxacin Nausea Only    REACTION: Thrush, Nausea, Shakey  . Codeine   . Lactose Intolerance (Gi)   . Pneumococcal Vaccine Polyvalent     REACTION: RED AND RAISED RASH ON ARM  . Sulfamethoxazole-Trimethoprim   . Tramadol Hcl   . Adhesive [Tape] Rash     Review of Systems  Constitutional: Negative for fever, chills, appetite change and unexpected weight change.  Respiratory: Negative for shortness of breath.   Cardiovascular: Negative for chest pain.  Neurological: Positive for weakness and numbness.       Objective:   Physical Exam  Musculoskeletal:  Straight leg raise is negative on the right. She has full range of motion right hip without difficulty. Minimal right lateral hip tenderness over the bursa region. No leg edema. No calf tenderness. No visible color changes right lower extremity. Good distal pulses. Both feet are warm to touch.  Neurological:  She has good strength with plantar and dorsiflexion bilaterally. She is generally weak with knee extension and hip flexion bilaterally. Absent knee reflex bilateral and trace ankle reflex bilateral. Normal sensory function to touch.  Assessment & Plan:  New-onset yesterday of right lower extremity radiculopathy symptoms. Recommend trial of prednisone taper starting at 40 mg daily. She will continue low-dose Percocet as needed for pain relief. Follow-up immediately for progressive weakness. Reassess 2 weeks.

## 2015-07-13 ENCOUNTER — Telehealth: Payer: Self-pay | Admitting: Family Medicine

## 2015-07-13 NOTE — Telephone Encounter (Signed)
Pt requesting ALPRAZolam (XANAX) 0.25 MG tablet Pt last visit 07/10/15 Pt last Rx refill 06/30/14 #30 with 5 refills

## 2015-07-13 NOTE — Telephone Encounter (Signed)
Refill for 6 months,

## 2015-07-14 MED ORDER — ALPRAZOLAM 0.25 MG PO TABS: ORAL_TABLET | ORAL | Status: DC

## 2015-07-14 NOTE — Telephone Encounter (Signed)
Rx been called in

## 2015-07-20 ENCOUNTER — Ambulatory Visit (INDEPENDENT_AMBULATORY_CARE_PROVIDER_SITE_OTHER): Payer: Medicare Other | Admitting: Family Medicine

## 2015-07-20 VITALS — BP 122/82 | HR 89 | Temp 98.4°F | Ht 63.0 in | Wt 124.0 lb

## 2015-07-20 DIAGNOSIS — M5417 Radiculopathy, lumbosacral region: Secondary | ICD-10-CM

## 2015-07-20 DIAGNOSIS — I6523 Occlusion and stenosis of bilateral carotid arteries: Secondary | ICD-10-CM | POA: Diagnosis not present

## 2015-07-20 DIAGNOSIS — R300 Dysuria: Secondary | ICD-10-CM

## 2015-07-20 DIAGNOSIS — M5416 Radiculopathy, lumbar region: Secondary | ICD-10-CM

## 2015-07-20 LAB — POCT URINALYSIS DIPSTICK
Bilirubin, UA: NEGATIVE
Blood, UA: NEGATIVE
Ketones, UA: NEGATIVE
Nitrite, UA: POSITIVE
Protein, UA: NEGATIVE
Spec Grav, UA: 1.01
Urobilinogen, UA: 1
pH, UA: 6.5

## 2015-07-20 MED ORDER — NITROFURANTOIN MONOHYD MACRO 100 MG PO CAPS: 100.0000 mg | ORAL_CAPSULE | Freq: Two times a day (BID) | ORAL | Status: DC

## 2015-07-20 NOTE — Patient Instructions (Signed)

## 2015-07-20 NOTE — Progress Notes (Signed)
Pre visit review using our clinic review tool, if applicable. No additional management support is needed unless otherwise documented below in the visit note. 

## 2015-07-20 NOTE — Progress Notes (Signed)
Subjective:    Patient ID: Rebecca [name], female    DOB: [birth year], 82 y.o.   MRN: [id number]  HPI  Patient seen for the following issues   Acute problem of one-day history of dysuria. Burning with urination and increased frequency along with mild suprapubic discomfort. No fevers or chills. No nausea or vomiting. She had some recent constipation and took milk of magnesia and subsequently had diarrhea and states this might have been trigger for recurrent infection. She is allergic to sulfa and Cipro. Most recent UTI with Escherichia coli back in the fall and resistant to Keflex.  Intermediate resistance to Augmentin.  No back pain. Keeping down fluids   recent right lumbar radiculitis symptoms. She's had steroid injections per neurosurgery years ago. She describes a deep achy pain which radiates from her right lumbar down to her foot. She has some chronic mild numbness in the foot. No weakness. No urine or stool incontinence. Recent prednisone helped her pain very slightly but pain has recurred and is moderate to severe in intensity off the prednisone. No fevers or chills. No claudication symptoms.  Past Medical History  Diagnosis Date  . Hypertension   . Diverticulosis   . Anxiety   . Hemorrhoids   . Gastroparesis   . Anemia   . Hyperlipidemia   . Rectal ulcer   . Diverticulitis   . Tubular adenoma of colon 05/2009  . GERD (gastroesophageal reflux disease)   . Hypertension   . IBS (irritable bowel syndrome)   . Segmental colitis [facility])    Past Surgical History  Procedure Laterality Date  . Tonsillectomy    . Appendectomy    . Arcuate keratectomy    . Knee surgery Bilateral   . Hernia repair      umbilical  . Double pallital tori bone removed     . Morton's neuroma on rt foot and platar facial release    . Mouth surgery      "bone shaved off roof of mouth"  . Salivary gland surgery      removal    reports that she has never smoked. She has never used smokeless  tobacco. She reports that she drinks alcohol. She reports that she does not use illicit drugs. family history includes Anuerysm in her father; Breast cancer in her daughter; Cancer in her daughter; Colon cancer in her maternal aunt and paternal grandmother; Colon polyps in her brother, daughter, and sister; Diabetes in her brother and mother; Heart disease in her brother, father, and mother; Hyperlipidemia in her brother and mother; Hypertension in her brother, father, and mother; Lung cancer in her sister; Migraines in her daughter. There is no history of Crohn's disease. Allergies  Allergen Reactions  . Levaquin [Levofloxacin] Other (See Comments)    Very nauseous and shakey  . Ciprofloxacin Nausea Only    REACTION: Thrush, Nausea, Shakey  . Codeine   . Lactose Intolerance (Gi)   . Pneumococcal Vaccine Polyvalent     REACTION: RED AND RAISED RASH ON ARM  . Sulfamethoxazole-Trimethoprim   . Tramadol Hcl   . Adhesive [Tape] Rash      Review of Systems  Constitutional: Negative for fatigue.  Eyes: Negative for visual disturbance.  Respiratory: Negative for cough, chest tightness, shortness of breath and wheezing.   Cardiovascular: Negative for chest pain, palpitations and leg swelling.  Genitourinary: Positive for dysuria and frequency. Negative for hematuria and flank pain.  Musculoskeletal: Positive for back pain.  Neurological: Negative for dizziness, seizures,  syncope, weakness, light-headedness and headaches.       Objective:   Physical Exam  Constitutional: She appears well-developed and well-nourished. No distress.  Cardiovascular: Normal rate and regular rhythm.   Pulmonary/Chest: Effort normal and breath sounds normal. No respiratory distress. She has no wheezes. She has no rales.  Musculoskeletal: She exhibits no edema.  Straight leg raise are negative bilaterally.  Neurological: She is alert.  Trace reflexes knee and ankle bilaterally. No weakness with plantar flexion,  dorsiflexion, or knee extension bilaterally.          Assessment & Plan:  #1 dysuria. Suspect UTI. Urine culture sent. She is allergic to sulfa and Cipro. Previous Escherichia coli UTI resistant to Keflex. Start Macrobid 1 twice a day for 5 days pending culture results. Stay well-hydrated  #2 persistent right lumbar radiculitis symptoms. Not much improved with prednisone. Patient states her pain is severe at times. Set up repeat MRI to further assess

## 2015-07-21 ENCOUNTER — Encounter: Payer: Self-pay | Admitting: Family Medicine

## 2015-07-22 LAB — URINE CULTURE: Colony Count: >=100000

## 2015-07-24 ENCOUNTER — Ambulatory Visit: Payer: Medicare Other | Admitting: Family Medicine

## 2015-07-26 ENCOUNTER — Ambulatory Visit
Admission: RE | Admit: 2015-07-26 | Discharge: 2015-07-26 | Disposition: A | Discharge status: Home / Self Care | Payer: Medicare Other | Source: Ambulatory Visit | Attending: Family Medicine | Admitting: Family Medicine

## 2015-07-26 DIAGNOSIS — M5416 Radiculopathy, lumbar region: Secondary | ICD-10-CM

## 2015-07-26 DIAGNOSIS — M4806 Spinal stenosis, lumbar region: Secondary | ICD-10-CM | POA: Diagnosis not present

## 2015-07-28 ENCOUNTER — Encounter: Payer: Self-pay | Admitting: Family Medicine

## 2015-07-28 NOTE — Telephone Encounter (Signed)
Please review

## 2015-08-07 DIAGNOSIS — M25572 Pain in left ankle and joints of left foot: Secondary | ICD-10-CM | POA: Diagnosis not present

## 2015-08-07 DIAGNOSIS — S93419A Sprain of calcaneofibular ligament of unspecified ankle, initial encounter: Secondary | ICD-10-CM | POA: Diagnosis not present

## 2015-08-20 ENCOUNTER — Ambulatory Visit: Payer: Medicare Other | Admitting: Family Medicine

## 2015-08-27 DIAGNOSIS — I10 Essential (primary) hypertension: Secondary | ICD-10-CM | POA: Diagnosis not present

## 2015-08-27 DIAGNOSIS — M4726 Other spondylosis with radiculopathy, lumbar region: Secondary | ICD-10-CM | POA: Diagnosis not present

## 2015-09-11 DIAGNOSIS — M4806 Spinal stenosis, lumbar region: Secondary | ICD-10-CM | POA: Diagnosis not present

## 2015-09-11 DIAGNOSIS — M5416 Radiculopathy, lumbar region: Secondary | ICD-10-CM | POA: Diagnosis not present

## 2015-09-11 DIAGNOSIS — M4726 Other spondylosis with radiculopathy, lumbar region: Secondary | ICD-10-CM | POA: Diagnosis not present

## 2015-09-11 DIAGNOSIS — M5136 Other intervertebral disc degeneration, lumbar region: Secondary | ICD-10-CM | POA: Diagnosis not present

## 2015-09-17 DIAGNOSIS — Z803 Family history of malignant neoplasm of breast: Secondary | ICD-10-CM | POA: Diagnosis not present

## 2015-09-17 DIAGNOSIS — Z1231 Encounter for screening mammogram for malignant neoplasm of breast: Secondary | ICD-10-CM | POA: Diagnosis not present

## 2015-09-17 LAB — HM MAMMOGRAPHY: HM Mammogram: NORMAL (ref 0–4)

## 2015-09-18 ENCOUNTER — Telehealth: Payer: Self-pay | Admitting: Family Medicine

## 2015-09-18 MED ORDER — GABAPENTIN 100 MG PO CAPS: 100.0000 mg | ORAL_CAPSULE | Freq: Every day | ORAL | Status: DC

## 2015-09-18 NOTE — Telephone Encounter (Signed)
Rx sent to pharmacy, notified pt

## 2015-09-18 NOTE — Telephone Encounter (Signed)
Refill OK

## 2015-09-18 NOTE — Telephone Encounter (Signed)
Pt request refill of the following:   GABAPENTIN  This is  not on her med list she said she was taking this for her foot and had it refill in Jan 2017    Phamacy: [facility] [address]

## 2015-09-18 NOTE — Telephone Encounter (Signed)
Medication has been removed from med list... As shown in med history last Rx sent on 07/07/14, #30 with 5 refills... 100mg  capsule once daily at bedtime... Okay to refill?

## 2015-09-21 ENCOUNTER — Other Ambulatory Visit: Payer: Self-pay | Admitting: Family Medicine

## 2015-09-24 ENCOUNTER — Other Ambulatory Visit: Payer: Self-pay | Admitting: *Deleted

## 2015-09-24 MED ORDER — METOCLOPRAMIDE HCL 5 MG PO TABS: 5.0000 mg | ORAL_TABLET | Freq: Two times a day (BID) | ORAL | Status: DC

## 2015-09-25 NOTE — Progress Notes (Signed)
Chief Complaint  Patient presents with  . Possible UTI    lower abd pain    HPI: Rebecca [name] 82 y.o.  Patient comes in today for [facility] Saturday clinic for  new problem evaluation. pcp na dr B  No fever hematuria      Uncomfortable  Around urethra and lower abd  Stomach    Began 2 days ago .  Hx recurrent utis last one mult spc  Used  Given macrobid   All sulfa and cipro  ROS: See pertinent positives and negatives per HPI. No fever chills    Past Medical History  Diagnosis Date  . Hypertension   . Diverticulosis   . Anxiety   . Hemorrhoids   . Gastroparesis   . Anemia   . Hyperlipidemia   . Rectal ulcer   . Diverticulitis   . Tubular adenoma of colon 05/2009  . GERD (gastroesophageal reflux disease)   . Hypertension   . IBS (irritable bowel syndrome)   . Segmental colitis ([place])     Family History  Problem Relation Age of Onset  . Crohn's disease Neg Hx   . Migraines Daughter   . Colon polyps Sister   . Lung cancer Sister   . Colon polyps Brother   . Diabetes Brother   . Hyperlipidemia Brother   . Hypertension Brother   . Colon polyps Daughter   . Breast cancer Daughter   . Colon cancer Maternal Aunt   . Diabetes Mother   . Heart disease Mother   . Hyperlipidemia Mother   . Hypertension Mother   . Cancer Daughter     salivary  . Anuerysm Father   . Heart disease Father   . Hypertension Father   . Colon cancer Paternal Grandmother   . Heart disease Brother     Social History   Social History  . Marital Status: Widowed    Spouse Name: N/A  . Number of Children: N/A  . Years of Education: N/A   Social History Main Topics  . Smoking status: Never Smoker   . Smokeless tobacco: Never Used  . Alcohol Use: 0.0 oz/week    0 Standard drinks or equivalent per week     Comment: rare  . Drug Use: No  . Sexual Activity: Yes   Other Topics Concern  . None   Social History Narrative    Outpatient Prescriptions Prior to Visit  Medication Sig  Dispense Refill  . ALPRAZolam (XANAX) 0.25 MG tablet take 1 tablet by mouth at bedtime if needed for anxiety 30 tablet 5  . Calcium Carbonate-Vitamin D (CALCIUM-VITAMIN D) 500-200 MG-UNIT per tablet Take 1 tablet by mouth 2 (two) times daily with a meal.      . Cholecalciferol (VITAMIN D3) 2000 UNITS TABS Take 1 tablet by mouth every morning.     [name] Kitchen esomeprazole (NEXIUM) 20 MG capsule Take 20 mg by mouth 2 (two) times daily before a meal.    . ESTRACE VAGINAL 0.1 MG/GM vaginal cream Reported on 04/22/2015  0  . gabapentin (NEURONTIN) 100 MG capsule Take 1 capsule (100 mg total) by mouth at bedtime. 90 capsule 0  . iron polysaccharides (IFEREX 150) 150 MG capsule Take 1 capsule (150 mg total) by mouth daily. 30 capsule 5  . levothyroxine (SYNTHROID) 88 MCG tablet Take 1 tablet (88 mcg total) by mouth daily. 30 tablet 3  . losartan (COZAAR) 100 MG tablet Take 1 tablet (100 mg total) by mouth daily. 90 tablet  2  . meloxicam (MOBIC) 15 MG tablet Take 1 tablet (15 mg total) by mouth daily. 30 tablet 0  . metoCLOPramide (REGLAN) 5 MG tablet Take 1 tablet (5 mg total) by mouth 2 (two) times daily with a meal. 60 tablet 5  . ondansetron (ZOFRAN ODT) 4 MG disintegrating tablet Take 1 tablet (4 mg total) by mouth every 8 (eight) hours as needed for nausea or vomiting. 15 tablet 0  . ranitidine (ZANTAC) 300 MG tablet Take 1 tablet (300 mg total) by mouth at bedtime. 30 tablet 11  . simvastatin (ZOCOR) 20 MG tablet Take 1 tablet (20 mg total) by mouth at bedtime. 90 tablet 3  . vitamin B-12 (CYANOCOBALAMIN) 500 MCG tablet Take 500 mcg by mouth every morning.     . vitamin C (ASCORBIC ACID) 500 MG tablet Take 500 mg by mouth daily.    . vitamin E (VITAMIN E) 400 UNIT capsule Take 400 Units by mouth daily.    . Zoledronic Acid (RECLAST IV) Inject into the vein. Once per year.    . nitrofurantoin, macrocrystal-monohydrate, (MACROBID) 100 MG capsule Take 1 capsule (100 mg total) by mouth 2 (two) times daily. 10  capsule 0   No facility-administered medications prior to visit.     EXAM:  BP 142/82 mmHg  Pulse 63  Temp(Src) 98.8 F (37.1 C) (Oral)  Resp 14  Wt 127 lb 8 oz (57.834 kg)  SpO2 98%  Body mass index is 22.59 kg/(m^2).  GENERAL: vitals reviewed and listed above, alert, oriented, appears well hydrated and in no acute distress HEENT: atraumatic, conjunctiva  clear, no obvious abnormalities on inspection of external nose and ears NECK: no obvious masses on inspection palpation  LUNGS: clear to auscultation bilaterally, no wheezes, rales or rhonchi, good air movement Abdomen:  Sof,t normal bowel sounds without hepatosplenomegaly, no guarding rebound or masses no CVA tenderness  midl tender suprapubic area   MS: moves all extremities without noticeable focal  Abnormality nl gait  PSYCH: pleasant and cooperative, no obvious depression or anxiety Lab Results  Component Value Date   WBC 9.3 09/24/2014   HGB 12.3 09/24/2014   HCT 37.3 09/24/2014   PLT 247.0 09/24/2014   GLUCOSE 101* 12/31/2014   CHOL 223* 06/24/2015   TRIG 260.0* 06/24/2015   HDL 42.10 06/24/2015   LDLDIRECT 106.0 06/24/2015   LDLCALC 130* 12/25/2013   ALT 18 12/31/2014   AST 18 12/31/2014   NA 133* 12/31/2014   K 4.2 12/31/2014   CL 98 12/31/2014   CREATININE 0.78 12/31/2014   BUN 9 12/31/2014   CO2 28 12/31/2014   TSH 1.43 12/31/2014   HGBA1C 5.6 07/26/2006   ua 3+ leuk ASSESSMENT AND PLAN:  Discussed the following assessment and plan:  Recurrent UTI  Abnormal urinalysis - Plan: Urine culture  Abdominal pain, unspecified abdominal location - Plan: POCT Urinalysis Dipstick (Automated)  -Patient advised to return or notify health care team  if symptoms worsen ,persist or new concerns arise.  Patient Instructions  This is probably another UTI  Urine culture pending  Will let you know results when aviable.  Antibiotic   Fu with pcp if  persistent or progressive    Mariann Laster K. Denarius Sesler M.D.

## 2015-09-26 ENCOUNTER — Other Ambulatory Visit (HOSPITAL_COMMUNITY)
Admission: RE | Admit: 2015-09-26 | Discharge: 2015-09-26 | Disposition: A | Discharge status: Home / Self Care | Payer: Medicare Other | Source: Other Acute Inpatient Hospital | Attending: Internal Medicine | Admitting: Internal Medicine

## 2015-09-26 ENCOUNTER — Ambulatory Visit (INDEPENDENT_AMBULATORY_CARE_PROVIDER_SITE_OTHER): Payer: Medicare Other | Admitting: Internal Medicine

## 2015-09-26 ENCOUNTER — Encounter: Payer: Self-pay | Admitting: Internal Medicine

## 2015-09-26 VITALS — BP 142/82 | HR 63 | Temp 98.8°F | Resp 14 | Wt 127.5 lb

## 2015-09-26 DIAGNOSIS — R109 Unspecified abdominal pain: Secondary | ICD-10-CM

## 2015-09-26 DIAGNOSIS — R8290 Unspecified abnormal findings in urine: Secondary | ICD-10-CM | POA: Insufficient documentation

## 2015-09-26 DIAGNOSIS — N390 Urinary tract infection, site not specified: Secondary | ICD-10-CM

## 2015-09-26 DIAGNOSIS — I6523 Occlusion and stenosis of bilateral carotid arteries: Secondary | ICD-10-CM | POA: Diagnosis not present

## 2015-09-26 LAB — POC URINALSYSI DIPSTICK (AUTOMATED)
Bilirubin, UA: NEGATIVE
Blood, UA: NEGATIVE
Glucose, UA: NEGATIVE
Ketones, UA: NEGATIVE
Nitrite, UA: NEGATIVE
Protein, UA: NEGATIVE
Spec Grav, UA: 1.01
Urobilinogen, UA: 0.2
pH, UA: 7

## 2015-09-26 MED ORDER — NITROFURANTOIN MONOHYD MACRO 100 MG PO CAPS: 100.0000 mg | ORAL_CAPSULE | Freq: Two times a day (BID) | ORAL | Status: DC

## 2015-09-26 NOTE — Progress Notes (Signed)
Pre visit review using our clinic review tool, if applicable. No additional management support is needed unless otherwise documented below in the visit note. 

## 2015-09-26 NOTE — Patient Instructions (Signed)
This is probably another UTI  Urine culture pending  Will let you know results when aviable.  Antibiotic   Fu with pcp if  persistent or progressive

## 2015-09-26 NOTE — Addendum Note (Signed)
Addended by: Tammi Sou on: 09/26/2015 10:24 AM   Modules accepted: Orders

## 2015-09-27 LAB — URINE CULTURE

## 2015-09-29 NOTE — Progress Notes (Signed)
Quick Note:  Tell pt urine culture showed multiple bacteria but not predominant germ. Cannot tell if she has UTI. But take medicine and fu with your pcp ______

## 2015-10-09 ENCOUNTER — Other Ambulatory Visit: Payer: Self-pay | Admitting: *Deleted

## 2015-10-09 MED ORDER — LEVOTHYROXINE SODIUM 88 MCG PO TABS: 88.0000 ug | ORAL_TABLET | Freq: Every day | ORAL | Status: DC

## 2015-10-27 ENCOUNTER — Other Ambulatory Visit: Payer: Self-pay | Admitting: Emergency Medicine

## 2015-10-27 MED ORDER — MELOXICAM 15 MG PO TABS: 15.0000 mg | ORAL_TABLET | Freq: Every day | ORAL | 1 refills | Status: DC

## 2015-11-10 DIAGNOSIS — L900 Lichen sclerosus et atrophicus: Secondary | ICD-10-CM | POA: Diagnosis not present

## 2015-11-10 DIAGNOSIS — N816 Rectocele: Secondary | ICD-10-CM | POA: Diagnosis not present

## 2015-11-10 DIAGNOSIS — N8111 Cystocele, midline: Secondary | ICD-10-CM | POA: Diagnosis not present

## 2015-12-02 ENCOUNTER — Other Ambulatory Visit: Payer: Self-pay

## 2015-12-10 NOTE — Telephone Encounter (Signed)
Error

## 2015-12-15 DIAGNOSIS — N816 Rectocele: Secondary | ICD-10-CM | POA: Diagnosis not present

## 2015-12-23 DIAGNOSIS — N760 Acute vaginitis: Secondary | ICD-10-CM | POA: Diagnosis not present

## 2015-12-23 DIAGNOSIS — N816 Rectocele: Secondary | ICD-10-CM | POA: Diagnosis not present

## 2016-01-01 ENCOUNTER — Ambulatory Visit (INDEPENDENT_AMBULATORY_CARE_PROVIDER_SITE_OTHER): Payer: Medicare Other | Admitting: Family Medicine

## 2016-01-01 VITALS — BP 140/90 | HR 59 | Temp 98.0°F | Ht 63.0 in | Wt 126.0 lb

## 2016-01-01 DIAGNOSIS — D509 Iron deficiency anemia, unspecified: Secondary | ICD-10-CM

## 2016-01-01 DIAGNOSIS — R251 Tremor, unspecified: Secondary | ICD-10-CM | POA: Diagnosis not present

## 2016-01-01 DIAGNOSIS — M48061 Spinal stenosis, lumbar region without neurogenic claudication: Secondary | ICD-10-CM

## 2016-01-01 DIAGNOSIS — Z23 Encounter for immunization: Secondary | ICD-10-CM

## 2016-01-01 DIAGNOSIS — E038 Other specified hypothyroidism: Secondary | ICD-10-CM | POA: Diagnosis not present

## 2016-01-01 DIAGNOSIS — I6523 Occlusion and stenosis of bilateral carotid arteries: Secondary | ICD-10-CM | POA: Diagnosis not present

## 2016-01-01 DIAGNOSIS — I10 Essential (primary) hypertension: Secondary | ICD-10-CM

## 2016-01-01 DIAGNOSIS — E785 Hyperlipidemia, unspecified: Secondary | ICD-10-CM

## 2016-01-01 DIAGNOSIS — Z0000 Encounter for general adult medical examination without abnormal findings: Secondary | ICD-10-CM

## 2016-01-01 DIAGNOSIS — M4806 Spinal stenosis, lumbar region: Secondary | ICD-10-CM

## 2016-01-01 DIAGNOSIS — E559 Vitamin D deficiency, unspecified: Secondary | ICD-10-CM | POA: Diagnosis not present

## 2016-01-01 LAB — CBC WITH DIFFERENTIAL/PLATELET
Basophils Absolute: 0 10*3/uL (ref 0.0–0.1)
Basophils Relative: 0.3 % (ref 0.0–3.0)
Eosinophils Absolute: 0.2 10*3/uL (ref 0.0–0.7)
Eosinophils Relative: 1.8 % (ref 0.0–5.0)
HCT: 39 % (ref 36.0–46.0)
Hemoglobin: 13.2 g/dL (ref 12.0–15.0)
Lymphocytes Relative: 45.8 % (ref 12.0–46.0)
Lymphs Abs: 4.3 10*3/uL — ABNORMAL HIGH (ref 0.7–4.0)
MCHC: 33.8 g/dL (ref 30.0–36.0)
MCV: 99 fl (ref 78.0–100.0)
Monocytes Absolute: 0.7 10*3/uL (ref 0.1–1.0)
Monocytes Relative: 7.4 % (ref 3.0–12.0)
Neutro Abs: 4.1 10*3/uL (ref 1.4–7.7)
Neutrophils Relative %: 44.7 % (ref 43.0–77.0)
Platelets: 219 10*3/uL (ref 150.0–400.0)
RBC: 3.94 Mil/uL (ref 3.87–5.11)
RDW: 13.1 % (ref 11.5–15.5)
WBC: 9.3 10*3/uL (ref 4.0–10.5)

## 2016-01-01 LAB — VITAMIN D 25 HYDROXY (VIT D DEFICIENCY, FRACTURES): VITD: 47.49 ng/mL (ref 30.00–100.00)

## 2016-01-01 LAB — BASIC METABOLIC PANEL
BUN: 11 mg/dL (ref 6–23)
CO2: 28 mEq/L (ref 19–32)
Calcium: 9.7 mg/dL (ref 8.4–10.5)
Chloride: 102 mEq/L (ref 96–112)
Creatinine, Ser: 0.94 mg/dL (ref 0.40–1.20)
GFR: 60.77 mL/min (ref >60.00)
Glucose, Bld: 90 mg/dL (ref 70–99)
Potassium: 4.2 mEq/L (ref 3.5–5.1)
Sodium: 137 mEq/L (ref 135–145)

## 2016-01-01 LAB — TSH: TSH: 0.85 u[IU]/mL (ref 0.35–4.50)

## 2016-01-01 MED ORDER — PREDNISONE 10 MG PO TABS: ORAL_TABLET | ORAL | 0 refills | Status: DC

## 2016-01-01 NOTE — Progress Notes (Signed)
Subjective:     Patient ID: Rebecca [name], female   DOB: 11-Mar-1933, 82 y.o.   MRN: [id number]  HPI Patient here for Medicare wellness exam and medical follow-up. She's had some ongoing problems with low back pain and recently recurrent flare of right lumbar radiculitis symptoms. 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 She was referred to neurosurgery and had steroid injection which held for several months. She is requesting oral prednisone which helped temporarily. No weakness. Pain is moderate and worse with ambulation.  She has multiple chronic problems including hypothyroidism, hyperlipidemia, hypertension, GERD, history of gastroparesis. Her gastroparesis has been controlled with low-dose Reglan once daily.  She complains of very mild tremor involving both hands. This does not extinguish with movement. No clear exacerbating factors. Minimal caffeine use. She is not aware of Rebecca family history of tremor.  No alleviating factors.  Symptoms are mild.    Past Medical History:  Diagnosis Date  . Anemia   . Anxiety   . Diverticulitis   . Diverticulosis   . Gastroparesis   . GERD (gastroesophageal reflux disease)   . Hemorrhoids   . Hyperlipidemia   . Hypertension   . Hypertension   . IBS (irritable bowel syndrome)   . Rectal ulcer   . Segmental colitis ([place])   . Tubular adenoma of colon 05/2009   Past Surgical History:  Procedure Laterality Date  . APPENDECTOMY    . ARCUATE KERATECTOMY    . double pallital tori bone removed     . HERNIA REPAIR     umbilical  . KNEE SURGERY Bilateral   . morton's neuroma on rt foot and platar facial release    . MOUTH SURGERY     "bone shaved off roof of mouth"  . SALIVARY GLAND SURGERY     removal  . TONSILLECTOMY      reports that she has never smoked. She has never used smokeless tobacco. She reports that she drinks alcohol. She reports that she does not use drugs. family history includes  Anuerysm in her father; Breast cancer in her daughter; Cancer in her daughter; Colon cancer in her maternal aunt and paternal grandmother; Colon polyps in her brother, daughter, and sister; Diabetes in her brother and mother; Heart disease in her brother, father, and mother; Hyperlipidemia in her brother and mother; Hypertension in her brother, father, and mother; Lung cancer in her sister; Migraines in her daughter. Allergies  Allergen Reactions  . Levaquin [Levofloxacin] Other (See Comments)    Very nauseous and shakey  . Ciprofloxacin Nausea Only    REACTION: Thrush, Nausea, Shakey  . Codeine   . Lactose Intolerance (Gi)   . Pneumococcal Vaccine Polyvalent     REACTION: RED AND RAISED RASH ON ARM  . Sulfamethoxazole-Trimethoprim   . Tramadol Hcl   . Adhesive [Tape] Rash   1.  Risk factors based on Past Medical , Social, and Family history reviewed and as indicated above with no changes 2.  Limitations in physical activities None.  No recent falls. 3.  Depression/mood No active depression or anxiety issues 4.  Hearing has bilateral hearing aids which are working well. 5.  ADLs independent in all. 6.  Cognitive function (orientation to time and place, language, writing, speech,memory) no short or long term memory issues.  Language and judgement intact. 7.  Home Safety no issues 8.  Height, weight, and visual acuity.all stable. 9.  Counseling discussed fall prevention/exercise 10. Recommendation of  preventive services. Flu vaccine given. Other immunizations up to date 40. Labs based on risk factors-TSH, BMP,CBC, Vit D 12. Care Plan as above 13. Other Providers GI-Dr Fuller Plan. 14. Written schedule of screening/prevention services given to patient.   Review of Systems  Constitutional: Negative for activity change, appetite change, fatigue, fever and unexpected weight change.  HENT: Negative for ear pain, hearing loss, sore throat and trouble swallowing.   Eyes: Negative for visual  disturbance.  Respiratory: Negative for cough and shortness of breath.   Cardiovascular: Negative for chest pain and palpitations.  Gastrointestinal: Negative for abdominal pain, blood in stool, constipation and diarrhea.  Genitourinary: Negative for dysuria and hematuria.  Musculoskeletal: Positive for arthralgias and back pain. Negative for myalgias.  Skin: Negative for rash.  Neurological: Positive for tremors. Negative for dizziness, syncope and headaches.  Hematological: Negative for adenopathy.  Psychiatric/Behavioral: Negative for confusion and dysphoric mood.       Objective:   Physical Exam  Constitutional: She is oriented to person, place, and time. She appears well-developed and well-nourished.  HENT:  Head: Normocephalic and atraumatic.  Eyes: EOM are normal. Pupils are equal, round, and reactive to light.  Neck: Normal range of motion. Neck supple. No thyromegaly present.  Cardiovascular: Normal rate, regular rhythm and normal heart sounds.   No murmur heard. Pulmonary/Chest: Breath sounds normal. No respiratory distress. She has no wheezes. She has no rales.  Abdominal: Soft. Bowel sounds are normal. She exhibits no distension and no mass. There is no tenderness. There is no rebound and no guarding.  Musculoskeletal: Normal range of motion. She exhibits no edema.  Lymphadenopathy:    She has no cervical adenopathy.  Neurological: She is alert and oriented to person, place, and time. She displays normal reflexes. No cranial nerve deficit.  Patient has very faint tremor involving both hands and this does not extinguish with movement. No cogwheel rigidity.  Skin: No rash noted.  Psychiatric: She has a normal mood and affect. Her behavior is normal. Judgment and thought content normal.       Assessment:     #1 Medicare annual wellness visit. Needs flu vaccine. Other immunizations up-to-date. Mammogram and colonoscopy up-to-date  #2 hypothyroidism  #3  hyperlipidemia  #4 hypertension  #5 bilateral hand tremor probably essential tremor.  No other features at this time to suggest Parkinson's.    #6 right lumbar radiculitis in a patient with known foraminal stenosis L4-5 and L5-S1  #7 history of iron deficiency anemia with poor absorption    Plan:     -Flu vaccine given -Obtain labs with TSH, basic metabolic panel, CBC -Prednisone taper over the next 8 days -Follow-up with neurosurgery if not improved with the above -continue with daily Ca and Vit D and regular weight bearing exercise.  [name] MD [facility]

## 2016-01-02 ENCOUNTER — Encounter: Payer: Self-pay | Admitting: Family Medicine

## 2016-01-07 ENCOUNTER — Other Ambulatory Visit: Payer: Self-pay | Admitting: Family Medicine

## 2016-01-13 ENCOUNTER — Other Ambulatory Visit: Payer: Self-pay | Admitting: Family Medicine

## 2016-01-28 ENCOUNTER — Other Ambulatory Visit: Payer: Self-pay | Admitting: Family Medicine

## 2016-01-28 ENCOUNTER — Encounter: Payer: Self-pay | Admitting: Family Medicine

## 2016-01-28 ENCOUNTER — Ambulatory Visit (INDEPENDENT_AMBULATORY_CARE_PROVIDER_SITE_OTHER): Payer: Medicare Other | Admitting: Family Medicine

## 2016-01-28 VITALS — BP 118/78 | HR 64 | Temp 98.0°F | Wt 130.8 lb

## 2016-01-28 DIAGNOSIS — I6523 Occlusion and stenosis of bilateral carotid arteries: Secondary | ICD-10-CM

## 2016-01-28 DIAGNOSIS — R05 Cough: Secondary | ICD-10-CM | POA: Diagnosis not present

## 2016-01-28 DIAGNOSIS — J069 Acute upper respiratory infection, unspecified: Secondary | ICD-10-CM | POA: Diagnosis not present

## 2016-01-28 DIAGNOSIS — R059 Cough, unspecified: Secondary | ICD-10-CM

## 2016-01-28 MED ORDER — PREDNISONE 10 MG PO TABS: ORAL_TABLET | ORAL | 0 refills | Status: DC

## 2016-01-28 MED ORDER — BENZONATATE 100 MG PO CAPS: 100.0000 mg | ORAL_CAPSULE | Freq: Three times a day (TID) | ORAL | 0 refills | Status: DC

## 2016-01-28 NOTE — Patient Instructions (Signed)
It was a pleasure to see you today! Your symptoms today are most likely caused by viral illness. Please drink plenty of water enough for your urine to be pale yellow or clear. You may use Tylenol 325 mg every 6 hours as needed. Benzonatate for cough as well as prednisone has been provided. Follow-up for evaluation if your symptoms do not improve in 3-4 days, worsen, or you develop a fever greater than 100.   Upper Respiratory Infection, Adult Most upper respiratory infections (URIs) are a viral infection of the air passages leading to the lungs. A URI affects the nose, throat, and upper air passages. The most common type of URI is nasopharyngitis and is typically referred to as "the common cold." URIs run their course and usually go away on their own. Most of the time, a URI does not require medical attention, but sometimes a bacterial infection in the upper airways can follow a viral infection. This is called a secondary infection. Sinus and middle ear infections are common types of secondary upper respiratory infections. Bacterial pneumonia can also complicate a URI. A URI can worsen asthma and chronic obstructive pulmonary disease (COPD). Sometimes, these complications can require emergency medical care and may be life threatening.  CAUSES Almost all URIs are caused by viruses. A virus is a type of germ and can spread from one person to another.  RISKS FACTORS You may be at risk for a URI if:   You smoke.   You have chronic heart or lung disease.  You have a weakened defense (immune) system.   You are very young or very old.   You have nasal allergies or asthma.  You work in crowded or poorly ventilated areas.  You work in health care facilities or schools. SIGNS AND SYMPTOMS  Symptoms typically develop 2-3 days after you come in contact with a cold virus. Most viral URIs last 7-10 days. However, viral URIs from the influenza virus (flu virus) can last 14-18 days and are typically  more severe. Symptoms may include:   Runny or stuffy (congested) nose.   Sneezing.   Cough.   Sore throat.   Headache.   Fatigue.   Fever.   Loss of appetite.   Pain in your forehead, behind your eyes, and over your cheekbones (sinus pain).  Muscle aches.  DIAGNOSIS  Your health care provider may diagnose a URI by:  Physical exam.  Tests to check that your symptoms are not due to another condition such as:  Strep throat.  Sinusitis.  Pneumonia.  Asthma. TREATMENT  A URI goes away on its own with time. It cannot be cured with medicines, but medicines may be prescribed or recommended to relieve symptoms. Medicines may help:  Reduce your fever.  Reduce your cough.  Relieve nasal congestion. HOME CARE INSTRUCTIONS   Take medicines only as directed by your health care provider.   Gargle warm saltwater or take cough drops to comfort your throat as directed by your health care provider.  Use a warm mist humidifier or inhale steam from a shower to increase air moisture. This may make it easier to breathe.  Drink enough fluid to keep your urine clear or pale yellow.   Eat soups and other clear broths and maintain good nutrition.   Rest as needed.   Return to work when your temperature has returned to normal or as your health care provider advises. You may need to stay home longer to avoid infecting others. You can also use  a face mask and careful hand washing to prevent spread of the virus.  Increase the usage of your inhaler if you have asthma.   Do not use any tobacco products, including cigarettes, chewing tobacco, or electronic cigarettes. If you need help quitting, ask your health care provider. PREVENTION  The best way to protect yourself from getting a cold is to practice good hygiene.   Avoid oral or hand contact with people with cold symptoms.   Wash your hands often if contact occurs.  There is no clear evidence that vitamin C,  vitamin E, echinacea, or exercise reduces the chance of developing a cold. However, it is always recommended to get plenty of rest, exercise, and practice good nutrition.  SEEK MEDICAL CARE IF:   You are getting worse rather than better.   Your symptoms are not controlled by medicine.   You have chills.  You have worsening shortness of breath.  You have brown or red mucus.  You have yellow or brown nasal discharge.  You have pain in your face, especially when you bend forward.  You have a fever.  You have swollen neck glands.  You have pain while swallowing.  You have white areas in the back of your throat. SEEK IMMEDIATE MEDICAL CARE IF:   You have severe or persistent:  Headache.  Ear pain.  Sinus pain.  Chest pain.  You have chronic lung disease and any of the following:  Wheezing.  Prolonged cough.  Coughing up blood.  A change in your usual mucus.  You have a stiff neck.  You have changes in your:  Vision.  Hearing.  Thinking.  Mood. MAKE SURE YOU:   Understand these instructions.  Will watch your condition.  Will get help right away if you are not doing well or get worse.   This information is not intended to replace advice given to you by your health care provider. Make sure you discuss any questions you have with your health care provider.   Document Released: 09/14/2000 Document Revised: 08/05/2014 Document Reviewed: 06/26/2013 Elsevier Interactive Patient Education [employer].

## 2016-01-28 NOTE — Progress Notes (Signed)
Subjective:    Patient ID: Rebecca [name], female    DOB: Dec 11, 1932, 82 y.o.   MRN: [id number]  HPI  Ms. Robeck is an 82 year old female who presents today with a cough that started on 01/11/2016. Associated symptoms of rhinitis, nasal congestion, post nasal drip were present but have improved. Cough is now productive with thick, white mucous "at times" but does not wake patient at night and is noted with post nasal drip mainly.  Denies fever, chills, sweats, ear tooth, tooth pain, N/V/D, myalgias, and HAs.  No history of asthma/bronchitis. She is not a smoker. Treatment at home with Mucinex, flonase, and some benzonatate that she had from a previous cough which provided moderate benefit. No recent sick contact exposure and no recent antibiotic therapy. Aggravating factor of post nasal drip noted; no alleviating factors noted.  Review of Systems  Constitutional: Negative for chills, fatigue and fever.  HENT: Positive for congestion, postnasal drip and rhinorrhea. Negative for sinus pressure, sneezing and sore throat.   Eyes: Negative for visual disturbance.  Respiratory: Positive for cough. Negative for shortness of breath and wheezing.   Cardiovascular: Negative for chest pain and palpitations.  Gastrointestinal: Negative for abdominal pain, diarrhea, nausea and vomiting.  Genitourinary: Negative for dysuria, frequency and hematuria.  Musculoskeletal: Negative for myalgias.  Skin: Negative for rash.  Neurological: Negative for dizziness, weakness, light-headedness and headaches.   Past Medical History:  Diagnosis Date  . Anemia   . Anxiety   . Diverticulitis   . Diverticulosis   . Gastroparesis   . GERD (gastroesophageal reflux disease)   . Hemorrhoids   . Hyperlipidemia   . Hypertension   . Hypertension   . IBS (irritable bowel syndrome)   . Rectal ulcer   . 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 Tubular adenoma of colon 05/2009     Social History   Social History  . Marital  status: Widowed    Spouse name: N/A  . Number of children: N/A  . Years of education: N/A   Occupational History  . Not on file.   Social History Main Topics  . Smoking status: Never Smoker  . Smokeless tobacco: Never Used  . Alcohol use 0.0 oz/week     Comment: rare  . Drug use: No  . Sexual activity: Yes   Other Topics Concern  . Not on file   Social History Narrative  . No narrative on file    Past Surgical History:  Procedure Laterality Date  . APPENDECTOMY    . ARCUATE KERATECTOMY    . double pallital tori bone removed     . HERNIA REPAIR     umbilical  . KNEE SURGERY Bilateral   . morton's neuroma on rt foot and platar facial release    . MOUTH SURGERY     "bone shaved off roof of mouth"  . SALIVARY GLAND SURGERY     removal  . TONSILLECTOMY      Family History  Problem Relation Age of Onset  . Crohn's disease Neg Hx   . Migraines Daughter   . Colon polyps Sister   . Lung cancer Sister   . Colon polyps Brother   . Diabetes Brother   . Hyperlipidemia Brother   . Hypertension Brother   . Colon polyps Daughter   . Breast cancer Daughter   . Colon cancer Maternal Aunt   . Diabetes Mother   . Heart disease Mother   . Hyperlipidemia Mother   .  Hypertension Mother   . Cancer Daughter     salivary  . Anuerysm Father   . Heart disease Father   . Hypertension Father   . Colon cancer Paternal Grandmother   . Heart disease Brother     Allergies  Allergen Reactions  . Levaquin [Levofloxacin] Other (See Comments)    Very nauseous and shakey  . Ciprofloxacin Nausea Only    REACTION: Thrush, Nausea, Shakey  . Codeine   . Lactose Intolerance (Gi)   . Pneumococcal Vaccine Polyvalent     REACTION: RED AND RAISED RASH ON ARM  . Sulfamethoxazole-Trimethoprim   . Tramadol Hcl   . Adhesive [Tape] Rash    Current Outpatient Prescriptions on File Prior to Visit  Medication Sig Dispense Refill  . ALPRAZolam (XANAX) 0.25 MG tablet take 1 tablet by mouth at  bedtime if needed for anxiety 30 tablet 5  . Calcium Carbonate-Vitamin D (CALCIUM-VITAMIN D) 500-200 MG-UNIT per tablet Take 1 tablet by mouth 2 (two) times daily with a meal.      . Cholecalciferol (VITAMIN D3) 2000 UNITS TABS Take 1 tablet by mouth every morning.     [name] Kitchen esomeprazole (NEXIUM) 20 MG capsule Take 20 mg by mouth 2 (two) times daily before a meal.    . gabapentin (NEURONTIN) 100 MG capsule take 1 capsule by mouth at bedtime 90 capsule 1  . IFEREX 150 150 MG capsule take 1 capsule by mouth once daily 30 capsule 3  . losartan (COZAAR) 100 MG tablet Take 1 tablet (100 mg total) by mouth daily. 90 tablet 2  . meloxicam (MOBIC) 15 MG tablet Take 1 tablet (15 mg total) by mouth daily. 30 tablet 1  . metoCLOPramide (REGLAN) 5 MG tablet Take 1 tablet (5 mg total) by mouth 2 (two) times daily with a meal. 60 tablet 5  . ondansetron (ZOFRAN ODT) 4 MG disintegrating tablet Take 1 tablet (4 mg total) by mouth every 8 (eight) hours as needed for nausea or vomiting. 15 tablet 0  . ranitidine (ZANTAC) 300 MG tablet Take 1 tablet (300 mg total) by mouth at bedtime. 30 tablet 11  . simvastatin (ZOCOR) 20 MG tablet Take 1 tablet (20 mg total) by mouth at bedtime. 90 tablet 3  . SYNTHROID 88 MCG tablet take 1 tablet by mouth once daily 30 tablet 5  . vitamin B-12 (CYANOCOBALAMIN) 500 MCG tablet Take 500 mcg by mouth every morning.     . vitamin C (ASCORBIC ACID) 500 MG tablet Take 500 mg by mouth daily.    . vitamin E (VITAMIN E) 400 UNIT capsule Take 400 Units by mouth daily.    . Zoledronic Acid (RECLAST IV) Inject into the vein. Once per year.    . [DISCONTINUED] olmesartan (BENICAR) 20 MG tablet Take 20 mg by mouth daily.       No current facility-administered medications on file prior to visit.     BP 118/78 (BP Location: Left Arm, Patient Position: Sitting, Cuff Size: Normal)   Pulse 64   Temp 98 F (36.7 C) (Oral)   Wt 130 lb 12.8 oz (59.3 kg)   SpO2 96%   BMI 23.17 kg/m        Objective:   Physical Exam  Constitutional: She is oriented to person, place, and time. She appears well-developed and well-nourished.  HENT:  Nose: Rhinorrhea present. Right sinus exhibits no maxillary sinus tenderness and no frontal sinus tenderness. Left sinus exhibits no maxillary sinus tenderness and no frontal sinus tenderness.  Mouth/Throat: Mucous membranes are normal. No oropharyngeal exudate or posterior oropharyngeal erythema.  Decline ear exam due to wearing hearing aides. She denies ear pain or pressure  Eyes: Pupils are equal, round, and reactive to light. No scleral icterus.  Neck: Neck supple.  Cardiovascular: Normal rate and regular rhythm.   Pulmonary/Chest: Effort normal and breath sounds normal. She has no wheezes. She has no rales.  Lymphadenopathy:    She has no cervical adenopathy.  Neurological: She is alert and oriented to person, place, and time. Coordination normal.  Skin: Skin is warm and dry. No rash noted.      Assessment & Plan:  1. Acute upper respiratory infection Suspect that symptoms are resolving and viral in nature.  Advised patient on supportive measures:  Get rest, drink plenty of fluids, and use tylenol as needed for pain. Follow up if fever >100, if symptoms worsen or if symptoms are not improved in 3 to 4 days. Patient verbalizes understanding.    2. Cough Continue flonase, Mucinex for symptoms with benzonatate as needed. Short course of prednisone provided for cough. Advised follow up as stated above. - benzonatate (TESSALON) 100 MG capsule; Take 1 capsule (100 mg total) by mouth 3 (three) times daily.  Dispense: 20 capsule; Refill: 0 - predniSONE (DELTASONE) 10 MG tablet; Take4 tablets once daily for 2 days, 3 tabs daily for 2 days, 2 tabs daily for 2 days, 1 tab daily for 2 days.  Dispense: 20 tablet; Refill: 0  Symptoms are improving; continue supportive measures and begin prednisone as stated  above, follow up as directed above.   Delano Metz, FNP-C

## 2016-02-02 DIAGNOSIS — H40011 Open angle with borderline findings, low risk, right eye: Secondary | ICD-10-CM | POA: Diagnosis not present

## 2016-02-02 DIAGNOSIS — H52203 Unspecified astigmatism, bilateral: Secondary | ICD-10-CM | POA: Diagnosis not present

## 2016-02-02 DIAGNOSIS — H40012 Open angle with borderline findings, low risk, left eye: Secondary | ICD-10-CM | POA: Diagnosis not present

## 2016-02-23 ENCOUNTER — Telehealth: Payer: Self-pay | Admitting: Family Medicine

## 2016-02-23 ENCOUNTER — Emergency Department (HOSPITAL_COMMUNITY): Payer: Medicare Other

## 2016-02-23 ENCOUNTER — Encounter (HOSPITAL_COMMUNITY): Payer: Self-pay | Admitting: Emergency Medicine

## 2016-02-23 ENCOUNTER — Emergency Department (HOSPITAL_COMMUNITY)
Admission: EM | Admit: 2016-02-23 | Discharge: 2016-02-23 | Disposition: A | Discharge status: Home / Self Care | Payer: Medicare Other | Attending: Emergency Medicine | Admitting: Emergency Medicine

## 2016-02-23 DIAGNOSIS — I10 Essential (primary) hypertension: Secondary | ICD-10-CM | POA: Diagnosis not present

## 2016-02-23 DIAGNOSIS — R1030 Lower abdominal pain, unspecified: Secondary | ICD-10-CM | POA: Diagnosis present

## 2016-02-23 DIAGNOSIS — K6389 Other specified diseases of intestine: Secondary | ICD-10-CM | POA: Diagnosis not present

## 2016-02-23 DIAGNOSIS — Z79899 Other long term (current) drug therapy: Secondary | ICD-10-CM | POA: Diagnosis not present

## 2016-02-23 DIAGNOSIS — E039 Hypothyroidism, unspecified: Secondary | ICD-10-CM | POA: Insufficient documentation

## 2016-02-23 DIAGNOSIS — K529 Noninfective gastroenteritis and colitis, unspecified: Secondary | ICD-10-CM | POA: Diagnosis not present

## 2016-02-23 LAB — URINALYSIS, ROUTINE W REFLEX MICROSCOPIC
Bilirubin Urine: NEGATIVE
Glucose, UA: NEGATIVE mg/dL
Hgb urine dipstick: NEGATIVE
Ketones, ur: NEGATIVE mg/dL
Nitrite: NEGATIVE
Protein, ur: NEGATIVE mg/dL
Specific Gravity, Urine: 1.013 (ref 1.005–1.030)
pH: 7 (ref 5.0–8.0)

## 2016-02-23 LAB — URINE MICROSCOPIC-ADD ON: RBC / HPF: NONE SEEN RBC/hpf (ref 0–5)

## 2016-02-23 LAB — GASTROINTESTINAL PANEL BY PCR, STOOL (REPLACES STOOL CULTURE)

## 2016-02-23 LAB — CBC
HCT: 37.4 % (ref 36.0–46.0)
Hemoglobin: 12.5 g/dL (ref 12.0–15.0)
MCH: 32.4 pg (ref 26.0–34.0)
MCHC: 33.4 g/dL (ref 30.0–36.0)
MCV: 96.9 fL (ref 78.0–100.0)
Platelets: 200 10*3/uL (ref 150–400)
RBC: 3.86 MIL/uL — ABNORMAL LOW (ref 3.87–5.11)
RDW: 12.8 % (ref 11.5–15.5)
WBC: 9.9 10*3/uL (ref 4.0–10.5)

## 2016-02-23 LAB — COMPREHENSIVE METABOLIC PANEL
ALT: 20 U/L (ref 14–54)
AST: 22 U/L (ref 15–41)
Albumin: 4.1 g/dL (ref 3.5–5.0)
Alkaline Phosphatase: 51 U/L (ref 38–126)
Anion gap: 7 (ref 5–15)
BUN: 10 mg/dL (ref 6–20)
CO2: 24 mmol/L (ref 22–32)
Calcium: 9.6 mg/dL (ref 8.9–10.3)
Chloride: 104 mmol/L (ref 101–111)
Creatinine, Ser: 0.99 mg/dL (ref 0.44–1.00)
GFR calc Af Amer: >60 mL/min (ref >60)
GFR calc non Af Amer: 52 mL/min — ABNORMAL LOW (ref >60)
Glucose, Bld: 116 mg/dL — ABNORMAL HIGH (ref 65–99)
Potassium: 4.1 mmol/L (ref 3.5–5.1)
Sodium: 135 mmol/L (ref 135–145)
Total Bilirubin: 0.6 mg/dL (ref 0.3–1.2)
Total Protein: 6.4 g/dL — ABNORMAL LOW (ref 6.5–8.1)

## 2016-02-23 LAB — LIPASE, BLOOD: Lipase: 15 U/L (ref 11–51)

## 2016-02-23 MED ORDER — AMOXICILLIN-POT CLAVULANATE 875-125 MG PO TABS: 1.0000 | ORAL_TABLET | Freq: Two times a day (BID) | ORAL | 0 refills | Status: DC

## 2016-02-23 MED ORDER — IOPAMIDOL (ISOVUE-300) INJECTION 61%
100.0000 mL | Freq: Once | INTRAVENOUS | Status: AC | PRN
  Administered 2016-02-23: 100 mL via INTRAVENOUS

## 2016-02-23 MED ORDER — IOPAMIDOL (ISOVUE-300) INJECTION 61%
INTRAVENOUS | Status: AC
  Filled 2016-02-23: qty 30

## 2016-02-23 MED ORDER — IOPAMIDOL (ISOVUE-300) INJECTION 61%
15.0000 mL | Freq: Once | INTRAVENOUS | Status: AC | PRN
  Administered 2016-02-23: 30 mL via ORAL

## 2016-02-23 MED ORDER — ACIDOPHILUS PROBIOTIC 10 MG PO TABS: 10.0000 mg | ORAL_TABLET | Freq: Three times a day (TID) | ORAL | 0 refills | Status: DC

## 2016-02-23 MED ORDER — SODIUM CHLORIDE 0.9 % IV BOLUS (SEPSIS)
1000.0000 mL | Freq: Once | INTRAVENOUS | Status: AC
  Administered 2016-02-23: 1000 mL via INTRAVENOUS

## 2016-02-23 MED ORDER — IOPAMIDOL (ISOVUE-300) INJECTION 61%
INTRAVENOUS | Status: AC
  Filled 2016-02-23: qty 100

## 2016-02-23 MED ORDER — ONDANSETRON HCL 4 MG/2ML IJ SOLN
4.0000 mg | Freq: Once | INTRAMUSCULAR | Status: AC
  Administered 2016-02-23: 4 mg via INTRAVENOUS
  Filled 2016-02-23: qty 2

## 2016-02-23 NOTE — ED Provider Notes (Signed)
[facility] DEPT Provider Note   CSN: [id number] Arrival date & time: 02/23/16  1102     History   Chief Complaint Chief Complaint  Patient presents with  . Abdominal Pain  . Diarrhea    HPI Rebecca [name] is a 82 y.o. female.  Rebecca [name] is a 82 y.o. Female who presents to the ED complaining of lower abdominal pain and diarrhea for the past three days. Patient reports she began having watery diarrhea with multiple loose stools starting 3 days ago. She reports multiple loose and watery stools today. No hematochezia. She reports some nausea but no vomiting. She claims the pain diffusely to her lower abdomen. She denies recent antibiotic use. No recent travel. No recent suspicious food intake. No sick contacts. She has a history of diverticulitis, gastroparesis, irritable bowel and segmental colitis. Previous abdominal surgical history includes an appendectomy. She reports she's been able to tolerate by mouth. Patient denies fevers, urinary symptoms, hematuria, vomiting, hematemesis, chest pain, shortness of breath, lightheadedness, dizziness, syncope, or rashes.    Abdominal Pain   Associated symptoms include diarrhea and nausea. Pertinent negatives include fever, vomiting, dysuria, frequency, hematuria and headaches.  Diarrhea   Associated symptoms include abdominal pain. Pertinent negatives include no vomiting and no headaches.    Past Medical History:  Diagnosis Date  . Anemia   . Anxiety   . Diverticulitis   . Diverticulosis   . Gastroparesis   . GERD (gastroesophageal reflux disease)   . Hemorrhoids   . Hyperlipidemia   . Hypertension   . Hypertension   . IBS (irritable bowel syndrome)   . Rectal ulcer   . Segmental colitis ([place])   . Tubular adenoma of colon 05/2009    Patient Active Problem List   Diagnosis Date Noted  . Segmental colitis without complication ([place]) [id number]  . Diverticulosis of colon without hemorrhage 02/04/2015  . Unspecified  vitamin D deficiency 12/25/2013  . 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 Atherosclerosis 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 04/20/2011  . SPINAL STENOSIS, LUMBAR 03/09/2010  . OTHER OSTEOPOROSIS 03/09/2010  . SCOLIOSIS, LUMBAR SPINE 03/09/2010  . MIGRAINE, OPHTHALMIC 10/29/2009  . LUMBAR RADICULOPATHY, ATYPICAL 10/29/2009  . POLYNEUROPATHY OTHER DISEASES CLASSIFIED ELSW 09/22/2009  . UNSPECIFIED PERIPHERAL VASCULAR DISEASE 09/22/2009  . ADENOMATOUS COLONIC POLYP 06/10/2009  . ANEMIA, IRON DEFICIENCY 05/06/2009  . CAROTID ARTERY DISEASE 07/28/2008  . GASTROPARESIS 07/22/2008  . ALLERGIC RHINITIS CAUSE UNSPECIFIED 03/20/2008  . DYSPHAGIA 03/12/2008  . GANGLION OF TENDON SHEATH 08/17/2007  . Hypothyroidism 11/15/2006  . Hyperlipidemia 11/07/2006  . ADVEF, DRUG/MEDICINAL/BIOLOGICAL SUBST NOS 11/07/2006  . Essential hypertension 10/18/2006  . GERD 10/18/2006  . DIVERTICULOSIS, COLON 10/18/2006    Past Surgical History:  Procedure Laterality Date  . APPENDECTOMY    . ARCUATE KERATECTOMY    . double pallital tori bone removed     . HERNIA REPAIR     umbilical  . KNEE SURGERY Bilateral   . morton's neuroma on rt foot and platar facial release    . MOUTH SURGERY     "bone shaved off roof of mouth"  . SALIVARY GLAND SURGERY     removal  . TONSILLECTOMY      OB History    No data available       Home Medications    Prior to Admission medications   Medication Sig Start Date End Date Taking? Authorizing Provider  acetaminophen (TYLENOL) 500 MG tablet Take 1,000 mg by mouth every 6 (six)  hours as needed for mild pain, moderate pain, fever or headache.   Yes Historical Provider, MD  ALPRAZolam Duanne Moron) 0.25 MG tablet take 1 tablet by mouth at bedtime if needed for anxiety Patient taking differently: takes 0.5 mg by mouth at bedtime 01/29/16  Yes [name], MD  Artificial Tear Solution (GENTEAL TEARS) 0.1-0.2-0.3 % SOLN Place 2 drops into  both eyes at bedtime.   Yes Historical Provider, MD  benzonatate (TESSALON) 100 MG capsule Take 1 capsule (100 mg total) by mouth 3 (three) times daily. Patient taking differently: Take 100 mg by mouth 2 (two) times daily as needed for cough.  01/28/16  Yes Delano Metz, FNP  Calcium Carbonate-Vitamin D (CALCIUM-VITAMIN D) 500-200 MG-UNIT per tablet Take 1 tablet by mouth 2 (two) times daily with a meal.     Yes Historical Provider, MD  cetirizine (ZYRTEC) 10 MG tablet Take 10 mg by mouth daily as needed for allergies.   Yes Historical Provider, MD  Cholecalciferol (VITAMIN D3) 2000 UNITS TABS Take 1 tablet by mouth daily.    Yes Historical Provider, MD  esomeprazole (NEXIUM) 20 MG capsule Take 20 mg by mouth 2 (two) times daily before a meal.   Yes Historical Provider, MD  gabapentin (NEURONTIN) 100 MG capsule take 1 capsule by mouth at bedtime 01/14/16  Yes [name], MD  IFEREX 150 150 MG capsule take 1 capsule by mouth once daily 01/14/16  Yes [name], MD  influenza vac recom quadrivalent (FLUBLOK) 0.5 ML injection Inject 0.5 mLs into the muscle once.   Yes Historical Provider, MD  losartan (COZAAR) 100 MG tablet Take 1 tablet (100 mg total) by mouth daily. 06/12/15  Yes [name], MD  meloxicam (MOBIC) 15 MG tablet Take 1 tablet (15 mg total) by mouth daily. Patient taking differently: Take 15 mg by mouth daily as needed for pain.  10/27/15  Yes [name], MD  metoCLOPramide (REGLAN) 5 MG tablet Take 1 tablet (5 mg total) by mouth 2 (two) times daily with a meal. Patient taking differently: Take 5 mg by mouth every evening.  09/24/15  Yes [name], MD  ondansetron (ZOFRAN ODT) 4 MG disintegrating tablet Take 1 tablet (4 mg total) by mouth every 8 (eight) hours as needed for nausea or vomiting. 07/10/15  Yes [name], MD  ranitidine (ZANTAC) 150 MG tablet Take 300 mg by mouth at bedtime.   Yes Historical Provider, MD  simvastatin (ZOCOR) 20 MG  tablet Take 1 tablet (20 mg total) by mouth at bedtime. Patient taking differently: Take 10 mg by mouth at bedtime.  04/13/15  Yes [name], MD  SYNTHROID 88 MCG tablet take 1 tablet by mouth once daily 01/07/16  Yes [name], MD  vitamin B-12 (CYANOCOBALAMIN) 500 MCG tablet Take 500 mcg by mouth every morning.    Yes Historical Provider, MD  vitamin C (ASCORBIC ACID) 500 MG tablet Take 500 mg by mouth daily.   Yes Historical Provider, MD  vitamin E (VITAMIN E) 400 UNIT capsule Take 400 Units by mouth daily.   Yes Historical Provider, MD  Zoledronic Acid (RECLAST IV) Inject into the vein. Once per year.   Yes Historical Provider, MD  amoxicillin-clavulanate (AUGMENTIN) 875-125 MG tablet Take 1 tablet by mouth every 12 (twelve) hours. 02/23/16   Waynetta Pean, PA-C  Lactobacillus (ACIDOPHILUS PROBIOTIC) 10 MG TABS Take 10 mg by mouth 3 (three) times daily. 02/23/16   Waynetta Pean, PA-C  ranitidine (ZANTAC) 300 MG tablet Take 1 tablet (300 mg total) by mouth at bedtime. Patient not taking: Reported on 02/23/2016 06/27/12   Ricard Dillon, MD    Family History Family History  Problem Relation Age of Onset  . Diabetes Mother   . Heart disease Mother   . Hyperlipidemia Mother   . Hypertension Mother   . Anuerysm Father   . Heart disease Father   . Hypertension Father   . Migraines Daughter   . Colon polyps Sister   . Lung cancer Sister   . Colon polyps Brother   . Diabetes Brother   . Hyperlipidemia Brother   . Hypertension Brother   . Colon polyps Daughter   . Breast cancer Daughter   . Colon cancer Maternal Aunt   . Cancer Daughter     salivary  . Colon cancer Paternal Grandmother   . Heart disease Brother   . Crohn's disease Neg Hx     Social History Social History  Substance Use Topics  . Smoking status: Never Smoker  . Smokeless tobacco: Never Used  . Alcohol use 0.0 oz/week     Comment: rare     Allergies   Levaquin [levofloxacin];  Sulfamethoxazole-trimethoprim; Lactose intolerance (gi); Tramadol hcl; Ciprofloxacin; Pneumococcal vaccine polyvalent; Adhesive [tape]; and Codeine   Review of Systems Review of Systems  Constitutional: Negative for fever.  HENT: Negative for congestion and sore throat.   Eyes: Negative for visual disturbance.  Respiratory: Negative for shortness of breath.   Cardiovascular: Negative for chest pain and palpitations.  Gastrointestinal: Positive for abdominal pain, diarrhea and nausea. Negative for blood in stool and vomiting.  Genitourinary: Negative for difficulty urinating, dysuria, flank pain, frequency, hematuria, pelvic pain and urgency.  Musculoskeletal: Negative for back pain and neck pain.  Skin: Negative for rash.  Neurological: Negative for dizziness, light-headedness and headaches.     Physical Exam Updated Vital Signs BP 152/67   Pulse 81   Temp 97.8 F (36.6 C)   Resp 17   Ht 5\' 3"  (1.6 m)   Wt 58.5 kg   SpO2 91%   BMI 22.85 kg/m   Physical Exam  Constitutional: She is oriented to person, place, and time. She appears well-developed and well-nourished. No distress.  Nontoxic appearing.  HENT:  Head: Normocephalic and atraumatic.  Mouth/Throat: Oropharynx is clear and moist.  Mucous members are slightly dry.  Eyes: Conjunctivae are normal. Pupils are equal, round, and reactive to light. Right eye exhibits no discharge. Left eye exhibits no discharge.  Neck: Neck supple.  Cardiovascular: Normal rate, regular rhythm, normal heart sounds and intact distal pulses.   Pulmonary/Chest: Effort normal and breath sounds normal. No respiratory distress. She has no wheezes. She has no rales.  Abdominal: Soft. Bowel sounds are normal. She exhibits no distension and no mass. There is tenderness. There is no rebound and no guarding.  Abdomen is soft and bowel sounds are present. Patient has mild tenderness to her bilateral lower abdomen. No focal tenderness. No CVA or flank  tenderness.  Musculoskeletal: She exhibits no edema.  No lower extremity edema or tenderness.  Lymphadenopathy:    She has no cervical adenopathy.  Neurological: She is alert and oriented to person, place, and time. Coordination normal.  Skin: Skin is warm and dry. Capillary refill takes less than 2 seconds. No rash noted. She is not diaphoretic. No erythema. No pallor.  Psychiatric: She has a normal mood and affect. Her behavior is normal.  Nursing note  and vitals reviewed.    ED Treatments / Results  Labs (all labs ordered are listed, but only abnormal results are displayed) Labs Reviewed  COMPREHENSIVE METABOLIC PANEL - Abnormal; Notable for the following:       Result Value   Glucose, Bld 116 (*)    Total Protein 6.4 (*)    GFR calc non Af Amer 52 (*)    All other components within normal limits  CBC - Abnormal; Notable for the following:    RBC 3.86 (*)    All other components within normal limits  URINALYSIS, ROUTINE W REFLEX MICROSCOPIC (NOT AT [facility]) - Abnormal; Notable for the following:    APPearance CLOUDY (*)    Leukocytes, UA SMALL (*)    All other components within normal limits  URINE MICROSCOPIC-ADD ON - Abnormal; Notable for the following:    Squamous Epithelial / LPF 6-30 (*)    Bacteria, UA MANY (*)    All other components within normal limits  GASTROINTESTINAL PANEL BY PCR, STOOL (REPLACES STOOL CULTURE)  URINE CULTURE  LIPASE, BLOOD    EKG  EKG Interpretation None       Radiology Ct Abdomen Pelvis W Contrast  Result Date: 02/23/2016 CLINICAL DATA:  Abdominal pain, diarrhea, history of colitis and diverticulitis, hypertension, irritable bowel syndrome, GERD EXAM: CT ABDOMEN AND PELVIS WITH CONTRAST TECHNIQUE: Multidetector CT imaging of the abdomen and pelvis was performed using the standard protocol following bolus administration of intravenous contrast. Sagittal and coronal MPR images reconstructed from axial data set. CONTRAST:  122mL ISOVUE-300  IOPAMIDOL (ISOVUE-300) INJECTION 61%, 63mL ISOVUE-300 IOPAMIDOL (ISOVUE-300) INJECTION 61% COMPARISON:  12/19/2014 FINDINGS: Lower chest: Dependent atelectasis RIGHT lower lobe Hepatobiliary: Hepatic cysts stable. Gallbladder unremarkable. No biliary dilatation. Pancreas: Mild pancreatic atrophy without mass Spleen: Normal appearance Adrenals/Urinary Tract: Small LEFT renal cyst. Adrenal glands, kidneys, ureters, and bladder normal appearance Stomach/Bowel: Appendix not visualized. Stomach and small bowel loops normal appearance. Few scattered diverticula at the sigmoid colon with mild hyperemia of the sigmoid mesocolon and questionable mild sigmoid wall thickening, cannot exclude subtle colitis. Remainder of colon normal appearance. Vascular/Lymphatic: Atherosclerotic calcifications of aorta and coronary arteries. Tortuous abdominal aorta without aneurysm. No adenopathy. Reproductive: Unremarkable uterus and adnexa. Other: No free air free fluid.  No definite hernia. Musculoskeletal: Rotary dextroconvex thoracolumbar scoliosis. Scattered degenerative disc and facet disease changes lumbar spine and thoracic spine. IMPRESSION: Minimal wall thickening of the sigmoid colon with slight hyperemia of the sigmoid mesocolon, cannot exclude subtle colitis. Aortic atherosclerosis and coronary arterial calcification. Hepatic and LEFT renal cyst. No other definite acute intra-abdominal or intrapelvic abnormalities. Electronically Signed   By: Lavonia Dana M.D.   On: 02/23/2016 14:54    Procedures Procedures (including critical care time)  Medications Ordered in ED Medications  iopamidol (ISOVUE-300) 61 % injection (not administered)  iopamidol (ISOVUE-300) 61 % injection (not administered)  sodium chloride 0.9 % bolus 1,000 mL (0 mLs Intravenous Stopped 02/23/16 1431)  ondansetron (ZOFRAN) injection 4 mg (4 mg Intravenous Given 02/23/16 1250)  iopamidol (ISOVUE-300) 61 % injection 15 mL (30 mLs Oral Contrast Given  02/23/16 1400)  iopamidol (ISOVUE-300) 61 % injection 100 mL (100 mLs Intravenous Contrast Given 02/23/16 1426)     Initial Impression / Assessment and Plan / ED Course  I have reviewed the triage vital signs and the nursing notes.  Pertinent labs & imaging results that were available during my care of the patient were reviewed by me and considered in my medical decision making (see  chart for details).  Clinical Course     This  is a 82 y.o. Female who presents to the ED complaining of lower abdominal pain and diarrhea for the past three days. Patient reports she began having watery diarrhea with multiple loose stools starting 3 days ago. She reports multiple loose and watery stools today. No hematochezia. She reports some nausea but no vomiting. She claims the pain diffusely to her lower abdomen. She denies recent antibiotic use. No recent travel. No recent suspicious food intake. No sick contacts. She has a history of diverticulitis, gastroparesis, irritable bowel and segmental colitis. Previous abdominal surgical history includes an appendectomy. She reports she's been able to tolerate by mouth. Patient denies fevers, urinary symptoms. On exam the patient is afebrile nontoxic appearing. Her abdomen is soft and she has mild bilateral lower abdominal tenderness to palpation. No peritoneal signs. Urinalysis is nitrite negative with small leukocytes. Urine sent for culture. Patient without urinary symptoms. Lipase is within normal limits. CMP is unremarkable. CBC is unremarkable. During her emergency department visit the patient had a formed bowel movement and provided a stool sample. She's had no diarrhea while in the emergency department. CT abdomen and pelvis with contrast was obtained. It showed minimal wall thickening of the sigmoid colon with slight hyperemia of the sigmoid mesocolon. Cannot exclude subtle colitis. We'll cover the patient for colitis. Patient has multiple allergies to  antibiotics. Will use Augmentin. I also encouraged her to use a probiotic. Patient has a gastroenterologist Dr. Fuller Plan. I encouraged her to call and make a follow-up appointment with her gastroenterologist and her primary care doctor. Patient is tolerating by mouth prior to discharge. I encouraged her to push oral fluids. I discussed strict and specific return precautions. I advised the patient to follow-up with their primary care provider this week. I advised the patient to return to the emergency department with new or worsening symptoms or new concerns. The patient verbalized understanding and agreement with plan.    This patient was discussed with and evaluated by Dr. Regenia Skeeter who agrees with assessment and plan.   Final Clinical Impressions(s) / ED Diagnoses   Final diagnoses:  Colitis    New Prescriptions New Prescriptions   AMOXICILLIN-CLAVULANATE (AUGMENTIN) 875-125 MG TABLET    Take 1 tablet by mouth every 12 (twelve) hours.   LACTOBACILLUS (ACIDOPHILUS PROBIOTIC) 10 MG TABS    Take 10 mg by mouth 3 (three) times daily.     Waynetta Pean, PA-C 02/23/16 [place], MD 02/28/16 2201

## 2016-02-23 NOTE — ED Notes (Signed)
Pt ambulated to the restroom with standby assistance and a steady gait.

## 2016-02-23 NOTE — ED Notes (Signed)
Patient unable to urinate at this time. 

## 2016-02-23 NOTE — Telephone Encounter (Signed)
Agree with advice given of ER evaluation.

## 2016-02-23 NOTE — ED Triage Notes (Signed)
Patient states that since Sunday patient having lower abd pain and having diarrhea.

## 2016-02-23 NOTE — Telephone Encounter (Signed)
Patient Name: Rebecca [name] DOB: Jun 24, 1932 Initial Comment She has been having bowel movements that are very liquid that makes her stomach hurt and she can't control it. This started happening on Sunday. Nurse Assessment Nurse: Andria Frames, RN, Aeriel Date/Time (Eastern Time): 02/23/2016 9:35:39 AM Confirm and document reason for call. If symptomatic, describe symptoms. You must click the next button to save text entered. ---Caller states, she is having very loose watery stools. She has to wear a maxi pad because of it. Her stomach hurts and she feels like she is a little sick. Caller unsure of fever. No symptoms of fever. Yesterday afternoon she had a normal bowel movement and was feeling better and then she woke up this morning and is having it again. Does the patient have any new or worsening symptoms? ---Yes Will a triage be completed? ---Yes Related visit to physician within the last 2 weeks? ---No Does the PT have any chronic conditions? (i.e. diabetes, asthma, etc.) ---No Is this a behavioral health or substance abuse call? ---No Guidelines Guideline Title Affirmed Question Affirmed Notes Diarrhea [1] SEVERE abdominal pain (e.g., excruciating) AND [2] present > 1 hour Final Disposition User Go to ED Now Hensel, RN, Aeriel Referrals [facility] - ED Disagree/Comply: Comply

## 2016-02-25 ENCOUNTER — Telehealth: Payer: Self-pay | Admitting: Internal Medicine

## 2016-02-25 LAB — URINE CULTURE

## 2016-02-25 MED ORDER — METRONIDAZOLE 500 MG PO TABS
500.0000 mg | ORAL_TABLET | Freq: Three times a day (TID) | ORAL | 0 refills | Status: DC
Start: 2016-02-25 — End: 2016-03-02

## 2016-02-25 NOTE — Telephone Encounter (Signed)
Call from team health and patient that she is experiencing major diarrhea from her augmentin which was started from bowel colitis diagnosed 2 days ago. Advised her to use imodium and will switch to flagyl for remainder of time. Prior to onset of diarrhea this morning her symptoms were 90% improved on Wednesday. Given her other allergies this is the safest alternative and she is already halfway through her treatment. She is eating and drinking well. If unable to tolerate PO she will seek emergent care.

## 2016-03-01 DIAGNOSIS — H401111 Primary open-angle glaucoma, right eye, mild stage: Secondary | ICD-10-CM | POA: Diagnosis not present

## 2016-03-01 DIAGNOSIS — H401122 Primary open-angle glaucoma, left eye, moderate stage: Secondary | ICD-10-CM | POA: Diagnosis not present

## 2016-03-02 ENCOUNTER — Ambulatory Visit (INDEPENDENT_AMBULATORY_CARE_PROVIDER_SITE_OTHER): Payer: Medicare Other | Admitting: Family Medicine

## 2016-03-02 ENCOUNTER — Encounter: Payer: Self-pay | Admitting: Family Medicine

## 2016-03-02 VITALS — BP 130/78 | HR 79 | Temp 98.5°F | Ht 63.0 in | Wt 129.3 lb

## 2016-03-02 DIAGNOSIS — R1030 Lower abdominal pain, unspecified: Secondary | ICD-10-CM

## 2016-03-02 DIAGNOSIS — R197 Diarrhea, unspecified: Secondary | ICD-10-CM

## 2016-03-02 DIAGNOSIS — I6523 Occlusion and stenosis of bilateral carotid arteries: Secondary | ICD-10-CM

## 2016-03-02 NOTE — Progress Notes (Signed)
Subjective:     Patient ID: Rebecca [name], female   DOB: Aug 03, 1932, 82 y.o.   MRN: [id number]  HPI Patient seen for ER follow-up. She was seen in emergency department 11/21 with lower abdominal pain and diarrhea of 3 days' duration. No reported fever. She had some mild nausea but no vomiting. She did not take any recent antibiotics or recent travels. Reported history of diverticulitis as well as irritable bowel and history of segmental colitis. Previous appendectomy. Patient was afebrile on admission to the ED and nontoxic in appearance.  She had some nonspecific mild bilateral lower abdominal tenderness. Labs revealed normal white count. She had small leukocytes on her urine dipstick. CT abdomen and pelvis revealed minimal wall thickening of the sigmoid colon with slight hyperemia of the sigmoid mesocolon.  Could not exclude subtle colitis. No evidence for perforation. Patient had reported previous intolerance with quinolones. She was placed on Augmentin and after taking this for a few days had severe diarrhea. On call doctor switched her to Flagyl 500 3 times a day and after few days her diarrhea and abdominal pain had fully resolved. She's had no recurrence since then. No fever. Appetite returning to normal. No bloody stools.  Past Medical History:  Diagnosis Date  . Anemia   . Anxiety   . Diverticulitis   . Diverticulosis   . Gastroparesis   . GERD (gastroesophageal reflux disease)   . Glaucoma   . Hemorrhoids   . Hyperlipidemia   . Hypertension   . Hypertension   . IBS (irritable bowel syndrome)   . Rectal ulcer   . Segmental colitis ([place])   . Tubular adenoma of colon 05/2009   Past Surgical History:  Procedure Laterality Date  . APPENDECTOMY    . ARCUATE KERATECTOMY    . double pallital tori bone removed     . HERNIA REPAIR     umbilical  . KNEE SURGERY Bilateral   . morton's neuroma on rt foot and platar facial release    . MOUTH SURGERY     "bone shaved off roof of  mouth"  . SALIVARY GLAND SURGERY     removal  . TONSILLECTOMY      reports that she has never smoked. She has never used smokeless tobacco. She reports that she drinks alcohol. She reports that she does not use drugs. family history includes Anuerysm in her father; Breast cancer in her daughter; Cancer in her daughter; Colon cancer in her maternal aunt and paternal grandmother; Colon polyps in her brother, daughter, and sister; Diabetes in her brother and mother; Heart disease in her brother, father, and mother; Hyperlipidemia in her brother and mother; Hypertension in her brother, father, and mother; Lung cancer in her sister; Migraines in her daughter. Allergies  Allergen Reactions  . Levaquin [Levofloxacin] Other (See Comments)    Very nauseous and shakey  . Sulfamethoxazole-Trimethoprim Nausea Only    shakey  . Lactose Intolerance (Gi) Diarrhea and Other (See Comments)    Stomach hurts   . Tramadol Hcl Nausea Only and Palpitations  . Augmentin [Amoxicillin-Pot Clavulanate] Diarrhea  . Ciprofloxacin Nausea Only    REACTION: Thrush, Nausea, Shakey  . Pneumococcal Vaccine Polyvalent     REACTION: RED AND RAISED RASH ON ARM  . Adhesive [Tape] Rash  . Codeine Nausea Only     Review of Systems  Constitutional: Negative for appetite change, chills and fever.  Respiratory: Negative for shortness of breath.   Cardiovascular: Negative for chest pain.  Gastrointestinal:  Negative for abdominal pain, constipation, diarrhea, nausea and vomiting.  Genitourinary: Negative for dysuria.       Objective:   Physical Exam  Constitutional: She appears well-developed and well-nourished.  HENT:  Mouth/Throat: Oropharynx is clear and moist.  Cardiovascular: Normal rate and regular rhythm.   Pulmonary/Chest: Effort normal and breath sounds normal. No respiratory distress. She has no wheezes. She has no rales.  Abdominal: Soft. Bowel sounds are normal. She exhibits no distension and no mass. There  is no tenderness. There is no rebound and no guarding.       Assessment:     Recent lower abdominal pain and diarrhea with possible acute diverticulitis flare. Patient intolerant of multiple antibiotics but eventually improved with Flagyl    Plan:     -High-fiber diet -Follow-up immediately for any fever or recurrent abdominal pain.  [name] MD [facility]

## 2016-03-02 NOTE — Progress Notes (Signed)
Pre visit review using our clinic review tool, if applicable. No additional management support is needed unless otherwise documented below in the visit note. 

## 2016-03-02 NOTE — Patient Instructions (Signed)

## 2016-03-09 DIAGNOSIS — H401122 Primary open-angle glaucoma, left eye, moderate stage: Secondary | ICD-10-CM | POA: Diagnosis not present

## 2016-04-16 ENCOUNTER — Other Ambulatory Visit: Payer: Self-pay | Admitting: Family Medicine

## 2016-04-19 ENCOUNTER — Ambulatory Visit: Payer: Medicare Other | Admitting: Gastroenterology

## 2016-05-02 DIAGNOSIS — D1801 Hemangioma of skin and subcutaneous tissue: Secondary | ICD-10-CM | POA: Diagnosis not present

## 2016-05-02 DIAGNOSIS — L821 Other seborrheic keratosis: Secondary | ICD-10-CM | POA: Diagnosis not present

## 2016-05-02 DIAGNOSIS — Z85828 Personal history of other malignant neoplasm of skin: Secondary | ICD-10-CM | POA: Diagnosis not present

## 2016-05-04 DIAGNOSIS — H40051 Ocular hypertension, right eye: Secondary | ICD-10-CM | POA: Diagnosis not present

## 2016-05-04 DIAGNOSIS — H40011 Open angle with borderline findings, low risk, right eye: Secondary | ICD-10-CM | POA: Diagnosis not present

## 2016-06-10 ENCOUNTER — Ambulatory Visit (INDEPENDENT_AMBULATORY_CARE_PROVIDER_SITE_OTHER): Payer: Medicare Other | Admitting: Family Medicine

## 2016-06-10 ENCOUNTER — Encounter: Payer: Self-pay | Admitting: Family Medicine

## 2016-06-10 VITALS — BP 160/90 | HR 58 | Temp 97.9°F | Wt 132.0 lb

## 2016-06-10 DIAGNOSIS — R300 Dysuria: Secondary | ICD-10-CM | POA: Diagnosis not present

## 2016-06-10 LAB — POCT URINALYSIS DIPSTICK
Bilirubin, UA: NEGATIVE
Blood, UA: NEGATIVE
Glucose, UA: NEGATIVE
Ketones, UA: NEGATIVE
Nitrite, UA: NEGATIVE
Protein, UA: NEGATIVE
Spec Grav, UA: 1.01
Urobilinogen, UA: 0.2
pH, UA: 5

## 2016-06-10 MED ORDER — CEPHALEXIN 500 MG PO CAPS: 500.0000 mg | ORAL_CAPSULE | Freq: Three times a day (TID) | ORAL | 0 refills | Status: DC

## 2016-06-10 NOTE — Progress Notes (Signed)
Pre visit review using our clinic review tool, if applicable. No additional management support is needed unless otherwise documented below in the visit note. 

## 2016-06-10 NOTE — Progress Notes (Signed)
Subjective:     Patient ID: LEVAEH VICE, female   DOB: Dec 13, 1932, 83 y.o.   MRN: [id number]  HPI   Patient seen with one-day history of burning with urination frequency. She states she's had many UTIs in the past. She's not had any fevers or chills. No flank pain. No nausea or vomiting. She has multiple drug allergies or intolerances including quinolones, sulfa, Augmentin. She has taken Keflex without difficulty in the past.  Past Medical History:  Diagnosis Date  . Anemia   . Anxiety   . Diverticulitis   . Diverticulosis   . Gastroparesis   . GERD (gastroesophageal reflux disease)   . Glaucoma   . Hemorrhoids   . Hyperlipidemia   . Hypertension   . Hypertension   . IBS (irritable bowel syndrome)   . Rectal ulcer   . 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 Tubular adenoma of colon 05/2009   Past Surgical History:  Procedure Laterality Date  . APPENDECTOMY    . ARCUATE KERATECTOMY    . double pallital tori bone removed     . HERNIA REPAIR     umbilical  . KNEE SURGERY Bilateral   . morton's neuroma on rt foot and platar facial release    . MOUTH SURGERY     "bone shaved off roof of mouth"  . SALIVARY GLAND SURGERY     removal  . TONSILLECTOMY      reports that she has never smoked. She has never used smokeless tobacco. She reports that she drinks alcohol. She reports that she does not use drugs. family history includes Anuerysm in her father; Breast cancer in her daughter; Cancer in her daughter; Colon cancer in her maternal aunt and paternal grandmother; Colon polyps in her brother, daughter, and sister; Diabetes in her brother and mother; Heart disease in her brother, father, and mother; Hyperlipidemia in her brother and mother; Hypertension in her brother, father, and mother; Lung cancer in her sister; Migraines in her daughter. Allergies  Allergen Reactions  . Levaquin [Levofloxacin] Other (See Comments)    Very nauseous and shakey  . Sulfamethoxazole-Trimethoprim Nausea  Only    shakey  . Lactose Intolerance (Gi) Diarrhea and Other (See Comments)    Stomach hurts   . Tramadol Hcl Nausea Only and Palpitations  . Augmentin [Amoxicillin-Pot Clavulanate] Diarrhea  . Ciprofloxacin Nausea Only    REACTION: Thrush, Nausea, Shakey  . Pneumococcal Vaccine Polyvalent     REACTION: RED AND RAISED RASH ON ARM  . Adhesive [Tape] Rash  . Codeine Nausea Only     Review of Systems  Constitutional: Negative for appetite change, chills and fever.  Gastrointestinal: Negative for abdominal pain, constipation, diarrhea, nausea and vomiting.  Genitourinary: Positive for dysuria and frequency.  Musculoskeletal: Negative for back pain.  Neurological: Negative for dizziness.       Objective:   Physical Exam  Constitutional: She appears well-developed and well-nourished.  HENT:  Head: Normocephalic and atraumatic.  Neck: Neck supple. No thyromegaly present.  Cardiovascular: Normal rate, regular rhythm and normal heart sounds.   Pulmonary/Chest: Breath sounds normal.  Abdominal: Soft. Bowel sounds are normal. There is no tenderness.       Assessment:     Dysuria-rule out UTI    Plan:     -Urine culture sent -Start Keflex 500 mg 3 times a day for 7 days pending culture results  [name] MD [facility] at [facility]

## 2016-06-10 NOTE — Patient Instructions (Signed)

## 2016-06-12 ENCOUNTER — Encounter: Payer: Self-pay | Admitting: Family Medicine

## 2016-06-12 LAB — URINE CULTURE: Organism ID, Bacteria: NO GROWTH

## 2016-06-16 ENCOUNTER — Other Ambulatory Visit: Payer: Self-pay | Admitting: Family Medicine

## 2016-06-16 DIAGNOSIS — I6523 Occlusion and stenosis of bilateral carotid arteries: Secondary | ICD-10-CM

## 2016-06-20 ENCOUNTER — Ambulatory Visit (HOSPITAL_COMMUNITY)
Admission: RE | Admit: 2016-06-20 | Discharge: 2016-06-20 | Disposition: A | Discharge status: Home / Self Care | Payer: Medicare Other | Source: Ambulatory Visit | Attending: Cardiovascular Disease | Admitting: Cardiovascular Disease

## 2016-06-20 ENCOUNTER — Other Ambulatory Visit: Payer: Self-pay | Admitting: Family Medicine

## 2016-06-20 DIAGNOSIS — I6523 Occlusion and stenosis of bilateral carotid arteries: Secondary | ICD-10-CM | POA: Diagnosis not present

## 2016-06-27 ENCOUNTER — Encounter: Payer: Self-pay | Admitting: Family Medicine

## 2016-07-02 ENCOUNTER — Ambulatory Visit (INDEPENDENT_AMBULATORY_CARE_PROVIDER_SITE_OTHER): Payer: Medicare Other | Admitting: Family Medicine

## 2016-07-02 ENCOUNTER — Other Ambulatory Visit (HOSPITAL_COMMUNITY)
Admission: RE | Admit: 2016-07-02 | Discharge: 2016-07-02 | Disposition: A | Discharge status: Home / Self Care | Payer: Medicare Other | Source: Other Acute Inpatient Hospital | Attending: Family Medicine | Admitting: Family Medicine

## 2016-07-02 ENCOUNTER — Telehealth: Payer: Self-pay | Admitting: Family Medicine

## 2016-07-02 ENCOUNTER — Encounter: Payer: Self-pay | Admitting: Family Medicine

## 2016-07-02 VITALS — BP 120/82 | HR 76 | Temp 97.5°F | Resp 14 | Ht 63.0 in | Wt 131.0 lb

## 2016-07-02 DIAGNOSIS — R300 Dysuria: Secondary | ICD-10-CM | POA: Diagnosis not present

## 2016-07-02 DIAGNOSIS — I6523 Occlusion and stenosis of bilateral carotid arteries: Secondary | ICD-10-CM | POA: Diagnosis not present

## 2016-07-02 LAB — POCT URINALYSIS DIP (MANUAL ENTRY)
Bilirubin, UA: NEGATIVE
Blood, UA: NEGATIVE
Glucose, UA: NEGATIVE
Ketones, POC UA: NEGATIVE
Nitrite, UA: POSITIVE — AB
Protein Ur, POC: NEGATIVE
Spec Grav, UA: 1.015 (ref 1.030–1.035)
Urobilinogen, UA: 2 — AB (ref ?–2.0)
pH, UA: 6 (ref 5.0–8.0)

## 2016-07-02 MED ORDER — CEPHALEXIN 500 MG PO CAPS: 500.0000 mg | ORAL_CAPSULE | Freq: Two times a day (BID) | ORAL | 0 refills | Status: DC

## 2016-07-02 NOTE — Progress Notes (Signed)
[place] at [facility] [address], [place], [place] [address] [phone]  Date:  07/02/2016   Name:  JAZYIAH YIU   DOB:  [birth year]   MRN:  [id number]  PCP:  [name], MD    Chief Complaint: Dysuria (and lower abdominal pain x yesterday )   History of Present Illness:  KATTIE SANTOYO is a 83 y.o. very pleasant female patient who presents with the following:  She is here today with suspected UTI- she noted onset of sx the day before yesterday.  Yesterday she noted a feeling of discomfort in lower abd which is typical of a UTI for her. This prompted her to decide to come in.  She has not really noted any urinary frequency, urgency or dysuria.  No hematuria noted.  She is taking azo now however which has helped with her sx  She was treated for a UTI on 3/8 with keflex.  She notes that she had the same sx at that time and that she did get back to her baseline between then and now.  However on review her urine culture was actually negative at that time   She has not noted any nausea, vomiting or diarrhea Her bowels are normal No vaginal sx  No rash, cough, ST  Patient Active Problem List   Diagnosis Date Noted  . 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 Diverticulosis of colon without hemorrhage 02/04/2015  . Unspecified vitamin D deficiency 12/25/2013  . 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 Atherosclerosis 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 04/20/2011  . SPINAL STENOSIS, LUMBAR 03/09/2010  . OTHER OSTEOPOROSIS 03/09/2010  . SCOLIOSIS, LUMBAR SPINE 03/09/2010  . MIGRAINE, OPHTHALMIC 10/29/2009  . LUMBAR RADICULOPATHY, ATYPICAL 10/29/2009  . POLYNEUROPATHY OTHER DISEASES CLASSIFIED ELSW 09/22/2009  . UNSPECIFIED PERIPHERAL VASCULAR DISEASE 09/22/2009  . ADENOMATOUS COLONIC POLYP 06/10/2009  . ANEMIA, IRON DEFICIENCY 05/06/2009  . CAROTID ARTERY DISEASE  07/28/2008  . GASTROPARESIS 07/22/2008  . ALLERGIC RHINITIS CAUSE UNSPECIFIED 03/20/2008  . DYSPHAGIA 03/12/2008  . GANGLION OF TENDON SHEATH 08/17/2007  . Hypothyroidism 11/15/2006  . Hyperlipidemia 11/07/2006  . ADVEF, DRUG/MEDICINAL/BIOLOGICAL SUBST NOS 11/07/2006  . Essential hypertension 10/18/2006  . GERD 10/18/2006  . DIVERTICULOSIS, COLON 10/18/2006    Past Medical History:  Diagnosis Date  . Anemia   . Anxiety   . Diverticulitis   . Diverticulosis   . Gastroparesis   . GERD (gastroesophageal reflux disease)   . Glaucoma   . Hemorrhoids   . Hyperlipidemia   . Hypertension   . Hypertension   . IBS (irritable bowel syndrome)   . Rectal ulcer   . Segmental colitis ([place])   . Tubular adenoma of colon 05/2009    Past Surgical History:  Procedure Laterality Date  . APPENDECTOMY    . ARCUATE KERATECTOMY    . double pallital tori bone removed     . HERNIA REPAIR     umbilical  . KNEE SURGERY Bilateral   . morton's neuroma on rt foot and platar facial release    . MOUTH SURGERY     "bone shaved off roof of mouth"  . SALIVARY GLAND SURGERY     removal  . TONSILLECTOMY      Social History  Substance Use Topics  . Smoking status: Never Smoker  . Smokeless tobacco: Never Used  . Alcohol use 0.0 oz/week     Comment: rare    Family  History  Problem Relation Age of Onset  . Diabetes Mother   . Heart disease Mother   . Hyperlipidemia Mother   . Hypertension Mother   . Anuerysm Father   . Heart disease Father   . Hypertension Father   . Migraines Daughter   . Colon polyps Sister   . Lung cancer Sister   . Colon polyps Brother   . Diabetes Brother   . Hyperlipidemia Brother   . Hypertension Brother   . Colon polyps Daughter   . Breast cancer Daughter   . Colon cancer Maternal Aunt   . Cancer Daughter     salivary  . Colon cancer Paternal Grandmother   . Heart disease Brother   . Crohn's disease Neg Hx     Allergies  Allergen Reactions  . Levaquin  [Levofloxacin] Other (See Comments)    Very nauseous and shakey  . Sulfamethoxazole-Trimethoprim Nausea Only    shakey  . Lactose Intolerance (Gi) Diarrhea and Other (See Comments)    Stomach hurts   . Tramadol Hcl Nausea Only and Palpitations  . Augmentin [Amoxicillin-Pot Clavulanate] Diarrhea  . Ciprofloxacin Nausea Only    REACTION: Thrush, Nausea, Shakey  . Pneumococcal Vaccine Polyvalent     REACTION: RED AND RAISED RASH ON ARM  . Adhesive [Tape] Rash  . Codeine Nausea Only    Medication list has been reviewed and updated.  Current Outpatient Prescriptions on File Prior to Visit  Medication Sig Dispense Refill  . acetaminophen (TYLENOL) 500 MG tablet Take 1,000 mg by mouth every 6 (six) hours as needed for mild pain, moderate pain, fever or headache.    . ALPRAZolam (XANAX) 0.25 MG tablet take 1 tablet by mouth at bedtime if needed for anxiety (Patient taking differently: takes 0.5 mg by mouth at bedtime) 30 tablet 5  . Artificial Tear Solution (GENTEAL TEARS) 0.1-0.2-0.3 % SOLN Place 2 drops into both eyes at bedtime.    . Calcium Carbonate-Vitamin D (CALCIUM-VITAMIN D) 500-200 MG-UNIT per tablet Take 1 tablet by mouth 2 (two) times daily with a meal.      . cetirizine (ZYRTEC) 10 MG tablet Take 10 mg by mouth daily as needed for allergies.    . Cholecalciferol (VITAMIN D3) 2000 UNITS TABS Take 1 tablet by mouth daily.     [name] Kitchen esomeprazole (NEXIUM) 20 MG capsule Take 20 mg by mouth 2 (two) times daily before a meal.    . gabapentin (NEURONTIN) 100 MG capsule take 1 capsule by mouth at bedtime 90 capsule 1  . influenza vac recom quadrivalent (FLUBLOK) 0.5 ML injection Inject 0.5 mLs into the muscle once.    . Lactobacillus (ACIDOPHILUS PROBIOTIC) 10 MG TABS Take 10 mg by mouth 3 (three) times daily. 30 tablet 0  . losartan (COZAAR) 100 MG tablet Take 1 tablet (100 mg total) by mouth daily. 90 tablet 2  . meloxicam (MOBIC) 15 MG tablet Take 1 tablet (15 mg total) by mouth daily.  (Patient taking differently: Take 15 mg by mouth daily as needed for pain. ) 30 tablet 1  . metoCLOPramide (REGLAN) 5 MG tablet Take 1 tablet (5 mg total) by mouth 2 (two) times daily with a meal. (Patient taking differently: Take 5 mg by mouth every evening. ) 60 tablet 5  . ondansetron (ZOFRAN ODT) 4 MG disintegrating tablet Take 1 tablet (4 mg total) by mouth every 8 (eight) hours as needed for nausea or vomiting. 15 tablet 0  . POLY-IRON 150 150 MG capsule take 1  capsule by mouth once daily 30 capsule 3  . ranitidine (ZANTAC) 150 MG tablet Take 300 mg by mouth at bedtime.    . ranitidine (ZANTAC) 300 MG tablet Take 1 tablet (300 mg total) by mouth at bedtime. 30 tablet 11  . simvastatin (ZOCOR) 20 MG tablet take 1 tablet by mouth at bedtime 90 tablet 3  . SYNTHROID 88 MCG tablet take 1 tablet by mouth once daily 30 tablet 5  . vitamin B-12 (CYANOCOBALAMIN) 500 MCG tablet Take 500 mcg by mouth every morning.     . vitamin C (ASCORBIC ACID) 500 MG tablet Take 500 mg by mouth daily.    . vitamin E (VITAMIN E) 400 UNIT capsule Take 400 Units by mouth daily.    . Zoledronic Acid (RECLAST IV) Inject into the vein. Once per year.    . benzonatate (TESSALON) 100 MG capsule Take 1 capsule (100 mg total) by mouth 3 (three) times daily. (Patient not taking: Reported on 07/02/2016) 20 capsule 0  . [DISCONTINUED] olmesartan (BENICAR) 20 MG tablet Take 20 mg by mouth daily.       No current facility-administered medications on file prior to visit.     Review of Systems:  As per HPI- otherwise negative.   Physical Examination: Vitals:   07/02/16 0958  BP: 120/82  Pulse: 76  Resp: 14  Temp: 97.5 F (36.4 C)   Vitals:   07/02/16 0958  Weight: 131 lb (59.4 kg)  Height: 5\' 3"  (1.6 m)   Body mass index is 23.21 kg/m. Ideal Body Weight: Weight in (lb) to have BMI = 25: 140.8  GEN: WDWN, NAD, Non-toxic, A & O x 3, older lady who looks well HEENT: Atraumatic, Normocephalic. Neck supple. No  masses, No LAD. Ears and Nose: No external deformity. CV: RRR, No M/G/R. No JVD. No thrill. No extra heart sounds. PULM: CTA B, no wheezes, crackles, rhonchi. No retractions. No resp. distress. No accessory muscle use. ABD: S, NT, ND, +BS. No rebound. No HSM. EXTR: No c/c/e NEURO Normal gait.  PSYCH: Normally interactive. Conversant. Not depressed or anxious appearing.  Calm demeanor.  No cva tenderness Belly is quite benign  Results for orders placed or performed in visit on 07/02/16  POCT urinalysis dipstick  Result Value Ref Range   Color, UA orange (A) yellow   Clarity, UA cloudy (A) clear   Glucose, UA negative negative   Bilirubin, UA negative negative   Ketones, POC UA negative negative   Spec Grav, UA 1.015 1.030 - 1.035   Blood, UA negative negative   pH, UA 6.0 5.0 - 8.0   Protein Ur, POC negative negative   Urobilinogen, UA 2.0 (A) Negative - 2.0   Nitrite, UA Positive (A) Negative   Leukocytes, UA moderate (2+) (A) Negative     Assessment and Plan: Dysuria - Plan: POCT urinalysis dipstick, Urine culture, cephALEXin (KEFLEX) 500 MG capsule  Here today with concern of UTI.  Her main sx is a feeling of lower abd discomfort.  Her urine is positive for leukocytes and nitrites, but she has been using azo.  Her exam and belly are benign.  Will start her on keflex and check urine culture Advised that if her culture is again negative there may be something else causing her sx that will need evaluation.  She states understanding  She will seek advice if her sx do not remit in the next 1-2 days- Sooner if worse.    Signed Lamar Blinks, MD

## 2016-07-02 NOTE — Telephone Encounter (Signed)
[facility] - Client TELEPHONE [facility] Patient Name: Rebecca [name] DOB: 1932/06/20 Initial Comment Caller States she has a UTI, would like to be seen at [place] office today if possible Nurse Assessment Nurse: Dimas Chyle, RN, Dellis Filbert Date/Time (Eastern Time): 07/02/2016 8:43:09 AM Confirm and document reason for call. If symptomatic, describe symptoms. ---Caller States she has a UTI, would like to be seen at [place] office today if possible. Symptoms started on Friday. Increased frequency and burning. No fever. No flank pain. Lower abdominal pain. No blood in urine. Does the patient have any new or worsening symptoms? ---Yes Will a triage be completed? ---Yes Related visit to physician within the last 2 weeks? ---No Does the PT have any chronic conditions? (i.e. diabetes, asthma, etc.) ---Yes List chronic conditions. ---HTN Is this a behavioral health or substance abuse call? ---No Guidelines Guideline Title Affirmed Question Affirmed Notes Urinary Symptoms Urinating more frequently than usual (i.e., frequency) Final Disposition User See Physician within [place], RN, [facility] Referrals [facility] Disagree/Comply: Comply

## 2016-07-02 NOTE — Progress Notes (Signed)
Pre visit review using our clinic review tool, if applicable. No additional management support is needed unless otherwise documented below in the visit note. 

## 2016-07-02 NOTE — Patient Instructions (Signed)
We are going to send your urine for a culture and I will let you know what it shows.  For the time being we will treat you with keflex for a presumed UTI; take your keflex twice a day for 7 days.  Let me know if you are getting worse or have any other symptoms such as fever or back pain.

## 2016-07-03 ENCOUNTER — Other Ambulatory Visit: Payer: Self-pay | Admitting: Family Medicine

## 2016-07-03 LAB — URINE CULTURE: Culture: NO GROWTH

## 2016-07-04 ENCOUNTER — Encounter: Payer: Self-pay | Admitting: Family Medicine

## 2016-07-04 NOTE — Telephone Encounter (Signed)
[facility] seen at [facility]. Nothing further needed at this time.

## 2016-07-08 DIAGNOSIS — R300 Dysuria: Secondary | ICD-10-CM | POA: Diagnosis not present

## 2016-07-08 DIAGNOSIS — N3020 Other chronic cystitis without hematuria: Secondary | ICD-10-CM | POA: Diagnosis not present

## 2016-07-12 ENCOUNTER — Encounter: Payer: Self-pay | Admitting: Family Medicine

## 2016-07-12 ENCOUNTER — Ambulatory Visit (INDEPENDENT_AMBULATORY_CARE_PROVIDER_SITE_OTHER): Payer: Medicare Other | Admitting: Family Medicine

## 2016-07-12 VITALS — BP 120/70 | HR 104 | Temp 98.5°F | Wt 132.2 lb

## 2016-07-12 DIAGNOSIS — R1032 Left lower quadrant pain: Secondary | ICD-10-CM

## 2016-07-12 DIAGNOSIS — I6523 Occlusion and stenosis of bilateral carotid arteries: Secondary | ICD-10-CM | POA: Diagnosis not present

## 2016-07-12 LAB — CBC WITH DIFFERENTIAL/PLATELET
Basophils Absolute: 0.1 10*3/uL (ref 0.0–0.1)
Basophils Relative: 0.7 % (ref 0.0–3.0)
Eosinophils Absolute: 0.2 10*3/uL (ref 0.0–0.7)
Eosinophils Relative: 1.7 % (ref 0.0–5.0)
HCT: 35.9 % — ABNORMAL LOW (ref 36.0–46.0)
Hemoglobin: 12.2 g/dL (ref 12.0–15.0)
Lymphocytes Relative: 47.7 % — ABNORMAL HIGH (ref 12.0–46.0)
Lymphs Abs: 4.5 10*3/uL — ABNORMAL HIGH (ref 0.7–4.0)
MCHC: 34 g/dL (ref 30.0–36.0)
MCV: 97.9 fl (ref 78.0–100.0)
Monocytes Absolute: 0.9 10*3/uL (ref 0.1–1.0)
Monocytes Relative: 10 % (ref 3.0–12.0)
Neutro Abs: 3.7 10*3/uL (ref 1.4–7.7)
Neutrophils Relative %: 39.9 % — ABNORMAL LOW (ref 43.0–77.0)
Platelets: 247 10*3/uL (ref 150.0–400.0)
RBC: 3.66 Mil/uL — ABNORMAL LOW (ref 3.87–5.11)
RDW: 13.1 % (ref 11.5–15.5)
WBC: 9.3 10*3/uL (ref 4.0–10.5)

## 2016-07-12 MED ORDER — METRONIDAZOLE 500 MG PO TABS: 500.0000 mg | ORAL_TABLET | Freq: Two times a day (BID) | ORAL | 0 refills | Status: DC

## 2016-07-12 MED ORDER — AMOXICILLIN-POT CLAVULANATE 875-125 MG PO TABS: 1.0000 | ORAL_TABLET | Freq: Two times a day (BID) | ORAL | 0 refills | Status: DC

## 2016-07-12 NOTE — Patient Instructions (Signed)

## 2016-07-12 NOTE — Progress Notes (Signed)
Subjective:     Patient ID: Rebecca [name], female   DOB: [birth year], 83 y.o.   MRN: [id number]  HPI Patient seen with almost 2 week history of mostly left lower quadrant and somewhat mid lower abdominal pain. She had CT scanning last November for similar symptoms with subtle colitis sigmoid area but no acute changes otherwise. She had colonoscopy September 2016 with question of sigmoid diverticulitis and was treated at that time with Augmentin and Flagyl. She has allergies to Cipro. Pain is relatively constant and she describes this as achy quality moderate severity.  Complicating things is that she has history of reported IBS. She has alternating constipation and diarrhea. No recent bloody stools. No fevers or chills. No nausea or vomiting.  Recent urine culture was negative. She has no consistent burning with urination. She saw urologist recently who placed her on Premarin cream. She has scheduled follow-up with gynecologist in 2 days.  Patient requesting new shingles vaccine vaccine  Past Medical History:  Diagnosis Date  . Anemia   . Anxiety   . Diverticulitis   . Diverticulosis   . Gastroparesis   . GERD (gastroesophageal reflux disease)   . Glaucoma   . Hemorrhoids   . Hyperlipidemia   . Hypertension   . Hypertension   . IBS (irritable bowel syndrome)   . Rectal ulcer   . Segmental colitis ([place])   . Tubular adenoma of colon 05/2009   Past Surgical History:  Procedure Laterality Date  . APPENDECTOMY    . ARCUATE KERATECTOMY    . double pallital tori bone removed     . HERNIA REPAIR     umbilical  . KNEE SURGERY Bilateral   . morton's neuroma on rt foot and platar facial release    . MOUTH SURGERY     "bone shaved off roof of mouth"  . SALIVARY GLAND SURGERY     removal  . TONSILLECTOMY      reports that she has never smoked. She has never used smokeless tobacco. She reports that she drinks alcohol. She reports that she does not use drugs. family history includes  Anuerysm in her father; Breast cancer in her daughter; Cancer in her daughter; Colon cancer in her maternal aunt and paternal grandmother; Colon polyps in her brother, daughter, and sister; Diabetes in her brother and mother; Heart disease in her brother, father, and mother; Hyperlipidemia in her brother and mother; Hypertension in her brother, father, and mother; Lung cancer in her sister; Migraines in her daughter. Allergies  Allergen Reactions  . Levaquin [Levofloxacin] Other (See Comments)    Very nauseous and shakey  . Sulfamethoxazole-Trimethoprim Nausea Only    shakey  . Lactose Intolerance (Gi) Diarrhea and Other (See Comments)    Stomach hurts   . Tramadol Hcl Nausea Only and Palpitations  . Augmentin [Amoxicillin-Pot Clavulanate] Diarrhea  . Ciprofloxacin Nausea Only    REACTION: Thrush, Nausea, Shakey  . Pneumococcal Vaccine Polyvalent     REACTION: RED AND RAISED RASH ON ARM  . Adhesive [Tape] Rash  . Codeine Nausea Only      Review of Systems  Constitutional: Negative for chills and fever.  Respiratory: Negative for shortness of breath.   Cardiovascular: Negative for chest pain.  Gastrointestinal: Positive for abdominal pain. Negative for nausea and vomiting.  Genitourinary: Negative for dysuria and pelvic pain.  Musculoskeletal: Negative for back pain.  Skin: Negative for rash.       Objective:   Physical Exam  Constitutional: She appears  well-developed and well-nourished.  Cardiovascular: Normal rate and regular rhythm.   Pulmonary/Chest: Effort normal and breath sounds normal. No respiratory distress. She has no wheezes. She has no rales.  Abdominal: Soft. Bowel sounds are normal. She exhibits no mass. There is tenderness. There is no rebound and no guarding.       Assessment:     Patient seen with almost 2 week history of some persistent lower abdominal pain mostly left lower quadrant and mid abdomen. She has history of sigmoid diverticulitis in the past  and suspect this may be related to current pain issues. She does have history of IBS which complicates things somewhat. No evidence for acute abdomen at this time    Plan:     -Check CBC with differential -Start Augmentin 875 mg twice daily for 10 days and Flagyl 500 milligrams twice a day for 10 days -Follow-up immediately for any fever, vomiting, or other concerns -Be in touch if abdominal pain not resolving over the next few days -Written prescription given for shingles vaccine with shingrix  [name] MD [facility] at [facility]

## 2016-07-12 NOTE — Progress Notes (Signed)
Pre visit review using our clinic review tool, if applicable. No additional management support is needed unless otherwise documented below in the visit note. 

## 2016-07-13 ENCOUNTER — Other Ambulatory Visit: Payer: Self-pay | Admitting: Family Medicine

## 2016-07-13 NOTE — Telephone Encounter (Signed)
Refills OK. 

## 2016-07-13 NOTE — Telephone Encounter (Signed)
Refill request gabapentin (NEURONTIN) 100 MG capsule Last refill 01/14/16  Last office visit 07/12/16.  Okay to fill?

## 2016-07-14 ENCOUNTER — Telehealth: Payer: Self-pay | Admitting: Family Medicine

## 2016-07-14 NOTE — Telephone Encounter (Signed)
Per pt Augmentin is causing GI upset. She would like to stop taking it. Any recommendations on changing medication?  Dr. Elease Hashimoto - Please advise. Thanks!

## 2016-07-14 NOTE — Telephone Encounter (Signed)
Patient Name: Rebecca [name] DOB: [birth year] Initial Comment Caller States the doctor put her on 2 antibiotics, woke up this morning with diarrhea Nurse Assessment Nurse: Ronnald Ramp, RN, Miranda Date/Time (Eastern Time): 07/14/2016 8:36:16 AM Confirm and document reason for call. If symptomatic, describe symptoms. ---Caller states she has been prescribed 2 antibiotics, Flagyl and Augmentin. She woke up with severe diarrhea. She is wanting to stop taking the Augmentin. Declined triage. Does the patient have any new or worsening symptoms? ---Yes Will a triage be completed? ---No Select reason for no triage. ---Patient declined Please document clinical information provided and list any resource used. ---Triage offered and declined. Caller states she believes the diarrhea is from taking both antibiotics together or from the Augmentin. Told caller caller that i would forward a message to the office and ask that the MD review and call her back. Guidelines Guideline Title Affirmed Question Affirmed Notes Final Disposition User Clinical Call Ronnald Ramp, RN, [employer]

## 2016-07-15 NOTE — Telephone Encounter (Signed)
Spoke with pt and advised. She does not wish to start another abx at this time. This past fall she was given Augmentin, it upset her stomach and she was switched to Flagyl and she improved. She would like to follow this same regimen and only continue the Flagyl. Advised pt to contact office if not continuing to improve. Added Augmentin to her allergy list as intolerance. Nothing further needed at this time.

## 2016-07-15 NOTE — Telephone Encounter (Signed)
Hold the Augmentin.  Continue the Flagyl and start plain Amoxicillin 500 mg po TID for 7 more days. We need to hear back next week if not better.

## 2016-08-12 DIAGNOSIS — M810 Age-related osteoporosis without current pathological fracture: Secondary | ICD-10-CM | POA: Diagnosis not present

## 2016-08-12 DIAGNOSIS — N952 Postmenopausal atrophic vaginitis: Secondary | ICD-10-CM | POA: Diagnosis not present

## 2016-08-12 DIAGNOSIS — L900 Lichen sclerosus et atrophicus: Secondary | ICD-10-CM | POA: Diagnosis not present

## 2016-08-12 DIAGNOSIS — N815 Vaginal enterocele: Secondary | ICD-10-CM | POA: Diagnosis not present

## 2016-08-12 DIAGNOSIS — Z124 Encounter for screening for malignant neoplasm of cervix: Secondary | ICD-10-CM | POA: Diagnosis not present

## 2016-08-18 DIAGNOSIS — N8111 Cystocele, midline: Secondary | ICD-10-CM | POA: Diagnosis not present

## 2016-08-18 DIAGNOSIS — M810 Age-related osteoporosis without current pathological fracture: Secondary | ICD-10-CM | POA: Diagnosis not present

## 2016-08-18 DIAGNOSIS — N952 Postmenopausal atrophic vaginitis: Secondary | ICD-10-CM | POA: Diagnosis not present

## 2016-08-18 DIAGNOSIS — N816 Rectocele: Secondary | ICD-10-CM | POA: Diagnosis not present

## 2016-08-22 ENCOUNTER — Other Ambulatory Visit: Payer: Self-pay | Admitting: Obstetrics

## 2016-08-31 ENCOUNTER — Other Ambulatory Visit: Payer: Self-pay | Admitting: Family Medicine

## 2016-08-31 DIAGNOSIS — N816 Rectocele: Secondary | ICD-10-CM | POA: Diagnosis not present

## 2016-08-31 DIAGNOSIS — N390 Urinary tract infection, site not specified: Secondary | ICD-10-CM | POA: Diagnosis not present

## 2016-08-31 DIAGNOSIS — N8111 Cystocele, midline: Secondary | ICD-10-CM | POA: Diagnosis not present

## 2016-08-31 DIAGNOSIS — R300 Dysuria: Secondary | ICD-10-CM | POA: Diagnosis not present

## 2016-08-31 DIAGNOSIS — N898 Other specified noninflammatory disorders of vagina: Secondary | ICD-10-CM | POA: Diagnosis not present

## 2016-08-31 NOTE — Telephone Encounter (Signed)
Refills OK. 

## 2016-08-31 NOTE — Telephone Encounter (Signed)
Patient last refill was 01/29/16 #30 with 5 refills. Last OV/Lab was 07/2016, please advise if okey to refill.

## 2016-08-31 NOTE — Telephone Encounter (Signed)
Rx done. 

## 2016-09-09 ENCOUNTER — Ambulatory Visit (HOSPITAL_COMMUNITY): Payer: Medicare Other

## 2016-09-14 ENCOUNTER — Ambulatory Visit (HOSPITAL_COMMUNITY)
Admission: RE | Admit: 2016-09-14 | Discharge: 2016-09-14 | Disposition: A | Discharge status: Home / Self Care | Payer: Medicare Other | Source: Ambulatory Visit | Attending: Internal Medicine | Admitting: Internal Medicine

## 2016-09-14 ENCOUNTER — Encounter (HOSPITAL_COMMUNITY): Payer: Self-pay

## 2016-09-14 DIAGNOSIS — M810 Age-related osteoporosis without current pathological fracture: Secondary | ICD-10-CM | POA: Diagnosis not present

## 2016-09-14 MED ORDER — ZOLEDRONIC ACID 5 MG/100ML IV SOLN
5.0000 mg | Freq: Once | INTRAVENOUS | Status: AC
  Administered 2016-09-14: 5 mg via INTRAVENOUS
  Filled 2016-09-14: qty 100

## 2016-09-14 MED ORDER — SODIUM CHLORIDE 0.9 % IV SOLN
Freq: Once | INTRAVENOUS | Status: AC
  Administered 2016-09-14: 12:00:00 via INTRAVENOUS

## 2016-09-14 NOTE — Discharge Instructions (Signed)
Zoledronic Acid injection (Paget's Disease, Osteoporosis) / reclast infusion  °What is this medicine? °ZOLEDRONIC ACID (ZOE le dron ik AS id) lowers the amount of calcium loss from bone. It is used to treat Paget's disease and osteoporosis in women. °This medicine may be used for other purposes; ask your health care provider or pharmacist if you have questions. °COMMON BRAND NAME(S): Reclast, Zometa °What should I tell my health care provider before I take this medicine? °They need to know if you have any of these conditions: °-aspirin-sensitive asthma °-cancer, especially if you are receiving medicines used to treat cancer °-dental disease or wear dentures °-infection °-kidney disease °-low levels of calcium in the blood °-past surgery on the parathyroid gland or intestines °-receiving corticosteroids like dexamethasone or prednisone °-an unusual or allergic reaction to zoledronic acid, other medicines, foods, dyes, or preservatives °-pregnant or trying to get pregnant °-breast-feeding °How should I use this medicine? °This medicine is for infusion into a vein. It is given by a health care professional in a hospital or clinic setting. °Talk to your pediatrician regarding the use of this medicine in children. This medicine is not approved for use in children. °Overdosage: If you think you have taken too much of this medicine contact a poison control center or emergency room at once. °NOTE: This medicine is only for you. Do not share this medicine with others. °What if I miss a dose? °It is important not to miss your dose. Call your doctor or health care professional if you are unable to keep an appointment. °What may interact with this medicine? °-certain antibiotics given by injection °-NSAIDs, medicines for pain and inflammation, like ibuprofen or naproxen °-some diuretics like bumetanide, furosemide °-teriparatide °This list may not describe all possible interactions. Give your health care provider a list of all  the medicines, herbs, non-prescription drugs, or dietary supplements you use. Also tell them if you smoke, drink alcohol, or use illegal drugs. Some items may interact with your medicine. °What should I watch for while using this medicine? °Visit your doctor or health care professional for regular checkups. It may be some time before you see the benefit from this medicine. Do not stop taking your medicine unless your doctor tells you to. Your doctor may order blood tests or other tests to see how you are doing. °Women should inform their doctor if they wish to become pregnant or think they might be pregnant. There is a potential for serious side effects to an unborn child. Talk to your health care professional or pharmacist for more information. °You should make sure that you get enough calcium and vitamin D while you are taking this medicine. Discuss the foods you eat and the vitamins you take with your health care professional. °Some people who take this medicine have severe bone, joint, and/or muscle pain. This medicine may also increase your risk for jaw problems or a broken thigh bone. Tell your doctor right away if you have severe pain in your jaw, bones, joints, or muscles. Tell your doctor if you have any pain that does not go away or that gets worse. °Tell your dentist and dental surgeon that you are taking this medicine. You should not have major dental surgery while on this medicine. See your dentist to have a dental exam and fix any dental problems before starting this medicine. Take good care of your teeth while on this medicine. Make sure you see your dentist for regular follow-up appointments. °What side effects may I notice   from receiving this medicine? °Side effects that you should report to your doctor or health care professional as soon as possible: °-allergic reactions like skin rash, itching or hives, swelling of the face, lips, or tongue °-anxiety, confusion, or depression °-breathing  problems °-changes in vision °-eye pain °-feeling faint or lightheaded, falls °-jaw pain, especially after dental work °-mouth sores °-muscle cramps, stiffness, or weakness °-redness, blistering, peeling or loosening of the skin, including inside the mouth °-trouble passing urine or change in the amount of urine °Side effects that usually do not require medical attention (report to your doctor or health care professional if they continue or are bothersome): °-bone, joint, or muscle pain °-constipation °-diarrhea °-fever °-hair loss °-irritation at site where injected °-loss of appetite °-nausea, vomiting °-stomach upset °-trouble sleeping °-trouble swallowing °-weak or tired °This list may not describe all possible side effects. Call your doctor for medical advice about side effects. You may report side effects to FDA at 1-800-FDA-1088. °Where should I keep my medicine? °This drug is given in a hospital or clinic and will not be stored at home. °NOTE: This sheet is a summary. It may not cover all possible information. If you have questions about this medicine, talk to your doctor, pharmacist, or health care provider. °© 2018 Elsevier/Gold Standard (2013-08-17 14:19:57) ° °

## 2016-09-21 DIAGNOSIS — Z1231 Encounter for screening mammogram for malignant neoplasm of breast: Secondary | ICD-10-CM | POA: Diagnosis not present

## 2016-09-21 DIAGNOSIS — Z803 Family history of malignant neoplasm of breast: Secondary | ICD-10-CM | POA: Diagnosis not present

## 2016-09-23 ENCOUNTER — Encounter: Payer: Self-pay | Admitting: Family Medicine

## 2016-09-23 MED ORDER — GABAPENTIN 100 MG PO CAPS: ORAL_CAPSULE | ORAL | 1 refills | Status: DC

## 2016-10-15 ENCOUNTER — Encounter: Payer: Self-pay | Admitting: Family Medicine

## 2016-10-15 ENCOUNTER — Ambulatory Visit (INDEPENDENT_AMBULATORY_CARE_PROVIDER_SITE_OTHER): Payer: Medicare Other | Admitting: Family Medicine

## 2016-10-15 VITALS — BP 150/70 | HR 57 | Temp 98.3°F | Ht 63.0 in | Wt 131.8 lb

## 2016-10-15 DIAGNOSIS — I6523 Occlusion and stenosis of bilateral carotid arteries: Secondary | ICD-10-CM

## 2016-10-15 DIAGNOSIS — L03114 Cellulitis of left upper limb: Secondary | ICD-10-CM

## 2016-10-15 MED ORDER — CEPHALEXIN 500 MG PO CAPS: 500.0000 mg | ORAL_CAPSULE | Freq: Two times a day (BID) | ORAL | 0 refills | Status: DC

## 2016-10-15 NOTE — Patient Instructions (Signed)

## 2016-10-15 NOTE — Assessment & Plan Note (Signed)
Reaction to shingrix--- doubt cellulitis reddness up to 4 in is a common side effect  Because it is sat we will treat with keflex  Pt will cont warm compresses / ic Benadryl prn  Call or see pcp prn

## 2016-10-15 NOTE — Progress Notes (Signed)
Patient ID: [name], female    DOB: 1932/06/08  Age: 83 y.o. MRN: [id number]    Subjective:  Subjective  HPI Rebecca [name] presents for reddness L arm since Thursday.  She had her second shingles shot Wednesday and she woke up Thursday with redness .   She went back to pharmacy on Friday and they told her it might be cellulitis.  Arm is sore but no other symptoms.     Review of Systems  Constitutional: Negative for appetite change, diaphoresis, fatigue and unexpected weight change.  Eyes: Negative for pain, redness and visual disturbance.  Respiratory: Negative for cough, chest tightness, shortness of breath and wheezing.   Cardiovascular: Negative for chest pain, palpitations and leg swelling.  Endocrine: Negative for cold intolerance, heat intolerance, polydipsia, polyphagia and polyuria.  Genitourinary: Negative for difficulty urinating, dysuria and frequency.  Skin: Positive for color change. Negative for rash.  Neurological: Negative for dizziness, light-headedness, numbness and headaches.    History Past Medical History:  Diagnosis Date  . Anemia   . Anxiety   . Diverticulitis   . Diverticulosis   . Gastroparesis   . GERD (gastroesophageal reflux disease)   . Glaucoma   . Hemorrhoids   . Hyperlipidemia   . Hypertension   . Hypertension   . IBS (irritable bowel syndrome)   . Rectal ulcer   . 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 Tubular adenoma of colon 05/2009    She has a past surgical history that includes Tonsillectomy; Appendectomy; Arcuate keratectomy; Knee surgery (Bilateral); Hernia repair; double pallital tori bone removed ; morton's neuroma on rt foot and platar facial release; Mouth surgery; and Salivary gland surgery.   Her family history includes Anuerysm in her father; Breast cancer in her daughter; Cancer in her daughter; Colon cancer in her maternal aunt and paternal grandmother; Colon polyps in her brother, daughter, and sister; Diabetes in her  brother and mother; Heart disease in her brother, father, and mother; Hyperlipidemia in her brother and mother; Hypertension in her brother, father, and mother; Lung cancer in her sister; Migraines in her daughter.She reports that she has never smoked. She has never used smokeless tobacco. She reports that she drinks alcohol. She reports that she does not use drugs.  Current Outpatient Prescriptions on File Prior to Visit  Medication Sig Dispense Refill  . acetaminophen (TYLENOL) 500 MG tablet Take 1,000 mg by mouth every 6 (six) hours as needed for mild pain, moderate pain, fever or headache.    . ALPRAZolam (XANAX) 0.25 MG tablet take 1 tablet by mouth at bedtime if needed 30 tablet 5  . Artificial Tear Solution (GENTEAL TEARS) 0.1-0.2-0.3 % SOLN Place 2 drops into both eyes at bedtime.    . Calcium Carbonate-Vitamin D (CALCIUM-VITAMIN D) 500-200 MG-UNIT per tablet Take 1 tablet by mouth 2 (two) times daily with a meal.      . cetirizine (ZYRTEC) 10 MG tablet Take 10 mg by mouth daily as needed for allergies.    . Cholecalciferol (VITAMIN D3) 2000 UNITS TABS Take 1 tablet by mouth daily.     [name] Kitchen esomeprazole (NEXIUM) 20 MG capsule Take 20 mg by mouth 2 (two) times daily before a meal.    . gabapentin (NEURONTIN) 100 MG capsule Take 2 capsules, by mouth, 3 times daily 540 capsule 1  . Lactobacillus (ACIDOPHILUS PROBIOTIC) 10 MG TABS Take 10 mg by mouth 3 (three) times daily. 30 tablet 0  . losartan (COZAAR) 100 MG tablet take 1  tablet by mouth once daily 90 tablet 2  . meloxicam (MOBIC) 15 MG tablet Take 1 tablet (15 mg total) by mouth daily. (Patient taking differently: Take 15 mg by mouth daily as needed for pain. ) 30 tablet 1  . metoCLOPramide (REGLAN) 5 MG tablet take 1 tablet by mouth twice a day with meals 60 tablet 5  . ondansetron (ZOFRAN ODT) 4 MG disintegrating tablet Take 1 tablet (4 mg total) by mouth every 8 (eight) hours as needed for nausea or vomiting. 15 tablet 0  . POLY-IRON 150  150 MG capsule take 1 capsule by mouth once daily 30 capsule 3  . ranitidine (ZANTAC) 300 MG tablet Take 1 tablet (300 mg total) by mouth at bedtime. 30 tablet 11  . simvastatin (ZOCOR) 20 MG tablet take 1 tablet by mouth at bedtime 90 tablet 3  . SYNTHROID 88 MCG tablet take 1 tablet by mouth once daily 90 tablet 1  . vitamin B-12 (CYANOCOBALAMIN) 500 MCG tablet Take 500 mcg by mouth every morning.     . vitamin C (ASCORBIC ACID) 500 MG tablet Take 500 mg by mouth daily.    . vitamin E (VITAMIN E) 400 UNIT capsule Take 400 Units by mouth daily.    . Zoledronic Acid (RECLAST IV) Inject into the vein. Once per year.    . [DISCONTINUED] olmesartan (BENICAR) 20 MG tablet Take 20 mg by mouth daily.       No current facility-administered medications on file prior to visit.      Objective:  Objective  Physical Exam  Skin: Skin is warm. There is erythema.     Nursing note and vitals reviewed.  BP (!) 150/70 (BP Location: Left Arm, Patient Position: Sitting, Cuff Size: Normal)   Pulse (!) 57   Temp 98.3 F (36.8 C) (Oral)   Ht 5\' 3"  (1.6 m)   Wt 131 lb 12 oz (59.8 kg)   SpO2 98%   BMI 23.34 kg/m  Wt Readings from Last 3 Encounters:  10/15/16 131 lb 12 oz (59.8 kg)  09/14/16 130 lb 12.8 oz (59.3 kg)  07/12/16 132 lb 3.2 oz (60 kg)     Lab Results  Component Value Date   WBC 9.3 07/12/2016   HGB 12.2 07/12/2016   HCT 35.9 (L) 07/12/2016   PLT 247.0 07/12/2016   GLUCOSE 116 (H) 02/23/2016   CHOL 223 (H) 06/24/2015   TRIG 260.0 (H) 06/24/2015   HDL 42.10 06/24/2015   LDLDIRECT 106.0 06/24/2015   LDLCALC 130 (H) 12/25/2013   ALT 20 02/23/2016   AST 22 02/23/2016   NA 135 02/23/2016   K 4.1 02/23/2016   CL 104 02/23/2016   CREATININE 0.99 02/23/2016   BUN 10 02/23/2016   CO2 24 02/23/2016   TSH 0.85 01/01/2016   HGBA1C 5.6 07/26/2006    No results found.   Assessment & Plan:  Plan  I have discontinued Rebecca [name]'s metroNIDAZOLE and amoxicillin-clavulanate. I am  also having her start on cephALEXin. Additionally, I am having her maintain her calcium-vitamin D, Vitamin D3, vitamin B-12, ranitidine, esomeprazole, vitamin C, vitamin E, Zoledronic Acid (RECLAST IV), ondansetron, meloxicam, cetirizine, acetaminophen, GENTEAL TEARS, ACIDOPHILUS PROBIOTIC, simvastatin, POLY-IRON 150, losartan, SYNTHROID, metoCLOPramide, ALPRAZolam, and gabapentin.  Meds ordered this encounter  Medications  . cephALEXin (KEFLEX) 500 MG capsule    Sig: Take 1 capsule (500 mg total) by mouth 2 (two) times daily.    Dispense:  20 capsule    Refill:  0  Problem List Items Addressed This Visit      Unprioritized   Cellulitis of left upper extremity - Primary    Reaction to shingrix--- doubt cellulitis reddness up to 4 in is a common side effect  Because it is sat we will treat with keflex  Pt will cont warm compresses / ic Benadryl prn  Call or see pcp prn       Relevant Medications   cephALEXin (KEFLEX) 500 MG capsule      Follow-up: Return if symptoms worsen or fail to improve.  Ann Held, DO

## 2016-10-24 ENCOUNTER — Encounter: Payer: Self-pay | Admitting: Family Medicine

## 2016-10-25 MED ORDER — CYCLOBENZAPRINE HCL 5 MG PO TABS: ORAL_TABLET | ORAL | 0 refills | Status: DC

## 2016-11-23 DIAGNOSIS — L900 Lichen sclerosus et atrophicus: Secondary | ICD-10-CM | POA: Diagnosis not present

## 2016-11-23 DIAGNOSIS — N8111 Cystocele, midline: Secondary | ICD-10-CM | POA: Diagnosis not present

## 2016-11-23 DIAGNOSIS — B373 Candidiasis of vulva and vagina: Secondary | ICD-10-CM | POA: Diagnosis not present

## 2016-11-23 DIAGNOSIS — N816 Rectocele: Secondary | ICD-10-CM | POA: Diagnosis not present

## 2016-11-29 ENCOUNTER — Encounter: Payer: Self-pay | Admitting: Family Medicine

## 2016-11-29 ENCOUNTER — Ambulatory Visit (INDEPENDENT_AMBULATORY_CARE_PROVIDER_SITE_OTHER): Payer: Medicare Other | Admitting: Family Medicine

## 2016-11-29 VITALS — BP 144/80 | HR 84 | Temp 98.4°F | Wt 130.9 lb

## 2016-11-29 DIAGNOSIS — I6523 Occlusion and stenosis of bilateral carotid arteries: Secondary | ICD-10-CM

## 2016-11-29 DIAGNOSIS — M5416 Radiculopathy, lumbar region: Secondary | ICD-10-CM

## 2016-11-29 MED ORDER — PREDNISONE 10 MG PO TABS: ORAL_TABLET | ORAL | 0 refills | Status: DC

## 2016-11-29 MED ORDER — POLYSACCHARIDE IRON COMPLEX 150 MG PO CAPS: 150.0000 mg | ORAL_CAPSULE | Freq: Every day | ORAL | 3 refills | Status: DC

## 2016-11-29 NOTE — Patient Instructions (Signed)
Lumbosacral Radiculopathy Lumbosacral radiculopathy is a condition that involves the spinal nerves and nerve roots in the low back and bottom of the spine. The condition develops when these nerves and nerve roots move out of place or become inflamed and cause symptoms. What are the causes? This condition may be caused by:  Pressure from a disk that bulges out of place (herniated disk). A disk is a plate of cartilage that separates bones in the spine.  Disk degeneration.  A narrowing of the bones of the lower back (spinal stenosis).  A tumor.  An infection.  An injury that places sudden pressure on the disks that cushion the bones of your lower spine.  What increases the risk? This condition is more likely to develop in:  Males aged 30-50 years.  Females aged 50-60 years.  People who lift improperly.  People who are overweight or live a sedentary lifestyle.  People who smoke.  People who perform repetitive activities that strain the spine.  What are the signs or symptoms? Symptoms of this condition include:  Pain that goes down from the back into the legs (sciatica). This is the most common symptom. The pain may be worse with sitting, coughing, or sneezing.  Pain and numbness in the arms and legs.  Muscle weakness.  Tingling.  Loss of bladder control or bowel control.  How is this diagnosed? This condition is diagnosed with a physical exam and medical history. If the pain is lasting, you may have tests, such as:  MRI scan.  X-ray.  CT scan.  Myelogram.  Nerve conduction study.  How is this treated? This condition is often treated with:  Hot packs and ice applied to affected areas.  Stretches to improve flexibility.  Exercises to strengthen back muscles.  Physical therapy.  Pain medicine.  A steroid injection in the spine.  In some cases, no treatment is needed. If the condition is long-lasting (chronic), or if symptoms are severe, treatment may  involve surgery or lifestyle changes, such as following a weight loss plan. Follow these instructions at home: Medicines  Take medicines only as directed by your health care provider.  Do not drive or operate heavy machinery while taking pain medicine. Injury care  Apply a heat pack to the injured area as directed by your health care provider.  Apply ice to the affected area: ? Put ice in a plastic bag. ? Place a towel between your skin and the bag. ? Leave the ice on for 20-30 minutes, every 2 hours while you are awake or as needed. Or, leave the ice on for as long as directed by your health care provider. Other Instructions  If you were shown how to do any exercises or stretches, do them as directed by your health care provider.  If your health care provider prescribed a diet or exercise program, follow it as directed.  Keep all follow-up visits as directed by your health care provider. This is important. Contact a health care provider if:  Your pain does not improve over time even when taking pain medicines. Get help right away if:  Your develop severe pain.  Your pain suddenly gets worse.  You develop increasing weakness in your legs.  You lose the ability to control your bladder or bowel.  You have difficulty walking or balancing.  You have a fever. This information is not intended to replace advice given to you by your health care provider. Make sure you discuss any questions you have with your   health care provider. Document Released: 03/21/2005 Document Revised: 08/27/2015 Document Reviewed: 03/17/2014 Elsevier Interactive Patient Education  2018 Elsevier Inc.  

## 2016-11-29 NOTE — Progress Notes (Signed)
Subjective:     Patient ID: Rebecca [name], female   DOB: [birth year], 83 y.o.   MRN: [id number]  HPI Patient seen with recent recurrence of low back pain right lower lumbar region radiating down the right lower extremity. This radiates into her hip and all the way down to the foot at times. She's had occasional numbness in the foot. No weakness. No urine or stool incontinence. She had MRI last year April 2017 which showed severe arthritic changes L4-L5 and L5-S1. She responded very well to oral prednisone last year and eventually had epidural steroid which helped.  Current pain is 9 out of 10 severity at its worst. Symptoms are somewhat intermittent. No fevers or chills. No appetite or weight changes. No recent fall. Pain not relieved with Tylenol.  Past Medical History:  Diagnosis Date  . Anemia   . Anxiety   . Diverticulitis   . Diverticulosis   . Gastroparesis   . GERD (gastroesophageal reflux disease)   . Glaucoma   . Hemorrhoids   . Hyperlipidemia   . Hypertension   . Hypertension   . IBS (irritable bowel syndrome)   . Rectal ulcer   . 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 Tubular adenoma of colon 05/2009   Past Surgical History:  Procedure Laterality Date  . APPENDECTOMY    . ARCUATE KERATECTOMY    . double pallital tori bone removed     . HERNIA REPAIR     umbilical  . KNEE SURGERY Bilateral   . morton's neuroma on rt foot and platar facial release    . MOUTH SURGERY     "bone shaved off roof of mouth"  . SALIVARY GLAND SURGERY     removal  . TONSILLECTOMY      reports that she has never smoked. She has never used smokeless tobacco. She reports that she drinks alcohol. She reports that she does not use drugs. family history includes Anuerysm in her father; Breast cancer in her daughter; Cancer in her daughter; Colon cancer in her maternal aunt and paternal grandmother; Colon polyps in her brother, daughter, and sister; Diabetes in her brother and mother; Heart disease in her  brother, father, and mother; Hyperlipidemia in her brother and mother; Hypertension in her brother, father, and mother; Lung cancer in her sister; Migraines in her daughter. Allergies  Allergen Reactions  . Levaquin [Levofloxacin] Other (See Comments)    Very nauseous and shakey  . Sulfamethoxazole-Trimethoprim Nausea Only    shakey  . Lactose Intolerance (Gi) Diarrhea and Other (See Comments)    Stomach hurts   . Tramadol Hcl Nausea Only and Palpitations  . Augmentin [Amoxicillin-Pot Clavulanate] Diarrhea  . Ciprofloxacin Nausea Only    REACTION: Thrush, Nausea, Shakey  . Pneumococcal Vaccine Polyvalent     REACTION: RED AND RAISED RASH ON ARM  . Adhesive [Tape] Rash  . Codeine Nausea Only     Review of Systems  Constitutional: Negative for chills and fever.  Respiratory: Negative for shortness of breath.   Cardiovascular: Negative for chest pain.  Gastrointestinal: Negative for abdominal pain.  Genitourinary: Negative for dysuria.  Musculoskeletal: Positive for back pain.  Neurological: Positive for numbness. Negative for weakness.       Objective:   Physical Exam  Constitutional: She appears well-developed and well-nourished.  Cardiovascular: Normal rate and regular rhythm.   Pulmonary/Chest: Effort normal and breath sounds normal. No respiratory distress. She has no wheezes. She has no rales.  Musculoskeletal: She exhibits no edema.  Straight leg raises are negative bilaterally  Neurological:  Patient has full-strength lower extremities with plantar flexion, dorsiflexion, knee extension, and hip flexion. Deep tender reflexes are symmetric. Normal sensory function to touch       Assessment:     Right lumbar radiculitis symptoms. Nonfocal neuro exam. Known history of severe spondylosis L4-L5 and L5-S1    Plan:     -Trial of prednisone taper starting at 40 mg daily -Touch base by next week if not improving -Follow-up sooner for any weakness, progressive pain, or  other concerns -We'll recommend follow-up with neurosurgery next week if not improved on prednisone  [name] MD [facility] at [facility]

## 2016-12-14 DIAGNOSIS — H52203 Unspecified astigmatism, bilateral: Secondary | ICD-10-CM | POA: Diagnosis not present

## 2016-12-14 DIAGNOSIS — H40013 Open angle with borderline findings, low risk, bilateral: Secondary | ICD-10-CM | POA: Diagnosis not present

## 2016-12-22 ENCOUNTER — Encounter: Payer: Self-pay | Admitting: Family Medicine

## 2017-01-03 ENCOUNTER — Ambulatory Visit: Payer: Medicare Other | Admitting: Family Medicine

## 2017-01-05 DIAGNOSIS — M4726 Other spondylosis with radiculopathy, lumbar region: Secondary | ICD-10-CM | POA: Diagnosis not present

## 2017-01-11 ENCOUNTER — Ambulatory Visit (INDEPENDENT_AMBULATORY_CARE_PROVIDER_SITE_OTHER): Payer: Medicare Other | Admitting: *Deleted

## 2017-01-11 ENCOUNTER — Other Ambulatory Visit: Payer: Self-pay | Admitting: Family Medicine

## 2017-01-11 DIAGNOSIS — Z23 Encounter for immunization: Secondary | ICD-10-CM | POA: Diagnosis not present

## 2017-01-11 NOTE — Telephone Encounter (Signed)
Medication filled to pharmacy as requested.  Patient has upcoming appt scheduled.

## 2017-01-13 ENCOUNTER — Other Ambulatory Visit: Payer: Self-pay | Admitting: Family Medicine

## 2017-01-17 ENCOUNTER — Ambulatory Visit (INDEPENDENT_AMBULATORY_CARE_PROVIDER_SITE_OTHER): Payer: Medicare Other | Admitting: Family Medicine

## 2017-01-17 ENCOUNTER — Encounter: Payer: Self-pay | Admitting: Family Medicine

## 2017-01-17 ENCOUNTER — Ambulatory Visit (INDEPENDENT_AMBULATORY_CARE_PROVIDER_SITE_OTHER)
Admission: RE | Admit: 2017-01-17 | Discharge: 2017-01-17 | Disposition: A | Discharge status: Home / Self Care | Payer: Medicare Other | Source: Ambulatory Visit | Attending: Family Medicine | Admitting: Family Medicine

## 2017-01-17 VITALS — BP 130/80 | HR 60 | Temp 98.2°F | Resp 12 | Ht 63.0 in | Wt 131.5 lb

## 2017-01-17 DIAGNOSIS — I6523 Occlusion and stenosis of bilateral carotid arteries: Secondary | ICD-10-CM | POA: Diagnosis not present

## 2017-01-17 DIAGNOSIS — R05 Cough: Secondary | ICD-10-CM | POA: Diagnosis not present

## 2017-01-17 DIAGNOSIS — R079 Chest pain, unspecified: Secondary | ICD-10-CM | POA: Diagnosis not present

## 2017-01-17 DIAGNOSIS — J069 Acute upper respiratory infection, unspecified: Secondary | ICD-10-CM | POA: Diagnosis not present

## 2017-01-17 DIAGNOSIS — K219 Gastro-esophageal reflux disease without esophagitis: Secondary | ICD-10-CM | POA: Diagnosis not present

## 2017-01-17 DIAGNOSIS — R059 Cough, unspecified: Secondary | ICD-10-CM

## 2017-01-17 DIAGNOSIS — J309 Allergic rhinitis, unspecified: Secondary | ICD-10-CM | POA: Diagnosis not present

## 2017-01-17 MED ORDER — BENZONATATE 100 MG PO CAPS: 200.0000 mg | ORAL_CAPSULE | Freq: Two times a day (BID) | ORAL | 0 refills | Status: DC | PRN

## 2017-01-17 MED ORDER — IPRATROPIUM BROMIDE 0.06 % NA SOLN: 2.0000 | Freq: Four times a day (QID) | NASAL | 0 refills | Status: DC

## 2017-01-17 NOTE — Patient Instructions (Addendum)
  [name] I have seen you today for an acute visit.  A few things to remember from today's visit:   URI, acute - Plan: ipratropium (ATROVENT) 0.06 % nasal spray  Cough - Plan: benzonatate (TESSALON) 100 MG capsule, DG Chest 2 View  Chest pain, unspecified type - Plan: DG Chest 2 View  Allergic rhinitis, unspecified seasonality, unspecified trigger - Plan: ipratropium (ATROVENT) 0.06 % nasal spray  Gastroesophageal reflux disease, esophagitis presence not specified   Medications prescribed today are intended for short period of time and will not be refill upon request, a follow up appointment might be necessary to discuss continuation of of treatment if appropriate.  viral infections are self-limited and we treat each symptom depending of severity.  Over the counter medications as decongestants and cold medications usually help, they need to be taken with caution if there is a history of high blood pressure or palpitations.  Tylenol and/or Ibuprofen also helps with most symptoms (headache, muscle aching, fever,etc) Plenty of fluids. Honey helps with cough. Steam inhalations helps with runny nose, nasal congestion, and may prevent sinus infections. Cough and nasal congestion could last a few days and sometimes weeks. Please follow in not any better in 1-2 weeks or if symptoms get worse.  Today X ray was ordered.  This can be done at [facility] between 8 am and 5 pm: [place]. [phone].    In general please monitor for signs of worsening symptoms and seek immediate medical attention if any concerning.  If symptoms are not resolved in 1-2 weeks you should schedule a follow up appointment with your doctor, before if needed.  I hope you get better soon!

## 2017-01-17 NOTE — Progress Notes (Signed)
ACUTE VISIT  HPI:  Chief Complaint  Patient presents with  . Head Cold    RebeccaADELI [name] is a 83 y.o.female here today complaining of 3-4 days of respiratory symptoms.  Initially she felt "cold on throat and head" but now "it is in " her lungs for the past 1-2 days.  Cough  This is a new problem. The current episode started in the past 7 days. The problem has been unchanged. The problem occurs every few hours. The cough is productive of sputum. Associated symptoms include chest pain, headaches, heartburn, nasal congestion, postnasal drip and rhinorrhea. Pertinent negatives include no chills, ear congestion, ear pain, eye redness, fever, hemoptysis, myalgias, rash, sore throat, shortness of breath, sweats, weight loss or wheezing. Nothing aggravates the symptoms. The treatment provided no relief. Her past medical history is significant for environmental allergies.   Productive cough,cannot bring sputum up.  Burning mid chest pain with coughing spells. Hx of GERD, she is on Nexium 20 mg and Ranitidine.  + Heartburn. Denies exertional chest pain,orthopnea,or PND.  Denies abdominal pain, nausea, vomiting, changes in bowel habits, blood in stool or melena. Frontal pressure headache, mild.  No Hx of recent travel. No sick contact. No known insect bite.  Hx of allergies: Yes. She is on Zyrtec 10 mg and nasal saline.  OTC medications for this problem: Mucinex.  Symptoms otherwise stable.  Concerned about having a "touch of pneumonia."  Review of Systems  Constitutional: Positive for fatigue. Negative for activity change, appetite change, chills, fever and weight loss.  HENT: Positive for congestion, postnasal drip and rhinorrhea. Negative for ear pain, mouth sores, sinus pressure, sneezing, sore throat, trouble swallowing and voice change.   Eyes: Negative for discharge, redness and itching.  Respiratory: Positive for cough. Negative for hemoptysis, shortness of  breath and wheezing.   Cardiovascular: Positive for chest pain. Negative for leg swelling.  Gastrointestinal: Positive for heartburn. Negative for abdominal pain, diarrhea, nausea and vomiting.  Genitourinary: Negative for decreased urine volume, dysuria and hematuria.  Musculoskeletal: Negative for back pain, joint swelling, myalgias and neck pain.  Skin: Negative for pallor and rash.  Allergic/Immunologic: Positive for environmental allergies.  Neurological: Positive for headaches. Negative for syncope and weakness.  Hematological: Negative for adenopathy. Does not bruise/bleed easily.  Psychiatric/Behavioral: Negative for confusion. The patient is nervous/anxious.      Current Outpatient Prescriptions on File Prior to Visit  Medication Sig Dispense Refill  . acetaminophen (TYLENOL) 500 MG tablet Take 1,000 mg by mouth every 6 (six) hours as needed for mild pain, moderate pain, fever or headache.    . ALPRAZolam (XANAX) 0.25 MG tablet take 1 tablet by mouth at bedtime if needed 30 tablet 5  . Artificial Tear Solution (GENTEAL TEARS) 0.1-0.2-0.3 % SOLN Place 2 drops into both eyes at bedtime.    . Calcium Carbonate-Vitamin D (CALCIUM-VITAMIN D) 500-200 MG-UNIT per tablet Take 1 tablet by mouth 2 (two) times daily with a meal.      . cetirizine (ZYRTEC) 10 MG tablet Take 10 mg by mouth daily as needed for allergies.    . Cholecalciferol (VITAMIN D3) 2000 UNITS TABS Take 1 tablet by mouth daily.     . cyclobenzaprine (FLEXERIL) 5 MG tablet Take 1-2 tablets every 6 hours as needed for muscle spasms 30 tablet 0  . esomeprazole (NEXIUM) 20 MG capsule Take 20 mg by mouth 2 (two) times daily before a meal.    . gabapentin (NEURONTIN) 100  MG capsule Take 2 capsules, by mouth, 3 times daily 540 capsule 1  . iron polysaccharides (POLY-IRON 150) 150 MG capsule Take 1 capsule (150 mg total) by mouth daily. 30 capsule 3  . Lactobacillus (ACIDOPHILUS PROBIOTIC) 10 MG TABS Take 10 mg by mouth 3 (three)  times daily. 30 tablet 0  . losartan (COZAAR) 100 MG tablet take 1 tablet by mouth once daily 90 tablet 2  . meloxicam (MOBIC) 15 MG tablet Take 1 tablet (15 mg total) by mouth daily. (Patient taking differently: Take 15 mg by mouth daily as needed for pain. ) 30 tablet 1  . metoCLOPramide (REGLAN) 5 MG tablet take 1 tablet by mouth twice a day with meals 60 tablet 5  . ondansetron (ZOFRAN ODT) 4 MG disintegrating tablet Take 1 tablet (4 mg total) by mouth every 8 (eight) hours as needed for nausea or vomiting. 15 tablet 0  . ranitidine (ZANTAC) 300 MG tablet Take 1 tablet (300 mg total) by mouth at bedtime. 30 tablet 11  . simvastatin (ZOCOR) 20 MG tablet take 1 tablet by mouth at bedtime 90 tablet 3  . SYNTHROID 88 MCG tablet take 1 tablet by mouth once daily 90 tablet 1  . vitamin B-12 (CYANOCOBALAMIN) 500 MCG tablet Take 500 mcg by mouth every morning.     . vitamin C (ASCORBIC ACID) 500 MG tablet Take 500 mg by mouth daily.    . vitamin E (VITAMIN E) 400 UNIT capsule Take 400 Units by mouth daily.    . Zoledronic Acid (RECLAST IV) Inject into the vein. Once per year.    . [DISCONTINUED] olmesartan (BENICAR) 20 MG tablet Take 20 mg by mouth daily.       No current facility-administered medications on file prior to visit.     Past Medical History:  Diagnosis Date  . Anemia   . Anxiety   . Diverticulitis   . Diverticulosis   . Gastroparesis   . GERD (gastroesophageal reflux disease)   . Glaucoma   . Hemorrhoids   . Hyperlipidemia   . Hypertension   . Hypertension   . IBS (irritable bowel syndrome)   . Rectal ulcer   . 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 Tubular adenoma of colon 05/2009   Allergies  Allergen Reactions  . Levaquin [Levofloxacin] Other (See Comments)    Very nauseous and shakey  . Sulfamethoxazole-Trimethoprim Nausea Only    shakey  . Lactose Intolerance (Gi) Diarrhea and Other (See Comments)    Stomach hurts   . Tramadol Hcl Nausea Only and Palpitations  .  Augmentin [Amoxicillin-Pot Clavulanate] Diarrhea  . Ciprofloxacin Nausea Only    REACTION: Thrush, Nausea, Shakey  . Pneumococcal Vaccine Polyvalent     REACTION: RED AND RAISED RASH ON ARM  . Adhesive [Tape] Rash  . Codeine Nausea Only    Social History   Social History  . Marital status: Widowed    Spouse name: N/A  . Number of children: N/A  . Years of education: N/A   Social History Main Topics  . Smoking status: Never Smoker  . Smokeless tobacco: Never Used  . Alcohol use 0.0 oz/week     Comment: rare  . Drug use: No  . Sexual activity: Yes   Other Topics Concern  . None   Social History Narrative  . None    Vitals:   01/17/17 1139  BP: 130/80  Pulse: 60  Resp: 12  Temp: 98.2 F (36.8 C)  SpO2: 98%  Body mass index is 23.29 kg/m.   Physical Exam  Nursing note and vitals reviewed. Constitutional: She is oriented to person, place, and time. She appears well-developed and well-nourished. She does not appear ill. No distress.  HENT:  Head: Normocephalic and atraumatic.  Nose: Rhinorrhea present. Right sinus exhibits no maxillary sinus tenderness and no frontal sinus tenderness. Left sinus exhibits no maxillary sinus tenderness and no frontal sinus tenderness.  Mouth/Throat: Oropharynx is clear and moist and mucous membranes are normal.  Postnasal drainage.  Eyes: Conjunctivae are normal.  Neck: No muscular tenderness present. No edema and no erythema present.  Cardiovascular: Normal rate and regular rhythm.   No murmur heard. Respiratory: Effort normal and breath sounds normal. No stridor. No respiratory distress. She exhibits tenderness (bilateral).  GI: Soft. She exhibits no mass. There is no tenderness.  Musculoskeletal: She exhibits no edema.  Lymphadenopathy:       Head (right side): No submandibular adenopathy present.       Head (left side): No submandibular adenopathy present.    She has no cervical adenopathy.  Neurological: She is alert and  oriented to person, place, and time. She has normal strength.  Skin: Skin is warm. No rash noted. No erythema.  Psychiatric: Her speech is normal. Her mood appears anxious.  Well groomed, good eye contact.    ASSESSMENT AND PLAN:   Ms. Lockie was seen today for head cold.  Diagnoses and all orders for this visit:  URI, acute  Symptoms suggests a viral etiology, so I do not think abx is needed at this time. Instructed to monitor for signs of complications, including new onset of fever among some, clearly instructed about warning signs. Symptomatic treatment recommended at this time. F/U as needed.  -     ipratropium (ATROVENT) 0.06 % nasal spray; Place 2 sprays into both nostrils 4 (four) times daily.  Cough  Today lung auscultation negative. I also explained that cough and nasal congestion can last a few days and sometimes weeks.   -     benzonatate (TESSALON) 100 MG capsule; Take 2 capsules (200 mg total) by mouth 2 (two) times daily as needed. -     DG Chest 2 View; Future  Chest pain, unspecified type  Musculoskeletal most likely. Other possible causes discussed, ? GERD Instructed about warning signs.  -     DG Chest 2 View; Future  Allergic rhinitis, unspecified seasonality, unspecified trigger  Continue nasal irrigations with saline. Continue OTC Zyrtec 10 mg daily. Start Atrovent nasal spray as instructed. Follow-up as needed.  -     ipratropium (ATROVENT) 0.06 % nasal spray; Place 2 sprays into both nostrils 4 (four) times daily.  Gastroesophageal reflux disease, esophagitis presence not specified  If cough is persistent or heartburn gets worse instructed to increase Nexium to 20 mg bid for a few weeks. GERD precautions to continue. F/U as needed.    -Ms. Basma B Espinoza advised to seek attention immediately if symptoms worsen or to follow with PCP if symptoms persist or new concerns arise.       Banyan Goodchild G. [place], MD  [facility]. [place] office.

## 2017-01-23 ENCOUNTER — Other Ambulatory Visit: Payer: Self-pay | Admitting: *Deleted

## 2017-01-23 DIAGNOSIS — I739 Peripheral vascular disease, unspecified: Principal | ICD-10-CM

## 2017-01-23 DIAGNOSIS — I779 Disorder of arteries and arterioles, unspecified: Secondary | ICD-10-CM

## 2017-01-24 ENCOUNTER — Encounter: Payer: Self-pay | Admitting: Family Medicine

## 2017-01-24 ENCOUNTER — Ambulatory Visit (INDEPENDENT_AMBULATORY_CARE_PROVIDER_SITE_OTHER): Payer: Medicare Other | Admitting: Family Medicine

## 2017-01-24 VITALS — BP 102/70 | HR 58 | Temp 97.9°F | Ht 63.0 in | Wt 132.4 lb

## 2017-01-24 DIAGNOSIS — I6523 Occlusion and stenosis of bilateral carotid arteries: Secondary | ICD-10-CM | POA: Diagnosis not present

## 2017-01-24 DIAGNOSIS — M810 Age-related osteoporosis without current pathological fracture: Secondary | ICD-10-CM

## 2017-01-24 DIAGNOSIS — E785 Hyperlipidemia, unspecified: Secondary | ICD-10-CM | POA: Diagnosis not present

## 2017-01-24 DIAGNOSIS — I10 Essential (primary) hypertension: Secondary | ICD-10-CM

## 2017-01-24 DIAGNOSIS — E038 Other specified hypothyroidism: Secondary | ICD-10-CM | POA: Diagnosis not present

## 2017-01-24 DIAGNOSIS — N8111 Cystocele, midline: Secondary | ICD-10-CM | POA: Diagnosis not present

## 2017-01-24 DIAGNOSIS — N816 Rectocele: Secondary | ICD-10-CM | POA: Diagnosis not present

## 2017-01-24 LAB — BASIC METABOLIC PANEL
BUN: 10 mg/dL (ref 6–23)
CO2: 29 mEq/L (ref 19–32)
Calcium: 10.2 mg/dL (ref 8.4–10.5)
Chloride: 97 mEq/L (ref 96–112)
Creatinine, Ser: 0.89 mg/dL (ref 0.40–1.20)
GFR: 64.55 mL/min (ref >60.00)
Glucose, Bld: 105 mg/dL — ABNORMAL HIGH (ref 70–99)
Potassium: 4.5 mEq/L (ref 3.5–5.1)
Sodium: 133 mEq/L — ABNORMAL LOW (ref 135–145)

## 2017-01-24 LAB — HEPATIC FUNCTION PANEL
ALT: 15 U/L (ref 0–35)
AST: 19 U/L (ref 0–37)
Albumin: 4.5 g/dL (ref 3.5–5.2)
Alkaline Phosphatase: 61 U/L (ref 39–117)
Bilirubin, Direct: 0.1 mg/dL (ref 0.0–0.3)
Total Bilirubin: 0.6 mg/dL (ref 0.2–1.2)
Total Protein: 6.6 g/dL (ref 6.0–8.3)

## 2017-01-24 LAB — LIPID PANEL
Cholesterol: 249 mg/dL — ABNORMAL HIGH (ref 0–200)
HDL: 40.9 mg/dL (ref >39.00)
Total CHOL/HDL Ratio: 6
Triglycerides: 491 mg/dL — ABNORMAL HIGH (ref 0.0–149.0)

## 2017-01-24 LAB — LDL CHOLESTEROL, DIRECT: Direct LDL: 104 mg/dL

## 2017-01-24 LAB — TSH: TSH: 1.66 u[IU]/mL (ref 0.35–4.50)

## 2017-01-24 NOTE — Progress Notes (Signed)
Subjective:     Patient ID: Rebecca [name], female   DOB: [birth year], 83 y.o.   MRN: [id number]  HPI Patient seen for medical follow-up. She has chronic problems including history of migraine headaches, hypertension, GERD, gastroparesis, hypothyroidism, lumbar scoliosis, osteoporosis, spinal stenosis, hyperlipidemia.  Medications reviewed. Compliant with all. No recent falls. She has been on Reclast injectable the past 5 years and is considering skipping this year. Last bone density scan was 04/30/2015.  Patient had cough for the past 10 days. No fever. No wheezing. No shortness of breath. Minimal nasal congestion. Cough gradually improving.  No recent falls. She feels she has good balance.. Denies any depression issues.  Past Medical History:  Diagnosis Date  . Anemia   . Anxiety   . Diverticulitis   . Diverticulosis   . Gastroparesis   . GERD (gastroesophageal reflux disease)   . Glaucoma   . Hemorrhoids   . Hyperlipidemia   . Hypertension   . Hypertension   . IBS (irritable bowel syndrome)   . Rectal ulcer   . 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 Tubular adenoma of colon 05/2009   Past Surgical History:  Procedure Laterality Date  . APPENDECTOMY    . ARCUATE KERATECTOMY    . double pallital tori bone removed     . HERNIA REPAIR     umbilical  . KNEE SURGERY Bilateral   . morton's neuroma on rt foot and platar facial release    . MOUTH SURGERY     "bone shaved off roof of mouth"  . SALIVARY GLAND SURGERY     removal  . TONSILLECTOMY      reports that she has never smoked. She has never used smokeless tobacco. She reports that she drinks alcohol. She reports that she does not use drugs. family history includes Anuerysm in her father; Breast cancer in her daughter; Cancer in her daughter; Colon cancer in her maternal aunt and paternal grandmother; Colon polyps in her brother, daughter, and sister; Diabetes in her brother and mother; Heart disease in her brother, father, and  mother; Hyperlipidemia in her brother and mother; Hypertension in her brother, father, and mother; Lung cancer in her sister; Migraines in her daughter. Allergies  Allergen Reactions  . Levaquin [Levofloxacin] Other (See Comments)    Very nauseous and shakey  . Sulfamethoxazole-Trimethoprim Nausea Only    shakey  . Lactose Intolerance (Gi) Diarrhea and Other (See Comments)    Stomach hurts   . Tramadol Hcl Nausea Only and Palpitations  . Augmentin [Amoxicillin-Pot Clavulanate] Diarrhea  . Ciprofloxacin Nausea Only    REACTION: Thrush, Nausea, Shakey  . Pneumococcal Vaccine Polyvalent     REACTION: RED AND RAISED RASH ON ARM  . Adhesive [Tape] Rash  . Codeine Nausea Only     Review of Systems  Constitutional: Negative for appetite change, chills, fatigue, fever and unexpected weight change.  Eyes: Negative for visual disturbance.  Respiratory: Positive for cough. Negative for chest tightness, shortness of breath and wheezing.   Cardiovascular: Negative for chest pain, palpitations and leg swelling.  Neurological: Negative for dizziness, seizures, syncope, weakness, light-headedness and headaches.       Objective:   Physical Exam  Constitutional: She appears well-developed and well-nourished.  HENT:  Right Ear: External ear normal.  Left Ear: External ear normal.  Mouth/Throat: Oropharynx is clear and moist.  Eyes: Pupils are equal, round, and reactive to light.  Neck: Neck supple. No JVD present. No thyromegaly present.  Cardiovascular: Normal rate  and regular rhythm.  Exam reveals no gallop.   Pulmonary/Chest: Effort normal and breath sounds normal. No respiratory distress. She has no wheezes. She has no rales.  Musculoskeletal: She exhibits no edema.  Lymphadenopathy:    She has no cervical adenopathy.  Neurological: She is alert.       Assessment:     #1 hypertension stable and at goal  #2 hypothyroidism  #3 hyperlipidemia  #4 osteoporosis    Plan:      -Continue current medications -Ordered future bone density scan for after 04/29/2017 -Check labs today with TSH, lipid panel, hepatic panel, basic metabolic panel -Flu vaccine already given  [name] MD [facility]

## 2017-01-24 NOTE — Patient Instructions (Signed)
Set up Medicare Wellness visit with Rebecca [name].

## 2017-01-31 ENCOUNTER — Ambulatory Visit: Payer: Medicare Other | Admitting: Family Medicine

## 2017-01-31 DIAGNOSIS — Z0289 Encounter for other administrative examinations: Secondary | ICD-10-CM

## 2017-02-01 ENCOUNTER — Ambulatory Visit (INDEPENDENT_AMBULATORY_CARE_PROVIDER_SITE_OTHER): Payer: Medicare Other | Admitting: Family Medicine

## 2017-02-01 ENCOUNTER — Other Ambulatory Visit: Payer: Medicare Other

## 2017-02-01 ENCOUNTER — Encounter: Payer: Self-pay | Admitting: Family Medicine

## 2017-02-01 VITALS — Temp 98.4°F | Ht 63.0 in | Wt 132.0 lb

## 2017-02-01 DIAGNOSIS — D509 Iron deficiency anemia, unspecified: Secondary | ICD-10-CM

## 2017-02-01 DIAGNOSIS — J209 Acute bronchitis, unspecified: Secondary | ICD-10-CM

## 2017-02-01 DIAGNOSIS — R7989 Other specified abnormal findings of blood chemistry: Secondary | ICD-10-CM

## 2017-02-01 DIAGNOSIS — I6523 Occlusion and stenosis of bilateral carotid arteries: Secondary | ICD-10-CM | POA: Diagnosis not present

## 2017-02-01 LAB — CBC WITH DIFFERENTIAL/PLATELET
Basophils Absolute: 0.1 10*3/uL (ref 0.0–0.1)
Basophils Relative: 0.8 % (ref 0.0–3.0)
Eosinophils Absolute: 0.2 10*3/uL (ref 0.0–0.7)
Eosinophils Relative: 2.5 % (ref 0.0–5.0)
HCT: 38.1 % (ref 36.0–46.0)
Hemoglobin: 12.6 g/dL (ref 12.0–15.0)
Lymphocytes Relative: 54.5 % — ABNORMAL HIGH (ref 12.0–46.0)
Lymphs Abs: 5.2 10*3/uL — ABNORMAL HIGH (ref 0.7–4.0)
MCHC: 33.1 g/dL (ref 30.0–36.0)
MCV: 99.1 fl (ref 78.0–100.0)
Monocytes Absolute: 0.6 10*3/uL (ref 0.1–1.0)
Monocytes Relative: 6.7 % (ref 3.0–12.0)
Neutro Abs: 3.4 10*3/uL (ref 1.4–7.7)
Neutrophils Relative %: 35.5 % — ABNORMAL LOW (ref 43.0–77.0)
Platelets: 236 10*3/uL (ref 150.0–400.0)
RBC: 3.85 Mil/uL — ABNORMAL LOW (ref 3.87–5.11)
RDW: 13.2 % (ref 11.5–15.5)
WBC: 9.6 10*3/uL (ref 4.0–10.5)

## 2017-02-01 LAB — FERRITIN: Ferritin: 479.7 ng/mL — ABNORMAL HIGH (ref 10.0–291.0)

## 2017-02-01 MED ORDER — AZITHROMYCIN 250 MG PO TABS: ORAL_TABLET | ORAL | 0 refills | Status: DC

## 2017-02-01 NOTE — Patient Instructions (Signed)
WE NOW OFFER   [facility] FAST TRACK!!!  SAME DAY Appointments for ACUTE CARE  Such as: Sprains, Injuries, cuts, abrasions, rashes, muscle pain, joint pain, back pain Colds, flu, sore throats, headache, allergies, cough, fever  Ear pain, sinus and eye infections Abdominal pain, nausea, vomiting, diarrhea, upset stomach Animal/insect bites  3 Easy Ways to Schedule: Walk-In Scheduling Call in scheduling Mychart Sign-up: [email or web address]         

## 2017-02-01 NOTE — Progress Notes (Signed)
   Subjective:    Patient ID: MABRY SANTARELLI, female    DOB: [birth year], 83 y.o.   MRN: [id number]  HPI Here for 2 things. First she has had chest congestion and coughing up yellow sputum for 2 and 1/2 weeks. No fever. Using Mucinex. Also she asks to check her iron level. She takes iron supplements and her last CBC was drawn in April.    Review of Systems  Constitutional: Negative.   HENT: Negative.   Eyes: Negative.   Respiratory: Positive for cough and chest tightness. Negative for shortness of breath and wheezing.        Objective:   Physical Exam  Constitutional: She appears well-developed and well-nourished.  HENT:  Right Ear: External ear normal.  Left Ear: External ear normal.  Nose: Nose normal.  Mouth/Throat: Oropharynx is clear and moist.  Eyes: Conjunctivae are normal.  Neck: No thyromegaly present.  Pulmonary/Chest: Effort normal and breath sounds normal. No respiratory distress. She has no wheezes. She has no rales.  Lymphadenopathy:    She has no cervical adenopathy.          Assessment & Plan:  Treat the bronchitis with a Zpack. For the anemia, check a CBC and a ferritin.  Alysia Penna, MD

## 2017-02-02 LAB — EXTRA LAV TOP TUBE

## 2017-02-02 LAB — PATHOLOGIST SMEAR REVIEW

## 2017-02-06 ENCOUNTER — Encounter: Payer: Self-pay | Admitting: Family Medicine

## 2017-02-07 NOTE — Addendum Note (Signed)
Addended by: Alysia Penna A on: 02/07/2017 08:42 PM   Modules accepted: Orders

## 2017-02-08 ENCOUNTER — Ambulatory Visit (INDEPENDENT_AMBULATORY_CARE_PROVIDER_SITE_OTHER): Payer: Medicare Other

## 2017-02-08 VITALS — BP 136/76 | HR 67 | Ht 63.0 in | Wt 131.3 lb

## 2017-02-08 DIAGNOSIS — Z0000 Encounter for general adult medical examination without abnormal findings: Secondary | ICD-10-CM | POA: Diagnosis not present

## 2017-02-08 NOTE — Patient Instructions (Addendum)
Ms. Rebecca [name] , Thank you for taking time to come for your Medicare Wellness Visit. I appreciate your ongoing commitment to your health goals. Please review the following plan we discussed and let me know if I can assist you in the future.    to call you back regarding the ferritin level and testing  A Tetanus is recommended every 10 years. Medicare covers a tetanus if you have a cut or wound; otherwise, there may be a charge. If you had not had a tetanus with pertusses, known as the Tdap, you can take this anytime.   Ask Dr. Fogleman what her opinion is regarding the Calcium level and supplement. 1200mg is what you need; you may be getting this in food;  [email or web address].[email or web address]  You can call [employer] and tell them your triglycerides are back up since you came off the VYTORIN and would like to see what you need to do to get this restarted. (APPEAL the decision )  Can check with a broker or medicare to see if any other carrier of Part D covers Vytorin / Trig now 491   Medicare site; google best plan for 2019   These are the goals we discussed:Will try to walk 5 days a week x 30 minutes  Goals    None      This is a list of the screening recommended for you and due dates:  Health Maintenance  Topic Date Due  . Tetanus Vaccine  07/29/2018  . Flu Shot  Completed  . DEXA scan (bone density measurement)  Completed  . Mammogram  Discontinued   Food Choices to Lower Your Triglycerides Triglycerides are a type of fat in your blood. High levels of triglycerides can increase the risk of heart disease and stroke. If your triglyceride levels are high, the foods you eat and your eating habits are very important. Choosing the right foods can help lower your triglycerides. What general guidelines do I need to follow?  Lose weight if you are overweight.  Limit or avoid alcohol.  Fill one half of your plate with vegetables and green salads.  Limit fruit to two servings a day. Choose  fruit instead of juice.  Make one fourth of your plate whole grains. Look for the word "whole" as the first word in the ingredient list.  Fill one fourth of your plate with lean protein foods.  Enjoy fatty fish (such as salmon, mackerel, sardines, and tuna) three times a week.  Choose healthy fats.  Limit foods high in starch and sugar.  Eat more home-cooked food and less restaurant, buffet, and fast food.  Limit fried foods.  Cook foods using methods other than frying.  Limit saturated fats.  Check ingredient lists to avoid foods with partially hydrogenated oils (trans fats) in them. What foods can I eat? Grains Whole grains, such as whole wheat or whole grain breads, crackers, cereals, and pasta. Unsweetened oatmeal, bulgur, barley, quinoa, or brown rice. Corn or whole wheat flour tortillas. Vegetables Fresh or frozen vegetables (raw, steamed, roasted, or grilled). Green salads. Fruits All fresh, canned (in natural juice), or frozen fruits. Meat and Other Protein Products Ground beef (85% or leaner), grass-fed beef, or beef trimmed of fat. Skinless chicken or turkey. Ground chicken or turkey. Pork trimmed of fat. All fish and seafood. Eggs. Dried beans, peas, or lentils. Unsalted nuts or seeds. Unsalted canned or dry beans. Dairy Low-fat dairy products, such as skim or 1% milk, 2% or reduced-fat cheeses, low-fat ricotta or cottage cheese,   or plain low-fat yogurt. Fats and Oils Tub margarines without trans fats. Light or reduced-fat mayonnaise and salad dressings. Avocado. Safflower, olive, or canola oils. Natural peanut or almond butter. The items listed above may not be a complete list of recommended foods or beverages. Contact your dietitian for more options. What foods are not recommended? Grains White bread. White pasta. White rice. Cornbread. Bagels, pastries, and croissants. Crackers that contain trans fat. Vegetables White potatoes. Corn. Creamed or fried vegetables.  Vegetables in a cheese sauce. Fruits Dried fruits. Canned fruit in light or heavy syrup. Fruit juice. Meat and Other Protein Products Fatty cuts of meat. Ribs, chicken wings, bacon, sausage, bologna, salami, chitterlings, fatback, hot dogs, bratwurst, and packaged luncheon meats. Dairy Whole or 2% milk, cream, half-and-half, and cream cheese. Whole-fat or sweetened yogurt. Full-fat cheeses. Nondairy creamers and whipped toppings. Processed cheese, cheese spreads, or cheese curds. Sweets and Desserts Corn syrup, sugars, honey, and molasses. Candy. Jam and jelly. Syrup. Sweetened cereals. Cookies, pies, cakes, donuts, muffins, and ice cream. Fats and Oils Butter, stick margarine, lard, shortening, ghee, or bacon fat. Coconut, palm kernel, or palm oils. Beverages Alcohol. Sweetened drinks (such as sodas, lemonade, and fruit drinks or punches). The items listed above may not be a complete list of foods and beverages to avoid. Contact your dietitian for more information. This information is not intended to replace advice given to you by your health care provider. Make sure you discuss any questions you have with your health care provider. Document Released: 01/07/2004 Document Revised: 08/27/2015 Document Reviewed: 01/23/2013 Elsevier Interactive Patient Education  2017 [place] for Type 2 Diabetes A screening test for type 2 diabetes (type 2 diabetes mellitus) is a blood test to measure your blood sugar (glucose) level. This test is done to check for early signs of diabetes, before you develop symptoms. Type 2 diabetes is a long-term (chronic) disease that occurs when the pancreas does not make enough of a hormone called insulin. This results in high blood glucose levels, which can cause many complications. You may be screened for type 2 diabetes as part of your regular health care, especially if you have a high risk for diabetes. Screening can help identify type 2 diabetes at  its early stage (prediabetes). Identifying and treating prediabetes may delay or prevent development of type 2 diabetes. What are the risk factors for type 2 diabetes? The following factors may make you more likely to develop type 2 diabetes:  Having a parent or sibling (first-degree relative) who has diabetes.  Being overweight or obese.  Being of American-Indian, [place], Hispanic, Latino, Asian, or African-American descent.  Not getting enough exercise.  Being older than 48.  Having a history of diabetes during pregnancy (gestational diabetes).  Having low levels of good cholesterol (HDL-C) or high levels of blood fats (triglycerides).  Having high blood glucose in a previous blood test.  Having high blood pressure.  Having certain diseases or conditions, including: ? Acanthosis nigricans. This is a condition that causes dark skin on the neck, armpits, and groin. ? Polycystic ovary syndrome (PCOS). ? Heart disease.  Having delivered a baby who weighed more than 9 lb (4.1 kg).  Who should be screened for type 2 diabetes? Adults  Adults age 71 and older. These adults should be screened at least once every three years.  Adults who are younger than 82, overweight, and have at least one other risk factor. These adults should be screened at least once  every three years.  Adults who have normal blood glucose levels and two or more risk factors. These adults may be screened once every year (annually).  Women who have had gestational diabetes in the past. These women should be screened at least once every three years.  Pregnant women who have risk factors. These women should be screened at their first prenatal visit.  Pregnant women with no risk factors. These women should be screened between weeks 24 and 28 of pregnancy. Children and adolescents  Children and adolescents should be screened for type 2 diabetes if they are overweight and have 2 of the following risk  factors: ? A family history of type 2 diabetes. ? Being a member of a high risk race or ethnic group. ? Signs of insulin resistance or conditions associated with insulin resistance. ? A mother who had gestational diabetes while pregnant with him or her.  Screening should be done at least once every three years, starting at age 63. Your health care provider or your child's health care provider may recommend having a screening more or less often. What happens during screening? During screening, your health care provider may ask questions about:  Your health and your risk factors, including your activity level and any medical conditions that you have.  The health of your first-degree relatives.  Past pregnancies, if this applies.  Your health care provider will also do a physical exam, including a blood pressure measurement and blood tests. There are four blood tests that can be used to screen for type 2 diabetes. You may have one or more of the following:  A fasting plasma glucose test (FBG). You will not be allowed to eat for at least eight hours before a blood sample is taken.  A random blood glucose test. This test checks your blood glucose at any time of the day regardless of when you ate.  An oral glucose tolerance test (OGTT). This test measures your blood glucose at two times: ? After you have not eaten (have fasted) overnight. ? Two hours after you drink a glucose-containing beverage. A diagnosis can be made if the level is greater than 200 mg/dL.  An A1c test. This test provides information about blood glucose control over the previous three months.  What do the results mean? Your test results are a measurement of how much glucose is in your blood. Normal blood glucose levels mean that you do not have diabetes or prediabetes. High blood glucose levels may mean that you have prediabetes or diabetes. Depending on the results, other tests may be needed to confirm the  diagnosis. This information is not intended to replace advice given to you by your health care provider. Make sure you discuss any questions you have with your health care provider. Document Released: 01/15/2009 Document Revised: 08/27/2015 Document Reviewed: 01/16/2015 Elsevier Interactive Patient Education  2017 [place] Prevention in the Home Falls can cause injuries. They can happen to people of all ages. There are many things you can do to make your home safe and to help prevent falls. What can I do on the outside of my home?  Regularly fix the edges of walkways and driveways and fix any cracks.  Remove anything that might make you trip as you walk through a door, such as a raised step or threshold.  Trim any bushes or trees on the path to your home.  Use bright outdoor lighting.  Clear any walking paths of anything that might make  someone trip, such as rocks or tools.  Regularly check to see if handrails are loose or broken. Make sure that both sides of any steps have handrails.  Any raised decks and porches should have guardrails on the edges.  Have any leaves, snow, or ice cleared regularly.  Use sand or salt on walking paths during winter.  Clean up any spills in your garage right away. This includes oil or grease spills. What can I do in the bathroom?  Use night lights.  Install grab bars by the toilet and in the tub and shower. Do not use towel bars as grab bars.  Use non-skid mats or decals in the tub or shower.  If you need to sit down in the shower, use a plastic, non-slip stool.  Keep the floor dry. Clean up any water that spills on the floor as soon as it happens.  Remove soap buildup in the tub or shower regularly.  Attach bath mats securely with double-sided non-slip rug tape.  Do not have throw rugs and other things on the floor that can make you trip. What can I do in the bedroom?  Use night lights.  Make sure that you have a light by  your bed that is easy to reach.  Do not use any sheets or blankets that are too big for your bed. They should not hang down onto the floor.  Have a firm chair that has side arms. You can use this for support while you get dressed.  Do not have throw rugs and other things on the floor that can make you trip. What can I do in the kitchen?  Clean up any spills right away.  Avoid walking on wet floors.  Keep items that you use a lot in easy-to-reach places.  If you need to reach something above you, use a strong step stool that has a grab bar.  Keep electrical cords out of the way.  Do not use floor polish or wax that makes floors slippery. If you must use wax, use non-skid floor wax.  Do not have throw rugs and other things on the floor that can make you trip. What can I do with my stairs?  Do not leave any items on the stairs.  Make sure that there are handrails on both sides of the stairs and use them. Fix handrails that are broken or loose. Make sure that handrails are as long as the stairways.  Check any carpeting to make sure that it is firmly attached to the stairs. Fix any carpet that is loose or worn.  Avoid having throw rugs at the top or bottom of the stairs. If you do have throw rugs, attach them to the floor with carpet tape.  Make sure that you have a light switch at the top of the stairs and the bottom of the stairs. If you do not have them, ask someone to add them for you. What else can I do to help prevent falls?  Wear shoes that: ? Do not have high heels. ? Have rubber bottoms. ? Are comfortable and fit you well. ? Are closed at the toe. Do not wear sandals.  If you use a stepladder: ? Make sure that it is fully opened. Do not climb a closed stepladder. ? Make sure that both sides of the stepladder are locked into place. ? Ask someone to hold it for you, if possible.  Clearly mark and make sure that you can see: ? Any   grab bars or handrails. ? First and  last steps. ? Where the edge of each step is.  Use tools that help you move around (mobility aids) if they are needed. These include: ? Canes. ? Walkers. ? Scooters. ? Crutches.  Turn on the lights when you go into a dark area. Replace any light bulbs as soon as they burn out.  Set up your furniture so you have a clear path. Avoid moving your furniture around.  If any of your floors are uneven, fix them.  If there are any pets around you, be aware of where they are.  Review your medicines with your doctor. Some medicines can make you feel dizzy. This can increase your chance of falling. Ask your doctor what other things that you can do to help prevent falls. This information is not intended to replace advice given to you by your health care provider. Make sure you discuss any questions you have with your health care provider. Document Released: 01/15/2009 Document Revised: 08/27/2015 Document Reviewed: 04/25/2014 Elsevier Interactive Patient Education  2018 [place] Maintenance, Female Adopting a healthy lifestyle and getting preventive care can go a long way to promote health and wellness. Talk with your health care provider about what schedule of regular examinations is right for you. This is a good chance for you to check in with your provider about disease prevention and staying healthy. In between checkups, there are plenty of things you can do on your own. Experts have done a lot of research about which lifestyle changes and preventive measures are most likely to keep you healthy. Ask your health care provider for more information. Weight and diet Eat a healthy diet  Be sure to include plenty of vegetables, fruits, low-fat dairy products, and lean protein.  Do not eat a lot of foods high in solid fats, added sugars, or salt.  Get regular exercise. This is one of the most important things you can do for your health. ? Most adults should exercise for at least 150  minutes each week. The exercise should increase your heart rate and make you sweat (moderate-intensity exercise). ? Most adults should also do strengthening exercises at least twice a week. This is in addition to the moderate-intensity exercise.  Maintain a healthy weight  Body mass index (BMI) is a measurement that can be used to identify possible weight problems. It estimates body fat based on height and weight. Your health care provider can help determine your BMI and help you achieve or maintain a healthy weight.  For females 39 years of age and older: ? A BMI below 18.5 is considered underweight. ? A BMI of 18.5 to 24.9 is normal. ? A BMI of 25 to 29.9 is considered overweight. ? A BMI of 30 and above is considered obese.  Watch levels of cholesterol and blood lipids  You should start having your blood tested for lipids and cholesterol at 81 years of age, then have this test every 5 years.  You may need to have your cholesterol levels checked more often if: ? Your lipid or cholesterol levels are high. ? You are older than 81 years of age. ? You are at high risk for heart disease.  Cancer screening Lung Cancer  Lung cancer screening is recommended for adults 25-74 years old who are at high risk for lung cancer because of a history of smoking.  A yearly low-dose CT scan of the lungs is recommended for people who: ? Currently  smoke. ? Have quit within the past 15 years. ? Have at least a 30-pack-year history of smoking. A pack year is smoking an average of one pack of cigarettes a day for 1 year.  Yearly screening should continue until it has been 15 years since you quit.  Yearly screening should stop if you develop a health problem that would prevent you from having lung cancer treatment.  Breast Cancer  Practice breast self-awareness. This means understanding how your breasts normally appear and feel.  It also means doing regular breast self-exams. Let your health care  provider know about any changes, no matter how small.  If you are in your 20s or 30s, you should have a clinical breast exam (CBE) by a health care provider every 1-3 years as part of a regular health exam.  If you are 103 or older, have a CBE every year. Also consider having a breast X-ray (mammogram) every year.  If you have a family history of breast cancer, talk to your health care provider about genetic screening.  If you are at high risk for breast cancer, talk to your health care provider about having an MRI and a mammogram every year.  Breast cancer gene (BRCA) assessment is recommended for women who have family members with BRCA-related cancers. BRCA-related cancers include: ? Breast. ? Ovarian. ? Tubal. ? Peritoneal cancers.  Results of the assessment will determine the need for genetic counseling and BRCA1 and BRCA2 testing.  Cervical Cancer Your health care provider may recommend that you be screened regularly for cancer of the pelvic organs (ovaries, uterus, and vagina). This screening involves a pelvic examination, including checking for microscopic changes to the surface of your cervix (Pap test). You may be encouraged to have this screening done every 3 years, beginning at age 30.  For women ages 59-65, health care providers may recommend pelvic exams and Pap testing every 3 years, or they may recommend the Pap and pelvic exam, combined with testing for human papilloma virus (HPV), every 5 years. Some types of HPV increase your risk of cervical cancer. Testing for HPV may also be done on women of any age with unclear Pap test results.  Other health care providers may not recommend any screening for nonpregnant women who are considered low risk for pelvic cancer and who do not have symptoms. Ask your health care provider if a screening pelvic exam is right for you.  If you have had past treatment for cervical cancer or a condition that could lead to cancer, you need Pap tests  and screening for cancer for at least 20 years after your treatment. If Pap tests have been discontinued, your risk factors (such as having a new sexual partner) need to be reassessed to determine if screening should resume. Some women have medical problems that increase the chance of getting cervical cancer. In these cases, your health care provider may recommend more frequent screening and Pap tests.  Colorectal Cancer  This type of cancer can be detected and often prevented.  Routine colorectal cancer screening usually begins at 81 years of age and continues through 81 years of age.  Your health care provider may recommend screening at an earlier age if you have risk factors for colon cancer.  Your health care provider may also recommend using home test kits to check for hidden blood in the stool.  A small camera at the end of a tube can be used to examine your colon directly (sigmoidoscopy or colonoscopy). This  is done to check for the earliest forms of colorectal cancer.  Routine screening usually begins at age 52.  Direct examination of the colon should be repeated every 5-10 years through 81 years of age. However, you may need to be screened more often if early forms of precancerous polyps or small growths are found.  Skin Cancer  Check your skin from head to toe regularly.  Tell your health care provider about any new moles or changes in moles, especially if there is a change in a mole's shape or color.  Also tell your health care provider if you have a mole that is larger than the size of a pencil eraser.  Always use sunscreen. Apply sunscreen liberally and repeatedly throughout the day.  Protect yourself by wearing long sleeves, pants, a wide-brimmed hat, and sunglasses whenever you are outside.  Heart disease, diabetes, and high blood pressure  High blood pressure causes heart disease and increases the risk of stroke. High blood pressure is more likely to develop  in: ? People who have blood pressure in the high end of the normal range (130-139/85-89 mm Hg). ? People who are overweight or obese. ? People who are African American.  If you are 6-4 years of age, have your blood pressure checked every 3-5 years. If you are 44 years of age or older, have your blood pressure checked every year. You should have your blood pressure measured twice-once when you are at a hospital or clinic, and once when you are not at a hospital or clinic. Record the average of the two measurements. To check your blood pressure when you are not at a hospital or clinic, you can use: ? An automated blood pressure machine at a pharmacy. ? A home blood pressure monitor.  If you are between 43 years and 29 years old, ask your health care provider if you should take aspirin to prevent strokes.  Have regular diabetes screenings. This involves taking a blood sample to check your fasting blood sugar level. ? If you are at a normal weight and have a low risk for diabetes, have this test once every three years after 81 years of age. ? If you are overweight and have a high risk for diabetes, consider being tested at a younger age or more often. Preventing infection Hepatitis B  If you have a higher risk for hepatitis B, you should be screened for this virus. You are considered at high risk for hepatitis B if: ? You were born in a country where hepatitis B is common. Ask your health care provider which countries are considered high risk. ? Your parents were born in a high-risk country, and you have not been immunized against hepatitis B (hepatitis B vaccine). ? You have HIV or AIDS. ? You use needles to inject street drugs. ? You live with someone who has hepatitis B. ? You have had sex with someone who has hepatitis B. ? You get hemodialysis treatment. ? You take certain medicines for conditions, including cancer, organ transplantation, and autoimmune conditions.  Hepatitis C  Blood  testing is recommended for: ? Everyone born from [birth year] through 1963. ? Anyone with known risk factors for hepatitis C.  Sexually transmitted infections (STIs)  You should be screened for sexually transmitted infections (STIs) including gonorrhea and chlamydia if: ? You are sexually active and are younger than 81 years of age. ? You are older than 82 years of age and your health care provider tells you that you  are at risk for this type of infection. ? Your sexual activity has changed since you were last screened and you are at an increased risk for chlamydia or gonorrhea. Ask your health care provider if you are at risk.  If you do not have HIV, but are at risk, it may be recommended that you take a prescription medicine daily to prevent HIV infection. This is called pre-exposure prophylaxis (PrEP). You are considered at risk if: ? You are sexually active and do not regularly use condoms or know the HIV status of your partner(s). ? You take drugs by injection. ? You are sexually active with a partner who has HIV.  Talk with your health care provider about whether you are at high risk of being infected with HIV. If you choose to begin PrEP, you should first be tested for HIV. You should then be tested every 3 months for as long as you are taking PrEP. Pregnancy  If you are premenopausal and you may become pregnant, ask your health care provider about preconception counseling.  If you may become pregnant, take 400 to 800 micrograms (mcg) of folic acid every day.  If you want to prevent pregnancy, talk to your health care provider about birth control (contraception). Osteoporosis and menopause  Osteoporosis is a disease in which the bones lose minerals and strength with aging. This can result in serious bone fractures. Your risk for osteoporosis can be identified using a bone density scan.  If you are 107 years of age or older, or if you are at risk for osteoporosis and fractures, ask your  health care provider if you should be screened.  Ask your health care provider whether you should take a calcium or vitamin D supplement to lower your risk for osteoporosis.  Menopause may have certain physical symptoms and risks.  Hormone replacement therapy may reduce some of these symptoms and risks. Talk to your health care provider about whether hormone replacement therapy is right for you. Follow these instructions at home:  Schedule regular health, dental, and eye exams.  Stay current with your immunizations.  Do not use any tobacco products including cigarettes, chewing tobacco, or electronic cigarettes.  If you are pregnant, do not drink alcohol.  If you are breastfeeding, limit how much and how often you drink alcohol.  Limit alcohol intake to no more than 1 drink per day for nonpregnant women. One drink equals 12 ounces of beer, 5 ounces of wine, or 1 ounces of hard liquor.  Do not use street drugs.  Do not share needles.  Ask your health care provider for help if you need support or information about quitting drugs.  Tell your health care provider if you often feel depressed.  Tell your health care provider if you have ever been abused or do not feel safe at home. This information is not intended to replace advice given to you by your health care provider. Make sure you discuss any questions you have with your health care provider. Document Released: 10/04/2010 Document Revised: 08/27/2015 Document Reviewed: 12/23/2014 Elsevier Interactive Patient Education  [employer].

## 2017-02-08 NOTE — Progress Notes (Signed)
Subjective:   Rebecca [name] is a 83 y.o. female who presents for Medicare Annual (Subsequent) preventive examination.  The Patient was informed that the wellness visit is to identify future health risk and educate and initiate measures that can reduce risk for increased disease through the lifespan.    Annual Wellness Assessment  Reports health as good  Looks much younger than her stated age   Preventive Screening -Counseling & Management  Medicare Annual Preventive Care Visit - Subsequent Last OV 01/24/2017  Colonoscopy 12/2014 - recheck in 5 years  Mammogram 09/2015  - June 2018  Dexa 04/2015 -3.1 with wrist; -2.5 (reclast IV one per year)  Has been on this x 5 years  Followed by Dr. Pamala Hurry  Wearing a pessary   Has tingling in her foot    Discussed tdap / expecting baby in family but didn't want to pay 120.00 today. Will check with the pharmacy but will take over the next couple of weeks as baby to be born at the end of dec   VS reviewed;   Diet  Breakfast; lunch and supper Eats vegetables and fruits   BMI 23  Exercise Tries to walk every day; walks 20 to 25 minutes Housekeeping; yard care etc   Dental -no needs   Stressors: Dr. Sarajane Jews check her iron and found it was elevated and ordered other test      Cardiac Risk Factors Addressed Hyperlipidemia - ratio 6; chol 249; HDL 40; ldl 104; Trig 491 Discussed Vytorin   Diabetes A1c 5.6; educated on Pre-diabetes as BS elevated. Also educated regarding triglycerides    Advanced Directives completed   Patient Care Team: [name], MD as PCP - General (Family Medicine) Dr. Pamala Hurry GYN   Cardiac Risk Factors include: diabetes mellitus;dyslipidemia;family history of premature cardiovascular disease;advanced age (>27men, >33 women);hypertension     Objective:     Vitals: BP 136/76   Pulse 67   Ht 5\' 3"  (1.6 m)   Wt 131 lb 5 oz (59.6 kg)   SpO2 97%   BMI 23.26 kg/m   Body mass index is 23.26  kg/m.   Tobacco Social History   Tobacco Use  Smoking Status Never Smoker  Smokeless Tobacco Never Used     Counseling given: Yes   Past Medical History:  Diagnosis Date  . Anemia   . Anxiety   . Diverticulitis   . Diverticulosis   . Gastroparesis   . GERD (gastroesophageal reflux disease)   . Glaucoma   . Hemorrhoids   . Hyperlipidemia   . Hypertension   . Hypertension   . IBS (irritable bowel syndrome)   . Rectal ulcer   . 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 Tubular adenoma of colon 05/2009   Past Surgical History:  Procedure Laterality Date  . APPENDECTOMY    . ARCUATE KERATECTOMY    . double pallital tori bone removed     . HERNIA REPAIR     umbilical  . KNEE SURGERY Bilateral   . morton's neuroma on rt foot and platar facial release    . MOUTH SURGERY     "bone shaved off roof of mouth"  . SALIVARY GLAND SURGERY     removal  . TONSILLECTOMY     Family History  Problem Relation Age of Onset  . Diabetes Mother   . Heart disease Mother   . Hyperlipidemia Mother   . Hypertension Mother   . Anuerysm Father   . Heart disease Father   .  Hypertension Father   . Migraines Daughter   . Colon polyps Sister   . Lung cancer Sister   . Colon polyps Brother   . Diabetes Brother   . Hyperlipidemia Brother   . Hypertension Brother   . Colon polyps Daughter   . Breast cancer Daughter   . Colon cancer Maternal Aunt   . Cancer Daughter        salivary  . Colon cancer Paternal Grandmother   . Heart disease Brother   . Crohn's disease Neg Hx    Social History   Substance and Sexual Activity  Sexual Activity Yes    Outpatient Encounter Medications as of 02/08/2017  Medication Sig  . ALPRAZolam (XANAX) 0.25 MG tablet take 1 tablet by mouth at bedtime if needed  . Artificial Tear Solution (GENTEAL TEARS) 0.1-0.2-0.3 % SOLN Place 2 drops into both eyes at bedtime.  . Calcium Carbonate-Vitamin D (CALCIUM-VITAMIN D) 500-200 MG-UNIT per tablet Take 1 tablet by mouth 2  (two) times daily with a meal.    . Cholecalciferol (VITAMIN D3) 2000 UNITS TABS Take 1 tablet by mouth daily.   [name] Kitchen esomeprazole (NEXIUM) 20 MG capsule Take 20 mg by mouth 2 (two) times daily before a meal.  . gabapentin (NEURONTIN) 100 MG capsule Take 2 capsules, by mouth, 3 times daily  . iron polysaccharides (POLY-IRON 150) 150 MG capsule Take 1 capsule (150 mg total) by mouth daily.  . Lactobacillus (ACIDOPHILUS PROBIOTIC) 10 MG TABS Take 10 mg by mouth 3 (three) times daily.  [name] Kitchen losartan (COZAAR) 100 MG tablet take 1 tablet by mouth once daily  . metoCLOPramide (REGLAN) 5 MG tablet take 1 tablet by mouth twice a day with meals  . ranitidine (ZANTAC) 300 MG tablet Take 1 tablet (300 mg total) by mouth at bedtime.  . simvastatin (ZOCOR) 20 MG tablet take 1 tablet by mouth at bedtime  . SYNTHROID 88 MCG tablet take 1 tablet by mouth once daily  . vitamin B-12 (CYANOCOBALAMIN) 500 MCG tablet Take 500 mcg by mouth every morning.   . vitamin C (ASCORBIC ACID) 500 MG tablet Take 500 mg by mouth daily.  . vitamin E (VITAMIN E) 400 UNIT capsule Take 400 Units by mouth daily.  . Zoledronic Acid (RECLAST IV) Inject into the vein. Once per year.  . [DISCONTINUED] azithromycin (ZITHROMAX Z-PAK) 250 MG tablet As directed   No facility-administered encounter medications on file as of 02/08/2017.     Activities of Daily Living In your present state of health, do you have any difficulty performing the following activities: 02/08/2017 09/14/2016  Hearing? Y N  Vision? N N  Difficulty concentrating or making decisions? N N  Walking or climbing stairs? N N  Dressing or bathing? N N  Doing errands, shopping? N -  Preparing Food and eating ? N -  Using the Toilet? N -  In the past six months, have you accidently leaked urine? Y -  Comment following with Dr. Pamala Hurry -  Do you have problems with loss of bowel control? N -  Managing your Medications? N -  Managing your Finances? N -  Housekeeping or  managing your Housekeeping? N -  Some recent data might be hidden    Patient Care Team: [name], MD as PCP - General (Family Medicine)    Assessment:   Exercise Activities and Dietary recommendations Current Exercise Habits: Home exercise routine, Type of exercise: walking, Time (Minutes): 30, Frequency (Times/Week): 5, Weekly Exercise (Minutes/Week): 150, Intensity:  Moderate  Goals    None    to try to walk x 30 minutes 5 times a week   Fall Risk Fall Risk  02/08/2017 01/01/2016 01/01/2016 12/02/2015 09/24/2014  Falls in the past year? No Yes Yes Yes No  Comment - - - Emmi Telephone Survey: data to providers prior to load -  Number falls in past yr: - 1 1 1  -  Comment - - - Emmi Telephone Survey Actual Response = 1 -  Injury with Fall? - - No No -  Risk for fall due to : - - - - -  Follow up - Education provided Falls evaluation completed;Education provided - -   Depression Screen PHQ 2/9 Scores 02/08/2017 01/01/2016 09/24/2014 04/26/2013  PHQ - 2 Score 0 0 0 0     Cognitive Function Ad8 score reviewed for issues:  Issues making decisions:  Less interest in hobbies / activities:  Repeats questions, stories (family complaining):  Trouble using ordinary gadgets (microwave, computer, phone):  Forgets the month or year:   Mismanaging finances:   Remembering appts:  Daily problems with thinking and/or memory: Ad8 score is=0      MMSE - Mini Mental State Exam 02/08/2017  Not completed: (No Data)    no hx of memory issues in her family . No problems with independent living     Immunization History  Administered Date(s) Administered  . H1N1 06/02/2008  . Influenza Split 12/29/2010  . Influenza Whole 04/04/2001, 02/01/2007, 12/24/2007, 12/16/2008, 01/15/2009, 01/05/2010  . Influenza, High Dose Seasonal PF 12/31/2014, 01/01/2016, 01/11/2017  . Influenza,inj,Quad PF,6+ Mos 12/19/2012, 12/25/2013  . Pneumococcal Polysaccharide-23 03/05/1999  . Td  04/05/1999, 07/28/2008  . Zoster 10/24/2007  . Zoster Recombinat (Shingrix) 08/04/2016, 10/12/2016   Screening Tests Health Maintenance  Topic Date Due  . TETANUS/TDAP  07/29/2018  . INFLUENZA VACCINE  Completed  . DEXA SCAN  Completed  . MAMMOGRAM  Discontinued      Plan:     PCP Notes   Health Maintenance Colonoscopy 12/2014 - recheck in 5 years  Mammogram 09/2015  - June 2018  Dexa 04/2015 -3.1 with wrist; -2.5 (reclast IV one per year)  Has been on this x 5 years  Followed by Dr. Pamala Hurry    Abnormal Screens  Triglycerides - 48  Educated regarding cutting back on sugar and fat Has been eating chocolate candy  Referrals  None  Patient concerns; Recent ferritin level was elevated; Dr. Sarajane Jews ran more test but she is not sure if there is a problem.  (he seen her for an acute visit and fup on elevated ferritin. She is taking iron)   Is there a concern? Please advise    Nurse Concerns; As noted   Next PCP apt     I have personally reviewed and noted the following in the patient's chart:   . Medical and social history . Use of alcohol, tobacco or illicit drugs  . Current medications and supplements . Functional ability and status . Nutritional status . Physical activity . Advanced directives . List of other physicians . Hospitalizations, surgeries, and ER visits in previous 12 months . Vitals . Screenings to include cognitive, depression, and falls . Referrals and appointments  In addition, I have reviewed and discussed with patient certain preventive protocols, quality metrics, and best practice recommendations. A written personalized care plan for preventive services as well as general preventive health recommendations were provided to patient.     JSHFW,YOVZC, RN  02/08/2017  Above  reviewed.  She had some lymphocytosis and atypical lymphocytes on recent CBC and smear.  Ferritin can be elevated as an acute phase reactant and is non-specific.  She has  already been referred to hematology for further evaluation.  [name] MD [facility]

## 2017-02-10 ENCOUNTER — Telehealth: Payer: Self-pay

## 2017-02-10 ENCOUNTER — Encounter (HOSPITAL_COMMUNITY): Payer: Medicare Other

## 2017-02-10 DIAGNOSIS — N952 Postmenopausal atrophic vaginitis: Secondary | ICD-10-CM | POA: Diagnosis not present

## 2017-02-10 DIAGNOSIS — N8111 Cystocele, midline: Secondary | ICD-10-CM | POA: Diagnosis not present

## 2017-02-10 DIAGNOSIS — E611 Iron deficiency: Secondary | ICD-10-CM

## 2017-02-10 DIAGNOSIS — N898 Other specified noninflammatory disorders of vagina: Secondary | ICD-10-CM | POA: Diagnosis not present

## 2017-02-10 DIAGNOSIS — N816 Rectocele: Secondary | ICD-10-CM | POA: Diagnosis not present

## 2017-02-10 DIAGNOSIS — N393 Stress incontinence (female) (male): Secondary | ICD-10-CM | POA: Diagnosis not present

## 2017-02-10 DIAGNOSIS — B372 Candidiasis of skin and nail: Secondary | ICD-10-CM | POA: Diagnosis not present

## 2017-02-10 NOTE — Telephone Encounter (Signed)
I agree with repeating ferritin. Let's plan repeat ferritin level in 2-3 weeks before referring to hematology

## 2017-02-10 NOTE — Telephone Encounter (Signed)
The patient stated she is having a steriod injection to her back on 11/19 and would like to postpone referral fup until post injection.  Also, her niece is a PA who worked in oncology  and recommended she may have had a virus during ferritin draw and labs and recommended repeating the lab prior to a referral to hematology  Please Advise   Rebecca [name]

## 2017-02-14 ENCOUNTER — Encounter (HOSPITAL_COMMUNITY): Payer: Medicare Other

## 2017-02-16 ENCOUNTER — Encounter: Payer: Self-pay | Admitting: Family Medicine

## 2017-02-16 NOTE — Telephone Encounter (Signed)
Outreach to Ms. Meinhart  Stated she had been trying to reach someone. Apparently my direct number was off by one numeral and thought she had left messages that I had not rec'd.   Relayed reply from  Dr. Elease Hashimoto  that she can have a ferritin drawn in 2 to 3 weeks.  Apologized for the delay, but stated I do not work on Monday and had cases and apologize for the delay and the incorrect number. Let her know Dr. Elease Hashimoto responded.

## 2017-02-17 NOTE — Telephone Encounter (Signed)
Spoke with patient and she will come back to the office for labs.  thanks

## 2017-02-20 DIAGNOSIS — M5136 Other intervertebral disc degeneration, lumbar region: Secondary | ICD-10-CM | POA: Diagnosis not present

## 2017-02-20 DIAGNOSIS — M5416 Radiculopathy, lumbar region: Secondary | ICD-10-CM | POA: Diagnosis not present

## 2017-02-20 DIAGNOSIS — M4726 Other spondylosis with radiculopathy, lumbar region: Secondary | ICD-10-CM | POA: Diagnosis not present

## 2017-02-20 DIAGNOSIS — M48061 Spinal stenosis, lumbar region without neurogenic claudication: Secondary | ICD-10-CM | POA: Diagnosis not present

## 2017-03-15 ENCOUNTER — Other Ambulatory Visit: Payer: Self-pay | Admitting: Family Medicine

## 2017-03-15 NOTE — Telephone Encounter (Signed)
Last refill 08/31/16 and last office visit 01/24/17.  Okay to fill?

## 2017-03-15 NOTE — Telephone Encounter (Signed)
Refill with 5 additional refills. 

## 2017-03-29 ENCOUNTER — Telehealth: Payer: Self-pay | Admitting: Family Medicine

## 2017-03-29 DIAGNOSIS — D72820 Lymphocytosis (symptomatic): Secondary | ICD-10-CM

## 2017-03-29 NOTE — Telephone Encounter (Signed)
Copied from [place] [id number]. Topic: General - Other >> Mar 29, 2017  1:58 PM Lennox Solders wrote: Reason for CRM: Pt is calling and does not want her ferritin recheck. Pt would like [facility] recheck please put  order in system. Pt would like to have [facility] check this friday

## 2017-03-30 NOTE — Telephone Encounter (Signed)
Repeat CBC with dif OK to order  (dx=lymphocytosis)

## 2017-03-31 ENCOUNTER — Other Ambulatory Visit (INDEPENDENT_AMBULATORY_CARE_PROVIDER_SITE_OTHER): Payer: Medicare Other

## 2017-03-31 DIAGNOSIS — D72820 Lymphocytosis (symptomatic): Secondary | ICD-10-CM | POA: Diagnosis not present

## 2017-03-31 DIAGNOSIS — E611 Iron deficiency: Secondary | ICD-10-CM | POA: Diagnosis not present

## 2017-03-31 LAB — CBC WITH DIFFERENTIAL/PLATELET
Basophils Absolute: 0.1 10*3/uL (ref 0.0–0.1)
Basophils Relative: 0.7 % (ref 0.0–3.0)
Eosinophils Absolute: 0.1 10*3/uL (ref 0.0–0.7)
Eosinophils Relative: 1.2 % (ref 0.0–5.0)
HCT: 38.8 % (ref 36.0–46.0)
Hemoglobin: 13.1 g/dL (ref 12.0–15.0)
Lymphocytes Relative: 56.2 % — ABNORMAL HIGH (ref 12.0–46.0)
Lymphs Abs: 5 10*3/uL — ABNORMAL HIGH (ref 0.7–4.0)
MCHC: 33.7 g/dL (ref 30.0–36.0)
MCV: 101.4 fl — ABNORMAL HIGH (ref 78.0–100.0)
Monocytes Absolute: 0.5 10*3/uL (ref 0.1–1.0)
Monocytes Relative: 5.5 % (ref 3.0–12.0)
Neutro Abs: 3.2 10*3/uL (ref 1.4–7.7)
Neutrophils Relative %: 36.4 % — ABNORMAL LOW (ref 43.0–77.0)
Platelets: 253 10*3/uL (ref 150.0–400.0)
RBC: 3.83 Mil/uL — ABNORMAL LOW (ref 3.87–5.11)
RDW: 13.7 % (ref 11.5–15.5)
WBC: 8.9 10*3/uL (ref 4.0–10.5)

## 2017-03-31 LAB — FERRITIN: Ferritin: 385.8 ng/mL — ABNORMAL HIGH (ref 10.0–291.0)

## 2017-03-31 NOTE — Telephone Encounter (Signed)
I called the pt and informed her of the message below.  Lab appt scheduled for today at 2pm.

## 2017-04-07 ENCOUNTER — Encounter: Payer: Self-pay | Admitting: Family Medicine

## 2017-04-07 ENCOUNTER — Ambulatory Visit (INDEPENDENT_AMBULATORY_CARE_PROVIDER_SITE_OTHER): Payer: Medicare Other | Admitting: Family Medicine

## 2017-04-07 VITALS — BP 130/60 | HR 66 | Temp 97.8°F | Wt 130.0 lb

## 2017-04-07 DIAGNOSIS — D72829 Elevated white blood cell count, unspecified: Secondary | ICD-10-CM | POA: Diagnosis not present

## 2017-04-07 NOTE — Progress Notes (Signed)
Subjective:     Patient ID: Rebecca [name], female   DOB: 11/15/1932, 84 y.o.   MRN: [id number]  HPI Patient is here to follow-up some lab work. She was here in October with respiratory illness with cough. She had lab work done showed elevated ferritin 479. White count was normal but she had some lymphocytosis. She was told to get follow-up labs. She had these a week ago with ferritin 385. White count, hemoglobin, platelets all normal but with some persistent lymphocytosis with 56% lymphocytes. She feels better overall. Cough is resolved but she states her illness took weeks to resolve. No fever. No chills. No night sweats. No appetite or weight changes.  She has remote history of iron deficiency started on iron by another physician years ago. She remains on iron daily. No family history of hemochromatosis. No history of any known GI losses.  Past Medical History:  Diagnosis Date  . Anemia   . Anxiety   . Diverticulitis   . Diverticulosis   . Gastroparesis   . GERD (gastroesophageal reflux disease)   . Glaucoma   . Hemorrhoids   . Hyperlipidemia   . Hypertension   . Hypertension   . IBS (irritable bowel syndrome)   . Rectal ulcer   . 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 Tubular adenoma of colon 05/2009   Past Surgical History:  Procedure Laterality Date  . APPENDECTOMY    . ARCUATE KERATECTOMY    . double pallital tori bone removed     . HERNIA REPAIR     umbilical  . KNEE SURGERY Bilateral   . morton's neuroma on rt foot and platar facial release    . MOUTH SURGERY     "bone shaved off roof of mouth"  . SALIVARY GLAND SURGERY     removal  . TONSILLECTOMY      reports that  has never smoked. she has never used smokeless tobacco. She reports that she drinks alcohol. She reports that she does not use drugs. family history includes Anuerysm in her father; Breast cancer in her daughter; Cancer in her daughter; Colon cancer in her maternal aunt and paternal grandmother; Colon polyps in  her brother, daughter, and sister; Diabetes in her brother and mother; Heart disease in her brother, father, and mother; Hyperlipidemia in her brother and mother; Hypertension in her brother, father, and mother; Lung cancer in her sister; Migraines in her daughter. Allergies  Allergen Reactions  . Levaquin [Levofloxacin] Other (See Comments)    Very nauseous and shakey  . Sulfamethoxazole-Trimethoprim Nausea Only    shakey  . Lactose Intolerance (Gi) Diarrhea and Other (See Comments)    Stomach hurts   . Tramadol Hcl Nausea Only and Palpitations  . Augmentin [Amoxicillin-Pot Clavulanate] Diarrhea  . Ciprofloxacin Nausea Only    REACTION: Thrush, Nausea, Shakey  . Pneumococcal Vaccine Polyvalent     REACTION: RED AND RAISED RASH ON ARM  . Adhesive [Tape] Rash  . Codeine Nausea Only     Review of Systems  Constitutional: Negative for appetite change, chills, fever and unexpected weight change.  Respiratory: Negative for cough and shortness of breath.   Cardiovascular: Negative for chest pain.  Gastrointestinal: Negative for abdominal pain.  Hematological: Negative for adenopathy. Does not bruise/bleed easily.       Objective:   Physical Exam  Constitutional: She appears well-developed and well-nourished.  Neck: Neck supple. No thyromegaly present.  Cardiovascular: Normal rate and regular rhythm.  Pulmonary/Chest: Effort normal and breath sounds normal.  No respiratory distress. She has no wheezes. She has no rales.  Musculoskeletal: She exhibits no edema.  Lymphadenopathy:    She has no cervical adenopathy.       Assessment:     Patient has history recent lymphocytosis with overall normal white count. Probable recent viral illness. Elevated ferritin which is likely acute phase reactant    Plan:     -Follow-up in 3 months. Consider repeat CBC then. -Recommend she stop iron supplement at this point and will reassess as above at follow-up  [name] MD [facility]  [facility] Care at [facility]

## 2017-04-07 NOTE — Patient Instructions (Signed)
Leave off the iron for now Let's plan 3 month follow up and will recheck CBC and iron then.

## 2017-04-11 ENCOUNTER — Other Ambulatory Visit: Payer: Self-pay | Admitting: Family Medicine

## 2017-05-01 DIAGNOSIS — M810 Age-related osteoporosis without current pathological fracture: Secondary | ICD-10-CM | POA: Diagnosis not present

## 2017-05-01 LAB — HM DEXA SCAN

## 2017-05-09 ENCOUNTER — Encounter: Payer: Self-pay | Admitting: *Deleted

## 2017-05-09 ENCOUNTER — Encounter: Payer: Self-pay | Admitting: Family Medicine

## 2017-05-09 ENCOUNTER — Other Ambulatory Visit: Payer: Self-pay | Admitting: Family Medicine

## 2017-05-09 DIAGNOSIS — H9190 Unspecified hearing loss, unspecified ear: Secondary | ICD-10-CM

## 2017-05-09 NOTE — Telephone Encounter (Signed)
Medication sent in electronically.  

## 2017-05-16 ENCOUNTER — Encounter (INDEPENDENT_AMBULATORY_CARE_PROVIDER_SITE_OTHER): Payer: Self-pay | Admitting: Orthopaedic Surgery

## 2017-05-16 ENCOUNTER — Ambulatory Visit (INDEPENDENT_AMBULATORY_CARE_PROVIDER_SITE_OTHER): Payer: Medicare Other | Admitting: Orthopaedic Surgery

## 2017-05-16 VITALS — BP 145/70 | HR 62 | Ht 62.0 in | Wt 125.0 lb

## 2017-05-16 DIAGNOSIS — G8929 Other chronic pain: Secondary | ICD-10-CM

## 2017-05-16 DIAGNOSIS — M545 Low back pain: Secondary | ICD-10-CM | POA: Diagnosis not present

## 2017-05-16 MED ORDER — LIDOCAINE HCL 1 % IJ SOLN
2.0000 mL | INTRAMUSCULAR | Status: AC | PRN
  Administered 2017-05-16: 2 mL

## 2017-05-16 MED ORDER — METHYLPREDNISOLONE ACETATE 40 MG/ML IJ SUSP
40.0000 mg | INTRAMUSCULAR | Status: AC | PRN
  Administered 2017-05-16: 40 mg via INTRAMUSCULAR

## 2017-05-16 NOTE — Progress Notes (Signed)
Office Visit Note   Patient: Rebecca [name]           Date of Birth: [birth year]           MRN: [id number] Visit Date: 05/16/2017              Requested by: Rebecca Post, MD [place], [place] [id number] PCP: Rebecca Post, MD   Assessment & Plan: Visit Diagnoses:  1. Chronic right-sided low back pain without sciatica     Plan: Localized areas of tenderness sciatica trigger point tenderness in the right parasacral region. No masses. Probably referred from lumbar spine. Will inject area of trigger point tenderness and monitor her response. If no relief would consider further diagnostic testing or referral back to Rebecca [name]  Follow-Up Instructions: Return if symptoms worsen or fail to improve.   Orders:  Orders Placed This Encounter  Procedures  . Trigger Point Inj   No orders of the defined types were placed in this encounter.     Procedures: Trigger Point Inj Date/Time: 05/16/2017 2:44 PM Performed by: Rebecca Balding, MD Authorized by: Rebecca Balding, MD   Total # of Trigger Points:  1 Location: back   Medications #1:  2 mL lidocaine 1 %; 40 mg methylPREDNISolone acetate 40 MG/ML     Clinical Data: No additional findings.   Subjective: Chief Complaint  Patient presents with  . Right Hip - Pain    Right hip pain for 2-3 weeks, stopped walking to help.  Rebecca [name] is a chronic problem with her lumbar spine. I've seen her several years ago and has been seeing Rebecca [name] more recently. She's had MRI scan revealing degenerative changes diffusely more pronounced at L4-5 and L5-S1. At those 2 levels there was severe right foraminal narrowing due to disc and endplate spurring. There was no significant difference in the results of the scan from 2018 compared to those 2011. She's not having any numbness or tingling. It's been 3 months since she had a cortisone injection through Rebecca [name] office. I also think this particular pain  is different than the pain she's had in the past. No numbness or tingling no groin pain  HPI  Review of Systems  Constitutional: Negative.  Negative for chills, fatigue and fever.  HENT: Negative.  Negative for hearing loss and tinnitus.   Eyes: Negative.  Negative for itching.  Respiratory: Negative.  Negative for chest tightness and shortness of breath.   Cardiovascular: Negative.  Negative for chest pain, palpitations and leg swelling.  Gastrointestinal: Negative.  Negative for blood in stool, constipation and diarrhea.  Endocrine: Negative for polyuria.  Genitourinary: Negative for dysuria.  Musculoskeletal: Negative.  Negative for back pain, joint swelling, neck pain and neck stiffness.  Allergic/Immunologic: Negative for immunocompromised state.  Neurological: Negative.  Negative for dizziness, numbness and headaches.  Hematological: Does not bruise/bleed easily.  Psychiatric/Behavioral: Negative.  Negative for sleep disturbance. The patient is not nervous/anxious.      Objective: Vital Signs: BP (!) 145/70   Pulse 62   Ht 5\' 2"  (1.575 m)   Wt 125 lb (56.7 kg)   BMI 22.86 kg/m   Physical Exam  Ortho Exam awake alert and oriented 3. Comfortable sitting. No pain with range of motion of right hip. Straight leg raise negative. No percussible tenderness of the lumbar spine. Does have an area of tenderness in the parasacral region on the right. No masses. No skin changes.  Specialty Comments:  No specialty comments available.  Imaging: No results found.   PMFS History: Patient Active Problem List   Diagnosis Date Noted  . Cellulitis of left upper extremity 10/15/2016  . 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 Diverticulosis of colon without hemorrhage 02/04/2015  . Unspecified vitamin D deficiency 12/25/2013  . 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 Atherosclerosis 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 04/20/2011  .  SPINAL STENOSIS, LUMBAR 03/09/2010  . OTHER OSTEOPOROSIS 03/09/2010  . SCOLIOSIS, LUMBAR SPINE 03/09/2010  . MIGRAINE, OPHTHALMIC 10/29/2009  . LUMBAR RADICULOPATHY, ATYPICAL 10/29/2009  . POLYNEUROPATHY OTHER DISEASES CLASSIFIED ELSW 09/22/2009  . UNSPECIFIED PERIPHERAL VASCULAR DISEASE 09/22/2009  . ADENOMATOUS COLONIC POLYP 06/10/2009  . ANEMIA, IRON DEFICIENCY 05/06/2009  . CAROTID ARTERY DISEASE 07/28/2008  . GASTROPARESIS 07/22/2008  . Allergic rhinitis 03/20/2008  . DYSPHAGIA 03/12/2008  . GANGLION OF TENDON SHEATH 08/17/2007  . Hypothyroidism 11/15/2006  . Hyperlipidemia 11/07/2006  . ADVEF, DRUG/MEDICINAL/BIOLOGICAL SUBST NOS 11/07/2006  . Essential hypertension 10/18/2006  . GERD 10/18/2006  . DIVERTICULOSIS, COLON 10/18/2006   Past Medical History:  Diagnosis Date  . Anemia   . Anxiety   . Diverticulitis   . Diverticulosis   . Gastroparesis   . GERD (gastroesophageal reflux disease)   . Glaucoma   . Hemorrhoids   . Hyperlipidemia   . Hypertension   . Hypertension   . IBS (irritable bowel syndrome)   . Rectal ulcer   . Segmental colitis ([place])   . Tubular adenoma of colon 05/2009    Family History  Problem Relation Age of Onset  . Diabetes Mother   . Heart disease Mother   . Hyperlipidemia Mother   . Hypertension Mother   . Anuerysm Father   . Heart disease Father   . Hypertension Father   . Migraines Daughter   . Colon polyps Sister   . Lung cancer Sister   . Colon polyps Brother   . Diabetes Brother   . Hyperlipidemia Brother   . Hypertension Brother   . Colon polyps Daughter   . Breast cancer Daughter   . Colon cancer Maternal Aunt   . Cancer Daughter        salivary  . Colon cancer Paternal Grandmother   . Heart disease Brother   . Crohn's disease Neg Hx     Past Surgical History:  Procedure Laterality Date  . APPENDECTOMY    . ARCUATE KERATECTOMY    . double pallital tori bone removed     . HERNIA REPAIR     umbilical  . KNEE SURGERY  Bilateral   . morton's neuroma on rt foot and platar facial release    . MOUTH SURGERY     "bone shaved off roof of mouth"  . SALIVARY GLAND SURGERY     removal  . TONSILLECTOMY     Social History   Occupational History  . Not on file  Tobacco Use  . Smoking status: Never Smoker  . Smokeless tobacco: Never Used  Substance and Sexual Activity  . Alcohol use: Yes    Alcohol/week: 0.0 oz    Comment: rare  . Drug use: No  . Sexual activity: Yes

## 2017-05-18 DIAGNOSIS — N8111 Cystocele, midline: Secondary | ICD-10-CM | POA: Diagnosis not present

## 2017-05-18 DIAGNOSIS — N952 Postmenopausal atrophic vaginitis: Secondary | ICD-10-CM | POA: Diagnosis not present

## 2017-05-18 DIAGNOSIS — M810 Age-related osteoporosis without current pathological fracture: Secondary | ICD-10-CM | POA: Diagnosis not present

## 2017-05-18 DIAGNOSIS — N816 Rectocele: Secondary | ICD-10-CM | POA: Diagnosis not present

## 2017-05-26 DIAGNOSIS — N898 Other specified noninflammatory disorders of vagina: Secondary | ICD-10-CM | POA: Diagnosis not present

## 2017-05-26 DIAGNOSIS — B373 Candidiasis of vulva and vagina: Secondary | ICD-10-CM | POA: Diagnosis not present

## 2017-06-26 ENCOUNTER — Encounter: Payer: Self-pay | Admitting: Family Medicine

## 2017-06-26 DIAGNOSIS — H903 Sensorineural hearing loss, bilateral: Secondary | ICD-10-CM | POA: Diagnosis not present

## 2017-07-04 ENCOUNTER — Encounter: Payer: Self-pay | Admitting: Family Medicine

## 2017-07-04 ENCOUNTER — Ambulatory Visit (INDEPENDENT_AMBULATORY_CARE_PROVIDER_SITE_OTHER): Payer: Medicare Other | Admitting: Family Medicine

## 2017-07-04 VITALS — BP 120/80 | HR 87 | Temp 97.8°F | Wt 131.1 lb

## 2017-07-04 DIAGNOSIS — R7989 Other specified abnormal findings of blood chemistry: Secondary | ICD-10-CM

## 2017-07-04 DIAGNOSIS — I10 Essential (primary) hypertension: Secondary | ICD-10-CM

## 2017-07-04 DIAGNOSIS — E038 Other specified hypothyroidism: Secondary | ICD-10-CM | POA: Diagnosis not present

## 2017-07-04 DIAGNOSIS — E785 Hyperlipidemia, unspecified: Secondary | ICD-10-CM | POA: Diagnosis not present

## 2017-07-04 DIAGNOSIS — D72820 Lymphocytosis (symptomatic): Secondary | ICD-10-CM

## 2017-07-04 LAB — CBC WITH DIFFERENTIAL/PLATELET
Basophils Absolute: 0.1 10*3/uL (ref 0.0–0.1)
Basophils Relative: 1 % (ref 0.0–3.0)
Eosinophils Absolute: 0.1 10*3/uL (ref 0.0–0.7)
Eosinophils Relative: 1.5 % (ref 0.0–5.0)
HCT: 37.7 % (ref 36.0–46.0)
Hemoglobin: 12.8 g/dL (ref 12.0–15.0)
Lymphocytes Relative: 55.8 % — ABNORMAL HIGH (ref 12.0–46.0)
Lymphs Abs: 5.7 10*3/uL — ABNORMAL HIGH (ref 0.7–4.0)
MCHC: 34 g/dL (ref 30.0–36.0)
MCV: 98.7 fl (ref 78.0–100.0)
Monocytes Absolute: 0.7 10*3/uL (ref 0.1–1.0)
Monocytes Relative: 6.6 % (ref 3.0–12.0)
Neutro Abs: 3.6 10*3/uL (ref 1.4–7.7)
Neutrophils Relative %: 35.1 % — ABNORMAL LOW (ref 43.0–77.0)
Platelets: 189 10*3/uL (ref 150.0–400.0)
RBC: 3.82 Mil/uL — ABNORMAL LOW (ref 3.87–5.11)
RDW: 12.8 % (ref 11.5–15.5)
WBC: 10.2 10*3/uL (ref 4.0–10.5)

## 2017-07-04 LAB — FERRITIN: Ferritin: 380.4 ng/mL — ABNORMAL HIGH (ref 10.0–291.0)

## 2017-07-04 NOTE — Progress Notes (Signed)
Subjective:     Patient ID: Rebecca [name], female   DOB: December 26, 1932, 84 y.o.   MRN: [id number]  HPI Patient for medical follow-up. She was here back in the Fall following a viral type illness with several week history of fairly severe cough. She had lab work with lymphocytosis and elevated ferritin level. We explained elevated ferritin might have been acute phase reactant. We had one follow-up CBC which showed her percent lymphocytes and ferritin had come down. She states she has long history of iron deficiency. Apparently may have had some absorption issues the past. She states that years ago she had 2 different iron transfusions. She's not aware of any chronic ongoing GI losses.  Denies any cough at this time. No dizziness. No falls.  Other chronic problems  include history of hypertension, hypothyroidism, hyperlipidemia, GERD. Medications reviewed. Compliant with all. No recent falls.   Past Medical History:  Diagnosis Date  . Anemia   . Anxiety   . Diverticulitis   . Diverticulosis   . Gastroparesis   . GERD (gastroesophageal reflux disease)   . Glaucoma   . Hemorrhoids   . Hyperlipidemia   . Hypertension   . Hypertension   . IBS (irritable bowel syndrome)   . Rectal ulcer   . Segmental colitis ([place])   . Tubular adenoma of colon 05/2009   Past Surgical History:  Procedure Laterality Date  . APPENDECTOMY    . ARCUATE KERATECTOMY    . double pallital tori bone removed     . HERNIA REPAIR     umbilical  . KNEE SURGERY Bilateral   . morton's neuroma on rt foot and platar facial release    . MOUTH SURGERY     "bone shaved off roof of mouth"  . SALIVARY GLAND SURGERY     removal  . TONSILLECTOMY      reports that she has never smoked. She has never used smokeless tobacco. She reports that she drinks alcohol. She reports that she does not use drugs. family history includes Anuerysm in her father; Breast cancer in her daughter; Cancer in her daughter; Colon cancer in her  maternal aunt and paternal grandmother; Colon polyps in her brother, daughter, and sister; Diabetes in her brother and mother; Heart disease in her brother, father, and mother; Hyperlipidemia in her brother and mother; Hypertension in her brother, father, and mother; Lung cancer in her sister; Migraines in her daughter. Allergies  Allergen Reactions  . Levaquin [Levofloxacin] Other (See Comments)    Very nauseous and shakey  . Sulfamethoxazole-Trimethoprim Nausea Only    shakey  . Lactose Intolerance (Gi) Diarrhea and Other (See Comments)    Stomach hurts   . Tramadol Hcl Nausea Only and Palpitations  . Augmentin [Amoxicillin-Pot Clavulanate] Diarrhea  . Ciprofloxacin Nausea Only    REACTION: Thrush, Nausea, Shakey  . Pneumococcal Vaccine Polyvalent     REACTION: RED AND RAISED RASH ON ARM  . Adhesive [Tape] Rash  . Codeine Nausea Only     Review of Systems  Constitutional: Negative for appetite change, chills, fever and unexpected weight change.  HENT: Negative for trouble swallowing.   Respiratory: Negative for cough, shortness of breath and wheezing.   Cardiovascular: Negative for chest pain, palpitations and leg swelling.  Gastrointestinal: Negative for abdominal pain and blood in stool.  Genitourinary: Negative for dysuria.  Neurological: Negative for dizziness and weakness.  Psychiatric/Behavioral: Negative for dysphoric mood.       Objective:   Physical Exam  Constitutional:  She is oriented to person, place, and time. She appears well-developed and well-nourished.  Neck: Neck supple. No thyromegaly present.  Cardiovascular: Normal rate and regular rhythm.  Pulmonary/Chest: Effort normal and breath sounds normal. No respiratory distress. She has no wheezes. She has no rales.  Musculoskeletal: She exhibits no edema.  Neurological: She is alert and oriented to person, place, and time.       Assessment:     #1 recent lymphocytosis and elevated ferritin-? Related to  recent respiratory illness  #2 hypertension stable and at goal  #3 hypothyroidism  #4 hyperlipidemia    Plan:     -Recheck CBC with differential and ferritin level -Continue current medications -Routine follow-up in 6 months and sooner as needed  [name] MD [facility] at [facility]

## 2017-07-05 ENCOUNTER — Ambulatory Visit: Payer: Medicare Other | Admitting: Family Medicine

## 2017-07-07 ENCOUNTER — Ambulatory Visit: Payer: Medicare Other | Admitting: Family Medicine

## 2017-07-10 DIAGNOSIS — D1801 Hemangioma of skin and subcutaneous tissue: Secondary | ICD-10-CM | POA: Diagnosis not present

## 2017-07-10 DIAGNOSIS — D2271 Melanocytic nevi of right lower limb, including hip: Secondary | ICD-10-CM | POA: Diagnosis not present

## 2017-07-10 DIAGNOSIS — Z85828 Personal history of other malignant neoplasm of skin: Secondary | ICD-10-CM | POA: Diagnosis not present

## 2017-07-10 DIAGNOSIS — D2272 Melanocytic nevi of left lower limb, including hip: Secondary | ICD-10-CM | POA: Diagnosis not present

## 2017-07-10 DIAGNOSIS — L821 Other seborrheic keratosis: Secondary | ICD-10-CM | POA: Diagnosis not present

## 2017-07-12 ENCOUNTER — Ambulatory Visit (HOSPITAL_COMMUNITY)
Admission: RE | Admit: 2017-07-12 | Discharge: 2017-07-12 | Disposition: A | Discharge status: Home / Self Care | Payer: Medicare Other | Source: Ambulatory Visit | Attending: Cardiovascular Disease | Admitting: Cardiovascular Disease

## 2017-07-12 ENCOUNTER — Other Ambulatory Visit: Payer: Self-pay | Admitting: Family Medicine

## 2017-07-12 DIAGNOSIS — E785 Hyperlipidemia, unspecified: Secondary | ICD-10-CM | POA: Insufficient documentation

## 2017-07-12 DIAGNOSIS — I779 Disorder of arteries and arterioles, unspecified: Secondary | ICD-10-CM | POA: Diagnosis not present

## 2017-07-12 DIAGNOSIS — I10 Essential (primary) hypertension: Secondary | ICD-10-CM | POA: Insufficient documentation

## 2017-07-12 DIAGNOSIS — I739 Peripheral vascular disease, unspecified: Secondary | ICD-10-CM

## 2017-07-12 DIAGNOSIS — I6523 Occlusion and stenosis of bilateral carotid arteries: Secondary | ICD-10-CM | POA: Diagnosis not present

## 2017-07-20 DIAGNOSIS — H26491 Other secondary cataract, right eye: Secondary | ICD-10-CM | POA: Diagnosis not present

## 2017-08-09 ENCOUNTER — Encounter: Payer: Self-pay | Admitting: Family Medicine

## 2017-08-09 ENCOUNTER — Ambulatory Visit (INDEPENDENT_AMBULATORY_CARE_PROVIDER_SITE_OTHER): Payer: Medicare Other | Admitting: Family Medicine

## 2017-08-09 VITALS — BP 110/80 | HR 69 | Temp 98.2°F | Wt 128.6 lb

## 2017-08-09 DIAGNOSIS — S86891A Other injury of other muscle(s) and tendon(s) at lower leg level, right leg, initial encounter: Secondary | ICD-10-CM | POA: Diagnosis not present

## 2017-08-09 DIAGNOSIS — R143 Flatulence: Secondary | ICD-10-CM | POA: Diagnosis not present

## 2017-08-09 MED ORDER — DICLOFENAC SODIUM 1 % TD GEL: 2.0000 g | Freq: Four times a day (QID) | TRANSDERMAL | 1 refills | Status: AC

## 2017-08-09 NOTE — Patient Instructions (Signed)
Shin Splints  Shin splints is a painful condition that is felt on the bone that is located in the front of the lower leg (tibia or shin bone) or in the muscles on either side of the bone. This condition happens when physical activities lead to inflammation of the muscles, tendons, and the thin layer that covers the shin bone. It may result from participating in sports or other demanding exercise.  What are the causes?  This condition may be caused by:  · Overuse of muscles.  · Repetitive activities.  · Flat feet or rigid arches.    Activities that could contribute to shin splints include:  · Having a sudden increase in exercise time.  · Starting a new, demanding activity.  · Running up hills or long distances.  · Playing sports that involve sudden starts and stops.  · Using a poor warm-up.  · Wearing old or worn-out shoes.    What are the signs or symptoms?  Symptoms of this condition include:  · Pain on the front of the lower leg.  · Pain while exercising or at rest.    How is this diagnosed?  This condition may be diagnosed based on a physical exam and the history of your symptoms. Your health care provider may observe you as you walk or run. You may also have X-rays or other imaging tests to rule out other problems that could cause lower leg pain, such as a stress fracture.  How is this treated?  Treatment for this condition may depend on your age, history, health, and how bad the pain is. Most cases of shin splints can be managed by doing one or more of the following:  · Resting.  · Reducing the length and intensity of your exercise.  · Stopping the activity that causes shin pain.  · Taking medicines to control the inflammation.  · Icing, massaging, stretching, and strengthening the affected area.  · Wearing shoes that have rigid heels, shock absorption, and a good arch support.    Follow these instructions at home:  Injury care  · If directed, apply ice to the injured area:  ? Put ice in a plastic bag.  ? Place  a towel between your skin and the bag.  ? Leave the ice on for 20 minutes, 2-3 times a day.  · Massage, stretch, and strengthen the affected area as directed by your health care provider.  Activity  · Rest as needed. Return to activity gradually as directed by your health care provider.  · Restart your exercise sessions with non-weight-bearing exercises, such as cycling or swimming.  · Stop running if the pain returns.  · Warm up properly before exercising.  · Run on a surface that is level and fairly firm.  · Gradually change the intensity of an exercise.  · If you increase your running distance, add only 5-10% to your distance each week. This means that if you are running 5 miles this week, you should only increase your run by ¼-½ mile for next week.  General instructions  · Take medicines only as directed by your health care provider.  · Wear shoes that have rigid heels, shock absorption, and a good arch support.  · Change your athletic shoes every 6 months, or every 350-450 miles.  Contact a health care provider if:  · Your symptoms continue or they worsen even after treatment.  · The location, intensity, or type of pain changes over time.  · You have   swelling in your lower leg that gets worse.  · Your shin becomes red and feels warm.  Get help right away if:  · You have severe pain.  · You have trouble walking.  This information is not intended to replace advice given to you by your health care provider. Make sure you discuss any questions you have with your health care provider.  Document Released: 03/18/2000 Document Revised: 11/14/2015 Document Reviewed: 03/17/2014  Elsevier Interactive Patient Education © 2018 [employer]

## 2017-08-09 NOTE — Progress Notes (Signed)
Subjective:     Patient ID: Rebecca [name], female   DOB: 03-Jan-1933, 84 y.o.   MRN: [id number]  HPI Patient seen for the following couple of items  New complaint of right leg pain anterior. Started about a month ago. She first noticed after walking longer than usual shopping one day. She had some soreness mostly along the anterior medial aspect of the tibia. No visible bruising or redness. She has some pain with dorsiflexion. No calf pain. She's not tried any icing. Occasional Motrin with minimal relief. She does have some chronic back pain but this pain is different.  Second issue is increased "gas" after eating. She has discussed this with gastroenterologist in the past and they've given her dietary sheet with some suggestions. She's tried over-the-counter things like Gas-X without improvement. Denies any nausea or vomiting. No abdominal pain. Good appetite.  Past Medical History:  Diagnosis Date  . Anemia   . Anxiety   . Diverticulitis   . Diverticulosis   . Gastroparesis   . GERD (gastroesophageal reflux disease)   . Glaucoma   . Hemorrhoids   . Hyperlipidemia   . Hypertension   . Hypertension   . IBS (irritable bowel syndrome)   . Rectal ulcer   . Segmental colitis ([place])   . Tubular adenoma of colon 05/2009   Past Surgical History:  Procedure Laterality Date  . APPENDECTOMY    . ARCUATE KERATECTOMY    . double pallital tori bone removed     . HERNIA REPAIR     umbilical  . KNEE SURGERY Bilateral   . morton's neuroma on rt foot and platar facial release    . MOUTH SURGERY     "bone shaved off roof of mouth"  . SALIVARY GLAND SURGERY     removal  . TONSILLECTOMY      reports that she has never smoked. She has never used smokeless tobacco. She reports that she drinks alcohol. She reports that she does not use drugs. family history includes Anuerysm in her father; Breast cancer in her daughter; Cancer in her daughter; Colon cancer in her maternal aunt and paternal  grandmother; Colon polyps in her brother, daughter, and sister; Diabetes in her brother and mother; Heart disease in her brother, father, and mother; Hyperlipidemia in her brother and mother; Hypertension in her brother, father, and mother; Lung cancer in her sister; Migraines in her daughter. Allergies  Allergen Reactions  . Levaquin [Levofloxacin] Other (See Comments)    Very nauseous and shakey  . Sulfamethoxazole-Trimethoprim Nausea Only    shakey  . Lactose Intolerance (Gi) Diarrhea and Other (See Comments)    Stomach hurts   . Tramadol Hcl Nausea Only and Palpitations  . Augmentin [Amoxicillin-Pot Clavulanate] Diarrhea  . Ciprofloxacin Nausea Only    REACTION: Thrush, Nausea, Shakey  . Pneumococcal Vaccine Polyvalent     REACTION: RED AND RAISED RASH ON ARM  . Adhesive [Tape] Rash  . Codeine Nausea Only     Review of Systems  Constitutional: Negative for appetite change, chills, fever and unexpected weight change.  Respiratory: Negative for shortness of breath.   Cardiovascular: Negative for chest pain and leg swelling.  Gastrointestinal: Negative for abdominal pain, blood in stool, constipation, diarrhea, nausea and vomiting.       Objective:   Physical Exam  Constitutional: She appears well-developed and well-nourished.  Cardiovascular: Normal rate and regular rhythm.  Pulmonary/Chest: Effort normal and breath sounds normal.  Abdominal: Soft. Bowel sounds are normal. She exhibits  no distension and no mass. There is no tenderness. There is no rebound and no guarding.  Musculoskeletal: She exhibits no edema.  Right leg reveals good distal foot pulses. Full range of motion with dorsiflexion and plantar flexion. She has some mild tenderness to palpation along the anterior aspect of the tibia medially. No visible swelling. No ecchymosis. No warmth.       Assessment:     #1 probable shinsplints involving right leg.  #2 increased flatulence    Plan:     -We recommended  some stretches for her lower leg and also recommended icing 20 minutes 2-3 times daily especially after walking -Consider topical diclofenac 1% gel 2-3 times daily if she can get this covered by her insurance -Discussed appropriate diet to reduce flatulence-with handout given  [name] MD [facility] at [facility]

## 2017-08-14 DIAGNOSIS — L821 Other seborrheic keratosis: Secondary | ICD-10-CM | POA: Diagnosis not present

## 2017-08-14 DIAGNOSIS — Z85828 Personal history of other malignant neoplasm of skin: Secondary | ICD-10-CM | POA: Diagnosis not present

## 2017-08-14 DIAGNOSIS — L820 Inflamed seborrheic keratosis: Secondary | ICD-10-CM | POA: Diagnosis not present

## 2017-08-15 DIAGNOSIS — N898 Other specified noninflammatory disorders of vagina: Secondary | ICD-10-CM | POA: Diagnosis not present

## 2017-08-15 DIAGNOSIS — N952 Postmenopausal atrophic vaginitis: Secondary | ICD-10-CM | POA: Diagnosis not present

## 2017-08-15 DIAGNOSIS — Z124 Encounter for screening for malignant neoplasm of cervix: Secondary | ICD-10-CM | POA: Diagnosis not present

## 2017-08-15 DIAGNOSIS — M810 Age-related osteoporosis without current pathological fracture: Secondary | ICD-10-CM | POA: Diagnosis not present

## 2017-08-29 DIAGNOSIS — M5416 Radiculopathy, lumbar region: Secondary | ICD-10-CM | POA: Diagnosis not present

## 2017-08-29 DIAGNOSIS — M4726 Other spondylosis with radiculopathy, lumbar region: Secondary | ICD-10-CM | POA: Diagnosis not present

## 2017-08-31 ENCOUNTER — Ambulatory Visit (INDEPENDENT_AMBULATORY_CARE_PROVIDER_SITE_OTHER): Payer: PRIVATE HEALTH INSURANCE | Admitting: Orthopaedic Surgery

## 2017-09-02 ENCOUNTER — Other Ambulatory Visit: Payer: Self-pay | Admitting: Family Medicine

## 2017-09-04 NOTE — Telephone Encounter (Signed)
Refill OK

## 2017-09-05 ENCOUNTER — Other Ambulatory Visit: Payer: Self-pay | Admitting: Family Medicine

## 2017-09-14 ENCOUNTER — Ambulatory Visit (INDEPENDENT_AMBULATORY_CARE_PROVIDER_SITE_OTHER): Payer: Medicare Other

## 2017-09-14 ENCOUNTER — Ambulatory Visit (INDEPENDENT_AMBULATORY_CARE_PROVIDER_SITE_OTHER): Payer: Medicare Other | Admitting: Surgery

## 2017-09-14 ENCOUNTER — Encounter (INDEPENDENT_AMBULATORY_CARE_PROVIDER_SITE_OTHER): Payer: Self-pay | Admitting: Surgery

## 2017-09-14 VITALS — BP 140/74 | HR 60 | Ht 63.5 in | Wt 126.0 lb

## 2017-09-14 DIAGNOSIS — M79672 Pain in left foot: Secondary | ICD-10-CM

## 2017-09-14 DIAGNOSIS — M84375A Stress fracture, left foot, initial encounter for fracture: Secondary | ICD-10-CM | POA: Diagnosis not present

## 2017-09-14 NOTE — Progress Notes (Signed)
Office Visit Note   Patient: Rebecca [name]           Date of Birth: Jan 21, 1933           MRN: [id number] Visit Date: 09/14/2017              Requested by: [name], MD [place], [place] [address] PCP: [name], MD   Assessment & Plan: Visit Diagnoses:  1. Left foot pain   2. Stress reaction of left foot, initial encounter     Plan: At this point we will treat patient conservatively with a postop shoe.  She will hold off on her exercise routine.  Patient does have a history of osteoporosis and states that she had recent bone density test.  She will follow-up with Dr. Durward Fortes in 2 weeks for recheck.  He can decide at that point if she needs to continue the postop shoe or if further x-rays are indicated.  Follow-Up Instructions: Return in about 2 weeks (around 09/28/2017) for With Dr. Joni Fears for recheck left foot.   Orders:  Orders Placed This Encounter  Procedures  . XR Foot Complete Left   No orders of the defined types were placed in this encounter.     Procedures: No procedures performed   Clinical Data: No additional findings.   Subjective: Chief Complaint  Patient presents with  . Left Foot - Pain    HPI 84 year old white female who is a patient of Dr. Durward Fortes comes in with complaints of acute left midfoot pain and swelling.  Patient states that she normally does walking for exercise at the mall and yesterday did her normal routine of about 25 minutes.  She was wearing sandals when she went for a walk.  Short time later when she returned home she began having pain and swelling across the left midfoot.  No specific injury that she can recall.  No problems before onset.  She states that usually she does wear a different shoe.  Pain when she is ambulating. Review of Systems No current cardiac pulmonary GI GU issues  Objective: Vital Signs: BP 140/74   Pulse 60   Ht 5' 3.5" (1.613 m)   Wt 126 lb (57.2 kg)    BMI 21.97 kg/m   Physical Exam  Constitutional: She is oriented to person, place, and time. She appears well-developed. No distress.  HENT:  Head: Normocephalic.  Eyes: Pupils are equal, round, and reactive to light.  Pulmonary/Chest: No respiratory distress.  Musculoskeletal:  Gait is somewhat antalgic.  Left midfoot she does have some slight swelling.  She is moderately tender across the second third and fourth metatarsals.  Neurovascular intact.  Ankle exam unremarkable.  Neurological: She is alert and oriented to person, place, and time.  Skin: Skin is warm and dry.    Ortho Exam  Specialty Comments:  No specialty comments available.  Imaging: Xr Foot Complete Left  Result Date: 09/14/2017 X-ray left foot I do not see an obvious fracture.  She does have end-stage degenerative changes first MTP joint with periarticular spurs.    PMFS History: Patient Active Problem List   Diagnosis Date Noted  . Cellulitis of left upper extremity 10/15/2016  . 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 Diverticulosis of colon without hemorrhage 02/04/2015  . Unspecified vitamin D deficiency 12/25/2013  . 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 Atherosclerosis 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 04/20/2011  . SPINAL STENOSIS, LUMBAR  03/09/2010  . OTHER OSTEOPOROSIS 03/09/2010  . SCOLIOSIS, LUMBAR SPINE 03/09/2010  . MIGRAINE, OPHTHALMIC 10/29/2009  . LUMBAR RADICULOPATHY, ATYPICAL 10/29/2009  . POLYNEUROPATHY OTHER DISEASES CLASSIFIED ELSW 09/22/2009  . UNSPECIFIED PERIPHERAL VASCULAR DISEASE 09/22/2009  . ADENOMATOUS COLONIC POLYP 06/10/2009  . History of iron deficiency 05/06/2009  . CAROTID ARTERY DISEASE 07/28/2008  . GASTROPARESIS 07/22/2008  . Allergic rhinitis 03/20/2008  . DYSPHAGIA 03/12/2008  . GANGLION OF TENDON SHEATH 08/17/2007  . Hypothyroidism 11/15/2006  . Hyperlipidemia 11/07/2006  . ADVEF, DRUG/MEDICINAL/BIOLOGICAL  SUBST NOS 11/07/2006  . Essential hypertension 10/18/2006  . GERD 10/18/2006  . DIVERTICULOSIS, COLON 10/18/2006   Past Medical History:  Diagnosis Date  . Anemia   . Anxiety   . Diverticulitis   . Diverticulosis   . Gastroparesis   . GERD (gastroesophageal reflux disease)   . Glaucoma   . Hemorrhoids   . Hyperlipidemia   . Hypertension   . Hypertension   . IBS (irritable bowel syndrome)   . Rectal ulcer   . Segmental colitis ([place])   . Tubular adenoma of colon 05/2009    Family History  Problem Relation Age of Onset  . Diabetes Mother   . Heart disease Mother   . Hyperlipidemia Mother   . Hypertension Mother   . Anuerysm Father   . Heart disease Father   . Hypertension Father   . Migraines Daughter   . Colon polyps Sister   . Lung cancer Sister   . Colon polyps Brother   . Diabetes Brother   . Hyperlipidemia Brother   . Hypertension Brother   . Colon polyps Daughter   . Breast cancer Daughter   . Colon cancer Maternal Aunt   . Cancer Daughter        salivary  . Colon cancer Paternal Grandmother   . Heart disease Brother   . Crohn's disease Neg Hx     Past Surgical History:  Procedure Laterality Date  . APPENDECTOMY    . ARCUATE KERATECTOMY    . double pallital tori bone removed     . HERNIA REPAIR     umbilical  . KNEE SURGERY Bilateral   . morton's neuroma on rt foot and platar facial release    . MOUTH SURGERY     "bone shaved off roof of mouth"  . SALIVARY GLAND SURGERY     removal  . TONSILLECTOMY     Social History   Occupational History  . Not on file  Tobacco Use  . Smoking status: Never Smoker  . Smokeless tobacco: Never Used  Substance and Sexual Activity  . Alcohol use: Yes    Alcohol/week: 0.0 oz    Comment: rare  . Drug use: No  . Sexual activity: Yes

## 2017-09-15 ENCOUNTER — Telehealth (INDEPENDENT_AMBULATORY_CARE_PROVIDER_SITE_OTHER): Payer: Self-pay | Admitting: Radiology

## 2017-09-15 NOTE — Telephone Encounter (Signed)
Patient called wanting clarification on what she should or should not do regarding stress fracture. I advised, per Jeneen Rinks note, wear the post op shoe and hold off on exercising, extensive walking. Patient will call office if pain worsens or she has further questions.

## 2017-09-21 ENCOUNTER — Ambulatory Visit (INDEPENDENT_AMBULATORY_CARE_PROVIDER_SITE_OTHER): Payer: PRIVATE HEALTH INSURANCE | Admitting: Orthopaedic Surgery

## 2017-09-25 ENCOUNTER — Other Ambulatory Visit: Payer: Self-pay | Admitting: Family Medicine

## 2017-09-26 ENCOUNTER — Encounter: Payer: Self-pay | Admitting: Family Medicine

## 2017-09-26 DIAGNOSIS — Z1231 Encounter for screening mammogram for malignant neoplasm of breast: Secondary | ICD-10-CM | POA: Diagnosis not present

## 2017-09-26 DIAGNOSIS — Z803 Family history of malignant neoplasm of breast: Secondary | ICD-10-CM | POA: Diagnosis not present

## 2017-09-26 NOTE — Telephone Encounter (Signed)
Refill OK

## 2017-10-03 ENCOUNTER — Encounter: Payer: Self-pay | Admitting: Family Medicine

## 2017-10-03 DIAGNOSIS — M810 Age-related osteoporosis without current pathological fracture: Secondary | ICD-10-CM | POA: Diagnosis not present

## 2017-10-06 ENCOUNTER — Ambulatory Visit (INDEPENDENT_AMBULATORY_CARE_PROVIDER_SITE_OTHER): Payer: PRIVATE HEALTH INSURANCE | Admitting: Orthopaedic Surgery

## 2017-10-09 ENCOUNTER — Encounter (INDEPENDENT_AMBULATORY_CARE_PROVIDER_SITE_OTHER): Payer: Self-pay | Admitting: Orthopaedic Surgery

## 2017-10-09 ENCOUNTER — Ambulatory Visit (INDEPENDENT_AMBULATORY_CARE_PROVIDER_SITE_OTHER): Payer: Medicare Other | Admitting: Orthopaedic Surgery

## 2017-10-09 VITALS — BP 142/67 | HR 67 | Ht 63.5 in | Wt 126.0 lb

## 2017-10-09 DIAGNOSIS — M79672 Pain in left foot: Secondary | ICD-10-CM

## 2017-10-09 NOTE — Progress Notes (Signed)
Office Visit Note   Patient: Rebecca [name]           Date of Birth: 12-13-1932           MRN: [id number] Visit Date: 10/09/2017              Requested by: [name], MD [place], [place] [address] PCP: [name], MD   Assessment & Plan: Visit Diagnoses:  1. Left foot pain     Plan: 1 month status post onset of left foot pain .seen by Jeneen Rinks in the other office with x-rays negative for any acute changes.  Placed in a wooden shoe.  Presently asymptomatic.  May wean from the shoe and return to see me as needed  Follow-Up Instructions: Return if symptoms worsen or fail to improve.   Orders:  No orders of the defined types were placed in this encounter.  No orders of the defined types were placed in this encounter.     Procedures: No procedures performed   Clinical Data: No additional findings.   Subjective: Chief Complaint  Patient presents with  . Follow-up    L FOOT PAIN 09/14/17 HAVING PAIN AND SWELLING BUT HAS WENT AWAY  F/U NO INJURY.Orion Crook SHIN SPLINT  1 month status post rather acute onset of left foot pain.  Seen in the other office without acute injury.  Placed in a wooden shoe with resolution of her pain over the past several weeks.  Not having any appreciable pain. I did review the films that there is significant degenerative change at the metatarsal phalangeal joint of the great toe which is asymptomatic.  There is evidence of possible stress fractures in several of the metatarsals which also appears to be old.  HPI  Review of Systems  Constitutional: Negative for fatigue and fever.  HENT: Negative for ear pain.   Eyes: Negative for pain.  Respiratory: Negative for cough and shortness of breath.   Cardiovascular: Negative for leg swelling.  Gastrointestinal: Negative for constipation and diarrhea.  Genitourinary: Negative for difficulty urinating.  Musculoskeletal: Negative for back pain and neck pain.  Skin:  Negative for rash.  Allergic/Immunologic: Negative for food allergies.  Neurological: Negative for weakness and numbness.  Hematological: Bruises/bleeds easily.  Psychiatric/Behavioral: Negative for sleep disturbance.     Objective: Vital Signs: BP (!) 142/67 (BP Location: Left Arm, Patient Position: Sitting, Cuff Size: Normal)   Pulse 67   Ht 5' 3.5" (1.613 m)   Wt 126 lb (57.2 kg)   BMI 21.97 kg/m   Physical Exam  Constitutional: She is oriented to person, place, and time. She appears well-developed and well-nourished.  HENT:  Mouth/Throat: Oropharynx is clear and moist.  Eyes: Pupils are equal, round, and reactive to light. EOM are normal.  Pulmonary/Chest: Effort normal.  Neurological: She is alert and oriented to person, place, and time.  Skin: Skin is warm and dry.  Psychiatric: She has a normal mood and affect. Her behavior is normal.    Ortho Exam awake alert and oriented x3.  Comfortable sitting.  Walks without a limp.  Wooden shoe removed from left foot.  No localized areas of tenderness about the left midfoot, forefoot hindfoot or heel.  No skin changes.  +1 pulses.  Neurologically intact.  Despite the degenerative changes at the first metatarsal phalangeal joint there was minimal pain.  No toe edema  Specialty Comments:  No specialty comments available.  Imaging: No results found.   [place]  History: Patient Active Problem List   Diagnosis Date Noted  . Cellulitis of left upper extremity 10/15/2016  . 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 Diverticulosis of colon without hemorrhage 02/04/2015  . Unspecified vitamin D deficiency 12/25/2013  . 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 Atherosclerosis 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 04/20/2011  . SPINAL STENOSIS, LUMBAR 03/09/2010  . OTHER OSTEOPOROSIS 03/09/2010  . SCOLIOSIS, LUMBAR SPINE 03/09/2010  . MIGRAINE, OPHTHALMIC 10/29/2009  . LUMBAR RADICULOPATHY,  ATYPICAL 10/29/2009  . POLYNEUROPATHY OTHER DISEASES CLASSIFIED ELSW 09/22/2009  . UNSPECIFIED PERIPHERAL VASCULAR DISEASE 09/22/2009  . ADENOMATOUS COLONIC POLYP 06/10/2009  . History of iron deficiency 05/06/2009  . CAROTID ARTERY DISEASE 07/28/2008  . GASTROPARESIS 07/22/2008  . Allergic rhinitis 03/20/2008  . DYSPHAGIA 03/12/2008  . GANGLION OF TENDON SHEATH 08/17/2007  . Hypothyroidism 11/15/2006  . Hyperlipidemia 11/07/2006  . ADVEF, DRUG/MEDICINAL/BIOLOGICAL SUBST NOS 11/07/2006  . Essential hypertension 10/18/2006  . GERD 10/18/2006  . DIVERTICULOSIS, COLON 10/18/2006   Past Medical History:  Diagnosis Date  . Anemia   . Anxiety   . Diverticulitis   . Diverticulosis   . Gastroparesis   . GERD (gastroesophageal reflux disease)   . Glaucoma   . Hemorrhoids   . Hyperlipidemia   . Hypertension   . Hypertension   . IBS (irritable bowel syndrome)   . Rectal ulcer   . Segmental colitis ([place])   . Tubular adenoma of colon 05/2009    Family History  Problem Relation Age of Onset  . Diabetes Mother   . Heart disease Mother   . Hyperlipidemia Mother   . Hypertension Mother   . Anuerysm Father   . Heart disease Father   . Hypertension Father   . Migraines Daughter   . Colon polyps Sister   . Lung cancer Sister   . Colon polyps Brother   . Diabetes Brother   . Hyperlipidemia Brother   . Hypertension Brother   . Colon polyps Daughter   . Breast cancer Daughter   . Colon cancer Maternal Aunt   . Cancer Daughter        salivary  . Colon cancer Paternal Grandmother   . Heart disease Brother   . Crohn's disease Neg Hx     Past Surgical History:  Procedure Laterality Date  . APPENDECTOMY    . ARCUATE KERATECTOMY    . double pallital tori bone removed     . HERNIA REPAIR     umbilical  . KNEE SURGERY Bilateral   . morton's neuroma on rt foot and platar facial release    . MOUTH SURGERY     "bone shaved off roof of mouth"  . SALIVARY GLAND SURGERY     removal    . TONSILLECTOMY     Social History   Occupational History  . Not on file  Tobacco Use  . Smoking status: Never Smoker  . Smokeless tobacco: Never Used  Substance and Sexual Activity  . Alcohol use: Yes    Alcohol/week: 0.0 oz    Comment: rare  . Drug use: No  . Sexual activity: Yes

## 2017-10-10 ENCOUNTER — Other Ambulatory Visit: Payer: Self-pay | Admitting: Obstetrics

## 2017-10-11 ENCOUNTER — Telehealth: Payer: Self-pay | Admitting: Gastroenterology

## 2017-10-11 NOTE — Telephone Encounter (Signed)
Agree with scheduling with Dr Tarri Glenn. Thanks

## 2017-10-11 NOTE — Telephone Encounter (Signed)
OK or can schedule with Dr. Tarri Glenn if agreeable to all.

## 2017-10-11 NOTE — Telephone Encounter (Signed)
Dr. Silverio Decamp and Fuller Plan do you approve of switch?

## 2017-10-12 ENCOUNTER — Other Ambulatory Visit: Payer: Self-pay | Admitting: Family Medicine

## 2017-10-16 ENCOUNTER — Other Ambulatory Visit: Payer: Self-pay | Admitting: Family Medicine

## 2017-10-17 NOTE — Telephone Encounter (Signed)
Refill OK

## 2017-10-17 NOTE — Telephone Encounter (Signed)
Last refill 03/15/17 and last office visit 08/09/17.  Okay to fill?

## 2017-10-20 NOTE — Telephone Encounter (Signed)
Patient scheduled an office visit with Rebecca [name] 11/01/17 at 11am wanted to be seen sooner.

## 2017-10-26 ENCOUNTER — Ambulatory Visit (HOSPITAL_COMMUNITY)
Admission: RE | Admit: 2017-10-26 | Discharge: 2017-10-26 | Disposition: A | Discharge status: Home / Self Care | Payer: Medicare Other | Source: Ambulatory Visit | Attending: Obstetrics | Admitting: Obstetrics

## 2017-10-26 ENCOUNTER — Encounter (HOSPITAL_COMMUNITY): Payer: Self-pay

## 2017-10-26 DIAGNOSIS — M810 Age-related osteoporosis without current pathological fracture: Secondary | ICD-10-CM | POA: Diagnosis not present

## 2017-10-26 MED ORDER — ZOLEDRONIC ACID 5 MG/100ML IV SOLN
5.0000 mg | Freq: Once | INTRAVENOUS | Status: AC
  Administered 2017-10-26: 5 mg via INTRAVENOUS
  Filled 2017-10-26: qty 100

## 2017-10-26 MED ORDER — SODIUM CHLORIDE 0.9 % IV SOLN
INTRAVENOUS | Status: AC
  Administered 2017-10-26: 10:00:00 via INTRAVENOUS

## 2017-10-26 NOTE — Discharge Instructions (Signed)

## 2017-11-01 ENCOUNTER — Encounter: Payer: Self-pay | Admitting: Physician Assistant

## 2017-11-01 ENCOUNTER — Ambulatory Visit (INDEPENDENT_AMBULATORY_CARE_PROVIDER_SITE_OTHER): Payer: Medicare Other | Admitting: Physician Assistant

## 2017-11-01 VITALS — BP 132/70 | HR 56 | Ht 63.0 in | Wt 127.2 lb

## 2017-11-01 DIAGNOSIS — R143 Flatulence: Secondary | ICD-10-CM

## 2017-11-01 DIAGNOSIS — R140 Abdominal distension (gaseous): Secondary | ICD-10-CM | POA: Diagnosis not present

## 2017-11-01 DIAGNOSIS — K582 Mixed irritable bowel syndrome: Secondary | ICD-10-CM | POA: Diagnosis not present

## 2017-11-01 NOTE — Progress Notes (Signed)
Reviewed and agree with management plan. Patient requested transfer to a female gastroenterologist and she will establish with Dr. Tarri Glenn in September.  Rebecca [name]. Fuller Plan, MD [facility]

## 2017-11-01 NOTE — Patient Instructions (Signed)
Normal BMI (Body Mass Index- based on height and weight) is between 23 and 30. Your BMI today is Body mass index is 22.54 kg/m. [name] Kitchen Please consider follow up  regarding your BMI with your Primary Care Provider.  We have given you samples of Xifaxan 550 mg. Take 1 tablet 3 times daily for 14 days.   Then try IB GARD- take 2 capsules by mouth twice daily.  Continue  Low Gas Diet. Call us back in 3 weeks and ask for Amy's nurse. [facility]).

## 2017-11-01 NOTE — Progress Notes (Signed)
Subjective:    Patient ID: Rebecca [name], female    DOB: 10-Nov-1932, 84 y.o.   MRN: [id number]  HPI Lachina is a pleasant 84 year old white female, known to Dr. Fuller Plan who comes in today with complaints of ongoing problems with abdominal gas and some bloating. She has history of IBS, severe diverticular disease with previously documented sigmoid stricture and associated chronic inflammation.  Also with chronic gastroparesis, polyneuropathy, spinal stenosis, coronary artery disease and hypertension. She was last seen in the office in 2016. She had barium swallow at that time because of some complaints of dysphasia that had shown L dysmotility, prominent cricopharyngeus with pseudo-Zenker's and minimal peptic stricture in the mid esophagus.  She has been maintained on Nexium, ranitidine and is on low-dose Reglan 5 mg twice daily long-term for gastroparesis. She last had colonoscopy in September 2016 with findings as above showing the friable stricture in the sigmoid colon which was noted to be edematous.  Biopsies were taken showing densely inflamed polypoid colonic mucosa.  She also had removal of a 5 mm polyp which was a tubular adenoma. Patient says she is somewhat frustrated because she did not get much feedback from Dr. Fuller Plan regarding her gas and bloating.  She is requesting to establish with a female gastroenterologist. She says she rarely leaves her house in the mornings because she frequently will have urgency for bowel movements and typically has a lot of gas.  She says generally by the afternoon she feels a bit better.  She gets frustrated going out to eat because typically if she eats a meal out with friends she will start noticing some rumbling in her gut  and then as soon as she stands up she starts having flatus which she cannot control. She was told to follow a low gas diet which she has been doing without much change in her symptoms.  She also takes 2-3 Gas-X and uses Beano fairly  regularly and especially if she knows she is going out to eat.  She says her abdomen rarely feels "good" though she is not generally having in any ongoing pain she may have lower abdominal discomfort. Generally does not require laxatives, uses stool softeners and prune juice as needed. She is not aware of any specific food triggers as symptoms are chronic and present daily.  Review of Systems;Pertinent positive and negative review of systems were noted in the above HPI section.  All other review of systems was otherwise negative.  Outpatient Encounter Medications as of 11/01/2017  Medication Sig  . ALPRAZolam (XANAX) 0.25 MG tablet TAKE 1 TABLET BY MOUTH AT BEDTIME IF NEEDED  . Artificial Tear Solution (GENTEAL TEARS) 0.1-0.2-0.3 % SOLN Place 2 drops into both eyes at bedtime.  . Calcium Carbonate-Vitamin D (CALCIUM-VITAMIN D) 500-200 MG-UNIT per tablet Take 1 tablet by mouth 2 (two) times daily with a meal.    . Cholecalciferol (VITAMIN D3) 2000 UNITS TABS Take 1 tablet by mouth daily.   . diclofenac sodium (VOLTAREN) 1 % GEL Apply 2 g topically 4 (four) times daily.  [name] Kitchen esomeprazole (NEXIUM) 20 MG capsule Take 20 mg by mouth 2 (two) times daily before a meal.  . gabapentin (NEURONTIN) 100 MG capsule TAKE 2 CAPSULES BY MOUTH THREE TIMES A DAY (Patient taking differently: at bedtime. TAKE 2 CAPSULES BY MOUTH at bedtime)  . levothyroxine (SYNTHROID, LEVOTHROID) 88 MCG tablet TAKE 1 TABLET BY MOUTH ONCE DAILY  . losartan (COZAAR) 100 MG tablet take 1 tablet by mouth once daily  .  metoCLOPramide (REGLAN) 5 MG tablet TAKE 1 TABLET BY MOUTH WITH MEALS TWICE A DAY  . ranitidine (ZANTAC) 300 MG tablet Take 1 tablet (300 mg total) by mouth at bedtime.  . simvastatin (ZOCOR) 20 MG tablet TAKE 1 TABLET BY MOUTH AT BEDTIME  . vitamin B-12 (CYANOCOBALAMIN) 500 MCG tablet Take 500 mcg by mouth every morning.   . vitamin C (ASCORBIC ACID) 500 MG tablet Take 500 mg by mouth daily.  . vitamin E (VITAMIN E) 400  UNIT capsule Take 400 Units by mouth daily.  . Zoledronic Acid (RECLAST IV) Inject into the vein. Once per year.  . [DISCONTINUED] ALPRAZolam (XANAX) 0.25 MG tablet TAKE 1 TABLET BY MOUTH AT BEDTIME IF NEEDED  . [DISCONTINUED] olmesartan (BENICAR) 20 MG tablet Take 20 mg by mouth daily.     No facility-administered encounter medications on file as of 11/01/2017.    Allergies  Allergen Reactions  . Levaquin [Levofloxacin] Other (See Comments)    Very nauseous and shakey  . Sulfamethoxazole-Trimethoprim Nausea Only    shakey  . Lactose Intolerance (Gi) Diarrhea and Other (See Comments)    Stomach hurts   . Tramadol Hcl Nausea Only and Palpitations  . Augmentin [Amoxicillin-Pot Clavulanate] Diarrhea  . Ciprofloxacin Nausea Only    REACTION: Thrush, Nausea, Shakey  . Pneumococcal Vaccine Polyvalent     REACTION: RED AND RAISED RASH ON ARM  . Adhesive [Tape] Rash  . Codeine Nausea Only   Patient Active Problem List   Diagnosis Date Noted  . Cellulitis of left upper extremity 10/15/2016  . 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 Diverticulosis of colon without hemorrhage 02/04/2015  . Unspecified vitamin D deficiency 12/25/2013  . 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 Atherosclerosis 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 04/20/2011  . SPINAL STENOSIS, LUMBAR 03/09/2010  . OTHER OSTEOPOROSIS 03/09/2010  . SCOLIOSIS, LUMBAR SPINE 03/09/2010  . MIGRAINE, OPHTHALMIC 10/29/2009  . LUMBAR RADICULOPATHY, ATYPICAL 10/29/2009  . POLYNEUROPATHY OTHER DISEASES CLASSIFIED ELSW 09/22/2009  . UNSPECIFIED PERIPHERAL VASCULAR DISEASE 09/22/2009  . ADENOMATOUS COLONIC POLYP 06/10/2009  . History of iron deficiency 05/06/2009  . CAROTID ARTERY DISEASE 07/28/2008  . GASTROPARESIS 07/22/2008  . Allergic rhinitis 03/20/2008  . DYSPHAGIA 03/12/2008  . GANGLION OF TENDON SHEATH 08/17/2007  . Hypothyroidism 11/15/2006  . Hyperlipidemia 11/07/2006  .  ADVEF, DRUG/MEDICINAL/BIOLOGICAL SUBST NOS 11/07/2006  . Essential hypertension 10/18/2006  . GERD 10/18/2006  . DIVERTICULOSIS, COLON 10/18/2006   Social History   Socioeconomic History  . Marital status: Widowed    Spouse name: Not on file  . Number of children: 3  . Years of education: Not on file  . Highest education level: Not on file  Occupational History  . Occupation: Retired  Scientific laboratory technician  . Financial resource strain: Not on file  . Food insecurity:    Worry: Not on file    Inability: Not on file  . Transportation needs:    Medical: Not on file    Non-medical: Not on file  Tobacco Use  . Smoking status: Never Smoker  . Smokeless tobacco: Never Used  Substance and Sexual Activity  . Alcohol use: Yes    Alcohol/week: 0.0 oz    Comment: rare  . Drug use: No  . Sexual activity: Yes  Lifestyle  . Physical activity:    Days per week: Not on file    Minutes per session: Not on file  . Stress: Not on file  Relationships  . Social connections:  Talks on phone: Not on file    Gets together: Not on file    Attends religious service: Not on file    Active member of club or organization: Not on file    Attends meetings of clubs or organizations: Not on file    Relationship status: Not on file  . Intimate partner violence:    Fear of current or ex partner: Not on file    Emotionally abused: Not on file    Physically abused: Not on file    Forced sexual activity: Not on file  Other Topics Concern  . Not on file  Social History Narrative  . Not on file    Ms. Hardigree's family history includes Anuerysm in her father; Breast cancer in her daughter; Cancer in her daughter; Colon cancer in her maternal aunt and paternal grandmother; Colon polyps in her brother, daughter, and sister; Diabetes in her brother and mother; Heart disease in her brother, father, and mother; Hyperlipidemia in her brother and mother; Hypertension in her brother, father, and mother; Lung cancer  in her sister; Migraines in her daughter.      Objective:    Vitals:   11/01/17 1055  BP: 132/70  Pulse: (!) 56    Physical Exam; well-developed elderly white female in no acute distress, appears younger than stated age, pleasant blood pressure 132/70 pulse 56, height 5 foot 3, weight 127, BMI 22.5  HEENT; nontraumatic normocephalic EOMI PERRLA sclera anicteric buccal mucosa moist, neck supple, Cardiovascular; regular rate and rhythm with S1-S2 no murmur rub or gallop, Pulmonary; clear bilaterally, Abdomen; soft, nondistended, bowel sounds are present somewhat hyperactive, there is no focal tenderness, no guarding or rebound no palpable mass or hepatosplenomegaly, Rectal ;exam not done, Ext; no clubbing cyanosis or edema skin warm dry, Neuro psych; alert and oriented, mood and affect appropriate grossly nonfocal       Assessment & Plan:   #25 84 year old white female with chronic gas/flatus and bloating and alternating bowel habits. Think her symptoms are consistent with IBS and may have component of small intestinal bacterial overgrowth. She has persistent symptoms despite following a low gas diet regular use of Gas-X and Beano .  #2 severe diverticulosis with sigmoid stricture and segment of colitis documented on biopsy at the time of last colonoscopy.  She really has not had symptoms consistent with SCAD  #3 chronic gastroparesis-stable on Reglan 5 mg twice daily #4.  Coronary artery disease #5.  Hypertension #6.  Polyneuropathy #7.  GERD with mild esophageal dysmotility #8 adenomatous colon polyps-last colonoscopy September 2016/no surveillance plan due to advanced age  Plan; continue low gas diet. Continue Gas-X as needed We will give her a course of Xifaxan 550 mg p.o. 3 times daily x14 days. Have also asked her to try IBgard 2 p.o. twice daily before meal. Patient will call back in 3 weeks with a progress report.  Hopefully Xifaxan will be beneficial. Patient had requested  to switch providers.  I am happy to see her back in follow-up, and moving forward she will change positions from Dr. Fuller Plan to Dr. Tarri Glenn.   Amy S Esterwood PA-C 11/01/2017   Cc: [name], MD

## 2017-11-20 ENCOUNTER — Other Ambulatory Visit: Payer: Self-pay | Admitting: Family Medicine

## 2017-11-20 NOTE — Telephone Encounter (Signed)
ok 

## 2017-11-20 NOTE — Telephone Encounter (Signed)
Last refill 10/17/17 and last office visit 08/09/17.  Okay to fill?

## 2017-11-23 DIAGNOSIS — N816 Rectocele: Secondary | ICD-10-CM | POA: Diagnosis not present

## 2017-11-23 DIAGNOSIS — N8111 Cystocele, midline: Secondary | ICD-10-CM | POA: Diagnosis not present

## 2017-11-29 ENCOUNTER — Other Ambulatory Visit: Payer: Self-pay | Admitting: Family Medicine

## 2017-11-30 ENCOUNTER — Ambulatory Visit (INDEPENDENT_AMBULATORY_CARE_PROVIDER_SITE_OTHER): Payer: Medicare Other | Admitting: Orthopaedic Surgery

## 2017-11-30 ENCOUNTER — Encounter (INDEPENDENT_AMBULATORY_CARE_PROVIDER_SITE_OTHER): Payer: Self-pay | Admitting: Orthopaedic Surgery

## 2017-11-30 ENCOUNTER — Ambulatory Visit (INDEPENDENT_AMBULATORY_CARE_PROVIDER_SITE_OTHER): Payer: Medicare Other

## 2017-11-30 VITALS — BP 127/66 | HR 60 | Ht 63.5 in | Wt 126.0 lb

## 2017-11-30 DIAGNOSIS — M25561 Pain in right knee: Secondary | ICD-10-CM | POA: Diagnosis not present

## 2017-11-30 DIAGNOSIS — G8929 Other chronic pain: Secondary | ICD-10-CM

## 2017-11-30 MED ORDER — METHYLPREDNISOLONE ACETATE 40 MG/ML IJ SUSP
80.0000 mg | INTRAMUSCULAR | Status: AC | PRN
  Administered 2017-11-30: 80 mg

## 2017-11-30 MED ORDER — LIDOCAINE HCL 1 % IJ SOLN
2.0000 mL | INTRAMUSCULAR | Status: AC | PRN
  Administered 2017-11-30: 2 mL

## 2017-11-30 MED ORDER — BUPIVACAINE HCL 0.5 % IJ SOLN
2.0000 mL | INTRAMUSCULAR | Status: AC | PRN
  Administered 2017-11-30: 2 mL via INTRA_ARTICULAR

## 2017-11-30 NOTE — Progress Notes (Signed)
Office Visit Note   Patient: Rebecca [name]           Date of Birth: Nov 13, 1932           MRN: [id number] Visit Date: 11/30/2017              Requested by: Rebecca Post, MD [place], [place] [address] PCP: Rebecca Post, MD   Assessment & Plan: Visit Diagnoses:  1. Chronic pain of right knee     Plan: Tricompartmental osteoarthritis Follow-Up Instructions: No follow-ups on file.   Orders:  Orders Placed This Encounter  Procedures  . XR KNEE 3 VIEW RIGHT   No orders of the defined types were placed in this encounter.     Procedures: Large Joint Inj: R knee on 11/30/2017 3:01 PM Indications: pain and diagnostic evaluation Details: 25 G 1.5 in needle, anteromedial approach  Arthrogram: No  Medications: 2 mL lidocaine 1 %; 2 mL bupivacaine 0.5 %; 80 mg methylPREDNISolone acetate 40 MG/ML Procedure, treatment alternatives, risks and benefits explained, specific risks discussed. Consent was given by the patient. Immediately prior to procedure a time out was called to verify the correct patient, procedure, equipment, support staff and site/side marked as required. Patient was prepped and draped in the usual sterile fashion.       Clinical Data: No additional findings.   Subjective: Chief Complaint  Patient presents with  . Follow-up    R KNEE PAIN FOR 1 WEEK  NO INJURY, OR INJECTIONS, HAD 2 SURGERIS ON KNEE PT CANT REMEMBER DATE  Rebecca [name] is had a recent exacerbation of right knee pain.  Denies any history of injury or trauma.  Pain is described as achy "deep".  No obvious swelling.  No sensation of her knee giving way.  She feels better when she simply holds the knee.  No pain proximally or distally.  No redness. 2014 she had x-rays of the same knee that demonstrated tricompartmental degenerative changes  HPI  Review of Systems  Constitutional: Negative for fatigue and fever.  HENT: Negative for ear pain.   Eyes: Negative  for pain.  Respiratory: Negative for cough and shortness of breath.   Cardiovascular: Negative for leg swelling.  Gastrointestinal: Negative for constipation and diarrhea.  Genitourinary: Negative for difficulty urinating.  Musculoskeletal: Positive for back pain. Negative for neck pain.  Skin: Negative for rash.  Allergic/Immunologic: Negative for food allergies.  Neurological: Positive for weakness. Negative for numbness.  Hematological: Does not bruise/bleed easily.  Psychiatric/Behavioral: Negative for sleep disturbance.     Objective: Vital Signs: BP 127/66 (BP Location: Left Arm, Patient Position: Sitting, Cuff Size: Normal)   Pulse 60   Ht 5' 3.5" (1.613 m)   Wt 126 lb (57.2 kg)   BMI 21.97 kg/m   Physical Exam  Constitutional: She is oriented to person, place, and time. She appears well-developed and well-nourished.  HENT:  Mouth/Throat: Oropharynx is clear and moist.  Eyes: Pupils are equal, round, and reactive to light. EOM are normal.  Pulmonary/Chest: Effort normal.  Neurological: She is alert and oriented to person, place, and time.  Skin: Skin is warm and dry.  Psychiatric: She has a normal mood and affect. Her behavior is normal.    Ortho Exam awake alert and oriented x3.  Comfortable sitting.  Walk without a limp.  Probably small effusion of her right knee.  No obvious instability.  Full extension flexion over 105 degrees.  Walks without a limp area straight  leg raise negative.  Painless range of motion both hips.  Mild medial lateral joint pain.  Positive patellar crepitation  Specialty Comments:  No specialty comments available.  Imaging: No results found.   PMFS History: Patient Active Problem List   Diagnosis Date Noted  . Cellulitis of left upper extremity 10/15/2016  . 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 Diverticulosis of colon without hemorrhage 02/04/2015  . Unspecified vitamin D deficiency 12/25/2013  . Sclerosing  panniculitis 07/28/2011  . Atherosclerosis 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 04/20/2011  . SPINAL STENOSIS, LUMBAR 03/09/2010  . OTHER OSTEOPOROSIS 03/09/2010  . SCOLIOSIS, LUMBAR SPINE 03/09/2010  . MIGRAINE, OPHTHALMIC 10/29/2009  . LUMBAR RADICULOPATHY, ATYPICAL 10/29/2009  . POLYNEUROPATHY OTHER DISEASES CLASSIFIED ELSW 09/22/2009  . UNSPECIFIED PERIPHERAL VASCULAR DISEASE 09/22/2009  . ADENOMATOUS COLONIC POLYP 06/10/2009  . History of iron deficiency 05/06/2009  . CAROTID ARTERY DISEASE 07/28/2008  . GASTROPARESIS 07/22/2008  . Allergic rhinitis 03/20/2008  . DYSPHAGIA 03/12/2008  . GANGLION OF TENDON SHEATH 08/17/2007  . Hypothyroidism 11/15/2006  . Hyperlipidemia 11/07/2006  . ADVEF, DRUG/MEDICINAL/BIOLOGICAL SUBST NOS 11/07/2006  . Essential hypertension 10/18/2006  . GERD 10/18/2006  . DIVERTICULOSIS, COLON 10/18/2006   Past Medical History:  Diagnosis Date  . Anemia   . Anxiety   . Diverticulitis   . Diverticulosis   . Gastroparesis   . GERD (gastroesophageal reflux disease)   . Glaucoma   . Hemorrhoids   . Hyperlipidemia   . Hypertension   . Hypertension   . IBS (irritable bowel syndrome)   . Rectal ulcer   . Segmental colitis ([place])   . Tubular adenoma of colon 05/2009    Family History  Problem Relation Age of Onset  . Diabetes Mother   . Heart disease Mother   . Hyperlipidemia Mother   . Hypertension Mother   . Anuerysm Father   . Heart disease Father   . Hypertension Father   . Migraines Daughter   . Colon polyps Sister   . Lung cancer Sister   . Colon polyps Brother   . Diabetes Brother   . Hyperlipidemia Brother   . Hypertension Brother   . Colon polyps Daughter   . Breast cancer Daughter   . Colon cancer Maternal Aunt        Rectal cancer  . Cancer Daughter        salivary  . Colon cancer Paternal Grandmother        with possible stomach cancer  . Heart disease Brother   . Crohn's disease Neg Hx   .  Pancreatic cancer Neg Hx     Past Surgical History:  Procedure Laterality Date  . APPENDECTOMY    . ARCUATE KERATECTOMY    . double pallital tori bone removed     . HERNIA REPAIR     umbilical  . KNEE SURGERY Bilateral   . morton's neuroma on rt foot and platar facial release    . MOUTH SURGERY     "bone shaved off roof of mouth"  . SALIVARY GLAND SURGERY     removal  . TONSILLECTOMY     Social History   Occupational History  . Occupation: Retired  Tobacco Use  . Smoking status: Never Smoker  . Smokeless tobacco: Never Used  Substance and Sexual Activity  . Alcohol use: Yes    Alcohol/week: 0.0 standard drinks    Comment: rare  . Drug use: No  . Sexual activity: Yes

## 2017-12-04 LAB — BASIC METABOLIC PANEL: Creatinine: 0.9 (ref <=1.1)

## 2017-12-14 DIAGNOSIS — H52203 Unspecified astigmatism, bilateral: Secondary | ICD-10-CM | POA: Diagnosis not present

## 2017-12-14 DIAGNOSIS — H401132 Primary open-angle glaucoma, bilateral, moderate stage: Secondary | ICD-10-CM | POA: Diagnosis not present

## 2017-12-14 DIAGNOSIS — Z961 Presence of intraocular lens: Secondary | ICD-10-CM | POA: Diagnosis not present

## 2018-01-02 ENCOUNTER — Ambulatory Visit (INDEPENDENT_AMBULATORY_CARE_PROVIDER_SITE_OTHER): Payer: Medicare Other | Admitting: *Deleted

## 2018-01-02 DIAGNOSIS — Z23 Encounter for immunization: Secondary | ICD-10-CM | POA: Diagnosis not present

## 2018-01-08 ENCOUNTER — Other Ambulatory Visit: Payer: Self-pay | Admitting: Family Medicine

## 2018-01-08 NOTE — Telephone Encounter (Signed)
Last OV 08/09/17, Next OV 02/13/18  Last filled 11/21/17, # 30 with 0 refills

## 2018-01-09 NOTE — Telephone Encounter (Signed)
Called patient and went over message from Dr. Elease Hashimoto. Patient states that she only takes a half at night if she needs it and a whole pill when she goes to the dentist. Patient verbalized an understanding.

## 2018-01-09 NOTE — Telephone Encounter (Signed)
Refill #20.  Recommend against regular use and try tapering off completely (to reduce risk of falls) - She apparently is already down to using prn.

## 2018-01-16 DIAGNOSIS — H401132 Primary open-angle glaucoma, bilateral, moderate stage: Secondary | ICD-10-CM | POA: Diagnosis not present

## 2018-01-22 ENCOUNTER — Other Ambulatory Visit: Payer: Self-pay | Admitting: Family Medicine

## 2018-01-26 ENCOUNTER — Encounter: Payer: Self-pay | Admitting: Family Medicine

## 2018-01-26 ENCOUNTER — Other Ambulatory Visit: Payer: Self-pay

## 2018-01-26 MED ORDER — GABAPENTIN 100 MG PO CAPS: 200.0000 mg | ORAL_CAPSULE | Freq: Every day | ORAL | 3 refills | Status: DC

## 2018-02-02 DIAGNOSIS — M5416 Radiculopathy, lumbar region: Secondary | ICD-10-CM | POA: Diagnosis not present

## 2018-02-02 DIAGNOSIS — Z8739 Personal history of other diseases of the musculoskeletal system and connective tissue: Secondary | ICD-10-CM | POA: Diagnosis not present

## 2018-02-02 DIAGNOSIS — M5116 Intervertebral disc disorders with radiculopathy, lumbar region: Secondary | ICD-10-CM | POA: Diagnosis not present

## 2018-02-09 ENCOUNTER — Ambulatory Visit (INDEPENDENT_AMBULATORY_CARE_PROVIDER_SITE_OTHER): Payer: Medicare Other | Admitting: Orthopaedic Surgery

## 2018-02-09 ENCOUNTER — Encounter: Payer: Medicare Other | Admitting: Family Medicine

## 2018-02-09 ENCOUNTER — Ambulatory Visit: Payer: Medicare Other

## 2018-02-09 ENCOUNTER — Encounter (INDEPENDENT_AMBULATORY_CARE_PROVIDER_SITE_OTHER): Payer: Self-pay | Admitting: Orthopaedic Surgery

## 2018-02-09 ENCOUNTER — Ambulatory Visit (INDEPENDENT_AMBULATORY_CARE_PROVIDER_SITE_OTHER): Payer: Medicare Other

## 2018-02-09 VITALS — BP 138/64 | HR 60 | Ht 63.5 in | Wt 125.0 lb

## 2018-02-09 DIAGNOSIS — M25562 Pain in left knee: Secondary | ICD-10-CM | POA: Diagnosis not present

## 2018-02-09 DIAGNOSIS — H401132 Primary open-angle glaucoma, bilateral, moderate stage: Secondary | ICD-10-CM | POA: Diagnosis not present

## 2018-02-09 NOTE — Progress Notes (Signed)
Office Visit Note   Patient: Rebecca [name]           Date of Birth: 07/26/1932           MRN: [id number] Visit Date: 02/09/2018              Requested by: [name], MD [place], [place] [address] PCP: [name], MD   Assessment & Plan: Visit Diagnoses:  1. Acute pain of left knee     Plan: Osteoarthritis left knee with associated CPPD.  Will use Voltaren gel.  Had recent cortisone injection in the lumbar spine with elevation of ocular pressures so we will hold off on further injections.  Follow-Up Instructions: No follow-ups on file.   Orders:  Orders Placed This Encounter  Procedures  . XR KNEE 3 VIEW LEFT   No orders of the defined types were placed in this encounter.     Procedures: No procedures performed   Clinical Data: No additional findings.   Subjective: Chief Complaint  Patient presents with  . Follow-up    L KNEE PAIN, HURTS IN THE BACK OF THE KNEE WHEN BENDING KNEE, STARTING TO GET BETTER HAD SPINE INJECTION FRIDAY AND AFTER MEDS WORE OFF HAD KNEE PAIN, THINKS MAY HAVE INJURED FROM GETTING IN OR OUT OF CAR OR ON THE X RAY TABLE  Rather acute onset of left knee pain within the past week seemingly after receiving an epidural steroid injection does have glaucoma and is concerned about elevation of ocular pressure with cortisone.  No injury or trauma.  Slight effusion.  Feeling better with Voltaren gel.  Has history of prior arthritic episodes right knee  HPI  Review of Systems  Constitutional: Negative for fatigue and fever.  HENT: Negative for ear pain.   Eyes: Negative for pain.  Respiratory: Negative for cough and shortness of breath.   Cardiovascular: Negative for leg swelling.  Gastrointestinal: Negative for constipation and diarrhea.  Genitourinary: Negative for difficulty urinating.  Musculoskeletal: Negative for back pain and neck pain.  Skin: Negative for rash.  Allergic/Immunologic: Negative for  food allergies.  Neurological: Positive for weakness. Negative for numbness.  Hematological: Bruises/bleeds easily.  Psychiatric/Behavioral: Negative for sleep disturbance.     Objective: Vital Signs: There were no vitals taken for this visit.  Physical Exam  Constitutional: She is oriented to person, place, and time. She appears well-developed and well-nourished.  HENT:  Mouth/Throat: Oropharynx is clear and moist.  Eyes: Pupils are equal, round, and reactive to light. EOM are normal.  Pulmonary/Chest: Effort normal.  Neurological: She is alert and oriented to person, place, and time.  Skin: Skin is warm and dry.  Psychiatric: She has a normal mood and affect. Her behavior is normal.    Ortho Exam awake alert and oriented x3.  Comfortable sitting.  Walks with minimal limp referable to the left knee.  Small knee effusion but knee was not hot red or swollen.  Some medial lateral joint pain but full extension and flexion over 100 degrees.  No instability.  No distal edema.  Neurovascular exam intact  Specialty Comments:  No specialty comments available.  Imaging: No results found.   PMFS History: Patient Active Problem List   Diagnosis Date Noted  . Cellulitis of left upper extremity 10/15/2016  . 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 Diverticulosis of colon without hemorrhage 02/04/2015  . Unspecified vitamin D deficiency 12/25/2013  . 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 Atherosclerosis of  native arteries of the extremities with intermittent claudication 04/20/2011  . SPINAL STENOSIS, LUMBAR 03/09/2010  . OTHER OSTEOPOROSIS 03/09/2010  . SCOLIOSIS, LUMBAR SPINE 03/09/2010  . MIGRAINE, OPHTHALMIC 10/29/2009  . LUMBAR RADICULOPATHY, ATYPICAL 10/29/2009  . POLYNEUROPATHY OTHER DISEASES CLASSIFIED ELSW 09/22/2009  . UNSPECIFIED PERIPHERAL VASCULAR DISEASE 09/22/2009  . ADENOMATOUS COLONIC POLYP 06/10/2009  . History of iron deficiency 05/06/2009  .  CAROTID ARTERY DISEASE 07/28/2008  . GASTROPARESIS 07/22/2008  . Allergic rhinitis 03/20/2008  . DYSPHAGIA 03/12/2008  . GANGLION OF TENDON SHEATH 08/17/2007  . Hypothyroidism 11/15/2006  . Hyperlipidemia 11/07/2006  . ADVEF, DRUG/MEDICINAL/BIOLOGICAL SUBST NOS 11/07/2006  . Essential hypertension 10/18/2006  . GERD 10/18/2006  . DIVERTICULOSIS, COLON 10/18/2006   Past Medical History:  Diagnosis Date  . Anemia   . Anxiety   . Diverticulitis   . Diverticulosis   . Gastroparesis   . GERD (gastroesophageal reflux disease)   . Glaucoma   . Hemorrhoids   . Hyperlipidemia   . Hypertension   . Hypertension   . IBS (irritable bowel syndrome)   . Rectal ulcer   . Segmental colitis ([place])   . Tubular adenoma of colon 05/2009    Family History  Problem Relation Age of Onset  . Diabetes Mother   . Heart disease Mother   . Hyperlipidemia Mother   . Hypertension Mother   . Anuerysm Father   . Heart disease Father   . Hypertension Father   . Migraines Daughter   . Colon polyps Sister   . Lung cancer Sister   . Colon polyps Brother   . Diabetes Brother   . Hyperlipidemia Brother   . Hypertension Brother   . Colon polyps Daughter   . Breast cancer Daughter   . Colon cancer Maternal Aunt        Rectal cancer  . Cancer Daughter        salivary  . Colon cancer Paternal Grandmother        with possible stomach cancer  . Heart disease Brother   . Crohn's disease Neg Hx   . Pancreatic cancer Neg Hx     Past Surgical History:  Procedure Laterality Date  . APPENDECTOMY    . ARCUATE KERATECTOMY    . double pallital tori bone removed     . HERNIA REPAIR     umbilical  . KNEE SURGERY Bilateral   . morton's neuroma on rt foot and platar facial release    . MOUTH SURGERY     "bone shaved off roof of mouth"  . SALIVARY GLAND SURGERY     removal  . TONSILLECTOMY     Social History   Occupational History  . Occupation: Retired  Tobacco Use  . Smoking status: Never Smoker    . Smokeless tobacco: Never Used  Substance and Sexual Activity  . Alcohol use: Yes    Alcohol/week: 0.0 standard drinks    Comment: rare  . Drug use: No  . Sexual activity: Yes

## 2018-02-13 ENCOUNTER — Other Ambulatory Visit: Payer: Self-pay

## 2018-02-13 ENCOUNTER — Ambulatory Visit (INDEPENDENT_AMBULATORY_CARE_PROVIDER_SITE_OTHER): Payer: Medicare Other | Admitting: Family Medicine

## 2018-02-13 ENCOUNTER — Other Ambulatory Visit: Payer: Medicare Other

## 2018-02-13 ENCOUNTER — Encounter: Payer: Self-pay | Admitting: Family Medicine

## 2018-02-13 VITALS — BP 120/68 | HR 50 | Temp 98.6°F | Ht 61.5 in | Wt 126.0 lb

## 2018-02-13 DIAGNOSIS — E038 Other specified hypothyroidism: Secondary | ICD-10-CM

## 2018-02-13 DIAGNOSIS — D729 Disorder of white blood cells, unspecified: Secondary | ICD-10-CM

## 2018-02-13 DIAGNOSIS — E785 Hyperlipidemia, unspecified: Secondary | ICD-10-CM

## 2018-02-13 DIAGNOSIS — I10 Essential (primary) hypertension: Secondary | ICD-10-CM

## 2018-02-13 DIAGNOSIS — K219 Gastro-esophageal reflux disease without esophagitis: Secondary | ICD-10-CM | POA: Diagnosis not present

## 2018-02-13 LAB — CBC WITH DIFFERENTIAL/PLATELET
Basophils Absolute: 0.1 10*3/uL (ref 0.0–0.1)
Basophils Relative: 0.4 % (ref 0.0–3.0)
Eosinophils Absolute: 0.2 10*3/uL (ref 0.0–0.7)
Eosinophils Relative: 1 % (ref 0.0–5.0)
HCT: 38.6 % (ref 36.0–46.0)
Hemoglobin: 13 g/dL (ref 12.0–15.0)
Lymphocytes Relative: 57.3 % — ABNORMAL HIGH (ref 12.0–46.0)
Lymphs Abs: 9.1 10*3/uL — ABNORMAL HIGH (ref 0.7–4.0)
MCHC: 33.6 g/dL (ref 30.0–36.0)
MCV: 98.9 fl (ref 78.0–100.0)
Monocytes Absolute: 0.9 10*3/uL (ref 0.1–1.0)
Monocytes Relative: 5.6 % (ref 3.0–12.0)
Neutro Abs: 5.7 10*3/uL (ref 1.4–7.7)
Neutrophils Relative %: 35.7 % — ABNORMAL LOW (ref 43.0–77.0)
Platelets: 243 10*3/uL (ref 150.0–400.0)
RBC: 3.9 Mil/uL (ref 3.87–5.11)
RDW: 13.3 % (ref 11.5–15.5)
WBC: 16 10*3/uL — ABNORMAL HIGH (ref 4.0–10.5)

## 2018-02-13 LAB — HEPATIC FUNCTION PANEL
ALT: 16 U/L (ref 0–35)
AST: 17 U/L (ref 0–37)
Albumin: 4.3 g/dL (ref 3.5–5.2)
Alkaline Phosphatase: 55 U/L (ref 39–117)
Bilirubin, Direct: 0.1 mg/dL (ref 0.0–0.3)
Total Bilirubin: 0.7 mg/dL (ref 0.2–1.2)
Total Protein: 6.1 g/dL (ref 6.0–8.3)

## 2018-02-13 LAB — LIPID PANEL
Cholesterol: 198 mg/dL (ref 0–200)
HDL: 47 mg/dL (ref >39.00)
NonHDL: 151.37
Total CHOL/HDL Ratio: 4
Triglycerides: 201 mg/dL — ABNORMAL HIGH (ref 0.0–149.0)
VLDL: 40.2 mg/dL — ABNORMAL HIGH (ref 0.0–40.0)

## 2018-02-13 LAB — BASIC METABOLIC PANEL
BUN: 15 mg/dL (ref 6–23)
CO2: 28 mEq/L (ref 19–32)
Calcium: 9.6 mg/dL (ref 8.4–10.5)
Chloride: 99 mEq/L (ref 96–112)
Creatinine, Ser: 0.86 mg/dL (ref 0.40–1.20)
GFR: 66.98 mL/min (ref >60.00)
Glucose, Bld: 104 mg/dL — ABNORMAL HIGH (ref 70–99)
Potassium: 4.1 mEq/L (ref 3.5–5.1)
Sodium: 134 mEq/L — ABNORMAL LOW (ref 135–145)

## 2018-02-13 LAB — TSH: TSH: 1.04 u[IU]/mL (ref 0.35–4.50)

## 2018-02-13 LAB — LDL CHOLESTEROL, DIRECT: Direct LDL: 126 mg/dL

## 2018-02-13 NOTE — Progress Notes (Signed)
Subjective:     Patient ID: Rebecca [name], female   DOB: 10/05/1932, 84 y.o.   MRN: [id number]  HPI Here for medical follow-up.  Her chronic problems include history of hyperlipidemia, hypothyroidism, hypertension, spinal stenosis, osteoporosis, scoliosis, history of migraine headaches and GERD.  She had epidural injection on 02/02/2018.  Her back pain is currently stable.  Recent breakthrough GERD symptoms.  When Zantac was taken off the market she has not had good control with Nexium.  She tried Pepcid but only took 10 mg.  She been taking 300 mg of Zantac at night.  No dysphagia  Osteoporosis.  She has gotten Reclast injections past 5 years per GYN.  She has been getting DEXA scans through their office.  Also gets yearly mammograms.  Flu vaccine already given.  Hypertension is been stable.  No dizziness.  No chest pains.  Remains on simvastatin for hyperlipidemia.  No myalgias.  Past Medical History:  Diagnosis Date  . Anemia   . Anxiety   . Diverticulitis   . Diverticulosis   . Gastroparesis   . GERD (gastroesophageal reflux disease)   . Glaucoma   . Hemorrhoids   . Hyperlipidemia   . Hypertension   . Hypertension   . IBS (irritable bowel syndrome)   . Rectal ulcer   . Segmental colitis ([place])   . Tubular adenoma of colon 05/2009   Past Surgical History:  Procedure Laterality Date  . APPENDECTOMY    . ARCUATE KERATECTOMY    . double pallital tori bone removed     . HERNIA REPAIR     umbilical  . KNEE SURGERY Bilateral   . morton's neuroma on rt foot and platar facial release    . MOUTH SURGERY     "bone shaved off roof of mouth"  . SALIVARY GLAND SURGERY     removal  . TONSILLECTOMY      reports that she has never smoked. She has never used smokeless tobacco. She reports that she drinks alcohol. She reports that she does not use drugs. family history includes Anuerysm in her father; Breast cancer in her daughter; Cancer in her daughter; Colon cancer in her  maternal aunt and paternal grandmother; Colon polyps in her brother, daughter, and sister; Diabetes in her brother and mother; Heart disease in her brother, father, and mother; Hyperlipidemia in her brother and mother; Hypertension in her brother, father, and mother; Lung cancer in her sister; Migraines in her daughter. Allergies  Allergen Reactions  . Levaquin [Levofloxacin] Other (See Comments)    Very nauseous and shakey  . Sulfamethoxazole-Trimethoprim Nausea Only    shakey  . Lactose Intolerance (Gi) Diarrhea and Other (See Comments)    Stomach hurts   . Tramadol Hcl Nausea Only and Palpitations  . Augmentin [Amoxicillin-Pot Clavulanate] Diarrhea  . Ciprofloxacin Nausea Only    REACTION: Thrush, Nausea, Shakey  . Pneumococcal Vaccine Polyvalent     REACTION: RED AND RAISED RASH ON ARM  . Adhesive [Tape] Rash  . Codeine Nausea Only     Review of Systems  Constitutional: Negative for fatigue.  Eyes: Negative for visual disturbance.  Respiratory: Negative for cough, chest tightness, shortness of breath and wheezing.   Cardiovascular: Negative for chest pain, palpitations and leg swelling.  Neurological: Negative for dizziness, seizures, syncope, weakness, light-headedness and headaches.       Objective:   Physical Exam  Constitutional: She appears well-developed and well-nourished.  Eyes: Pupils are equal, round, and reactive to light.  Neck:  Neck supple. No JVD present. No thyromegaly present.  Cardiovascular: Normal rate and regular rhythm. Exam reveals no gallop.  Pulmonary/Chest: Effort normal and breath sounds normal. No respiratory distress. She has no wheezes. She has no rales.  Musculoskeletal: She exhibits no edema.  has prominent scoliosis of the spine  Neurological: She is alert.       Assessment:     #1 hypertension stable and at goal  #2 hypothyroidism  #3 dyslipidemia  #4 GERD.  Patient has had some recent breakthrough symptoms since Zantac was taken  off the market    Plan:     -Check further labs with CBC, TSH, lipid panel, hepatic panel, basic metabolic panel -Try over-the-counter Pepcid 20 mg once or twice daily as needed for breakthrough GERD symptoms -Continue regular weightbearing exercise -Continue with yearly flu vaccine  [name] MD [facility]

## 2018-02-13 NOTE — Patient Instructions (Signed)
Consider Pepcid 20 mg one to two daily as needed for reflux.

## 2018-02-14 LAB — PATHOLOGIST SMEAR REVIEW

## 2018-02-19 ENCOUNTER — Other Ambulatory Visit: Payer: Self-pay

## 2018-02-19 DIAGNOSIS — D72820 Lymphocytosis (symptomatic): Secondary | ICD-10-CM

## 2018-02-22 DIAGNOSIS — N952 Postmenopausal atrophic vaginitis: Secondary | ICD-10-CM | POA: Diagnosis not present

## 2018-02-22 DIAGNOSIS — N8111 Cystocele, midline: Secondary | ICD-10-CM | POA: Diagnosis not present

## 2018-02-22 DIAGNOSIS — N816 Rectocele: Secondary | ICD-10-CM | POA: Diagnosis not present

## 2018-02-22 DIAGNOSIS — B373 Candidiasis of vulva and vagina: Secondary | ICD-10-CM | POA: Diagnosis not present

## 2018-02-23 ENCOUNTER — Other Ambulatory Visit: Payer: Self-pay | Admitting: Family Medicine

## 2018-02-23 NOTE — Telephone Encounter (Signed)
Last refill given on 10/8 #20 with 1 ref

## 2018-03-03 ENCOUNTER — Encounter: Payer: Self-pay | Admitting: Family Medicine

## 2018-03-03 ENCOUNTER — Ambulatory Visit (INDEPENDENT_AMBULATORY_CARE_PROVIDER_SITE_OTHER): Payer: Medicare Other | Admitting: Family Medicine

## 2018-03-03 ENCOUNTER — Other Ambulatory Visit (HOSPITAL_COMMUNITY)
Admission: RE | Admit: 2018-03-03 | Discharge: 2018-03-03 | Disposition: A | Discharge status: Home / Self Care | Payer: Medicare Other | Source: Other Acute Inpatient Hospital | Attending: Family Medicine | Admitting: Family Medicine

## 2018-03-03 DIAGNOSIS — N390 Urinary tract infection, site not specified: Secondary | ICD-10-CM | POA: Diagnosis not present

## 2018-03-03 DIAGNOSIS — I10 Essential (primary) hypertension: Secondary | ICD-10-CM | POA: Diagnosis not present

## 2018-03-03 MED ORDER — CEFDINIR 300 MG PO CAPS: 300.0000 mg | ORAL_CAPSULE | Freq: Two times a day (BID) | ORAL | 0 refills | Status: AC

## 2018-03-03 NOTE — Assessment & Plan Note (Signed)
Long history of UTIs but none recently. Has had 4-5 days of worsening lower abdominal pain and malaise. She has brought in a sample from home and sent the jar through the dishwasher and boiled it. She notes she has done this previously since she has trouble urinating in the office. Will treat with Cefdinir and send urine for culture.

## 2018-03-03 NOTE — Patient Instructions (Signed)

## 2018-03-03 NOTE — Progress Notes (Signed)
Subjective:    Patient ID: Rebecca [name], female    DOB: 05-12-1932, 84 y.o.   MRN: [id number]  No chief complaint on file.   HPI Patient is in today for evaluation of a roughly  Day history of lower abdominal pain. She has a history of recurrent UTIs. Has not had one recently but about 4-5 days. Notes some malaise, anorexia and urinary hesitancy as well. No dysuria or hematuria. No obvious fevers or chills. She has brought in samples after sterilizing the bottles int he dishwasher and boiling with PCP in past so she brings one today because she has trouble urinating in the office.   Past Medical History:  Diagnosis Date  . Anemia   . Anxiety   . Diverticulitis   . Diverticulosis   . Gastroparesis   . GERD (gastroesophageal reflux disease)   . Glaucoma   . Hemorrhoids   . Hyperlipidemia   . Hypertension   . Hypertension   . IBS (irritable bowel syndrome)   . Rectal ulcer   . Segmental colitis ([place])   . Tubular adenoma of colon 05/2009    Past Surgical History:  Procedure Laterality Date  . APPENDECTOMY    . ARCUATE KERATECTOMY    . double pallital tori bone removed     . HERNIA REPAIR     umbilical  . KNEE SURGERY Bilateral   . morton's neuroma on rt foot and platar facial release    . MOUTH SURGERY     "bone shaved off roof of mouth"  . SALIVARY GLAND SURGERY     removal  . TONSILLECTOMY      Family History  Problem Relation Age of Onset  . Diabetes Mother   . Heart disease Mother   . Hyperlipidemia Mother   . Hypertension Mother   . Anuerysm Father   . Heart disease Father   . Hypertension Father   . Migraines Daughter   . Colon polyps Sister   . Lung cancer Sister   . Colon polyps Brother   . Diabetes Brother   . Hyperlipidemia Brother   . Hypertension Brother   . Colon polyps Daughter   . Breast cancer Daughter   . Colon cancer Maternal Aunt        Rectal cancer  . Cancer Daughter        salivary  . Colon cancer Paternal Grandmother    with possible stomach cancer  . Heart disease Brother   . Crohn's disease Neg Hx   . Pancreatic cancer Neg Hx     Social History   Socioeconomic History  . Marital status: Widowed    Spouse name: Not on file  . Number of children: 3  . Years of education: Not on file  . Highest education level: Not on file  Occupational History  . Occupation: Retired  Scientific laboratory technician  . Financial resource strain: Not on file  . Food insecurity:    Worry: Not on file    Inability: Not on file  . Transportation needs:    Medical: Not on file    Non-medical: Not on file  Tobacco Use  . Smoking status: Never Smoker  . Smokeless tobacco: Never Used  Substance and Sexual Activity  . Alcohol use: Yes    Alcohol/week: 0.0 standard drinks    Comment: rare  . Drug use: No  . Sexual activity: Yes  Lifestyle  . Physical activity:    Days per week: Not on file  Minutes per session: Not on file  . Stress: Not on file  Relationships  . Social connections:    Talks on phone: Not on file    Gets together: Not on file    Attends religious service: Not on file    Active member of club or organization: Not on file    Attends meetings of clubs or organizations: Not on file    Relationship status: Not on file  . Intimate partner violence:    Fear of current or ex partner: Not on file    Emotionally abused: Not on file    Physically abused: Not on file    Forced sexual activity: Not on file  Other Topics Concern  . Not on file  Social History Narrative  . Not on file    Outpatient Medications Prior to Visit  Medication Sig Dispense Refill  . ALPRAZolam (XANAX) 0.25 MG tablet TAKE 1 TABLET BY MOUTH AT BEDTIME AS NEEDED 20 tablet 0  . Artificial Tear Solution (GENTEAL TEARS) 0.1-0.2-0.3 % SOLN Place 2 drops into both eyes at bedtime.    . Calcium Carbonate-Vitamin D (CALCIUM-VITAMIN D) 500-200 MG-UNIT per tablet Take 1 tablet by mouth 2 (two) times daily with a meal.      . Cholecalciferol  (VITAMIN D3) 2000 UNITS TABS Take 1 tablet by mouth daily.     . diclofenac sodium (VOLTAREN) 1 % GEL Apply 2 g topically 4 (four) times daily. 1 Tube 1  . esomeprazole (NEXIUM) 20 MG capsule Take 20 mg by mouth 2 (two) times daily before a meal.    . gabapentin (NEURONTIN) 100 MG capsule Take 2 capsules (200 mg total) by mouth at bedtime. TAKE 2 CAPSULES BY MOUTH at bedtime 180 capsule 3  . losartan (COZAAR) 100 MG tablet TAKE 1 TABLET BY MOUTH ONCE DAILY 90 tablet 0  . metoCLOPramide (REGLAN) 5 MG tablet TAKE 1 TABLET BY MOUTH WITH MEALS TWICE A DAY 60 tablet 2  . simvastatin (ZOCOR) 20 MG tablet TAKE 1 TABLET BY MOUTH AT BEDTIME 90 tablet 1  . SYNTHROID 88 MCG tablet TAKE 1 TABLET BY MOUTH ONCE DAILY 90 tablet 0  . vitamin B-12 (CYANOCOBALAMIN) 500 MCG tablet Take 500 mcg by mouth every morning.     . vitamin C (ASCORBIC ACID) 500 MG tablet Take 500 mg by mouth daily.    . vitamin E (VITAMIN E) 400 UNIT capsule Take 400 Units by mouth daily.    . Zoledronic Acid (RECLAST IV) Inject into the vein. Once per year.     No facility-administered medications prior to visit.     Allergies  Allergen Reactions  . Levaquin [Levofloxacin] Other (See Comments)    Very nauseous and shakey  . Sulfamethoxazole-Trimethoprim Nausea Only    shakey  . Lactose Intolerance (Gi) Diarrhea and Other (See Comments)    Stomach hurts   . Tramadol Hcl Nausea Only and Palpitations  . Augmentin [Amoxicillin-Pot Clavulanate] Diarrhea  . Ciprofloxacin Nausea Only    REACTION: Thrush, Nausea, Shakey  . Pneumococcal Vaccine Polyvalent     REACTION: RED AND RAISED RASH ON ARM  . Adhesive [Tape] Rash  . Codeine Nausea Only    Review of Systems  Constitutional: Negative for fever and malaise/fatigue.  HENT: Negative for congestion.   Eyes: Negative for blurred vision.  Respiratory: Negative for shortness of breath.   Cardiovascular: Negative for chest pain, palpitations and leg swelling.  Gastrointestinal:  Positive for abdominal pain. Negative for blood in stool, constipation,  diarrhea, melena and nausea.  Genitourinary: Negative for dysuria, flank pain, frequency, hematuria and urgency.  Musculoskeletal: Negative for falls.  Skin: Negative for rash.  Neurological: Negative for dizziness, loss of consciousness and headaches.  Endo/Heme/Allergies: Negative for environmental allergies.  Psychiatric/Behavioral: Negative for depression. The patient is not nervous/anxious.        Objective:    Physical Exam  Constitutional: She is oriented to person, place, and time. She appears well-developed and well-nourished. No distress.  HENT:  Head: Normocephalic and atraumatic.  Nose: Nose normal.  Eyes: Right eye exhibits no discharge. Left eye exhibits no discharge.  Neck: Normal range of motion. Neck supple.  Cardiovascular: Normal rate and regular rhythm.  No murmur heard. Pulmonary/Chest: Effort normal and breath sounds normal.  Abdominal: Soft. Bowel sounds are normal. There is no tenderness.  Musculoskeletal: She exhibits no edema.  Neurological: She is alert and oriented to person, place, and time.  Skin: Skin is warm and dry.  Psychiatric: She has a normal mood and affect.  Nursing note and vitals reviewed.   BP 118/70   Pulse (!) 53   Temp 97.6 F (36.4 C) (Oral)   Ht 5' 1.5" (1.562 m)   Wt 126 lb (57.2 kg)   SpO2 99%   BMI 23.42 kg/m  Wt Readings from Last 3 Encounters:  03/03/18 126 lb (57.2 kg)  02/13/18 126 lb (57.2 kg)  02/09/18 125 lb (56.7 kg)     Lab Results  Component Value Date   WBC 16.0 (H) 02/13/2018   HGB 13.0 02/13/2018   HCT 38.6 02/13/2018   PLT 243.0 02/13/2018   GLUCOSE 104 (H) 02/13/2018   CHOL 198 02/13/2018   TRIG 201.0 (H) 02/13/2018   HDL 47.00 02/13/2018   LDLDIRECT 126.0 02/13/2018   LDLCALC 130 (H) 12/25/2013   ALT 16 02/13/2018   AST 17 02/13/2018   NA 134 (L) 02/13/2018   K 4.1 02/13/2018   CL 99 02/13/2018   CREATININE 0.86  02/13/2018   BUN 15 02/13/2018   CO2 28 02/13/2018   TSH 1.04 02/13/2018   HGBA1C 5.6 07/26/2006    Lab Results  Component Value Date   TSH 1.04 02/13/2018   Lab Results  Component Value Date   WBC 16.0 (H) 02/13/2018   HGB 13.0 02/13/2018   HCT 38.6 02/13/2018   MCV 98.9 02/13/2018   PLT 243.0 02/13/2018   Lab Results  Component Value Date   NA 134 (L) 02/13/2018   K 4.1 02/13/2018   CO2 28 02/13/2018   GLUCOSE 104 (H) 02/13/2018   BUN 15 02/13/2018   CREATININE 0.86 02/13/2018   BILITOT 0.7 02/13/2018   ALKPHOS 55 02/13/2018   AST 17 02/13/2018   ALT 16 02/13/2018   PROT 6.1 02/13/2018   ALBUMIN 4.3 02/13/2018   CALCIUM 9.6 02/13/2018   ANIONGAP 7 02/23/2016   GFR 66.98 02/13/2018   Lab Results  Component Value Date   CHOL 198 02/13/2018   Lab Results  Component Value Date   HDL 47.00 02/13/2018   Lab Results  Component Value Date   LDLCALC 130 (H) 12/25/2013   Lab Results  Component Value Date   TRIG 201.0 (H) 02/13/2018   Lab Results  Component Value Date   CHOLHDL 4 02/13/2018   Lab Results  Component Value Date   HGBA1C 5.6 07/26/2006       Assessment & Plan:   Problem List Items Addressed This Visit    Essential hypertension  Well controlled, no changes to meds. Encouraged heart healthy diet such as the DASH diet and exercise as tolerated.       UTI (urinary tract infection)    Long history of UTIs but none recently. Has had 4-5 days of worsening lower abdominal pain and malaise. She has brought in a sample from home and sent the jar through the dishwasher and boiled it. She notes she has done this previously since she has trouble urinating in the office. Will treat with Cefdinir and send urine for culture.       Relevant Medications   cefdinir (OMNICEF) 300 MG capsule      I am having Sharetha B. Mcnabb start on cefdinir. I am also having her maintain her calcium-vitamin D, Vitamin D3, vitamin B-12, esomeprazole, vitamin C,  vitamin E, Zoledronic Acid (RECLAST IV), GENTEAL TEARS, diclofenac sodium, simvastatin, metoCLOPramide, SYNTHROID, losartan, gabapentin, and ALPRAZolam.  Meds ordered this encounter  Medications  . cefdinir (OMNICEF) 300 MG capsule    Sig: Take 1 capsule (300 mg total) by mouth 2 (two) times daily for 10 days.    Dispense:  14 capsule    Refill:  0     Penni Homans, MD

## 2018-03-03 NOTE — Assessment & Plan Note (Signed)
Well controlled, no changes to meds. Encouraged heart healthy diet such as the DASH diet and exercise as tolerated.  °

## 2018-03-04 LAB — URINE CULTURE: Culture: NO GROWTH

## 2018-03-14 ENCOUNTER — Other Ambulatory Visit: Payer: Medicare Other

## 2018-03-15 ENCOUNTER — Encounter: Payer: Self-pay | Admitting: Adult Health

## 2018-03-15 ENCOUNTER — Ambulatory Visit (INDEPENDENT_AMBULATORY_CARE_PROVIDER_SITE_OTHER): Payer: Medicare Other | Admitting: Adult Health

## 2018-03-15 VITALS — BP 158/88 | HR 64 | Temp 98.0°F | Wt 125.0 lb

## 2018-03-15 DIAGNOSIS — J40 Bronchitis, not specified as acute or chronic: Secondary | ICD-10-CM | POA: Diagnosis not present

## 2018-03-15 MED ORDER — IPRATROPIUM-ALBUTEROL 0.5-2.5 (3) MG/3ML IN SOLN: 3.0000 mL | Freq: Once | RESPIRATORY_TRACT | Status: DC

## 2018-03-15 MED ORDER — BENZONATATE 200 MG PO CAPS: 200.0000 mg | ORAL_CAPSULE | Freq: Two times a day (BID) | ORAL | 0 refills | Status: DC | PRN

## 2018-03-15 MED ORDER — IPRATROPIUM-ALBUTEROL 0.5-2.5 (3) MG/3ML IN SOLN
3.0000 mL | Freq: Once | RESPIRATORY_TRACT | Status: AC
  Administered 2018-03-15: 3 mL via RESPIRATORY_TRACT

## 2018-03-15 MED ORDER — AZITHROMYCIN 250 MG PO TABS: ORAL_TABLET | ORAL | 0 refills | Status: DC

## 2018-03-15 NOTE — Addendum Note (Signed)
Addended by: Miles Costain T on: 03/15/2018 01:23 PM   Modules accepted: Orders

## 2018-03-15 NOTE — Progress Notes (Signed)
Subjective:    Patient ID: Rebecca [name], female    DOB: 03-30-1933, 84 y.o.   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  She is a patient of Dr. Elease Hashimoto who I am seeing today for an acute issue.   She reports that her symptoms started about 6 days ago. She reports that he symptoms include that of shortness of breath, semi productive cough, wheezing, chest congestion, and nasal congestion. She has been using Mucinex Severe cough and Congestion which helps right when she takes it. Her cough is constant and is keeping her awake at night   She denies fevers, chills, n/v/d    Review of Systems See HPI   Past Medical History:  Diagnosis Date  . Anemia   . Anxiety   . Diverticulitis   . Diverticulosis   . Gastroparesis   . GERD (gastroesophageal reflux disease)   . Glaucoma   . Hemorrhoids   . Hyperlipidemia   . Hypertension   . Hypertension   . IBS (irritable bowel syndrome)   . Rectal ulcer   . 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 Tubular adenoma of colon 05/2009    Social History   Socioeconomic History  . Marital status: Widowed    Spouse name: Not on file  . Number of children: 3  . Years of education: Not on file  . Highest education level: Not on file  Occupational History  . Occupation: Retired  Scientific laboratory technician  . Financial resource strain: Not on file  . Food insecurity:    Worry: Not on file    Inability: Not on file  . Transportation needs:    Medical: Not on file    Non-medical: Not on file  Tobacco Use  . Smoking status: Never Smoker  . Smokeless tobacco: Never Used  Substance and Sexual Activity  . Alcohol use: Yes    Alcohol/week: 0.0 standard  drinks    Comment: rare  . Drug use: No  . Sexual activity: Yes  Lifestyle  . Physical activity:    Days per week: Not on file    Minutes per session: Not on file  . Stress: Not on file  Relationships  . Social connections:    Talks on phone: Not on file    Gets together: Not on file    Attends religious service: Not on file    Active member of club or organization: Not on file    Attends meetings of clubs or organizations: Not on file    Relationship status: Not on file  . Intimate partner violence:    Fear of current or ex partner: Not on file    Emotionally abused: Not on file    Physically abused: Not on file    Forced sexual activity: Not on file  Other Topics Concern  . Not on file  Social History Narrative  . Not on file    Past Surgical History:  Procedure Laterality Date  . APPENDECTOMY    . ARCUATE KERATECTOMY    . double pallital tori bone removed     . HERNIA REPAIR     umbilical  . KNEE SURGERY Bilateral   . morton's neuroma on rt foot and platar facial release    . MOUTH SURGERY     "  bone shaved off roof of mouth"  . SALIVARY GLAND SURGERY     removal  . TONSILLECTOMY      Family History  Problem Relation Age of Onset  . Diabetes Mother   . Heart disease Mother   . Hyperlipidemia Mother   . Hypertension Mother   . Anuerysm Father   . Heart disease Father   . Hypertension Father   . Migraines Daughter   . Colon polyps Sister   . Lung cancer Sister   . Colon polyps Brother   . Diabetes Brother   . Hyperlipidemia Brother   . Hypertension Brother   . Colon polyps Daughter   . Breast cancer Daughter   . Colon cancer Maternal Aunt        Rectal cancer  . Cancer Daughter        salivary  . Colon cancer Paternal Grandmother        with possible stomach cancer  . Heart disease Brother   . Crohn's disease Neg Hx   . Pancreatic cancer Neg Hx     Allergies  Allergen Reactions  . Levaquin [Levofloxacin] Other (See Comments)    Very nauseous  and shakey  . Sulfamethoxazole-Trimethoprim Nausea Only    shakey  . Lactose Intolerance (Gi) Diarrhea and Other (See Comments)    Stomach hurts   . Tramadol Hcl Nausea Only and Palpitations  . Augmentin [Amoxicillin-Pot Clavulanate] Diarrhea  . Ciprofloxacin Nausea Only    REACTION: Thrush, Nausea, Shakey  . Pneumococcal Vaccine Polyvalent     REACTION: RED AND RAISED RASH ON ARM  . Adhesive [Tape] Rash  . Codeine Nausea Only    Current Outpatient Medications on File Prior to Visit  Medication Sig Dispense Refill  . ALPRAZolam (XANAX) 0.25 MG tablet TAKE 1 TABLET BY MOUTH AT BEDTIME AS NEEDED 20 tablet 0  . Artificial Tear Solution (GENTEAL TEARS) 0.1-0.2-0.3 % SOLN Place 2 drops into both eyes at bedtime.    . Calcium Carbonate-Vitamin D (CALCIUM-VITAMIN D) 500-200 MG-UNIT per tablet Take 1 tablet by mouth 2 (two) times daily with a meal.      . Cholecalciferol (VITAMIN D3) 2000 UNITS TABS Take 1 tablet by mouth daily.     . diclofenac sodium (VOLTAREN) 1 % GEL Apply 2 g topically 4 (four) times daily. 1 Tube 1  . esomeprazole (NEXIUM) 20 MG capsule Take 20 mg by mouth 2 (two) times daily before a meal.    . gabapentin (NEURONTIN) 100 MG capsule Take 2 capsules (200 mg total) by mouth at bedtime. TAKE 2 CAPSULES BY MOUTH at bedtime 180 capsule 3  . losartan (COZAAR) 100 MG tablet TAKE 1 TABLET BY MOUTH ONCE DAILY 90 tablet 0  . metoCLOPramide (REGLAN) 5 MG tablet TAKE 1 TABLET BY MOUTH WITH MEALS TWICE A DAY 60 tablet 2  . simvastatin (ZOCOR) 20 MG tablet TAKE 1 TABLET BY MOUTH AT BEDTIME 90 tablet 1  . SYNTHROID 88 MCG tablet TAKE 1 TABLET BY MOUTH ONCE DAILY 90 tablet 0  . vitamin B-12 (CYANOCOBALAMIN) 500 MCG tablet Take 500 mcg by mouth every morning.     . vitamin C (ASCORBIC ACID) 500 MG tablet Take 500 mg by mouth daily.    . vitamin E (VITAMIN E) 400 UNIT capsule Take 400 Units by mouth daily.    . Zoledronic Acid (RECLAST IV) Inject into the vein. Once per year.    .  [DISCONTINUED] olmesartan (BENICAR) 20 MG tablet Take 20 mg by mouth daily.  No current facility-administered medications on file prior to visit.     BP (!) 158/88 Comment: Pt has not had BP meds today  Pulse 64   Temp 98 F (36.7 C)   Wt 125 lb (56.7 kg)   SpO2 98%   BMI 23.24 kg/m       Objective:   Physical Exam Vitals signs and nursing note reviewed.  Constitutional:      Appearance: Normal appearance.  HENT:     Nose: Nose normal. No congestion or rhinorrhea.     Mouth/Throat:     Mouth: Mucous membranes are moist.     Pharynx: Oropharynx is clear.  Neck:     Musculoskeletal: Normal range of motion and neck supple.  Cardiovascular:     Rate and Rhythm: Normal rate and regular rhythm.  Pulmonary:     Effort: Pulmonary effort is normal.     Breath sounds: Wheezing (expiratory throughout ) present. No rhonchi or rales.  Musculoskeletal: Normal range of motion.  Skin:    General: Skin is warm and dry.  Neurological:     General: No focal deficit present.     Mental Status: She is alert and oriented to person, place, and time.  Psychiatric:        Mood and Affect: Mood normal.        Behavior: Behavior normal.        Thought Content: Thought content normal.        Judgment: Judgment normal.       Assessment & Plan:  1. Bronchitis - benzonatate (TESSALON) 200 MG capsule; Take 1 capsule (200 mg total) by mouth 2 (two) times daily as needed for cough.  Dispense: 20 capsule; Refill: 0 - ipratropium-albuterol (DUONEB) 0.5-2.5 (3) MG/3ML nebulizer solution 3 mL - azithromycin (ZITHROMAX Z-PAK) 250 MG tablet; Take 2 tablets on Day 1.  Then take 1 tablet daily.  Dispense: 6 tablet; Refill: 0  - patient reported improvement in her symptoms after duoneb and felt as though she could breath easier. Wheezing had resolved.   Dorothyann Peng, NP

## 2018-03-17 DIAGNOSIS — J209 Acute bronchitis, unspecified: Secondary | ICD-10-CM | POA: Diagnosis not present

## 2018-03-20 ENCOUNTER — Telehealth: Payer: Self-pay | Admitting: Family Medicine

## 2018-03-20 ENCOUNTER — Ambulatory Visit (INDEPENDENT_AMBULATORY_CARE_PROVIDER_SITE_OTHER): Payer: Medicare Other | Admitting: Family Medicine

## 2018-03-20 ENCOUNTER — Other Ambulatory Visit: Payer: Self-pay

## 2018-03-20 ENCOUNTER — Encounter: Payer: Self-pay | Admitting: Family Medicine

## 2018-03-20 ENCOUNTER — Ambulatory Visit: Payer: Self-pay

## 2018-03-20 VITALS — BP 124/70 | HR 70 | Temp 98.3°F | Ht 61.5 in | Wt 124.7 lb

## 2018-03-20 DIAGNOSIS — R062 Wheezing: Secondary | ICD-10-CM

## 2018-03-20 MED ORDER — METHYLPREDNISOLONE ACETATE 80 MG/ML IJ SUSP
80.0000 mg | Freq: Once | INTRAMUSCULAR | Status: AC
  Administered 2018-03-20: 80 mg via INTRAMUSCULAR

## 2018-03-20 NOTE — Telephone Encounter (Signed)
Pt c/o wheezing (that can be heard over the phone during the triage call) and cough. Pt was seen in office 03/15/18 diagnosed with bronchitits and was prescribed tessalon, duoneb and z pack). Pt stated she finished her Z pack yesterday.  Over the weekend she went to [facility]. Pt having occasional productive cough with white phlegm and runny nose. Denies fever or chest pain.   Pt advised to go to the ED. Pt refused. Called [facility] Ria Comment ans she asked NT to send note to office so they can try and arrange appointment. Pt informed and verbalized understanding. Reason for Disposition . Wheezing can be heard across the room  Answer Assessment - Initial Assessment Questions 1. RESPIRATORY STATUS: "Describe your breathing?" (e.g., wheezing, shortness of breath, unable to speak, severe coughing)      Wheezing, cough 2. ONSET: "When did this breathing problem begin?"      1 week 3. PATTERN "Does the difficult breathing come and go, or has it been constant since it started?"      constant 4. SEVERITY: "How bad is your breathing?" (e.g., mild, moderate, severe)    - MILD: No SOB at rest, mild SOB with walking, speaks normally in sentences, can lay down, no retractions, pulse < 100.    - MODERATE: SOB at rest, SOB with minimal exertion and prefers to sit, cannot lie down flat, speaks in phrases, mild retractions, audible wheezing, pulse 100-120.    - SEVERE: Very SOB at rest, speaks in single words, struggling to breathe, sitting hunched forward, retractions, pulse > 120   Moderate 5. RECURRENT SYMPTOM: "Have you had difficulty breathing before?" If so, ask: "When was the last time?" and "What happened that time?"      no 6. CARDIAC HISTORY: "Do you have any history of heart disease?" (e.g., heart attack, angina, bypass surgery, angioplasty)      no 7. LUNG HISTORY: "Do you have any history of lung disease?"  (e.g., pulmonary embolus, asthma, emphysema)     no 8. CAUSE: "What do you think is causing the  breathing problem?"      bronchitis 9. OTHER SYMPTOMS: "Do you have any other symptoms? (e.g., dizziness, runny nose, cough, chest pain, fever) Cough, runny nose, occasional productive cough (white) 10. PREGNANCY: "Is there any chance you are pregnant?" "When was your last menstrual period?"       n/a 11. TRAVEL: "Have you traveled out of the country in the last month?" (e.g., travel history, exposures)       no  Protocols used: BREATHING DIFFICULTY-A-AH

## 2018-03-20 NOTE — Telephone Encounter (Signed)
I recommend follow up if still wheezing.  May need to consider steroids.

## 2018-03-20 NOTE — Telephone Encounter (Signed)
Please see message.  Please advise. 

## 2018-03-20 NOTE — Telephone Encounter (Signed)
Called patient and was able to get her in with Dr. Elease Hashimoto at 3:20pm today for an appointment. OK per Dr. Elease Hashimoto. Patient verbalized an understanding and will be her by 3:15pm.

## 2018-03-20 NOTE — Patient Instructions (Signed)
Follow up for any fever or persistent wheezing.   Continue the Albuterol as needed.

## 2018-03-20 NOTE — Telephone Encounter (Signed)
Copied from [place] [phone]. Topic: Quick Communication - See Telephone Encounter >> Mar 20, 2018  8:37 AM Valla Leaver wrote: CRM for notification. See Telephone encounter for: 03/20/18. Patient is still experiencing wheezing after treatment for bronchitis last Thursday and wants a call back from Cory's cma to get advice on whether that is normal?

## 2018-03-20 NOTE — Progress Notes (Signed)
Subjective:     Patient ID: Rebecca [name], female   DOB: 1933-01-21, 84 y.o.   MRN: [id number]  HPI   Patient had recent cold-like symptoms about a couple weeks ago.  Last week she developed some increased cough and wheezing.  No history of asthma or COPD.  She came here last Thursday and received Zithromax, nebulizer with DuoNeb, and Tessalon.  Her wheezing persisted and she went to [facility] walk-in clinic on Saturday and was given another nebulizer and prescribed albuterol.  Cough is mostly nonproductive.  Wheezing is intermittent.  No fevers or chills.  Past Medical History:  Diagnosis Date  . Anemia   . Anxiety   . Diverticulitis   . Diverticulosis   . Gastroparesis   . GERD (gastroesophageal reflux disease)   . Glaucoma   . Hemorrhoids   . Hyperlipidemia   . Hypertension   . Hypertension   . IBS (irritable bowel syndrome)   . Rectal ulcer   . 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 Tubular adenoma of colon 05/2009   Past Surgical History:  Procedure Laterality Date  . APPENDECTOMY    . ARCUATE KERATECTOMY    . double pallital tori bone removed     . HERNIA REPAIR     umbilical  . KNEE SURGERY Bilateral   . morton's neuroma on rt foot and platar facial release    . MOUTH SURGERY     "bone shaved off roof of mouth"  . SALIVARY GLAND SURGERY     removal  . TONSILLECTOMY      reports that she has never smoked. She has never used smokeless tobacco. She reports current alcohol use. She reports that she does not use drugs. family history includes Anuerysm in her father; Breast cancer in her daughter; Cancer in her daughter; Colon cancer in her maternal aunt and paternal grandmother; Colon polyps in her brother, daughter, and sister; Diabetes in her brother and mother; Heart disease in her brother, father, and mother; Hyperlipidemia in her brother and mother; Hypertension in her brother, father, and mother; Lung cancer in her sister; Migraines in her daughter. Allergies  Allergen  Reactions  . Levaquin [Levofloxacin] Other (See Comments)    Very nauseous and shakey  . Sulfamethoxazole-Trimethoprim Nausea Only    shakey  . Lactose Intolerance (Gi) Diarrhea and Other (See Comments)    Stomach hurts   . Tramadol Hcl Nausea Only and Palpitations  . Augmentin [Amoxicillin-Pot Clavulanate] Diarrhea  . Ciprofloxacin Nausea Only    REACTION: Thrush, Nausea, Shakey  . Pneumococcal Vaccine Polyvalent     REACTION: RED AND RAISED RASH ON ARM  . Adhesive [Tape] Rash  . Codeine Nausea Only     Review of Systems  Constitutional: Negative for chills and fever.  Respiratory: Positive for cough and wheezing.   Cardiovascular: Negative for chest pain, palpitations and leg swelling.       Objective:   Physical Exam Constitutional:      Appearance: She is well-developed.  Cardiovascular:     Rate and Rhythm: Normal rate and regular rhythm.  Pulmonary:     Effort: Pulmonary effort is normal.     Comments: Has some mild diffuse wheezes.  No retractions.  Pulse oximetry 95%. Musculoskeletal:     Right lower leg: No edema.  Neurological:     Mental Status: She is alert.        Assessment:     Intermittent wheezing following probable viral URI.  She does not have any  red flags such as fever or any rales on exam    Plan:     -Continue albuterol MDI as needed -Depo-Medrol 80 mg IM -Touch base if cough and wheezing not resolving over the next week and follow-up promptly for any fever or worsening symptoms  [name] MD [facility]

## 2018-03-22 ENCOUNTER — Other Ambulatory Visit: Payer: Self-pay | Admitting: Family Medicine

## 2018-04-11 ENCOUNTER — Other Ambulatory Visit: Payer: Self-pay | Admitting: Family Medicine

## 2018-04-17 ENCOUNTER — Other Ambulatory Visit: Payer: Self-pay | Admitting: Family Medicine

## 2018-04-17 NOTE — Telephone Encounter (Signed)
I recommend she try to get away from prn use of benzodiazepines, if possible, given higher risk of falls and fractures in older adults.

## 2018-04-17 NOTE — Telephone Encounter (Signed)
Last OV 03/20/18, No future OV  Last filled 02/24/18, # 20 with 0 refills

## 2018-04-25 ENCOUNTER — Other Ambulatory Visit: Payer: Self-pay | Admitting: *Deleted

## 2018-04-25 ENCOUNTER — Telehealth: Payer: Self-pay

## 2018-04-25 NOTE — Telephone Encounter (Signed)
Refill once 

## 2018-04-25 NOTE — Telephone Encounter (Signed)
Author phoned pt. to set up subsequent AWV. Appointment made for 2/5.

## 2018-04-27 MED ORDER — ALPRAZOLAM 0.25 MG PO TABS: 0.2500 mg | ORAL_TABLET | Freq: Every evening | ORAL | 0 refills | Status: DC | PRN

## 2018-04-30 ENCOUNTER — Other Ambulatory Visit: Payer: Self-pay | Admitting: Family Medicine

## 2018-04-30 DIAGNOSIS — N8111 Cystocele, midline: Secondary | ICD-10-CM | POA: Diagnosis not present

## 2018-04-30 DIAGNOSIS — N816 Rectocele: Secondary | ICD-10-CM | POA: Diagnosis not present

## 2018-05-01 ENCOUNTER — Other Ambulatory Visit (INDEPENDENT_AMBULATORY_CARE_PROVIDER_SITE_OTHER): Payer: Medicare Other

## 2018-05-01 DIAGNOSIS — D72820 Lymphocytosis (symptomatic): Secondary | ICD-10-CM | POA: Diagnosis not present

## 2018-05-01 LAB — CBC WITH DIFFERENTIAL/PLATELET
Basophils Absolute: 0.1 10*3/uL (ref 0.0–0.1)
Basophils Relative: 0.6 % (ref 0.0–3.0)
Eosinophils Absolute: 0.2 10*3/uL (ref 0.0–0.7)
Eosinophils Relative: 2 % (ref 0.0–5.0)
HCT: 37.7 % (ref 36.0–46.0)
Hemoglobin: 12.6 g/dL (ref 12.0–15.0)
Lymphocytes Relative: 56.3 % — ABNORMAL HIGH (ref 12.0–46.0)
Lymphs Abs: 6.5 10*3/uL — ABNORMAL HIGH (ref 0.7–4.0)
MCHC: 33.4 g/dL (ref 30.0–36.0)
MCV: 100.6 fl — ABNORMAL HIGH (ref 78.0–100.0)
Monocytes Absolute: 0.6 10*3/uL (ref 0.1–1.0)
Monocytes Relative: 5.3 % (ref 3.0–12.0)
Neutro Abs: 4.1 10*3/uL (ref 1.4–7.7)
Neutrophils Relative %: 35.8 % — ABNORMAL LOW (ref 43.0–77.0)
Platelets: 226 10*3/uL (ref 150.0–400.0)
RBC: 3.75 Mil/uL — ABNORMAL LOW (ref 3.87–5.11)
RDW: 13.5 % (ref 11.5–15.5)
WBC: 11.5 10*3/uL — ABNORMAL HIGH (ref 4.0–10.5)

## 2018-05-03 DIAGNOSIS — D692 Other nonthrombocytopenic purpura: Secondary | ICD-10-CM | POA: Diagnosis not present

## 2018-05-03 DIAGNOSIS — Z85828 Personal history of other malignant neoplasm of skin: Secondary | ICD-10-CM | POA: Diagnosis not present

## 2018-05-03 DIAGNOSIS — L821 Other seborrheic keratosis: Secondary | ICD-10-CM | POA: Diagnosis not present

## 2018-05-09 ENCOUNTER — Ambulatory Visit (INDEPENDENT_AMBULATORY_CARE_PROVIDER_SITE_OTHER): Payer: Medicare Other

## 2018-05-09 VITALS — BP 146/70 | HR 56 | Temp 98.1°F | Resp 16 | Ht 61.5 in | Wt 124.0 lb

## 2018-05-09 DIAGNOSIS — Z0000 Encounter for general adult medical examination without abnormal findings: Secondary | ICD-10-CM | POA: Diagnosis not present

## 2018-05-09 NOTE — Progress Notes (Signed)
Subjective:   Rebecca [name] is a 85 y.o. female who presents for Medicare Annual (Subsequent) preventive examination.  Review of Systems:  No ROS.  Medicare Wellness Visit. Additional risk factors are reflected in the social history.  Cardiac Risk Factors include: advanced age (>70men, >60 women);hypertension;dyslipidemia Sleep patterns: no sleep issues, feels rested on waking and gets up 1 times nightly to void.    Home Safety/Smoke Alarms: Feels safe in home. Smoke alarms in place.  Living environment; residence and Firearm Safety: 1-story house/ trailer. No use or need for DME at this time.  Seat Belt Safety/Bike Helmet: Wears seat belt.   Female:   Pap- N/A as pt. Over 31yo       Mammo- pt. Stated 08/2017  Dexa scan- 04/2015, but pt. thinks she received one since then, also gets from [place]- 12/2014, will follow-up with Dr. Fuller Plan regarding need to return  Med record request to be faxed to Dr. Jerrilyn [place] office for relevant above records.     Objective:     Vitals: BP (!) 146/70 (BP Location: Right Arm, Patient Position: Sitting, Cuff Size: Normal)   Pulse (!) 56   Temp 98.1 F (36.7 C)   Resp 16   Ht 5' 1.5" (1.562 m)   Wt 124 lb (56.2 kg)   SpO2 96%   BMI 23.05 kg/m   Body mass index is 23.05 kg/m.  Advanced Directives 05/09/2018 02/08/2017 02/23/2016  Does Patient Have a Medical Advance Directive? Yes Yes Yes  Type of Paramedic of [place];Living will - [place];Living will  Copy of [place] in Chart? No - copy requested - No - copy requested    Tobacco Social History   Tobacco Use  Smoking Status Never Smoker  Smokeless Tobacco Never Used     Counseling given: Not Answered   Past Medical History:  Diagnosis Date  . Anemia   . Anxiety   . Diverticulitis   . Diverticulosis   . Gastroparesis   . GERD (gastroesophageal reflux disease)   . Glaucoma   . Hemorrhoids   .  Hyperlipidemia   . Hypertension   . Hypertension   . IBS (irritable bowel syndrome)   . Rectal ulcer   . 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 Tubular adenoma of colon 05/2009   Past Surgical History:  Procedure Laterality Date  . APPENDECTOMY    . ARCUATE KERATECTOMY    . CATARACT EXTRACTION, BILATERAL  2015  . double pallital tori bone removed     . HERNIA REPAIR     umbilical  . KNEE SURGERY Bilateral   . morton's neuroma on rt foot and platar facial release    . MOUTH SURGERY     "bone shaved off roof of mouth"  . SALIVARY GLAND SURGERY     removal  . TONSILLECTOMY     Family History  Problem Relation Age of Onset  . Diabetes Mother   . Heart disease Mother   . Hyperlipidemia Mother   . Hypertension Mother   . Anuerysm Father   . Heart disease Father   . Hypertension Father   . Migraines Daughter   . Colon polyps Sister   . Lung cancer Sister   . Colon polyps Brother   . Diabetes Brother   . Hyperlipidemia Brother   . Hypertension Brother   . Colon polyps Daughter   . Breast cancer Daughter   . Colon cancer  Maternal Aunt        Rectal cancer  . Cancer Daughter        salivary  . Colon cancer Paternal Grandmother        with possible stomach cancer  . Heart disease Brother   . Crohn's disease Neg Hx   . Pancreatic cancer Neg Hx    Social History   Socioeconomic History  . Marital status: Widowed    Spouse name: Not on file  . Number of children: 3  . Years of education: Not on file  . Highest education level: Not on file  Occupational History  . 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 Financial resource strain: Not hard at all  . Food insecurity:    Worry: Never true    Inability: Never true  . Transportation needs:    Medical: No    Non-medical: No  Tobacco Use  . Smoking status: Never Smoker  . Smokeless tobacco: Never Used  Substance and Sexual Activity  . Alcohol use: Yes    Alcohol/week: 0.0 standard drinks    Comment:  rare  . Drug use: No  . Sexual activity: Yes  Lifestyle  . Physical activity:    Days per week: 0 days    Minutes per session: 0 min  . Stress: Only a little  Relationships  . Social connections:    Talks on phone: Twice a week    Gets together: Twice a week    Attends religious service: More than 4 times per year    Active member of club or organization: No    Attends meetings of clubs or organizations: Never    Relationship status: Widowed  Other Topics Concern  . 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 ALPRAZolam (XANAX) 0.25 MG tablet Take 1 tablet (0.25 mg total) by mouth at bedtime as needed.  . Calcium Carbonate-Vitamin D (CALCIUM-VITAMIN D) 500-200 MG-UNIT per tablet Take 1 tablet by mouth 2 (two) times daily with a meal.    . Cholecalciferol (VITAMIN D3) 2000 UNITS TABS Take 1 tablet by mouth daily.   . diclofenac sodium (VOLTAREN) 1 % GEL Apply 2 g topically 4 (four) times daily.  [name] Kitchen esomeprazole (NEXIUM) 20 MG capsule Take 20 mg by mouth 2 (two) times daily before a meal.  . gabapentin (NEURONTIN) 100 MG capsule Take 2 capsules (200 mg total) by mouth at bedtime. TAKE 2 CAPSULES BY MOUTH at bedtime  . losartan (COZAAR) 100 MG tablet TAKE 1 TABLET BY MOUTH ONCE DAILY  . metoCLOPramide (REGLAN) 5 MG tablet TAKE 1 TABLET BY MOUTH WITH MEALS TWICE A DAY  . simvastatin (ZOCOR) 20 MG tablet TAKE 1 TABLET BY MOUTH AT BEDTIME  . SYNTHROID 88 MCG tablet TAKE 1 TABLET BY MOUTH ONCE DAILY  . vitamin B-12 (CYANOCOBALAMIN) 500 MCG tablet Take 500 mcg by mouth every morning.   . vitamin C (ASCORBIC ACID) 500 MG tablet Take 500 mg by mouth  daily.  . vitamin E (VITAMIN E) 400 UNIT capsule Take  400 Units by mouth daily.  . Zoledronic Acid (RECLAST IV) Inject into the vein. Once per year.  . [DISCONTINUED] Albuterol Sulfate 108 (90 Base) MCG/ACT AEPB Inhale 4 puffs into the lungs daily.  . [DISCONTINUED] Artificial Tear Solution (GENTEAL TEARS) 0.1-0.2-0.3 % SOLN Place 2 drops into both eyes at bedtime.  . [DISCONTINUED] benzonatate (TESSALON) 200 MG capsule Take 1 capsule (200 mg total) by mouth 2 (two) times daily as needed for cough. (Patient not taking: Reported on 05/09/2018)  . [DISCONTINUED] olmesartan (BENICAR) 20 MG tablet Take 20 mg by mouth daily.     No facility-administered encounter medications on file as of 05/09/2018.     Activities of Daily Living In your present state of health, do you have any difficulty performing the following activities: 05/09/2018 02/13/2018  Hearing? Y N  Comment followed by audiology -  Vision? N N  Difficulty concentrating or making decisions? N N  Walking or climbing stairs? N N  Dressing or bathing? N N  Doing errands, shopping? N N  Preparing Food and eating ? N -  Using the Toilet? N -  In the past six months, have you accidently leaked urine? N -  Do you have problems with loss of bowel control? N -  Managing your Medications? N -  Managing your Finances? N -  Housekeeping or managing your Housekeeping? N -  Some recent data might be hidden    Patient Care Team: [name], MD as PCP - General (Family Medicine) Ladene Artist, MD as Consulting Physician (Gastroenterology) Aloha Gell, MD as Consulting Physician (Obstetrics and Gynecology) Luberta Mutter, MD as Consulting Physician (Ophthalmology)    Assessment:   This is a routine wellness examination for Aarian. Physical assessment deferred to PCP.   Exercise Activities and Dietary recommendations Current Exercise Habits: Home exercise routine, Type of exercise: walking, Time (Minutes): 30, Frequency  (Times/Week): 5, Weekly Exercise (Minutes/Week): 150, Intensity: Mild, Exercise limited by: cardiac condition(s)  Diet (meal preparation, eat out, water intake, caffeinated beverages, dairy products, fruits and vegetables): in general, a "healthy" diet  . Addressed hx high cholesterol and triglycerides, but was not interested in any diet education resources at this time. Noted some weight loss, and mentioned trying protein shakes like ensure, but pt. States she does not care for those, does have Boost sometimes. States she thinks she drinks enough water during the day (4 glasses), although author encouraged more.       Goals    . patient     Will continue walking; try to build up to 30 minutes 5 days a week     . Patient Stated     Maintain level of function, staying independent       Fall Risk Fall Risk  05/09/2018 02/19/2018 02/13/2018 02/08/2017 01/01/2016  Falls in the past year? 0 0 0 No Yes  Comment - Emmi Telephone Survey: data to providers prior to load - - -  Number falls in past yr: - - - - 1  Comment - - - - -  Injury with Fall? - - - - -  Risk for fall due to : - - - - -  Follow up - - - - Education provided    Depression Screen PHQ 2/9 Scores 05/09/2018 02/13/2018 02/08/2017 01/01/2016  PHQ - 2 Score 0 0 0 0  PHQ- 9 Score 0 - - -     Cognitive Function MMSE - Mini Mental State Exam 02/08/2017  Not completed: (No  Data)       Ad8 score reviewed for issues:  Issues making decisions: no  Less interest in hobbies / activities: no  Repeats questions, stories (family complaining): no  Trouble using ordinary gadgets (microwave, computer, phone):no  Forgets the month or year: no  Mismanaging finances: no  Remembering appts: no  Daily problems with thinking and/or memory: no Ad8 score is= 0  Immunization History  Administered Date(s) Administered  . H1N1 06/02/2008  . Influenza Split 12/29/2010  . Influenza Whole 04/04/2001, 02/01/2007, 12/24/2007, 12/16/2008,  01/15/2009, 01/05/2010  . Influenza, High Dose Seasonal PF 12/31/2014, 01/01/2016, 01/11/2017, 01/02/2018  . Influenza,inj,Quad PF,6+ Mos 12/19/2012, 12/25/2013  . Pneumococcal Polysaccharide-23 03/05/1999  . Td 04/05/1999, 07/28/2008  . Zoster 10/24/2007  . Zoster Recombinat (Shingrix) 08/04/2016, 10/12/2016    Qualifies for Shingles Vaccine? Shingrix series completed 2018.   Screening Tests Health Maintenance  Topic Date Due  . TETANUS/TDAP  07/29/2018  . INFLUENZA VACCINE  Completed  . DEXA SCAN  Completed  . MAMMOGRAM  Discontinued       Plan:     Bring a copy of your living will and/or healthcare power of attorney to your next office visit.   I have personally reviewed and noted the following in the patient's chart:   . Medical and social history . Use of alcohol, tobacco or illicit drugs  . Current medications and supplements . Functional ability and status . Nutritional status . Physical activity . Advanced directives . List of other physicians . Vitals . Screenings to include cognitive, depression, and falls . Referrals and appointments  In addition, I have reviewed and discussed with patient certain preventive protocols, quality metrics, and best practice recommendations. A written personalized care plan for preventive services as well as general preventive health recommendations were provided to patient.     Alphia Moh, RN  05/09/2018

## 2018-05-09 NOTE — Patient Instructions (Addendum)
Bring a copy of your living will and/or healthcare power of attorney to your next office visit.    Ms. Brocato , Thank you for taking time to come for your Medicare Wellness Visit. I appreciate your ongoing commitment to your health goals. Please review the following plan we discussed and let me know if I can assist you in the future.   These are the goals we discussed: Goals    . patient     Will continue walking; try to build up to 30 minutes 5 days a week     . Patient Stated     Maintain level of function, staying independent       This is a list of the screening recommended for you and due dates:  Health Maintenance  Topic Date Due  . Tetanus Vaccine  07/29/2018  . Flu Shot  Completed  . DEXA scan (bone density measurement)  Completed  . Mammogram  Discontinued    Health Maintenance, Female Adopting a healthy lifestyle and getting preventive care can go a long way to promote health and wellness. Talk with your health care provider about what schedule of regular examinations is right for you. This is a good chance for you to check in with your provider about disease prevention and staying healthy. In between checkups, there are plenty of things you can do on your own. Experts have done a lot of research about which lifestyle changes and preventive measures are most likely to keep you healthy. Ask your health care provider for more information. Weight and diet Eat a healthy diet  Be sure to include plenty of vegetables, fruits, low-fat dairy products, and lean protein.  Do not eat a lot of foods high in solid fats, added sugars, or salt.  Get regular exercise. This is one of the most important things you can do for your health. ? Most adults should exercise for at least 150 minutes each week. The exercise should increase your heart rate and make you sweat (moderate-intensity exercise). ? Most adults should also do strengthening exercises at least twice a week. This is in  addition to the moderate-intensity exercise. Maintain a healthy weight  Body mass index (BMI) is a measurement that can be used to identify possible weight problems. It estimates body fat based on height and weight. Your health care provider can help determine your BMI and help you achieve or maintain a healthy weight.  For females 83 years of age and older: ? A BMI below 18.5 is considered underweight. ? A BMI of 18.5 to 24.9 is normal. ? A BMI of 25 to 29.9 is considered overweight. ? A BMI of 30 and above is considered obese. Watch levels of cholesterol and blood lipids  You should start having your blood tested for lipids and cholesterol at 83 years of age, then have this test every 5 years.  You may need to have your cholesterol levels checked more often if: ? Your lipid or cholesterol levels are high. ? You are older than 83 years of age. ? You are at high risk for heart disease. Cancer screening Lung Cancer  Lung cancer screening is recommended for adults 53-69 years old who are at high risk for lung cancer because of a history of smoking.  A yearly low-dose CT scan of the lungs is recommended for people who: ? Currently smoke. ? Have quit within the past 15 years. ? Have at least a 30-pack-year history of smoking. A pack year is  smoking an average of one pack of cigarettes a day for 1 year.  Yearly screening should continue until it has been 15 years since you quit.  Yearly screening should stop if you develop a health problem that would prevent you from having lung cancer treatment. Breast Cancer  Practice breast self-awareness. This means understanding how your breasts normally appear and feel.  It also means doing regular breast self-exams. Let your health care provider know about any changes, no matter how small.  If you are in your 20s or 30s, you should have a clinical breast exam (CBE) by a health care provider every 1-3 years as part of a regular health  exam.  If you are 55 or older, have a CBE every year. Also consider having a breast X-ray (mammogram) every year.  If you have a family history of breast cancer, talk to your health care provider about genetic screening.  If you are at high risk for breast cancer, talk to your health care provider about having an MRI and a mammogram every year.  Breast cancer gene (BRCA) assessment is recommended for women who have family members with BRCA-related cancers. BRCA-related cancers include: ? Breast. ? Ovarian. ? Tubal. ? Peritoneal cancers.  Results of the assessment will determine the need for genetic counseling and BRCA1 and BRCA2 testing. Cervical Cancer Your health care provider may recommend that you be screened regularly for cancer of the pelvic organs (ovaries, uterus, and vagina). This screening involves a pelvic examination, including checking for microscopic changes to the surface of your cervix (Pap test). You may be encouraged to have this screening done every 3 years, beginning at age 33.  For women ages 68-65, health care providers may recommend pelvic exams and Pap testing every 3 years, or they may recommend the Pap and pelvic exam, combined with testing for human papilloma virus (HPV), every 5 years. Some types of HPV increase your risk of cervical cancer. Testing for HPV may also be done on women of any age with unclear Pap test results.  Other health care providers may not recommend any screening for nonpregnant women who are considered low risk for pelvic cancer and who do not have symptoms. Ask your health care provider if a screening pelvic exam is right for you.  If you have had past treatment for cervical cancer or a condition that could lead to cancer, you need Pap tests and screening for cancer for at least 20 years after your treatment. If Pap tests have been discontinued, your risk factors (such as having a new sexual partner) need to be reassessed to determine if  screening should resume. Some women have medical problems that increase the chance of getting cervical cancer. In these cases, your health care provider may recommend more frequent screening and Pap tests. Colorectal Cancer  This type of cancer can be detected and often prevented.  Routine colorectal cancer screening usually begins at 83 years of age and continues through 83 years of age.  Your health care provider may recommend screening at an earlier age if you have risk factors for colon cancer.  Your health care provider may also recommend using home test kits to check for hidden blood in the stool.  A small camera at the end of a tube can be used to examine your colon directly (sigmoidoscopy or colonoscopy). This is done to check for the earliest forms of colorectal cancer.  Routine screening usually begins at age 6.  Direct examination of the colon  should be repeated every 5-10 years through 83 years of age. However, you may need to be screened more often if early forms of precancerous polyps or small growths are found. Skin Cancer  Check your skin from head to toe regularly.  Tell your health care provider about any new moles or changes in moles, especially if there is a change in a mole's shape or color.  Also tell your health care provider if you have a mole that is larger than the size of a pencil eraser.  Always use sunscreen. Apply sunscreen liberally and repeatedly throughout the day.  Protect yourself by wearing long sleeves, pants, a wide-brimmed hat, and sunglasses whenever you are outside. Heart disease, diabetes, and high blood pressure  High blood pressure causes heart disease and increases the risk of stroke. High blood pressure is more likely to develop in: ? People who have blood pressure in the high end of the normal range (130-139/85-89 mm Hg). ? People who are overweight or obese. ? People who are African American.  If you are 4-11 years of age, have your  blood pressure checked every 3-5 years. If you are 83 years of age or older, have your blood pressure checked every year. You should have your blood pressure measured twice-once when you are at a hospital or clinic, and once when you are not at a hospital or clinic. Record the average of the two measurements. To check your blood pressure when you are not at a hospital or clinic, you can use: ? An automated blood pressure machine at a pharmacy. ? A home blood pressure monitor.  If you are between 69 years and 11 years old, ask your health care provider if you should take aspirin to prevent strokes.  Have regular diabetes screenings. This involves taking a blood sample to check your fasting blood sugar level. ? If you are at a normal weight and have a low risk for diabetes, have this test once every three years after 83 years of age. ? If you are overweight and have a high risk for diabetes, consider being tested at a younger age or more often. Preventing infection Hepatitis B  If you have a higher risk for hepatitis B, you should be screened for this virus. You are considered at high risk for hepatitis B if: ? You were born in a country where hepatitis B is common. Ask your health care provider which countries are considered high risk. ? Your parents were born in a high-risk country, and you have not been immunized against hepatitis B (hepatitis B vaccine). ? You have HIV or AIDS. ? You use needles to inject street drugs. ? You live with someone who has hepatitis B. ? You have had sex with someone who has hepatitis B. ? You get hemodialysis treatment. ? You take certain medicines for conditions, including cancer, organ transplantation, and autoimmune conditions. Hepatitis C  Blood testing is recommended for: ? Everyone born from [birth year] through 1963. ? Anyone with known risk factors for hepatitis C. Sexually transmitted infections (STIs)  You should be screened for sexually transmitted  infections (STIs) including gonorrhea and chlamydia if: ? You are sexually active and are younger than 83 years of age. ? You are older than 83 years of age and your health care provider tells you that you are at risk for this type of infection. ? Your sexual activity has changed since you were last screened and you are at an increased risk for chlamydia or  gonorrhea. Ask your health care provider if you are at risk.  If you do not have HIV, but are at risk, it may be recommended that you take a prescription medicine daily to prevent HIV infection. This is called pre-exposure prophylaxis (PrEP). You are considered at risk if: ? You are sexually active and do not regularly use condoms or know the HIV status of your partner(s). ? You take drugs by injection. ? You are sexually active with a partner who has HIV. Talk with your health care provider about whether you are at high risk of being infected with HIV. If you choose to begin PrEP, you should first be tested for HIV. You should then be tested every 3 months for as long as you are taking PrEP. Pregnancy  If you are premenopausal and you may become pregnant, ask your health care provider about preconception counseling.  If you may become pregnant, take 400 to 800 micrograms (mcg) of folic acid every day.  If you want to prevent pregnancy, talk to your health care provider about birth control (contraception). Osteoporosis and menopause  Osteoporosis is a disease in which the bones lose minerals and strength with aging. This can result in serious bone fractures. Your risk for osteoporosis can be identified using a bone density scan.  If you are 55 years of age or older, or if you are at risk for osteoporosis and fractures, ask your health care provider if you should be screened.  Ask your health care provider whether you should take a calcium or vitamin D supplement to lower your risk for osteoporosis.  Menopause may have certain physical  symptoms and risks.  Hormone replacement therapy may reduce some of these symptoms and risks. Talk to your health care provider about whether hormone replacement therapy is right for you. Follow these instructions at home:  Schedule regular health, dental, and eye exams.  Stay current with your immunizations.  Do not use any tobacco products including cigarettes, chewing tobacco, or electronic cigarettes.  If you are pregnant, do not drink alcohol.  If you are breastfeeding, limit how much and how often you drink alcohol.  Limit alcohol intake to no more than 1 drink per day for nonpregnant women. One drink equals 12 ounces of beer, 5 ounces of wine, or 1 ounces of hard liquor.  Do not use street drugs.  Do not share needles.  Ask your health care provider for help if you need support or information about quitting drugs.  Tell your health care provider if you often feel depressed.  Tell your health care provider if you have ever been abused or do not feel safe at home. This information is not intended to replace advice given to you by your health care provider. Make sure you discuss any questions you have with your health care provider. Document Released: 10/04/2010 Document Revised: 08/27/2015 Document Reviewed: 12/23/2014 Elsevier Interactive Patient Education  2019 [employer].

## 2018-06-11 ENCOUNTER — Other Ambulatory Visit: Payer: Self-pay | Admitting: Family Medicine

## 2018-06-14 ENCOUNTER — Other Ambulatory Visit: Payer: Self-pay | Admitting: Family Medicine

## 2018-06-14 NOTE — Telephone Encounter (Signed)
Last rx given on 04/27/18 for #20 with no ref

## 2018-06-17 NOTE — Telephone Encounter (Signed)
Refill once.   We have discussed with her trying to taper completely off.

## 2018-06-18 NOTE — Telephone Encounter (Signed)
Rx done.  I left a detailed message at the pts home number with the information below.

## 2018-06-26 DIAGNOSIS — N816 Rectocele: Secondary | ICD-10-CM | POA: Diagnosis not present

## 2018-06-26 DIAGNOSIS — B373 Candidiasis of vulva and vagina: Secondary | ICD-10-CM | POA: Diagnosis not present

## 2018-07-16 ENCOUNTER — Other Ambulatory Visit: Payer: Self-pay | Admitting: Family Medicine

## 2018-07-31 ENCOUNTER — Other Ambulatory Visit: Payer: Self-pay | Admitting: Family Medicine

## 2018-09-04 DIAGNOSIS — Z961 Presence of intraocular lens: Secondary | ICD-10-CM | POA: Diagnosis not present

## 2018-09-04 DIAGNOSIS — H401132 Primary open-angle glaucoma, bilateral, moderate stage: Secondary | ICD-10-CM | POA: Diagnosis not present

## 2018-09-04 DIAGNOSIS — H52203 Unspecified astigmatism, bilateral: Secondary | ICD-10-CM | POA: Diagnosis not present

## 2018-09-24 ENCOUNTER — Other Ambulatory Visit: Payer: Self-pay | Admitting: Family Medicine

## 2018-09-25 DIAGNOSIS — L821 Other seborrheic keratosis: Secondary | ICD-10-CM | POA: Diagnosis not present

## 2018-09-25 DIAGNOSIS — L218 Other seborrheic dermatitis: Secondary | ICD-10-CM | POA: Diagnosis not present

## 2018-09-25 DIAGNOSIS — D1801 Hemangioma of skin and subcutaneous tissue: Secondary | ICD-10-CM | POA: Diagnosis not present

## 2018-09-25 DIAGNOSIS — Z85828 Personal history of other malignant neoplasm of skin: Secondary | ICD-10-CM | POA: Diagnosis not present

## 2018-09-25 DIAGNOSIS — L570 Actinic keratosis: Secondary | ICD-10-CM | POA: Diagnosis not present

## 2018-10-03 ENCOUNTER — Encounter: Payer: Self-pay | Admitting: Family Medicine

## 2018-10-03 MED ORDER — ALPRAZOLAM 0.25 MG PO TABS: 0.2500 mg | ORAL_TABLET | Freq: Every evening | ORAL | 1 refills | Status: DC | PRN

## 2018-10-03 NOTE — Telephone Encounter (Signed)
We can refill- and I did.   We just recommend against regular use.  Would have her come in for tetanus shot.

## 2018-10-17 ENCOUNTER — Other Ambulatory Visit: Payer: Self-pay | Admitting: Family Medicine

## 2018-10-26 DIAGNOSIS — Z1231 Encounter for screening mammogram for malignant neoplasm of breast: Secondary | ICD-10-CM | POA: Diagnosis not present

## 2018-10-26 DIAGNOSIS — Z803 Family history of malignant neoplasm of breast: Secondary | ICD-10-CM | POA: Diagnosis not present

## 2018-10-29 ENCOUNTER — Ambulatory Visit: Payer: Self-pay | Admitting: *Deleted

## 2018-10-29 NOTE — Telephone Encounter (Signed)
My son was exposed to the  COVID-19 virus.   They  went to a birthday party outside Saturday night.  I did not go to the party.  3 members of the family that  went to the party visited with me Monday.    We were outside.   I had my mask on.  They did not have masks on.     Should I be tested or wait to see what the 3 family member's test results are.   They were tested Friday.  I let her know she could go either way.   She denies having symptoms or having direct exposure to the positive person.  She could wait and see what the family members that got tested on Friday what their test results were or she could go ahead and be tested.   I let her know where the testing site for [facility] was at the [facility] on [address].   She was familiar with the location and that she could go without an appt or seeing her doctor first.    She has decided to wait and see if the other's test results come back ok.  She thanked me for answering her questions.

## 2018-11-01 DIAGNOSIS — N760 Acute vaginitis: Secondary | ICD-10-CM | POA: Diagnosis not present

## 2018-11-01 DIAGNOSIS — Z124 Encounter for screening for malignant neoplasm of cervix: Secondary | ICD-10-CM | POA: Diagnosis not present

## 2018-11-01 DIAGNOSIS — M810 Age-related osteoporosis without current pathological fracture: Secondary | ICD-10-CM | POA: Diagnosis not present

## 2018-11-01 DIAGNOSIS — N952 Postmenopausal atrophic vaginitis: Secondary | ICD-10-CM | POA: Diagnosis not present

## 2018-11-01 DIAGNOSIS — L900 Lichen sclerosus et atrophicus: Secondary | ICD-10-CM | POA: Diagnosis not present

## 2018-11-12 ENCOUNTER — Other Ambulatory Visit: Payer: Self-pay | Admitting: Family Medicine

## 2018-11-27 DIAGNOSIS — S00411A Abrasion of right ear, initial encounter: Secondary | ICD-10-CM | POA: Diagnosis not present

## 2018-11-27 DIAGNOSIS — H903 Sensorineural hearing loss, bilateral: Secondary | ICD-10-CM | POA: Diagnosis not present

## 2018-11-27 DIAGNOSIS — J343 Hypertrophy of nasal turbinates: Secondary | ICD-10-CM | POA: Diagnosis not present

## 2018-11-27 DIAGNOSIS — H6121 Impacted cerumen, right ear: Secondary | ICD-10-CM | POA: Diagnosis not present

## 2018-11-27 DIAGNOSIS — Z974 Presence of external hearing-aid: Secondary | ICD-10-CM | POA: Diagnosis not present

## 2018-11-27 DIAGNOSIS — Z9089 Acquired absence of other organs: Secondary | ICD-10-CM | POA: Diagnosis not present

## 2018-12-28 ENCOUNTER — Other Ambulatory Visit: Payer: Self-pay | Admitting: Family Medicine

## 2018-12-31 ENCOUNTER — Ambulatory Visit (INDEPENDENT_AMBULATORY_CARE_PROVIDER_SITE_OTHER): Payer: Medicare Other | Admitting: *Deleted

## 2018-12-31 ENCOUNTER — Other Ambulatory Visit: Payer: Self-pay

## 2018-12-31 DIAGNOSIS — Z23 Encounter for immunization: Secondary | ICD-10-CM | POA: Diagnosis not present

## 2019-01-01 NOTE — Telephone Encounter (Signed)
Pt calling to check on the status of this.  States that she is completely out.

## 2019-01-02 DIAGNOSIS — R030 Elevated blood-pressure reading, without diagnosis of hypertension: Secondary | ICD-10-CM | POA: Diagnosis not present

## 2019-01-02 DIAGNOSIS — M4726 Other spondylosis with radiculopathy, lumbar region: Secondary | ICD-10-CM | POA: Diagnosis not present

## 2019-01-02 NOTE — Telephone Encounter (Signed)
Pt calling to check status. Please advise  °

## 2019-01-03 NOTE — Telephone Encounter (Signed)
Pt calling to find out why this medication isn't being refilled.

## 2019-01-03 NOTE — Telephone Encounter (Signed)
This refill request is 6 days out.

## 2019-01-03 NOTE — Telephone Encounter (Signed)
Okay to refill? 

## 2019-01-04 ENCOUNTER — Encounter: Payer: Self-pay | Admitting: Family Medicine

## 2019-01-04 MED ORDER — METOCLOPRAMIDE HCL 5 MG PO TABS: ORAL_TABLET | ORAL | 2 refills | Status: DC

## 2019-01-04 NOTE — Telephone Encounter (Signed)
May refill once. Don't know if she is taking prn.  Looks like based on refills not taking regularly.  Recommend Doxy follow up before further refills to clarify.

## 2019-01-07 ENCOUNTER — Other Ambulatory Visit: Payer: Self-pay | Admitting: *Deleted

## 2019-01-07 DIAGNOSIS — K2100 Gastro-esophageal reflux disease with esophagitis, without bleeding: Secondary | ICD-10-CM

## 2019-01-07 MED ORDER — METOCLOPRAMIDE HCL 5 MG PO TABS: ORAL_TABLET | ORAL | 0 refills | Status: DC

## 2019-01-10 DIAGNOSIS — M4186 Other forms of scoliosis, lumbar region: Secondary | ICD-10-CM | POA: Diagnosis not present

## 2019-01-10 DIAGNOSIS — M5137 Other intervertebral disc degeneration, lumbosacral region: Secondary | ICD-10-CM | POA: Diagnosis not present

## 2019-01-10 DIAGNOSIS — M5136 Other intervertebral disc degeneration, lumbar region: Secondary | ICD-10-CM | POA: Diagnosis not present

## 2019-01-15 DIAGNOSIS — N816 Rectocele: Secondary | ICD-10-CM | POA: Diagnosis not present

## 2019-02-15 ENCOUNTER — Encounter: Payer: Self-pay | Admitting: Family Medicine

## 2019-02-15 ENCOUNTER — Ambulatory Visit (INDEPENDENT_AMBULATORY_CARE_PROVIDER_SITE_OTHER): Payer: Medicare Other | Admitting: Family Medicine

## 2019-02-15 ENCOUNTER — Other Ambulatory Visit: Payer: Self-pay

## 2019-02-15 VITALS — BP 112/66 | HR 50 | Temp 98.3°F | Resp 16 | Ht 61.5 in | Wt 125.7 lb

## 2019-02-15 DIAGNOSIS — E038 Other specified hypothyroidism: Secondary | ICD-10-CM

## 2019-02-15 DIAGNOSIS — K2100 Gastro-esophageal reflux disease with esophagitis, without bleeding: Secondary | ICD-10-CM | POA: Diagnosis not present

## 2019-02-15 DIAGNOSIS — E785 Hyperlipidemia, unspecified: Secondary | ICD-10-CM | POA: Diagnosis not present

## 2019-02-15 DIAGNOSIS — I10 Essential (primary) hypertension: Secondary | ICD-10-CM | POA: Diagnosis not present

## 2019-02-15 LAB — BASIC METABOLIC PANEL
BUN: 11 mg/dL (ref 6–23)
CO2: 30 mEq/L (ref 19–32)
Calcium: 9.7 mg/dL (ref 8.4–10.5)
Chloride: 97 mEq/L (ref 96–112)
Creatinine, Ser: 0.9 mg/dL (ref 0.40–1.20)
GFR: 59.65 mL/min — ABNORMAL LOW (ref >60.00)
Glucose, Bld: 99 mg/dL (ref 70–99)
Potassium: 4.6 mEq/L (ref 3.5–5.1)
Sodium: 132 mEq/L — ABNORMAL LOW (ref 135–145)

## 2019-02-15 LAB — LIPID PANEL
Cholesterol: 189 mg/dL (ref 0–200)
HDL: 55.9 mg/dL (ref >39.00)
LDL Cholesterol: 106 mg/dL — ABNORMAL HIGH (ref 0–99)
NonHDL: 133.28
Total CHOL/HDL Ratio: 3
Triglycerides: 137 mg/dL (ref 0.0–149.0)
VLDL: 27.4 mg/dL (ref 0.0–40.0)

## 2019-02-15 LAB — HEPATIC FUNCTION PANEL
ALT: 22 U/L (ref 0–35)
AST: 19 U/L (ref 0–37)
Albumin: 4.1 g/dL (ref 3.5–5.2)
Alkaline Phosphatase: 59 U/L (ref 39–117)
Bilirubin, Direct: 0.1 mg/dL (ref 0.0–0.3)
Total Bilirubin: 0.8 mg/dL (ref 0.2–1.2)
Total Protein: 6 g/dL (ref 6.0–8.3)

## 2019-02-15 LAB — TSH: TSH: 2.6 u[IU]/mL (ref 0.35–4.50)

## 2019-02-15 MED ORDER — LOSARTAN POTASSIUM 100 MG PO TABS: 100.0000 mg | ORAL_TABLET | Freq: Every day | ORAL | 3 refills | Status: DC

## 2019-02-15 MED ORDER — SIMVASTATIN 20 MG PO TABS: 20.0000 mg | ORAL_TABLET | Freq: Every day | ORAL | 3 refills | Status: DC

## 2019-02-15 NOTE — Progress Notes (Signed)
Subjective:     Patient ID: Rebecca [name], female   DOB: 1932/08/03, 85 y.o.   MRN: [id number]  HPI Rebecca [name] is seen for medical follow-up.  She has chronic problems including hypertension, hyperlipidemia, hypothyroidism, GERD.  She is on twice daily PPI with Nexium and supplements with Pepcid and symptoms are fairly well-controlled with that.  Her blood pressures been stable.  No recent dizziness.  She takes simvastatin for hyperlipidemia.  Her only complaint is cost with Synthroid though her levels been normal for many years and she is reluctant to switch to generic.  No recent falls.  Denies any significant depression issues.  Compliant with all medications.  Flu vaccine already given.  Past Medical History:  Diagnosis Date  . Anemia   . Anxiety   . Diverticulitis   . Diverticulosis   . Gastroparesis   . GERD (gastroesophageal reflux disease)   . Glaucoma   . Hemorrhoids   . Hyperlipidemia   . Hypertension   . Hypertension   . IBS (irritable bowel syndrome)   . Rectal ulcer   . 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 Tubular adenoma of colon 05/2009   Past Surgical History:  Procedure Laterality Date  . APPENDECTOMY    . ARCUATE KERATECTOMY    . CATARACT EXTRACTION, BILATERAL  2015  . double pallital tori bone removed     . HERNIA REPAIR     umbilical  . KNEE SURGERY Bilateral   . morton's neuroma on rt foot and platar facial release    . MOUTH SURGERY     "bone shaved off roof of mouth"  . SALIVARY GLAND SURGERY     removal  . TONSILLECTOMY      reports that she has never smoked. She has never used smokeless tobacco. She reports current alcohol use. She reports that she does not use drugs. family history includes Anuerysm in her father; Breast cancer in her daughter; Cancer in her daughter; Colon cancer in her maternal aunt and paternal grandmother; Colon polyps in her brother, daughter, and sister; Diabetes in her brother and mother; Heart disease in her brother, father,  and mother; Hyperlipidemia in her brother and mother; Hypertension in her brother, father, and mother; Lung cancer in her sister; Migraines in her daughter. Allergies  Allergen Reactions  . Levaquin [Levofloxacin] Other (See Comments)    Very nauseous and shakey  . Sulfamethoxazole-Trimethoprim Nausea Only    shakey  . Lactose Intolerance (Gi) Diarrhea and Other (See Comments)    Stomach hurts   . Tramadol Hcl Nausea Only and Palpitations  . Tramadol Hcl Nausea Only and Palpitations  . Augmentin [Amoxicillin-Pot Clavulanate] Diarrhea  . Ciprofloxacin Nausea Only    REACTION: Thrush, Nausea, Shakey  . Pneumococcal Vaccine Polyvalent     REACTION: RED AND RAISED RASH ON ARM  . Adhesive [Tape] Rash  . Codeine Nausea Only     Review of Systems  Constitutional: Negative for chills, fatigue, fever and unexpected weight change.  HENT: Negative for trouble swallowing.   Eyes: Negative for visual disturbance.  Respiratory: Negative for cough, chest tightness, shortness of breath and wheezing.   Cardiovascular: Negative for chest pain, palpitations and leg swelling.  Genitourinary: Negative for dysuria.  Musculoskeletal: Positive for arthralgias.  Neurological: Negative for dizziness, seizures, syncope, weakness, light-headedness and headaches.       Objective:   Physical Exam Constitutional:      Appearance: She is well-developed.  HENT:     Head: Normocephalic and atraumatic.  Eyes:     Pupils: Pupils are equal, round, and reactive to light.  Neck:     Musculoskeletal: Normal range of motion and neck supple.     Thyroid: No thyromegaly.  Cardiovascular:     Rate and Rhythm: Normal rate and regular rhythm.     Heart sounds: Normal heart sounds. No murmur.  Pulmonary:     Effort: No respiratory distress.     Breath sounds: Normal breath sounds. No wheezing or rales.  Abdominal:     General: Bowel sounds are normal. There is no distension.     Palpations: Abdomen is soft.  There is no mass.     Tenderness: There is no abdominal tenderness. There is no guarding or rebound.  Musculoskeletal: Normal range of motion.     Comments: She has significant deformities of the hands consistent with osteoarthritis with several nodules DIP and PIP joints  Lymphadenopathy:     Cervical: No cervical adenopathy.  Skin:    Findings: No rash.  Neurological:     Mental Status: She is alert and oriented to person, place, and time.     Cranial Nerves: No cranial nerve deficit.     Deep Tendon Reflexes: Reflexes normal.  Psychiatric:        Behavior: Behavior normal.        Thought Content: Thought content normal.        Judgment: Judgment normal.        Assessment:     #1 hypertension stable and at goal  #2 hyperlipidemia treated with simvastatin  #3 hypothyroidism  #4 GERD controlled with PPI use    Plan:     -Check labs with lipid panel, hepatic panel, basic metabolic panel, TSH -Discussed possible generic options for her thyroid medication but she is reluctant at this time. -Recommend routine medical follow-up in 6 months and sooner as needed -Refill losartan and simvastatin for one year.  [name] MD [facility]

## 2019-02-16 ENCOUNTER — Encounter: Payer: Self-pay | Admitting: Family Medicine

## 2019-02-24 ENCOUNTER — Other Ambulatory Visit: Payer: Self-pay | Admitting: Family Medicine

## 2019-03-08 ENCOUNTER — Telehealth (INDEPENDENT_AMBULATORY_CARE_PROVIDER_SITE_OTHER): Payer: Medicare Other | Admitting: Family Medicine

## 2019-03-08 ENCOUNTER — Ambulatory Visit: Payer: Self-pay | Admitting: *Deleted

## 2019-03-08 DIAGNOSIS — Z7189 Other specified counseling: Secondary | ICD-10-CM

## 2019-03-08 DIAGNOSIS — J029 Acute pharyngitis, unspecified: Secondary | ICD-10-CM

## 2019-03-08 NOTE — Progress Notes (Signed)
Virtual Visit via Telephone Note  I connected with Rebecca [name] on 03/08/19 at  2:00 PM EST by telephone and verified that I am speaking with the correct person using two identifiers.   I discussed the limitations, risks, security and privacy concerns of performing an evaluation and management service by telephone and the availability of in person appointments. I also discussed with the patient that there may be a patient responsible charge related to this service. The patient expressed understanding and agreed to proceed.  Location patient: home Location provider: work or home office Participants present for the call: patient, provider Patient did not have a visit in the prior 7 days to address this/these issue(s).   History of Present Illness: Pt woke up yesterday with a "bad sore throat".  States her glands underneath her neck are swollen/sore to the touch.  She has taken ASA for her symptoms.  Pt expresses concern about her glands being swollen.  Pt unable to tell if there is any exudate in pharynx.  Pt denies HA, fever, rhinorrhea, inability to swallow, drooling, cough, n/v, diarrhea, sick contacts.     Observations/Objective: Patient sounds cheerful and well on the phone. I do not appreciate any SOB. Speech and thought processing are grossly intact. Patient reported vitals:  Assessment and Plan: Sore throat -discussed possible causes including viral illness, strep throat, abscess. -discussed supportive care: gaggling with warm salt water or chloraseptic spray. -pt advised to go to UC or ED after stating she had difficulty swallowing, but she declines. -pt given precautions for worsened or continued symptoms.  Educated about COVID-19 infection -discussed s/s of COVID -consider testing for new or worsened symptoms.  Follow Up Instructions: F/u prn   [id number] [id number] [id number] 21-30 I did not refer this patient for an OV in the next 24 hours for this/these  issue(s).  I discussed the assessment and treatment plan with the patient. The patient was provided an opportunity to ask questions and all were answered. The patient agreed with the plan and demonstrated an understanding of the instructions.   The patient was advised to call back or seek an in-person evaluation if the symptoms worsen or if the condition fails to improve as anticipated.  I provided 8:26 minutes of non-face-to-face time during this encounter.   Billie Ruddy, MD

## 2019-03-08 NOTE — Telephone Encounter (Signed)
Please call to schedule virtual visit.

## 2019-03-08 NOTE — Telephone Encounter (Signed)
Scheduled virtual for 03/08/2019

## 2019-03-08 NOTE — Telephone Encounter (Signed)
Pt states she woke up with a really bad sore throat on yesterday on the left side. Pt also noted that last night the gland on the the left side of neck was swollen. Painful with swallowing and to touch. No known exposure to strep or COVID-19. No fever or other symptoms. Attempted to contact [facility] x2  to schedule appt but no answer at this time. Pt notified that she would receive a return call to have appt scheduled. Pt verbalized understanding. Pt can be contacted at [phone]  Reason for Disposition . [1] HIGH RISK patient (e.g., age > 53 years, diabetes, heart or lung disease, weak immune system) AND [2] new or worsening symptoms  Answer Assessment - Initial Assessment Questions 1. ONSET: "When did the throat start hurting?" (Hours or days ago)      yesterday 2. SEVERITY: "How bad is the sore throat?" (Scale 1-10; mild, moderate or severe)   - MILD (1-3):  doesn't interfere with eating or normal activities   - MODERATE (4-7): interferes with eating some solids and normal activities   - SEVERE (8-10):  excruciating pain, interferes with most normal activities   - SEVERE DYSPHAGIA: can't swallow liquids, drooling    Between 6-7 3. STREP EXPOSURE: "Has there been any exposure to strep within the past week?" If so, ask: "What type of contact occurred?"      No 4.  VIRAL SYMPTOMS: "Are there any symptoms of a cold, such as a runny nose, cough, hoarse voice or red eyes?"      No but yesterday felt like I may be getting a head cold 5. FEVER: "Do you have a fever?" If so, ask: "What is your temperature, how was it measured, and when did it start?"     No 6. PUS ON THE TONSILS: "Is there pus on the tonsils in the back of your throat?"     No 7. OTHER SYMPTOMS: "Do you have any other symptoms?" (e.g., difficulty breathing, headache, rash)     No 8. PREGNANCY: "Is there any chance you are pregnant?" "When was your last menstrual period?"     n/a  Answer Assessment - Initial Assessment Questions 1.  COVID-19 CLOSE CONTACT: "Who is the person with the confirmed or suspected COVID-19 infection that you were exposed to?"     No 2. PLACE of CONTACT: "Where were you when you were exposed to COVID-19?" (e.g., home, school, medical waiting room; which city?)     No 3. TYPE of CONTACT: "How much contact was there?" (e.g., sitting next to, live in same house, work in same office, same building)     n/a 4. DURATION of CONTACT: "How long were you in contact with the COVID-19 patient?" (e.g., a few seconds, passed by person, a few minutes, 15 minutes or longer, live with the patient)     n/a 5. MASK: "Were you wearing a mask?" "Was the other person wearing a mask?" Note: wearing a mask reduces the risk of an  otherwise close contact.     Yes 6. DATEf CONTACT: "When did you have contact with a COVID-19 patient?" (e.g., how many days ago)     No none contact 7. COMMUNITY SPREAD: "Are there lots of cases of COVID-19 (community spread) where you live?" (See public health department website, if unsure)       yes 8. SYMPTOMS: "Do you have any symptoms?" (e.g., fever, cough, breathing difficulty, loss of taste or smell)     Sore throat 9. PREGNANCY OR  POSTPARTUM: "Is there any chance you are pregnant?" "When was your last menstrual period?" "Did you deliver in the last 2 weeks?"     n/a 10. HIGH RISK: "Do you have any heart or lung problems? Do you have a weak immune system?" (e.g., heart failure, COPD, asthma, HIV positive, chemotherapy, renal failure, diabetes mellitus, sickle cell anemia, obesity)       No 11.  TRAVEL: "Have you traveled out of the country recently?" If so, "When and where?"  Also ask about out-of-state travel, since the CDC has identified some high-risk cities for community spread in the [place].  Note: Travel becomes less relevant if there is widespread community transmission where the patient lives.       No  Answer Assessment - Initial Assessment Questions 1. COVID-19 DIAGNOSIS: "Who  made your Coronavirus (COVID-19) diagnosis?" "Was it confirmed by a positive lab test?" If not diagnosed by a HCP, ask "Are there lots of cases (community spread) where you live?" (See public health department website, if unsure)     n/a 2. COVID-19 EXPOSURE: "Was there any known exposure to [place] before the symptoms began?" CDC Definition of close contact: within 6 feet (2 meters) for a total of 15 minutes or more over a 24-hour period.      No 3. ONSET: "When did the COVID-19 symptoms start?"      yesterday 4. WORST SYMPTOM: "What is your worst symptom?" (e.g., cough, fever, shortness of breath, muscle aches)     Sore throat and swollen gland on the left side 5. COUGH: "Do you have a cough?" If so, ask: "How bad is the cough?"       no 6. FEVER: "Do you have a fever?" If so, ask: "What is your temperature, how was it measured, and when did it start?"     no 7. RESPIRATORY STATUS: "Describe your breathing?" (e.g., shortness of breath, wheezing, unable to speak)      No c/o difficulty breathing 8. BETTER-SAME-WORSE: "Are you getting better, staying the same or getting worse compared to yesterday?"  If getting worse, ask, "In what way?"     same 9. HIGH RISK DISEASE: "Do you have any chronic medical problems?" (e.g., asthma, heart or lung disease, weak immune system, obesity, etc.)     no 10. PREGNANCY: "Is there any chance you are pregnant?" "When was your last menstrual period?"       n/a 11. OTHER SYMPTOMS: "Do you have any other symptoms?"  (e.g., chills, fatigue, headache, loss of smell or taste, muscle pain, sore throat; new loss of smell or taste especially support the diagnosis of COVID-19)       no  Protocols used: CORONAVIRUS (COVID-19) DIAGNOSED OR SUSPECTED-A-AH, SORE THROAT-A-AH, CORONAVIRUS (COVID-19) EXPOSURE-A-AH

## 2019-03-13 DIAGNOSIS — H401132 Primary open-angle glaucoma, bilateral, moderate stage: Secondary | ICD-10-CM | POA: Diagnosis not present

## 2019-03-19 ENCOUNTER — Other Ambulatory Visit: Payer: Self-pay | Admitting: Family Medicine

## 2019-04-30 ENCOUNTER — Other Ambulatory Visit: Payer: Self-pay | Admitting: Family Medicine

## 2019-05-07 ENCOUNTER — Ambulatory Visit: Payer: Medicare Other | Admitting: Orthopaedic Surgery

## 2019-05-08 DIAGNOSIS — N952 Postmenopausal atrophic vaginitis: Secondary | ICD-10-CM | POA: Diagnosis not present

## 2019-05-08 DIAGNOSIS — N816 Rectocele: Secondary | ICD-10-CM | POA: Diagnosis not present

## 2019-05-08 DIAGNOSIS — N760 Acute vaginitis: Secondary | ICD-10-CM | POA: Diagnosis not present

## 2019-05-22 ENCOUNTER — Encounter: Payer: Self-pay | Admitting: Orthopaedic Surgery

## 2019-05-22 ENCOUNTER — Ambulatory Visit (INDEPENDENT_AMBULATORY_CARE_PROVIDER_SITE_OTHER): Payer: Medicare Other | Admitting: Orthopaedic Surgery

## 2019-05-22 ENCOUNTER — Ambulatory Visit (INDEPENDENT_AMBULATORY_CARE_PROVIDER_SITE_OTHER): Payer: Medicare Other

## 2019-05-22 ENCOUNTER — Other Ambulatory Visit: Payer: Self-pay

## 2019-05-22 VITALS — Ht 62.5 in | Wt 123.0 lb

## 2019-05-22 DIAGNOSIS — G8929 Other chronic pain: Secondary | ICD-10-CM

## 2019-05-22 DIAGNOSIS — M25512 Pain in left shoulder: Secondary | ICD-10-CM | POA: Insufficient documentation

## 2019-05-22 MED ORDER — BUPIVACAINE HCL 0.5 % IJ SOLN
2.0000 mL | INTRAMUSCULAR | Status: AC | PRN
  Administered 2019-05-22: 2 mL via INTRA_ARTICULAR

## 2019-05-22 MED ORDER — METHYLPREDNISOLONE ACETATE 40 MG/ML IJ SUSP
80.0000 mg | INTRAMUSCULAR | Status: AC | PRN
  Administered 2019-05-22: 13:00:00 80 mg via INTRA_ARTICULAR

## 2019-05-22 MED ORDER — LIDOCAINE HCL 2 % IJ SOLN
2.0000 mL | INTRAMUSCULAR | Status: AC | PRN
  Administered 2019-05-22: 13:00:00 2 mL

## 2019-05-22 NOTE — Progress Notes (Signed)
Office Visit Note   Patient: Rebecca [name]           Date of Birth: Jun 02, 1932           MRN: [id number] Visit Date: 05/22/2019              Requested by: [name], MD [place],  [place] [address] PCP: [name], MD   Assessment & Plan: Visit Diagnoses:  1. Chronic left shoulder pain     Plan: Impingement syndrome left shoulder with the possibility of a rotator cuff tear.  No history of injury or trauma.  We will try a subacromial cortisone injection and monitor response.  Consider MRI scan if persistent pain and compromise of activities  Follow-Up Instructions: Return if symptoms worsen or fail to improve.   Orders:  Orders Placed This Encounter  Procedures  . Large Joint Inj: R subacromial bursa  . XR Shoulder Left   No orders of the defined types were placed in this encounter.     Procedures: Large Joint Inj: R subacromial bursa on 05/22/2019 1:25 PM Indications: pain and diagnostic evaluation Details: 25 G 1.5 in needle, anterolateral approach  Arthrogram: No  Medications: 2 mL lidocaine 2 %; 2 mL bupivacaine 0.5 %; 80 mg methylPREDNISolone acetate 40 MG/ML Consent was given by the patient. Immediately prior to procedure a time out was called to verify the correct patient, procedure, equipment, support staff and site/side marked as required. Patient was prepped and draped in the usual sterile fashion.       Clinical Data: No additional findings.   Subjective: Chief Complaint  Patient presents with  . Left Shoulder - Pain  Patient presents today for left shoulder pain. No known injury. It has been hurting for two months. She cannot pinpoint the area of pain, but states that it radiates down her arm. She cannot reach around to her backside. Some pain with range of motion. She takes Motrin as needed. She feels like her left arm is getting weaker. No numbness or tingling. She is right hand dominant.   HPI  Review of  Systems   Objective: Vital Signs: Ht 5' 2.5" (1.588 m)   Wt 123 lb (55.8 kg)   BMI 22.14 kg/m   Physical Exam Constitutional:      Appearance: She is well-developed.  Eyes:     Pupils: Pupils are equal, round, and reactive to light.  Pulmonary:     Effort: Pulmonary effort is normal.  Skin:    General: Skin is warm and dry.  Neurological:     Mental Status: She is alert and oriented to person, place, and time.  Psychiatric:        Behavior: Behavior normal.     Ortho Exam awake alert and oriented x3.  Comfortable sitting.  Able to place left arm fully overhead and fairly quickly.  No obvious weakness with abduction.  No weakness with internal or external rotation.  Skin intact.  Biceps intact.  Does have mild impingement on external rotation and mild empty can testing.  No crepitation.  Mild anterior and lateral subacromial pain.  No pain at the [facility] joint  Specialty Comments:  No specialty comments available.  Imaging: XR Shoulder Left  Result Date: 05/22/2019 Films of the left shoulder obtained in several projections.  Humeral head is centered about the glenoid.  Normal space between the humeral head and the acromion.  Minor degenerative changes at the [facility] joint.  No  ectopic calcification.  No obvious glenohumeral arthritis.  No osseous abnormalities    PMFS History: Patient Active Problem List   Diagnosis Date Noted  . Pain in left shoulder 05/22/2019  . Cellulitis of left upper extremity 10/15/2016  . 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 Diverticulosis of colon without hemorrhage 02/04/2015  . Unspecified vitamin D deficiency 12/25/2013  . UTI (urinary tract infection) 09/29/2011  . 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 Atherosclerosis 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 04/20/2011  . SPINAL STENOSIS, LUMBAR 03/09/2010  . OTHER OSTEOPOROSIS 03/09/2010  . SCOLIOSIS, LUMBAR SPINE 03/09/2010  . MIGRAINE,  OPHTHALMIC 10/29/2009  . LUMBAR RADICULOPATHY, ATYPICAL 10/29/2009  . POLYNEUROPATHY OTHER DISEASES CLASSIFIED ELSW 09/22/2009  . UNSPECIFIED PERIPHERAL VASCULAR DISEASE 09/22/2009  . ADENOMATOUS COLONIC POLYP 06/10/2009  . History of iron deficiency 05/06/2009  . CAROTID ARTERY DISEASE 07/28/2008  . Allergic rhinitis 03/20/2008  . DYSPHAGIA 03/12/2008  . GANGLION OF TENDON SHEATH 08/17/2007  . Hypothyroidism 11/15/2006  . Hyperlipidemia 11/07/2006  . ADVEF, DRUG/MEDICINAL/BIOLOGICAL SUBST NOS 11/07/2006  . Essential hypertension 10/18/2006  . GERD 10/18/2006  . DIVERTICULOSIS, COLON 10/18/2006   Past Medical History:  Diagnosis Date  . Anemia   . Anxiety   . Diverticulitis   . Diverticulosis   . Gastroparesis   . GERD (gastroesophageal reflux disease)   . Glaucoma   . Hemorrhoids   . Hyperlipidemia   . Hypertension   . Hypertension   . IBS (irritable bowel syndrome)   . Rectal ulcer   . Segmental colitis ([place])   . Tubular adenoma of colon 05/2009    Family History  Problem Relation Age of Onset  . Diabetes Mother   . Heart disease Mother   . Hyperlipidemia Mother   . Hypertension Mother   . Anuerysm Father   . Heart disease Father   . Hypertension Father   . Migraines Daughter   . Colon polyps Sister   . Lung cancer Sister   . Colon polyps Brother   . Diabetes Brother   . Hyperlipidemia Brother   . Hypertension Brother   . Colon polyps Daughter   . Breast cancer Daughter   . Colon cancer Maternal Aunt        Rectal cancer  . Cancer Daughter        salivary  . Colon cancer Paternal Grandmother        with possible stomach cancer  . Heart disease Brother   . Crohn's disease Neg Hx   . Pancreatic cancer Neg Hx     Past Surgical History:  Procedure Laterality Date  . APPENDECTOMY    . ARCUATE KERATECTOMY    . CATARACT EXTRACTION, BILATERAL  2015  . double pallital tori bone removed     . HERNIA REPAIR     umbilical  . KNEE SURGERY Bilateral   .  morton's neuroma on rt foot and platar facial release    . MOUTH SURGERY     "bone shaved off roof of mouth"  . SALIVARY GLAND SURGERY     removal  . TONSILLECTOMY     Social History   Occupational History  . Occupation: Glass blower/designer    Comment: retired  Tobacco Use  . Smoking status: Never Smoker  . Smokeless tobacco: Never Used  Substance and Sexual Activity  . Alcohol use: Yes    Alcohol/week: 0.0 standard drinks    Comment: rare  . Drug use: No  . Sexual activity: Yes

## 2019-05-31 ENCOUNTER — Encounter: Payer: Self-pay | Admitting: Family Medicine

## 2019-05-31 ENCOUNTER — Other Ambulatory Visit: Payer: Self-pay | Admitting: Family Medicine

## 2019-06-02 MED ORDER — LEVOTHYROXINE SODIUM 88 MCG PO TABS: 88.0000 ug | ORAL_TABLET | Freq: Every day | ORAL | 5 refills | Status: DC

## 2019-06-02 NOTE — Telephone Encounter (Signed)
I sent in new rx for generic thyroid medication.  Will repeat TSH at follow up.

## 2019-06-11 ENCOUNTER — Ambulatory Visit (INDEPENDENT_AMBULATORY_CARE_PROVIDER_SITE_OTHER): Payer: Medicare Other | Admitting: Orthopaedic Surgery

## 2019-06-11 ENCOUNTER — Other Ambulatory Visit: Payer: Self-pay

## 2019-06-11 ENCOUNTER — Encounter: Payer: Self-pay | Admitting: Orthopaedic Surgery

## 2019-06-11 VITALS — Ht 62.5 in | Wt 123.0 lb

## 2019-06-11 DIAGNOSIS — G8929 Other chronic pain: Secondary | ICD-10-CM

## 2019-06-11 DIAGNOSIS — M25512 Pain in left shoulder: Secondary | ICD-10-CM | POA: Diagnosis not present

## 2019-06-11 DIAGNOSIS — M170 Bilateral primary osteoarthritis of knee: Secondary | ICD-10-CM | POA: Diagnosis not present

## 2019-06-11 MED ORDER — LIDOCAINE HCL 1 % IJ SOLN
2.0000 mL | INTRAMUSCULAR | Status: AC | PRN
  Administered 2019-06-11: 2 mL

## 2019-06-11 MED ORDER — BUPIVACAINE HCL 0.5 % IJ SOLN
2.0000 mL | INTRAMUSCULAR | Status: AC | PRN
  Administered 2019-06-11: 2 mL via INTRA_ARTICULAR

## 2019-06-11 MED ORDER — METHYLPREDNISOLONE ACETATE 40 MG/ML IJ SUSP
80.0000 mg | INTRAMUSCULAR | Status: AC | PRN
  Administered 2019-06-11: 80 mg via INTRA_ARTICULAR

## 2019-06-11 NOTE — Progress Notes (Signed)
Office Visit Note   Patient: Rebecca [name]           Date of Birth: 04/07/1932           MRN: [id number] Visit Date: 06/11/2019              Requested by: [name], MD [place],   [id number] PCP: [name], MD   Assessment & Plan: Visit Diagnoses:  1. Chronic left shoulder pain   2. Bilateral primary osteoarthritis of knee     Plan: Persistent left shoulder pain after cortisone injection subacromially 3 weeks ago.  Will order MRI scan.  I think there is a possibility of a rotator cuff tear x-rays were negative for any obvious arthritis or rotator cuff arthropathy.  Having bilateral knee pain.  Prior films in 2019 demonstrated advanced osteoarthritis.  Having more trouble on the left.  Will inject the left knee with cortisone.  Has had some problem in the past with cortisone and elevation of intraocular pressure but has been cleared by her ophthalmologist for subsequent injections  Follow-Up Instructions: Return After MRI scan left shoulder.   Orders:  Orders Placed This Encounter  Procedures  . Large Joint Inj: L knee  . MR Shoulder Left w/o contrast   No orders of the defined types were placed in this encounter.     Procedures: Large Joint Inj: L knee on 06/11/2019 1:21 PM Indications: pain and diagnostic evaluation Details: 25 G 1.5 in needle, anteromedial approach  Arthrogram: No  Medications: 2 mL lidocaine 1 %; 2 mL bupivacaine 0.5 %; 80 mg methylPREDNISolone acetate 40 MG/ML Procedure, treatment alternatives, risks and benefits explained, specific risks discussed. Consent was given by the patient. Patient was prepped and draped in the usual sterile fashion.       Clinical Data: No additional findings.   Subjective: Chief Complaint  Patient presents with  . Left Knee - Pain  . Left Shoulder - Pain  Patient presents today for recurrent left knee pain. She twisted her knee while stepping up on the porch last  Thursday 06/06/2019. She said that she did not fall, but has been hurting anteriorly since. No swelling. Her pain is worse with walking. No grinding or giving way.  Has been wearing a pullover knee support that seems to make a difference.  Had films of her knee in 2019 revealing degenerative changes in all 3 compartments bilaterally She also wants to have her left shoulder rechecked. She was here on 05/22/2019 and received a cortisone injection. She states that it seemed to help for a bit, but now hurts like it did before the injection. She is taking motrin for her joint pains.  Still having some difficulty with overhead motion and sleeping on that side.  HPI  Review of Systems   Objective: Vital Signs: Ht 5' 2.5" (1.588 m)   Wt 123 lb (55.8 kg)   BMI 22.14 kg/m   Physical Exam Constitutional:      Appearance: She is well-developed.  Eyes:     Pupils: Pupils are equal, round, and reactive to light.  Pulmonary:     Effort: Pulmonary effort is normal.  Skin:    General: Skin is warm and dry.  Neurological:     Mental Status: She is alert and oriented to person, place, and time.  Psychiatric:        Behavior: Behavior normal.     Ortho Exam small effusion left knee and none  on the right.  Having predominately medial joint pain left knee but experiencing some pain beneath the patella with crepitation and laterally as well.  Full extension flexion over 105 degrees.  No instability.  No popping or clicking.  No popliteal pain.  Straight leg raise negative.  Painless range of motion left hip  Not much change in her left shoulder exam compared to several weeks ago.  Skin intact.  Biceps muscle in its normal position.  No significant weakness with internal or external rotation.  Did have some mild impingement testing particularly in external rotation and a mild empty can testing.  No crepitation.  Minimal discomfort in the anterior subacromial region.  None at the [facility] joint abduct at least 95  degrees with minimal discomfort  Specialty Comments:  No specialty comments available.  Imaging: No results found.   PMFS History: Patient Active Problem List   Diagnosis Date Noted  . Bilateral primary osteoarthritis of knee 06/11/2019  . Pain in left shoulder 05/22/2019  . Cellulitis of left upper extremity 10/15/2016  . 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 Diverticulosis of colon without hemorrhage 02/04/2015  . Unspecified vitamin D deficiency 12/25/2013  . UTI (urinary tract infection) 09/29/2011  . 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 Atherosclerosis 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 04/20/2011  . SPINAL STENOSIS, LUMBAR 03/09/2010  . OTHER OSTEOPOROSIS 03/09/2010  . SCOLIOSIS, LUMBAR SPINE 03/09/2010  . MIGRAINE, OPHTHALMIC 10/29/2009  . LUMBAR RADICULOPATHY, ATYPICAL 10/29/2009  . POLYNEUROPATHY OTHER DISEASES CLASSIFIED ELSW 09/22/2009  . UNSPECIFIED PERIPHERAL VASCULAR DISEASE 09/22/2009  . ADENOMATOUS COLONIC POLYP 06/10/2009  . History of iron deficiency 05/06/2009  . CAROTID ARTERY DISEASE 07/28/2008  . Allergic rhinitis 03/20/2008  . DYSPHAGIA 03/12/2008  . GANGLION OF TENDON SHEATH 08/17/2007  . Hypothyroidism 11/15/2006  . Hyperlipidemia 11/07/2006  . ADVEF, DRUG/MEDICINAL/BIOLOGICAL SUBST NOS 11/07/2006  . Essential hypertension 10/18/2006  . GERD 10/18/2006  . DIVERTICULOSIS, COLON 10/18/2006   Past Medical History:  Diagnosis Date  . Anemia   . Anxiety   . Diverticulitis   . Diverticulosis   . Gastroparesis   . GERD (gastroesophageal reflux disease)   . Glaucoma   . Hemorrhoids   . Hyperlipidemia   . Hypertension   . Hypertension   . IBS (irritable bowel syndrome)   . Rectal ulcer   . Segmental colitis ([place])   . Tubular adenoma of colon 05/2009    Family History  Problem Relation Age of Onset  . Diabetes Mother   . Heart disease Mother   . Hyperlipidemia Mother   . Hypertension  Mother   . Anuerysm Father   . Heart disease Father   . Hypertension Father   . Migraines Daughter   . Colon polyps Sister   . Lung cancer Sister   . Colon polyps Brother   . Diabetes Brother   . Hyperlipidemia Brother   . Hypertension Brother   . Colon polyps Daughter   . Breast cancer Daughter   . Colon cancer Maternal Aunt        Rectal cancer  . Cancer Daughter        salivary  . Colon cancer Paternal Grandmother        with possible stomach cancer  . Heart disease Brother   . Crohn's disease Neg Hx   . Pancreatic cancer Neg Hx     Past Surgical History:  Procedure Laterality Date  . APPENDECTOMY    . ARCUATE KERATECTOMY    .  CATARACT EXTRACTION, BILATERAL  2015  . double pallital tori bone removed     . HERNIA REPAIR     umbilical  . KNEE SURGERY Bilateral   . morton's neuroma on rt foot and platar facial release    . MOUTH SURGERY     "bone shaved off roof of mouth"  . SALIVARY GLAND SURGERY     removal  . TONSILLECTOMY     Social History   Occupational History  . Occupation: Glass blower/designer    Comment: retired  Tobacco Use  . Smoking status: Never Smoker  . Smokeless tobacco: Never Used  Substance and Sexual Activity  . Alcohol use: Yes    Alcohol/week: 0.0 standard drinks    Comment: rare  . Drug use: No  . Sexual activity: Yes

## 2019-07-09 DIAGNOSIS — N816 Rectocele: Secondary | ICD-10-CM | POA: Diagnosis not present

## 2019-07-09 DIAGNOSIS — N8111 Cystocele, midline: Secondary | ICD-10-CM | POA: Diagnosis not present

## 2019-07-19 ENCOUNTER — Ambulatory Visit
Admission: RE | Admit: 2019-07-19 | Discharge: 2019-07-19 | Disposition: A | Discharge status: Home / Self Care | Payer: Medicare Other | Source: Ambulatory Visit | Attending: Orthopaedic Surgery | Admitting: Orthopaedic Surgery

## 2019-07-19 DIAGNOSIS — G8929 Other chronic pain: Secondary | ICD-10-CM

## 2019-07-19 DIAGNOSIS — S43432A Superior glenoid labrum lesion of left shoulder, initial encounter: Secondary | ICD-10-CM | POA: Diagnosis not present

## 2019-07-23 ENCOUNTER — Ambulatory Visit (INDEPENDENT_AMBULATORY_CARE_PROVIDER_SITE_OTHER): Payer: Medicare Other | Admitting: Orthopaedic Surgery

## 2019-07-23 ENCOUNTER — Other Ambulatory Visit: Payer: Self-pay

## 2019-07-23 ENCOUNTER — Encounter: Payer: Self-pay | Admitting: Orthopaedic Surgery

## 2019-07-23 VITALS — Ht 62.5 in | Wt 123.0 lb

## 2019-07-23 DIAGNOSIS — M25512 Pain in left shoulder: Secondary | ICD-10-CM

## 2019-07-23 DIAGNOSIS — G8929 Other chronic pain: Secondary | ICD-10-CM | POA: Diagnosis not present

## 2019-07-23 MED ORDER — LIDOCAINE HCL 1 % IJ SOLN
2.0000 mL | INTRAMUSCULAR | Status: AC | PRN
  Administered 2019-07-23: 2 mL

## 2019-07-23 MED ORDER — METHYLPREDNISOLONE ACETATE 40 MG/ML IJ SUSP
80.0000 mg | INTRAMUSCULAR | Status: AC | PRN
  Administered 2019-07-23: 80 mg via INTRA_ARTICULAR

## 2019-07-23 MED ORDER — BUPIVACAINE HCL 0.5 % IJ SOLN
2.0000 mL | INTRAMUSCULAR | Status: AC | PRN
  Administered 2019-07-23: 2 mL via INTRA_ARTICULAR

## 2019-07-23 NOTE — Progress Notes (Signed)
Office Visit Note   Patient: Rebecca [name]           Date of Birth: 1933/03/24           MRN: [id number] Visit Date: 07/23/2019              Requested by: [name], MD [place],  [place] [address] PCP: [name], MD   Assessment & Plan: Visit Diagnoses:  1. Chronic left shoulder pain     Plan: Mrs. Piltz had an MRI scan of her left shoulder that demonstrated tearing of the posterior and posterior inferior labrum as well as tendinopathy of the supraspinatus and subscapularis.  There was mild degenerative joint arthropathy with moderate glenohumeral chondral thinning.  Long discussion regarding the above.  I think it is worth injecting the glenohumeral joint left shoulder monitoring her response and sending her to physical [place] so we will set this up at [place] and return in the next 4 to 6 weeks if no improvement  Follow-Up Instructions: Return if symptoms worsen or fail to improve.   Orders:  No orders of the defined types were placed in this encounter.  No orders of the defined types were placed in this encounter.     Procedures: Large Joint Inj: L glenohumeral on 07/23/2019 1:29 PM Details: 25 G 1.5 in needle, anteromedial approach  Arthrogram: No  Medications: 2 mL lidocaine 1 %; 2 mL bupivacaine 0.5 %; 80 mg methylPREDNISolone acetate 40 MG/ML Procedure, treatment alternatives, risks and benefits explained, specific risks discussed. Consent was given by the patient. Immediately prior to procedure a time out was called to verify the correct patient, procedure, equipment, support staff and site/side marked as required. Patient was prepped and draped in the usual sterile fashion.       Clinical Data: No additional findings.   Subjective: Chief Complaint  Patient presents with  . Left Shoulder - Follow-up    MRI review  Patient presents today for left shoulder follow up. She had an MRI  of her left shoulder done on 07/19/2019 and is here today to discuss those results.  HPI  Review of Systems   Objective: Vital Signs: Ht 5' 2.5" (1.588 m)   Wt 123 lb (55.8 kg)   BMI 22.14 kg/m   Physical Exam Constitutional:      Appearance: She is well-developed.  Eyes:     Pupils: Pupils are equal, round, and reactive to light.  Pulmonary:     Effort: Pulmonary effort is normal.  Skin:    General: Skin is warm and dry.  Neurological:     Mental Status: She is alert and oriented to person, place, and time.  Psychiatric:        Behavior: Behavior normal.     Ortho Exam awake alert and oriented x3.  Comfortable sitting.  Able to place her left arm overhead with some discomfort.  Minimally positive impingement testing and empty can testing.  Good strength.  No crepitation.  No ecchymosis.  Biceps intact.  Good grip and release  Specialty Comments:  No specialty comments available.  Imaging: No results found.   PMFS History: Patient Active Problem List   Diagnosis Date Noted  . Bilateral primary osteoarthritis of knee 06/11/2019  . Pain in left shoulder 05/22/2019  . Cellulitis of left upper extremity 10/15/2016  . 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 Diverticulosis of colon without hemorrhage 02/04/2015  .  Unspecified vitamin D deficiency 12/25/2013  . UTI (urinary tract infection) 09/29/2011  . 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 Atherosclerosis 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 04/20/2011  . SPINAL STENOSIS, LUMBAR 03/09/2010  . OTHER OSTEOPOROSIS 03/09/2010  . SCOLIOSIS, LUMBAR SPINE 03/09/2010  . MIGRAINE, OPHTHALMIC 10/29/2009  . LUMBAR RADICULOPATHY, ATYPICAL 10/29/2009  . POLYNEUROPATHY OTHER DISEASES CLASSIFIED ELSW 09/22/2009  . UNSPECIFIED PERIPHERAL VASCULAR DISEASE 09/22/2009  . ADENOMATOUS COLONIC POLYP 06/10/2009  . History of iron deficiency 05/06/2009  . CAROTID ARTERY DISEASE  07/28/2008  . Allergic rhinitis 03/20/2008  . DYSPHAGIA 03/12/2008  . GANGLION OF TENDON SHEATH 08/17/2007  . Hypothyroidism 11/15/2006  . Hyperlipidemia 11/07/2006  . ADVEF, DRUG/MEDICINAL/BIOLOGICAL SUBST NOS 11/07/2006  . Essential hypertension 10/18/2006  . GERD 10/18/2006  . DIVERTICULOSIS, COLON 10/18/2006   Past Medical History:  Diagnosis Date  . Anemia   . Anxiety   . Diverticulitis   . Diverticulosis   . Gastroparesis   . GERD (gastroesophageal reflux disease)   . Glaucoma   . Hemorrhoids   . Hyperlipidemia   . Hypertension   . Hypertension   . IBS (irritable bowel syndrome)   . Rectal ulcer   . Segmental colitis ([place])   . Tubular adenoma of colon 05/2009    Family History  Problem Relation Age of Onset  . Diabetes Mother   . Heart disease Mother   . Hyperlipidemia Mother   . Hypertension Mother   . Anuerysm Father   . Heart disease Father   . Hypertension Father   . Migraines Daughter   . Colon polyps Sister   . Lung cancer Sister   . Colon polyps Brother   . Diabetes Brother   . Hyperlipidemia Brother   . Hypertension Brother   . Colon polyps Daughter   . Breast cancer Daughter   . Colon cancer Maternal Aunt        Rectal cancer  . Cancer Daughter        salivary  . Colon cancer Paternal Grandmother        with possible stomach cancer  . Heart disease Brother   . Crohn's disease Neg Hx   . Pancreatic cancer Neg Hx     Past Surgical History:  Procedure Laterality Date  . APPENDECTOMY    . ARCUATE KERATECTOMY    . CATARACT EXTRACTION, BILATERAL  2015  . double pallital tori bone removed     . HERNIA REPAIR     umbilical  . KNEE SURGERY Bilateral   . morton's neuroma on rt foot and platar facial release    . MOUTH SURGERY     "bone shaved off roof of mouth"  . SALIVARY GLAND SURGERY     removal  . TONSILLECTOMY     Social History   Occupational History  . Occupation: Glass blower/designer    Comment: retired  Tobacco Use  . Smoking  status: Never Smoker  . Smokeless tobacco: Never Used  Substance and Sexual Activity  . Alcohol use: Yes    Alcohol/week: 0.0 standard drinks    Comment: rare  . Drug use: No  . Sexual activity: Yes

## 2019-07-23 NOTE — Addendum Note (Signed)
Addended by: Lendon Collar on: 07/23/2019 01:35 PM   Modules accepted: Orders

## 2019-07-29 ENCOUNTER — Ambulatory Visit: Payer: Medicare Other | Attending: Orthopaedic Surgery | Admitting: Physical Therapy

## 2019-07-29 ENCOUNTER — Other Ambulatory Visit: Payer: Self-pay

## 2019-07-29 ENCOUNTER — Encounter: Payer: Self-pay | Admitting: Physical Therapy

## 2019-07-29 DIAGNOSIS — G8929 Other chronic pain: Secondary | ICD-10-CM | POA: Insufficient documentation

## 2019-07-29 DIAGNOSIS — M25512 Pain in left shoulder: Secondary | ICD-10-CM | POA: Insufficient documentation

## 2019-07-29 DIAGNOSIS — M25612 Stiffness of left shoulder, not elsewhere classified: Secondary | ICD-10-CM | POA: Insufficient documentation

## 2019-07-29 DIAGNOSIS — M6281 Muscle weakness (generalized): Secondary | ICD-10-CM | POA: Insufficient documentation

## 2019-07-29 NOTE — Therapy (Signed)
[place] [address] [place], [place], [address] Phone: [phone]   Fax:  [phone]  Physical Therapy Evaluation  Patient Details  Name: Rebecca [name] MRN: [id number] Date of Birth: [birth year] Referring Provider (PT): Joni Fears   Encounter Date: 07/29/2019  PT End of Session - 07/29/19 1651    Visit Number  1    Date for PT Re-Evaluation  09/28/19    PT Start Time  1443    PT Stop Time  1528    PT Time Calculation (min)  45 min    Activity Tolerance  Patient tolerated treatment well    Behavior During Therapy  [facility] for tasks assessed/performed       Past Medical History:  Diagnosis Date  . Anemia   . Anxiety   . Diverticulitis   . Diverticulosis   . Gastroparesis   . GERD (gastroesophageal reflux disease)   . Glaucoma   . Hemorrhoids   . Hyperlipidemia   . Hypertension   . Hypertension   . IBS (irritable bowel syndrome)   . Rectal ulcer   . Segmental colitis ([place])   . Tubular adenoma of colon 05/2009    Past Surgical History:  Procedure Laterality Date  . APPENDECTOMY    . ARCUATE KERATECTOMY    . CATARACT EXTRACTION, BILATERAL  2015  . double pallital tori bone removed     . HERNIA REPAIR     umbilical  . KNEE SURGERY Bilateral   . morton's neuroma on rt foot and platar facial release    . MOUTH SURGERY     "bone shaved off roof of mouth"  . SALIVARY GLAND SURGERY     removal  . TONSILLECTOMY      There were no vitals filed for this visit.   Subjective Assessment - 07/29/19 1442    Subjective  Pt reports L shoulder pain starting beginning of 2021 ~Jan. Pt does not remember specific instance of trauma to shoulder; says she may have injured it [place] Pt has had imaging and was recommended to try a round of PT rather than have surgery. Pt received injection to L shoulder last Tuesday 4/20 and reports that her shoulder is feeling  better since then. Pt reports that she has the most difficulty reaching behind her back and reaching behind her head.    Limitations  Lifting;House hold activities;Other (comment)   Reaching overhead   Patient Stated Goals  decrease pain, be able to put on coats/sweaters with ease, washing hair/back    Currently in Pain?  Yes    Pain Score  0-No pain    Pain Location  Shoulder    Pain Orientation  Left    Pain Descriptors / Indicators  Sore;Aching    Pain Type  Acute pain    Pain Radiating Towards  no radiating pain    Pain Onset  More than a month ago    Pain Frequency  Intermittent    Aggravating Factors   reaching behind back, reaching overhead, lifting    Pain Relieving Factors  ice, rest, avoiding painful positions         [facility] PT Assessment - 07/29/19 0001      Assessment   Medical Diagnosis  L shoulder pain    Referring Provider (PT)  Joni Fears    Onset Date/Surgical Date  04/05/19    Hand Dominance  Right  Next MD Visit  09/03/2019    Prior Therapy  None      Precautions   Precautions  None      Restrictions   Weight Bearing Restrictions  No      Balance Screen   Has the patient fallen in the past 6 months  No    Has the patient had a decrease in activity level because of a fear of falling?   No    Is the patient reluctant to leave their home because of a fear of falling?   No      Home Film/video editor residence      Prior Function   Level of Independence  Independent    Vocation  Retired      Media planner Intact      Posture/Postural Control   Posture/Postural Control  Postural limitations    Postural Limitations  Rounded Shoulders;Forward head;Increased thoracic kyphosis    Posture Comments  Scoliosis causes postural abnormalities in seated and standing positions      ROM / Strength   AROM / PROM / Strength  AROM;Strength      AROM   Overall AROM   Deficits    Overall AROM Comments  Pt WFL  shoulder flexion/abduction. Shoulder R ER AROM 10 deg, L ER AROM 5 deg. Pt limited in functional test of ER and IR bilaterally L>R.      Strength   Overall Strength  Deficits    Right/Left Shoulder  Right;Left    Right Shoulder Flexion  4+/5    Right Shoulder Extension  4+/5    Right Shoulder ABduction  4+/5    Right Shoulder Internal Rotation  4+/5    Right Shoulder External Rotation  4+/5    Left Shoulder Flexion  4+/5    Left Shoulder Extension  4+/5    Left Shoulder ABduction  4/5    Left Shoulder Internal Rotation  4-/5    Left Shoulder External Rotation  4-/5    Right/Left Elbow  Right;Left    Right Elbow Flexion  5/5    Right Elbow Extension  5/5    Left Elbow Flexion  5/5    Left Elbow Extension  5/5      Flexibility   Soft Tissue Assessment /Muscle Length  --      Palpation   Palpation comment  tender to palpation L upper trap, cervical/thoracic paraspinals, L teres major      Special Tests    Special Tests  Biceps/Labral Tests    Biceps/Labral tests  Speeds Test      Speeds test   findings  Negative    Side  Left                Objective measurements completed on examination: See above findings.      [place] Adult PT Treatment/Exercise - 07/29/19 0001      Exercises   Exercises  Shoulder;Neck      Neck Exercises: Theraband   Scapula Retraction  10 reps;Blue    Scapula Retraction Limitations  cues for relaxing shoulders during exercise    Shoulder External Rotation  10 reps;Blue    Shoulder External Rotation Limitations  cues for form      Shoulder Exercises: Stretch   Other Shoulder Stretches  doorway pec stretch B x 30 sec      Neck Exercises: Stretches   Upper Trapezius Stretch  2 reps;30 seconds;Right;Left  Upper Trapezius Stretch Limitations  cues for sidebending instead of cervical rotation             PT Education - 07/29/19 1650    Education Details  Pt educated on HEP, condition, and rehabilitation process    Person(s)  Educated  Patient    Methods  Explanation;Demonstration    Comprehension  Verbalized understanding;Returned demonstration       PT Short Term Goals - 07/29/19 1751      PT SHORT TERM GOAL #1   Title  Pt will be independent with HEP    Time  2    Period  Weeks    Status  New    Target Date  08/12/19        PT Long Term Goals - 07/29/19 1751      PT LONG TERM GOAL #1   Title  Pt will demonstrate L shoulder ER/IR AROM equivalen to R shoulder    Time  6    Period  Weeks    Status  New    Target Date  09/09/19      PT LONG TERM GOAL #2   Title  Pt will demonstrate ability to reach behind back to ~T10 with reports of no increased L shoulder pain in order to perform functional ADLs    Time  6    Period  Weeks    Status  New    Target Date  09/09/19      PT LONG TERM GOAL #3   Title  Pt will demonstrate ability to reach behind head with no reports of increase in L shoulder pain in order to perform grooming/bathing ADLs    Time  6    Period  Weeks    Status  New    Target Date  09/09/19      PT LONG TERM GOAL #4   Title  Pt will reduce L shoulder pain by 50% in order to return to functional ADLs    Time  6    Period  Weeks    Status  New    Target Date  09/09/19      PT LONG TERM GOAL #5   Title  Pt will demonstrate ability to reach overhead to counter and retrieve 5# objects with no reports of increase in L shoulder pain to return to pain free ADLs    Time  6    Period  Weeks    Status  New    Target Date  09/09/19             Plan - 07/29/19 1652    Clinical Impression Statement  Pt presents to clinic with reports of L shoulder pain persisting for the past ~3 months. Pt imaging reflects torn post inf labrum as well as tendinopathy of the supraspinatus and subscapularis. Pt was given an injection to L shoulder which she reports has alleviated much of her pain. Pt demonstrates UE strength and ROM deficits on L as compared to R, particularly in IR/ER  ROM/strength. Pt is tender to palpation L upper trap, L thoracic/cervical paraspinals, L posterior shoulder. Pt has scoliosis and presents with forward head posture, rounded shoulders, and increased thoracic kypohsis. Pt would benefit from skiled PT in order to address the above deficits.    Personal Factors and Comorbidities  Age;Comorbidity 1;Fitness;Sex;Time since onset of injury/illness/exacerbation    Comorbidities  HTN    Examination-Activity Limitations  Bathing;Carry;Dressing;Hygiene/Grooming;Lift;Sleep;Reach Overhead    Examination-Participation Restrictions  Cleaning;Laundry;Meal  Prep    Stability/Clinical Decision Making  Stable/Uncomplicated    Clinical Decision Making  Low    Rehab Potential  Good    PT Frequency  2x / week    PT Duration  6 weeks    PT Treatment/Interventions  ADLs/Self Care Home Management;Electrical Stimulation;Ultrasound;Moist Heat;Functional mobility training;Therapeutic activities;Therapeutic exercise;Neuromuscular re-education;Manual techniques;Patient/family education;Passive range of motion;Dry needling    PT Next Visit Plan  Initiate L UE strengthening/ROM. Incorporate cervical ROM/flexiblity as indicated. Manual as indicated.    PT Home Exercise Plan  scapular retractions, ER with TB, upper trap stretch    Consulted and Agree with Plan of Care  Patient       Patient will benefit from skilled therapeutic intervention in order to improve the following deficits and impairments:  Decreased range of motion, Impaired UE functional use, Decreased activity tolerance, Pain, Impaired flexibility, Improper body mechanics, Decreased mobility, Decreased strength, Postural dysfunction  Visit Diagnosis: Chronic left shoulder pain  Muscle weakness (generalized)  Decreased range of motion of left shoulder     Problem List Patient Active Problem List   Diagnosis Date Noted  . Bilateral primary osteoarthritis of knee 06/11/2019  . Pain in left shoulder  05/22/2019  . Cellulitis of left upper extremity 10/15/2016  . 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 Diverticulosis of colon without hemorrhage 02/04/2015  . Unspecified vitamin D deficiency 12/25/2013  . UTI (urinary tract infection) 09/29/2011  . 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 Atherosclerosis 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 04/20/2011  . SPINAL STENOSIS, LUMBAR 03/09/2010  . OTHER OSTEOPOROSIS 03/09/2010  . SCOLIOSIS, LUMBAR SPINE 03/09/2010  . MIGRAINE, OPHTHALMIC 10/29/2009  . LUMBAR RADICULOPATHY, ATYPICAL 10/29/2009  . POLYNEUROPATHY OTHER DISEASES CLASSIFIED ELSW 09/22/2009  . UNSPECIFIED PERIPHERAL VASCULAR DISEASE 09/22/2009  . ADENOMATOUS COLONIC POLYP 06/10/2009  . History of iron deficiency 05/06/2009  . CAROTID ARTERY DISEASE 07/28/2008  . Allergic rhinitis 03/20/2008  . DYSPHAGIA 03/12/2008  . GANGLION OF TENDON SHEATH 08/17/2007  . Hypothyroidism 11/15/2006  . Hyperlipidemia 11/07/2006  . ADVEF, DRUG/MEDICINAL/BIOLOGICAL SUBST NOS 11/07/2006  . Essential hypertension 10/18/2006  . GERD 10/18/2006  . DIVERTICULOSIS, COLON 10/18/2006   Amador Cunas, PT, DPT [facility] [place]ie Coppa 07/29/2019, 5:57 PM  [place] [address] [place], [place], [address] Phone: [phone]   Fax:  [phone]  Name: Rebecca [name] MRN: [id number] Date of Birth: 1932/09/27

## 2019-07-29 NOTE — Addendum Note (Signed)
Addended by: Dawayne Patricia on: 07/29/2019 06:23 PM   Modules accepted: Orders

## 2019-08-05 ENCOUNTER — Ambulatory Visit: Payer: Medicare Other | Admitting: Physical Therapy

## 2019-08-06 DIAGNOSIS — M4157 Other secondary scoliosis, lumbosacral region: Secondary | ICD-10-CM | POA: Diagnosis not present

## 2019-08-06 DIAGNOSIS — M5416 Radiculopathy, lumbar region: Secondary | ICD-10-CM | POA: Diagnosis not present

## 2019-08-06 DIAGNOSIS — M5136 Other intervertebral disc degeneration, lumbar region: Secondary | ICD-10-CM | POA: Diagnosis not present

## 2019-08-07 ENCOUNTER — Telehealth: Payer: Self-pay | Admitting: Family Medicine

## 2019-08-07 ENCOUNTER — Other Ambulatory Visit: Payer: Self-pay

## 2019-08-07 ENCOUNTER — Ambulatory Visit: Payer: Medicare Other | Attending: Orthopaedic Surgery | Admitting: Physical Therapy

## 2019-08-07 ENCOUNTER — Encounter: Payer: Self-pay | Admitting: Physical Therapy

## 2019-08-07 ENCOUNTER — Telehealth (INDEPENDENT_AMBULATORY_CARE_PROVIDER_SITE_OTHER): Payer: Medicare Other | Admitting: Family Medicine

## 2019-08-07 DIAGNOSIS — I809 Phlebitis and thrombophlebitis of unspecified site: Secondary | ICD-10-CM | POA: Diagnosis not present

## 2019-08-07 DIAGNOSIS — M6281 Muscle weakness (generalized): Secondary | ICD-10-CM | POA: Diagnosis not present

## 2019-08-07 DIAGNOSIS — M25512 Pain in left shoulder: Secondary | ICD-10-CM | POA: Insufficient documentation

## 2019-08-07 DIAGNOSIS — M25612 Stiffness of left shoulder, not elsewhere classified: Secondary | ICD-10-CM | POA: Diagnosis not present

## 2019-08-07 DIAGNOSIS — M25551 Pain in right hip: Secondary | ICD-10-CM

## 2019-08-07 DIAGNOSIS — G8929 Other chronic pain: Secondary | ICD-10-CM | POA: Diagnosis not present

## 2019-08-07 NOTE — Telephone Encounter (Signed)
Please advise 

## 2019-08-07 NOTE — Telephone Encounter (Signed)
pt went to the surgical center yesterday she said they stuck her, and now her vein is swollen; wanted to discuss this with Dr  Please call [phone]

## 2019-08-07 NOTE — Therapy (Signed)
[place] [address] [place], [place], [address] Phone: [phone]   Fax:  [phone]  Physical Therapy Treatment  Patient Details  Name: Rebecca [name] MRN: [id number] Date of Birth: 1932/06/20 Referring Provider (PT): Joni Fears   Encounter Date: 08/07/2019  PT End of Session - 08/07/19 1527    Visit Number  2    Date for PT Re-Evaluation  09/28/19    PT Start Time  1445    PT Stop Time  1530    PT Time Calculation (min)  45 min    Activity Tolerance  Patient tolerated treatment well    Behavior During Therapy  [facility] for tasks assessed/performed       Past Medical History:  Diagnosis Date  . Anemia   . Anxiety   . Diverticulitis   . Diverticulosis   . Gastroparesis   . GERD (gastroesophageal reflux disease)   . Glaucoma   . Hemorrhoids   . Hyperlipidemia   . Hypertension   . Hypertension   . IBS (irritable bowel syndrome)   . Rectal ulcer   . Segmental colitis ([place])   . Tubular adenoma of colon 05/2009    Past Surgical History:  Procedure Laterality Date  . APPENDECTOMY    . ARCUATE KERATECTOMY    . CATARACT EXTRACTION, BILATERAL  2015  . double pallital tori bone removed     . HERNIA REPAIR     umbilical  . KNEE SURGERY Bilateral   . morton's neuroma on rt foot and platar facial release    . MOUTH SURGERY     "bone shaved off roof of mouth"  . SALIVARY GLAND SURGERY     removal  . TONSILLECTOMY      There were no vitals filed for this visit.  Subjective Assessment - 08/07/19 1451    Subjective  Pt reports she has no L shoulder pain today and exercises are going well.    Currently in Pain?  No/denies    Pain Score  0-No pain    Pain Location  Shoulder    Pain Orientation  Left                       [facility] Adult PT Treatment/Exercise - 08/07/19 0001      Neck Exercises: Machines for Strengthening   UBE (Upper Arm Bike)  4mn fwd/373m bkwd       Neck  Exercises: Theraband   Shoulder Extension  10 reps    Shoulder Extension Limitations  5# B 2x10      Neck Exercises: Standing   Other Standing Exercises  ball raise overhead x10    Other Standing Exercises  ball roll on wall x10      Manual Therapy   Manual Therapy  Soft tissue mobilization;Passive ROM    Soft tissue mobilization  STM to B upper trap with stretch     Passive ROM  PROM with focus on L shoulder ER and MET               PT Short Term Goals - 07/29/19 1751      PT SHORT TERM GOAL #1   Title  Pt will be independent with HEP    Time  2    Period  Weeks    Status  New    Target Date  08/12/19        PT Long Term Goals - 07/29/19 1751  PT LONG TERM GOAL #1   Title  Pt will demonstrate L shoulder ER/IR AROM equivalen to R shoulder    Time  6    Period  Weeks    Status  New    Target Date  09/09/19      PT LONG TERM GOAL #2   Title  Pt will demonstrate ability to reach behind back to ~T10 with reports of no increased L shoulder pain in order to perform functional ADLs    Time  6    Period  Weeks    Status  New    Target Date  09/09/19      PT LONG TERM GOAL #3   Title  Pt will demonstrate ability to reach behind head with no reports of increase in L shoulder pain in order to perform grooming/bathing ADLs    Time  6    Period  Weeks    Status  New    Target Date  09/09/19      PT LONG TERM GOAL #4   Title  Pt will reduce L shoulder pain by 50% in order to return to functional ADLs    Time  6    Period  Weeks    Status  New    Target Date  09/09/19      PT LONG TERM GOAL #5   Title  Pt will demonstrate ability to reach overhead to counter and retrieve 5# objects with no reports of increase in L shoulder pain to return to pain free ADLs    Time  6    Period  Weeks    Status  New    Target Date  09/09/19            Plan - 08/07/19 1527    Clinical Impression Statement  Pt tolerated progression to TE well; required cuing for  form/posture with ex's. Pt scoliosis causing L shoulder elevation >R. STM and stretching to L shoulder and B UT; pt tolerated well. Pt reports main trouble now is reaching behind the back and weakness in L shoulder. Continue to progress next rx.    PT Treatment/Interventions  ADLs/Self Care Home Management;Electrical Stimulation;Ultrasound;Moist Heat;Functional mobility training;Therapeutic activities;Therapeutic exercise;Neuromuscular re-education;Manual techniques;Patient/family education;Passive range of motion;Dry needling    PT Next Visit Plan  Progress L UE strengthening/ROM. Incorporate cervical ROM/flexiblity as indicated. Manual as indicated.    PT Home Exercise Plan  scapular retractions, ER with TB, upper trap stretch    Consulted and Agree with Plan of Care  Patient       Patient will benefit from skilled therapeutic intervention in order to improve the following deficits and impairments:  Decreased range of motion, Impaired UE functional use, Decreased activity tolerance, Pain, Impaired flexibility, Improper body mechanics, Decreased mobility, Decreased strength, Postural dysfunction  Visit Diagnosis: Chronic left shoulder pain  Muscle weakness (generalized)  Decreased range of motion of left shoulder     Problem List Patient Active Problem List   Diagnosis Date Noted  . Bilateral primary osteoarthritis of knee 06/11/2019  . Pain in left shoulder 05/22/2019  . Cellulitis of left upper extremity 10/15/2016  . 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 Diverticulosis of colon without hemorrhage 02/04/2015  . Unspecified vitamin D deficiency 12/25/2013  . UTI (urinary tract infection) 09/29/2011  . 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 Atherosclerosis 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 04/20/2011  . SPINAL STENOSIS, LUMBAR 03/09/2010  . OTHER OSTEOPOROSIS 03/09/2010  .  SCOLIOSIS, LUMBAR SPINE 03/09/2010  . MIGRAINE,  OPHTHALMIC 10/29/2009  . LUMBAR RADICULOPATHY, ATYPICAL 10/29/2009  . POLYNEUROPATHY OTHER DISEASES CLASSIFIED ELSW 09/22/2009  . UNSPECIFIED PERIPHERAL VASCULAR DISEASE 09/22/2009  . ADENOMATOUS COLONIC POLYP 06/10/2009  . History of iron deficiency 05/06/2009  . CAROTID ARTERY DISEASE 07/28/2008  . Allergic rhinitis 03/20/2008  . DYSPHAGIA 03/12/2008  . GANGLION OF TENDON SHEATH 08/17/2007  . Hypothyroidism 11/15/2006  . Hyperlipidemia 11/07/2006  . ADVEF, DRUG/MEDICINAL/BIOLOGICAL SUBST NOS 11/07/2006  . Essential hypertension 10/18/2006  . GERD 10/18/2006  . DIVERTICULOSIS, COLON 10/18/2006   Amador Cunas, PT, DPT [facility] Sh[place]ae Vantol 08/07/2019, 3:31 PM  [place] [address] [place], [place], [address] Phone: [phone]   Fax:  [phone]  Name: Rebecca [name] MRN: [id number] Date of Birth: 12/01/1932

## 2019-08-07 NOTE — Progress Notes (Signed)
Patient ID: Rebecca [name], female   DOB: [birth year], 86 y.o.   MRN: [id number]  This visit type was conducted due to national recommendations for restrictions regarding the COVID-19 pandemic in an effort to limit this patient's exposure and mitigate transmission in our community.   Virtual Visit via Telephone Note  I connected with Rebecca [name] on 08/07/19 at  4:30 PM EDT by telephone and verified that I am speaking with the correct person using two identifiers.   I discussed the limitations, risks, security and privacy concerns of performing an evaluation and management service by telephone and the availability of in person appointments. I also discussed with the patient that there may be a patient responsible charge related to this service. The patient expressed understanding and agreed to proceed.  Location patient: home Location provider: work or home office Participants present for the call: patient, provider Patient did not have a visit in the prior 7 days to address this/these issue(s).   History of Present Illness: Ms. Stehl had called earlier today with concerns over some mild swelling and arm pain at the site of IV which she had yesterday.  She went to the surgical center for spinal injection.  They placed an IV in her right hand near the wrist and today she had some mild swelling and mild tenderness at the site.  She has not noted any tenderness or swelling extending up to the proximal arm or forearm.  Minimal bruising.  Area where IV was a slightly firm to touch.  She has not had any systemic fever.  She also relates her right lateral hip is sore.  She was rolled in position to do the injection and thinks that she may if irritated her hip with that.  She has tried some icing.  Ambulating without much difficulty.  No medial hip pain.  Location is lateral Denies any recent fall or injury  Past Medical History:  Diagnosis Date  . Anemia   . Anxiety   . Diverticulitis   .  Diverticulosis   . Gastroparesis   . GERD (gastroesophageal reflux disease)   . Glaucoma   . Hemorrhoids   . Hyperlipidemia   . Hypertension   . Hypertension   . IBS (irritable bowel syndrome)   . Rectal ulcer   . Segmental colitis ([place])   . Tubular adenoma of colon 05/2009   Past Surgical History:  Procedure Laterality Date  . APPENDECTOMY    . ARCUATE KERATECTOMY    . CATARACT EXTRACTION, BILATERAL  2015  . double pallital tori bone removed     . HERNIA REPAIR     umbilical  . KNEE SURGERY Bilateral   . morton's neuroma on rt foot and platar facial release    . MOUTH SURGERY     "bone shaved off roof of mouth"  . SALIVARY GLAND SURGERY     removal  . TONSILLECTOMY      reports that she has never smoked. She has never used smokeless tobacco. She reports current alcohol use. She reports that she does not use drugs. family history includes Anuerysm in her father; Breast cancer in her daughter; Cancer in her daughter; Colon cancer in her maternal aunt and paternal grandmother; Colon polyps in her brother, daughter, and sister; Diabetes in her brother and mother; Heart disease in her brother, father, and mother; Hyperlipidemia in her brother and mother; Hypertension in her brother, father, and mother; Lung cancer in her sister; Migraines in her daughter. Allergies  Allergen  Reactions  . Levaquin [Levofloxacin] Other (See Comments)    Very nauseous and shakey  . Sulfamethoxazole-Trimethoprim Nausea Only    shakey  . Lactose Intolerance (Gi) Diarrhea and Other (See Comments)    Stomach hurts   . Tramadol Hcl Nausea Only and Palpitations  . Tramadol Hcl Nausea Only and Palpitations  . Augmentin [Amoxicillin-Pot Clavulanate] Diarrhea  . Ciprofloxacin Nausea Only    REACTION: Thrush, Nausea, Shakey  . Pneumococcal Vaccine Polyvalent     REACTION: RED AND RAISED RASH ON ARM  . Adhesive [Tape] Rash  . Codeine Nausea Only      Observations/Objective: Patient sounds cheerful  and well on the phone. I do not appreciate any SOB. Speech and thought processing are grossly intact. Patient reported vitals:  Assessment and Plan:  #1 probable local phlebitis from IV.  -We recommend she try some topical heat.  Consider low-dose baby aspirin for a few days if tolerated well.  Follow-up promptly for any increased swelling or other concerns  #2 right lateral hip pain.  This sounds more like bursa inflammation from description  -Try some icing next few days.  If not improving follow-up in office for further evaluation  Follow Up Instructions:  -As above   [id number] [id number] [id number] 11-20 [id number] 21-30 I did not refer this patient for an OV in the next 24 hours for this/these issue(s).  I discussed the assessment and treatment plan with the patient. The patient was provided an opportunity to ask questions and all were answered. The patient agreed with the plan and demonstrated an understanding of the instructions.   The patient was advised to call back or seek an in-person evaluation if the symptoms worsen or if the condition fails to improve as anticipated.  I provided 18 minutes of non-face-to-face time during this encounter.   Carolann Littler, MD

## 2019-08-07 NOTE — Telephone Encounter (Signed)
Telephone visit set up

## 2019-08-07 NOTE — Telephone Encounter (Signed)
Offer virtual or phone follow up this afternoon

## 2019-08-08 ENCOUNTER — Other Ambulatory Visit: Payer: Self-pay

## 2019-08-09 ENCOUNTER — Ambulatory Visit (INDEPENDENT_AMBULATORY_CARE_PROVIDER_SITE_OTHER): Payer: Medicare Other

## 2019-08-09 ENCOUNTER — Other Ambulatory Visit: Payer: Medicare Other

## 2019-08-09 ENCOUNTER — Ambulatory Visit (INDEPENDENT_AMBULATORY_CARE_PROVIDER_SITE_OTHER): Payer: Medicare Other | Admitting: Family Medicine

## 2019-08-09 ENCOUNTER — Other Ambulatory Visit: Payer: Self-pay

## 2019-08-09 VITALS — BP 120/66 | HR 66 | Temp 97.6°F | Wt 126.0 lb

## 2019-08-09 DIAGNOSIS — M25551 Pain in right hip: Secondary | ICD-10-CM

## 2019-08-09 DIAGNOSIS — M1611 Unilateral primary osteoarthritis, right hip: Secondary | ICD-10-CM | POA: Diagnosis not present

## 2019-08-09 NOTE — Patient Instructions (Signed)
Hip Bursitis  Hip bursitis is inflammation of a fluid-filled sac (bursa) in the hip joint. The bursa prevents the bones in the hip joint from rubbing against each other. Hip bursitis can cause mild to moderate pain, and symptoms often come and go over time. What are the causes? This condition may be caused by:  Injury to the hip.  Overuse of the muscles that surround the hip joint.  Previous injury or surgery of the hip.  Arthritis or gout.  Diabetes.  Thyroid disease.  Infection. In some cases, the cause may not be known. What are the signs or symptoms? Symptoms of this condition include:  Mild or moderate pain in the hip area. Pain may get worse with movement.  Tenderness and swelling of the hip, especially on the outer side of the hip.  In rare cases, the bursa may become infected. This may cause a fever, as well as warmth and redness in the area. Symptoms may come and go. How is this diagnosed? This condition may be diagnosed based on:  A physical exam.  Your medical history.  X-rays.  Removal of fluid from your inflamed bursa for testing (biopsy). You may be sent to a health care provider who specializes in bone diseases (orthopedist) or a provider who specializes in joint inflammation (rheumatologist). How is this treated? This condition is treated by resting, icing, applying pressure (compression), and raising (elevating) the injured area. This is called RICE treatment. In some cases, this may be enough to make your symptoms go away. Treatment may also include:  Using crutches.  Draining fluid out of the bursa to help relieve swelling.  Injecting medicine that helps to reduce inflammation (cortisone).  Additional medicines if the bursa is infected. Follow these instructions at home: Managing pain, stiffness, and swelling   If directed, put ice on the painful area. ? Put ice in a plastic bag. ? Place a towel between your skin and the bag. ? Leave the ice  on for 20 minutes, 2-3 times a day. ? Raise (elevate) your hip above the level of your heart as much as you can without pain. To do this, try putting a pillow under your hips while you lie down. Activity  Return to your normal activities as told by your health care provider. Ask your health care provider what activities are safe for you.  Rest and protect your hip as much as possible until your pain and swelling get better. General instructions  Take over-the-counter and prescription medicines only as told by your health care provider.  Wear compression wraps only as told by your health care provider.  Do not use your hip to support your body weight until your health care provider says that you can. Use crutches as told by your health care provider.  Gently massage and stretch your injured area as often as is comfortable.  Keep all follow-up visits as told by your health care provider. This is important. How is this prevented?  Exercise regularly, as told by your health care provider.  Warm up and stretch before being active.  Cool down and stretch after being active.  If an activity irritates your hip or causes pain, avoid the activity as much as possible.  Avoid sitting down for long periods at a time. Contact a health care provider if you:  Have a fever.  Develop new symptoms.  Have difficulty walking or doing everyday activities.  Have pain that gets worse or does not get better with medicine.    Develop red skin or a feeling of warmth in your hip area. Get help right away if you:  Cannot move your hip.  Have severe pain. Summary  Hip bursitis is inflammation of a fluid-filled sac (bursa) in the hip joint.  Hip bursitis can cause mild to moderate pain, and symptoms often come and go over time.  This condition is treated with rest, ice, compression, elevation, and medicines. This information is not intended to replace advice given to you by your health care  provider. Make sure you discuss any questions you have with your health care provider. Document Revised: 11/27/2017 Document Reviewed: 11/27/2017 Elsevier Patient Education  2020 [employer]  

## 2019-08-09 NOTE — Progress Notes (Signed)
Subjective:     Patient ID: Rebecca [name], female   DOB: [birth year], 86 y.o.   MRN: [id number]  HPI   Rebecca [name] had called couple days ago with concerns for some right wrist swelling and mild pain following IV.  She had steroid injection in her back on Tuesday.  She states that she was given some sedation and in the process of positioning her for the steroid injection she thinks her right hip might have been injured.  She had some right lateral hip pain since then.  There was no fall.  She does have some pain with ambulation.  She has not noted any bruising.  Denies any lower extremity weakness.  Her right wrist is improved.  She has some mild local bruising.  She was partially sedated with the procedure so she thinks she might not have been cognizant of any injury at the time.  Past Medical History:  Diagnosis Date  . Anemia   . Anxiety   . Diverticulitis   . Diverticulosis   . Gastroparesis   . GERD (gastroesophageal reflux disease)   . Glaucoma   . Hemorrhoids   . Hyperlipidemia   . Hypertension   . Hypertension   . IBS (irritable bowel syndrome)   . Rectal ulcer   . Segmental colitis ([place])   . Tubular adenoma of colon 05/2009   Past Surgical History:  Procedure Laterality Date  . APPENDECTOMY    . ARCUATE KERATECTOMY    . CATARACT EXTRACTION, BILATERAL  2015  . double pallital tori bone removed     . HERNIA REPAIR     umbilical  . KNEE SURGERY Bilateral   . morton's neuroma on rt foot and platar facial release    . MOUTH SURGERY     "bone shaved off roof of mouth"  . SALIVARY GLAND SURGERY     removal  . TONSILLECTOMY      reports that she has never smoked. She has never used smokeless tobacco. She reports current alcohol use. She reports that she does not use drugs. family history includes Anuerysm in her father; Breast cancer in her daughter; Cancer in her daughter; Colon cancer in her maternal aunt and paternal grandmother; Colon polyps in her brother, daughter, and  sister; Diabetes in her brother and mother; Heart disease in her brother, father, and mother; Hyperlipidemia in her brother and mother; Hypertension in her brother, father, and mother; Lung cancer in her sister; Migraines in her daughter. Allergies  Allergen Reactions  . Levaquin [Levofloxacin] Other (See Comments)    Very nauseous and shakey  . Sulfamethoxazole-Trimethoprim Nausea Only    shakey  . Lactose Intolerance (Gi) Diarrhea and Other (See Comments)    Stomach hurts   . Tramadol Hcl Nausea Only and Palpitations  . Tramadol Hcl Nausea Only and Palpitations  . Augmentin [Amoxicillin-Pot Clavulanate] Diarrhea  . Ciprofloxacin Nausea Only    REACTION: Thrush, Nausea, Shakey  . Pneumococcal Vaccine Polyvalent     REACTION: RED AND RAISED RASH ON ARM  . Adhesive [Tape] Rash  . Codeine Nausea Only     Review of Systems  Respiratory: Negative for shortness of breath.   Cardiovascular: Negative for chest pain.  Neurological: Negative for weakness and numbness.       Objective:   Physical Exam Vitals reviewed.  Constitutional:      Appearance: Normal appearance.  Cardiovascular:     Rate and Rhythm: Normal rate and regular rhythm.  Musculoskeletal:     Comments:  Right wrist reveals some bruising over the radial aspect of the wrist laterally.  There is no evidence of any palpable phlebitis in terms of any firmness to palpation or any redness.  Right hip reveals good range of motion.  She does have some tenderness over the greater trochanteric bursa region.  Neurological:     Mental Status: She is alert.        Assessment:     Right lateral hip pain following positioning for steroid injection of the back.  Suspect most likely she has some acute inflammation of the bursa.  Patient is concerned about possible fracture.  We explained that unless she had fracture involving greater trochanter would be very unlikely based on her exam (ie excellent ROM hip with no pain, no  groin/medial hip pain, etc)    Plan:     -Go ahead and check x-rays of the right hip. -If negative, focus on icing and some topical Voltaren gel which she already has at home. -We explained that steroid injections are very effective frequently for treating trochanteric bursitis but she is reluctant to use any further steroids at this time with her recent back injection  [name] MD [facility]

## 2019-08-12 ENCOUNTER — Ambulatory Visit: Payer: Medicare Other | Admitting: Physical Therapy

## 2019-08-12 ENCOUNTER — Other Ambulatory Visit: Payer: Self-pay

## 2019-08-12 ENCOUNTER — Encounter: Payer: Self-pay | Admitting: Physical Therapy

## 2019-08-12 DIAGNOSIS — M25512 Pain in left shoulder: Secondary | ICD-10-CM | POA: Diagnosis not present

## 2019-08-12 DIAGNOSIS — G8929 Other chronic pain: Secondary | ICD-10-CM

## 2019-08-12 DIAGNOSIS — M25612 Stiffness of left shoulder, not elsewhere classified: Secondary | ICD-10-CM

## 2019-08-12 DIAGNOSIS — M6281 Muscle weakness (generalized): Secondary | ICD-10-CM | POA: Diagnosis not present

## 2019-08-12 NOTE — Therapy (Signed)
[address] [place], [place], [address] Phone: [phone]   Fax:  [phone]  Physical Therapy Treatment  Patient Details  Name: Rebecca [name] MRN: [id number] Date of Birth: [birth year] Referring Provider (PT): Joni Fears   Encounter Date: 08/12/2019  PT End of Session - 08/12/19 1342    Visit Number  3    Date for PT Re-Evaluation  09/28/19    PT Start Time  1300    PT Stop Time  1343    PT Time Calculation (min)  43 min    Activity Tolerance  Patient tolerated treatment well    Behavior During Therapy  [facility] for tasks assessed/performed       Past Medical History:  Diagnosis Date  . Anemia   . Anxiety   . Diverticulitis   . Diverticulosis   . Gastroparesis   . GERD (gastroesophageal reflux disease)   . Glaucoma   . Hemorrhoids   . Hyperlipidemia   . Hypertension   . Hypertension   . IBS (irritable bowel syndrome)   . Rectal ulcer   . Segmental colitis ([place])   . Tubular adenoma of colon 05/2009    Past Surgical History:  Procedure Laterality Date  . APPENDECTOMY    . ARCUATE KERATECTOMY    . CATARACT EXTRACTION, BILATERAL  2015  . double pallital tori bone removed     . HERNIA REPAIR     umbilical  . KNEE SURGERY Bilateral   . morton's neuroma on rt foot and platar facial release    . MOUTH SURGERY     "bone shaved off roof of mouth"  . SALIVARY GLAND SURGERY     removal  . TONSILLECTOMY      There were no vitals filed for this visit.  Subjective Assessment - 08/12/19 1301    Subjective  Pt reports that she feels like her shoulder is doing a lot better. She stated she put a sweater on yesterday normally    Currently in Pain?  No/denies                       [facility] Adult PT Treatment/Exercise - 08/12/19 0001      Neck Exercises: Machines for Strengthening   UBE (Upper Arm Bike)  43mn fwd/322m bkwd       Neck Exercises: Theraband   Shoulder External  Rotation  20 reps;Red    Shoulder External Rotation Limitations  cues for form      Shoulder Exercises: Standing   Flexion  Both;20 reps;Weights    Shoulder Flexion Weight (lbs)  1    ABduction  Strengthening;Weights;20 reps;Both    Extension  Theraband;20 reps;Both;Strengthening    Theraband Level (Shoulder Extension)  Level 2 (Red)    Row  Theraband;20 reps;Both;Strengthening    Theraband Level (Shoulder Row)  Level 2 (Red)      Shoulder Exercises: ROM/Strengthening   Other ROM/Strengthening Exercises  Rows 15lb 2x10    Other ROM/Strengthening Exercises  Lats 10lb 2x10       Manual Therapy   Manual Therapy  Soft tissue mobilization;Passive ROM    Soft tissue mobilization  STM to B upper trap with stretch     Passive ROM  PROM with focus on L shoulder ER and MET               PT Short Term Goals - 07/29/19 1751      PT SHORT  TERM GOAL #1   Title  Pt will be independent with HEP    Time  2    Period  Weeks    Status  New    Target Date  08/12/19        PT Long Term Goals - 07/29/19 1751      PT LONG TERM GOAL #1   Title  Pt will demonstrate L shoulder ER/IR AROM equivalen to R shoulder    Time  6    Period  Weeks    Status  New    Target Date  09/09/19      PT LONG TERM GOAL #2   Title  Pt will demonstrate ability to reach behind back to ~T10 with reports of no increased L shoulder pain in order to perform functional ADLs    Time  6    Period  Weeks    Status  New    Target Date  09/09/19      PT LONG TERM GOAL #3   Title  Pt will demonstrate ability to reach behind head with no reports of increase in L shoulder pain in order to perform grooming/bathing ADLs    Time  6    Period  Weeks    Status  New    Target Date  09/09/19      PT LONG TERM GOAL #4   Title  Pt will reduce L shoulder pain by 50% in order to return to functional ADLs    Time  6    Period  Weeks    Status  New    Target Date  09/09/19      PT LONG TERM GOAL #5   Title  Pt will  demonstrate ability to reach overhead to counter and retrieve 5# objects with no reports of increase in L shoulder pain to return to pain free ADLs    Time  6    Period  Weeks    Status  New    Target Date  09/09/19            Plan - 08/12/19 1343    Clinical Impression Statement  Pt did well in today's therapy session. Tactile cues to prevent postural sway with seated rows. No increase in shoulder elevation noted with flexion and abduction. MT with a focuses on ER/IR due to tightness with the motions IR>ER    Personal Factors and Comorbidities  Age;Comorbidity 1;Fitness;Sex;Time since onset of injury/illness/exacerbation    Examination-Activity Limitations  Bathing;Carry;Dressing;Hygiene/Grooming;Lift;Sleep;Reach Overhead    Examination-Participation Restrictions  Cleaning;Laundry;Meal Prep    Stability/Clinical Decision Making  Stable/Uncomplicated    Rehab Potential  Good    PT Frequency  2x / week    PT Duration  6 weeks    PT Treatment/Interventions  ADLs/Self Care Home Management;Electrical Stimulation;Ultrasound;Moist Heat;Functional mobility training;Therapeutic activities;Therapeutic exercise;Neuromuscular re-education;Manual techniques;Patient/family education;Passive range of motion;Dry needling    PT Next Visit Plan  Progress L UE strengthening/ROM. Incorporate cervical ROM/flexiblity as indicated. Manual as indicated.       Patient will benefit from skilled therapeutic intervention in order to improve the following deficits and impairments:  Decreased range of motion, Impaired UE functional use, Decreased activity tolerance, Pain, Impaired flexibility, Improper body mechanics, Decreased mobility, Decreased strength, Postural dysfunction  Visit Diagnosis: Chronic left shoulder pain  Decreased range of motion of left shoulder  Muscle weakness (generalized)     Problem List Patient Active Problem List   Diagnosis Date Noted  . Bilateral primary osteoarthritis  of  knee 06/11/2019  . Pain in left shoulder 05/22/2019  . Cellulitis of left upper extremity 10/15/2016  . Segmental colitis without complication ([place]) 75/88/3254  . Diverticulosis of colon without hemorrhage 02/04/2015  . Unspecified vitamin D deficiency 12/25/2013  . UTI (urinary tract infection) 09/29/2011  . 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 Atherosclerosis 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 04/20/2011  . SPINAL STENOSIS, LUMBAR 03/09/2010  . OTHER OSTEOPOROSIS 03/09/2010  . SCOLIOSIS, LUMBAR SPINE 03/09/2010  . MIGRAINE, OPHTHALMIC 10/29/2009  . LUMBAR RADICULOPATHY, ATYPICAL 10/29/2009  . POLYNEUROPATHY OTHER DISEASES CLASSIFIED ELSW 09/22/2009  . UNSPECIFIED PERIPHERAL VASCULAR DISEASE 09/22/2009  . ADENOMATOUS COLONIC POLYP 06/10/2009  . History of iron deficiency 05/06/2009  . CAROTID ARTERY DISEASE 07/28/2008  . Allergic rhinitis 03/20/2008  . DYSPHAGIA 03/12/2008  . GANGLION OF TENDON SHEATH 08/17/2007  . Hypothyroidism 11/15/2006  . Hyperlipidemia 11/07/2006  . ADVEF, DRUG/MEDICINAL/BIOLOGICAL SUBST NOS 11/07/2006  . Essential hypertension 10/18/2006  . GERD 10/18/2006  . DIVERTICULOSIS, COLON 10/18/2006    Scot Jun, PTA 08/12/2019, 1:46 PM  [address] [place], [place], [address] Phone: [phone]   Fax:  [phone]  Name: Rebecca [name] MRN: [id number] Date of Birth: 1933-01-20

## 2019-08-15 ENCOUNTER — Other Ambulatory Visit: Payer: Self-pay

## 2019-08-15 ENCOUNTER — Ambulatory Visit: Payer: Medicare Other | Admitting: Physical Therapy

## 2019-08-15 ENCOUNTER — Encounter: Payer: Self-pay | Admitting: Physical Therapy

## 2019-08-15 DIAGNOSIS — M25612 Stiffness of left shoulder, not elsewhere classified: Secondary | ICD-10-CM | POA: Diagnosis not present

## 2019-08-15 DIAGNOSIS — M25512 Pain in left shoulder: Secondary | ICD-10-CM

## 2019-08-15 DIAGNOSIS — G8929 Other chronic pain: Secondary | ICD-10-CM

## 2019-08-15 DIAGNOSIS — M6281 Muscle weakness (generalized): Secondary | ICD-10-CM | POA: Diagnosis not present

## 2019-08-15 NOTE — Therapy (Signed)
[address] [place], [place], [address] Phone: [phone]   Fax:  [phone]  Physical Therapy Treatment  Patient Details  Name: Rebecca [name] MRN: [id number] Date of Birth: [birth year] Referring Provider (PT): Joni Fears   Encounter Date: 08/15/2019  PT End of Session - 08/15/19 1340    Visit Number  4    Date for PT Re-Evaluation  09/28/19    PT Start Time  1300    PT Stop Time  1340    PT Time Calculation (min)  40 min    Activity Tolerance  Patient tolerated treatment well    Behavior During Therapy  [facility] for tasks assessed/performed       Past Medical History:  Diagnosis Date  . Anemia   . Anxiety   . Diverticulitis   . Diverticulosis   . Gastroparesis   . GERD (gastroesophageal reflux disease)   . Glaucoma   . Hemorrhoids   . Hyperlipidemia   . Hypertension   . Hypertension   . IBS (irritable bowel syndrome)   . Rectal ulcer   . Segmental colitis ([place])   . Tubular adenoma of colon 05/2009    Past Surgical History:  Procedure Laterality Date  . APPENDECTOMY    . ARCUATE KERATECTOMY    . CATARACT EXTRACTION, BILATERAL  2015  . double pallital tori bone removed     . HERNIA REPAIR     umbilical  . KNEE SURGERY Bilateral   . morton's neuroma on rt foot and platar facial release    . MOUTH SURGERY     "bone shaved off roof of mouth"  . SALIVARY GLAND SURGERY     removal  . TONSILLECTOMY      There were no vitals filed for this visit.  Subjective Assessment - 08/15/19 1303    Subjective  "I am good" L arm is a little sore    Currently in Pain?  No/denies    Pain Location  Shoulder    Pain Orientation  Left    Pain Descriptors / Indicators  Sore                        [facility] Adult PT Treatment/Exercise - 08/15/19 0001      Neck Exercises: Machines for Strengthening   UBE (Upper Arm Bike)  68mn fwd/3109m bkwd       Shoulder Exercises: Supine    External Rotation  Left;5 reps;Strengthening;Weights    External Rotation Weight (lbs)  2    Internal Rotation  Strengthening;Left;5 reps;Weights    Internal Rotation Weight (lbs)  2      Shoulder Exercises: Standing   External Rotation  Strengthening;Left;Theraband;20 reps    Theraband Level (Shoulder External Rotation)  Level 1 (Yellow)    Internal Rotation  Strengthening;Theraband;20 reps;Left    Theraband Level (Shoulder Internal Rotation)  Level 1 (Yellow)    Flexion  Both;20 reps;Weights    Extension  Theraband;20 reps;Both;Strengthening    Theraband Level (Shoulder Extension)  Level 2 (Red)    Row  Theraband;20 reps;Both;Strengthening    Theraband Level (Shoulder Row)  Level 3 (Green)    Other Standing Exercises  LUE counter top to 2nd level x5, Overhead reaches red ball 2x10     Other Standing Exercises  triceps ext 10lb 2x10                PT Short Term Goals - 07/29/19 1751  PT SHORT TERM GOAL #1   Title  Pt will be independent with HEP    Time  2    Period  Weeks    Status  New    Target Date  08/12/19        PT Long Term Goals - 08/15/19 1310      PT LONG TERM GOAL #1   Title  Pt will demonstrate L shoulder ER/IR AROM equivalen to R shoulder    Status  Achieved      PT LONG TERM GOAL #3   Title  Pt will demonstrate ability to reach behind head with no reports of increase in L shoulder pain in order to perform grooming/bathing ADLs    Status  Achieved      PT LONG TERM GOAL #4   Title  Pt will reduce L shoulder pain by 50% in order to return to functional ADLs    Status  Partially Met      PT LONG TERM GOAL #5   Title  Pt will demonstrate ability to reach overhead to counter and retrieve 5# objects with no reports of increase in L shoulder pain to return to pain free ADLs    Status  Partially Met   3lb           Plan - 08/15/19 1341    Clinical Impression Statement  Pt able to demonstrate the ability to reach the back of her head with  the LUE, she also demos equal shoulder internal and external rotation meeting goals. She reports improvement at home with ADLs. She reports 3lb cabinet reaches was challenging. All other interventions completed well.    Personal Factors and Comorbidities  Age;Comorbidity 1;Fitness;Sex;Time since onset of injury/illness/exacerbation    Comorbidities  HTN    Examination-Activity Limitations  Bathing;Carry;Dressing;Hygiene/Grooming;Lift;Sleep;Reach Overhead    Examination-Participation Restrictions  Cleaning;Laundry;Meal Prep    Stability/Clinical Decision Making  Stable/Uncomplicated    Rehab Potential  Good    PT Frequency  2x / week    PT Treatment/Interventions  ADLs/Self Care Home Management;Electrical Stimulation;Ultrasound;Moist Heat;Functional mobility training;Therapeutic activities;Therapeutic exercise;Neuromuscular re-education;Manual techniques;Patient/family education;Passive range of motion;Dry needling    PT Next Visit Plan  Progress L UE strengthening/ROM. Incorporate cervical ROM/flexiblity as indicated. Manual as indicated.       Patient will benefit from skilled therapeutic intervention in order to improve the following deficits and impairments:  Decreased range of motion, Impaired UE functional use, Decreased activity tolerance, Pain, Impaired flexibility, Improper body mechanics, Decreased mobility, Decreased strength, Postural dysfunction  Visit Diagnosis: Chronic left shoulder pain  Decreased range of motion of left shoulder  Muscle weakness (generalized)     Problem List Patient Active Problem List   Diagnosis Date Noted  . Bilateral primary osteoarthritis of knee 06/11/2019  . Pain in left shoulder 05/22/2019  . Cellulitis of left upper extremity 10/15/2016  . Segmental colitis without complication ([place]) 82/95/6213  . Diverticulosis of colon without hemorrhage 02/04/2015  . Unspecified vitamin D deficiency 12/25/2013  . UTI (urinary tract infection) 09/29/2011   . 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 Atherosclerosis 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 04/20/2011  . SPINAL STENOSIS, LUMBAR 03/09/2010  . OTHER OSTEOPOROSIS 03/09/2010  . SCOLIOSIS, LUMBAR SPINE 03/09/2010  . MIGRAINE, OPHTHALMIC 10/29/2009  . LUMBAR RADICULOPATHY, ATYPICAL 10/29/2009  . POLYNEUROPATHY OTHER DISEASES CLASSIFIED ELSW 09/22/2009  . UNSPECIFIED PERIPHERAL VASCULAR DISEASE 09/22/2009  . ADENOMATOUS COLONIC POLYP 06/10/2009  . History of iron deficiency 05/06/2009  . CAROTID ARTERY DISEASE  07/28/2008  . Allergic rhinitis 03/20/2008  . DYSPHAGIA 03/12/2008  . GANGLION OF TENDON SHEATH 08/17/2007  . Hypothyroidism 11/15/2006  . Hyperlipidemia 11/07/2006  . ADVEF, DRUG/MEDICINAL/BIOLOGICAL SUBST NOS 11/07/2006  . Essential hypertension 10/18/2006  . GERD 10/18/2006  . DIVERTICULOSIS, COLON 10/18/2006    Scot Jun 08/15/2019, 1:43 PM  [address] [place], [place], [address] Phone: [phone]   Fax:  [phone]  Name: Rebecca [name] MRN: [id number] Date of Birth: Aug 24, 1932

## 2019-08-16 ENCOUNTER — Ambulatory Visit (INDEPENDENT_AMBULATORY_CARE_PROVIDER_SITE_OTHER): Payer: Medicare Other | Admitting: Family Medicine

## 2019-08-16 ENCOUNTER — Encounter: Payer: Self-pay | Admitting: Family Medicine

## 2019-08-16 VITALS — BP 138/82 | HR 61 | Temp 97.7°F | Wt 126.8 lb

## 2019-08-16 DIAGNOSIS — I10 Essential (primary) hypertension: Secondary | ICD-10-CM

## 2019-08-16 DIAGNOSIS — E038 Other specified hypothyroidism: Secondary | ICD-10-CM | POA: Diagnosis not present

## 2019-08-16 DIAGNOSIS — M25551 Pain in right hip: Secondary | ICD-10-CM | POA: Diagnosis not present

## 2019-08-16 DIAGNOSIS — E785 Hyperlipidemia, unspecified: Secondary | ICD-10-CM

## 2019-08-16 LAB — TSH: TSH: 2.24 u[IU]/mL (ref 0.35–4.50)

## 2019-08-16 NOTE — Progress Notes (Signed)
Subjective:     Patient ID: Rebecca [name], female   DOB: 11-16-1932, 86 y.o.   MRN: [id number]  HPI   [name] is seen for the following issues  Chronic problems are good hypertension, hypothyroidism, hyperlipidemia.  She had been seen recently for right hip pain following some repositioning for injection of her back.  X-rays were reviewed and showed degenerative changes but no fracture.  She has severe degenerative spondylosis of the lumbar spine.  Her back continues being her main problem as opposed to the hip.  She does have some stiffness and pain in her hips with ambulation but not restricting walking at this time  Because of cost issues she had switched from brand name Synthroid which she taken for years to generic back in February.  We discussed getting follow-up TSH at this time.  No symptoms of overt hypothyroidism  She has hypertension which is treated with losartan.  No recent dizziness.  No headaches.  No chest pains.  She takes simvastatin for hyperlipidemia.  We reviewed lipids from last fall and these were stable.  No significant myalgias  Past Medical History:  Diagnosis Date  . Anemia   . Anxiety   . Diverticulitis   . Diverticulosis   . Gastroparesis   . GERD (gastroesophageal reflux disease)   . Glaucoma   . Hemorrhoids   . Hyperlipidemia   . Hypertension   . Hypertension   . IBS (irritable bowel syndrome)   . Rectal ulcer   . Segmental colitis ([place])   . Tubular adenoma of colon 05/2009   Past Surgical History:  Procedure Laterality Date  . APPENDECTOMY    . ARCUATE KERATECTOMY    . CATARACT EXTRACTION, BILATERAL  2015  . double pallital tori bone removed     . HERNIA REPAIR     umbilical  . KNEE SURGERY Bilateral   . morton's neuroma on rt foot and platar facial release    . MOUTH SURGERY     "bone shaved off roof of mouth"  . SALIVARY GLAND SURGERY     removal  . TONSILLECTOMY      reports that she has never smoked. She has never used smokeless  tobacco. She reports current alcohol use. She reports that she does not use drugs. family history includes Anuerysm in her father; Breast cancer in her daughter; Cancer in her daughter; Colon cancer in her maternal aunt and paternal grandmother; Colon polyps in her brother, daughter, and sister; Diabetes in her brother and mother; Heart disease in her brother, father, and mother; Hyperlipidemia in her brother and mother; Hypertension in her brother, father, and mother; Lung cancer in her sister; Migraines in her daughter. Allergies  Allergen Reactions  . Levaquin [Levofloxacin] Other (See Comments)    Very nauseous and shakey  . Sulfamethoxazole-Trimethoprim Nausea Only    shakey  . Lactose Intolerance (Gi) Diarrhea and Other (See Comments)    Stomach hurts   . Tramadol Hcl Nausea Only and Palpitations  . Tramadol Hcl Nausea Only and Palpitations  . Augmentin [Amoxicillin-Pot Clavulanate] Diarrhea  . Ciprofloxacin Nausea Only    REACTION: Thrush, Nausea, Shakey  . Pneumococcal Vaccine Polyvalent     REACTION: RED AND RAISED RASH ON ARM  . Adhesive [Tape] Rash  . Codeine Nausea Only     Review of Systems  Constitutional: Negative for fatigue.  Eyes: Negative for visual disturbance.  Respiratory: Negative for cough, chest tightness, shortness of breath and wheezing.   Cardiovascular: Negative for chest  pain, palpitations and leg swelling.  Gastrointestinal: Negative for abdominal pain.  Endocrine: Negative for polydipsia and polyuria.  Musculoskeletal: Positive for arthralgias and back pain.  Neurological: Negative for dizziness, seizures, syncope, weakness, light-headedness and headaches.       Objective:   Physical Exam Vitals reviewed.  Constitutional:      Appearance: Normal appearance.  Cardiovascular:     Rate and Rhythm: Normal rate and regular rhythm.  Pulmonary:     Effort: Pulmonary effort is normal.     Breath sounds: Normal breath sounds.  Musculoskeletal:      Right lower leg: No edema.     Left lower leg: No edema.  Neurological:     Mental Status: She is alert.        Assessment:     #1 hypothyroidism.  Recent change from brand name to generic levothyroxine  #2 hypertension stable  #3 hyperlipidemia treated with simvastatin.  Lipids were to goal last fall.  #4 right hip pain-improved.  Reviewed recent x-rays which show degenerative changes but no fracture    Plan:     -Continue regular ambulation as tolerated -Recheck TSH today -Continue current medications -Routine follow-up in 6 months.  Recheck lipid, hepatic, and basic metabolic panel then  [name] MD [facility]

## 2019-08-19 ENCOUNTER — Encounter: Payer: Self-pay | Admitting: Physical Therapy

## 2019-08-19 ENCOUNTER — Ambulatory Visit: Payer: Medicare Other | Admitting: Physical Therapy

## 2019-08-19 ENCOUNTER — Other Ambulatory Visit: Payer: Self-pay

## 2019-08-19 DIAGNOSIS — M6281 Muscle weakness (generalized): Secondary | ICD-10-CM | POA: Diagnosis not present

## 2019-08-19 DIAGNOSIS — M25612 Stiffness of left shoulder, not elsewhere classified: Secondary | ICD-10-CM | POA: Diagnosis not present

## 2019-08-19 DIAGNOSIS — M25512 Pain in left shoulder: Secondary | ICD-10-CM | POA: Diagnosis not present

## 2019-08-19 DIAGNOSIS — G8929 Other chronic pain: Secondary | ICD-10-CM | POA: Diagnosis not present

## 2019-08-19 NOTE — Therapy (Signed)
[address] [place], [place], [address] Phone: [phone]   Fax:  [phone]  Physical Therapy Treatment  Patient Details  Name: Rebecca [name] MRN: [id number] Date of Birth: 01-16-1933 Referring Provider (PT): Joni Fears   Encounter Date: 08/19/2019  PT End of Session - 08/19/19 1402    Visit Number  5    Date for PT Re-Evaluation  09/28/19    PT Start Time  1315    PT Stop Time  1400    PT Time Calculation (min)  45 min    Activity Tolerance  Patient tolerated treatment well    Behavior During Therapy  [facility] for tasks assessed/performed       Past Medical History:  Diagnosis Date  . Anemia   . Anxiety   . Diverticulitis   . Diverticulosis   . Gastroparesis   . GERD (gastroesophageal reflux disease)   . Glaucoma   . Hemorrhoids   . Hyperlipidemia   . Hypertension   . Hypertension   . IBS (irritable bowel syndrome)   . Rectal ulcer   . Segmental colitis ([place])   . Tubular adenoma of colon 05/2009    Past Surgical History:  Procedure Laterality Date  . APPENDECTOMY    . ARCUATE KERATECTOMY    . CATARACT EXTRACTION, BILATERAL  2015  . double pallital tori bone removed     . HERNIA REPAIR     umbilical  . KNEE SURGERY Bilateral   . morton's neuroma on rt foot and platar facial release    . MOUTH SURGERY     "bone shaved off roof of mouth"  . SALIVARY GLAND SURGERY     removal  . TONSILLECTOMY      There were no vitals filed for this visit.  Subjective Assessment - 08/19/19 1331    Subjective  Pt reports arm is feeling much better and she would like to d/c and continue ex's at home after next rx.    Currently in Pain?  No/denies    Pain Score  0-No pain                        [facility] Adult PT Treatment/Exercise - 08/19/19 0001      Neck Exercises: Machines for Strengthening   UBE (Upper Arm Bike)  89mn fwd/354m bkwd       Neck Exercises: Theraband   Shoulder External Rotation  10 reps;Green    Shoulder Internal Rotation  10 reps   yellow     Neck Exercises: Seated   Shoulder Flexion  Both;10 reps;Weights    Shoulder Flexion Weights (lbs)  2    Shoulder ABduction  Both;10 reps    Shoulder Abduction Weights (lbs)  2      Shoulder Exercises: Standing   Extension  Strengthening;Both;10 reps    Extension Weight (lbs)  5#    Other Standing Exercises  LUE to 2nd shelf 4# 2x5    Other Standing Exercises  triceps ext 10lb 2x10       Shoulder Exercises: ROM/Strengthening   Ball on Wall  x5    Other ROM/Strengthening Exercises  Rows and Lats 15# 2x10    Other ROM/Strengthening Exercises  Cabinet taps 2# x10, 3# x 10 shoulder flex and abduction               PT Short Term Goals - 07/29/19 1751      PT SHORT  TERM GOAL #1   Title  Pt will be independent with HEP    Time  2    Period  Weeks    Status  New    Target Date  08/12/19        PT Long Term Goals - 08/15/19 1310      PT LONG TERM GOAL #1   Title  Pt will demonstrate L shoulder ER/IR AROM equivalen to R shoulder    Status  Achieved      PT LONG TERM GOAL #3   Title  Pt will demonstrate ability to reach behind head with no reports of increase in L shoulder pain in order to perform grooming/bathing ADLs    Status  Achieved      PT LONG TERM GOAL #4   Title  Pt will reduce L shoulder pain by 50% in order to return to functional ADLs    Status  Partially Met      PT LONG TERM GOAL #5   Title  Pt will demonstrate ability to reach overhead to counter and retrieve 5# objects with no reports of increase in L shoulder pain to return to pain free ADLs    Status  Partially Met   3lb           Plan - 08/19/19 1403    Clinical Impression Statement  Pt has made good progress; able to perform all ex's today with no reports of increased pain. Pt states that she is able to perform all ADLs at home with no trouble. Pt would like to continue ex's at home after this  week; anticipate d/c next rx.    PT Treatment/Interventions  ADLs/Self Care Home Management;Electrical Stimulation;Ultrasound;Moist Heat;Functional mobility training;Therapeutic activities;Therapeutic exercise;Neuromuscular re-education;Manual techniques;Patient/family education;Passive range of motion;Dry needling    PT Next Visit Plan  Progress L UE strengthening/ROM. Incorporate cervical ROM/flexiblity as indicated. Manual as indicated. Potential d/c next rx.    PT Home Exercise Plan  scapular retractions, ER with TB, upper trap stretch    Consulted and Agree with Plan of Care  Patient       Patient will benefit from skilled therapeutic intervention in order to improve the following deficits and impairments:  Decreased range of motion, Impaired UE functional use, Decreased activity tolerance, Pain, Impaired flexibility, Improper body mechanics, Decreased mobility, Decreased strength, Postural dysfunction  Visit Diagnosis: Chronic left shoulder pain  Decreased range of motion of left shoulder  Muscle weakness (generalized)     Problem List Patient Active Problem List   Diagnosis Date Noted  . Bilateral primary osteoarthritis of knee 06/11/2019  . Pain in left shoulder 05/22/2019  . Cellulitis of left upper extremity 10/15/2016  . 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 Diverticulosis of colon without hemorrhage 02/04/2015  . Unspecified vitamin D deficiency 12/25/2013  . UTI (urinary tract infection) 09/29/2011  . 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 Atherosclerosis 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 04/20/2011  . SPINAL STENOSIS, LUMBAR 03/09/2010  . OTHER OSTEOPOROSIS 03/09/2010  . SCOLIOSIS, LUMBAR SPINE 03/09/2010  . MIGRAINE, OPHTHALMIC 10/29/2009  . LUMBAR RADICULOPATHY, ATYPICAL 10/29/2009  . POLYNEUROPATHY OTHER DISEASES CLASSIFIED ELSW 09/22/2009  . UNSPECIFIED PERIPHERAL VASCULAR DISEASE 09/22/2009  . ADENOMATOUS  COLONIC POLYP 06/10/2009  . History of iron deficiency 05/06/2009  . CAROTID ARTERY DISEASE 07/28/2008  . Allergic rhinitis 03/20/2008  . DYSPHAGIA 03/12/2008  . GANGLION OF TENDON SHEATH 08/17/2007  . Hypothyroidism 11/15/2006  . Hyperlipidemia 11/07/2006  . ADVEF, DRUG/MEDICINAL/BIOLOGICAL  SUBST NOS 11/07/2006  . Essential hypertension 10/18/2006  . GERD 10/18/2006  . DIVERTICULOSIS, COLON 10/18/2006   Amador Cunas, PT, DPT Donald Prose [place]et 08/19/2019, 2:05 PM  [address] [place], [place], [address] Phone: [phone]   Fax:  [phone]  Name: Rebecca [name] MRN: [id number] Date of Birth: 1933/02/12

## 2019-08-21 ENCOUNTER — Ambulatory Visit: Payer: Medicare Other | Admitting: Physical Therapy

## 2019-08-21 ENCOUNTER — Encounter: Payer: Self-pay | Admitting: Physical Therapy

## 2019-08-21 ENCOUNTER — Other Ambulatory Visit: Payer: Self-pay

## 2019-08-21 DIAGNOSIS — M25512 Pain in left shoulder: Secondary | ICD-10-CM

## 2019-08-21 DIAGNOSIS — M6281 Muscle weakness (generalized): Secondary | ICD-10-CM

## 2019-08-21 DIAGNOSIS — G8929 Other chronic pain: Secondary | ICD-10-CM

## 2019-08-21 DIAGNOSIS — M25612 Stiffness of left shoulder, not elsewhere classified: Secondary | ICD-10-CM | POA: Diagnosis not present

## 2019-08-21 NOTE — Patient Instructions (Signed)
Access Code: [email or web address] URL: [email or web address] Date: 08/21/2019 Prepared by: Amador Cunas  Exercises Standing Shoulder External Rotation Stretch in Doorway - 1 x daily - 5 x weekly - 3 sets - 2 reps - 30 sec hold Standing Shoulder Internal Rotation Stretch Behind Back - 1 x daily - 5 x weekly - 3 sets - 2 reps - 30 sec hold Doorway Pec Stretch at 90 Degrees Abduction - 1 x daily - 5 x weekly - 3 sets - 2 reps - 30 sec hold Shoulder Flexion Wall Slide with Towel - 1 x daily - 5 x weekly - 3 sets - 10 reps Standing Shoulder Abduction Slides at Wall - 1 x daily - 5 x weekly - 3 sets - 10 reps

## 2019-08-21 NOTE — Therapy (Signed)
[place], [place], [address] Phone: [phone]   Fax:  [phone]  Physical Therapy Treatment PHYSICAL THERAPY DISCHARGE SUMMARY  Plan: Patient agrees to discharge.  Patient goals were met. Patient is being discharged due to being pleased with the current functional level.  ?????      Patient Details  Name: Rebecca [name] MRN: [id number] Date of Birth: 04-28-1932 Referring Provider (PT): Joni Fears   Encounter Date: 08/21/2019  PT End of Session - 08/21/19 1400    Visit Number  6    PT Start Time  0370    PT Stop Time  1400    PT Time Calculation (min)  42 min    Activity Tolerance  Patient tolerated treatment well    Behavior During Therapy  [facility] for tasks assessed/performed       Past Medical History:  Diagnosis Date  . Anemia   . Anxiety   . Diverticulitis   . Diverticulosis   . Gastroparesis   . GERD (gastroesophageal reflux disease)   . Glaucoma   . Hemorrhoids   . Hyperlipidemia   . Hypertension   . Hypertension   . IBS (irritable bowel syndrome)   . Rectal ulcer   . Segmental colitis ([place])   . Tubular adenoma of colon 05/2009    Past Surgical History:  Procedure Laterality Date  . APPENDECTOMY    . ARCUATE KERATECTOMY    . CATARACT EXTRACTION, BILATERAL  2015  . double pallital tori bone removed     . HERNIA REPAIR     umbilical  . KNEE SURGERY Bilateral   . morton's neuroma on rt foot and platar facial release    . MOUTH SURGERY     "bone shaved off roof of mouth"  . SALIVARY GLAND SURGERY     removal  . TONSILLECTOMY      There were no vitals filed for this visit.  Subjective Assessment - 08/21/19 1318    Subjective  Pt reports that she is feeling much better and would like to make today her last visit.    Currently in Pain?  No/denies    Pain Score  0-No pain    Pain Location  Shoulder    Pain Orientation  Left         [facility] PT  Assessment - 08/21/19 0001      AROM   Overall AROM Comments  L shoulder ROM equivalent to R shoulder ROM      Strength   Overall Strength Comments  L shoulder strength equivalent to R shoulder strength      Palpation   Palpation comment  no tenderness to palpation                    [facility] Adult PT Treatment/Exercise - 08/21/19 0001      Neck Exercises: Machines for Strengthening   UBE (Upper Arm Bike)  6mn fwd/341m bkwd     Cybex Row  10# 2x10    Cybex Chest Press  10# 2x10    Lat Pull  10# 2x10    Other Machines for Strengthening  tricep extension, bicep curls 15# and 5# 2x10    Other Machines for Strengthening  shoulder extension 5# 2x10      Shoulder Exercises: Stretch   Corner Stretch  2 reps;30 seconds    External Rotation Stretch  1 rep;30 seconds    Wall Stretch - Flexion  5 reps;10 seconds    Wall Stretch - ABduction  5 reps;10 seconds             PT Education - 08/21/19 1359    Education Details  Pt educated on d/c, continuance of HEP, and when to return if symptoms recur    Person(s) Educated  Patient    Methods  Demonstration;Explanation;Handout    Comprehension  Verbalized understanding;Returned demonstration       PT Short Term Goals - 08/21/19 1402      PT SHORT TERM GOAL #1   Title  Pt will be independent with HEP    Time  2    Period  Weeks    Status  Achieved    Target Date  08/12/19        PT Long Term Goals - 08/21/19 1402      PT LONG TERM GOAL #1   Title  Pt will demonstrate L shoulder ER/IR AROM equivalen to R shoulder    Status  Achieved      PT LONG TERM GOAL #2   Title  Pt will demonstrate ability to reach behind back to ~T10 with reports of no increased L shoulder pain in order to perform functional ADLs    Status  Achieved      PT LONG TERM GOAL #3   Title  Pt will demonstrate ability to reach behind head with no reports of increase in L shoulder pain in order to perform grooming/bathing ADLs    Status   Achieved      PT LONG TERM GOAL #4   Title  Pt will reduce L shoulder pain by 50% in order to return to functional ADLs    Status  Achieved      PT LONG TERM GOAL #5   Title  Pt will demonstrate ability to reach overhead to counter and retrieve 5# objects with no reports of increase in L shoulder pain to return to pain free ADLs    Status  Achieved   3lb           Plan - 08/21/19 1400    Clinical Impression Statement  Pt is being recommended for d/c secondary to meeting STG and LTG; pt reports she is satisfied with her progress and able to perform all functional ADLs at home. Pt provided with updated HEP and educated on when to return if symptoms recur.    PT Next Visit Plan  Pt recommended for d/c with updated HEP    PT Home Exercise Plan  scap retractions, ER with TB, upper trap stretch, ER wall stretch, IR stretch with towel, doorway pec stretch, flexion/abduction wall slides    Consulted and Agree with Plan of Care  Patient       Patient will benefit from skilled therapeutic intervention in order to improve the following deficits and impairments:     Visit Diagnosis: Chronic left shoulder pain  Decreased range of motion of left shoulder  Muscle weakness (generalized)     Problem List Patient Active Problem List   Diagnosis Date Noted  . Bilateral primary osteoarthritis of knee 06/11/2019  . Pain in left shoulder 05/22/2019  . Cellulitis of left upper extremity 10/15/2016  . Segmental colitis without complication ([place]) 08/65/7846  . Diverticulosis of colon without hemorrhage 02/04/2015  . Unspecified vitamin D deficiency 12/25/2013  . UTI (urinary tract infection) 09/29/2011  . 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 Atherosclerosis 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 04/20/2011  .  SPINAL STENOSIS, LUMBAR 03/09/2010  . OTHER OSTEOPOROSIS 03/09/2010  . SCOLIOSIS, LUMBAR SPINE 03/09/2010  . MIGRAINE, OPHTHALMIC 10/29/2009  . LUMBAR  RADICULOPATHY, ATYPICAL 10/29/2009  . POLYNEUROPATHY OTHER DISEASES CLASSIFIED ELSW 09/22/2009  . UNSPECIFIED PERIPHERAL VASCULAR DISEASE 09/22/2009  . ADENOMATOUS COLONIC POLYP 06/10/2009  . History of iron deficiency 05/06/2009  . CAROTID ARTERY DISEASE 07/28/2008  . Allergic rhinitis 03/20/2008  . DYSPHAGIA 03/12/2008  . GANGLION OF TENDON SHEATH 08/17/2007  . Hypothyroidism 11/15/2006  . Hyperlipidemia 11/07/2006  . ADVEF, DRUG/MEDICINAL/BIOLOGICAL SUBST NOS 11/07/2006  . Essential hypertension 10/18/2006  . GERD 10/18/2006  . DIVERTICULOSIS, COLON 10/18/2006   Amador Cunas, PT, DPT Donald Prose Danicka Hourihan 08/21/2019, 2:04 PM  [address] [place], [place], [address] Phone: [phone]   Fax:  [phone]  Name: KEAJA REAUME MRN: [id number] Date of Birth: 11-15-1932

## 2019-08-29 ENCOUNTER — Other Ambulatory Visit: Payer: Self-pay

## 2019-08-30 ENCOUNTER — Encounter: Payer: Self-pay | Admitting: Family Medicine

## 2019-08-30 ENCOUNTER — Ambulatory Visit (INDEPENDENT_AMBULATORY_CARE_PROVIDER_SITE_OTHER): Payer: Medicare Other | Admitting: Family Medicine

## 2019-08-30 DIAGNOSIS — R300 Dysuria: Secondary | ICD-10-CM

## 2019-08-30 LAB — POCT URINALYSIS DIPSTICK
Bilirubin, UA: NEGATIVE
Blood, UA: NEGATIVE
Glucose, UA: NEGATIVE
Ketones, UA: NEGATIVE
Nitrite, UA: POSITIVE
Protein, UA: POSITIVE — AB
Spec Grav, UA: 1.01 (ref 1.010–1.025)
Urobilinogen, UA: 1 E.U./dL
pH, UA: 7 (ref 5.0–8.0)

## 2019-08-30 MED ORDER — CEPHALEXIN 500 MG PO CAPS: 500.0000 mg | ORAL_CAPSULE | Freq: Three times a day (TID) | ORAL | 0 refills | Status: DC

## 2019-08-30 NOTE — Patient Instructions (Signed)

## 2019-08-30 NOTE — Progress Notes (Signed)
Subjective:     Patient ID: Rebecca [name], female   DOB: 08/12/1932, 86 y.o.   MRN: [id number]  HPI   Rebecca [name] is seen with about 5-day history of some suprapubic discomfort.  She is concerned about UTI.  She has had some mild urine frequency but no burning with urination.  No gross hematuria.  No fevers or chills.  No nausea or vomiting.  She has history of IBS but denies any recent change of bowel habits.  Past Medical History:  Diagnosis Date  . Anemia   . Anxiety   . Diverticulitis   . Diverticulosis   . Gastroparesis   . GERD (gastroesophageal reflux disease)   . Glaucoma   . Hemorrhoids   . Hyperlipidemia   . Hypertension   . Hypertension   . IBS (irritable bowel syndrome)   . Rectal ulcer   . Segmental colitis ([place])   . Tubular adenoma of colon 05/2009   Past Surgical History:  Procedure Laterality Date  . APPENDECTOMY    . ARCUATE KERATECTOMY    . CATARACT EXTRACTION, BILATERAL  2015  . double pallital tori bone removed     . HERNIA REPAIR     umbilical  . KNEE SURGERY Bilateral   . morton's neuroma on rt foot and platar facial release    . MOUTH SURGERY     "bone shaved off roof of mouth"  . SALIVARY GLAND SURGERY     removal  . TONSILLECTOMY      reports that she has never smoked. She has never used smokeless tobacco. She reports current alcohol use. She reports that she does not use drugs. family history includes Anuerysm in her father; Breast cancer in her daughter; Cancer in her daughter; Colon cancer in her maternal aunt and paternal grandmother; Colon polyps in her brother, daughter, and sister; Diabetes in her brother and mother; Heart disease in her brother, father, and mother; Hyperlipidemia in her brother and mother; Hypertension in her brother, father, and mother; Lung cancer in her sister; Migraines in her daughter. Allergies  Allergen Reactions  . Levaquin [Levofloxacin] Other (See Comments)    Very nauseous and shakey  .  Sulfamethoxazole-Trimethoprim Nausea Only    shakey  . Lactose Intolerance (Gi) Diarrhea and Other (See Comments)    Stomach hurts   . Tramadol Hcl Nausea Only and Palpitations  . Tramadol Hcl Nausea Only and Palpitations  . Augmentin [Amoxicillin-Pot Clavulanate] Diarrhea  . Ciprofloxacin Nausea Only    REACTION: Thrush, Nausea, Shakey  . Pneumococcal Vaccine Polyvalent     REACTION: RED AND RAISED RASH ON ARM  . Adhesive [Tape] Rash  . Codeine Nausea Only     Review of Systems  Constitutional: Negative for chills and fever.  Gastrointestinal: Positive for abdominal pain. Negative for nausea and vomiting.  Genitourinary: Positive for frequency. Negative for flank pain.  Neurological: Negative for dizziness.  Psychiatric/Behavioral: Negative for confusion.       Objective:   Physical Exam Vitals reviewed.  Constitutional:      Appearance: Normal appearance.  Cardiovascular:     Rate and Rhythm: Normal rate and regular rhythm.  Pulmonary:     Effort: Pulmonary effort is normal.     Breath sounds: Normal breath sounds.  Abdominal:     General: Bowel sounds are normal.     Palpations: Abdomen is soft.     Tenderness: There is no guarding or rebound.     Comments: Only very minimal tenderness suprapubic area to  deep palpation.  No masses  Neurological:     Mental Status: She is alert.        Assessment:     5-day history of mild suprapubic discomfort and urine frequency.  Rule out infection.   Urine dip does show 3+ leukocytes and positive nitrites    Plan:     -urine cx sent -stay well hydrated -start Keflex 500 mg po tid for 7 day pending cx.  [name] MD [facility]

## 2019-08-31 LAB — URINE CULTURE
MICRO NUMBER:: 10532379
Result:: NO GROWTH
SPECIMEN QUALITY:: ADEQUATE

## 2019-09-03 ENCOUNTER — Ambulatory Visit: Payer: Medicare Other | Admitting: Orthopaedic Surgery

## 2019-09-09 ENCOUNTER — Other Ambulatory Visit: Payer: Self-pay | Admitting: Family Medicine

## 2019-09-09 NOTE — Telephone Encounter (Signed)
Please advise 

## 2019-09-11 DIAGNOSIS — B373 Candidiasis of vulva and vagina: Secondary | ICD-10-CM | POA: Diagnosis not present

## 2019-09-11 DIAGNOSIS — Z01419 Encounter for gynecological examination (general) (routine) without abnormal findings: Secondary | ICD-10-CM | POA: Diagnosis not present

## 2019-09-11 DIAGNOSIS — M810 Age-related osteoporosis without current pathological fracture: Secondary | ICD-10-CM | POA: Diagnosis not present

## 2019-09-11 DIAGNOSIS — Z124 Encounter for screening for malignant neoplasm of cervix: Secondary | ICD-10-CM | POA: Diagnosis not present

## 2019-09-11 DIAGNOSIS — L900 Lichen sclerosus et atrophicus: Secondary | ICD-10-CM | POA: Diagnosis not present

## 2019-09-11 DIAGNOSIS — N816 Rectocele: Secondary | ICD-10-CM | POA: Diagnosis not present

## 2019-09-12 ENCOUNTER — Ambulatory Visit: Payer: Medicare Other | Admitting: Orthopedic Surgery

## 2019-09-13 DIAGNOSIS — H401132 Primary open-angle glaucoma, bilateral, moderate stage: Secondary | ICD-10-CM | POA: Diagnosis not present

## 2019-09-13 DIAGNOSIS — H52203 Unspecified astigmatism, bilateral: Secondary | ICD-10-CM | POA: Diagnosis not present

## 2019-09-13 DIAGNOSIS — Z961 Presence of intraocular lens: Secondary | ICD-10-CM | POA: Diagnosis not present

## 2019-09-21 ENCOUNTER — Other Ambulatory Visit: Payer: Self-pay | Admitting: Family Medicine

## 2019-09-21 DIAGNOSIS — K2100 Gastro-esophageal reflux disease with esophagitis, without bleeding: Secondary | ICD-10-CM

## 2019-09-24 ENCOUNTER — Ambulatory Visit (INDEPENDENT_AMBULATORY_CARE_PROVIDER_SITE_OTHER): Payer: Medicare Other | Admitting: Orthopaedic Surgery

## 2019-09-24 ENCOUNTER — Other Ambulatory Visit: Payer: Self-pay

## 2019-09-24 ENCOUNTER — Encounter: Payer: Self-pay | Admitting: Orthopaedic Surgery

## 2019-09-24 VITALS — Ht 62.5 in | Wt 127.0 lb

## 2019-09-24 DIAGNOSIS — G8929 Other chronic pain: Secondary | ICD-10-CM

## 2019-09-24 DIAGNOSIS — M25512 Pain in left shoulder: Secondary | ICD-10-CM

## 2019-09-24 NOTE — Progress Notes (Signed)
Office Visit Note   Patient: Rebecca [name]           Date of Birth: 22-Nov-1932           MRN: [id number] Visit Date: 09/24/2019              Requested by: [name], MD [place],  [place] [address] PCP: [name], MD   Assessment & Plan: Visit Diagnoses:  1. Chronic left shoulder pain     Plan: Rebecca [name] relates that physical therapy has "worked wonders" in regards to her left shoulder.  She is now moving her shoulder without pain, able to sleep on that side and perform activities of daily living without any problems.  Encouraged her to continue with her home exercise program and will return as needed basis  Follow-Up Instructions: Return if symptoms worsen or fail to improve.   Orders:  No orders of the defined types were placed in this encounter.  No orders of the defined types were placed in this encounter.     Procedures: No procedures performed   Clinical Data: No additional findings.   Subjective: Chief Complaint  Patient presents with  . Left Shoulder - Pain, Follow-up  Patient presents today for follow up on her left shoulder. She was here 9 weeks ago and received a cortisone injection and ordered physical therapy. She is doing great! She has finished therapy.  HPI  Review of Systems  Constitutional: Negative for fatigue.  HENT: Negative for ear pain.   Eyes: Negative for pain.  Respiratory: Negative for shortness of breath.   Cardiovascular: Negative for leg swelling.  Gastrointestinal: Negative for constipation and diarrhea.  Endocrine: Negative for cold intolerance and heat intolerance.  Genitourinary: Negative for difficulty urinating.  Musculoskeletal: Negative for joint swelling.  Skin: Negative for rash.  Allergic/Immunologic: Negative for food allergies.  Neurological: Negative for weakness.  Hematological: Does not bruise/bleed easily.  Psychiatric/Behavioral: Negative for sleep disturbance.      Objective: Vital Signs: Ht 5' 2.5" (1.588 m)   Wt 127 lb (57.6 kg)   BMI 22.86 kg/m   Physical Exam Constitutional:      Appearance: She is well-developed.  Eyes:     Pupils: Pupils are equal, round, and reactive to light.  Pulmonary:     Effort: Pulmonary effort is normal.  Skin:    General: Skin is warm and dry.  Neurological:     Mental Status: She is alert and oriented to person, place, and time.  Psychiatric:        Behavior: Behavior normal.     Ortho Exam negative impingement testing left shoulder.  Full quick overhead motion and able to touch the middle of her back.  Good strength.  Good grip.  No crepitation or localized areas of tenderness  Specialty Comments:  No specialty comments available.  Imaging: No results found.   PMFS History: Patient Active Problem List   Diagnosis Date Noted  . Bilateral primary osteoarthritis of knee 06/11/2019  . Pain in left shoulder 05/22/2019  . Cellulitis of left upper extremity 10/15/2016  . Segmental colitis without complication ([place]) 39/76/7341  . Diverticulosis of colon without hemorrhage 02/04/2015  . Unspecified vitamin D deficiency 12/25/2013  . UTI (urinary tract infection) 09/29/2011  . 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 Atherosclerosis 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 04/20/2011  . SPINAL STENOSIS, LUMBAR 03/09/2010  . OTHER OSTEOPOROSIS 03/09/2010  . SCOLIOSIS, LUMBAR SPINE 03/09/2010  .  MIGRAINE, OPHTHALMIC 10/29/2009  . LUMBAR RADICULOPATHY, ATYPICAL 10/29/2009  . POLYNEUROPATHY OTHER DISEASES CLASSIFIED ELSW 09/22/2009  . UNSPECIFIED PERIPHERAL VASCULAR DISEASE 09/22/2009  . ADENOMATOUS COLONIC POLYP 06/10/2009  . History of iron deficiency 05/06/2009  . CAROTID ARTERY DISEASE 07/28/2008  . Allergic rhinitis 03/20/2008  . DYSPHAGIA 03/12/2008  . GANGLION OF TENDON SHEATH 08/17/2007  . Hypothyroidism 11/15/2006  . Hyperlipidemia 11/07/2006  . ADVEF,  DRUG/MEDICINAL/BIOLOGICAL SUBST NOS 11/07/2006  . Essential hypertension 10/18/2006  . GERD 10/18/2006  . DIVERTICULOSIS, COLON 10/18/2006   Past Medical History:  Diagnosis Date  . Anemia   . Anxiety   . Diverticulitis   . Diverticulosis   . Gastroparesis   . GERD (gastroesophageal reflux disease)   . Glaucoma   . Hemorrhoids   . Hyperlipidemia   . Hypertension   . Hypertension   . IBS (irritable bowel syndrome)   . Rectal ulcer   . Segmental colitis ([place])   . Tubular adenoma of colon 05/2009    Family History  Problem Relation Age of Onset  . Diabetes Mother   . Heart disease Mother   . Hyperlipidemia Mother   . Hypertension Mother   . Anuerysm Father   . Heart disease Father   . Hypertension Father   . Migraines Daughter   . Colon polyps Sister   . Lung cancer Sister   . Colon polyps Brother   . Diabetes Brother   . Hyperlipidemia Brother   . Hypertension Brother   . Colon polyps Daughter   . Breast cancer Daughter   . Colon cancer Maternal Aunt        Rectal cancer  . Cancer Daughter        salivary  . Colon cancer Paternal Grandmother        with possible stomach cancer  . Heart disease Brother   . Crohn's disease Neg Hx   . Pancreatic cancer Neg Hx     Past Surgical History:  Procedure Laterality Date  . APPENDECTOMY    . ARCUATE KERATECTOMY    . CATARACT EXTRACTION, BILATERAL  2015  . double pallital tori bone removed     . HERNIA REPAIR     umbilical  . KNEE SURGERY Bilateral   . morton's neuroma on rt foot and platar facial release    . MOUTH SURGERY     "bone shaved off roof of mouth"  . SALIVARY GLAND SURGERY     removal  . TONSILLECTOMY     Social History   Occupational History  . Occupation: Glass blower/designer    Comment: retired  Tobacco Use  . Smoking status: Never Smoker  . Smokeless tobacco: Never Used  Vaping Use  . Vaping Use: Never used  Substance and Sexual Activity  . Alcohol use: Yes    Alcohol/week: 0.0 standard drinks      Comment: rare  . Drug use: No  . Sexual activity: Yes

## 2019-10-04 ENCOUNTER — Encounter: Payer: Self-pay | Admitting: Family Medicine

## 2019-10-28 ENCOUNTER — Other Ambulatory Visit: Payer: Self-pay | Admitting: Family Medicine

## 2019-10-28 NOTE — Telephone Encounter (Signed)
Please advise 

## 2019-11-06 ENCOUNTER — Ambulatory Visit (INDEPENDENT_AMBULATORY_CARE_PROVIDER_SITE_OTHER): Payer: Medicare Other

## 2019-11-06 ENCOUNTER — Other Ambulatory Visit: Payer: Self-pay

## 2019-11-06 ENCOUNTER — Encounter: Payer: Self-pay | Admitting: Orthopaedic Surgery

## 2019-11-06 ENCOUNTER — Ambulatory Visit (INDEPENDENT_AMBULATORY_CARE_PROVIDER_SITE_OTHER): Payer: Medicare Other | Admitting: Orthopaedic Surgery

## 2019-11-06 VITALS — Ht 62.0 in | Wt 122.0 lb

## 2019-11-06 DIAGNOSIS — R102 Pelvic and perineal pain: Secondary | ICD-10-CM

## 2019-11-06 DIAGNOSIS — M79605 Pain in left leg: Secondary | ICD-10-CM | POA: Insufficient documentation

## 2019-11-06 DIAGNOSIS — M25559 Pain in unspecified hip: Secondary | ICD-10-CM | POA: Diagnosis not present
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
## 2019-11-07 ENCOUNTER — Telehealth: Payer: Self-pay | Admitting: Orthopedic Surgery

## 2019-11-07 NOTE — Telephone Encounter (Signed)
Ms. Sahagian called requesting to speak with Dr. Rudene Anda assistant.  She is scheduled for a CT scan but feels it is too far out and she cannot wait that long.  She can be reached at [phone]

## 2019-11-07 NOTE — Telephone Encounter (Signed)
She is scheduled for the 18th. Please advise. Should she do something during the time while she is waiting?

## 2019-11-07 NOTE — Telephone Encounter (Signed)
Called patient and told her the status has been changed to urgent.

## 2019-11-07 NOTE — Telephone Encounter (Signed)
Please request ASAP for CT scan-concerned about a hip fracture and cannot wait until the 18th

## 2019-11-08 ENCOUNTER — Telehealth: Payer: Self-pay | Admitting: Orthopaedic Surgery

## 2019-11-08 NOTE — Telephone Encounter (Signed)
Any chance her appointment will be moved up? Please advise

## 2019-11-08 NOTE — Telephone Encounter (Signed)
Pt called in stating she hasn't heard anything from urgent care; pt states she was told they would call her for an appt.  [phone]

## 2019-11-19 ENCOUNTER — Other Ambulatory Visit: Payer: Self-pay

## 2019-11-19 ENCOUNTER — Ambulatory Visit (INDEPENDENT_AMBULATORY_CARE_PROVIDER_SITE_OTHER): Payer: Medicare Other

## 2019-11-19 DIAGNOSIS — Z0000 Encounter for general adult medical examination without abnormal findings: Secondary | ICD-10-CM | POA: Diagnosis not present

## 2019-11-19 NOTE — Progress Notes (Signed)
Subjective:   Rebecca [name] is a 86 y.o. female who presents for Medicare Annual (Subsequent) preventive examination.  I connected with Vera Furniss today by telephone and verified that I am speaking with the correct person using two identifiers. Location patient: home Location provider: work Persons participating in the virtual visit: patient, provider.   I discussed the limitations, risks, security and privacy concerns of performing an evaluation and management service by telephone and the availability of in person appointments. I also discussed with the patient that there may be a patient responsible charge related to this service. The patient expressed understanding and verbally consented to this telephonic visit.    Interactive audio and video telecommunications were attempted between this provider and patient, however failed, due to patient having technical difficulties OR patient did not have access to video capability.  We continued and completed visit with audio only.      Review of Systems    N/A Cardiac Risk Factors include: advanced age (>63men, >29 women);dyslipidemia;hypertension     Objective:    Today's Vitals   11/19/19 1118  PainSc: 2    There is no height or weight on file to calculate BMI.  Advanced Directives 11/19/2019 07/29/2019 05/09/2018 02/08/2017 02/23/2016  Does Patient Have a Medical Advance Directive? Yes Yes Yes Yes Yes  Type of Paramedic of [place];Living will - [place];Living will - [place];Living will  Does patient want to make changes to medical advance directive? No - Patient declined No - Patient declined - - -  Copy of [place] in Chart? No - copy requested - No - copy requested - No - copy requested    Current Medications (verified) Outpatient Encounter Medications as of 11/19/2019  Medication Sig  . ALPRAZolam (XANAX) 0.25 MG tablet TAKE 1  TABLET(0.25 MG) BY MOUTH AT BEDTIME AS NEEDED  . Calcium Carbonate-Vitamin D (CALCIUM-VITAMIN D) 500-200 MG-UNIT per tablet Take 1 tablet by mouth 2 (two) times daily with a meal.    . Cholecalciferol (VITAMIN D3) 2000 UNITS TABS Take 1 tablet by mouth daily.   . diclofenac sodium (VOLTAREN) 1 % GEL Apply 2 g topically 4 (four) times daily.  [name] Kitchen esomeprazole (NEXIUM) 20 MG capsule Take 20 mg by mouth 2 (two) times daily before a meal.  . gabapentin (NEURONTIN) 100 MG capsule TAKE 2 CAPSULES(200 MG) BY MOUTH AT BEDTIME  . levothyroxine (SYNTHROID) 88 MCG tablet Take 1 tablet (88 mcg total) by mouth daily.  [name] Kitchen losartan (COZAAR) 100 MG tablet Take 1 tablet (100 mg total) by mouth daily.  . metoCLOPramide (REGLAN) 5 MG tablet TAKE 1 TABLET BY MOUTH TWICE DAILY WITH MEALS  . simvastatin (ZOCOR) 20 MG tablet Take 1 tablet (20 mg total) by mouth at bedtime.  . timolol (TIMOPTIC) 0.5 % ophthalmic solution INT 1 GTT IN OU BID  . vitamin B-12 (CYANOCOBALAMIN) 500 MCG tablet Take 500 mcg by mouth every morning.   . vitamin C (ASCORBIC ACID) 500 MG tablet Take 500 mg by mouth daily.  . vitamin E (VITAMIN E) 400 UNIT capsule Take 400 Units by mouth daily.  . Zoledronic Acid (RECLAST IV) Inject into the vein. Once per year. (Patient not taking: Reported on 11/19/2019)  . [DISCONTINUED] cephALEXin (KEFLEX) 500 MG capsule Take 1 capsule (500 mg total) by mouth 3 (three) times daily.  . [DISCONTINUED] olmesartan (BENICAR) 20 MG tablet Take 20 mg by mouth daily.     No facility-administered  encounter medications on file as of 11/19/2019.    Allergies (verified) Levaquin [levofloxacin], Sulfamethoxazole-trimethoprim, Lactose intolerance (gi), Tramadol hcl, Tramadol hcl, Augmentin [amoxicillin-pot clavulanate], Ciprofloxacin, Pneumococcal vaccine polyvalent, Adhesive [tape], and Codeine   History: Past Medical History:  Diagnosis Date  . Anemia   . Anxiety   . Diverticulitis   . Diverticulosis   .  Gastroparesis   . GERD (gastroesophageal reflux disease)   . Glaucoma   . Hemorrhoids   . Hyperlipidemia   . Hypertension   . Hypertension   . IBS (irritable bowel syndrome)   . Rectal ulcer   . Segmental colitis ([place])   . Tubular adenoma of colon 05/2009   Past Surgical History:  Procedure Laterality Date  . APPENDECTOMY    . ARCUATE KERATECTOMY    . CATARACT EXTRACTION, BILATERAL  2015  . double pallital tori bone removed     . HERNIA REPAIR     umbilical  . KNEE SURGERY Bilateral   . morton's neuroma on rt foot and platar facial release    . MOUTH SURGERY     "bone shaved off roof of mouth"  . SALIVARY GLAND SURGERY     removal  . TONSILLECTOMY     Family History  Problem Relation Age of Onset  . Diabetes Mother   . Heart disease Mother   . Hyperlipidemia Mother   . Hypertension Mother   . Anuerysm Father   . Heart disease Father   . Hypertension Father   . Migraines Daughter   . Kidney Stones Daughter   . Colon polyps Sister   . Lung cancer Sister   . Colon polyps Brother   . Diabetes Brother   . Hyperlipidemia Brother   . Hypertension Brother   . Colon polyps Daughter   . Breast cancer Daughter   . Colon cancer Maternal Aunt        Rectal cancer  . Cancer Daughter        salivary  . Colon cancer Paternal Grandmother        with possible stomach cancer  . Heart disease Brother   . Crohn's disease Neg Hx   . Pancreatic cancer Neg Hx    Social History   Socioeconomic History  . Marital status: Widowed    Spouse name: Not on file  . Number of children: 3  . Years of education: Not on file  . Highest education level: Not on file  Occupational History  . Occupation: Glass blower/designer    Comment: retired  Tobacco Use  . Smoking status: Never Smoker  . Smokeless tobacco: Never Used  Vaping Use  . Vaping Use: Never used  Substance and Sexual Activity  . Alcohol use: Yes    Alcohol/week: 0.0 standard drinks    Comment: rare  . Drug use: No  .  Sexual activity: Yes  Other Topics Concern  . 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 Difficulty of Paying Living Expenses: Not hard at all  Food Insecurity: No Food Insecurity  . Worried About Running  Out of Food in the Last Year: Never true  . Ran Out of Food in the Last Year: Never true  Transportation Needs: No Transportation Needs  . Lack of Transportation (Medical): No  . Lack of Transportation (Non-Medical): No  Physical Activity: Insufficiently Active  . Days of Exercise per Week: 7 days  . Minutes of Exercise per Session: 20 min  Stress: No Stress Concern Present  . Feeling of Stress : Not at all  Social Connections: Moderately Isolated  . Frequency of Communication with Friends and Family: More than three times a week  . Frequency of Social Gatherings with Friends and Family: More than three times a week  . Attends Religious Services: More than 4 times per year  . Active Member of Clubs or Organizations: No  . Attends Archivist Meetings: Never  . Marital Status: Widowed    Tobacco Counseling Counseling given: Not Answered   Clinical Intake:  Pre-visit preparation completed: Yes  Pain : 0-10 Pain Score: 2  Pain Type: Acute pain Pain Location: Hip Pain Orientation: Left Pain Onset: 1 to 4 weeks ago Pain Relieving Factors: Motrin, heating pad Effect of Pain on Daily Activities: Walking  Pain Relieving Factors: Motrin, heating pad  Nutritional Risks: None Diabetes: No  How often do you need to have someone help you when you read instructions, pamphlets, or other written materials from your doctor or  pharmacy?: 1 - Never What is the last grade level you completed in school?: High School Graduate  Diabetic?No  Interpreter Needed?: No  Information entered by :: [place] of Daily Living In your present state of health, do you have any difficulty performing the following activities: 11/19/2019  Hearing? N  Vision? N  Difficulty concentrating or making decisions? N  Walking or climbing stairs? Y  Comment Patient has knee pain  Dressing or bathing? N  Doing errands, shopping? N  Preparing Food and eating ? N  Using the Toilet? N  In the past six months, have you accidently leaked urine? N  Do you have problems with loss of bowel control? N  Managing your Medications? N  Managing your Finances? N  Housekeeping or managing your Housekeeping? N  Some recent data might be hidden    Patient Care Team: [name], MD as PCP - General (Family Medicine) Ladene Artist, MD as Consulting Physician (Gastroenterology) Aloha Gell, MD as Consulting Physician (Obstetrics and Gynecology) Luberta Mutter, MD as Consulting Physician (Ophthalmology)  Indicate any recent Medical Services you may have received from other than [facility] providers in the past year (date may be approximate).     Assessment:   This is a routine wellness examination for Honor.  Hearing/Vision screen  Hearing Screening   125Hz  250Hz  500Hz  1000Hz  2000Hz  3000Hz  4000Hz  6000Hz  8000Hz   Right ear:           Left ear:           Vision Screening Comments: Patient states gets eye checked annually   Dietary issues and exercise activities discussed: Current Exercise Habits: Home exercise routine, Type of exercise: walking, Time (Minutes): 20, Frequency (Times/Week): 7, Weekly Exercise (Minutes/Week): 140, Intensity: Mild, Exercise limited by: orthopedic condition(s)  Goals    . patient     Will continue walking; try to build up to 30 minutes 5 days a week     . Patient Stated     Maintain  level of function, staying independent    . Patient Stated  I would like to be able to walk without my cruches      Depression Screen PHQ 2/9 Scores 11/19/2019 02/15/2019 05/09/2018 02/13/2018 02/08/2017 01/01/2016 09/24/2014  PHQ - 2 Score 0 0 0 0 0 0 0  PHQ- 9 Score 0 - 0 - - - -    Fall Risk Fall Risk  11/19/2019 02/15/2019 05/09/2018 02/19/2018 02/13/2018  Falls in the past year? 0 0 0 0 0  Comment - - - Emmi Telephone Survey: data to providers prior to load -  Number falls in past yr: 0 - - - -  Comment - - - - -  Injury with Fall? 0 - - - -  Risk for fall due to : Medication side effect;Orthopedic patient - - - -  Follow up Falls evaluation completed;Falls prevention discussed - - - -    Any stairs in or around the home? No  If so, are there any without handrails? No Home free of loose throw rugs in walkways, pet beds, electrical cords, etc? Yes  Adequate lighting in your home to reduce risk of falls? Yes   ASSISTIVE DEVICES UTILIZED TO PREVENT FALLS:  Life alert? No  Use of a cane, walker or w/c? No  Grab bars in the bathroom? No  Shower chair or bench in shower? No  Elevated toilet seat or a handicapped toilet? Yes     Cognitive Function: MMSE - Mini Mental State Exam 02/08/2017  Not completed: (No Data)     6CIT Screen 11/19/2019  What Year? 0 points  What month? 0 points  What time? 0 points  Count back from 20 0 points  Months in reverse 0 points  Repeat phrase 0 points  Total Score 0    Immunizations Immunization History  Administered Date(s) Administered  . Fluad Quad(high Dose 65+) 12/31/2018  . H1N1 06/02/2008  . Influenza Split 12/29/2010  . Influenza Whole 04/04/2001, 02/01/2007, 12/24/2007, 12/16/2008, 01/15/2009, 01/05/2010  . Influenza, High Dose Seasonal PF 12/31/2014, 01/01/2016, 01/11/2017, 01/02/2018  . Influenza,inj,Quad PF,6+ Mos 12/19/2012, 12/25/2013  . PFIZER SARS-COV-2 Vaccination 05/06/2019, 05/27/2019  . Pneumococcal  Polysaccharide-23 03/05/1999  . Td 04/05/1999, 07/28/2008  . Zoster 10/24/2007  . Zoster Recombinat (Shingrix) 08/04/2016, 10/12/2016    TDAP status: Due, Education has been provided regarding the importance of this vaccine. Advised may receive this vaccine at local pharmacy or Health Dept. Aware to provide a copy of the vaccination record if obtained from local pharmacy or Health Dept. Verbalized acceptance and understanding. Flu Vaccine status: Up to date Pneumococcal vaccine status: Up to date Covid-19 vaccine status: Completed vaccines  Qualifies for Shingles Vaccine? Yes   Zostavax completed Yes   Shingrix Completed?: Yes  Screening Tests Health Maintenance  Topic Date Due  . TETANUS/TDAP  07/29/2018  . INFLUENZA VACCINE  11/03/2019  . DEXA SCAN  Completed  . COVID-19 Vaccine  Completed  . MAMMOGRAM  Discontinued    Health Maintenance  Health Maintenance Due  Topic Date Due  . TETANUS/TDAP  07/29/2018  . INFLUENZA VACCINE  11/03/2019    Colorectal cancer screening: No longer required.  Mammogram status: No longer required.  Bone Density status: Completed 05/01/2017. Results reflect: Bone density results: OSTEOPOROSIS. Repeat every 3 years.  Lung Cancer Screening: (Low Dose CT Chest recommended if Age 65-80 years, 30 pack-year currently smoking OR have quit w/in 15years.) does not qualify.   Lung Cancer Screening Referral: N/A  Additional Screening:  Hepatitis C Screening: does not qualify;   Vision Screening: Recommended  annual ophthalmology exams for early detection of glaucoma and other disorders of the eye. Is the patient up to date with their annual eye exam?  Yes  Who is the provider or what is the name of the office in which the patient attends annual eye exams? [facility]  If pt is not established with a provider, would they like to be referred to a provider to establish care? No .   Dental Screening: Recommended annual dental exams for  proper oral hygiene  Community Resource Referral / Chronic Care Management: CRR required this visit?  No   CCM required this visit?  No      Plan:     I have personally reviewed and noted the following in the patient's chart:   . Medical and social history . Use of alcohol, tobacco or illicit drugs  . Current medications and supplements . Functional ability and status . Nutritional status . Physical activity . Advanced directives . List of other physicians . Hospitalizations, surgeries, and ER visits in previous 12 months . Vitals . Screenings to include cognitive, depression, and falls . Referrals and appointments  In addition, I have reviewed and discussed with patient certain preventive protocols, quality metrics, and best practice recommendations. A written personalized care plan for preventive services as well as general preventive health recommendations were provided to patient.     Ofilia Neas, LPN   [id number]/6825   Nurse Notes: None

## 2019-11-19 NOTE — Patient Instructions (Signed)
Rebecca [name] , Thank you for taking time to come for your Medicare Wellness Visit. I appreciate your ongoing commitment to your health goals. Please review the following plan we discussed and let me know if I can assist you in the future.   Screening recommendations/referrals: Colonoscopy: No longer required  Mammogram: No longer required  Bone Density: Up to date, next due 05/01/2020 Recommended yearly ophthalmology/optometry visit for glaucoma screening and checkup Recommended yearly dental visit for hygiene and checkup  Vaccinations: Influenza vaccine: Up to date, next due fall 2021 Pneumococcal vaccine: Completed series Tdap vaccine: Currently due, you may contact your insurance to discuss cost or await injury to receive  Shingles vaccine: completed series.    Advanced directives: Advance directive discussed with you today. Even though you declined this today please call our office should you change your mind and we can give you the proper paperwork for you to fill out.   Conditions/risks identified: None   Next appointment: None   Preventive Care 65 Years and Older, Female Preventive care refers to lifestyle choices and visits with your health care provider that can promote health and wellness. What does preventive care include?  A yearly physical exam. This is also called an annual well check.  Dental exams once or twice a year.  Routine eye exams. Ask your health care provider how often you should have your eyes checked.  Personal lifestyle choices, including:  Daily care of your teeth and gums.  Regular physical activity.  Eating a healthy diet.  Avoiding tobacco and drug use.  Limiting alcohol use.  Practicing safe sex.  Taking low-dose aspirin every day.  Taking vitamin and mineral supplements as recommended by your health care provider. What happens during an annual well check? The services and screenings done by your health care provider during your  annual well check will depend on your age, overall health, lifestyle risk factors, and family history of disease. Counseling  Your health care provider may ask you questions about your:  Alcohol use.  Tobacco use.  Drug use.  Emotional well-being.  Home and relationship well-being.  Sexual activity.  Eating habits.  History of falls.  Memory and ability to understand (cognition).  Work and work Statistician.  Reproductive health. Screening  You may have the following tests or measurements:  Height, weight, and BMI.  Blood pressure.  Lipid and cholesterol levels. These may be checked every 5 years, or more frequently if you are over 22 years old.  Skin check.  Lung cancer screening. You may have this screening every year starting at age 60 if you have a 30-pack-year history of smoking and currently smoke or have quit within the past 15 years.  Fecal occult blood test (FOBT) of the stool. You may have this test every year starting at age 2.  Flexible sigmoidoscopy or colonoscopy. You may have a sigmoidoscopy every 5 years or a colonoscopy every 10 years starting at age 66.  Hepatitis C blood test.  Hepatitis B blood test.  Sexually transmitted disease (STD) testing.  Diabetes screening. This is done by checking your blood sugar (glucose) after you have not eaten for a while (fasting). You may have this done every 1-3 years.  Bone density scan. This is done to screen for osteoporosis. You may have this done starting at age 65.  Mammogram. This may be done every 1-2 years. Talk to your health care provider about how often you should have regular mammograms. Talk with your health care provider  about your test results, treatment options, and if necessary, the need for more tests. Vaccines  Your health care provider may recommend certain vaccines, such as:  Influenza vaccine. This is recommended every year.  Tetanus, diphtheria, and acellular pertussis (Tdap, Td)  vaccine. You may need a Td booster every 10 years.  Zoster vaccine. You may need this after age 46.  Pneumococcal 13-valent conjugate (PCV13) vaccine. One dose is recommended after age 53.  Pneumococcal polysaccharide (PPSV23) vaccine. One dose is recommended after age 65. Talk to your health care provider about which screenings and vaccines you need and how often you need them. This information is not intended to replace advice given to you by your health care provider. Make sure you discuss any questions you have with your health care provider. Document Released: 04/17/2015 Document Revised: 12/09/2015 Document Reviewed: 01/20/2015 Elsevier Interactive Patient Education  2017 [place] Prevention in the Home Falls can cause injuries. They can happen to people of all ages. There are many things you can do to make your home safe and to help prevent falls. What can I do on the outside of my home?  Regularly fix the edges of walkways and driveways and fix any cracks.  Remove anything that might make you trip as you walk through a door, such as a raised step or threshold.  Trim any bushes or trees on the path to your home.  Use bright outdoor lighting.  Clear any walking paths of anything that might make someone trip, such as rocks or tools.  Regularly check to see if handrails are loose or broken. Make sure that both sides of any steps have handrails.  Any raised decks and porches should have guardrails on the edges.  Have any leaves, snow, or ice cleared regularly.  Use sand or salt on walking paths during winter.  Clean up any spills in your garage right away. This includes oil or grease spills. What can I do in the bathroom?  Use night lights.  Install grab bars by the toilet and in the tub and shower. Do not use towel bars as grab bars.  Use non-skid mats or decals in the tub or shower.  If you need to sit down in the shower, use a plastic, non-slip  stool.  Keep the floor dry. Clean up any water that spills on the floor as soon as it happens.  Remove soap buildup in the tub or shower regularly.  Attach bath mats securely with double-sided non-slip rug tape.  Do not have throw rugs and other things on the floor that can make you trip. What can I do in the bedroom?  Use night lights.  Make sure that you have a light by your bed that is easy to reach.  Do not use any sheets or blankets that are too big for your bed. They should not hang down onto the floor.  Have a firm chair that has side arms. You can use this for support while you get dressed.  Do not have throw rugs and other things on the floor that can make you trip. What can I do in the kitchen?  Clean up any spills right away.  Avoid walking on wet floors.  Keep items that you use a lot in easy-to-reach places.  If you need to reach something above you, use a strong step stool that has a grab bar.  Keep electrical cords out of the way.  Do not use floor polish or  wax that makes floors slippery. If you must use wax, use non-skid floor wax.  Do not have throw rugs and other things on the floor that can make you trip. What can I do with my stairs?  Do not leave any items on the stairs.  Make sure that there are handrails on both sides of the stairs and use them. Fix handrails that are broken or loose. Make sure that handrails are as long as the stairways.  Check any carpeting to make sure that it is firmly attached to the stairs. Fix any carpet that is loose or worn.  Avoid having throw rugs at the top or bottom of the stairs. If you do have throw rugs, attach them to the floor with carpet tape.  Make sure that you have a light switch at the top of the stairs and the bottom of the stairs. If you do not have them, ask someone to add them for you. What else can I do to help prevent falls?  Wear shoes that:  Do not have high heels.  Have rubber bottoms.  Are  comfortable and fit you well.  Are closed at the toe. Do not wear sandals.  If you use a stepladder:  Make sure that it is fully opened. Do not climb a closed stepladder.  Make sure that both sides of the stepladder are locked into place.  Ask someone to hold it for you, if possible.  Clearly mark and make sure that you can see:  Any grab bars or handrails.  First and last steps.  Where the edge of each step is.  Use tools that help you move around (mobility aids) if they are needed. These include:  Canes.  Walkers.  Scooters.  Crutches.  Turn on the lights when you go into a dark area. Replace any light bulbs as soon as they burn out.  Set up your furniture so you have a clear path. Avoid moving your furniture around.  If any of your floors are uneven, fix them.  If there are any pets around you, be aware of where they are.  Review your medicines with your doctor. Some medicines can make you feel dizzy. This can increase your chance of falling. Ask your doctor what other things that you can do to help prevent falls. This information is not intended to replace advice given to you by your health care provider. Make sure you discuss any questions you have with your health care provider. Document Released: 01/15/2009 Document Revised: 08/27/2015 Document Reviewed: 04/25/2014 Elsevier Interactive Patient Education  2017 [employer].

## 2019-11-20 ENCOUNTER — Other Ambulatory Visit: Payer: Self-pay

## 2019-11-20 ENCOUNTER — Ambulatory Visit
Admission: RE | Admit: 2019-11-20 | Discharge: 2019-11-20 | Disposition: A | Discharge status: Home / Self Care | Payer: Medicare Other | Source: Ambulatory Visit | Attending: Orthopaedic Surgery | Admitting: Orthopaedic Surgery

## 2019-11-20 ENCOUNTER — Ambulatory Visit: Admission: RE | Admit: 2019-11-20 | Payer: Medicare Other | Source: Ambulatory Visit

## 2019-11-20 DIAGNOSIS — M25559 Pain in unspecified hip: Secondary | ICD-10-CM

## 2019-11-20 DIAGNOSIS — K5730 Diverticulosis of large intestine without perforation or abscess without bleeding: Secondary | ICD-10-CM | POA: Diagnosis not present

## 2019-11-20 DIAGNOSIS — M47816 Spondylosis without myelopathy or radiculopathy, lumbar region: Secondary | ICD-10-CM | POA: Diagnosis not present

## 2019-11-20 DIAGNOSIS — M79605 Pain in left leg: Secondary | ICD-10-CM

## 2019-11-20 DIAGNOSIS — R102 Pelvic and perineal pain: Secondary | ICD-10-CM

## 2019-11-20 DIAGNOSIS — I7090 Unspecified atherosclerosis: Secondary | ICD-10-CM | POA: Diagnosis not present

## 2019-11-20 DIAGNOSIS — S72115A Nondisplaced fracture of greater trochanter of left femur, initial encounter for closed fracture: Secondary | ICD-10-CM | POA: Diagnosis not present

## 2019-11-21 ENCOUNTER — Telehealth: Payer: Self-pay

## 2019-11-21 NOTE — Progress Notes (Signed)
Please have [name] call pt and relate results or  I will call if he is not available-let me know-thanks

## 2019-11-21 NOTE — Telephone Encounter (Signed)
FYI-  CT Pelvis results are in patient's chart.  [facility] called with report.

## 2019-11-21 NOTE — Telephone Encounter (Signed)
Please see other note sent to you re the results

## 2019-11-21 NOTE — Telephone Encounter (Signed)
Please see message under Result Notes and call with results.

## 2019-11-30 ENCOUNTER — Other Ambulatory Visit: Payer: Self-pay | Admitting: Family Medicine

## 2019-11-30 DIAGNOSIS — K2100 Gastro-esophageal reflux disease with esophagitis, without bleeding: Secondary | ICD-10-CM

## 2019-12-12 ENCOUNTER — Other Ambulatory Visit: Payer: Self-pay | Admitting: Family Medicine

## 2019-12-13 DIAGNOSIS — H5201 Hypermetropia, right eye: Secondary | ICD-10-CM | POA: Diagnosis not present

## 2019-12-13 DIAGNOSIS — H401132 Primary open-angle glaucoma, bilateral, moderate stage: Secondary | ICD-10-CM | POA: Diagnosis not present

## 2019-12-17 ENCOUNTER — Ambulatory Visit (INDEPENDENT_AMBULATORY_CARE_PROVIDER_SITE_OTHER): Payer: Medicare Other

## 2019-12-17 ENCOUNTER — Other Ambulatory Visit: Payer: Self-pay

## 2019-12-17 ENCOUNTER — Ambulatory Visit (INDEPENDENT_AMBULATORY_CARE_PROVIDER_SITE_OTHER): Payer: Medicare Other | Admitting: Orthopaedic Surgery

## 2019-12-17 ENCOUNTER — Encounter: Payer: Self-pay | Admitting: Orthopaedic Surgery

## 2019-12-17 VITALS — Ht 62.0 in | Wt 122.0 lb

## 2019-12-17 DIAGNOSIS — M25552 Pain in left hip: Secondary | ICD-10-CM | POA: Diagnosis not present

## 2019-12-17 NOTE — Progress Notes (Signed)
Office Visit Note   Patient: Rebecca [name]           Date of Birth: [birth year]           MRN: [id number] Visit Date: 12/17/2019              Requested by: [name], MD [place],  [place] [id number] PCP: [name], MD   Assessment & Plan: Visit Diagnoses:  1. Pain in left hip     Plan: [name] had an injury to her left hip about 6 to 7 weeks ago with persistent pain over the lateral aspect of her hip.  I ordered a CT scan that suggested a nondisplaced fracture of the greater trochanter with hematoma.  Over the past several weeks she notes that she is feeling better but still uses a crutch as an ambulatory aid.  She has more of a "ache" than actual pain.  X-rays today did not demonstrate any acute changes.  Hopefully over the next several weeks majority of her pain will resolve.  Return in 1 month.  Hopefully at that point she will be off her crutch  Follow-Up Instructions: Return in about 1 month (around 01/16/2020).   Orders:  Orders Placed This Encounter  Procedures  . XR Pelvis 1-2 Views   No orders of the defined types were placed in this encounter.     Procedures: No procedures performed   Clinical Data: No additional findings.   Subjective: Chief Complaint  Patient presents with  . Left Leg - Follow-up    CT pelvis review  Patient presents today for follow up. She had a CT scan of her pelvis on 11/20/2019, and is here today for the results. Patient states that [name] called her with the results, but wanted to follow up. She has had more pain the last few days. She walks with a crutch for support. She has been taking motrin if needed.  No new injuries  HPI  Review of Systems   Objective: Vital Signs: Ht 5\' 2"  (1.575 m)   Wt 122 lb (55.3 kg)   BMI 22.31 kg/m   Physical Exam Constitutional:      Appearance: She is well-developed.  Eyes:     Pupils: Pupils are equal, round, and reactive to light.  Pulmonary:      Effort: Pulmonary effort is normal.  Skin:    General: Skin is warm and dry.  Neurological:     Mental Status: She is alert and oriented to person, place, and time.  Psychiatric:        Behavior: Behavior normal.     Ortho Exam awake alert and oriented x3.  Comfortable sitting.  No acute distress.  Has very minimal tenderness over the greater trochanter of her left hip.  Very thin.  Skin intact.  Walks with the use of a single crutch with a very mild limp.  No back pain no significant pain with internal/external rotation of her left hip.  No distal edema Specialty Comments:  No specialty comments available.  Imaging: No results found.   PMFS History: Patient Active Problem List   Diagnosis Date Noted  . Pain in left hip 12/17/2019  . Pain in left leg 11/06/2019  . Bilateral primary osteoarthritis of knee 06/11/2019  . Pain in left shoulder 05/22/2019  . Cellulitis of left upper extremity 10/15/2016  . 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 Diverticulosis of colon without hemorrhage 02/04/2015  .  Unspecified vitamin D deficiency 12/25/2013  . UTI (urinary tract infection) 09/29/2011  . 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 Atherosclerosis 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 04/20/2011  . SPINAL STENOSIS, LUMBAR 03/09/2010  . OTHER OSTEOPOROSIS 03/09/2010  . SCOLIOSIS, LUMBAR SPINE 03/09/2010  . MIGRAINE, OPHTHALMIC 10/29/2009  . LUMBAR RADICULOPATHY, ATYPICAL 10/29/2009  . POLYNEUROPATHY OTHER DISEASES CLASSIFIED ELSW 09/22/2009  . UNSPECIFIED PERIPHERAL VASCULAR DISEASE 09/22/2009  . ADENOMATOUS COLONIC POLYP 06/10/2009  . History of iron deficiency 05/06/2009  . CAROTID ARTERY DISEASE 07/28/2008  . Allergic rhinitis 03/20/2008  . DYSPHAGIA 03/12/2008  . GANGLION OF TENDON SHEATH 08/17/2007  . Hypothyroidism 11/15/2006  . Hyperlipidemia 11/07/2006  . ADVEF, DRUG/MEDICINAL/BIOLOGICAL SUBST NOS 11/07/2006  .  Essential hypertension 10/18/2006  . GERD 10/18/2006  . DIVERTICULOSIS, COLON 10/18/2006   Past Medical History:  Diagnosis Date  . Anemia   . Anxiety   . Diverticulitis   . Diverticulosis   . Gastroparesis   . GERD (gastroesophageal reflux disease)   . Glaucoma   . Hemorrhoids   . Hyperlipidemia   . Hypertension   . Hypertension   . IBS (irritable bowel syndrome)   . Rectal ulcer   . Segmental colitis ([place])   . Tubular adenoma of colon 05/2009    Family History  Problem Relation Age of Onset  . Diabetes Mother   . Heart disease Mother   . Hyperlipidemia Mother   . Hypertension Mother   . Anuerysm Father   . Heart disease Father   . Hypertension Father   . Migraines Daughter   . Kidney Stones Daughter   . Colon polyps Sister   . Lung cancer Sister   . Colon polyps Brother   . Diabetes Brother   . Hyperlipidemia Brother   . Hypertension Brother   . Colon polyps Daughter   . Breast cancer Daughter   . Colon cancer Maternal Aunt        Rectal cancer  . Cancer Daughter        salivary  . Colon cancer Paternal Grandmother        with possible stomach cancer  . Heart disease Brother   . Crohn's disease Neg Hx   . Pancreatic cancer Neg Hx     Past Surgical History:  Procedure Laterality Date  . APPENDECTOMY    . ARCUATE KERATECTOMY    . CATARACT EXTRACTION, BILATERAL  2015  . double pallital tori bone removed     . HERNIA REPAIR     umbilical  . KNEE SURGERY Bilateral   . morton's neuroma on rt foot and platar facial release    . MOUTH SURGERY     "bone shaved off roof of mouth"  . SALIVARY GLAND SURGERY     removal  . TONSILLECTOMY     Social History   Occupational History  . Occupation: Glass blower/designer    Comment: retired  Tobacco Use  . Smoking status: Never Smoker  . Smokeless tobacco: Never Used  Vaping Use  . Vaping Use: Never used  Substance and Sexual Activity  . Alcohol use: Yes    Alcohol/week: 0.0 standard drinks    Comment: rare  .  Drug use: No  . Sexual activity: Yes

## 2019-12-25 ENCOUNTER — Telehealth: Payer: Self-pay | Admitting: Family Medicine

## 2019-12-25 NOTE — Progress Notes (Signed)
  Chronic Care Management   Outreach Note  12/25/2019 Name: Rebecca [name] MRN: [id number] DOB: 1932/06/29  Referred by: [name], MD Reason for referral : No chief complaint on file.   An unsuccessful telephone outreach was attempted today. The patient was referred to the pharmacist for assistance with care management and care coordination.   Follow Up Plan:   Carley Perdue [employer] Scheduler

## 2020-01-06 ENCOUNTER — Telehealth: Payer: Self-pay | Admitting: Family Medicine

## 2020-01-06 NOTE — Progress Notes (Signed)
  Chronic Care Management   Outreach Note  01/06/2020 Name: Rebecca [name] MRN: [id number] DOB: 1932/05/26  Referred by: [name], MD Reason for referral : No chief complaint on file.   A second unsuccessful telephone outreach was attempted today. The patient was referred to pharmacist for assistance with care management and care coordination.  Follow Up Plan:   Carley Perdue [employer] Scheduler

## 2020-01-07 ENCOUNTER — Telehealth: Payer: Self-pay | Admitting: Family Medicine

## 2020-01-07 NOTE — Progress Notes (Signed)
  Chronic Care Management   Note  01/07/2020 Name: NIKELLE MALATESTA MRN: [id number] DOB: 03/25/1933  MARYALICE PASLEY is a 86 y.o. year old female who is a primary care patient of Burchette, Alinda Sierras, MD. I reached out to Joretta Bachelor by phone today in response to a referral sent by Ms. Jamice B Striplin's PCP, Burchette, Alinda Sierras, MD.   Ms. Cheslock was given information about Chronic Care Management services today including:  1. CCM service includes personalized support from designated clinical staff supervised by her physician, including individualized plan of care and coordination with other care providers 2. 24/7 contact phone numbers for assistance for urgent and routine care needs. 3. Service will only be billed when office clinical staff spend 20 minutes or more in a month to coordinate care. 4. Only one practitioner may furnish and bill the service in a calendar month. 5. The patient may stop CCM services at any time (effective at the end of the month) by phone call to the office staff.   Patient agreed to services and verbal consent obtained.   Follow up plan:   Carley Perdue [employer] Scheduler

## 2020-01-10 ENCOUNTER — Encounter: Payer: Self-pay | Admitting: Family Medicine

## 2020-01-16 ENCOUNTER — Encounter: Payer: Self-pay | Admitting: Orthopaedic Surgery

## 2020-01-16 ENCOUNTER — Other Ambulatory Visit: Payer: Self-pay

## 2020-01-16 ENCOUNTER — Ambulatory Visit (INDEPENDENT_AMBULATORY_CARE_PROVIDER_SITE_OTHER): Payer: Medicare Other | Admitting: Orthopaedic Surgery

## 2020-01-16 VITALS — Ht 62.0 in | Wt 122.0 lb

## 2020-01-16 DIAGNOSIS — M25552 Pain in left hip: Secondary | ICD-10-CM

## 2020-01-16 MED ORDER — LIDOCAINE HCL 1 % IJ SOLN
2.0000 mL | INTRAMUSCULAR | Status: AC | PRN
  Administered 2020-01-16: 2 mL

## 2020-01-16 MED ORDER — BUPIVACAINE HCL 0.5 % IJ SOLN
2.0000 mL | INTRAMUSCULAR | Status: AC | PRN
  Administered 2020-01-16: 2 mL via INTRA_ARTICULAR

## 2020-01-16 NOTE — Progress Notes (Signed)
Office Visit Note   Patient: Rebecca [name]           Date of Birth: 06-Feb-1933           MRN: [id number] Visit Date: 01/16/2020              Requested by: [name], MD [place],  [place] [id number] PCP: [name], MD   Assessment & Plan: Visit Diagnoses:  1. Pain in left hip     Plan: Is Stryker is been having trouble with her left hip for about 10 weeks.  She tried to prevent herself from falling by holding onto an object stressed" her hip.  I did obtain x-rays which were negative and a follow-up CT scan that revealed a nondisplaced fracture of the greater trochanter.  There was evidence of a small hematoma.  She still using a single crutch and notes that she still having some discomfort and only when she walks.  She is not had any trouble sitting or standing.  She is tender directly over the greater trochanter without any skin changes I have injected that with betamethasone today we will get a monitor her response.  If she is no better in the next 7 to 10 days I will order an open MRI scan of her pelvis.  There is a possibility that some of this pain may be related to her back.  She has had several MRI scans of the lumbar spine in the past the last of which was in 2017 demonstrating significant scoliosis, spinal and foraminal stenosis more so on the right than the left but there was an area of canal stenosis at L2-3 that could be causing her to have some pain in the area of her hip.  I repeated the films of her pelvis and specifically her left hip when she was here last month and still did not see any abnormality  Follow-Up Instructions: Return in about 1 month (around 02/16/2020).   Orders:  No orders of the defined types were placed in this encounter.  No orders of the defined types were placed in this encounter.     Procedures: Large Joint Inj: L greater trochanter on 01/16/2020 1:23 PM Indications: pain and diagnostic  evaluation Details: 25 G 1.5 in needle, lateral approach  Arthrogram: No  Medications: 2 mL lidocaine 1 %; 2 mL bupivacaine 0.5 %  30ml betamethasone Procedure, treatment alternatives, risks and benefits explained, specific risks discussed. Consent was given by the patient. Immediately prior to procedure a time out was called to verify the correct patient, procedure, equipment, support staff and site/side marked as required. Patient was prepped and draped in the usual sterile fashion.       Clinical Data: No additional findings.   Subjective: Chief Complaint  Patient presents with  . Left Hip - Follow-up  Approximately 10 weeks post injury and still having some pain in the area of her lateral left hip.  She is using 1 crutch.  She notes she feels better "today" but she still little unsteady.  She feels some of the problem might be that she is afraid and also her balance.  It is localized to the lateral aspect of her left hip.  There is no numbness or tingling  HPI  Review of Systems   Objective: Vital Signs: Ht 5\' 2"  (1.575 m)   Wt 122 lb (55.3 kg)   BMI 22.31 kg/m   Physical Exam Constitutional:  Appearance: She is well-developed.  Eyes:     Pupils: Pupils are equal, round, and reactive to light.  Pulmonary:     Effort: Pulmonary effort is normal.  Skin:    General: Skin is warm and dry.  Neurological:     Mental Status: She is alert and oriented to person, place, and time.  Psychiatric:        Behavior: Behavior normal.     Ortho Exam awake alert and oriented x3.  Comfortable sitting.  Actually walked fairly well with a single crutch in her right arm.  She is able to bear weight without too much discomfort.  She does have some tenderness directly over the greater trochanter of her left hip.  No skin changes or crepitation or induration or erythema.  Painless range of motion with her hip.  Specialty Comments:  No specialty comments available.  Imaging: No  results found.   PMFS History: Patient Active Problem List   Diagnosis Date Noted  . Pain in left hip 12/17/2019  . Pain in left leg 11/06/2019  . Bilateral primary osteoarthritis of knee 06/11/2019  . Pain in left shoulder 05/22/2019  . Cellulitis of left upper extremity 10/15/2016  . 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 Diverticulosis of colon without hemorrhage 02/04/2015  . Unspecified vitamin D deficiency 12/25/2013  . UTI (urinary tract infection) 09/29/2011  . 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 Atherosclerosis 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 04/20/2011  . SPINAL STENOSIS, LUMBAR 03/09/2010  . OTHER OSTEOPOROSIS 03/09/2010  . SCOLIOSIS, LUMBAR SPINE 03/09/2010  . MIGRAINE, OPHTHALMIC 10/29/2009  . LUMBAR RADICULOPATHY, ATYPICAL 10/29/2009  . POLYNEUROPATHY OTHER DISEASES CLASSIFIED ELSW 09/22/2009  . UNSPECIFIED PERIPHERAL VASCULAR DISEASE 09/22/2009  . ADENOMATOUS COLONIC POLYP 06/10/2009  . History of iron deficiency 05/06/2009  . CAROTID ARTERY DISEASE 07/28/2008  . Allergic rhinitis 03/20/2008  . DYSPHAGIA 03/12/2008  . GANGLION OF TENDON SHEATH 08/17/2007  . Hypothyroidism 11/15/2006  . Hyperlipidemia 11/07/2006  . ADVEF, DRUG/MEDICINAL/BIOLOGICAL SUBST NOS 11/07/2006  . Essential hypertension 10/18/2006  . GERD 10/18/2006  . DIVERTICULOSIS, COLON 10/18/2006   Past Medical History:  Diagnosis Date  . Anemia   . Anxiety   . Diverticulitis   . Diverticulosis   . Gastroparesis   . GERD (gastroesophageal reflux disease)   . Glaucoma   . Hemorrhoids   . Hyperlipidemia   . Hypertension   . Hypertension   . IBS (irritable bowel syndrome)   . Rectal ulcer   . Segmental colitis ([place])   . Tubular adenoma of colon 05/2009    Family History  Problem Relation Age of Onset  . Diabetes Mother   . Heart disease Mother   . Hyperlipidemia Mother   . Hypertension Mother   . Anuerysm Father   . Heart  disease Father   . Hypertension Father   . Migraines Daughter   . Kidney Stones Daughter   . Colon polyps Sister   . Lung cancer Sister   . Colon polyps Brother   . Diabetes Brother   . Hyperlipidemia Brother   . Hypertension Brother   . Colon polyps Daughter   . Breast cancer Daughter   . Colon cancer Maternal Aunt        Rectal cancer  . Cancer Daughter        salivary  . Colon cancer Paternal Grandmother        with possible stomach cancer  . Heart disease Brother   . Crohn's disease  Neg Hx   . Pancreatic cancer Neg Hx     Past Surgical History:  Procedure Laterality Date  . APPENDECTOMY    . ARCUATE KERATECTOMY    . CATARACT EXTRACTION, BILATERAL  2015  . double pallital tori bone removed     . HERNIA REPAIR     umbilical  . KNEE SURGERY Bilateral   . morton's neuroma on rt foot and platar facial release    . MOUTH SURGERY     "bone shaved off roof of mouth"  . SALIVARY GLAND SURGERY     removal  . TONSILLECTOMY     Social History   Occupational History  . Occupation: Glass blower/designer    Comment: retired  Tobacco Use  . Smoking status: Never Smoker  . Smokeless tobacco: Never Used  Vaping Use  . Vaping Use: Never used  Substance and Sexual Activity  . Alcohol use: Yes    Alcohol/week: 0.0 standard drinks    Comment: rare  . Drug use: No  . Sexual activity: Yes     Garald Balding, MD   Note - This record has been created using [employer].  Chart creation errors have been sought, but may not always  have been located. Such creation errors do not reflect on  the standard of medical care.

## 2020-01-16 NOTE — Progress Notes (Deleted)
Office Visit Note   Patient: Rebecca [name]           Date of Birth: 01-01-1933           MRN: [id number] Visit Date: 01/16/2020              Requested by: [name], MD [place],  [place] [address] PCP: [name], MD   Assessment & Plan: Visit Diagnoses: No diagnosis found.  Plan: ***  Follow-Up Instructions: No follow-ups on file.   Orders:  No orders of the defined types were placed in this encounter.  No orders of the defined types were placed in this encounter.     Procedures: No procedures performed   Clinical Data: No additional findings.   Subjective: Chief Complaint  Patient presents with  . Left Hip - Follow-up  Patient presents today for a one month follow up on her left hip. She is walking with one crutch. Patient states that she has not improved. Her pain is up and down. She has tried icing and Tylenol. She cannot seem to correlate her pain with anything she has done.   HPI  Review of Systems  Constitutional: Negative for fatigue.  HENT: Negative for ear pain.   Eyes: Negative for pain.  Respiratory: Negative for shortness of breath.   Cardiovascular: Negative for leg swelling.  Gastrointestinal: Negative for constipation and diarrhea.  Endocrine: Negative for cold intolerance and heat intolerance.  Genitourinary: Negative for difficulty urinating.  Musculoskeletal: Negative for joint swelling.  Skin: Negative for rash.  Allergic/Immunologic: Negative for food allergies.  Neurological: Negative for weakness.  Hematological: Does not bruise/bleed easily.  Psychiatric/Behavioral: Negative for sleep disturbance.     Objective: Vital Signs: Ht 5\' 2"  (1.575 m)   Wt 122 lb (55.3 kg)   BMI 22.31 kg/m   Physical Exam  Ortho Exam  Specialty Comments:  No specialty comments available.  Imaging: No results found.   PMFS History: Patient Active Problem List   Diagnosis Date Noted  . Pain in left  hip 12/17/2019  . Pain in left leg 11/06/2019  . Bilateral primary osteoarthritis of knee 06/11/2019  . Pain in left shoulder 05/22/2019  . Cellulitis of left upper extremity 10/15/2016  . 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 Diverticulosis of colon without hemorrhage 02/04/2015  . Unspecified vitamin D deficiency 12/25/2013  . UTI (urinary tract infection) 09/29/2011  . 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 Atherosclerosis 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 04/20/2011  . SPINAL STENOSIS, LUMBAR 03/09/2010  . OTHER OSTEOPOROSIS 03/09/2010  . SCOLIOSIS, LUMBAR SPINE 03/09/2010  . MIGRAINE, OPHTHALMIC 10/29/2009  . LUMBAR RADICULOPATHY, ATYPICAL 10/29/2009  . POLYNEUROPATHY OTHER DISEASES CLASSIFIED ELSW 09/22/2009  . UNSPECIFIED PERIPHERAL VASCULAR DISEASE 09/22/2009  . ADENOMATOUS COLONIC POLYP 06/10/2009  . History of iron deficiency 05/06/2009  . CAROTID ARTERY DISEASE 07/28/2008  . Allergic rhinitis 03/20/2008  . DYSPHAGIA 03/12/2008  . GANGLION OF TENDON SHEATH 08/17/2007  . Hypothyroidism 11/15/2006  . Hyperlipidemia 11/07/2006  . ADVEF, DRUG/MEDICINAL/BIOLOGICAL SUBST NOS 11/07/2006  . Essential hypertension 10/18/2006  . GERD 10/18/2006  . DIVERTICULOSIS, COLON 10/18/2006   Past Medical History:  Diagnosis Date  . Anemia   . Anxiety   . Diverticulitis   . Diverticulosis   . Gastroparesis   . GERD (gastroesophageal reflux disease)   . Glaucoma   . Hemorrhoids   . Hyperlipidemia   . Hypertension   . Hypertension   .  IBS (irritable bowel syndrome)   . Rectal ulcer   . Segmental colitis ([place])   . Tubular adenoma of colon 05/2009    Family History  Problem Relation Age of Onset  . Diabetes Mother   . Heart disease Mother   . Hyperlipidemia Mother   . Hypertension Mother   . Anuerysm Father   . Heart disease Father   . Hypertension Father   . Migraines Daughter   . Kidney Stones Daughter   . Colon  polyps Sister   . Lung cancer Sister   . Colon polyps Brother   . Diabetes Brother   . Hyperlipidemia Brother   . Hypertension Brother   . Colon polyps Daughter   . Breast cancer Daughter   . Colon cancer Maternal Aunt        Rectal cancer  . Cancer Daughter        salivary  . Colon cancer Paternal Grandmother        with possible stomach cancer  . Heart disease Brother   . Crohn's disease Neg Hx   . Pancreatic cancer Neg Hx     Past Surgical History:  Procedure Laterality Date  . APPENDECTOMY    . ARCUATE KERATECTOMY    . CATARACT EXTRACTION, BILATERAL  2015  . double pallital tori bone removed     . HERNIA REPAIR     umbilical  . KNEE SURGERY Bilateral   . morton's neuroma on rt foot and platar facial release    . MOUTH SURGERY     "bone shaved off roof of mouth"  . SALIVARY GLAND SURGERY     removal  . TONSILLECTOMY     Social History   Occupational History  . Occupation: Glass blower/designer    Comment: retired  Tobacco Use  . Smoking status: Never Smoker  . Smokeless tobacco: Never Used  Vaping Use  . Vaping Use: Never used  Substance and Sexual Activity  . Alcohol use: Yes    Alcohol/week: 0.0 standard drinks    Comment: rare  . Drug use: No  . Sexual activity: Yes

## 2020-01-17 DIAGNOSIS — Z23 Encounter for immunization: Secondary | ICD-10-CM | POA: Diagnosis not present

## 2020-01-20 ENCOUNTER — Other Ambulatory Visit: Payer: Self-pay | Admitting: Family Medicine

## 2020-01-24 ENCOUNTER — Telehealth: Payer: Self-pay | Admitting: Family Medicine

## 2020-01-24 ENCOUNTER — Telehealth: Payer: Self-pay | Admitting: Orthopaedic Surgery

## 2020-01-24 NOTE — Telephone Encounter (Signed)
Patient called requesting a call back from [place]. Patient did not disclose nature of her call. Patient phone number is [phone].

## 2020-01-24 NOTE — Telephone Encounter (Signed)
I tried calling pt to help. She again said she would rather talk to dr. Durward Fortes or Beverely Risen. She was ok to wait until Monday

## 2020-01-24 NOTE — Telephone Encounter (Signed)
Patient called wanting to know if she should go get a COVID test or just wait it out because she woke up with a head cold but did not want to schedule a virtual so I transferred her to nurse triage.  Waiting triage notes

## 2020-01-25 DIAGNOSIS — Z20822 Contact with and (suspected) exposure to covid-19: Secondary | ICD-10-CM | POA: Diagnosis not present

## 2020-01-28 NOTE — Telephone Encounter (Signed)
Spoke with patient. She said that the injection in her hip did help. She has been walking with the assistance of a crutch for over two months. She said that she is trying to walk without one now and trying to regain her strength in that leg. She wanted to know if she could get PT if needed. I told her that I did not see that being a problem. She will call us back if she feels that she needs physical therapy or not progressing.

## 2020-01-29 ENCOUNTER — Telehealth (INDEPENDENT_AMBULATORY_CARE_PROVIDER_SITE_OTHER): Payer: Medicare Other | Admitting: Internal Medicine

## 2020-01-29 DIAGNOSIS — J329 Chronic sinusitis, unspecified: Secondary | ICD-10-CM

## 2020-01-29 DIAGNOSIS — J310 Chronic rhinitis: Secondary | ICD-10-CM

## 2020-01-29 DIAGNOSIS — J069 Acute upper respiratory infection, unspecified: Secondary | ICD-10-CM

## 2020-01-29 MED ORDER — DOXYCYCLINE HYCLATE 100 MG PO TABS: 100.0000 mg | ORAL_TABLET | Freq: Two times a day (BID) | ORAL | 0 refills | Status: DC

## 2020-01-29 NOTE — Progress Notes (Signed)
   Virtual Visit via Telephone Note  I connected with@ on 01/29/20 at 11:30 AM EDT by telephone and verified that I am speaking with the correct person using two identifiers.   I discussed the limitations, risks, security and privacy concerns of performing an evaluation and management service by telephone and the limited availability of in person appointments. tThere may be a patient responsible charge related to this service. The patient expressed understanding and agreed to proceed.  Location patient: home Location provider: work  office Participants present for the call: patient, provider Patient did not have a visit in the prior 7 days to address this/these issue(s). PCP appt NA  History of Present Illness: Rebecca [name]  Presents for visit for upper  resp congestion like a bad cold but not getting better and the worse every had with copious  Green yellow and now bloody nasal dc . She had a negative covid test  3-4 days ago .  And has been immunized .  No hx of underlying  Sinus hx and no sig cough lower chest sx   No fever chills noted    Observations/Objective: Patient sounds personable and well on the phone. Sounds congested no coughing  I do not appreciate any SOB. Speech and thought processing are grossly intact. Patient reported vitals:   Assessment and Plan: Protracted URI  Rhinosinusitis - assumed bacterial secondary "worst cold ever had"   Follow Up Instructions: dsic options cont saline  And add antibiotic  If  persistent or progressive    Sent in doxycycline  And use saline . Fu alarm sx or if not improved after weekend    [id number] 5-10 [id number] 11-20 [id number] 21-30 I did not refer this patient for an OV in the next 24 hours for this/these issue(s).  I discussed the assessment and treatment plan with the patient. The patient was provided an opportunity to ask questions and answered. The patient agreed with the plan and demonstrated an understanding of the  instructions.   The patient was advised to call back or seek an in-person evaluation if the symptoms worsen or if the condition fails to improve as anticipated.  I provided 12  minutes of non-face-to-face time during this encounter. Return if symptoms worsen or fail to improve as expected.  Shanon Ace, MD

## 2020-02-04 ENCOUNTER — Telehealth: Payer: Self-pay | Admitting: Orthopaedic Surgery

## 2020-02-04 ENCOUNTER — Other Ambulatory Visit: Payer: Self-pay

## 2020-02-04 DIAGNOSIS — M79605 Pain in left leg: Secondary | ICD-10-CM

## 2020-02-04 NOTE — Telephone Encounter (Signed)
Pt called asking to speak with Dr. Rudene Anda nurse.  Regarding her hip she states she talked to you last week and said she would call back about this week.

## 2020-02-04 NOTE — Telephone Encounter (Signed)
Spoke with patient. She is having a difficult time ambulating without a crutch. Dr.Whitfield wants her to attend PT for strengthening. Order has been placed.

## 2020-02-10 DIAGNOSIS — Z23 Encounter for immunization: Secondary | ICD-10-CM | POA: Diagnosis not present

## 2020-02-12 ENCOUNTER — Other Ambulatory Visit: Payer: Self-pay | Admitting: Family Medicine

## 2020-02-12 DIAGNOSIS — K2100 Gastro-esophageal reflux disease with esophagitis, without bleeding: Secondary | ICD-10-CM

## 2020-02-13 DIAGNOSIS — Z4689 Encounter for fitting and adjustment of other specified devices: Secondary | ICD-10-CM | POA: Diagnosis not present

## 2020-02-13 DIAGNOSIS — N8111 Cystocele, midline: Secondary | ICD-10-CM | POA: Diagnosis not present

## 2020-02-18 ENCOUNTER — Ambulatory Visit (INDEPENDENT_AMBULATORY_CARE_PROVIDER_SITE_OTHER): Payer: Medicare Other | Admitting: Orthopaedic Surgery

## 2020-02-18 ENCOUNTER — Ambulatory Visit (INDEPENDENT_AMBULATORY_CARE_PROVIDER_SITE_OTHER): Payer: Medicare Other

## 2020-02-18 ENCOUNTER — Telehealth: Payer: Self-pay | Admitting: Orthopaedic Surgery

## 2020-02-18 ENCOUNTER — Other Ambulatory Visit: Payer: Self-pay

## 2020-02-18 ENCOUNTER — Encounter: Payer: Self-pay | Admitting: Orthopaedic Surgery

## 2020-02-18 VITALS — Ht 62.0 in | Wt 122.0 lb

## 2020-02-18 DIAGNOSIS — M25552 Pain in left hip: Secondary | ICD-10-CM | POA: Diagnosis not present

## 2020-02-18 DIAGNOSIS — S72002G Fracture of unspecified part of neck of left femur, subsequent encounter for closed fracture with delayed healing: Secondary | ICD-10-CM | POA: Diagnosis not present

## 2020-02-18 NOTE — Telephone Encounter (Signed)
Spoke with patient. She said that she took her car to the dealership yesterday and had to wait. She sat in a comfy leather chair, but upon getting home she had pain in her hip. She said that it has continued to worsen and is now using the crutch again. She wants to know if you need to see her again or if you want to order an MRI? Please advise.

## 2020-02-18 NOTE — Telephone Encounter (Signed)
Pt called to make Lauren aware that she has her MRI set for 03/12/20 and I did go ahead and schedule her MRI review appt for 03/17/20.

## 2020-02-18 NOTE — Telephone Encounter (Signed)
Needs a return visit

## 2020-02-18 NOTE — Telephone Encounter (Signed)
Called patient. Scheduled for this afternoon.

## 2020-02-18 NOTE — Progress Notes (Signed)
Office Visit Note   Patient: Rebecca [name]           Date of Birth: June 28, 1932           MRN: [id number] Visit Date: 02/18/2020              Requested by: [name], MD [place],  [place] [address] PCP: [name], MD   Assessment & Plan: Visit Diagnoses:  1. Pain in left hip   2. Closed fracture of neck of left femur with delayed healing, subsequent encounter     Plan: Rebecca [name] experienced acute onset of lateral left hip pain just over 3 months ago when she tried to stop herself from falling.  She apparently "thrusted" her left hip and attempt to break her fall.  Since that time she has had trouble with her left hip.  X-rays have been negative.  I did perform a CT scan revealing a nondisplaced fracture through the greater trochanter.  Over time she has improved to the point where she is having less pain and walking just with a single cane.  She had an exacerbation in the last several days without further injury or trauma.  The pain is still localized right at the very lateral aspect of her left hip.  She also has significant arthritis of her lumbar spine being followed by Dr. Ellene Route and has several levels of stenosis.  She receives cortisone injections through his office on an occasional basis.  She is not had any numbness or tingling.  Repeat films today were negative for any acute changes but there is irregularity over the greater trochanter including some areas of ectopic calcification possibly consistent with myositis.  I did inject her hip over a month ago at the greater trochanteric area with relief of her pain.  As her pain is so localized I am going to order an MRI of her left hip  Follow-Up Instructions: Return After MRI scan left hip.   Orders:  Orders Placed This Encounter  Procedures  . XR HIP UNILAT W OR W/O PELVIS 2-3 VIEWS LEFT  . MR Hip Left w/o contrast   No orders of the defined types were placed in this encounter.      Procedures: No procedures performed   Clinical Data: No additional findings.   Subjective: Chief Complaint  Patient presents with  . Left Hip - Pain  Patient presents today for recurrent left hip pain. She said that she was doing well until yesterday. She took her car to the dealership and sat in a comfy chair in the waiting room. Upon getting home she had developed pain in her left hip that has progressively worsened. She is now walking with a crutch again due to pain. Her pain is located in her lateral hip and thigh. She is taking motrin for pain.  HPI  Review of Systems   Objective: Vital Signs: Ht 5\' 2"  (1.575 m)   Wt 122 lb (55.3 kg)   BMI 22.31 kg/m   Physical Exam Constitutional:      Appearance: She is well-developed.  Eyes:     Pupils: Pupils are equal, round, and reactive to light.  Pulmonary:     Effort: Pulmonary effort is normal.  Skin:    General: Skin is warm and dry.  Neurological:     Mental Status: She is alert and oriented to person, place, and time.  Psychiatric:        Behavior: Behavior  normal.     Ortho Exam awake alert and oriented x3.  Comfortable sitting.  Does have some pain along the lateral aspect of her left hip with ambulation.  She uses a single crutch in her right upper extremity.  Painless range of motion of her hip both actively and passively at rest.  Does have some area of tenderness over the greater trochanter but no skin changes, erythema or ecchymosis.  No distal edema.  No percussible tenderness lumbar spine  Specialty Comments:  No specialty comments available.  Imaging: XR HIP UNILAT W OR W/O PELVIS 2-3 VIEWS LEFT  Result Date: 02/18/2020 AP pelvis and lateral of the left hip were obtained demonstrating decreased bone density.  No acute changes.  Patient is experiencing pain along the lateral aspect of the left hip.  There is some irregularity of the bony surface of the greater trochanter and several oval areas of ectopic  calcification possibly consistent with myositis.  No obvious fracture.  Limited AP view of the lower lumbar spine demonstrates significant osteoarthritis    PMFS History: Patient Active Problem List   Diagnosis Date Noted  . Pain in left hip 12/17/2019  . Pain in left leg 11/06/2019  . Bilateral primary osteoarthritis of knee 06/11/2019  . Pain in left shoulder 05/22/2019  . Cellulitis of left upper extremity 10/15/2016  . 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 Diverticulosis of colon without hemorrhage 02/04/2015  . Unspecified vitamin D deficiency 12/25/2013  . UTI (urinary tract infection) 09/29/2011  . 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 Atherosclerosis 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 04/20/2011  . SPINAL STENOSIS, LUMBAR 03/09/2010  . OTHER OSTEOPOROSIS 03/09/2010  . SCOLIOSIS, LUMBAR SPINE 03/09/2010  . MIGRAINE, OPHTHALMIC 10/29/2009  . LUMBAR RADICULOPATHY, ATYPICAL 10/29/2009  . POLYNEUROPATHY OTHER DISEASES CLASSIFIED ELSW 09/22/2009  . UNSPECIFIED PERIPHERAL VASCULAR DISEASE 09/22/2009  . ADENOMATOUS COLONIC POLYP 06/10/2009  . History of iron deficiency 05/06/2009  . CAROTID ARTERY DISEASE 07/28/2008  . Allergic rhinitis 03/20/2008  . DYSPHAGIA 03/12/2008  . GANGLION OF TENDON SHEATH 08/17/2007  . Hypothyroidism 11/15/2006  . Hyperlipidemia 11/07/2006  . ADVEF, DRUG/MEDICINAL/BIOLOGICAL SUBST NOS 11/07/2006  . Essential hypertension 10/18/2006  . GERD 10/18/2006  . DIVERTICULOSIS, COLON 10/18/2006   Past Medical History:  Diagnosis Date  . Anemia   . Anxiety   . Diverticulitis   . Diverticulosis   . Gastroparesis   . GERD (gastroesophageal reflux disease)   . Glaucoma   . Hemorrhoids   . Hyperlipidemia   . Hypertension   . Hypertension   . IBS (irritable bowel syndrome)   . Rectal ulcer   . Segmental colitis ([place])   . Tubular adenoma of colon 05/2009    Family History  Problem Relation Age  of Onset  . Diabetes Mother   . Heart disease Mother   . Hyperlipidemia Mother   . Hypertension Mother   . Anuerysm Father   . Heart disease Father   . Hypertension Father   . Migraines Daughter   . Kidney Stones Daughter   . Colon polyps Sister   . Lung cancer Sister   . Colon polyps Brother   . Diabetes Brother   . Hyperlipidemia Brother   . Hypertension Brother   . Colon polyps Daughter   . Breast cancer Daughter   . Colon cancer Maternal Aunt        Rectal cancer  . Cancer Daughter        salivary  . Colon  cancer Paternal Grandmother        with possible stomach cancer  . Heart disease Brother   . Crohn's disease Neg Hx   . Pancreatic cancer Neg Hx     Past Surgical History:  Procedure Laterality Date  . APPENDECTOMY    . ARCUATE KERATECTOMY    . CATARACT EXTRACTION, BILATERAL  2015  . double pallital tori bone removed     . HERNIA REPAIR     umbilical  . KNEE SURGERY Bilateral   . morton's neuroma on rt foot and platar facial release    . MOUTH SURGERY     "bone shaved off roof of mouth"  . SALIVARY GLAND SURGERY     removal  . TONSILLECTOMY     Social History   Occupational History  . Occupation: Glass blower/designer    Comment: retired  Tobacco Use  . Smoking status: Never Smoker  . Smokeless tobacco: Never Used  Vaping Use  . Vaping Use: Never used  Substance and Sexual Activity  . Alcohol use: Yes    Alcohol/week: 0.0 standard drinks    Comment: rare  . Drug use: No  . Sexual activity: Yes

## 2020-02-18 NOTE — Telephone Encounter (Signed)
Pt called stating she has a cracked a hip bone and the pain has restarted and so she would like a CB to advise what she should do.  [phone]

## 2020-02-19 ENCOUNTER — Ambulatory Visit: Payer: Medicare Other | Admitting: Family Medicine

## 2020-02-20 ENCOUNTER — Ambulatory Visit: Payer: Medicare Other | Attending: Orthopaedic Surgery | Admitting: Physical Therapy

## 2020-02-20 ENCOUNTER — Other Ambulatory Visit: Payer: Self-pay

## 2020-02-20 ENCOUNTER — Encounter: Payer: Self-pay | Admitting: Physical Therapy

## 2020-02-20 DIAGNOSIS — R262 Difficulty in walking, not elsewhere classified: Secondary | ICD-10-CM | POA: Insufficient documentation

## 2020-02-20 DIAGNOSIS — M25552 Pain in left hip: Secondary | ICD-10-CM | POA: Diagnosis not present

## 2020-02-20 DIAGNOSIS — M6281 Muscle weakness (generalized): Secondary | ICD-10-CM | POA: Diagnosis not present

## 2020-02-20 NOTE — Patient Instructions (Signed)
Access Code: [email or web address] URL: [email or web address] Date: 02/20/2020 Prepared by: Amador Cunas  Exercises Seated Long Arc Quad - 1 x daily - 7 x weekly - 3 sets - 10 reps Seated March - 1 x daily - 7 x weekly - 3 sets - 10 reps Seated Hip Abduction with Resistance - 1 x daily - 7 x weekly - 3 sets - 10 reps Seated Hip Adduction Squeeze with Ball - 1 x daily - 7 x weekly - 3 sets - 10 reps - 3 sec hold Sit to Stand - 1 x daily - 7 x weekly - 3 sets - 10 reps

## 2020-02-20 NOTE — Therapy (Signed)
[address] [place], [place], [address] Phone: [phone]   Fax:  [phone]  Physical Therapy Evaluation  Patient Details  Name: Rebecca [name] MRN: [id number] Date of Birth: February 07, 1933 Referring Provider (PT): Joni Fears   Encounter Date: 02/20/2020   PT End of Session - 02/20/20 1442    Visit Number 1    Date for PT Re-Evaluation 04/21/20    PT Start Time 1354    PT Stop Time 1439    PT Time Calculation (min) 45 min    Activity Tolerance Patient tolerated treatment well    Behavior During Therapy [facility] for tasks assessed/performed           Past Medical History:  Diagnosis Date  . Anemia   . Anxiety   . Diverticulitis   . Diverticulosis   . Gastroparesis   . GERD (gastroesophageal reflux disease)   . Glaucoma   . Hemorrhoids   . Hyperlipidemia   . Hypertension   . Hypertension   . IBS (irritable bowel syndrome)   . Rectal ulcer   . Segmental colitis ([place])   . Tubular adenoma of colon 05/2009    Past Surgical History:  Procedure Laterality Date  . APPENDECTOMY    . ARCUATE KERATECTOMY    . CATARACT EXTRACTION, BILATERAL  2015  . double pallital tori bone removed     . HERNIA REPAIR     umbilical  . KNEE SURGERY Bilateral   . morton's neuroma on rt foot and platar facial release    . MOUTH SURGERY     "bone shaved off roof of mouth"  . SALIVARY GLAND SURGERY     removal  . TONSILLECTOMY      There were no vitals filed for this visit.    Subjective Assessment - 02/20/20 1356    Subjective Pt reports L hip pain following a fall in 11/2019 which resulted in a mild nondisplaced fracture of the greater trochanter with hematoma. Pt follow up xrays show no acute changes but potential calcification suggestive of myositis. Pt reports she has an MRI scheduled for 03/12/2020. Pt reports she has no WB or activity restrictions from MD. Pt has been walking with a single crutch since time of  incident; states she cannot walk normally without it and walking without crutch is painful.    Limitations Sitting;Standing;House hold activities    Diagnostic tests xrays; scheduled for MRI    Patient Stated Goals walk normally without crutch; reduce pain    Currently in Pain? No/denies    Pain Score 0-No pain    Pain Location Hip    Pain Orientation Left;Lateral    Pain Descriptors / Indicators Aching   describes as deep ache   Pain Type Acute pain    Pain Onset More than a month ago    Pain Frequency Intermittent    Aggravating Factors  walking without crutch, stairs    Pain Relieving Factors rest, medicine              [facility] PT Assessment - 02/20/20 0001      Assessment   Medical Diagnosis L hip pain s/p L hip fx    Onset Date/Surgical Date 11/06/19    Next MD Visit 03/17/2020   MRI scheduled for 03/12/2020   Prior Therapy PT for shoulder earlier this year      Precautions   Precautions None      Restrictions   Weight Bearing Restrictions No  Balance Screen   Has the patient fallen in the past 6 months Yes    How many times? 1    Has the patient had a decrease in activity level because of a fear of falling?  No    Is the patient reluctant to leave their home because of a fear of falling?  No      Home Environment   Additional Comments stairs to access garage; reports had difficulty with stairs prior to fx d/t knee pain      Prior Function   Level of Independence Independent    Vocation Retired      Functional Tests   Functional tests Sit to Stand      Sit to Stand   Comments fatigues quickly; knee pain with STS      Posture/Postural Control   Posture/Postural Control Postural limitations    Postural Limitations Rounded Shoulders;Forward head;Increased thoracic kyphosis;Weight shift right    Posture Comments weight shifted to RLE with gait      ROM / Strength   AROM / PROM / Strength AROM;Strength      AROM   Overall AROM Comments LE ROM WFL; mild  limitation in hip IR/ER B      Strength   Strength Assessment Site Hip;Knee    Right/Left Hip Right;Left    Right Hip Flexion 4+/5    Right Hip Extension 4/5    Right Hip ABduction 4/5    Left Hip Flexion 4-/5    Left Hip Extension 4-/5    Left Hip ABduction 4-/5    Right/Left Knee Right;Left    Right Knee Flexion 4+/5    Right Knee Extension 4+/5    Left Knee Flexion 4+/5    Left Knee Extension 4+/5      Flexibility   Soft Tissue Assessment /Muscle Length yes    Hamstrings tight B    Piriformis tight B      Palpation   Palpation comment mild tenderness to palpation over L greater trochanter and just distal       Special Tests    Special Tests Hip Special Tests    Hip Special Tests  Trendelenberg Test;Hip Scouring;Patrick (FABER) Test      Saralyn Pilar (FABER) Test   Findings Negative    Side Left      Trendelenburg Test   Findings Positive    Side Left      Hip Scouring   Findings Negative    Side Left      Ambulation/Gait   Ambulation/Gait Yes    Ambulation/Gait Assistance 5: Supervision    Ambulation/Gait Assistance Details occasional near LOB with turns with SPC or no A/D    Ambulation Distance (Feet) 50 Feet    Assistive device R Axillary Crutch    Gait Comments pt demos antalgic gait w/o AD with narrow BOS and occasional near LOB      Standardized Balance Assessment   Standardized Balance Assessment Timed Up and Go Test      Timed Up and Go Test   Normal TUG (seconds) 21.38    TUG Comments with crutch                      Objective measurements completed on examination: See above findings.       [facility] Adult PT Treatment/Exercise - 02/20/20 0001      Exercises   Exercises Knee/Hip      Knee/Hip Exercises: Seated   Long Arc Quad Both;1  set;10 reps    Ball Squeeze x10 3 sec hold    Marching Both;1 set;10 reps    Abduction/Adduction  Both;1 set;10 reps    Abd/Adduction Limitations red TB    Sit to Sand 1 set;10 reps;without UE support                   PT Education - 02/20/20 1442    Education Details Pt educated on POC and HEP    Person(s) Educated Patient    Methods Explanation;Demonstration;Handout    Comprehension Verbalized understanding;Returned demonstration            PT Short Term Goals - 02/20/20 1449      PT SHORT TERM GOAL #1   Title Pt will be independent with HEP    Time 2    Period Weeks    Status New    Target Date 03/05/20             PT Long Term Goals - 02/20/20 1449      PT LONG TERM GOAL #1   Title Pt will demo LLE MMT equivalent to RLE    Time 6    Period Weeks    Status New    Target Date 04/02/20      PT LONG TERM GOAL #2   Title Pt will demo gait with LRAD supervision assist and minimal gait deviations    Time 6    Period Weeks    Status New    Target Date 04/02/20      PT LONG TERM GOAL #3   Title Pt will report 50% reduction in L hip pain while walking    Time 6    Period Weeks    Status New    Target Date 04/02/20                  Plan - 02/20/20 1451    Clinical Impression Statement Pt presents to clinic with reports of L hip pain persisting following nondisplaced fracture of the L greater trochanter with hematoma 11/2019. Xrays show fx is healed but repeat films show there is irregularity over the greater trochanter including some areas of ectopic calcification possibly consistent with myositis. Pt has MRI scheduled 03/12/2020. Pt reports no WB restrictions or precautions per MD. In clinic, pt demos decreased L hip MMT compared to RLE, tendernes over L greater trochanter, instability with gait w/o AD, and antalgic gait on LLE. Gait with single crutch on R leaning to RLE; does demo greater stability using crutch than SPC. With [facility], pt required cuing for appropriate sequencing of LE/AD; work on this in future rx. Pt would benefit from skilled PT to address the above impairments.    Personal Factors and Comorbidities Comorbidity 2    Comorbidities HTN,  anemia    Examination-Activity Limitations Locomotion Level;Transfers;Stairs;Stand    Examination-Participation Restrictions Community Activity;Interpersonal Relationship    Stability/Clinical Decision Making Evolving/Moderate complexity    Clinical Decision Making Low    Rehab Potential Good    PT Frequency 2x / week    PT Duration 8 weeks    PT Treatment/Interventions ADLs/Self Care Home Management;Electrical Stimulation;Moist Heat;Iontophoresis 4mg /ml Dexamethasone;Gait training;Stair training;Functional mobility training;Therapeutic activities;Therapeutic exercise;Balance training;Neuromuscular re-education;Manual techniques;Patient/family education;Passive range of motion    PT Next Visit Plan gait training with SPC, LE strength/flexibility    PT Home Exercise Plan STS, LAQ, ball squeeze, hip abd with TB    Consulted and Agree with Plan of Care Patient  Patient will benefit from skilled therapeutic intervention in order to improve the following deficits and impairments:  Abnormal gait, Difficulty walking, Decreased endurance, Decreased activity tolerance, Pain, Decreased balance, Impaired flexibility, Decreased mobility, Decreased strength, Postural dysfunction  Visit Diagnosis: Pain in left hip  Muscle weakness (generalized)  Difficulty in walking, not elsewhere classified     Problem List Patient Active Problem List   Diagnosis Date Noted  . Pain in left hip 12/17/2019  . Pain in left leg 11/06/2019  . Bilateral primary osteoarthritis of knee 06/11/2019  . Pain in left shoulder 05/22/2019  . Cellulitis of left upper extremity 10/15/2016  . 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 Diverticulosis of colon without hemorrhage 02/04/2015  . Unspecified vitamin D deficiency 12/25/2013  . UTI (urinary tract infection) 09/29/2011  . 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 Atherosclerosis of native arteries of the extremities with intermittent  claudication 04/20/2011  . SPINAL STENOSIS, LUMBAR 03/09/2010  . OTHER OSTEOPOROSIS 03/09/2010  . SCOLIOSIS, LUMBAR SPINE 03/09/2010  . MIGRAINE, OPHTHALMIC 10/29/2009  . LUMBAR RADICULOPATHY, ATYPICAL 10/29/2009  . POLYNEUROPATHY OTHER DISEASES CLASSIFIED ELSW 09/22/2009  . UNSPECIFIED PERIPHERAL VASCULAR DISEASE 09/22/2009  . ADENOMATOUS COLONIC POLYP 06/10/2009  . History of iron deficiency 05/06/2009  . CAROTID ARTERY DISEASE 07/28/2008  . Allergic rhinitis 03/20/2008  . DYSPHAGIA 03/12/2008  . GANGLION OF TENDON SHEATH 08/17/2007  . Hypothyroidism 11/15/2006  . Hyperlipidemia 11/07/2006  . ADVEF, DRUG/MEDICINAL/BIOLOGICAL SUBST NOS 11/07/2006  . Essential hypertension 10/18/2006  . GERD 10/18/2006  . DIVERTICULOSIS, COLON 10/18/2006   Amador Cunas, PT, DPT Donald Prose Dexter Sauser 02/20/2020, 3:17 PM  [address] [place], [place], [address] Phone: [phone]   Fax:  [phone]  Name: GEANIE PACIFICO MRN: [id number] Date of Birth: Nov 11, 1932

## 2020-03-04 ENCOUNTER — Other Ambulatory Visit: Payer: Self-pay

## 2020-03-04 ENCOUNTER — Encounter: Payer: Self-pay | Admitting: Physical Therapy

## 2020-03-04 ENCOUNTER — Ambulatory Visit: Payer: Medicare Other | Attending: Orthopaedic Surgery | Admitting: Physical Therapy

## 2020-03-04 DIAGNOSIS — R262 Difficulty in walking, not elsewhere classified: Secondary | ICD-10-CM | POA: Diagnosis not present

## 2020-03-04 DIAGNOSIS — G8929 Other chronic pain: Secondary | ICD-10-CM | POA: Insufficient documentation

## 2020-03-04 DIAGNOSIS — M545 Low back pain, unspecified: Secondary | ICD-10-CM | POA: Diagnosis not present

## 2020-03-04 DIAGNOSIS — M25552 Pain in left hip: Secondary | ICD-10-CM | POA: Diagnosis not present

## 2020-03-04 DIAGNOSIS — M6281 Muscle weakness (generalized): Secondary | ICD-10-CM | POA: Insufficient documentation

## 2020-03-04 NOTE — Therapy (Signed)
[place] [place], [place], [address] Phone: [phone]   Fax:  [phone]  Physical Therapy Treatment  Patient Details  Name: Rebecca [name] MRN: [id number] Date of Birth: [birth year] Referring Provider (PT): Joni Fears   Encounter Date: 03/04/2020   PT End of Session - 03/04/20 1450    Visit Number 2    Date for PT Re-Evaluation 04/21/20    PT Start Time 3532    PT Stop Time 1438    PT Time Calculation (min) 42 min    Activity Tolerance Patient tolerated treatment well    Behavior During Therapy [facility] for tasks assessed/performed           Past Medical History:  Diagnosis Date  . Anemia   . Anxiety   . Diverticulitis   . Diverticulosis   . Gastroparesis   . GERD (gastroesophageal reflux disease)   . Glaucoma   . Hemorrhoids   . Hyperlipidemia   . Hypertension   . Hypertension   . IBS (irritable bowel syndrome)   . Rectal ulcer   . Segmental colitis ([place])   . Tubular adenoma of colon 05/2009    Past Surgical History:  Procedure Laterality Date  . APPENDECTOMY    . ARCUATE KERATECTOMY    . CATARACT EXTRACTION, BILATERAL  2015  . double pallital tori bone removed     . HERNIA REPAIR     umbilical  . KNEE SURGERY Bilateral   . morton's neuroma on rt foot and platar facial release    . MOUTH SURGERY     "bone shaved off roof of mouth"  . SALIVARY GLAND SURGERY     removal  . TONSILLECTOMY      There were no vitals filed for this visit.   Subjective Assessment - 03/04/20 1357    Subjective Pt reports slight improvement in L hip pain but an increase in LBP. States that she has not been using crutch around house; walks in using cane today but reports mostly walking with no AD. Still feels a little "wobbly."    Currently in Pain? Yes    Pain Score 3     Pain Location Back                             [facility] Adult PT Treatment/Exercise - 03/04/20 0001      High  Level Balance   High Level Balance Activities Side stepping    High Level Balance Comments sidestepping on foam, rhomberg on airex, sidestepping over obstacle      Knee/Hip Exercises: Aerobic   Nustep L4 x 6 min      Knee/Hip Exercises: Standing   Heel Raises Both;1 set;10 reps    Heel Raises Limitations 2#    Knee Flexion Both;1 set;10 reps    Knee Flexion Limitations 2#    Hip Abduction Both;1 set;10 reps    Abduction Limitations 2#    Hip Extension Both;1 set;10 reps    Extension Limitations 2#      Knee/Hip Exercises: Seated   Long Arc Quad Both;1 set;10 reps    Long Arc Quad Weight 2 lbs.    Ball Squeeze 2x10 3 sec hld    Marching Both;1 set;10 reps    Marching Weights 2 lbs.    Hamstring Curl Both;1 set;10 reps    Hamstring Limitations red tb    Sit to Sand 2 sets;10 reps;without  UE support   with 2# chest press                   PT Short Term Goals - 03/04/20 1451      PT SHORT TERM GOAL #1   Title Pt will be independent with HEP    Status Achieved             PT Long Term Goals - 02/20/20 1449      PT LONG TERM GOAL #1   Title Pt will demo LLE MMT equivalent to RLE    Time 6    Period Weeks    Status New    Target Date 04/02/20      PT LONG TERM GOAL #2   Title Pt will demo gait with LRAD supervision assist and minimal gait deviations    Time 6    Period Weeks    Status New    Target Date 04/02/20      PT LONG TERM GOAL #3   Title Pt will report 50% reduction in L hip pain while walking    Time 6    Period Weeks    Status New    Target Date 04/02/20                 Plan - 03/04/20 1450    Clinical Impression Statement Pt tolerated progression to TE well; some limitations in therex d/t R knee pain with weightbearing exercise. Pt demos significant balance difficulties with CGA for balance ex's and most LOB occurring posteriorly. Continue to progress LE strengthening while incoporating balance ex's.    PT Treatment/Interventions  ADLs/Self Care Home Management;Electrical Stimulation;Moist Heat;Iontophoresis 4mg /ml Dexamethasone;Gait training;Stair training;Functional mobility training;Therapeutic activities;Therapeutic exercise;Balance training;Neuromuscular re-education;Manual techniques;Patient/family education;Passive range of motion    PT Next Visit Plan gait training with SPC, LE strength/flexibility, incorporate balance ex's    Consulted and Agree with Plan of Care Patient           Patient will benefit from skilled therapeutic intervention in order to improve the following deficits and impairments:  Abnormal gait, Difficulty walking, Decreased endurance, Decreased activity tolerance, Pain, Decreased balance, Impaired flexibility, Decreased mobility, Decreased strength, Postural dysfunction  Visit Diagnosis: Pain in left hip  Muscle weakness (generalized)  Difficulty in walking, not elsewhere classified     Problem List Patient Active Problem List   Diagnosis Date Noted  . Pain in left hip 12/17/2019  . Pain in left leg 11/06/2019  . Bilateral primary osteoarthritis of knee 06/11/2019  . Pain in left shoulder 05/22/2019  . Cellulitis of left upper extremity 10/15/2016  . Segmental colitis without complication ([place]) 70/04/7492  . Diverticulosis of colon without hemorrhage 02/04/2015  . Unspecified vitamin D deficiency 12/25/2013  . UTI (urinary tract infection) 09/29/2011  . 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 Atherosclerosis 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 04/20/2011  . SPINAL STENOSIS, LUMBAR 03/09/2010  . OTHER OSTEOPOROSIS 03/09/2010  . SCOLIOSIS, LUMBAR SPINE 03/09/2010  . MIGRAINE, OPHTHALMIC 10/29/2009  . LUMBAR RADICULOPATHY, ATYPICAL 10/29/2009  . POLYNEUROPATHY OTHER DISEASES CLASSIFIED ELSW 09/22/2009  . UNSPECIFIED PERIPHERAL VASCULAR DISEASE 09/22/2009  . ADENOMATOUS COLONIC POLYP 06/10/2009  . History of iron deficiency 05/06/2009  . CAROTID  ARTERY DISEASE 07/28/2008  . Allergic rhinitis 03/20/2008  . DYSPHAGIA 03/12/2008  . GANGLION OF TENDON SHEATH 08/17/2007  . Hypothyroidism 11/15/2006  . Hyperlipidemia 11/07/2006  . ADVEF, DRUG/MEDICINAL/BIOLOGICAL SUBST NOS 11/07/2006  . Essential hypertension 10/18/2006  . GERD 10/18/2006  .  DIVERTICULOSIS, COLON 10/18/2006   Amador Cunas, PT, DPT Donald Prose Zaven Klemens 03/04/2020, 2:52 PM  [place]. [place], [place], [address] Phone: [phone]   Fax:  [phone]  Name: Rebecca [name] MRN: [id number] Date of Birth: May 27, 1932

## 2020-03-05 ENCOUNTER — Encounter: Payer: Self-pay | Admitting: Physical Therapy

## 2020-03-05 ENCOUNTER — Telehealth: Payer: Self-pay | Admitting: Pharmacist

## 2020-03-05 ENCOUNTER — Ambulatory Visit: Payer: Medicare Other | Admitting: Physical Therapy

## 2020-03-05 DIAGNOSIS — G8929 Other chronic pain: Secondary | ICD-10-CM

## 2020-03-05 DIAGNOSIS — M545 Low back pain, unspecified: Secondary | ICD-10-CM | POA: Diagnosis not present

## 2020-03-05 DIAGNOSIS — M25552 Pain in left hip: Secondary | ICD-10-CM

## 2020-03-05 DIAGNOSIS — R262 Difficulty in walking, not elsewhere classified: Secondary | ICD-10-CM

## 2020-03-05 DIAGNOSIS — M6281 Muscle weakness (generalized): Secondary | ICD-10-CM

## 2020-03-05 NOTE — Therapy (Signed)
[place]. [place], [place], [address] Phone: [phone]   Fax:  [phone]  Physical Therapy Treatment  Patient Details  Name: Rebecca [name] MRN: [id number] Date of Birth: 1932/12/10 Referring Provider (PT): Joni Fears   Encounter Date: 03/05/2020   PT End of Session - 03/05/20 1141    Visit Number 3    Date for PT Re-Evaluation 04/21/20    PT Start Time 59    PT Stop Time 1144    PT Time Calculation (min) 44 min    Activity Tolerance Patient tolerated treatment well    Behavior During Therapy [facility] for tasks assessed/performed           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[place])    Tubular adenoma of colon 05/2009    Past Surgical History:  Procedure Laterality Date   APPENDECTOMY     ARCUATE KERATECTOMY     CATARACT EXTRACTION, BILATERAL  2015   double pallital tori bone removed      HERNIA REPAIR     umbilical   KNEE SURGERY Bilateral    morton's neuroma on rt foot and platar facial release     MOUTH SURGERY     "bone shaved off roof of mouth"   SALIVARY GLAND SURGERY     removal   TONSILLECTOMY      There were no vitals filed for this visit.   Subjective Assessment - 03/05/20 1103    Subjective Pt reports feeling pretty good today; ambulates with cane. Slightly sore from yesterday.    Currently in Pain? Yes    Pain Score 2     Pain Location Back   and hip   Pain Orientation Left                             [facility] Adult PT Treatment/Exercise - 03/05/20 0001      Knee/Hip Exercises: Stretches   Passive Hamstring Stretch Both;4 reps;20 seconds    ITB Stretch  Both;4 reps;20 seconds    Piriformis Stretch Both;4 reps;20 seconds    Other Knee/Hip Stretches knee adductor stretch 4 reps x20 sec      Knee/Hip Exercises: Aerobic   Nustep L4 x 7 min    Other Aerobic UBE L1.5 3 fwd/3 bkwd      Knee/Hip Exercises: Seated   Long Arc Quad Both;1 set;10 reps    Long Arc Quad Weight 2 lbs.    Marching Both;1 set;10 reps    Marching Weights 2 lbs.      Knee/Hip Exercises: Supine   Bridges with Diona Foley Squeeze Both;1 set;10 reps    Bridges with Clamshell Both;1 set;10 reps   green TB   Straight Leg Raise with External Rotation Both;1 set;10 reps    Straight Leg Raise with External Rotation Limitations with hip abduction    Other Supine Knee/Hip Exercises clamshell supine with green TB x10 B      Manual Therapy   Manual Therapy Passive ROM    Passive ROM PROM to B hip all directions  PT Short Term Goals - 03/04/20 1451      PT SHORT TERM GOAL #1   Title Pt will be independent with HEP    Status Achieved             PT Long Term Goals - 03/05/20 1145      PT LONG TERM GOAL #1   Title Pt will demo LLE MMT equivalent to RLE    Time 6    Period Weeks    Status On-going      PT LONG TERM GOAL #2   Title Pt will demo gait with LRAD supervision assist and minimal gait deviations    Time 6    Period Weeks    Status On-going      PT LONG TERM GOAL #3   Title Pt will report 50% reduction in L hip pain while walking    Time 6    Period Weeks    Status Partially Met                 Plan - 03/05/20 1144    Clinical Impression Statement Pt demos increased soreness today from yesterday's tx session; more focus on manual stretching and passive hip mobility this rx. Pt reported relief with manual stretching. Cues for posture/form with seated rows and extensions. Continue with LE strengthening and incoporate balance.    PT Treatment/Interventions ADLs/Self Care Home Management;Electrical Stimulation;Moist  Heat;Iontophoresis 57m/ml Dexamethasone;Gait training;Stair training;Functional mobility training;Therapeutic activities;Therapeutic exercise;Balance training;Neuromuscular re-education;Manual techniques;Patient/family education;Passive range of motion    PT Next Visit Plan gait training with SPC, LE strength/flexibility, incorporate balance ex's    Consulted and Agree with Plan of Care Patient           Patient will benefit from skilled therapeutic intervention in order to improve the following deficits and impairments:  Abnormal gait, Difficulty walking, Decreased endurance, Decreased activity tolerance, Pain, Decreased balance, Impaired flexibility, Decreased mobility, Decreased strength, Postural dysfunction  Visit Diagnosis: Pain in left hip  Muscle weakness (generalized)  Difficulty in walking, not elsewhere classified  Chronic bilateral low back pain without sciatica     Problem List Patient Active Problem List   Diagnosis Date Noted   Pain in left hip 12/17/2019   Pain in left leg 11/06/2019   Bilateral primary osteoarthritis of knee 06/11/2019   Pain in left shoulder 05/22/2019   Cellulitis of left upper extremity 10/15/2016   Segmental colitis without complication ([place] 178/93/8101  Diverticulosis of colon without hemorrhage 02/04/2015   Unspecified vitamin D deficiency 12/25/2013   UTI (urinary tract infection) 09/29/2011   Sclerosing panniculitis 07/28/2011   Atherosclerosis 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 04/20/2011   SPINAL STENOSIS, LUMBAR 03/09/2010   OTHER OSTEOPOROSIS 03/09/2010   SCOLIOSIS, LUMBAR SPINE 03/09/2010   MIGRAINE, OPHTHALMIC 10/29/2009   LUMBAR RADICULOPATHY, ATYPICAL 10/29/2009   POLYNEUROPATHY OTHER DISEASES CLASSIFIED ELSW 09/22/2009   UNSPECIFIED PERIPHERAL VASCULAR DISEASE 09/22/2009   ADENOMATOUS COLONIC POLYP 06/10/2009   History of iron deficiency 05/06/2009   CAROTID ARTERY  DISEASE 07/28/2008   Allergic rhinitis 03/20/2008   DYSPHAGIA 03/12/2008   GANGLION OF TENDON SHEATH 08/17/2007   Hypothyroidism 11/15/2006   Hyperlipidemia 11/07/2006   ADVEF, DRUG/MEDICINAL/BIOLOGICAL SUBST NOS 11/07/2006   Essential hypertension 10/18/2006   GERD 10/18/2006   DIVERTICULOSIS, COLON 10/18/2006   AAmador Cunas PT, DPT ADonald ProseSugg 03/05/2020, 11:46 AM  [place] [address]Phone: [phone]  Fax:  [phone]  Name: Rebecca [name] MRN: [id number] Date of Birth: [birth year]

## 2020-03-05 NOTE — Chronic Care Management (AMB) (Signed)
Chronic Care Management Pharmacy Assistant   Name: Rebecca [name]  MRN: [id number] DOB: 24-Jan-1933  Reason for Encounter: Medication Review/Initial Questions for Pharmacist visit on 03-06-2020 Patient Questions: 1. Have you seen any other providers since your last visit?  Dawayne Patricia, PT Physical Therapy 2. Any changes in your medications or health? No 3. Any side effects from any medications? No 4. Do you have any symptoms or problems not managed by your medications? No 5. Any concerns about your health right now? No 6. Has your provider asked that you check blood pressure, blood sugar, or follow a special diet at home? No 7. Do you get any type of exercise regularly?  . She currently is doing physical therapy. 8. Can you think of a goal you would like to reach for your health? [name] Kitchen She would like for her left hip to continue to get better. 9. Is there anything that you would like to discuss during the appointment? No  The patient was asked to please bring medications, blood pressure/ blood sugar log, and supplements to her appointment.    PCP : [name], MD  Allergies:   Allergies  Allergen Reactions  . Levaquin [Levofloxacin] Other (See Comments)    Very nauseous and shakey  . Sulfamethoxazole-Trimethoprim Nausea Only    shakey  . Lactose Intolerance (Gi) Diarrhea and Other (See Comments)    Stomach hurts   . Tramadol Hcl Nausea Only and Palpitations  . Tramadol Hcl Nausea Only and Palpitations  . Augmentin [Amoxicillin-Pot Clavulanate] Diarrhea  . Ciprofloxacin Nausea Only    REACTION: Thrush, Nausea, Shakey  . Pneumococcal Vaccine Polyvalent     REACTION: RED AND RAISED RASH ON ARM  . Adhesive [Tape] Rash  . Codeine Nausea Only    Medications: Outpatient Encounter Medications as of 03/05/2020  Medication Sig  . ALPRAZolam (XANAX) 0.25 MG tablet TAKE 1 TABLET(0.25 MG) BY MOUTH AT BEDTIME AS NEEDED  . Calcium Carbonate-Vitamin D (CALCIUM-VITAMIN  D) 500-200 MG-UNIT per tablet Take 1 tablet by mouth 2 (two) times daily with a meal.    . Cholecalciferol (VITAMIN D3) 2000 UNITS TABS Take 1 tablet by mouth daily.   . diclofenac sodium (VOLTAREN) 1 % GEL Apply 2 g topically 4 (four) times daily.  [name] Kitchen doxycycline (VIBRA-TABS) 100 MG tablet Take 1 tablet (100 mg total) by mouth 2 (two) times daily. For sinusitis  . esomeprazole (NEXIUM) 20 MG capsule Take 20 mg by mouth 2 (two) times daily before a meal.  . gabapentin (NEURONTIN) 100 MG capsule TAKE 2 CAPSULES(200 MG) BY MOUTH AT BEDTIME  . levothyroxine (SYNTHROID) 88 MCG tablet TAKE 1 TABLET(88 MCG) BY MOUTH DAILY  . losartan (COZAAR) 100 MG tablet Take 1 tablet (100 mg total) by mouth daily.  . metoCLOPramide (REGLAN) 5 MG tablet TAKE 1 TABLET BY MOUTH TWICE DAILY WITH MEALS  . simvastatin (ZOCOR) 20 MG tablet Take 1 tablet (20 mg total) by mouth at bedtime.  . timolol (TIMOPTIC) 0.5 % ophthalmic solution INT 1 GTT IN OU BID  . vitamin B-12 (CYANOCOBALAMIN) 500 MCG tablet Take 500 mcg by mouth every morning.   . vitamin C (ASCORBIC ACID) 500 MG tablet Take 500 mg by mouth daily.  . vitamin E (VITAMIN E) 400 UNIT capsule Take 400 Units by mouth daily.  . Zoledronic Acid (RECLAST IV) Inject into the vein. Once per year.  . [DISCONTINUED] olmesartan (BENICAR) 20 MG tablet Take 20 mg by mouth daily.  No facility-administered encounter medications on file as of 03/05/2020.    Current Diagnosis: Patient Active Problem List   Diagnosis Date Noted  . Pain in left hip 12/17/2019  . Pain in left leg 11/06/2019  . Bilateral primary osteoarthritis of knee 06/11/2019  . Pain in left shoulder 05/22/2019  . Cellulitis of left upper extremity 10/15/2016  . Segmental colitis without complication ([place]) 88/91/6945  . Diverticulosis of colon without hemorrhage 02/04/2015  . Unspecified vitamin D deficiency 12/25/2013  . UTI (urinary tract infection) 09/29/2011  . Sclerosing panniculitis 07/28/2011    . Atherosclerosis 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 04/20/2011  . SPINAL STENOSIS, LUMBAR 03/09/2010  . OTHER OSTEOPOROSIS 03/09/2010  . SCOLIOSIS, LUMBAR SPINE 03/09/2010  . MIGRAINE, OPHTHALMIC 10/29/2009  . LUMBAR RADICULOPATHY, ATYPICAL 10/29/2009  . POLYNEUROPATHY OTHER DISEASES CLASSIFIED ELSW 09/22/2009  . UNSPECIFIED PERIPHERAL VASCULAR DISEASE 09/22/2009  . ADENOMATOUS COLONIC POLYP 06/10/2009  . History of iron deficiency 05/06/2009  . CAROTID ARTERY DISEASE 07/28/2008  . Allergic rhinitis 03/20/2008  . DYSPHAGIA 03/12/2008  . GANGLION OF TENDON SHEATH 08/17/2007  . Hypothyroidism 11/15/2006  . Hyperlipidemia 11/07/2006  . ADVEF, DRUG/MEDICINAL/BIOLOGICAL SUBST NOS 11/07/2006  . Essential hypertension 10/18/2006  . GERD 10/18/2006  . DIVERTICULOSIS, COLON 10/18/2006    Goals Addressed   None     Follow-Up:  Pharmacist Review   Maia Breslow, [place] Assistant [phone]

## 2020-03-06 ENCOUNTER — Ambulatory Visit: Payer: Medicare Other | Admitting: Pharmacist

## 2020-03-06 DIAGNOSIS — E785 Hyperlipidemia, unspecified: Secondary | ICD-10-CM

## 2020-03-06 DIAGNOSIS — I10 Essential (primary) hypertension: Secondary | ICD-10-CM

## 2020-03-06 NOTE — Chronic Care Management (AMB) (Signed)
Chronic Care Management Pharmacy  Name: Rebecca [name]  MRN: [id number] DOB: 1932/08/19  Initial Planning Appointment: completed 03/05/20  Initial Questions: 1. Have you seen any other providers since your last visit? n/a 2. Any changes in your medicines or health? No   Chief Complaint/ HPI  Rebecca [name],  86 y.o. , female presents for their Initial CCM visit with the clinical pharmacist via telephone due to COVID-19 Pandemic.  PCP : [name], MD  Their chronic conditions include: HTN, HLD, hypothyroidism, anxiety, osteoporosis, neuropathy, GERD  Office Visits: -01/29/20 Shanon Ace, MD: Patient presented with sinusitis. Prescribed doxycycline BID x 7 days.  -11/19/19 Ofilia Neas, LPN: Patient presented for medicare annual wellness exam.   -08/30/19 Carolann Littler, MD: Patient presented with UTI symptoms. Prescribed cephalexin TID.   -08/16/19  Carolann Littler, MD: Patient presented for chronic conditions follow up. Routine follow up in 6 months.  Consult Visit: -03/05/20 Amador Cunas, PT (outpatient rehab): Patient presented for PT treatment for pain in left hip.  -01/16/20 Joni Fears, MD (ortho): Patient presented for pain in left hip. Steroid injection given.   -09/24/19 Joni Fears, MD (ortho): Patient presented for pain in left hip.  Medications: Outpatient Encounter Medications as of 03/06/2020  Medication Sig  . ALPRAZolam (XANAX) 0.25 MG tablet TAKE 1 TABLET(0.25 MG) BY MOUTH AT BEDTIME AS NEEDED  . Calcium Carb-Cholecalciferol (CALCIUM-VITAMIN D) 600-400 MG-UNIT TABS Take 1 tablet by mouth daily.  . Cholecalciferol (VITAMIN D3) 2000 UNITS TABS Take 1 tablet by mouth daily.  . diclofenac sodium (VOLTAREN) 1 % GEL Apply 2 g topically 4 (four) times daily.  [name] Kitchen esomeprazole (NEXIUM) 20 MG capsule Take 20 mg by mouth 2 (two) times daily before a meal.  . gabapentin (NEURONTIN) 100 MG capsule TAKE 2 CAPSULES(200 MG) BY MOUTH AT BEDTIME (Patient  taking differently: Take 100 mg by mouth daily.)  . levothyroxine (SYNTHROID) 88 MCG tablet TAKE 1 TABLET(88 MCG) BY MOUTH DAILY  . metoCLOPramide (REGLAN) 5 MG tablet TAKE 1 TABLET BY MOUTH TWICE DAILY WITH MEALS  . timolol (TIMOPTIC) 0.5 % ophthalmic solution INT 1 GTT IN OU BID  . vitamin B-12 (CYANOCOBALAMIN) 500 MCG tablet Take 1,000 mcg by mouth every morning.  . vitamin C (ASCORBIC ACID) 500 MG tablet Take 500 mg by mouth daily.  . [DISCONTINUED] losartan (COZAAR) 100 MG tablet Take 1 tablet (100 mg total) by mouth daily.  . [DISCONTINUED] simvastatin (ZOCOR) 20 MG tablet Take 1 tablet (20 mg total) by mouth at bedtime.  . [DISCONTINUED] doxycycline (VIBRA-TABS) 100 MG tablet Take 1 tablet (100 mg total) by mouth 2 (two) times daily. For sinusitis  . [DISCONTINUED] olmesartan (BENICAR) 20 MG tablet Take 20 mg by mouth daily.    . [DISCONTINUED] vitamin E (VITAMIN E) 400 UNIT capsule Take 400 Units by mouth daily.  . [DISCONTINUED] Zoledronic Acid (RECLAST IV) Inject into the vein. Once per year.   No facility-administered encounter medications on file as of 03/06/2020.   Patient reports her biggest barrier right now is that she fractured her hip and this definitely limits her ability to move around. She is not doing as much shopping as she did before the pandemic as well.  Patient lives alone and all of her family is nearby. She got to see them all for Thanksgiving and she reports being friendly with her neighbors.  Patient does the cooking and cleaning. Patient picks up food about once a week. Patient cooks meat, vegetables or salad, and sometimes  a potato. Patient eats more chicken than beef. Patient does not eat a lot sweets and drinks mostly water.   Patient walks around the house for at least 20 minutes. Patient sleeps pretty good and sleeps about 6-8 hours per night. Patient feels rested and does not nap.  Patient denies any problems with her medications as she reports she has been  taking them for a long time.   Current Diagnosis/Assessment:  Goals Addressed            This Visit's Progress   . Pharmacy Care Plan       CARE PLAN ENTRY (see longitudinal plan of care for additional care plan information)  Current Barriers:  . Chronic Disease Management support, education, and care coordination needs related to Hypertension, Hyperlipidemia, GERD, and Hypothyroidism   Hypertension BP Readings from Last 3 Encounters:  03/20/20 120/60  08/30/19 140/80  08/16/19 138/82   . Pharmacist Clinical Goal(s): o Over the next 180 days, patient will work with PharmD and providers to maintain BP goal <140/90 . Current regimen:  o Losartan 100 mg 1 tablet daily . Interventions: o Discussed DASH eating plan recommendations: . Emphasizes vegetables, fruits, and whole-grains . Includes fat-free or low-fat dairy products, fish, poultry, beans, nuts, and vegetable oils . Limits foods that are high in saturated fat. These foods include fatty meats, full-fat dairy products, and tropical oils such as coconut, palm kernel, and palm oils. . Limits sugar-sweetened beverages and sweets . Limiting sodium intake to < 1500 mg/day o Discussed recommendations for moderate aerobic exercise for 150 minutes/week spread out over 5 days for heart healthy lifestyle . Patient self care activities - Over the next 180 days, patient will: o Check blood pressure a few times a month, document, and provide at future appointments o Ensure daily salt intake < 2300 mg/day  Hyperlipidemia Lab Results  Component Value Date/Time   LDLCALC 127 (H) 03/20/2020 11:21 AM   LDLDIRECT 126.0 02/13/2018 10:37 AM   . Pharmacist Clinical Goal(s): o Over the next 180 days, patient will work with PharmD and providers to achieve LDL goal < 100 . Current regimen:  o Simvastatin 20 mg 1 tablet at bedtime . Interventions: o Discussed lowering cholesterol through diet by: [name] Kitchen Limiting foods with cholesterol such as  liver and other organ meats, egg yolks, shrimp, and whole milk dairy products . Avoiding saturated fats and trans fats and incorporating healthier fats, such as lean meat, nuts, and unsaturated oils like canola and olive oils . Eating foods with soluble fiber such as whole-grain cereals such as oatmeal and oat bran, fruits such as apples, bananas, oranges, pears, and prunes, legumes such as kidney beans, lentils, chick peas, black-eyed peas, and lima beans, and green leafy vegetables . Limiting alcohol intake . Patient self care activities - Over the next 180 days, patient will: o Continue current medication  Hypothyroidism Lab Results  Component Value Date   TSH 0.73 03/20/2020   . Pharmacist Clinical Goal(s): o Over the next 180 days, patient will work with PharmD and providers to maintain TSH between 0.4 to 4.5 . Current regimen:  o Levothyroxine 88 mcg 1 tablet daily . Interventions: o We discussed consistent administration on an empty stomach with water prior to all other medications . Patient self care activities - Over the next 180 days, patient will: o Continue current medication  GERD . Pharmacist Clinical Goal(s) o Over the next 180 days, patient will work with PharmD and providers to manage symptoms of  heartburn . Current regimen:  . Esomeprazole 20 mg 1 capsule twice daily . Metoclopramide 5 mg 1 tablet daily . Interventions: o Discussed non-pharmacologic management of symptoms such as elevating the head of your bed, avoiding eating 2-3 hours before bed, avoiding triggering foods such as acidic, spicy, or fatty foods, eating smaller meals, and wearing clothes that are loose around the waist  . Patient self care activities - Over the next 180 days, patient will: o Continue current medication  Medication management . Pharmacist Clinical Goal(s): o Over the next 180 days, patient will work with PharmD and providers to maintain optimal medication adherence . Current  pharmacy: [facility] . Interventions o Comprehensive medication review performed. o Continue current medication management strategy . Patient self care activities - Over the next 180 days, patient will: o Take medications as prescribed o Report any questions or concerns to PharmD and/or provider(s)  Initial goal documentation         SDOH Interventions   Flowsheet Row Most Recent Value  SDOH Interventions   Financial Strain Interventions Intervention Not Indicated  Transportation Interventions Intervention Not Indicated      Hypertension  Denies dizziness/headaches or chest pain  BP goal is:  <140/90  Office blood pressures are  BP Readings from Last 3 Encounters:  03/20/20 120/60  08/30/19 140/80  08/16/19 138/82   Patient checks BP at home never Patient home BP readings are ranging: n/a  Patient has failed these meds in the past: none Patient is currently controlled on the following medications:  . Losartan 100 mg 1 tablet daily  We discussed diet and exercise extensively -DASH eating plan recommendations: . Emphasizes vegetables, fruits, and whole-grains . Includes fat-free or low-fat dairy products, fish, poultry, beans, nuts, and vegetable oils . Limits foods that are high in saturated fat. These foods include fatty meats, full-fat dairy products, and tropical oils such as coconut, palm kernel, and palm oils. . Limits sugar-sweetened beverages and sweets . Limiting sodium intake to < 1500 mg/day -Diet: patient uses a little bit of salt -Discussed recommendations for moderate aerobic exercise for 150 minutes/week spread out over 5 days for heart healthy lifestyle  Plan Patient will start checking blood pressure at home. Continue current medications     Hyperlipidemia   LDL goal < 100  Last lipids Lab Results  Component Value Date   CHOL 214 (H) 03/20/2020   HDL 51 03/20/2020   LDLCALC 127 (H) 03/20/2020   LDLDIRECT 126.0 02/13/2018   TRIG 225 (H)  03/20/2020   CHOLHDL 4.2 03/20/2020   Hepatic Function Latest Ref Rng & Units 03/20/2020 02/15/2019 02/13/2018  Total Protein 6.1 - 8.1 g/dL 6.3 6.0 6.1  Albumin 3.5 - 5.2 g/dL - 4.1 4.3  AST 10 - 35 U/L 18 19 17   ALT 6 - 29 U/L 10 22 16   Alk Phosphatase 39 - 117 U/L - 59 55  Total Bilirubin 0.2 - 1.2 mg/dL 0.6 0.8 0.7  Bilirubin, Direct 0.0 - 0.2 mg/dL 0.1 0.1 0.1     The ASCVD Risk score (Glenaire., et al., 2013) failed to calculate for the following reasons:   The 2013 ASCVD risk score is only valid for ages 97 to 66   Patient has failed these meds in past: none Patient is currently uncontrolled on the following medications:   Simvastatin 20 mg 1 tablet at bedtime  We discussed:  diet and exercise extensively  Lowering cholesterol through diet by: [name] Kitchen Limiting foods with cholesterol such as  liver and other organ meats, egg yolks, shrimp, and whole milk dairy products . Avoiding saturated fats and trans fats and incorporating healthier fats, such as lean meat, nuts, and unsaturated oils like canola and olive oils . Eating foods with soluble fiber such as whole-grain cereals such as oatmeal and oat bran, fruits such as apples, bananas, oranges, pears, and prunes, legumes such as kidney beans, lentils, chick peas, black-eyed peas, and lima beans, and green leafy vegetables . Limiting alcohol intake   Plan Consider dose increase of simvastatin to target LDL goal of < 100 Continue current medications  Hypothyroidism   Lab Results  Component Value Date/Time   TSH 0.73 03/20/2020 11:21 AM   TSH 2.24 08/16/2019 11:07 AM   FREET4 0.92 04/28/2011 12:44 PM    Patient has failed these meds in past: none Patient is currently controlled on the following medications:  . Levothyroxine 88 mcg 1 tablet daily  We discussed:  consistent administration on an empty stomach with water prior to all other medications  Plan  Continue current medications   Anxiety   Patient has failed  these meds in past: none Patient is currently controlled on the following medications:  . Alprazolam 0.25 mg 1 tablet at bedtime as needed   We discussed: taking alprazolam sparingly - patient sometimes takes half a tablet a week  Plan  Continue current medications   Osteoporosis   Last DEXA Scan: unable to access (completed with OBGYN)   T-Score femoral neck: n/a  T-Score total hip: n/a  T-Score lumbar spine: n/a  T-Score forearm radius: n/a  10-year probability of major osteoporotic fracture: n/a  10-year probability of hip fracture: n/a  Vit D, 25-Hydroxy  Date Value Ref Range Status  03/20/2020 45 30 - 100 ng/mL Final    Comment:    Vitamin D Status         25-OH Vitamin D: . Deficiency:                    <20 ng/mL Insufficiency:             20 - 29 ng/mL Optimal:                 > or = 30 ng/mL . For 25-OH Vitamin D testing on patients on  D2-supplementation and patients for whom quantitation  of D2 and D3 fractions is required, the Quest[employer]ureD(TM) 25-OH VIT D, (D2,D3), LC/MS/MS is recommended: order  code [id number] (patients >42yr). See Note 1 . Note 1 . For additional information, please refer to  http://education.Quest[facility].com/faq/FAQ199  (This link is being provided for informational/ educational purposes only.)      Patient is not a candidate for pharmacologic treatment (finished 5 years of bisphosphonate tx)  Patient has failed these meds in past: Reclast (finished 5 years of treatment) Patient is currently controlled on the following medications:  .[name] KitchenVitamin D 2000 units 1 tablet daily . Calcium carbonate-vit D 600-200 mg-unit 1 tablet 2 times daily  We discussed:  Recommend [id number] units of vitamin D daily. Recommend 1200 mg of calcium daily from dietary and supplemental sources. Recommend weight-bearing and muscle strengthening exercises for building and maintaining bone density.    Plan Recommend repeat vitamin D level - patient will  request with next OBGYN appt.  Continue current medications   Neuropathy   Patient has failed these meds in past: none Patient is currently controlled on the following medications:  . Gabapentin 100 mg 2 capsules at bedtime (  taking 1 at bedtime) . Steroid injections every 5 months  We discussed:  Patient reports drowsiness with gabapentin  Plan  Continue current medications   GERD  Patient denies reflux symptoms.   Patient has failed these meds in past: other PPIs Patient is currently controlled on the following medications:  . Esomeprazole 20 mg 1 capsule twice daily . Metoclopramide 5 mg 1 tablet daily (written as twice daily)  We discussed:  Non-pharmacologic management of symptoms such as elevating the head of your bed, avoiding eating 2-3 hours before bed, avoiding triggering foods such as acidic, spicy, or fatty foods, eating smaller meals, and wearing clothes that are loose around the waist  Plan Consider taper at follow up.  Continue current medications  Dry eye   Patient has failed these meds in past: none Patient is currently controlled on the following medications:   Timolol 0.5% ophthalmic solution 1 drop in both eyes twice daily  Dry eye drops - systane eye drops PRN  Plan  Continue current medications   Miscellaneous/OTC    Patient is currently on the following medications:  [name] Kitchen Vitamin B 12 500 mcg 1 tablets daily . Diclofenac gel 1% PRN . Vitamin C 500 mg 1 tablet daily  Plan  Continue current medications   Vaccines   Reviewed and discussed patient's vaccination history.    Immunization History  Administered Date(s) Administered  . Fluad Quad(high Dose 65+) 12/31/2018  . H1N1 06/02/2008  . Influenza Split 12/29/2010  . Influenza Whole 04/04/2001, 02/01/2007, 12/24/2007, 12/16/2008, 01/15/2009, 01/05/2010  . Influenza, High Dose Seasonal PF 12/31/2014, 01/01/2016, 01/11/2017, 01/02/2018  . Influenza,inj,Quad PF,6+ Mos 12/19/2012,  12/25/2013  . Influenza-Unspecified 02/10/2020  . PFIZER SARS-COV-2 Vaccination 05/06/2019, 05/27/2019, 02/10/2020  . Pneumococcal Polysaccharide-23 03/05/1999  . Td 04/05/1999, 07/28/2008  . Zoster 10/24/2007  . Zoster Recombinat (Shingrix) 08/04/2016, 10/12/2016     Plan  Recommended patient receive tetanus vaccine in office/at pharmacy.   Medication Management   Patient's preferred pharmacy is:  [facility] [phone] [place], [place] [address] [place] [address] Phone: [phone] Fax: [phone]  Uses pill box? No - in medicine cabinet Pt endorses 100% compliance  We discussed: Current pharmacy is preferred with insurance plan and patient is satisfied with pharmacy services  Plan  Continue current medication management strategy  Follow up: 6 month phone visit   Jeni Salles, PharmD [facility] [place] [phone]

## 2020-03-09 ENCOUNTER — Ambulatory Visit: Payer: Medicare Other | Admitting: Physical Therapy

## 2020-03-09 ENCOUNTER — Other Ambulatory Visit: Payer: Self-pay

## 2020-03-09 ENCOUNTER — Encounter: Payer: Self-pay | Admitting: Physical Therapy

## 2020-03-09 DIAGNOSIS — M6281 Muscle weakness (generalized): Secondary | ICD-10-CM | POA: Diagnosis not present

## 2020-03-09 DIAGNOSIS — M545 Low back pain, unspecified: Secondary | ICD-10-CM

## 2020-03-09 DIAGNOSIS — R262 Difficulty in walking, not elsewhere classified: Secondary | ICD-10-CM

## 2020-03-09 DIAGNOSIS — G8929 Other chronic pain: Secondary | ICD-10-CM | POA: Diagnosis not present

## 2020-03-09 DIAGNOSIS — M25552 Pain in left hip: Secondary | ICD-10-CM | POA: Diagnosis not present

## 2020-03-09 NOTE — Therapy (Signed)
[facility] [place], [place], [address] Phone: [phone]   Fax:  [phone]  Physical Therapy Treatment  Patient Details  Name: Rebecca [name] MRN: [id number] Date of Birth: Jun 26, 1932 Referring Provider (PT): Joni Fears   Encounter Date: 03/09/2020   PT End of Session - 03/09/20 1444    Visit Number 4    Date for PT Re-Evaluation 04/21/20    PT Start Time 0737    PT Stop Time 1443    PT Time Calculation (min) 45 min    Activity Tolerance Patient tolerated treatment well    Behavior During Therapy [facility] for tasks assessed/performed           Past Medical History:  Diagnosis Date  . Anemia   . Anxiety   . Diverticulitis   . Diverticulosis   . Gastroparesis   . GERD (gastroesophageal reflux disease)   . Glaucoma   . Hemorrhoids   . Hyperlipidemia   . Hypertension   . Hypertension   . IBS (irritable bowel syndrome)   . Rectal ulcer   . Segmental colitis ([place])   . Tubular adenoma of colon 05/2009    Past Surgical History:  Procedure Laterality Date  . APPENDECTOMY    . ARCUATE KERATECTOMY    . CATARACT EXTRACTION, BILATERAL  2015  . double pallital tori bone removed     . HERNIA REPAIR     umbilical  . KNEE SURGERY Bilateral   . morton's neuroma on rt foot and platar facial release    . MOUTH SURGERY     "bone shaved off roof of mouth"  . SALIVARY GLAND SURGERY     removal  . TONSILLECTOMY      There were no vitals filed for this visit.   Subjective Assessment - 03/09/20 1403    Subjective Pt states she was a little sore after last rx but feeling better now.    Currently in Pain? Yes    Pain Score 2     Pain Location Hip    Pain Orientation Left                             [facility] Adult PT Treatment/Exercise - 03/09/20 0001      High Level Balance   High Level Balance Comments tandem and semi tandem at counter; fwd marching and backward walking occasional  HH assist      Knee/Hip Exercises: Stretches   Passive Hamstring Stretch Both;4 reps;20 seconds    ITB Stretch Both;4 reps;20 seconds    Piriformis Stretch Both;4 reps;20 seconds    Other Knee/Hip Stretches knee adductor stretch 4 reps x20 sec      Knee/Hip Exercises: Aerobic   Nustep L4 x 6 min    Other Aerobic L2 2 min each      Knee/Hip Exercises: Seated   Long Arc Quad Both;1 set;10 reps    Long Arc Quad Weight 2 lbs.    Marching Both;1 set;10 reps    Marching Weights 2 lbs.      Manual Therapy   Manual Therapy Passive ROM    Passive ROM PROM to B hip all directions                    PT Short Term Goals - 03/04/20 1451      PT SHORT TERM GOAL #1   Title Pt will be independent with  HEP    Status Achieved             PT Long Term Goals - 03/05/20 1145      PT LONG TERM GOAL #1   Title Pt will demo LLE MMT equivalent to RLE    Time 6    Period Weeks    Status On-going      PT LONG TERM GOAL #2   Title Pt will demo gait with LRAD supervision assist and minimal gait deviations    Time 6    Period Weeks    Status On-going      PT LONG TERM GOAL #3   Title Pt will report 50% reduction in L hip pain while walking    Time 6    Period Weeks    Status Partially Met                 Plan - 03/09/20 1444    Clinical Impression Statement Pt demos instability with turns and decreased stance time on LLE. Progressed balance program and prescribed new balance HEP. Pt required occasional HH assist with backward walking and cues for increased step length. Pt has follow up with MD early next week. Continue progressing balance and strengthening ex's.    PT Treatment/Interventions ADLs/Self Care Home Management;Electrical Stimulation;Moist Heat;Iontophoresis 63m/ml Dexamethasone;Gait training;Stair training;Functional mobility training;Therapeutic activities;Therapeutic exercise;Balance training;Neuromuscular re-education;Manual techniques;Patient/family  education;Passive range of motion    PT Next Visit Plan gait training with SPC, LE strength/flexibility, incorporate balance ex's    Consulted and Agree with Plan of Care Patient           Patient will benefit from skilled therapeutic intervention in order to improve the following deficits and impairments:  Abnormal gait, Difficulty walking, Decreased endurance, Decreased activity tolerance, Pain, Decreased balance, Impaired flexibility, Decreased mobility, Decreased strength, Postural dysfunction  Visit Diagnosis: Pain in left hip  Muscle weakness (generalized)  Difficulty in walking, not elsewhere classified  Chronic bilateral low back pain without sciatica     Problem List Patient Active Problem List   Diagnosis Date Noted  . Pain in left hip 12/17/2019  . Pain in left leg 11/06/2019  . Bilateral primary osteoarthritis of knee 06/11/2019  . Pain in left shoulder 05/22/2019  . Cellulitis of left upper extremity 10/15/2016  . Segmental colitis [place] 139/43/2003 . Diverticulosis of colon without hemorrhage 02/04/2015  . Unspecified vitamin D deficiency 12/25/2013  . UTI (urinary tract infection) 09/29/2011  . 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 Atherosclerosis 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 04/20/2011  . SPINAL STENOSIS, LUMBAR 03/09/2010  . OTHER OSTEOPOROSIS 03/09/2010  . SCOLIOSIS, LUMBAR SPINE 03/09/2010  . MIGRAINE, OPHTHALMIC 10/29/2009  . LUMBAR RADICULOPATHY, ATYPICAL 10/29/2009  . POLYNEUROPATHY OTHER DISEASES CLASSIFIED ELSW 09/22/2009  . UNSPECIFIED PERIPHERAL VASCULAR DISEASE 09/22/2009  . ADENOMATOUS COLONIC POLYP 06/10/2009  . History of iron deficiency 05/06/2009  . CAROTID ARTERY DISEASE 07/28/2008  . Allergic rhinitis 03/20/2008  . DYSPHAGIA 03/12/2008  . GANGLION OF TENDON SHEATH 08/17/2007  . Hypothyroidism 11/15/2006  . Hyperlipidemia 11/07/2006  . ADVEF, DRUG/MEDICINAL/BIOLOGICAL SUBST  NOS 11/07/2006  . Essential hypertension 10/18/2006  . GERD 10/18/2006  . DIVERTICULOSIS, COLON 10/18/2006   AAmador Cunas PT, DPT ADonald ProseSugg 03/09/2020, 2:46 PM  [place] [address]Phone: [phone]  Fax:  [phone] Name: [name], 1934

## 2020-03-10 ENCOUNTER — Other Ambulatory Visit: Payer: Self-pay | Admitting: Family Medicine

## 2020-03-10 DIAGNOSIS — L239 Allergic contact dermatitis, unspecified cause: Secondary | ICD-10-CM | POA: Diagnosis not present

## 2020-03-10 DIAGNOSIS — D1801 Hemangioma of skin and subcutaneous tissue: Secondary | ICD-10-CM | POA: Diagnosis not present

## 2020-03-10 DIAGNOSIS — L821 Other seborrheic keratosis: Secondary | ICD-10-CM | POA: Diagnosis not present

## 2020-03-10 DIAGNOSIS — Z85828 Personal history of other malignant neoplasm of skin: Secondary | ICD-10-CM | POA: Diagnosis not present

## 2020-03-11 ENCOUNTER — Other Ambulatory Visit: Payer: Self-pay

## 2020-03-11 ENCOUNTER — Encounter: Payer: Self-pay | Admitting: Physical Therapy

## 2020-03-11 ENCOUNTER — Ambulatory Visit: Payer: Medicare Other | Admitting: Physical Therapy

## 2020-03-11 DIAGNOSIS — M545 Low back pain, unspecified: Secondary | ICD-10-CM

## 2020-03-11 DIAGNOSIS — M6281 Muscle weakness (generalized): Secondary | ICD-10-CM

## 2020-03-11 DIAGNOSIS — M25552 Pain in left hip: Secondary | ICD-10-CM

## 2020-03-11 DIAGNOSIS — G8929 Other chronic pain: Secondary | ICD-10-CM | POA: Diagnosis not present

## 2020-03-11 DIAGNOSIS — R262 Difficulty in walking, not elsewhere classified: Secondary | ICD-10-CM | POA: Diagnosis not present

## 2020-03-11 NOTE — Therapy (Signed)
[place] [place], [place], [address] Phone: [phone]   Fax:  [phone]  Physical Therapy Treatment  Patient Details  Name: Rebecca [name] MRN: [id number] Date of Birth: 03/22/1933 Referring Provider (PT): Joni Fears   Encounter Date: 03/11/2020   PT End of Session - 03/11/20 1444    Visit Number 5    Date for PT Re-Evaluation 04/21/20    PT Start Time 9163    PT Stop Time 1443    PT Time Calculation (min) 45 min    Activity Tolerance Patient tolerated treatment well    Behavior During Therapy [facility] for tasks assessed/performed           Past Medical History:  Diagnosis Date  . Anemia   . Anxiety   . Diverticulitis   . Diverticulosis   . Gastroparesis   . GERD (gastroesophageal reflux disease)   . Glaucoma   . Hemorrhoids   . Hyperlipidemia   . Hypertension   . Hypertension   . IBS (irritable bowel syndrome)   . Rectal ulcer   . Segmental colitis ([place])   . Tubular adenoma of colon 05/2009    Past Surgical History:  Procedure Laterality Date  . APPENDECTOMY    . ARCUATE KERATECTOMY    . CATARACT EXTRACTION, BILATERAL  2015  . double pallital tori bone removed     . HERNIA REPAIR     umbilical  . KNEE SURGERY Bilateral   . morton's neuroma on rt foot and platar facial release    . MOUTH SURGERY     "bone shaved off roof of mouth"  . SALIVARY GLAND SURGERY     removal  . TONSILLECTOMY      There were no vitals filed for this visit.   Subjective Assessment - 03/11/20 1359    Subjective Pt states feeling wobbly yesterday when walking around; feeling concerned about progress and wanting to get better.    Limitations Sitting    Currently in Pain? Yes    Pain Score 0-No pain    Pain Location Hip    Pain Orientation Left                             [facility] Adult PT Treatment/Exercise - 03/11/20 0001      High Level Balance   High Level Balance Activities  Side stepping;Backward walking;Tandem walking;Marching forwards;Marching backwards    High Level Balance Comments sidestepping on foam      Knee/Hip Exercises: Aerobic   Nustep L4 x 6 min    Other Aerobic L2 2 min each      Knee/Hip Exercises: Standing   Forward Step Up Both;2 sets;10 reps;Hand Hold: 0;Step Height: 4"    Other Standing Knee Exercises alt step taps no HH 6" stair x10 B      Knee/Hip Exercises: Seated   Long Arc Quad Both;1 set;10 reps    Long Arc Quad Weight 2 lbs.    Ball Squeeze 2x10 3 sec hld    Marching Both;1 set;10 reps    Marching Weights 2 lbs.                    PT Short Term Goals - 03/04/20 1451      PT SHORT TERM GOAL #1   Title Pt will be independent with HEP    Status Achieved  PT Long Term Goals - 03/05/20 1145      PT LONG TERM GOAL #1   Title Pt will demo LLE MMT equivalent to RLE    Time 6    Period Weeks    Status On-going      PT LONG TERM GOAL #2   Title Pt will demo gait with LRAD supervision assist and minimal gait deviations    Time 6    Period Weeks    Status On-going      PT LONG TERM GOAL #3   Title Pt will report 50% reduction in L hip pain while walking    Time 6    Period Weeks    Status Partially Met                 Plan - 03/11/20 1444    Clinical Impression Statement High level balance activities this rx including tandem walking, backwards walking, and sidestepping with occasional LOB. Pt demos decreased stance time on LLE and increased instability on LLE. Pt will follow up with MD early next week. Continue progression of balance/strength ex's and gait training.    PT Treatment/Interventions ADLs/Self Care Home Management;Electrical Stimulation;Moist Heat;Iontophoresis 93m/ml Dexamethasone;Gait training;Stair training;Functional mobility training;Therapeutic activities;Therapeutic exercise;Balance training;Neuromuscular re-education;Manual techniques;Patient/family education;Passive  range of motion    PT Next Visit Plan gait training with SPC, LE strength/flexibility, incorporate balance ex's    Consulted and Agree with Plan of Care Patient           Patient will benefit from skilled therapeutic intervention in order to improve the following deficits and impairments:  Abnormal gait, Difficulty walking, Decreased endurance, Decreased activity tolerance, Pain, Decreased balance, Impaired flexibility, Decreased mobility, Decreased strength, Postural dysfunction  Visit Diagnosis: Pain in left hip  Muscle weakness (generalized)  Difficulty in walking, not elsewhere classified  Chronic bilateral low back pain without sciatica     Problem List Patient Active Problem List   Diagnosis Date Noted  . Pain in left hip 12/17/2019  . Pain in left leg 11/06/2019  . Bilateral primary osteoarthritis of knee 06/11/2019  . Pain in left shoulder 05/22/2019  . Cellulitis of left upper extremity 10/15/2016  . Segmental colitis without complication ([place] 136/09/7701 . Diverticulosis of colon without hemorrhage 02/04/2015  . Unspecified vitamin D deficiency 12/25/2013  . UTI (urinary tract infection) 09/29/2011  . 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 Atherosclerosis 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 04/20/2011  . SPINAL STENOSIS, LUMBAR 03/09/2010  . OTHER OSTEOPOROSIS 03/09/2010  . SCOLIOSIS, LUMBAR SPINE 03/09/2010  . MIGRAINE, OPHTHALMIC 10/29/2009  . LUMBAR RADICULOPATHY, ATYPICAL 10/29/2009  . POLYNEUROPATHY OTHER DISEASES CLASSIFIED ELSW 09/22/2009  . UNSPECIFIED PERIPHERAL VASCULAR DISEASE 09/22/2009  . ADENOMATOUS COLONIC POLYP 06/10/2009  . History of iron deficiency 05/06/2009  . CAROTID ARTERY DISEASE 07/28/2008  . Allergic rhinitis 03/20/2008  . DYSPHAGIA 03/12/2008  . GANGLION OF TENDON SHEATH 08/17/2007  . Hypothyroidism 11/15/2006  . Hyperlipidemia 11/07/2006  . ADVEF, DRUG/MEDICINAL/BIOLOGICAL SUBST NOS 11/07/2006   . Essential hypertension 10/18/2006  . GERD 10/18/2006  . DIVERTICULOSIS, COLON 10/18/2006   AAmador Cunas PT, DPT ADonald ProseSugg 03/11/2020, 2:47 PM  [place] [address]Phone: [phone]  Fax:  [phone] Name: Rebecca SASSIMRN: [id number] of Birth: [birth year]

## 2020-03-12 ENCOUNTER — Ambulatory Visit
Admission: RE | Admit: 2020-03-12 | Discharge: 2020-03-12 | Disposition: A | Discharge status: Home / Self Care | Payer: Medicare Other | Source: Ambulatory Visit | Attending: Orthopaedic Surgery | Admitting: Orthopaedic Surgery

## 2020-03-12 DIAGNOSIS — M7062 Trochanteric bursitis, left hip: Secondary | ICD-10-CM | POA: Diagnosis not present

## 2020-03-12 DIAGNOSIS — S72002G Fracture of unspecified part of neck of left femur, subsequent encounter for closed fracture with delayed healing: Secondary | ICD-10-CM

## 2020-03-12 DIAGNOSIS — M7602 Gluteal tendinitis, left hip: Secondary | ICD-10-CM | POA: Diagnosis not present

## 2020-03-12 DIAGNOSIS — M1612 Unilateral primary osteoarthritis, left hip: Secondary | ICD-10-CM | POA: Diagnosis not present

## 2020-03-12 DIAGNOSIS — S76312A Strain of muscle, fascia and tendon of the posterior muscle group at thigh level, left thigh, initial encounter: Secondary | ICD-10-CM | POA: Diagnosis not present

## 2020-03-16 ENCOUNTER — Other Ambulatory Visit: Payer: Self-pay | Admitting: Family Medicine

## 2020-03-17 ENCOUNTER — Ambulatory Visit (INDEPENDENT_AMBULATORY_CARE_PROVIDER_SITE_OTHER): Payer: Medicare Other | Admitting: Orthopaedic Surgery

## 2020-03-17 ENCOUNTER — Encounter: Payer: Self-pay | Admitting: Orthopaedic Surgery

## 2020-03-17 ENCOUNTER — Other Ambulatory Visit: Payer: Self-pay

## 2020-03-17 VITALS — Ht 62.0 in | Wt 122.0 lb

## 2020-03-17 DIAGNOSIS — M25552 Pain in left hip: Secondary | ICD-10-CM

## 2020-03-17 NOTE — Progress Notes (Signed)
Office Visit Note   Patient: Rebecca [name]           Date of Birth: [birth year]           MRN: [id number] Visit Date: 03/17/2020              Requested by: [name], MD [place],  [place] [id number] PCP: [name], MD   Assessment & Plan: Visit Diagnoses:  1. Pain in left hip     Plan: [name] had an MRI scan of her left hip.  Both hips were normally located.  There was no evidence of any worrisome bone lesions or obvious fractures.  She had a prior greater trochanteric fracture that appears to have healed.  She had significant left-sided peritendinitis and partial-thickness tearing of the gluteal tendons from their femoral attachment that likely explain the left hip pain.  There was mild peritendinitis on the right side without trochanteric bursitis.  He actually is feeling a little better.  She notes she does not use any ambulatory aid around the house but when she is out and about she uses a cane.  She is receiving physical therapy and feels like it is made a big difference.  She does have an issue with her bowel.  Her exam was relatively benign today she did not have any pain with active motion of her hip and just mild tenderness over the greater trochanteric region.  We will continue with the therapy and the cane and I plan to see her back in a month  Follow-Up Instructions: Return in about 1 month (around 04/17/2020).   Orders:  No orders of the defined types were placed in this encounter.  No orders of the defined types were placed in this encounter.     Procedures: No procedures performed   Clinical Data: No additional findings.   Subjective: Chief Complaint  Patient presents with  . Left Hip - Follow-up    MRI review  Patient presents today for follow up on her left hip. She had an MRI performed on 03/12/2020. She is here today for those results.  Appears that she is improving as she does not use any ambulatory aid  around the house and only a cane when she is out and about.  She is going to physical therapy which she thinks is made a big difference  HPI  Review of Systems   Objective: Vital Signs: Ht 5\' 2"  (1.575 m)   Wt 122 lb (55.3 kg)   BMI 22.31 kg/m   Physical Exam Constitutional:      Appearance: She is well-developed and well-nourished.  HENT:     Mouth/Throat:     Mouth: Oropharynx is clear and moist.  Eyes:     Extraocular Movements: EOM normal.     Pupils: Pupils are equal, round, and reactive to light.  Pulmonary:     Effort: Pulmonary effort is normal.  Skin:    General: Skin is warm and dry.  Neurological:     Mental Status: She is alert and oriented to person, place, and time.  Psychiatric:        Mood and Affect: Mood and affect normal.        Behavior: Behavior normal.     Ortho Exam comfortable sitting.  No distress.  Painless active and passive range of motion of her left hip.  Some mild tenderness in the area of the greater trochanter.  Straight leg raise negative.  No percussible back pain.  Motor exam intact  Specialty Comments:  No specialty comments available.  Imaging: No results found.   PMFS History: Patient Active Problem List   Diagnosis Date Noted  . Pain in left hip 12/17/2019  . Pain in left leg 11/06/2019  . Bilateral primary osteoarthritis of knee 06/11/2019  . Pain in left shoulder 05/22/2019  . Cellulitis of left upper extremity 10/15/2016  . Segmental colitis without complication ([place]) 02/54/2706  . Diverticulosis of colon without hemorrhage 02/04/2015  . Unspecified vitamin D deficiency 12/25/2013  . UTI (urinary tract infection) 09/29/2011  . 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 Atherosclerosis 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 04/20/2011  . SPINAL STENOSIS, LUMBAR 03/09/2010  . OTHER OSTEOPOROSIS 03/09/2010  . SCOLIOSIS, LUMBAR SPINE 03/09/2010  . MIGRAINE, OPHTHALMIC 10/29/2009  . LUMBAR  RADICULOPATHY, ATYPICAL 10/29/2009  . POLYNEUROPATHY OTHER DISEASES CLASSIFIED ELSW 09/22/2009  . UNSPECIFIED PERIPHERAL VASCULAR DISEASE 09/22/2009  . ADENOMATOUS COLONIC POLYP 06/10/2009  . History of iron deficiency 05/06/2009  . CAROTID ARTERY DISEASE 07/28/2008  . Allergic rhinitis 03/20/2008  . DYSPHAGIA 03/12/2008  . GANGLION OF TENDON SHEATH 08/17/2007  . Hypothyroidism 11/15/2006  . Hyperlipidemia 11/07/2006  . ADVEF, DRUG/MEDICINAL/BIOLOGICAL SUBST NOS 11/07/2006  . Essential hypertension 10/18/2006  . GERD 10/18/2006  . DIVERTICULOSIS, COLON 10/18/2006   Past Medical History:  Diagnosis Date  . Anemia   . Anxiety   . Diverticulitis   . Diverticulosis   . Gastroparesis   . GERD (gastroesophageal reflux disease)   . Glaucoma   . Hemorrhoids   . Hyperlipidemia   . Hypertension   . Hypertension   . IBS (irritable bowel syndrome)   . Rectal ulcer   . Segmental colitis ([place])   . Tubular adenoma of colon 05/2009    Family History  Problem Relation Age of Onset  . Diabetes Mother   . Heart disease Mother   . Hyperlipidemia Mother   . Hypertension Mother   . Anuerysm Father   . Heart disease Father   . Hypertension Father   . Migraines Daughter   . Kidney Stones Daughter   . Colon polyps Sister   . Lung cancer Sister   . Colon polyps Brother   . Diabetes Brother   . Hyperlipidemia Brother   . Hypertension Brother   . Colon polyps Daughter   . Breast cancer Daughter   . Colon cancer Maternal Aunt        Rectal cancer  . Cancer Daughter        salivary  . Colon cancer Paternal Grandmother        with possible stomach cancer  . Heart disease Brother   . Crohn's disease Neg Hx   . Pancreatic cancer Neg Hx     Past Surgical History:  Procedure Laterality Date  . APPENDECTOMY    . ARCUATE KERATECTOMY    . CATARACT EXTRACTION, BILATERAL  2015  . double pallital tori bone removed     . HERNIA REPAIR     umbilical  . KNEE SURGERY Bilateral   . morton's  neuroma on rt foot and platar facial release    . MOUTH SURGERY     "bone shaved off roof of mouth"  . SALIVARY GLAND SURGERY     removal  . TONSILLECTOMY     Social History   Occupational History  . Occupation: Glass blower/designer    Comment: retired  Tobacco Use  . Smoking status: Never Smoker  .  Smokeless tobacco: Never Used  Vaping Use  . Vaping Use: Never used  Substance and Sexual Activity  . Alcohol use: Yes    Alcohol/week: 0.0 standard drinks    Comment: rare  . Drug use: No  . Sexual activity: Yes

## 2020-03-19 ENCOUNTER — Encounter: Payer: Self-pay | Admitting: Physical Therapy

## 2020-03-19 ENCOUNTER — Ambulatory Visit: Payer: Medicare Other | Admitting: Physical Therapy

## 2020-03-19 ENCOUNTER — Other Ambulatory Visit: Payer: Self-pay

## 2020-03-19 DIAGNOSIS — G8929 Other chronic pain: Secondary | ICD-10-CM | POA: Diagnosis not present

## 2020-03-19 DIAGNOSIS — M545 Low back pain, unspecified: Secondary | ICD-10-CM | POA: Diagnosis not present

## 2020-03-19 DIAGNOSIS — R262 Difficulty in walking, not elsewhere classified: Secondary | ICD-10-CM | POA: Diagnosis not present

## 2020-03-19 DIAGNOSIS — M6281 Muscle weakness (generalized): Secondary | ICD-10-CM

## 2020-03-19 DIAGNOSIS — M25552 Pain in left hip: Secondary | ICD-10-CM | POA: Diagnosis not present

## 2020-03-19 NOTE — Therapy (Signed)
[address] [place], [place], [address] Phone: [phone]   Fax:  [phone]  Physical Therapy Treatment  Patient Details  Name: Rebecca [name] MRN: [id number] Date of Birth: 06-Jan-1933 Referring Provider (PT): Joni Fears   Encounter Date: 03/19/2020   PT End of Session - 03/19/20 1610    Visit Number 6    Date for PT Re-Evaluation 04/21/20    PT Start Time 1528    PT Stop Time 1615    PT Time Calculation (min) 47 min    Activity Tolerance Patient tolerated treatment well    Behavior During Therapy [facility] for tasks assessed/performed           Past Medical History:  Diagnosis Date  . Anemia   . Anxiety   . Diverticulitis   . Diverticulosis   . Gastroparesis   . GERD (gastroesophageal reflux disease)   . Glaucoma   . Hemorrhoids   . Hyperlipidemia   . Hypertension   . Hypertension   . IBS (irritable bowel syndrome)   . Rectal ulcer   . Segmental colitis ([place])   . Tubular adenoma of colon 05/2009    Past Surgical History:  Procedure Laterality Date  . APPENDECTOMY    . ARCUATE KERATECTOMY    . CATARACT EXTRACTION, BILATERAL  2015  . double pallital tori bone removed     . HERNIA REPAIR     umbilical  . KNEE SURGERY Bilateral   . morton's neuroma on rt foot and platar facial release    . MOUTH SURGERY     "bone shaved off roof of mouth"  . SALIVARY GLAND SURGERY     removal  . TONSILLECTOMY      There were no vitals filed for this visit.   Subjective Assessment - 03/19/20 1606    Subjective Pt reports that R glute and radiating pain is bothering her today; asks to not do any stretches to RLE today    Currently in Pain? Yes    Pain Score 4     Pain Location Buttocks    Pain Orientation Right                             [facility] Adult PT Treatment/Exercise - 03/19/20 0001      High Level Balance   High Level Balance Activities Side stepping;Backward  walking;Tandem walking;Marching forwards;Marching backwards    High Level Balance Comments sidestepping on foam      Knee/Hip Exercises: Stretches   Gastroc Stretch Both;1 rep;20 seconds      Knee/Hip Exercises: Standing   Heel Raises Both;1 set;10 reps    Lateral Step Up Both;2 sets;10 reps;Step Height: 4"    Forward Step Up Both;2 sets;10 reps;Hand Hold: 0;Step Height: 4"    Other Standing Knee Exercises alt step taps no HH 6" stair x10 B      Modalities   Modalities Electrical Stimulation      Electrical Stimulation   Electrical Stimulation Location R glute    Electrical Stimulation Action IFC    Electrical Stimulation Parameters sitting    Electrical Stimulation Goals Pain                    PT Short Term Goals - 03/04/20 1451      PT SHORT TERM GOAL #1   Title Pt will be independent with HEP    Status Achieved  PT Long Term Goals - 03/05/20 1145      PT LONG TERM GOAL #1   Title Pt will demo LLE MMT equivalent to RLE    Time 6    Period Weeks    Status On-going      PT LONG TERM GOAL #2   Title Pt will demo gait with LRAD supervision assist and minimal gait deviations    Time 6    Period Weeks    Status On-going      PT LONG TERM GOAL #3   Title Pt will report 50% reduction in L hip pain while walking    Time 6    Period Weeks    Status Partially Met                 Plan - 03/19/20 1611    Clinical Impression Statement Pt recently had MRI at [facility] which revealed peritendinitis of R hip and partial gluteal tendon tears of L hip. Pt reports increased pain in R buttocks and down R leg today and requests no stretching to RLE. CGA for high level balance ex's with occasional LOB. Pt demos increased pain in RLE so applied estim at end for pain relief. Continue progression of balance/strength ex's.    PT Treatment/Interventions ADLs/Self Care Home Management;Electrical Stimulation;Moist Heat;Iontophoresis 62m/ml Dexamethasone;Gait  training;Stair training;Functional mobility training;Therapeutic activities;Therapeutic exercise;Balance training;Neuromuscular re-education;Manual techniques;Patient/family education;Passive range of motion    PT Next Visit Plan gait training with SPC, LE strength/flexibility, incorporate balance ex's    Consulted and Agree with Plan of Care Patient           Patient will benefit from skilled therapeutic intervention in order to improve the following deficits and impairments:  Abnormal gait,Difficulty walking,Decreased endurance,Decreased activity tolerance,Pain,Decreased balance,Impaired flexibility,Decreased mobility,Decreased strength,Postural dysfunction  Visit Diagnosis: Pain in left hip  Muscle weakness (generalized)  Difficulty in walking, not elsewhere classified  Chronic bilateral low back pain without sciatica     Problem List Patient Active Problem List   Diagnosis Date Noted  . Pain in left hip 12/17/2019  . Pain in left leg 11/06/2019  . Bilateral primary osteoarthritis of knee 06/11/2019  . Pain in left shoulder 05/22/2019  . Cellulitis of left upper extremity 10/15/2016  . Segmental colitis without complication ([place] 176/28/3151 . Diverticulosis of colon without hemorrhage 02/04/2015  . Unspecified vitamin D deficiency 12/25/2013  . UTI (urinary tract infection) 09/29/2011  . 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 Atherosclerosis 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 04/20/2011  . SPINAL STENOSIS, LUMBAR 03/09/2010  . OTHER OSTEOPOROSIS 03/09/2010  . SCOLIOSIS, LUMBAR SPINE 03/09/2010  . MIGRAINE, OPHTHALMIC 10/29/2009  . LUMBAR RADICULOPATHY, ATYPICAL 10/29/2009  . POLYNEUROPATHY OTHER DISEASES CLASSIFIED ELSW 09/22/2009  . UNSPECIFIED PERIPHERAL VASCULAR DISEASE 09/22/2009  . ADENOMATOUS COLONIC POLYP 06/10/2009  . History of iron deficiency 05/06/2009  . CAROTID ARTERY DISEASE 07/28/2008  . Allergic rhinitis 03/20/2008  .  DYSPHAGIA 03/12/2008  . GANGLION OF TENDON SHEATH 08/17/2007  . Hypothyroidism 11/15/2006  . Hyperlipidemia 11/07/2006  . ADVEF, DRUG/MEDICINAL/BIOLOGICAL SUBST NOS 11/07/2006  . Essential hypertension 10/18/2006  . GERD 10/18/2006  . DIVERTICULOSIS, COLON 10/18/2006   AAmador Cunas PT, DPT ADonald ProseSugg 03/19/2020, 4:15 PM  [place] [address]Phone: [phone]  Fax:  [phone] Name: Rebecca OPFERMRN: [id number] of Birth: 1October 04, 1934

## 2020-03-20 ENCOUNTER — Encounter: Payer: Self-pay | Admitting: Family Medicine

## 2020-03-20 ENCOUNTER — Other Ambulatory Visit: Payer: Self-pay

## 2020-03-20 ENCOUNTER — Ambulatory Visit (INDEPENDENT_AMBULATORY_CARE_PROVIDER_SITE_OTHER): Payer: Medicare Other | Admitting: Family Medicine

## 2020-03-20 VITALS — BP 120/60 | HR 45 | Temp 97.9°F | Ht 62.0 in | Wt 123.9 lb

## 2020-03-20 DIAGNOSIS — I10 Essential (primary) hypertension: Secondary | ICD-10-CM | POA: Diagnosis not present

## 2020-03-20 DIAGNOSIS — E785 Hyperlipidemia, unspecified: Secondary | ICD-10-CM

## 2020-03-20 DIAGNOSIS — Z79899 Other long term (current) drug therapy: Secondary | ICD-10-CM | POA: Diagnosis not present

## 2020-03-20 DIAGNOSIS — E038 Other specified hypothyroidism: Secondary | ICD-10-CM

## 2020-03-20 DIAGNOSIS — R001 Bradycardia, unspecified: Secondary | ICD-10-CM

## 2020-03-20 DIAGNOSIS — M810 Age-related osteoporosis without current pathological fracture: Secondary | ICD-10-CM | POA: Diagnosis not present

## 2020-03-20 MED ORDER — PREDNISONE 10 MG PO TABS: ORAL_TABLET | ORAL | 0 refills | Status: DC

## 2020-03-20 NOTE — Patient Instructions (Signed)
Bradycardia, Adult Bradycardia is a slower-than-normal heartbeat. A normal resting heart rate for an adult ranges from 60 to 100 beats per minute. With bradycardia, the resting heart rate is less than 60 beats per minute. Bradycardia can prevent enough oxygen from reaching certain areas of your body when you are active. It can be serious if it keeps enough oxygen from reaching your brain and other parts of your body. Bradycardia is not a problem for everyone. For some healthy adults, a slow resting heart rate is normal. What are the causes? This condition may be caused by:  A problem with the heart, including: ? A problem with the heart's electrical system, such as a heart block. With a heart block, electrical signals between the chambers of the heart are partially or completely blocked, so they are not able to work as they should. ? A problem with the heart's natural pacemaker (sinus node). ? Heart disease. ? A heart attack. ? Heart damage. ? Lyme disease. ? A heart infection. ? A heart condition that is present at birth (congenital heart defect).  Certain medicines that treat heart conditions.  Certain conditions, such as hypothyroidism and obstructive sleep apnea.  Problems with the balance of chemicals and other substances, like potassium, in the blood.  Trauma.  Radiation therapy. What increases the risk? You are more likely to develop this condition if you:  Are age 65 or older.  Have high blood pressure (hypertension), high cholesterol (hyperlipidemia), or diabetes.  Drink heavily, use tobacco or nicotine products, or use drugs. What are the signs or symptoms? Symptoms of this condition include:  Light-headedness.  Feeling faint or fainting.  Fatigue and weakness.  Trouble with activity or exercise.  Shortness of breath.  Chest pain (angina).  Drowsiness.  Confusion.  Dizziness. How is this diagnosed? This condition may be diagnosed based on:  Your  symptoms.  Your medical history.  A physical exam. During the exam, your health care provider will listen to your heartbeat and check your pulse. To confirm the diagnosis, your health care provider may order tests, such as:  Blood tests.  An electrocardiogram (ECG). This test records the heart's electrical activity. The test can show how fast your heart is beating and whether the heartbeat is steady.  A test in which you wear a portable device (event recorder or Holter monitor) to record your heart's electrical activity while you go about your day.  Anexercise test. How is this treated? Treatment for this condition depends on the cause of the condition and how severe your symptoms are. Treatment may involve:  Treatment of the underlying condition.  Changing your medicines or how much medicine you take.  Having a small, battery-operated device called a pacemaker implanted under the skin. When bradycardia occurs, this device can be used to increase your heart rate and help your heart beat in a regular rhythm. Follow these instructions at home: Lifestyle   Manage any health conditions that contribute to bradycardia as told by your health care provider.  Follow a heart-healthy diet. A nutrition specialist (dietitian) can help educate you about healthy food options and changes.  Follow an exercise program that is approved by your health care provider.  Maintain a healthy weight.  Try to reduce or manage your stress, such as with yoga or meditation. If you need help reducing stress, ask your health care provider.  Do not use any products that contain nicotine or tobacco, such as cigarettes, e-cigarettes, and chewing tobacco. If you need help   quitting, ask your health care provider.  Do not use illegal drugs.  Limit alcohol intake to no more than 1 drink a day for nonpregnant women and 2 drinks a day for men. Be aware of how much alcohol is in your drink. In the [place] one drink  equals one 12 oz bottle of beer (355 mL), one 5 oz glass of wine (148 mL), or one 1 oz glass of hard liquor (44 mL). General instructions  Take over-the-counter and prescription medicines only as told by your health care provider.  Keep all follow-up visits as told by your health care provider. This is important. How is this prevented? In some cases, bradycardia may be prevented by:  Treating underlying medical problems.  Stopping behaviors or medicines that can trigger the condition. Contact a health care provider if you:  Feel light-headed or dizzy.  Almost faint.  Feel weak or are easily fatigued during physical activity.  Experience confusion or have memory problems. Get help right away if:  You faint.  You have: ? An irregular heartbeat (palpitations). ? Chest pain. ? Trouble breathing. Summary  Bradycardia is a slower-than-normal heartbeat. With bradycardia, the resting heart rate is less than 60 beats per minute.  Treatment for this condition depends on the cause.  Manage any health conditions that contribute to bradycardia as told by your health care provider.  Do not use any products that contain nicotine or tobacco, such as cigarettes, e-cigarettes, and chewing tobacco, and limit alcohol intake.  Keep all follow-up visits as told by your health care provider. This is important. This information is not intended to replace advice given to you by your health care provider. Make sure you discuss any questions you have with your health care provider. Document Revised: 10/02/2017 Document Reviewed: 08/30/2017 Elsevier Patient Education  2020 [employer]  

## 2020-03-20 NOTE — Progress Notes (Signed)
Established Patient Office Visit  Subjective:  Patient ID: Rebecca [name], female    DOB: [birth year]  Age: 86 y.o. MRN: [id number]  CC:  Chief Complaint  Patient presents with  . Annual Exam    HPI Rebecca [name] presents for medical follow-up.  She has past history significant for IBS, hypertension, hyperlipidemia, GERD, hypothyroidism, osteoporosis.  This past July she sustained hip fracture.  She had taken Reclast over 5 years in the past.  She is scheduled for repeat mammogram and DEXA scan this January.  She needs follow-up labs today.  She specifically requests B12 level checked.  She has been on Nexium 20 mg twice daily for years.  She has tried tapering off without success.  Past Medical History:  Diagnosis Date  . Anemia   . Anxiety   . Diverticulitis   . Diverticulosis   . Gastroparesis   . GERD (gastroesophageal reflux disease)   . Glaucoma   . Hemorrhoids   . Hyperlipidemia   . Hypertension   . Hypertension   . IBS (irritable bowel syndrome)   . Rectal ulcer   . Segmental colitis ([place])   . Tubular adenoma of colon 05/2009    Past Surgical History:  Procedure Laterality Date  . APPENDECTOMY    . ARCUATE KERATECTOMY    . CATARACT EXTRACTION, BILATERAL  2015  . double pallital tori bone removed     . HERNIA REPAIR     umbilical  . KNEE SURGERY Bilateral   . morton's neuroma on rt foot and platar facial release    . MOUTH SURGERY     "bone shaved off roof of mouth"  . SALIVARY GLAND SURGERY     removal  . TONSILLECTOMY      Family History  Problem Relation Age of Onset  . Diabetes Mother   . Heart disease Mother   . Hyperlipidemia Mother   . Hypertension Mother   . Anuerysm Father   . Heart disease Father   . Hypertension Father   . Migraines Daughter   . Kidney Stones Daughter   . Colon polyps Sister   . Lung cancer Sister   . Colon polyps Brother   . Diabetes Brother   . Hyperlipidemia Brother   . Hypertension Brother   . Colon  polyps Daughter   . Breast cancer Daughter   . Colon cancer Maternal Aunt        Rectal cancer  . Cancer Daughter        salivary  . Colon cancer Paternal Grandmother        with possible stomach cancer  . Heart disease Brother   . Crohn's disease Neg Hx   . Pancreatic cancer Neg Hx     Social History   Socioeconomic History  . Marital status: Widowed    Spouse name: Not on file  . Number of children: 3  . Years of education: Not on file  . Highest education level: Not on file  Occupational History  . Occupation: Glass blower/designer    Comment: retired  Tobacco Use  . Smoking status: Never Smoker  . Smokeless tobacco: Never Used  Vaping Use  . Vaping Use: Never used  Substance and Sexual Activity  . Alcohol use: Yes    Alcohol/week: 0.0 standard drinks    Comment: rare  . Drug use: No  . Sexual activity: Yes  Other Topics Concern  . 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 Difficulty of Paying Living Expenses: Not hard at all  Food Insecurity: No Food Insecurity  . Worried About Charity fundraiser in the Last Year: Never true  . Ran Out of Food in the Last Year: Never true  Transportation Needs: No Transportation Needs  . Lack of Transportation (Medical): No  . Lack of Transportation (Non-Medical): No  Physical Activity: Insufficiently Active  . Days of Exercise per Week: 7 days  . Minutes of Exercise per Session: 20 min  Stress: No Stress Concern Present  . Feeling of Stress : Not at all  Social Connections: Moderately Isolated  . Frequency of Communication with Friends and Family: More than three times a week  .  Frequency of Social Gatherings with Friends and Family: More than three times a week  . Attends Religious Services: More than 4 times per year  . Active Member of Clubs or Organizations: No  . Attends Archivist Meetings: Never  . Marital Status: Widowed  Intimate Partner Violence: Not At Risk  . Fear of Current or Ex-Partner: No  . Emotionally Abused: No  . Physically Abused: No  . Sexually Abused: No    Outpatient Medications Prior to Visit  Medication Sig Dispense Refill  . ALPRAZolam (XANAX) 0.25 MG tablet TAKE 1 TABLET(0.25 MG) BY MOUTH AT BEDTIME AS NEEDED 20 tablet 0  . Calcium Carb-Cholecalciferol (CALCIUM-VITAMIN D) 600-400 MG-UNIT TABS Take 1 tablet by mouth daily.    . Cholecalciferol (VITAMIN D3) 2000 UNITS TABS Take 1 tablet by mouth daily.    . diclofenac sodium (VOLTAREN) 1 % GEL Apply 2 g topically 4 (four) times daily. 1 Tube 1  . esomeprazole (NEXIUM) 20 MG capsule Take 20 mg by mouth 2 (two) times daily before a meal.    . gabapentin (NEURONTIN) 100 MG capsule TAKE 2 CAPSULES(200 MG) BY MOUTH AT BEDTIME (Patient taking differently: Take 100 mg by mouth daily.) 180 capsule 3  . levothyroxine (SYNTHROID) 88 MCG tablet TAKE 1 TABLET(88 MCG) BY MOUTH DAILY 90 tablet 1  . losartan (COZAAR) 100 MG tablet TAKE 1 TABLET BY MOUTH EVERY DAY 90 tablet 0  . metoCLOPramide (REGLAN) 5 MG tablet TAKE 1 TABLET BY MOUTH TWICE DAILY WITH MEALS 60 tablet 0  . simvastatin (ZOCOR) 20 MG tablet TAKE 1 TABLET(20 MG) BY MOUTH AT BEDTIME 90 tablet 0  . timolol (TIMOPTIC) 0.5 % ophthalmic solution INT 1 GTT IN OU BID    . vitamin B-12 (CYANOCOBALAMIN) 500 MCG tablet Take 1,000 mcg by mouth every morning.    . vitamin C (ASCORBIC ACID) 500 MG tablet Take 500 mg by mouth daily.     No facility-administered medications prior to visit.    Allergies  Allergen Reactions  . Levaquin [Levofloxacin] Other (See Comments)    Very nauseous and shakey  . Sulfamethoxazole-Trimethoprim Nausea  Only    shakey  . Lactose Intolerance (Gi) Diarrhea and Other (See Comments)    Stomach hurts   . Tramadol Hcl Nausea Only and Palpitations  . Tramadol Hcl Nausea Only and Palpitations  . Augmentin [  Amoxicillin-Pot Clavulanate] Diarrhea  . Ciprofloxacin Nausea Only    REACTION: Thrush, Nausea, Shakey  . Pneumococcal Vaccine Polyvalent     REACTION: RED AND RAISED RASH ON ARM  . Adhesive [Tape] Rash  . Codeine Nausea Only    ROS Review of Systems  Constitutional: Negative for fatigue.  Eyes: Negative for visual disturbance.  Respiratory: Negative for cough, chest tightness, shortness of breath and wheezing.   Cardiovascular: Negative for chest pain, palpitations and leg swelling.  Neurological: Negative for dizziness, seizures, syncope, weakness, light-headedness and headaches.      Objective:    Physical Exam Constitutional:      Appearance: She is well-developed and well-nourished.  Eyes:     Pupils: Pupils are equal, round, and reactive to light.  Neck:     Thyroid: No thyromegaly.     Vascular: No JVD.  Cardiovascular:     Rate and Rhythm: Regular rhythm.     Heart sounds: No gallop.      Comments: Heart rate palpated was 46-48 and regular Pulmonary:     Effort: Pulmonary effort is normal. No respiratory distress.     Breath sounds: Normal breath sounds. No wheezing or rales.  Musculoskeletal:        General: No edema.     Cervical back: Neck supple.  Neurological:     Mental Status: She is alert.     BP 120/60 (BP Location: Left Arm, Patient Position: Sitting, Cuff Size: Normal)   Pulse (!) 45   Temp 97.9 F (36.6 C) (Oral)   Ht 5\' 2"  (1.575 m)   Wt 123 lb 14.4 oz (56.2 kg)   BMI 22.66 kg/m  Wt Readings from Last 3 Encounters:  03/20/20 123 lb 14.4 oz (56.2 kg)  03/17/20 122 lb (55.3 kg)  02/18/20 122 lb (55.3 kg)     Health Maintenance Due  Topic Date Due  . TETANUS/TDAP  07/29/2018  . COVID-19 Vaccine (3 - Booster for Pfizer series) 11/24/2019     There are no preventive care reminders to display for this patient.  Lab Results  Component Value Date   TSH 2.24 08/16/2019   Lab Results  Component Value Date   WBC 11.5 (H) 05/01/2018   HGB 12.6 05/01/2018   HCT 37.7 05/01/2018   MCV 100.6 (H) 05/01/2018   PLT 226.0 05/01/2018   Lab Results  Component Value Date   NA 132 (L) 02/15/2019   K 4.6 02/15/2019   CO2 30 02/15/2019   GLUCOSE 99 02/15/2019   BUN 11 02/15/2019   CREATININE 0.90 02/15/2019   BILITOT 0.8 02/15/2019   ALKPHOS 59 02/15/2019   AST 19 02/15/2019   ALT 22 02/15/2019   PROT 6.0 02/15/2019   ALBUMIN 4.1 02/15/2019   CALCIUM 9.7 02/15/2019   ANIONGAP 7 02/23/2016   GFR 59.65 (L) 02/15/2019   Lab Results  Component Value Date   CHOL 189 02/15/2019   Lab Results  Component Value Date   HDL 55.90 02/15/2019   Lab Results  Component Value Date   LDLCALC 106 (H) 02/15/2019   Lab Results  Component Value Date   TRIG 137.0 02/15/2019   Lab Results  Component Value Date   CHOLHDL 3 02/15/2019   Lab Results  Component Value Date   HGBA1C 5.6 07/26/2006      Assessment & Plan:   Problem List Items Addressed This Visit      Unprioritized   Essential hypertension - Primary   Relevant Orders  Basic metabolic panel   Hyperlipidemia   Relevant Orders   Lipid panel   Hepatic function panel   Hypothyroidism   Relevant Orders   TSH    Other Visit Diagnoses    High risk medication use       Relevant Orders   Vitamin B12   Osteoporosis without current pathological fracture, unspecified osteoporosis type       Relevant Orders   VITAMIN D 25 Hydroxy (Vit-D Deficiency, Fractures)   Bradycardia       Relevant Orders   EKG 12-Lead    Patient has multiple chronic problems as above.  We noted on exam she had heart rate in the 40s but was asymptomatic.  EKG shows sinus bradycardia.  No evidence for second or third-degree heart block.  Recommend reassurance and observation for  now  -Obtain follow-up labs with TSH, lipid, basic metabolic panel, hepatic panel, 25-hydroxy vitamin D, B12 level  -Patient requesting refill of prednisone for right-sided sciatica symptoms.  She is taking this briefly in the past for sciatica flareups.  We explained that we would limit prednisone as much as possible with her osteoporosis but agreed to a short taper over 1 week.  She remains on gabapentin.  -Recommend 70-month office follow-up to reassess pulse and other medical problems  Meds ordered this encounter  Medications  . predniSONE (DELTASONE) 10 MG tablet    Sig: Taper as follows: 4-4-4-3-3-2-2-1-1    Dispense:  24 tablet    Refill:  0    Follow-up: Return in about 3 months (around 06/18/2020).    Carolann Littler, MD

## 2020-03-21 LAB — HEPATIC FUNCTION PANEL
AG Ratio: 2.2 (calc) (ref 1.0–2.5)
ALT: 10 U/L (ref 6–29)
AST: 18 U/L (ref 10–35)
Albumin: 4.3 g/dL (ref 3.6–5.1)
Alkaline phosphatase (APISO): 69 U/L (ref 37–153)
Bilirubin, Direct: 0.1 mg/dL (ref 0.0–0.2)
Globulin: 2 g/dL (calc) (ref 1.9–3.7)
Indirect Bilirubin: 0.5 mg/dL (calc) (ref 0.2–1.2)
Total Bilirubin: 0.6 mg/dL (ref 0.2–1.2)
Total Protein: 6.3 g/dL (ref 6.1–8.1)

## 2020-03-21 LAB — BASIC METABOLIC PANEL WITH GFR
BUN/Creatinine Ratio: 13 (calc) (ref 6–22)
BUN: 12 mg/dL (ref 7–25)
CO2: 27 mmol/L (ref 20–32)
Calcium: 10.1 mg/dL (ref 8.6–10.4)
Chloride: 98 mmol/L (ref 98–110)
Creat: 0.9 mg/dL — ABNORMAL HIGH (ref 0.60–0.88)
Glucose, Bld: 101 mg/dL — ABNORMAL HIGH (ref 65–99)
Potassium: 4.6 mmol/L (ref 3.5–5.3)
Sodium: 133 mmol/L — ABNORMAL LOW (ref 135–146)

## 2020-03-21 LAB — LIPID PANEL
Cholesterol: 214 mg/dL — ABNORMAL HIGH
HDL: 51 mg/dL
LDL Cholesterol (Calc): 127 mg/dL — ABNORMAL HIGH
Non-HDL Cholesterol (Calc): 163 mg/dL — ABNORMAL HIGH
Total CHOL/HDL Ratio: 4.2 (calc)
Triglycerides: 225 mg/dL — ABNORMAL HIGH

## 2020-03-21 LAB — VITAMIN D 25 HYDROXY (VIT D DEFICIENCY, FRACTURES): Vit D, 25-Hydroxy: 45 ng/mL (ref 30–100)

## 2020-03-21 LAB — VITAMIN B12: Vitamin B-12: 366 pg/mL (ref 200–1100)

## 2020-03-21 LAB — TSH: TSH: 0.73 mIU/L (ref 0.40–4.50)

## 2020-03-22 NOTE — Patient Instructions (Addendum)
Hi Rebecca [name],  It was lovely to get to meet you over the phone! As we had discussed, please start checking your blood pressure at home to make sure your medications are working properly. Below is a summary of some of the other topics we discussed. I may have my assistant call you just to check in in a couple of months. Also, don't forget to look into getting your tetanus shot to stay up to date on all of your immunizations.  Please give me a call if you have any questions or need anything before our follow up!  Best, Maddie  Jeni Salles, PharmD [facility] Clinical Pharmacist [place] [facility] [place]   Visit Information  Goals Addressed   None     Ms. Dondlinger was given information about Chronic Care Management services today including:  1. CCM service includes personalized support from designated clinical staff supervised by her physician, including individualized plan of care and coordination with other care providers 2. 24/7 contact phone numbers for assistance for urgent and routine care needs. 3. Standard insurance, coinsurance, copays and deductibles apply for chronic care management only during months in which we provide at least 20 minutes of these services. Most insurances cover these services at 100%, however patients may be responsible for any copay, coinsurance and/or deductible if applicable. This service may help you avoid the need for more expensive face-to-face services. 4. Only one practitioner may furnish and bill the service in a calendar month. 5. The patient may stop CCM services at any time (effective at the end of the month) by phone call to the office staff.  Patient agreed to services and verbal consent obtained.   The patient verbalized understanding of instructions, educational materials, and care plan provided today and agreed to receive a mailed copy of patient instructions, educational materials, and care plan.  Telephone follow up  appointment with pharmacy team member scheduled for: 6 months  [name], [facility]  High Cholesterol  High cholesterol is a condition in which the blood has high levels of a white, waxy, fat-like substance (cholesterol). The human body needs small amounts of cholesterol. The liver makes all the cholesterol that the body needs. Extra (excess) cholesterol comes from the food that we eat. Cholesterol is carried from the liver by the blood through the blood vessels. If you have high cholesterol, deposits (plaques) may build up on the walls of your blood vessels (arteries). Plaques make the arteries narrower and stiffer. Cholesterol plaques increase your risk for heart attack and stroke. Work with your health care provider to keep your cholesterol levels in a healthy range. What increases the risk? This condition is more likely to develop in people who:  Eat foods that are high in animal fat (saturated fat) or cholesterol.  Are overweight.  Are not getting enough exercise.  Have a family history of high cholesterol. What are the signs or symptoms? There are no symptoms of this condition. How is this diagnosed? This condition may be diagnosed from the results of a blood test.  If you are older than age 75, your health care provider may check your cholesterol every 4-6 years.  You may be checked more often if you already have high cholesterol or other risk factors for heart disease. The blood test for cholesterol measures:  "Bad" cholesterol (LDL cholesterol). This is the main type of cholesterol that causes heart disease. The desired level for LDL is less than 100.  "Good" cholesterol (HDL cholesterol). This type helps  to protect against heart disease by cleaning the arteries and carrying the LDL away. The desired level for HDL is 60 or higher.  Triglycerides. These are fats that the body can store or burn for energy. The desired number for triglycerides is lower than 150.  Total  cholesterol. This is a measure of the total amount of cholesterol in your blood, including LDL cholesterol, HDL cholesterol, and triglycerides. A healthy number is less than 200. How is this treated? This condition is treated with diet changes, lifestyle changes, and medicines. Diet changes  This may include eating more whole grains, fruits, vegetables, nuts, and fish.  This may also include cutting back on red meat and foods that have a lot of added sugar. Lifestyle changes  Changes may include getting at least 40 minutes of aerobic exercise 3 times a week. Aerobic exercises include walking, biking, and swimming. Aerobic exercise along with a healthy diet can help you maintain a healthy weight.  Changes may also include quitting smoking. Medicines  Medicines are usually given if diet and lifestyle changes have failed to reduce your cholesterol to healthy levels.  Your health care provider may prescribe a statin medicine. Statin medicines have been shown to reduce cholesterol, which can reduce the risk of heart disease. Follow these instructions at home: Eating and drinking If told by your health care provider:  Eat chicken (without skin), fish, veal, shellfish, ground [place] breast, and round or loin cuts of red meat.  Do not eat fried foods or fatty meats, such as hot dogs and salami.  Eat plenty of fruits, such as apples.  Eat plenty of vegetables, such as broccoli, potatoes, and carrots.  Eat beans, peas, and lentils.  Eat grains such as barley, rice, couscous, and bulgur wheat.  Eat pasta without cream sauces.  Use skim or nonfat milk, and eat low-fat or nonfat yogurt and cheeses.  Do not eat or drink whole milk, cream, ice cream, egg yolks, or hard cheeses.  Do not eat stick margarine or tub margarines that contain trans fats (also called partially hydrogenated oils).  Do not eat saturated tropical oils, such as coconut oil and palm oil.  Do not eat cakes, cookies,  crackers, or other baked goods that contain trans fats.  General instructions  Exercise as directed by your health care provider. Increase your activity level with activities such as gardening, walking, and taking the stairs.  Take over-the-counter and prescription medicines only as told by your health care provider.  Do not use any products that contain nicotine or tobacco, such as cigarettes and e-cigarettes. If you need help quitting, ask your health care provider.  Keep all follow-up visits as told by your health care provider. This is important. Contact a health care provider if:  You are struggling to maintain a healthy diet or weight.  You need help to start on an exercise program.  You need help to stop smoking. Get help right away if:  You have chest pain.  You have trouble breathing. This information is not intended to replace advice given to you by your health care provider. Make sure you discuss any questions you have with your health care provider. Document Revised: 03/24/2017 Document Reviewed: 09/19/2015 Elsevier Patient Education  [place].

## 2020-03-23 ENCOUNTER — Other Ambulatory Visit: Payer: Self-pay

## 2020-03-23 DIAGNOSIS — E038 Other specified hypothyroidism: Secondary | ICD-10-CM

## 2020-03-23 DIAGNOSIS — I10 Essential (primary) hypertension: Secondary | ICD-10-CM

## 2020-03-23 NOTE — Progress Notes (Signed)
B12 normal.  Vit D normal.  Lipids are high but other labs all stable.

## 2020-03-25 DIAGNOSIS — H401132 Primary open-angle glaucoma, bilateral, moderate stage: Secondary | ICD-10-CM | POA: Diagnosis not present

## 2020-03-26 ENCOUNTER — Telehealth: Payer: Self-pay | Admitting: Pharmacist

## 2020-03-26 ENCOUNTER — Encounter: Payer: Self-pay | Admitting: Family Medicine

## 2020-03-26 DIAGNOSIS — I10 Essential (primary) hypertension: Secondary | ICD-10-CM

## 2020-03-26 DIAGNOSIS — E785 Hyperlipidemia, unspecified: Secondary | ICD-10-CM

## 2020-03-26 NOTE — Telephone Encounter (Signed)
Called patient about results of most recent cholesterol panel. Per Dr. Elease Hashimoto, plan is to increase to simvastatin 40 mg, patient will take 2 of the 20 mg tablets daily, and then repeat cholesterol panel in 6 weeks.  Scheduled lab appointment in 6 weeks.  Patient is in agreement with plan and plans to also limit sugars given elevated triglycerides.

## 2020-03-29 NOTE — Telephone Encounter (Signed)
Thank you :)

## 2020-04-06 ENCOUNTER — Ambulatory Visit: Payer: Medicare Other | Admitting: Physical Therapy

## 2020-04-08 ENCOUNTER — Encounter: Payer: Self-pay | Admitting: Family Medicine

## 2020-04-08 DIAGNOSIS — Z1231 Encounter for screening mammogram for malignant neoplasm of breast: Secondary | ICD-10-CM | POA: Diagnosis not present

## 2020-04-09 ENCOUNTER — Encounter: Payer: Self-pay | Admitting: Physical Therapy

## 2020-04-09 ENCOUNTER — Other Ambulatory Visit: Payer: Self-pay

## 2020-04-09 ENCOUNTER — Ambulatory Visit: Payer: Medicare Other | Attending: Orthopaedic Surgery | Admitting: Physical Therapy

## 2020-04-09 DIAGNOSIS — M6281 Muscle weakness (generalized): Secondary | ICD-10-CM | POA: Diagnosis not present

## 2020-04-09 DIAGNOSIS — G8929 Other chronic pain: Secondary | ICD-10-CM | POA: Diagnosis not present

## 2020-04-09 DIAGNOSIS — M5441 Lumbago with sciatica, right side: Secondary | ICD-10-CM | POA: Insufficient documentation

## 2020-04-09 DIAGNOSIS — R262 Difficulty in walking, not elsewhere classified: Secondary | ICD-10-CM | POA: Diagnosis not present

## 2020-04-09 DIAGNOSIS — M25552 Pain in left hip: Secondary | ICD-10-CM | POA: Insufficient documentation

## 2020-04-09 NOTE — Therapy (Signed)
[place]. [place], [place], [address] Phone: [phone]   Fax:  [phone]  Physical Therapy Treatment  Patient Details  Name: Rebecca [name] MRN: [id number] Date of Birth: November 07, 1932 Referring Provider (PT): Joni Fears   Encounter Date: 04/09/2020   PT End of Session - 04/09/20 1359    Visit Number 7    Date for PT Re-Evaluation 04/21/20    PT Start Time 1314    PT Stop Time 1357    PT Time Calculation (min) 43 min    Activity Tolerance Patient tolerated treatment well    Behavior During Therapy [facility] for tasks assessed/performed           Past Medical History:  Diagnosis Date  . Anemia   . Anxiety   . Diverticulitis   . Diverticulosis   . Gastroparesis   . GERD (gastroesophageal reflux disease)   . Glaucoma   . Hemorrhoids   . Hyperlipidemia   . Hypertension   . Hypertension   . IBS (irritable bowel syndrome)   . Rectal ulcer   . Segmental colitis ([place])   . Tubular adenoma of colon 05/2009    Past Surgical History:  Procedure Laterality Date  . APPENDECTOMY    . ARCUATE KERATECTOMY    . CATARACT EXTRACTION, BILATERAL  2015  . double pallital tori bone removed     . HERNIA REPAIR     umbilical  . KNEE SURGERY Bilateral   . morton's neuroma on rt foot and platar facial release    . MOUTH SURGERY     "bone shaved off roof of mouth"  . SALIVARY GLAND SURGERY     removal  . TONSILLECTOMY      There were no vitals filed for this visit.   Subjective Assessment - 04/09/20 1318    Subjective Pt reports that R hip and leg have mild radiating pain right now but better overall. States that she picked up something heavy yesterday and she thinks that is what caused it.    Currently in Pain? Yes    Pain Score 3     Pain Location Buttocks    Pain Orientation Right                             [facility] Adult PT Treatment/Exercise - 04/09/20 0001      High Level Balance    High Level Balance Activities Side stepping;Backward walking;Tandem walking;Marching forwards;Marching backwards    High Level Balance Comments sidestepping on foam      Knee/Hip Exercises: Aerobic   Nustep L5 x 6 min    Other Aerobic L1.7 3 min each      Knee/Hip Exercises: Standing   Heel Raises Both;1 set;15 reps    Heel Raises Limitations 2.5#    Knee Flexion Both;1 set;10 reps    Knee Flexion Limitations 2.5#    Hip Abduction Both;1 set;10 reps    Abduction Limitations 2.5#    Hip Extension Both;1 set;10 reps    Extension Limitations 2.5#      Knee/Hip Exercises: Seated   Long Arc Quad Both;10 reps;2 sets    Long Arc Quad Weight 2 lbs.    Marching Both;10 reps;2 sets    Marching Weights 2 lbs.    Hamstring Curl Both;2 sets;10 reps    Hamstring Limitations red tb    Sit to Sand 2 sets;10 reps;without UE support  holding yellow ball out from chest                   PT Short Term Goals - 03/04/20 1451      PT SHORT TERM GOAL #1   Title Pt will be independent with HEP    Status Achieved             PT Long Term Goals - 03/05/20 1145      PT LONG TERM GOAL #1   Title Pt will demo LLE MMT equivalent to RLE    Time 6    Period Weeks    Status On-going      PT LONG TERM GOAL #2   Title Pt will demo gait with LRAD supervision assist and minimal gait deviations    Time 6    Period Weeks    Status On-going      PT LONG TERM GOAL #3   Title Pt will report 50% reduction in L hip pain while walking    Time 6    Period Weeks    Status Partially Met                 Plan - 04/09/20 1359    Clinical Impression Statement Pt with improved pain today; pt took course of prednisone which she states greatly helped with pain relief. Pt able to tolerate more TE today in standing with no c/o of increased RLE or R buttocks pain with exercise. Pt demos B hip abduction weakness with exercise R>L, cues to avoid compensations. Pt did well with high level  balance exercises; cues for increased step length with backwards walking. Occasional LOB, able to self correct.    PT Treatment/Interventions ADLs/Self Care Home Management;Electrical Stimulation;Moist Heat;Iontophoresis 30m/ml Dexamethasone;Gait training;Stair training;Functional mobility training;Therapeutic activities;Therapeutic exercise;Balance training;Neuromuscular re-education;Manual techniques;Patient/family education;Passive range of motion    PT Next Visit Plan gait training with SPC, LE strength/flexibility, incorporate balance ex's    Consulted and Agree with Plan of Care Patient           Patient will benefit from skilled therapeutic intervention in order to improve the following deficits and impairments:  Abnormal gait,Difficulty walking,Decreased endurance,Decreased activity tolerance,Pain,Decreased balance,Impaired flexibility,Decreased mobility,Decreased strength,Postural dysfunction  Visit Diagnosis: Pain in left hip  Muscle weakness (generalized)  Difficulty in walking, not elsewhere classified  Chronic bilateral low back pain with right-sided sciatica     Problem List Patient Active Problem List   Diagnosis Date Noted  . Pain in left hip 12/17/2019  . Pain in left leg 11/06/2019  . Bilateral primary osteoarthritis of knee 06/11/2019  . Pain in left shoulder 05/22/2019  . Cellulitis of left upper extremity 10/15/2016  . 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 Diverticulosis of colon without hemorrhage 02/04/2015  . Unspecified vitamin D deficiency 12/25/2013  . UTI (urinary tract infection) 09/29/2011  . 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 Atherosclerosis 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 04/20/2011  . SPINAL STENOSIS, LUMBAR 03/09/2010  . OTHER OSTEOPOROSIS 03/09/2010  . SCOLIOSIS, LUMBAR SPINE 03/09/2010  . MIGRAINE, OPHTHALMIC 10/29/2009  . LUMBAR RADICULOPATHY, ATYPICAL 10/29/2009  .  POLYNEUROPATHY OTHER DISEASES CLASSIFIED ELSW 09/22/2009  . UNSPECIFIED PERIPHERAL VASCULAR DISEASE 09/22/2009  . ADENOMATOUS COLONIC POLYP 06/10/2009  . History of iron deficiency 05/06/2009  . CAROTID ARTERY DISEASE 07/28/2008  . Allergic rhinitis 03/20/2008  . DYSPHAGIA 03/12/2008  . GANGLION OF TENDON SHEATH 08/17/2007  . Hypothyroidism 11/15/2006  . Hyperlipidemia 11/07/2006  . ADVEF, DRUG/MEDICINAL/BIOLOGICAL  SUBST NOS 11/07/2006  . Essential hypertension 10/18/2006  . GERD 10/18/2006  . DIVERTICULOSIS, COLON 10/18/2006   Amador Cunas, PT, DPT Donald Prose Beckam Abdulaziz 04/09/2020, 2:02 PM  [place]. [place], [place], [address] Phone: [phone]   Fax:  [phone]  Name: Rebecca [name] MRN: [id number] Date of Birth: 07/09/1932

## 2020-04-13 ENCOUNTER — Other Ambulatory Visit: Payer: Self-pay | Admitting: Family Medicine

## 2020-04-13 ENCOUNTER — Ambulatory Visit: Payer: Medicare Other | Admitting: Physical Therapy

## 2020-04-16 ENCOUNTER — Other Ambulatory Visit: Payer: Self-pay

## 2020-04-16 ENCOUNTER — Encounter: Payer: Self-pay | Admitting: Physical Therapy

## 2020-04-16 ENCOUNTER — Ambulatory Visit: Payer: Medicare Other | Admitting: Physical Therapy

## 2020-04-16 DIAGNOSIS — M25552 Pain in left hip: Secondary | ICD-10-CM | POA: Diagnosis not present

## 2020-04-16 DIAGNOSIS — R262 Difficulty in walking, not elsewhere classified: Secondary | ICD-10-CM

## 2020-04-16 DIAGNOSIS — G8929 Other chronic pain: Secondary | ICD-10-CM

## 2020-04-16 DIAGNOSIS — M5441 Lumbago with sciatica, right side: Secondary | ICD-10-CM | POA: Diagnosis not present

## 2020-04-16 DIAGNOSIS — M6281 Muscle weakness (generalized): Secondary | ICD-10-CM

## 2020-04-16 NOTE — Therapy (Signed)
[place]. [place], [place], [address] Phone: [phone]   Fax:  [phone]  Physical Therapy Treatment  Patient Details  Name: Rebecca [name] MRN: [id number] Date of Birth: February 27, 1933 Referring Provider (PT): Joni Fears   Encounter Date: 04/16/2020   PT End of Session - 04/16/20 1405    Visit Number 8    Date for PT Re-Evaluation 04/21/20    PT Start Time 1314    PT Stop Time 1358    PT Time Calculation (min) 44 min    Activity Tolerance Patient tolerated treatment well    Behavior During Therapy [facility] for tasks assessed/performed           Past Medical History:  Diagnosis Date  . Anemia   . Anxiety   . Diverticulitis   . Diverticulosis   . Gastroparesis   . GERD (gastroesophageal reflux disease)   . Glaucoma   . Hemorrhoids   . Hyperlipidemia   . Hypertension   . Hypertension   . IBS (irritable bowel syndrome)   . Rectal ulcer   . Segmental colitis ([place])   . Tubular adenoma of colon 05/2009    Past Surgical History:  Procedure Laterality Date  . APPENDECTOMY    . ARCUATE KERATECTOMY    . CATARACT EXTRACTION, BILATERAL  2015  . double pallital tori bone removed     . HERNIA REPAIR     umbilical  . KNEE SURGERY Bilateral   . morton's neuroma on rt foot and platar facial release    . MOUTH SURGERY     "bone shaved off roof of mouth"  . SALIVARY GLAND SURGERY     removal  . TONSILLECTOMY      There were no vitals filed for this visit.   Subjective Assessment - 04/16/20 1323    Subjective Pt reports that LB is a little sore today. States R hip and leg are better, a little radiating pain left.    Currently in Pain? Yes    Pain Score 4     Pain Location Back    Pain Orientation Right                             [facility] Adult PT Treatment/Exercise - 04/16/20 0001      High Level Balance   High Level Balance Activities Side stepping;Backward walking;Tandem  walking    High Level Balance Comments sidestepping on foam, tandem stance on foam, rhomberg EO and EC on airex, marching on airex, lateral stepping on/off airex, bkwd walking firm surface. all in II bars      Knee/Hip Exercises: Aerobic   Nustep L5 x 6 min    Other Aerobic L1.7 3 min each      Knee/Hip Exercises: Machines for Strengthening   Other Machine rows and lats 15# 2x10                    PT Short Term Goals - 03/04/20 1451      PT SHORT TERM GOAL #1   Title Pt will be independent with HEP    Status Achieved             PT Long Term Goals - 03/05/20 1145      PT LONG TERM GOAL #1   Title Pt will demo LLE MMT equivalent to RLE    Time 6    Period Weeks  Status On-going      PT LONG TERM GOAL #2   Title Pt will demo gait with LRAD supervision assist and minimal gait deviations    Time 6    Period Weeks    Status On-going      PT LONG TERM GOAL #3   Title Pt will report 50% reduction in L hip pain while walking    Time 6    Period Weeks    Status Partially Met                 Plan - 04/16/20 1406    Clinical Impression Statement Pt ambulates into clinic with [facility] today; states that some days she uses it to feel steadier. Extensive high level balance ex's this rx. SBA-CGA for all balance activities in II bars. Pt demos difficulty with tandem stance and L lateral stepping onto airex with several instances of LOB, able to self correct. Pt did well with backwards walking, some cuing for increased step length. Discussed potential d/c at last scheduled rx. Pt does not want to go to [facility]; possibly interested in independent gym program or in d/c with updated home exercises. Follow up next rx.    PT Treatment/Interventions ADLs/Self Care Home Management;Electrical Stimulation;Moist Heat;Iontophoresis 62m/ml Dexamethasone;Gait training;Stair training;Functional mobility training;Therapeutic activities;Therapeutic exercise;Balance training;Neuromuscular  re-education;Manual techniques;Patient/family education;Passive range of motion    PT Next Visit Plan gait training with SPC, LE strength/flexibility, incorporate balance ex's    Consulted and Agree with Plan of Care Patient           Patient will benefit from skilled therapeutic intervention in order to improve the following deficits and impairments:  Abnormal gait,Difficulty walking,Decreased endurance,Decreased activity tolerance,Pain,Decreased balance,Impaired flexibility,Decreased mobility,Decreased strength,Postural dysfunction  Visit Diagnosis: Pain in left hip  Muscle weakness (generalized)  Difficulty in walking, not elsewhere classified  Chronic bilateral low back pain with right-sided sciatica     Problem List Patient Active Problem List   Diagnosis Date Noted  . Pain in left hip 12/17/2019  . Pain in left leg 11/06/2019  . Bilateral primary osteoarthritis of knee 06/11/2019  . Pain in left shoulder 05/22/2019  . Cellulitis of left upper extremity 10/15/2016  . 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 Diverticulosis of colon without hemorrhage 02/04/2015  . Unspecified vitamin D deficiency 12/25/2013  . UTI (urinary tract infection) 09/29/2011  . 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 Atherosclerosis 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 04/20/2011  . SPINAL STENOSIS, LUMBAR 03/09/2010  . OTHER OSTEOPOROSIS 03/09/2010  . SCOLIOSIS, LUMBAR SPINE 03/09/2010  . MIGRAINE, OPHTHALMIC 10/29/2009  . LUMBAR RADICULOPATHY, ATYPICAL 10/29/2009  . POLYNEUROPATHY OTHER DISEASES CLASSIFIED ELSW 09/22/2009  . UNSPECIFIED PERIPHERAL VASCULAR DISEASE 09/22/2009  . ADENOMATOUS COLONIC POLYP 06/10/2009  . History of iron deficiency 05/06/2009  . CAROTID ARTERY DISEASE 07/28/2008  . Allergic rhinitis 03/20/2008  . DYSPHAGIA 03/12/2008  . GANGLION OF TENDON SHEATH 08/17/2007  . Hypothyroidism 11/15/2006  . Hyperlipidemia  11/07/2006  . ADVEF, DRUG/MEDICINAL/BIOLOGICAL SUBST NOS 11/07/2006  . Essential hypertension 10/18/2006  . GERD 10/18/2006  . DIVERTICULOSIS, COLON 10/18/2006   AAmador Cunas PT, DPT ADonald ProseSugg 04/16/2020, 2:09 PM  [place] [address]Phone: [phone]  Fax:  [phone] Name: Rebecca ELGERSMAMRN: [id number] of Birth: [birth year]

## 2020-04-22 ENCOUNTER — Ambulatory Visit: Payer: Medicare Other | Admitting: Orthopaedic Surgery

## 2020-04-23 ENCOUNTER — Other Ambulatory Visit: Payer: Self-pay

## 2020-04-23 ENCOUNTER — Ambulatory Visit: Payer: Medicare Other | Admitting: Physical Therapy

## 2020-04-23 ENCOUNTER — Encounter: Payer: Self-pay | Admitting: Physical Therapy

## 2020-04-23 DIAGNOSIS — M5441 Lumbago with sciatica, right side: Secondary | ICD-10-CM

## 2020-04-23 DIAGNOSIS — M6281 Muscle weakness (generalized): Secondary | ICD-10-CM

## 2020-04-23 DIAGNOSIS — M25552 Pain in left hip: Secondary | ICD-10-CM

## 2020-04-23 DIAGNOSIS — R262 Difficulty in walking, not elsewhere classified: Secondary | ICD-10-CM

## 2020-04-23 DIAGNOSIS — G8929 Other chronic pain: Secondary | ICD-10-CM | POA: Diagnosis not present

## 2020-04-23 NOTE — Therapy (Signed)
[place]. [place], [place], [address] Phone: [phone]   Fax:  [phone]  Physical Therapy Treatment  Patient Details  Name: Rebecca [name] MRN: [id number] Date of Birth: June 24, 1932 Referring Provider (PT): Joni Fears   Encounter Date: 04/23/2020   PT End of Session - 04/23/20 1431    Visit Number 9    Date for PT Re-Evaluation 05/24/20    PT Start Time 8101    PT Stop Time 1430    PT Time Calculation (min) 27 min    Activity Tolerance Patient tolerated treatment well    Behavior During Therapy [facility] for tasks assessed/performed           Past Medical History:  Diagnosis Date  . Anemia   . Anxiety   . Diverticulitis   . Diverticulosis   . Gastroparesis   . GERD (gastroesophageal reflux disease)   . Glaucoma   . Hemorrhoids   . Hyperlipidemia   . Hypertension   . Hypertension   . IBS (irritable bowel syndrome)   . Rectal ulcer   . Segmental colitis ([place])   . Tubular adenoma of colon 05/2009    Past Surgical History:  Procedure Laterality Date  . APPENDECTOMY    . ARCUATE KERATECTOMY    . CATARACT EXTRACTION, BILATERAL  2015  . double pallital tori bone removed     . HERNIA REPAIR     umbilical  . KNEE SURGERY Bilateral   . morton's neuroma on rt foot and platar facial release    . MOUTH SURGERY     "bone shaved off roof of mouth"  . SALIVARY GLAND SURGERY     removal  . TONSILLECTOMY      There were no vitals filed for this visit.   Subjective Assessment - 04/23/20 1406    Subjective Pt reports that B hip and L leg is feeling much better. Reports that LB was 4/10 earlier after working in the kitchen for a while; took Motrin and now is 0/10.    Currently in Pain? No/denies    Pain Score 0-No pain    Pain Location Back                             [facility] Adult PT Treatment/Exercise - 04/23/20 0001      Knee/Hip Exercises: Aerobic   Nustep L5 x 6 min       Knee/Hip Exercises: Machines for Strengthening   Other Machine rows and lats 15# 2x10; standing shoulder ext 5# 2x10      Knee/Hip Exercises: Standing   Heel Raises Both;1 set;15 reps    Knee Flexion Both;1 set;10 reps    Knee Flexion Limitations 2.5#    Hip Abduction Both;1 set;10 reps    Abduction Limitations 2.5#    Hip Extension Both;1 set;10 reps    Extension Limitations 2.5#                    PT Short Term Goals - 03/04/20 1451      PT SHORT TERM GOAL #1   Title Pt will be independent with HEP    Status Achieved             PT Long Term Goals - 03/05/20 1145      PT LONG TERM GOAL #1   Title Pt will demo LLE MMT equivalent to RLE    Time 6  Period Weeks    Status On-going      PT LONG TERM GOAL #2   Title Pt will demo gait with LRAD supervision assist and minimal gait deviations    Time 6    Period Weeks    Status On-going      PT LONG TERM GOAL #3   Title Pt will report 50% reduction in L hip pain while walking    Time 6    Period Weeks    Status Partially Met                 Plan - 04/23/20 1431    Clinical Impression Statement Pt requests to end session early today d/t potential snow later today. Pt requires cuing to avoid hip compensations with standing hip ex's. Cuing for form with seated rows. Will discuss with pt potential transition into independent gym program next week. Pt likely for d/c at last scheduled rx; has follow up with MD next Thursday 1/27.    PT Treatment/Interventions ADLs/Self Care Home Management;Electrical Stimulation;Moist Heat;Iontophoresis 43m/ml Dexamethasone;Gait training;Stair training;Functional mobility training;Therapeutic activities;Therapeutic exercise;Balance training;Neuromuscular re-education;Manual techniques;Patient/family education;Passive range of motion    PT Next Visit Plan gait training with SPC, LE strength/flexibility, incorporate balance ex's    Consulted and Agree with Plan of Care  Patient           Patient will benefit from skilled therapeutic intervention in order to improve the following deficits and impairments:  Abnormal gait,Difficulty walking,Decreased endurance,Decreased activity tolerance,Pain,Decreased balance,Impaired flexibility,Decreased mobility,Decreased strength,Postural dysfunction  Visit Diagnosis: Pain in left hip  Muscle weakness (generalized)  Difficulty in walking, not elsewhere classified  Chronic bilateral low back pain with right-sided sciatica     Problem List Patient Active Problem List   Diagnosis Date Noted  . Pain in left hip 12/17/2019  . Pain in left leg 11/06/2019  . Bilateral primary osteoarthritis of knee 06/11/2019  . Pain in left shoulder 05/22/2019  . Cellulitis of left upper extremity 10/15/2016  . Segmental colitis without complication ([place] 173/66/8159 . Diverticulosis of colon without hemorrhage 02/04/2015  . Unspecified vitamin D deficiency 12/25/2013  . UTI (urinary tract infection) 09/29/2011  . 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 Atherosclerosis 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 04/20/2011  . SPINAL STENOSIS, LUMBAR 03/09/2010  . OTHER OSTEOPOROSIS 03/09/2010  . SCOLIOSIS, LUMBAR SPINE 03/09/2010  . MIGRAINE, OPHTHALMIC 10/29/2009  . LUMBAR RADICULOPATHY, ATYPICAL 10/29/2009  . POLYNEUROPATHY OTHER DISEASES CLASSIFIED ELSW 09/22/2009  . UNSPECIFIED PERIPHERAL VASCULAR DISEASE 09/22/2009  . ADENOMATOUS COLONIC POLYP 06/10/2009  . History of iron deficiency 05/06/2009  . CAROTID ARTERY DISEASE 07/28/2008  . Allergic rhinitis 03/20/2008  . DYSPHAGIA 03/12/2008  . GANGLION OF TENDON SHEATH 08/17/2007  . Hypothyroidism 11/15/2006  . Hyperlipidemia 11/07/2006  . ADVEF, DRUG/MEDICINAL/BIOLOGICAL SUBST NOS 11/07/2006  . Essential hypertension 10/18/2006  . GERD 10/18/2006  . DIVERTICULOSIS, COLON 10/18/2006   AAmador Cunas PT, DPT ADonald ProseSugg 04/23/2020, 2:35  PM  [place] [address]Phone: [phone]  Fax:  [phone] Name: Rebecca KREHERMRN: [id number] of Birth: 1Dec 01, 1934

## 2020-04-24 ENCOUNTER — Other Ambulatory Visit: Payer: Self-pay | Admitting: Family Medicine

## 2020-04-27 ENCOUNTER — Ambulatory Visit: Payer: Medicare Other | Admitting: Physical Therapy

## 2020-04-27 ENCOUNTER — Encounter: Payer: Self-pay | Admitting: Physical Therapy

## 2020-04-27 ENCOUNTER — Other Ambulatory Visit: Payer: Self-pay

## 2020-04-27 DIAGNOSIS — R262 Difficulty in walking, not elsewhere classified: Secondary | ICD-10-CM | POA: Diagnosis not present

## 2020-04-27 DIAGNOSIS — G8929 Other chronic pain: Secondary | ICD-10-CM | POA: Diagnosis not present

## 2020-04-27 DIAGNOSIS — M5441 Lumbago with sciatica, right side: Secondary | ICD-10-CM | POA: Diagnosis not present

## 2020-04-27 DIAGNOSIS — M25552 Pain in left hip: Secondary | ICD-10-CM | POA: Diagnosis not present

## 2020-04-27 DIAGNOSIS — M6281 Muscle weakness (generalized): Secondary | ICD-10-CM

## 2020-04-27 NOTE — Therapy (Signed)
[place]. [place], [place], [address] Phone: [phone]   Fax:  [phone]  Physical Therapy Treatment Progress Note Reporting Period 03/23/20 to 04/27/2020  See note below for Objective Data and Assessment of Progress/Goals.      Patient Details  Name: Rebecca [name] MRN: [id number] Date of Birth: [birth year] Referring Provider (PT): Joni Fears   Encounter Date: 04/27/2020   PT End of Session - 04/27/20 1443    Visit Number 10    Date for PT Re-Evaluation 05/24/20    PT Start Time 1401    PT Stop Time 1441    PT Time Calculation (min) 40 min    Activity Tolerance Patient tolerated treatment well    Behavior During Therapy [facility] for tasks assessed/performed           Past Medical History:  Diagnosis Date  . Anemia   . Anxiety   . Diverticulitis   . Diverticulosis   . Gastroparesis   . GERD (gastroesophageal reflux disease)   . Glaucoma   . Hemorrhoids   . Hyperlipidemia   . Hypertension   . Hypertension   . IBS (irritable bowel syndrome)   . Rectal ulcer   . Segmental colitis ([place])   . Tubular adenoma of colon 05/2009    Past Surgical History:  Procedure Laterality Date  . APPENDECTOMY    . ARCUATE KERATECTOMY    . CATARACT EXTRACTION, BILATERAL  2015  . double pallital tori bone removed     . HERNIA REPAIR     umbilical  . KNEE SURGERY Bilateral   . morton's neuroma on rt foot and platar facial release    . MOUTH SURGERY     "bone shaved off roof of mouth"  . SALIVARY GLAND SURGERY     removal  . TONSILLECTOMY      There were no vitals filed for this visit.   Subjective Assessment - 04/27/20 1403    Subjective Pt reports no pain today; feeling good    Currently in Pain? No/denies    Pain Score 0-No pain              [facility] PT Assessment - 04/27/20 0001      Strength   Right Hip Flexion 5/5    Right Hip Extension 4+/5    Right Hip ABduction 4+/5    Left Hip  Flexion 5/5    Left Hip Extension 4+/5    Left Hip ABduction 4+/5    Right Knee Flexion 5/5    Right Knee Extension 5/5    Left Knee Flexion 5/5    Left Knee Extension 5/5                         [facility] Adult PT Treatment/Exercise - 04/27/20 0001      Knee/Hip Exercises: Aerobic   Nustep L5 x 6 min    Other Aerobic L1.7 3 min each      Knee/Hip Exercises: Machines for Strengthening   Other Machine rows and lats 15# 2x10; standing shoulder ext 5# 2x10      Knee/Hip Exercises: Standing   Heel Raises Both;1 set;15 reps    Hip Abduction Both;1 set;10 reps    Hip Extension Both;1 set;10 reps      Knee/Hip Exercises: Seated   Other Seated Knee/Hip Exercises iso abs exercise ball with 3 sec hold 2x10  PT Short Term Goals - 03/04/20 1451      PT SHORT TERM GOAL #1   Title Pt will be independent with HEP    Status Achieved             PT Long Term Goals - 04/27/20 1443      PT LONG TERM GOAL #1   Title Pt will demo LLE MMT equivalent to RLE    Time 6    Period Weeks    Status Achieved      PT LONG TERM GOAL #2   Title Pt will demo gait with LRAD supervision assist and minimal gait deviations    Baseline still demos mild gait deviations esp with fatigue    Time 6    Period Weeks    Status Partially Met      PT LONG TERM GOAL #3   Title Pt will report 50% reduction in L hip pain while walking    Baseline reports 75% better    Time 6    Period Weeks    Status Achieved                 Plan - 04/27/20 1444    Clinical Impression Statement Pt making progress towards LTG. Pt demos improved LLE MMT, equivalent to RLE MMT this rx. Does still demo some functional weakness LLE. Pt also demos improved stability with gait, mostly able to ambulate with no LRAD and no LOB; still reports using SPC when fatigued or going long distances or over uneven terrain. Will discuss potential transition into independent gym program next rx.     PT Treatment/Interventions ADLs/Self Care Home Management;Electrical Stimulation;Moist Heat;Iontophoresis 62m/ml Dexamethasone;Gait training;Stair training;Functional mobility training;Therapeutic activities;Therapeutic exercise;Balance training;Neuromuscular re-education;Manual techniques;Patient/family education;Passive range of motion    PT Next Visit Plan gait training with SPC, LE strength/flexibility, incorporate balance ex's    Consulted and Agree with Plan of Care Patient           Patient will benefit from skilled therapeutic intervention in order to improve the following deficits and impairments:  Abnormal gait,Difficulty walking,Decreased endurance,Decreased activity tolerance,Pain,Decreased balance,Impaired flexibility,Decreased mobility,Decreased strength,Postural dysfunction  Visit Diagnosis: Pain in left hip  Muscle weakness (generalized)  Difficulty in walking, not elsewhere classified  Chronic bilateral low back pain with right-sided sciatica     Problem List Patient Active Problem List   Diagnosis Date Noted  . Pain in left hip 12/17/2019  . Pain in left leg 11/06/2019  . Bilateral primary osteoarthritis of knee 06/11/2019  . Pain in left shoulder 05/22/2019  . Cellulitis of left upper extremity 10/15/2016  . Segmental colitis without complication ([place] 153/74/8270 . Diverticulosis of colon without hemorrhage 02/04/2015  . Unspecified vitamin D deficiency 12/25/2013  . UTI (urinary tract infection) 09/29/2011  . 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 Atherosclerosis 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 04/20/2011  . SPINAL STENOSIS, LUMBAR 03/09/2010  . OTHER OSTEOPOROSIS 03/09/2010  . SCOLIOSIS, LUMBAR SPINE 03/09/2010  . MIGRAINE, OPHTHALMIC 10/29/2009  . LUMBAR RADICULOPATHY, ATYPICAL 10/29/2009  . POLYNEUROPATHY OTHER DISEASES CLASSIFIED ELSW 09/22/2009  . UNSPECIFIED PERIPHERAL VASCULAR DISEASE 09/22/2009  .  ADENOMATOUS COLONIC POLYP 06/10/2009  . History of iron deficiency 05/06/2009  . CAROTID ARTERY DISEASE 07/28/2008  . Allergic rhinitis 03/20/2008  . DYSPHAGIA 03/12/2008  . GANGLION OF TENDON SHEATH 08/17/2007  . Hypothyroidism 11/15/2006  . Hyperlipidemia 11/07/2006  . ADVEF, DRUG/MEDICINAL/BIOLOGICAL SUBST NOS 11/07/2006  . Essential hypertension 10/18/2006  . GERD 10/18/2006  . DIVERTICULOSIS,  COLON 10/18/2006   Amador Cunas, PT, DPT Donald Prose Laylee Schooley 04/27/2020, 2:47 PM  [place]. [place], [place], [address] Phone: [phone]   Fax:  [phone]  Name: Rebecca [name] MRN: [id number] Date of Birth: [birth year]

## 2020-04-28 ENCOUNTER — Encounter: Payer: Medicare Other | Admitting: Physical Therapy

## 2020-04-29 ENCOUNTER — Other Ambulatory Visit: Payer: Self-pay

## 2020-04-29 ENCOUNTER — Ambulatory Visit: Payer: Medicare Other | Admitting: Physical Therapy

## 2020-04-29 ENCOUNTER — Encounter: Payer: Self-pay | Admitting: Physical Therapy

## 2020-04-29 DIAGNOSIS — M5441 Lumbago with sciatica, right side: Secondary | ICD-10-CM | POA: Diagnosis not present

## 2020-04-29 DIAGNOSIS — M6281 Muscle weakness (generalized): Secondary | ICD-10-CM | POA: Diagnosis not present

## 2020-04-29 DIAGNOSIS — R262 Difficulty in walking, not elsewhere classified: Secondary | ICD-10-CM

## 2020-04-29 DIAGNOSIS — M25552 Pain in left hip: Secondary | ICD-10-CM | POA: Diagnosis not present

## 2020-04-29 DIAGNOSIS — G8929 Other chronic pain: Secondary | ICD-10-CM | POA: Diagnosis not present

## 2020-04-29 NOTE — Therapy (Signed)
[place]. [place], [place], [address] Phone: [phone]   Fax:  [phone]  Physical Therapy Treatment  Patient Details  Name: Rebecca [name] MRN: [id number] Date of Birth: [birth year] Referring Provider (PT): Joni Fears   Encounter Date: 04/29/2020   PT End of Session - 04/29/20 1357    Visit Number 11    Date for PT Re-Evaluation 05/24/20    PT Start Time 1315    PT Stop Time 1356    PT Time Calculation (min) 41 min    Activity Tolerance Patient tolerated treatment well    Behavior During Therapy [facility] for tasks assessed/performed           Past Medical History:  Diagnosis Date  . Anemia   . Anxiety   . Diverticulitis   . Diverticulosis   . Gastroparesis   . GERD (gastroesophageal reflux disease)   . Glaucoma   . Hemorrhoids   . Hyperlipidemia   . Hypertension   . Hypertension   . IBS (irritable bowel syndrome)   . Rectal ulcer   . Segmental colitis ([place])   . Tubular adenoma of colon 05/2009    Past Surgical History:  Procedure Laterality Date  . APPENDECTOMY    . ARCUATE KERATECTOMY    . CATARACT EXTRACTION, BILATERAL  2015  . double pallital tori bone removed     . HERNIA REPAIR     umbilical  . KNEE SURGERY Bilateral   . morton's neuroma on rt foot and platar facial release    . MOUTH SURGERY     "bone shaved off roof of mouth"  . SALIVARY GLAND SURGERY     removal  . TONSILLECTOMY      There were no vitals filed for this visit.   Subjective Assessment - 04/29/20 1321    Subjective Pt reports some achiness in back d/t grocery shopping yesterday but otherwise doing well. Pt reports that she has been having trouble getting into her son's tall pickup truck.    Currently in Pain? Yes    Pain Score 1     Pain Location Back                             [facility] Adult PT Treatment/Exercise - 04/29/20 0001      High Level Balance   High Level Balance  Activities Side stepping;Backward walking;Tandem walking    High Level Balance Comments sidestepping on foam, tandem and bkwds walking firm surface      Knee/Hip Exercises: Aerobic   Nustep L5 x 6 min    Other Aerobic L1.7 3 min each      Knee/Hip Exercises: Machines for Strengthening   Other Machine rows and lats 15# 2x10; standing shoulder ext 5# 2x10      Knee/Hip Exercises: Standing   Hip Abduction Both;1 set;10 reps    Abduction Limitations 2#    Hip Extension Both;1 set;10 reps    Extension Limitations 2#      Knee/Hip Exercises: Seated   Sit to Sand 2 sets;10 reps;without UE support   with yellow ball                   PT Short Term Goals - 03/04/20 1451      PT SHORT TERM GOAL #1   Title Pt will be independent with HEP    Status Achieved  PT Long Term Goals - 04/27/20 1443      PT LONG TERM GOAL #1   Title Pt will demo LLE MMT equivalent to RLE    Time 6    Period Weeks    Status Achieved      PT LONG TERM GOAL #2   Title Pt will demo gait with LRAD supervision assist and minimal gait deviations    Baseline still demos mild gait deviations esp with fatigue    Time 6    Period Weeks    Status Partially Met      PT LONG TERM GOAL #3   Title Pt will report 50% reduction in L hip pain while walking    Baseline reports 75% better    Time 6    Period Weeks    Status Achieved                 Plan - 04/29/20 1357    Clinical Impression Statement Pt recommended for d/c today secondary to meeting most rehab goals and being pleased with functional progress. Updated HEP and pt demos and VU. Corrections to pt form, verbal/tactile cuing to avoid hip compensations. Pt not ready to begin with independent gym program; would like to wait until it gets a little warmer. States she will continue exercises at home until then. Educated on when to return if symptoms worsen or recur with pt VU.    PT Treatment/Interventions ADLs/Self Care Home  Management;Electrical Stimulation;Moist Heat;Iontophoresis 85m/ml Dexamethasone;Gait training;Stair training;Functional mobility training;Therapeutic activities;Therapeutic exercise;Balance training;Neuromuscular re-education;Manual techniques;Patient/family education;Passive range of motion    PT Next Visit Plan discharged with updated HEP    Consulted and Agree with Plan of Care Patient           Patient will benefit from skilled therapeutic intervention in order to improve the following deficits and impairments:     Visit Diagnosis: Pain in left hip  Muscle weakness (generalized)  Difficulty in walking, not elsewhere classified     Problem List Patient Active Problem List   Diagnosis Date Noted  . Pain in left hip 12/17/2019  . Pain in left leg 11/06/2019  . Bilateral primary osteoarthritis of knee 06/11/2019  . Pain in left shoulder 05/22/2019  . Cellulitis of left upper extremity 10/15/2016  . Segmental colitis without complication ([place] 150/35/4656 . Diverticulosis of colon without hemorrhage 02/04/2015  . Unspecified vitamin D deficiency 12/25/2013  . UTI (urinary tract infection) 09/29/2011  . 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 Atherosclerosis 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 04/20/2011  . SPINAL STENOSIS, LUMBAR 03/09/2010  . OTHER OSTEOPOROSIS 03/09/2010  . SCOLIOSIS, LUMBAR SPINE 03/09/2010  . MIGRAINE, OPHTHALMIC 10/29/2009  . LUMBAR RADICULOPATHY, ATYPICAL 10/29/2009  . POLYNEUROPATHY OTHER DISEASES CLASSIFIED ELSW 09/22/2009  . UNSPECIFIED PERIPHERAL VASCULAR DISEASE 09/22/2009  . ADENOMATOUS COLONIC POLYP 06/10/2009  . History of iron deficiency 05/06/2009  . CAROTID ARTERY DISEASE 07/28/2008  . Allergic rhinitis 03/20/2008  . DYSPHAGIA 03/12/2008  . GANGLION OF TENDON SHEATH 08/17/2007  . Hypothyroidism 11/15/2006  . Hyperlipidemia 11/07/2006  . ADVEF, DRUG/MEDICINAL/BIOLOGICAL SUBST NOS 11/07/2006  . Essential  hypertension 10/18/2006  . GERD 10/18/2006  . DIVERTICULOSIS, COLON 10/18/2006  PHYSICAL THERAPY DISCHARGE SUMMARY  Visits from Start of Care: 11  Plan: Patient agrees to discharge.  Patient goals were partially met. Patient is being discharged due to being pleased with the current functional level.  ?????     AAmador Cunas PT, DPT ADonald ProseSugg 04/29/2020, 1:59 PM  [place]. [place], [place], [address] Phone: [phone]   Fax:  [phone]  Name: Rebecca [name] MRN: [id number] Date of Birth: 07-29-1932

## 2020-04-30 ENCOUNTER — Ambulatory Visit (INDEPENDENT_AMBULATORY_CARE_PROVIDER_SITE_OTHER): Payer: Medicare Other | Admitting: Orthopaedic Surgery

## 2020-04-30 ENCOUNTER — Encounter: Payer: Self-pay | Admitting: Orthopaedic Surgery

## 2020-04-30 DIAGNOSIS — M79605 Pain in left leg: Secondary | ICD-10-CM | POA: Diagnosis not present

## 2020-04-30 DIAGNOSIS — M25552 Pain in left hip: Secondary | ICD-10-CM

## 2020-04-30 NOTE — Progress Notes (Signed)
Office Visit Note   Patient: Rebecca [name]           Date of Birth: July 26, 1932           MRN: [id number] Visit Date: 04/30/2020              Requested by: [name], MD [place],  [place] [address] PCP: [name], MD   Assessment & Plan: Visit Diagnoses:  1. Pain in left hip   2. Pain in left leg     Plan: Rebecca [name] relates that she no longer is having any problem with her left hip or left leg.  She is not using any ambulatory aid.  She completed a course of physical therapy and thinks it made a big difference.  She has had an MRI scan that demonstrated some areas of tendinitis about her hip and that presumably was the cause of her pain.  It also seems that had has completely healed.  I have encouraged her to continue with her exercises at home and plan to see her back as needed.  Follow-Up Instructions: Return if symptoms worsen or fail to improve.   Orders:  No orders of the defined types were placed in this encounter.  No orders of the defined types were placed in this encounter.     Procedures: No procedures performed   Clinical Data: No additional findings.   Subjective: Chief Complaint  Patient presents with  . Left Hip - Pain, Follow-up  Rebecca [name] is been followed over number of months for the problem referable to her left hip and leg.  MRI scan did not reveal any abnormalities except for some tendinitis and partial glutei tearing.  She is finished a course of therapy and thinks it made a big difference.  She is not taking anything for pain no using any ambulatory aid.  At one point months ago there was a possible nondisplaced greater trochanteric fracture left hip.  Follow-up MRI scan  HPI  Review of Systems   Objective: Vital Signs: There were no vitals taken for this visit.  Physical Exam Constitutional:      Appearance: She is well-developed and well-nourished.  HENT:     Mouth/Throat:     Mouth:  Oropharynx is clear and moist.  Eyes:     Extraocular Movements: EOM normal.     Pupils: Pupils are equal, round, and reactive to light.  Pulmonary:     Effort: Pulmonary effort is normal.  Skin:    General: Skin is warm and dry.  Neurological:     Mental Status: She is alert and oriented to person, place, and time.  Psychiatric:        Mood and Affect: Mood and affect normal.        Behavior: Behavior normal.     Ortho Exam awake alert oriented x3 and in no acute distress.  No use of ambulatory aid.  Able to get up and down easily from a sitting position.  No pain with range of motion of her left hip.  No localized areas of tenderness about her left hip.  Motor exam appears to be intact Specialty Comments:  No specialty comments available.  Imaging: No results found.   PMFS History: Patient Active Problem List   Diagnosis Date Noted  . Pain in left hip 12/17/2019  . Pain in left leg 11/06/2019  . Bilateral primary osteoarthritis of knee 06/11/2019  . Pain in left shoulder 05/22/2019  .  Cellulitis of left upper extremity 10/15/2016  . 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 Diverticulosis of colon without hemorrhage 02/04/2015  . Unspecified vitamin D deficiency 12/25/2013  . UTI (urinary tract infection) 09/29/2011  . 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 Atherosclerosis 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 04/20/2011  . SPINAL STENOSIS, LUMBAR 03/09/2010  . OTHER OSTEOPOROSIS 03/09/2010  . SCOLIOSIS, LUMBAR SPINE 03/09/2010  . MIGRAINE, OPHTHALMIC 10/29/2009  . LUMBAR RADICULOPATHY, ATYPICAL 10/29/2009  . POLYNEUROPATHY OTHER DISEASES CLASSIFIED ELSW 09/22/2009  . UNSPECIFIED PERIPHERAL VASCULAR DISEASE 09/22/2009  . ADENOMATOUS COLONIC POLYP 06/10/2009  . History of iron deficiency 05/06/2009  . CAROTID ARTERY DISEASE 07/28/2008  . Allergic rhinitis 03/20/2008  . DYSPHAGIA 03/12/2008  . GANGLION OF TENDON SHEATH  08/17/2007  . Hypothyroidism 11/15/2006  . Hyperlipidemia 11/07/2006  . ADVEF, DRUG/MEDICINAL/BIOLOGICAL SUBST NOS 11/07/2006  . Essential hypertension 10/18/2006  . GERD 10/18/2006  . DIVERTICULOSIS, COLON 10/18/2006   Past Medical History:  Diagnosis Date  . Anemia   . Anxiety   . Diverticulitis   . Diverticulosis   . Gastroparesis   . GERD (gastroesophageal reflux disease)   . Glaucoma   . Hemorrhoids   . Hyperlipidemia   . Hypertension   . Hypertension   . IBS (irritable bowel syndrome)   . Rectal ulcer   . Segmental colitis ([place])   . Tubular adenoma of colon 05/2009    Family History  Problem Relation Age of Onset  . Diabetes Mother   . Heart disease Mother   . Hyperlipidemia Mother   . Hypertension Mother   . Anuerysm Father   . Heart disease Father   . Hypertension Father   . Migraines Daughter   . Kidney Stones Daughter   . Colon polyps Sister   . Lung cancer Sister   . Colon polyps Brother   . Diabetes Brother   . Hyperlipidemia Brother   . Hypertension Brother   . Colon polyps Daughter   . Breast cancer Daughter   . Colon cancer Maternal Aunt        Rectal cancer  . Cancer Daughter        salivary  . Colon cancer Paternal Grandmother        with possible stomach cancer  . Heart disease Brother   . Crohn's disease Neg Hx   . Pancreatic cancer Neg Hx     Past Surgical History:  Procedure Laterality Date  . APPENDECTOMY    . ARCUATE KERATECTOMY    . CATARACT EXTRACTION, BILATERAL  2015  . double pallital tori bone removed     . HERNIA REPAIR     umbilical  . KNEE SURGERY Bilateral   . morton's neuroma on rt foot and platar facial release    . MOUTH SURGERY     "bone shaved off roof of mouth"  . SALIVARY GLAND SURGERY     removal  . TONSILLECTOMY     Social History   Occupational History  . Occupation: Glass blower/designer    Comment: retired  Tobacco Use  . Smoking status: Never Smoker  . Smokeless tobacco: Never Used  Vaping Use  .  Vaping Use: Never used  Substance and Sexual Activity  . Alcohol use: Yes    Alcohol/week: 0.0 standard drinks    Comment: rare  . Drug use: No  . Sexual activity: Yes     Garald Balding, MD   Note - This record has been created using [employer].  Chart creation errors have been sought, but may not always  have been located. Such creation errors do not reflect on  the standard of medical care.

## 2020-05-01 DIAGNOSIS — H401132 Primary open-angle glaucoma, bilateral, moderate stage: Secondary | ICD-10-CM | POA: Diagnosis not present

## 2020-05-04 ENCOUNTER — Other Ambulatory Visit: Payer: Self-pay | Admitting: Family Medicine

## 2020-05-04 DIAGNOSIS — K2100 Gastro-esophageal reflux disease with esophagitis, without bleeding: Secondary | ICD-10-CM

## 2020-05-07 ENCOUNTER — Other Ambulatory Visit: Payer: Self-pay | Admitting: Family Medicine

## 2020-05-08 NOTE — Addendum Note (Signed)
Addended by: Marrion Coy on: 05/08/2020 04:00 PM   Modules accepted: Orders

## 2020-05-11 ENCOUNTER — Telehealth: Payer: Self-pay | Admitting: Family Medicine

## 2020-05-13 ENCOUNTER — Other Ambulatory Visit: Payer: Self-pay

## 2020-05-13 ENCOUNTER — Other Ambulatory Visit (INDEPENDENT_AMBULATORY_CARE_PROVIDER_SITE_OTHER): Payer: Medicare Other

## 2020-05-13 DIAGNOSIS — I10 Essential (primary) hypertension: Secondary | ICD-10-CM

## 2020-05-13 DIAGNOSIS — E785 Hyperlipidemia, unspecified: Secondary | ICD-10-CM

## 2020-05-13 LAB — LIPID PANEL
Cholesterol: 175 mg/dL (ref 0–200)
HDL: 44.4 mg/dL (ref >39.00)
NonHDL: 130.98
Total CHOL/HDL Ratio: 4
Triglycerides: 237 mg/dL — ABNORMAL HIGH (ref 0.0–149.0)
VLDL: 47.4 mg/dL — ABNORMAL HIGH (ref 0.0–40.0)

## 2020-05-13 LAB — LDL CHOLESTEROL, DIRECT: Direct LDL: 89 mg/dL

## 2020-05-14 DIAGNOSIS — N760 Acute vaginitis: Secondary | ICD-10-CM | POA: Diagnosis not present

## 2020-05-14 DIAGNOSIS — Z4689 Encounter for fitting and adjustment of other specified devices: Secondary | ICD-10-CM | POA: Diagnosis not present

## 2020-05-14 DIAGNOSIS — N816 Rectocele: Secondary | ICD-10-CM | POA: Diagnosis not present

## 2020-05-15 DIAGNOSIS — Z780 Asymptomatic menopausal state: Secondary | ICD-10-CM | POA: Diagnosis not present

## 2020-05-15 DIAGNOSIS — M85852 Other specified disorders of bone density and structure, left thigh: Secondary | ICD-10-CM | POA: Diagnosis not present

## 2020-05-15 DIAGNOSIS — M810 Age-related osteoporosis without current pathological fracture: Secondary | ICD-10-CM | POA: Diagnosis not present

## 2020-05-15 DIAGNOSIS — M85851 Other specified disorders of bone density and structure, right thigh: Secondary | ICD-10-CM | POA: Diagnosis not present

## 2020-05-20 DIAGNOSIS — H401112 Primary open-angle glaucoma, right eye, moderate stage: Secondary | ICD-10-CM | POA: Diagnosis not present

## 2020-05-26 NOTE — Telephone Encounter (Signed)
Patient called needing a refill on simvastatin (ZOCOR) 20 MG tablet  She was taking 2 instead of 1 and she needs the 40 mg to be sent in instead of the 20 mg.  [id number] [place] [address], [place] Phone:  [phone]  Fax:  [phone]

## 2020-05-27 MED ORDER — SIMVASTATIN 40 MG PO TABS: 40.0000 mg | ORAL_TABLET | Freq: Every day | ORAL | 3 refills | Status: DC

## 2020-05-27 NOTE — Addendum Note (Signed)
Addended by: [name] on: 05/27/2020 07:36 AM   Modules accepted: Orders

## 2020-06-02 DIAGNOSIS — N762 Acute vulvitis: Secondary | ICD-10-CM | POA: Diagnosis not present

## 2020-06-02 DIAGNOSIS — N898 Other specified noninflammatory disorders of vagina: Secondary | ICD-10-CM | POA: Diagnosis not present

## 2020-06-02 DIAGNOSIS — N8110 Cystocele, unspecified: Secondary | ICD-10-CM | POA: Diagnosis not present

## 2020-06-02 DIAGNOSIS — N760 Acute vaginitis: Secondary | ICD-10-CM | POA: Diagnosis not present

## 2020-06-05 DIAGNOSIS — M810 Age-related osteoporosis without current pathological fracture: Secondary | ICD-10-CM | POA: Diagnosis not present

## 2020-06-11 DIAGNOSIS — Z4689 Encounter for fitting and adjustment of other specified devices: Secondary | ICD-10-CM | POA: Diagnosis not present

## 2020-06-11 DIAGNOSIS — N816 Rectocele: Secondary | ICD-10-CM | POA: Diagnosis not present

## 2020-06-11 DIAGNOSIS — N8110 Cystocele, unspecified: Secondary | ICD-10-CM | POA: Diagnosis not present

## 2020-06-17 ENCOUNTER — Other Ambulatory Visit: Payer: Self-pay | Admitting: Family Medicine

## 2020-06-18 ENCOUNTER — Other Ambulatory Visit: Payer: Self-pay

## 2020-06-19 ENCOUNTER — Ambulatory Visit (INDEPENDENT_AMBULATORY_CARE_PROVIDER_SITE_OTHER): Payer: Medicare Other | Admitting: Family Medicine

## 2020-06-19 ENCOUNTER — Encounter: Payer: Self-pay | Admitting: Family Medicine

## 2020-06-19 VITALS — BP 120/74 | HR 73 | Temp 98.3°F | Ht 62.0 in | Wt 123.2 lb

## 2020-06-19 DIAGNOSIS — E785 Hyperlipidemia, unspecified: Secondary | ICD-10-CM

## 2020-06-19 DIAGNOSIS — I779 Disorder of arteries and arterioles, unspecified: Secondary | ICD-10-CM

## 2020-06-19 DIAGNOSIS — I10 Essential (primary) hypertension: Secondary | ICD-10-CM | POA: Diagnosis not present

## 2020-06-19 MED ORDER — ROSUVASTATIN CALCIUM 20 MG PO TABS: 20.0000 mg | ORAL_TABLET | Freq: Every day | ORAL | 5 refills | Status: DC

## 2020-06-19 NOTE — Patient Instructions (Signed)

## 2020-06-19 NOTE — Progress Notes (Signed)
Established Patient Office Visit  Subjective:  Patient ID: Rebecca [name], female    DOB: [birth year]  Age: 87 y.o. MRN: [id number]  CC:  Chief Complaint  Patient presents with  . Follow-up    HPI Rebecca [name] presents for medical follow-up.  She has history of peripheral artery disease, hypertension, GERD, hypothyroidism, polyneuropathy, osteoporosis, scoliosis of the lumbar spine, lumbar spinal stenosis.  She had been on simvastatin 40 mg daily.  She states that her legs felt very fatigued with things like climbing stairs.  Really no significant major pain issues.  She thought this may be statin related and she stopped simvastatin on her own.  This was about 3 or 4 weeks ago.  She has not noted any change in leg symptoms since then.  Does not have difficulty getting out of a chair.  No proximal muscle weakness in her arms.  She still does have some concerns whether her symptoms are statin related.  No major myalgias.  She has not had carotid Doppler since 2019.  She has history of known peripheral artery disease.  No recent TIA symptoms.  She has hypothyroidism on replacement.  Recent TSH at goal from 3 months ago.  This was reviewed.  She remains on levothyroxine 88 mcg daily and compliant with therapy  Past Medical History:  Diagnosis Date  . Anemia   . Anxiety   . Diverticulitis   . Diverticulosis   . Gastroparesis   . GERD (gastroesophageal reflux disease)   . Glaucoma   . Hemorrhoids   . Hyperlipidemia   . Hypertension   . Hypertension   . IBS (irritable bowel syndrome)   . Rectal ulcer   . Segmental colitis ([place])   . Tubular adenoma of colon 05/2009    Past Surgical History:  Procedure Laterality Date  . APPENDECTOMY    . ARCUATE KERATECTOMY    . CATARACT EXTRACTION, BILATERAL  2015  . double pallital tori bone removed     . HERNIA REPAIR     umbilical  . KNEE SURGERY Bilateral   . morton's neuroma on rt foot and platar facial release    . MOUTH  SURGERY     "bone shaved off roof of mouth"  . SALIVARY GLAND SURGERY     removal  . TONSILLECTOMY      Family History  Problem Relation Age of Onset  . Diabetes Mother   . Heart disease Mother   . Hyperlipidemia Mother   . Hypertension Mother   . Anuerysm Father   . Heart disease Father   . Hypertension Father   . Migraines Daughter   . Kidney Stones Daughter   . Colon polyps Sister   . Lung cancer Sister   . Colon polyps Brother   . Diabetes Brother   . Hyperlipidemia Brother   . Hypertension Brother   . Colon polyps Daughter   . Breast cancer Daughter   . Colon cancer Maternal Aunt        Rectal cancer  . Cancer Daughter        salivary  . Colon cancer Paternal Grandmother        with possible stomach cancer  . Heart disease Brother   . Crohn's disease Neg Hx   . Pancreatic cancer Neg Hx     Social History   Socioeconomic History  . Marital status: Widowed    Spouse name: Not on file  . Number of children: 3  . Years of education:  Not on file  . Highest education level: Not on file  Occupational History  . Occupation: Glass blower/designer    Comment: retired  Tobacco Use  . Smoking status: Never Smoker  . Smokeless tobacco: Never Used  Vaping Use  . Vaping Use: Never used  Substance and Sexual Activity  . Alcohol use: Yes    Alcohol/week: 0.0 standard drinks    Comment: rare  . Drug use: No  . Sexual activity: Yes  Other Topics Concern  . 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 Difficulty of Paying Living Expenses: Not hard at all  Food Insecurity: No Food Insecurity  . Worried About Sales executive in the Last Year: Never true  . Ran Out of Food in the Last Year: Never true  Transportation Needs: No Transportation Needs  . Lack of Transportation (Medical): No  . Lack of Transportation (Non-Medical): No  Physical Activity: Insufficiently Active  . Days of Exercise per Week: 7 days  . Minutes of Exercise per Session: 20 min  Stress: No Stress Concern Present  . Feeling of Stress : Not at all  Social Connections: Moderately Isolated  . Frequency of Communication with Friends and Family: More than three times a week  . Frequency of Social Gatherings with Friends and Family: More than three times a week  . Attends Religious Services: More than 4 times per year  . Active Member of Clubs or Organizations: No  . Attends Archivist Meetings: Never  . Marital Status: Widowed  Intimate Partner Violence: Not At Risk  . Fear of Current or Ex-Partner: No  . Emotionally Abused: No  . Physically Abused: No  . Sexually Abused: No    Outpatient Medications Prior to Visit  Medication Sig Dispense Refill  . ALPRAZolam (XANAX) 0.25 MG tablet TAKE 1 TABLET(0.25 MG) BY MOUTH AT BEDTIME AS NEEDED 20 tablet 0  . Calcium Carb-Cholecalciferol (CALCIUM-VITAMIN D) 600-400 MG-UNIT TABS Take 1 tablet by mouth daily.    . Cholecalciferol (VITAMIN D3) 2000 UNITS TABS Take 1 tablet by mouth daily.    . diclofenac sodium (VOLTAREN) 1 % GEL Apply 2 g topically 4 (four) times daily. 1 Tube 1  . esomeprazole (NEXIUM) 20 MG capsule Take 20 mg by mouth 2 (two) times daily before a meal.    . gabapentin (NEURONTIN) 100 MG capsule TAKE 2 CAPSULES(200 MG) BY MOUTH AT BEDTIME 180 capsule 0  . Latanoprostene Bunod (VYZULTA) 0.024 % SOLN Apply to eye.    . levothyroxine (SYNTHROID) 88 MCG tablet TAKE 1 TABLET(88 MCG) BY MOUTH DAILY 90 tablet 1  . losartan (COZAAR) 100 MG tablet TAKE 1 TABLET BY MOUTH EVERY DAY 90 tablet 1  . metoCLOPramide (REGLAN) 5 MG tablet TAKE 1 TABLET BY MOUTH TWICE DAILY WITH MEALS  60 tablet 3  . vitamin B-12 (CYANOCOBALAMIN) 500 MCG tablet Take 1,000 mcg by mouth every morning.    . vitamin C (ASCORBIC ACID) 500 MG tablet Take 500 mg by mouth daily.    . simvastatin (ZOCOR) 40  MG tablet Take 1 tablet (40 mg total) by mouth at bedtime. 90 tablet 3  . timolol (TIMOPTIC) 0.5 % ophthalmic solution INT 1 GTT IN OU BID    . predniSONE (DELTASONE) 10 MG tablet Taper as follows: 4-4-4-3-3-2-2-1-1 24 tablet 0   No facility-administered medications prior to visit.    Allergies  Allergen Reactions  . Levaquin [Levofloxacin] Other (See Comments)    Very nauseous and shakey  . Sulfamethoxazole-Trimethoprim Nausea Only    shakey  . Lactose Intolerance (Gi) Diarrhea and Other (See Comments)    Stomach hurts   . Tramadol Hcl Nausea Only and Palpitations  . Tramadol Hcl Nausea Only and Palpitations  . Augmentin [Amoxicillin-Pot Clavulanate] Diarrhea  . Ciprofloxacin Nausea Only    REACTION: Thrush, Nausea, Shakey  . Pneumococcal Vaccine Polyvalent     REACTION: RED AND RAISED RASH ON ARM  . Adhesive [Tape] Rash  . Codeine Nausea Only    ROS Review of Systems  Constitutional: Positive for fatigue. Negative for appetite change and unexpected weight change.  Respiratory: Negative for cough and shortness of breath.   Cardiovascular: Negative for chest pain.  Gastrointestinal: Negative for abdominal pain.  Genitourinary: Negative for dysuria.      Objective:    Physical Exam Vitals reviewed.  Cardiovascular:     Rate and Rhythm: Normal rate.  Pulmonary:     Effort: Pulmonary effort is normal.     Breath sounds: Normal breath sounds.  Musculoskeletal:     Right lower leg: No edema.     Left lower leg: No edema.  Neurological:     Mental Status: She is alert.     BP 120/74   Pulse 73   Temp 98.3 F (36.8 C) (Oral)   Ht 5\' 2"  (1.575 m)   Wt 123 lb 4 oz (55.9 kg)   SpO2 97%   BMI 22.54 kg/m  Wt Readings from Last 3 Encounters:  06/19/20 123 lb 4 oz  (55.9 kg)  03/20/20 123 lb 14.4 oz (56.2 kg)  03/17/20 122 lb (55.3 kg)     There are no preventive care reminders to display for this patient.  There are no preventive care reminders to display for this patient.  Lab Results  Component Value Date   TSH 0.73 03/20/2020   Lab Results  Component Value Date   WBC 11.5 (H) 05/01/2018   HGB 12.6 05/01/2018   HCT 37.7 05/01/2018   MCV 100.6 (H) 05/01/2018   PLT 226.0 05/01/2018   Lab Results  Component Value Date   NA 133 (L) 03/20/2020   K 4.6 03/20/2020   CO2 27 03/20/2020   GLUCOSE 101 (H) 03/20/2020   BUN 12 03/20/2020   CREATININE 0.90 (H) 03/20/2020   BILITOT 0.6 03/20/2020   ALKPHOS 59 02/15/2019   AST 18 03/20/2020   ALT 10 03/20/2020   PROT 6.3 03/20/2020   ALBUMIN 4.1 02/15/2019   CALCIUM 10.1 03/20/2020   ANIONGAP 7 02/23/2016   GFR 59.65 (L) 02/15/2019   Lab Results  Component Value Date   CHOL 175 05/13/2020   Lab Results  Component Value Date   HDL 44.40 05/13/2020   Lab Results  Component Value Date   LDLCALC 127 (H) 03/20/2020   Lab Results  Component Value Date   TRIG 237.0 (H) 05/13/2020   Lab Results  Component Value Date   CHOLHDL 4 05/13/2020   Lab Results  Component Value Date   HGBA1C 5.6 07/26/2006  Assessment & Plan:   #1 dyslipidemia.  Known history of peripheral artery disease.  LDL goal less than 70.  Patient currently not taking statin.  She has concerns for possible simvastatin related muscle fatigue but not clear this is a factor  -She does agree to changing over to Crestor 20 mg daily and recommend follow-up for lipids in 2 months along with hepatic panel  #2 peripheral artery disease. -Set up repeat carotid Dopplers which have not been done in a couple years  #3 hypertension stable and at goal.  Continue losartan 100 mg daily  Meds ordered this encounter  Medications  . rosuvastatin (CRESTOR) 20 MG tablet    Sig: Take 1 tablet (20 mg total) by mouth daily.     Dispense:  30 tablet    Refill:  5    Follow-up: No follow-ups on file.    Carolann Littler, MD

## 2020-06-22 ENCOUNTER — Ambulatory Visit (HOSPITAL_COMMUNITY)
Admission: RE | Admit: 2020-06-22 | Discharge: 2020-06-22 | Disposition: A | Discharge status: Home / Self Care | Payer: Medicare Other | Source: Ambulatory Visit | Attending: Internal Medicine | Admitting: Internal Medicine

## 2020-06-22 ENCOUNTER — Other Ambulatory Visit: Payer: Self-pay

## 2020-06-22 ENCOUNTER — Other Ambulatory Visit: Payer: Self-pay | Admitting: Family Medicine

## 2020-06-22 DIAGNOSIS — I779 Disorder of arteries and arterioles, unspecified: Secondary | ICD-10-CM

## 2020-06-22 DIAGNOSIS — I6523 Occlusion and stenosis of bilateral carotid arteries: Secondary | ICD-10-CM

## 2020-06-25 ENCOUNTER — Telehealth: Payer: Self-pay | Admitting: Pharmacist

## 2020-06-25 NOTE — Chronic Care Management (AMB) (Signed)
Chronic Care Management Pharmacy Assistant   Name: Rebecca [name]  MRN: [id number] DOB: 02-23-1933  Reason for Encounter: Disease State   Conditions to be addressed/monitored: HLD  Recent office visits:  . 03.18.2022 [name], MD Family Medicine o Discontinued - Simvastatin 40 mg o Started- Crestor 20 mg . 12.17.2022 [name], MD Family Medicine o Prednisone 10 mg -Taper  Recent consult visits:  o 12.14.2021 Garald Balding, [place] Hospital visits:  None in previous 6 months  Medications: Outpatient Encounter Medications as of 06/25/2020  Medication Sig  . ALPRAZolam (XANAX) 0.25 MG tablet TAKE 1 TABLET(0.25 MG) BY MOUTH AT BEDTIME AS NEEDED  . Calcium Carb-Cholecalciferol (CALCIUM-VITAMIN D) 600-400 MG-UNIT TABS Take 1 tablet by mouth daily.  . Cholecalciferol (VITAMIN D3) 2000 UNITS TABS Take 1 tablet by mouth daily.  . diclofenac sodium (VOLTAREN) 1 % GEL Apply 2 g topically 4 (four) times daily.  [name] Kitchen esomeprazole (NEXIUM) 20 MG capsule Take 20 mg by mouth 2 (two) times daily before a meal.  . gabapentin (NEURONTIN) 100 MG capsule TAKE 2 CAPSULES(200 MG) BY MOUTH AT BEDTIME  . Latanoprostene Bunod (VYZULTA) 0.024 % SOLN Apply to eye.  . levothyroxine (SYNTHROID) 88 MCG tablet TAKE 1 TABLET(88 MCG) BY MOUTH DAILY  . losartan (COZAAR) 100 MG tablet TAKE 1 TABLET BY MOUTH EVERY DAY  . metoCLOPramide (REGLAN) 5 MG tablet TAKE 1 TABLET BY MOUTH TWICE DAILY WITH MEALS  . rosuvastatin (CRESTOR) 20 MG tablet Take 1 tablet (20 mg total) by mouth daily.  . vitamin B-12 (CYANOCOBALAMIN) 500 MCG tablet Take 1,000 mcg by mouth every morning.  . vitamin C (ASCORBIC ACID) 500 MG tablet Take 500 mg by mouth daily.  . [DISCONTINUED] olmesartan (BENICAR) 20 MG tablet Take 20 mg by mouth daily.     No facility-administered encounter medications on file as of 06/25/2020.   06/25/2020 Name: Rebecca [name] MRN: [id number] DOB: February 25, 1933 Rebecca [name] is a 87 y.o. year old female who is a primary care patient of Burchette, Alinda Sierras, MD.  Comprehensive medication review performed; Spoke to patient regarding cholesterol  Lipid Panel    Component Value Date/Time   CHOL 175 05/13/2020 0910   TRIG 237.0 (H) 05/13/2020 0910   HDL 44.40 05/13/2020 0910   LDLCALC 127 (H) 03/20/2020 1121   LDLDIRECT 89.0 05/13/2020 0910    . Current antihyperlipidemic regimen:  o Crestor  20 mg . Previous antihyperlipidemic medications tried: Simvastatin 40 mg at bedtime . ASCVD risk enhancing conditions: HTN . What recent interventions/DTPs have been made by any provider to improve Cholesterol control since last CPP Visit:  o Discontinued Simvastatin 40 mg o Started Crestor 20 mg day o Follow-up with labs in 2 months . Any recent hospitalizations or ED visits since last visit with CPP? No . What diet changes have been made to improve Cholesterol?  o Watching the fatty food in her diet . What exercise is being done to improve Cholesterol?  o No change  Adherence Review: Does the patient have >5 day gap between last estimated fill dates? No  I spoke with the patient and discussed medication adherence. There are no issues currently with the current medication. She recently followed up with her PCP.  Simvastatin 40 mg was discontinued, and Crestor 20 mg was started. She has had no other changes in her medications. She will follow up with labs in two months to see the effect of the new medication.  She denies any emergency department visits since her last CPP or PCP follow-up. She denies any side effects from her medication. Also, she denies any problems with her current pharmacy.  Star Rating Drugs:  Dispensed Quantity Pharmacy  Losartan 100 mg 03.16.2022 90 [facility]  Rosuvastatin 10 mg 03.18.2022 90 [facility]   Amilia Revonda Standard, [place] Assistant [phone]

## 2020-07-20 ENCOUNTER — Other Ambulatory Visit: Payer: Self-pay | Admitting: Family Medicine

## 2020-07-20 NOTE — Telephone Encounter (Signed)
Last filled 04/26/2020 Last OV 06/19/2020  Ok to fill?

## 2020-07-22 DIAGNOSIS — Z23 Encounter for immunization: Secondary | ICD-10-CM | POA: Diagnosis not present

## 2020-07-29 DIAGNOSIS — H401122 Primary open-angle glaucoma, left eye, moderate stage: Secondary | ICD-10-CM | POA: Diagnosis not present

## 2020-08-13 DIAGNOSIS — L821 Other seborrheic keratosis: Secondary | ICD-10-CM | POA: Diagnosis not present

## 2020-08-13 DIAGNOSIS — L820 Inflamed seborrheic keratosis: Secondary | ICD-10-CM | POA: Diagnosis not present

## 2020-08-13 DIAGNOSIS — Z85828 Personal history of other malignant neoplasm of skin: Secondary | ICD-10-CM | POA: Diagnosis not present

## 2020-08-20 ENCOUNTER — Encounter: Payer: Self-pay | Admitting: Family Medicine

## 2020-08-20 ENCOUNTER — Other Ambulatory Visit: Payer: Medicare Other

## 2020-08-21 ENCOUNTER — Other Ambulatory Visit (INDEPENDENT_AMBULATORY_CARE_PROVIDER_SITE_OTHER): Payer: Medicare Other

## 2020-08-21 ENCOUNTER — Other Ambulatory Visit: Payer: Self-pay

## 2020-08-21 DIAGNOSIS — E785 Hyperlipidemia, unspecified: Secondary | ICD-10-CM | POA: Diagnosis not present

## 2020-08-21 LAB — HEPATIC FUNCTION PANEL
ALT: 13 U/L (ref 0–35)
AST: 17 U/L (ref 0–37)
Albumin: 4.1 g/dL (ref 3.5–5.2)
Alkaline Phosphatase: 71 U/L (ref 39–117)
Bilirubin, Direct: 0.1 mg/dL (ref 0.0–0.3)
Total Bilirubin: 0.6 mg/dL (ref 0.2–1.2)
Total Protein: 6.2 g/dL (ref 6.0–8.3)

## 2020-08-21 LAB — LIPID PANEL
Cholesterol: 132 mg/dL (ref 0–200)
HDL: 39.2 mg/dL (ref >39.00)
LDL Cholesterol: 55 mg/dL (ref 0–99)
NonHDL: 92.57
Total CHOL/HDL Ratio: 3
Triglycerides: 188 mg/dL — ABNORMAL HIGH (ref 0.0–149.0)
VLDL: 37.6 mg/dL (ref 0.0–40.0)

## 2020-09-03 ENCOUNTER — Telehealth: Payer: Self-pay | Admitting: Pharmacist

## 2020-09-03 NOTE — Chronic Care Management (AMB) (Signed)
    Chronic Care Management Pharmacy Assistant   Name: Rebecca [name]  MRN: [id number] DOB: 1932-06-13  09/03/20- Called patient to remind of appointment with Jeni Salles) on (09/04/20 at 11am by phone)   No answer, left message of appointment date, time and type of appointment (either telephone or in person). Left message to have all medications, supplements, blood pressure and/or blood sugar logs available during appointment and to return call if need to reschedule.  Star Rating Drug:   Losartan 100mg   - last filled on 06/17/20 90DS at [facility]  Rosuvastatin 20mg  - last filled on 06/19/20 90[facility] at [facility]  Any gaps in medications fill history? No  [place]  Clinical Pharmacist Assistant [phone]

## 2020-09-04 ENCOUNTER — Ambulatory Visit (INDEPENDENT_AMBULATORY_CARE_PROVIDER_SITE_OTHER): Payer: Medicare Other | Admitting: Pharmacist

## 2020-09-04 DIAGNOSIS — I10 Essential (primary) hypertension: Secondary | ICD-10-CM | POA: Diagnosis not present

## 2020-09-04 DIAGNOSIS — E785 Hyperlipidemia, unspecified: Secondary | ICD-10-CM | POA: Diagnosis not present

## 2020-09-04 NOTE — Patient Instructions (Signed)
Hi Rebecca [name],  It was great speaking with you again! Below is a summary of some of the topics we discussed.   Please reach out to me in you don't receive the application in the next 2 weeks or if you have any questions while filling it out.  Please reach out to me if you have any questions or need anything before our follow up!  Best, Rebecca [name]  Rebecca [name], Rebecca [name], [place] [facility] [place]  Visit Information  Goals Addressed   None    Patient Care Plan: CCM Pharmacy Care Plan    Problem Identified: Problem: Hypertension, Hyperlipidemia, GERD, Hypothyroidism, Anxiety, Osteopenia and Neuropathy     Long-Range Goal: Patient-Specific Goal   Start Date: 09/04/2020  Expected End Date: 09/04/2021  This Visit's Progress: On track  Priority: High  Note:   Current Barriers:  . Unable to independently monitor therapeutic efficacy  Pharmacist Clinical Goal(s):  [name] Kitchen Patient will achieve adherence to monitoring guidelines and medication adherence to achieve therapeutic efficacy through collaboration with Rebecca [name] and provider.   Interventions: . 1:1 collaboration with Rebecca Post, MD regarding development and update of comprehensive plan of care as evidenced by provider attestation and co-signature . Inter-disciplinary care team collaboration (see longitudinal plan of care) . Comprehensive medication review performed; medication list updated in electronic medical record  Hypertension (BP goal <140/90) -Controlled -Current treatment: . Losartan 100 mg 1 tablet daily -Medications previously tried: none  -Current home readings: not checking -Current dietary habits: limits salt intake -Current exercise habits: limited -Denies hypotensive/hypertensive symptoms -Educated on BP goals and benefits of medications for prevention of heart attack, stroke and kidney damage; Importance of home blood pressure monitoring; Proper BP monitoring  technique; -Counseled to monitor BP at home weekly, document, and provide log at future appointments -Counseled on diet and exercise extensively Recommended to continue current medication  Hyperlipidemia: (LDL goal < 70) -Controlled -Current treatment: . Rosuvastatin 1 tablet daily -Medications previously tried: simvastatin (myalgias) -Current dietary patterns: did not discuss -Current exercise habits: limited -Educated on Cholesterol goals;  Benefits of statin for ASCVD risk reduction; Importance of limiting foods high in cholesterol; Exercise goal of 150 minutes per week; -Counseled on diet and exercise extensively Recommended to continue current medication  Anxiety (Goal: minimize symptoms) -Controlled -Current treatment: . Alprazolam 0.25 mg 1 tablet at bedtime as needed -Medications previously tried/failed: none -GAD7: n/a -Educated on Benefits of medication for symptom control -Recommended to continue current medication Counseled on continued use of alprazolam sparingly due to long term risks  Osteopenia (Goal prevent fractures) -Controlled -Last DEXA Scan: not able to access records from OBGYN   T-Score femoral neck: n/a  T-Score total hip: n/a  T-Score lumbar spine: n/a  T-Score forearm radius: n/a  10-year probability of major osteoporotic fracture: n/a  10-year probability of hip fracture: n/a -Patient is not a candidate for pharmacologic treatment -Current treatment   Vitamin D 2000 units 1 tablet daily  Calcium carbonate-vit D 600-200 mg-unit 1 tablet 2 times daily -Medications previously tried: Reclast (finished 5 years of treatment) -Recommend [phone] units of vitamin D daily. Recommend 1200 mg of calcium daily from dietary and supplemental sources. Recommend weight-bearing and muscle strengthening exercises for building and maintaining bone density. -Counseled on diet and exercise extensively  Neuropathy (Goal: minimize symptoms) -Controlled -Current  treatment  . Gabapentin 100 mg 2 capsules at bedtime (taking 1 at bedtime) -Medications previously tried: none  -Recommended to continue current medication  GERD (Goal: minimize  symptoms) -Controlled -Current treatment   Esomeprazole 20 mg 1 capsule twice daily  Metoclopramide 5 mg 1 tablet daily (written as twice daily) -Medications previously tried: none  -Counseled on diet and exercise extensively Recommended to continue current medication  Eye medications (Goal: minimize symptoms) -Controlled -Current treatment  . Vyzulta 0.024% eye drops daily -Medications previously tried: Timolol  -Assessed patient finances. Patient may qualify for PAP program. Plan to mail application.   Health Maintenance -Vaccine gaps: tetanus (too expensive), Prevnar -Current therapy:   Vitamin B 12 500 mcg 1 tablets daily  Diclofenac gel 1% as needed  Vitamin C 500 mg 1 tablet daily -Educated on Cost vs benefit of each product must be carefully weighed by individual consumer -Patient is satisfied with current therapy and denies issues -Recommended to continue current medication  Patient Goals/Self-Care Activities . Patient will:  - take medications as prescribed check blood pressure weekly, document, and provide at future appointments target a minimum of 150 minutes of moderate intensity exercise weekly  Follow Up Plan: Telephone follow up appointment with care management team member scheduled for: 6 months       Patient verbalizes understanding of instructions provided today and agrees to view in [name].  Telephone follow up appointment with pharmacy team member scheduled for: 6 months  Rebecca [name], [facility]

## 2020-09-04 NOTE — Progress Notes (Signed)
Chronic Care Management Pharmacy Note  09/04/2020 Name:  Rebecca [name] MRN:  [id number] DOB:  11/01/1932  Summary: BP at goal < 140/90 per office readings LDL at goal < 70  Recommendations/Changes made from today's visit: -Recommended for patient to check BP regularly at home -Mailed patient assistance application for Vyzulta  Plan: -Follow up in 6 months  Subjective: Rebecca [name] is an 87 y.o. year old female who is a primary patient of Burchette, Alinda Sierras, MD.  The CCM team was consulted for assistance with disease management and care coordination needs.    Engaged with patient by telephone for follow up visit in response to provider referral for pharmacy case management and/or care coordination services.   Consent to Services:  The patient was given information about Chronic Care Management services, agreed to services, and gave verbal consent prior to initiation of services.  Please see initial visit note for detailed documentation.   Patient Care Team: [name], MD as PCP - General (Family Medicine) Ladene Artist, MD as Consulting Physician (Gastroenterology) Aloha Gell, MD as Consulting Physician (Obstetrics and Gynecology) Luberta Mutter, MD as Consulting Physician (Ophthalmology) [name], [facility] as Pharmacist (Pharmacist)  Recent office visits: 06/19/20 Carolann Littler, MD: Patient presented for chronic conditions follow up. Patient feels as though her legs are fatigued with dose increase of simvastatin. D/c'd simvastatin and prescribed rosuvastatin.   03/26/20 CPP telephone encounter: Simvastatin increased to 40 mg daily.   03/20/21 Carolann Littler, MD: Patient presented for annual exam.   Recent consult visits: 06/22/20 Lyman Bishop, MD (cardiology): Patient presented for carotid arterial duplex study.  05/01/20 Luberta Mutter (ophthalmology): Unable to access notes.  04/30/20 Joni Fears, MD (orthopedic surgery): Patient  presented for left hip pain follow up. Reports PT helped. Follow up PRN.  04/29/20 Amador Cunas, PT (rehab): Patient presented for PT session.  Hospital visits: None in previous 6 months   Objective:  Lab Results  Component Value Date   CREATININE 0.90 (H) 03/20/2020   BUN 12 03/20/2020   GFR 59.65 (L) 02/15/2019   GFRNONAA 52 (L) 02/23/2016   GFRAA >60 02/23/2016   NA 133 (L) 03/20/2020   K 4.6 03/20/2020   CALCIUM 10.1 03/20/2020   CO2 27 03/20/2020   GLUCOSE 101 (H) 03/20/2020    Lab Results  Component Value Date/Time   HGBA1C 5.6 07/26/2006 08:23 AM   GFR 59.65 (L) 02/15/2019 10:44 AM   GFR 66.98 02/13/2018 10:37 AM    Last diabetic Eye exam: No results found for: HMDIABEYEEXA  Last diabetic Foot exam: No results found for: HMDIABFOOTEX   Lab Results  Component Value Date   CHOL 132 08/21/2020   HDL 39.20 08/21/2020   LDLCALC 55 08/21/2020   LDLDIRECT 89.0 05/13/2020   TRIG 188.0 (H) 08/21/2020   CHOLHDL 3 08/21/2020    Hepatic Function Latest Ref Rng & Units 08/21/2020 03/20/2020 02/15/2019  Total Protein 6.0 - 8.3 g/dL 6.2 6.3 6.0  Albumin 3.5 - 5.2 g/dL 4.1 - 4.1  AST 0 - 37 U/L _0 ALT 0 - 35 U/L _1 Alk Phosphatase 39 - 117 U/L 71 - 59  Total Bilirubin 0.2 - 1.2 mg/dL 0.6 0.6 0.8  Bilirubin, Direct 0.0 - 0.3 mg/dL 0.1 0.1 0.1    Lab Results  Component Value Date/Time   TSH 0.73 03/20/2020 11:21 AM   TSH 2.24 08/16/2019 11:07 AM   FREET4 0.92 04/28/2011 12:44 PM  CBC Latest Ref Rng & Units 05/01/2018 02/13/2018 07/04/2017  WBC 4.0 - 10.5 K/uL 11.5(H) 16.0(H) 10.2  Hemoglobin 12.0 - 15.0 g/dL 12.6 13.0 12.8  Hematocrit 36.0 - 46.0 % 37.7 38.6 37.7  Platelets 150.0 - 400.0 K/uL 226.0 243.0 189.0    Lab Results  Component Value Date/Time   VD25OH 45 03/20/2020 11:21 AM   VD25OH 47.49 01/01/2016 09:55 AM   VD25OH 41.39 12/25/2013 09:39 AM    Clinical ASCVD: Yes  The ASCVD Risk score Mikey Bussing DC Jr., et al., 2013) failed to calculate  for the following reasons:   The 2013 ASCVD risk score is only valid for ages 12 to 22    Depression screen PHQ 2/9 03/20/2020 11/19/2019 02/15/2019  Decreased Interest 0 0 0  Down, Depressed, Hopeless 0 0 0  PHQ - 2 Score 0 0 0  Altered sleeping - 0 -  Tired, decreased energy - 0 -  Change in appetite - 0 -  Feeling bad or failure about yourself  - 0 -  Trouble concentrating - 0 -  Moving slowly or fidgety/restless - 0 -  Suicidal thoughts - 0 -  PHQ-9 Score - 0 -  Difficult doing work/chores - Not difficult at all -  Some recent data might be hidden     Social History   Tobacco Use  Smoking Status Never Smoker  Smokeless Tobacco Never Used   BP Readings from Last 3 Encounters:  06/19/20 120/74  03/20/20 120/60  08/30/19 140/80   Pulse Readings from Last 3 Encounters:  06/19/20 73  03/20/20 (!) 45  08/30/19 93   Wt Readings from Last 3 Encounters:  06/19/20 123 lb 4 oz (55.9 kg)  03/20/20 123 lb 14.4 oz (56.2 kg)  03/17/20 122 lb (55.3 kg)   BMI Readings from Last 3 Encounters:  06/19/20 22.54 kg/m  03/20/20 22.66 kg/m  03/17/20 22.31 kg/m    Assessment/Interventions: Review of patient past medical history, allergies, medications, health status, including review of consultants reports, laboratory and other test data, was performed as part of comprehensive evaluation and provision of chronic care management services.   SDOH:  (Social Determinants of Health) assessments and interventions performed: No  SDOH Screenings   Alcohol Screen: Low Risk   . Last Alcohol Screening Score (AUDIT): 0  Depression (PHQ2-9): Low Risk   . PHQ-2 Score: 0  Financial Resource Strain: Low Risk   . Difficulty of Paying Living Expenses: Not hard at all  Food Insecurity: No Food Insecurity  . Worried About Charity fundraiser in the Last Year: Never true  . Ran Out of Food in the Last Year: Never true  Housing: Low Risk   . Last Housing Risk Score: 0  Physical Activity:  Insufficiently Active  . Days of Exercise per Week: 7 days  . Minutes of Exercise per Session: 20 min  Social Connections: Moderately Isolated  . Frequency of Communication with Friends and Family: More than three times a week  . Frequency of Social Gatherings with Friends and Family: More than three times a week  . Attends Religious Services: More than 4 times per year  . Active Member of Clubs or Organizations: No  . Attends Archivist Meetings: Never  . Marital Status: Widowed  Stress: No Stress Concern Present  . Feeling of Stress : Not at all  Tobacco Use: Low Risk   . Smoking Tobacco Use: Never Smoker  . Smokeless Tobacco Use: Never Used  Transportation Needs: No Transportation  Needs  . Lack of Transportation (Medical): No  . Lack of Transportation (Non-Medical): No    CCM Care Plan  Allergies  Allergen Reactions  . Levaquin [Levofloxacin] Other (See Comments)    Very nauseous and shakey  . Sulfamethoxazole-Trimethoprim Nausea Only    shakey  . Lactose Intolerance (Gi) Diarrhea and Other (See Comments)    Stomach hurts   . Tramadol Hcl Nausea Only and Palpitations  . Tramadol Hcl Nausea Only and Palpitations  . Augmentin [Amoxicillin-Pot Clavulanate] Diarrhea  . Ciprofloxacin Nausea Only    REACTION: Thrush, Nausea, Shakey  . Pneumococcal Vaccine Polyvalent     REACTION: RED AND RAISED RASH ON ARM  . Adhesive [Tape] Rash  . Codeine Nausea Only    Medications Reviewed Today    Reviewed by [name], MD (Physician) on 06/19/20 at 1350  Med List Status: <None>  Medication Order Taking? Sig Documenting Provider Last Dose Status Informant  ALPRAZolam (XANAX) 0.25 MG tablet [id number] Yes TAKE 1 TABLET(0.25 MG) BY MOUTH AT BEDTIME AS NEEDED Burchette, Alinda Sierras, MD Taking Active   Calcium Carb-Cholecalciferol (CALCIUM-VITAMIN D) 600-400 MG-UNIT TABS [id number] Yes Take 1 tablet by mouth daily. [provider] Taking Active Self  Cholecalciferol  (VITAMIN D3) 2000 UNITS TABS [id number] Yes Take 1 tablet by mouth daily. [provider] Taking Active Self  diclofenac sodium (VOLTAREN) 1 % GEL [id number] Yes Apply 2 g topically 4 (four) times daily. [name], MD Taking Active   esomeprazole (NEXIUM) 20 MG capsule [id number] Yes Take 20 mg by mouth 2 (two) times daily before a meal. [provider] Taking Active Self  gabapentin (NEURONTIN) 100 MG capsule [id number] Yes TAKE 2 CAPSULES(200 MG) BY MOUTH AT BEDTIME Burchette, Alinda Sierras, MD Taking Active   Latanoprostene Bunod (VYZULTA) 0.024 % SOLN [id number] Yes Apply to eye. [provider] Taking Active   levothyroxine (SYNTHROID) 88 MCG tablet [id number] Yes TAKE 1 TABLET(88 MCG) BY MOUTH DAILY Burchette, Alinda Sierras, MD Taking Active   losartan (COZAAR) 100 MG tablet [id number] Yes TAKE 1 TABLET BY MOUTH EVERY DAY Burchette, Alinda Sierras, MD Taking Active   metoCLOPramide (REGLAN) 5 MG tablet [id number] Yes TAKE 1 TABLET BY MOUTH TWICE DAILY WITH MEALS Burchette, Alinda Sierras, MD Taking Active         Discontinued 07/28/11 1010 (Reorder)   simvastatin (ZOCOR) 40 MG tablet [id number] Yes Take 1 tablet (40 mg total) by mouth at bedtime. [name], MD Taking Active   vitamin B-12 (CYANOCOBALAMIN) 500 MCG tablet [id number] Yes Take 1,000 mcg by mouth every morning. [provider] Taking Active Self  vitamin C (ASCORBIC ACID) 500 MG tablet [id number] Yes Take 500 mg by mouth daily. [provider] Taking Active Self          Patient Active Problem List   Diagnosis Date Noted  . Pain in left hip 12/17/2019  . Pain in left leg 11/06/2019  . Bilateral primary osteoarthritis of knee 06/11/2019  . Pain in left shoulder 05/22/2019  . Cellulitis of left upper extremity 10/15/2016  . Segmental colitis without complication ([place]) 82/99/3716  . Diverticulosis of colon without hemorrhage 02/04/2015  . Unspecified vitamin D deficiency 12/25/2013  . UTI (urinary  tract infection) 09/29/2011  . 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 Atherosclerosis 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 04/20/2011  . SPINAL STENOSIS, LUMBAR 03/09/2010  . OTHER OSTEOPOROSIS 03/09/2010  . SCOLIOSIS, LUMBAR SPINE 03/09/2010  . MIGRAINE, OPHTHALMIC 10/29/2009  . LUMBAR RADICULOPATHY,  ATYPICAL 10/29/2009  . POLYNEUROPATHY OTHER DISEASES CLASSIFIED ELSW 09/22/2009  . UNSPECIFIED PERIPHERAL VASCULAR DISEASE 09/22/2009  . ADENOMATOUS COLONIC POLYP 06/10/2009  . History of iron deficiency 05/06/2009  . CAROTID ARTERY DISEASE 07/28/2008  . Allergic rhinitis 03/20/2008  . DYSPHAGIA 03/12/2008  . GANGLION OF TENDON SHEATH 08/17/2007  . Hypothyroidism 11/15/2006  . Hyperlipidemia 11/07/2006  . ADVEF, DRUG/MEDICINAL/BIOLOGICAL SUBST NOS 11/07/2006  . Essential hypertension 10/18/2006  . GERD 10/18/2006  . DIVERTICULOSIS, COLON 10/18/2006    Immunization History  Administered Date(s) Administered  . Fluad Quad(high Dose 65+) 12/31/2018  . H1N1 06/02/2008  . Influenza Split 12/29/2010  . Influenza Whole 04/04/2001, 02/01/2007, 12/24/2007, 12/16/2008, 01/15/2009, 01/05/2010  . Influenza, High Dose Seasonal PF 12/31/2014, 01/01/2016, 01/11/2017, 01/02/2018  . Influenza,inj,Quad PF,6+ Mos 12/19/2012, 12/25/2013  . Influenza-Unspecified 02/10/2020  . PFIZER(Purple Top)SARS-COV-2 Vaccination 05/06/2019, 05/27/2019, 02/10/2020  . Pneumococcal Polysaccharide-23 03/05/1999  . Td 04/05/1999, 07/28/2008  . Zoster Recombinat (Shingrix) 08/04/2016, 10/12/2016  . Zoster, Live 10/24/2007    Conditions to be addressed/monitored:  Hypertension, Hyperlipidemia, GERD, Hypothyroidism, Anxiety, Osteopenia and Neuropathy  Care Plan : CCM Pharmacy Care Plan  Updates made by [name], [place] since 09/04/2020 12:00 AM    Problem: Problem: Hypertension, Hyperlipidemia, GERD, Hypothyroidism, Anxiety, Osteopenia and Neuropathy     Long-Range Goal:  Patient-Specific Goal   Start Date: 09/04/2020  Expected End Date: 09/04/2021  This Visit's Progress: On track  Priority: High  Note:   Current Barriers:  . Unable to independently monitor therapeutic efficacy  Pharmacist Clinical Goal(s):  [name] Kitchen Patient will achieve adherence to monitoring guidelines and medication adherence to achieve therapeutic efficacy through collaboration with PharmD and provider.   Interventions: . 1:1 collaboration with [name], MD regarding development and update of comprehensive plan of care as evidenced by provider attestation and co-signature . Inter-disciplinary care team collaboration (see longitudinal plan of care) . Comprehensive medication review performed; medication list updated in electronic medical record  Hypertension (BP goal <140/90) -Controlled -Current treatment: . Losartan 100 mg 1 tablet daily -Medications previously tried: none  -Current home readings: not checking -Current dietary habits: limits salt intake -Current exercise habits: limited -Denies hypotensive/hypertensive symptoms -Educated on BP goals and benefits of medications for prevention of heart attack, stroke and kidney damage; Importance of home blood pressure monitoring; Proper BP monitoring technique; -Counseled to monitor BP at home weekly, document, and provide log at future appointments -Counseled on diet and exercise extensively Recommended to continue current medication  Hyperlipidemia: (LDL goal < 70) -Controlled -Current treatment: . Rosuvastatin 1 tablet daily -Medications previously tried: simvastatin (myalgias) -Current dietary patterns: did not discuss -Current exercise habits: limited -Educated on Cholesterol goals;  Benefits of statin for ASCVD risk reduction; Importance of limiting foods high in cholesterol; Exercise goal of 150 minutes per week; -Counseled on diet and exercise extensively Recommended to continue current medication  Anxiety  (Goal: minimize symptoms) -Controlled -Current treatment: . Alprazolam 0.25 mg 1 tablet at bedtime as needed -Medications previously tried/failed: none -GAD7: n/a -Educated on Benefits of medication for symptom control -Recommended to continue current medication Counseled on continued use of alprazolam sparingly due to long term risks  Osteopenia (Goal prevent fractures) -Controlled -Last DEXA Scan: not able to access records from OBGYN   T-Score femoral neck: n/a  T-Score total hip: n/a  T-Score lumbar spine: n/a  T-Score forearm radius: n/a  10-year probability of major osteoporotic fracture: n/a  10-year probability of hip fracture: n/a -Patient is not a candidate for pharmacologic treatment -  Current treatment   Vitamin D 2000 units 1 tablet daily  Calcium carbonate-vit D 600-200 mg-unit 1 tablet 2 times daily -Medications previously tried: Reclast (finished 5 years of treatment) -Recommend [phone] units of vitamin D daily. Recommend 1200 mg of calcium daily from dietary and supplemental sources. Recommend weight-bearing and muscle strengthening exercises for building and maintaining bone density. -Counseled on diet and exercise extensively  Neuropathy (Goal: minimize symptoms) -Controlled -Current treatment  . Gabapentin 100 mg 2 capsules at bedtime (taking 1 at bedtime) -Medications previously tried: none  -Recommended to continue current medication  GERD (Goal: minimize symptoms) -Controlled -Current treatment   Esomeprazole 20 mg 1 capsule twice daily  Metoclopramide 5 mg 1 tablet daily (written as twice daily) -Medications previously tried: none  -Counseled on diet and exercise extensively Recommended to continue current medication  Eye medications (Goal: minimize symptoms) -Controlled -Current treatment  . Vyzulta 0.024% eye drops daily -Medications previously tried: Timolol  -Assessed patient finances. Patient may qualify for PAP program. Plan to mail  application.   Health Maintenance -Vaccine gaps: tetanus (too expensive), Prevnar -Current therapy:   Vitamin B 12 500 mcg 1 tablets daily  Diclofenac gel 1% as needed  Vitamin C 500 mg 1 tablet daily -Educated on Cost vs benefit of each product must be carefully weighed by individual consumer -Patient is satisfied with current therapy and denies issues -Recommended to continue current medication  Patient Goals/Self-Care Activities . Patient will:  - take medications as prescribed check blood pressure weekly, document, and provide at future appointments target a minimum of 150 minutes of moderate intensity exercise weekly  Follow Up Plan: Telephone follow up appointment with care management team member scheduled for: 6 months       Medication Assistance: Application for Vyzulta  medication assistance program. in process.  Anticipated assistance start date unknown.  See plan of care for additional detail.  Compliance/Adherence/Medication fill history: Care Gaps: Prevnar  Star-Rating Drugs: Losartan 100 mg - last filled 06/17/20 for 90 ds Rosuvastatin 20 mg - last filled 06/19/20 for 90 ds  Patient's preferred pharmacy is:  [facility] [place], [place] - [address] AT [address] [place] [address] Phone: [phone] Fax: [id number] [place] [address] Phone: [phone] Fax: [phone]  Uses pill box? No - in medicine cabinet Pt endorses 100% compliance  We discussed: Current pharmacy is preferred with insurance plan and patient is satisfied with pharmacy services Patient decided to: Continue current medication management strategy  Care Plan and Follow Up Patient Decision:  Patient agrees to Care Plan and Follow-up.  Plan: Telephone follow up appointment with care management team member  scheduled for:  6 months  Jeni Salles, PharmD [place] Pharmacist [place] [facility] [place] [phone]

## 2020-09-15 ENCOUNTER — Telehealth: Payer: Self-pay | Admitting: Pharmacist

## 2020-09-15 DIAGNOSIS — Z4689 Encounter for fitting and adjustment of other specified devices: Secondary | ICD-10-CM | POA: Diagnosis not present

## 2020-09-15 DIAGNOSIS — Z01411 Encounter for gynecological examination (general) (routine) with abnormal findings: Secondary | ICD-10-CM | POA: Diagnosis not present

## 2020-09-15 DIAGNOSIS — Z6824 Body mass index (BMI) 24.0-24.9, adult: Secondary | ICD-10-CM | POA: Diagnosis not present

## 2020-09-15 DIAGNOSIS — N8110 Cystocele, unspecified: Secondary | ICD-10-CM | POA: Diagnosis not present

## 2020-09-15 DIAGNOSIS — L900 Lichen sclerosus et atrophicus: Secondary | ICD-10-CM | POA: Diagnosis not present

## 2020-09-15 DIAGNOSIS — Z124 Encounter for screening for malignant neoplasm of cervix: Secondary | ICD-10-CM | POA: Diagnosis not present

## 2020-09-15 DIAGNOSIS — Z01419 Encounter for gynecological examination (general) (routine) without abnormal findings: Secondary | ICD-10-CM | POA: Diagnosis not present

## 2020-09-15 DIAGNOSIS — N952 Postmenopausal atrophic vaginitis: Secondary | ICD-10-CM | POA: Diagnosis not present

## 2020-09-15 NOTE — Chronic Care Management (AMB) (Signed)
    Chronic Care Management Pharmacy Assistant   Name: Rebecca [name]  MRN: [id number] DOB: [birth year]  Completed application Per Jeni Salles for Vyzulta eye drops through needymeds. Do not need to call patient and inform per Jeni Salles. Patient is aware application will be mailed and she will need to supply to her eye doctor to finish completing. Application sent to Pattricia Boss PTM to print and mail to patient.   [place]  Clinical Pharmacist Assistant [phone]

## 2020-09-16 ENCOUNTER — Other Ambulatory Visit: Payer: Self-pay

## 2020-09-16 ENCOUNTER — Telehealth: Payer: Self-pay | Admitting: Family Medicine

## 2020-09-16 DIAGNOSIS — H9190 Unspecified hearing loss, unspecified ear: Secondary | ICD-10-CM

## 2020-09-16 NOTE — Telephone Encounter (Signed)
Referral to audiology has placed.  Left message for patient to call back.

## 2020-09-16 NOTE — Telephone Encounter (Signed)
Order was faxed over by [facility].

## 2020-09-16 NOTE — Telephone Encounter (Signed)
Patient called in today because she wears hearing aids and needs permission to have hearing test by Dr. Elease Hashimoto. Patient says that her audiologist states they have not received anything and she is scheduled to have hearing test in 4 weeks.  Patient would like a call back at [phone] to advise of next steps.  Please advise.

## 2020-09-17 NOTE — Telephone Encounter (Signed)
ATC, unable to leave a voice mail.  

## 2020-09-18 NOTE — Telephone Encounter (Signed)
Spoke with the patient. She is aware the order was faxed over. Nothing further needed.

## 2020-09-22 DIAGNOSIS — H401132 Primary open-angle glaucoma, bilateral, moderate stage: Secondary | ICD-10-CM | POA: Diagnosis not present

## 2020-09-22 DIAGNOSIS — H524 Presbyopia: Secondary | ICD-10-CM | POA: Diagnosis not present

## 2020-09-22 NOTE — Telephone Encounter (Signed)
Spoke with the patient. She ask that we fax this to [phone]. And to print of a copy for her to take to [facility].  Order has been faxed and a copy has been placed up front for pick up.

## 2020-09-22 NOTE — Telephone Encounter (Signed)
The patient is calling wanting to speak with Dr. Elease Hashimoto or his nurse. She has sent multiple messages about this order and the hearing place still hasn't received the fax that was sent over on 06/15 and she needs this order completed and she don't understand what the problem is.  Please advise

## 2020-09-29 ENCOUNTER — Ambulatory Visit: Payer: Medicare Other | Admitting: Family Medicine

## 2020-10-07 ENCOUNTER — Other Ambulatory Visit: Payer: Self-pay

## 2020-10-07 DIAGNOSIS — H9190 Unspecified hearing loss, unspecified ear: Secondary | ICD-10-CM

## 2020-10-12 ENCOUNTER — Other Ambulatory Visit: Payer: Self-pay | Admitting: Family Medicine

## 2020-10-12 NOTE — Telephone Encounter (Signed)
Last refill- 07/20/20--20 tabs, no refills Last office visit-06/19/20  No future visit scheduled

## 2020-10-20 DIAGNOSIS — H903 Sensorineural hearing loss, bilateral: Secondary | ICD-10-CM | POA: Diagnosis not present

## 2020-11-03 ENCOUNTER — Other Ambulatory Visit: Payer: Self-pay

## 2020-11-03 ENCOUNTER — Ambulatory Visit (INDEPENDENT_AMBULATORY_CARE_PROVIDER_SITE_OTHER): Payer: Medicare Other | Admitting: Orthopaedic Surgery

## 2020-11-03 ENCOUNTER — Encounter: Payer: Self-pay | Admitting: Orthopaedic Surgery

## 2020-11-03 ENCOUNTER — Telehealth: Payer: Self-pay | Admitting: Orthopaedic Surgery

## 2020-11-03 VITALS — Ht 62.0 in | Wt 123.0 lb

## 2020-11-03 DIAGNOSIS — M79652 Pain in left thigh: Secondary | ICD-10-CM

## 2020-11-03 DIAGNOSIS — M25552 Pain in left hip: Secondary | ICD-10-CM

## 2020-11-03 MED ORDER — METHYLPREDNISOLONE ACETATE 40 MG/ML IJ SUSP
80.0000 mg | INTRAMUSCULAR | Status: AC | PRN
  Administered 2020-11-03: 80 mg via INTRA_ARTICULAR

## 2020-11-03 MED ORDER — BUPIVACAINE HCL 0.25 % IJ SOLN
2.0000 mL | INTRAMUSCULAR | Status: AC | PRN
  Administered 2020-11-03: 2 mL via INTRA_ARTICULAR

## 2020-11-03 MED ORDER — LIDOCAINE HCL 1 % IJ SOLN
2.0000 mL | INTRAMUSCULAR | Status: AC | PRN
  Administered 2020-11-03: 2 mL

## 2020-11-03 NOTE — Telephone Encounter (Signed)
Pt wanted to know if she is a candidate for gel injs. Pt just had an appt with Durward Fortes and forgot to ask him during her appt. The best call back number is [phone].

## 2020-11-03 NOTE — Telephone Encounter (Signed)
Yes, for her knee OA

## 2020-11-03 NOTE — Progress Notes (Signed)
Office Visit Note   Patient: Rebecca [name]           Date of Birth: January 04, 1933           MRN: [id number] Visit Date: 11/03/2020              Requested by: [name], MD [address],  [place] [address] PCP: [name], MD   Assessment & Plan: Visit Diagnoses: No diagnosis found.  Plan: Mrs. Weiman has had some recurrence of her left hip pain.  For several months we were evaluating this at the end of last year.  She had evidence of a possible nondisplaced greater trochanter fracture by CT scan.  She has had prior MRI scans of her back demonstrating significant degenerative changes and she had some mild arthritis of her hip.  On this occasion she has had recurrent pain along the lateral aspect of the left hip without injury or trauma.  She is tender directly over the greater trochanter.  I am going to inject that area with the cortisone and just monitor her response.  If no improvement over the next several weeks we will repeat films of her pelvis.  She does not think this is referred from her back  Follow-Up Instructions: Return if symptoms worsen or fail to improve.   Orders:  No orders of the defined types were placed in this encounter.  No orders of the defined types were placed in this encounter.     Procedures: Large Joint Inj: L greater trochanter on 11/03/2020 3:34 PM Indications: pain and diagnostic evaluation Details: 25 G 1.5 in needle, lateral approach  Arthrogram: No  Medications: 2 mL lidocaine 1 %; 80 mg methylPREDNISolone acetate 40 MG/ML; 2 mL bupivacaine 0.25 % Procedure, treatment alternatives, risks and benefits explained, specific risks discussed. Consent was given by the patient. Immediately prior to procedure a time out was called to verify the correct patient, procedure, equipment, support staff and site/side marked as required. Patient was prepped and draped in the usual sterile fashion.      Clinical Data: No  additional findings.   Subjective: Chief Complaint  Patient presents with   Left Hip - Pain  Patient presents today for recurrent left hip pain. Patient states that it started to hurt again a few weeks ago. She feels like it is grinding. Her pain is located laterally. No new injury or fall. No groin pain.  Pain is not constant and comes and goes.  There is no particular pattern to her pain and is mostly localized to the greater trochanter of her left hip.  Not having any specific back pain  HPI  Review of Systems   Objective: Vital Signs: Ht '5\' 2"'$  (1.575 m)   Wt 123 lb (55.8 kg)   BMI 22.50 kg/m   Physical Exam Constitutional:      Appearance: She is well-developed.  Pulmonary:     Effort: Pulmonary effort is normal.  Skin:    General: Skin is warm and dry.  Neurological:     Mental Status: She is alert and oriented to person, place, and time.  Psychiatric:        Behavior: Behavior normal.    Ortho Exam comfortable sitting but does have tenderness directly over the greater trochanter of her left hip.  No pain with internal and external rotation great leg raise negative.  No percussible tenderness of the lumbar spine.  No calf pain.  No knee pain.  Motor exam intact.  Has been using a cane just for caution but walks without a limp.  Specialty Comments:  No specialty comments available.  Imaging: No results found.   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 ([place]) 123XX123   Diverticulosis of colon without hemorrhage 02/04/2015   Unspecified vitamin D deficiency 12/25/2013   UTI (urinary tract infection) 09/29/2011   Sclerosing panniculitis 07/28/2011   Atherosclerosis 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  04/20/2011   SPINAL STENOSIS, LUMBAR 03/09/2010   OTHER OSTEOPOROSIS 03/09/2010   SCOLIOSIS, LUMBAR SPINE 03/09/2010   MIGRAINE, OPHTHALMIC 10/29/2009   LUMBAR RADICULOPATHY, ATYPICAL 10/29/2009   POLYNEUROPATHY OTHER DISEASES CLASSIFIED ELSW 09/22/2009   UNSPECIFIED PERIPHERAL VASCULAR DISEASE 09/22/2009   ADENOMATOUS COLONIC POLYP 06/10/2009   History of iron deficiency 05/06/2009   CAROTID ARTERY DISEASE 07/28/2008   Allergic rhinitis 03/20/2008   DYSPHAGIA 03/12/2008   GANGLION OF TENDON SHEATH 08/17/2007   Hypothyroidism 11/15/2006   Hyperlipidemia 11/07/2006   ADVEF, DRUG/MEDICINAL/BIOLOGICAL SUBST NOS 11/07/2006   Essential hypertension 10/18/2006   GERD 10/18/2006   DIVERTICULOSIS, COLON 10/18/2006   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[place])    Tubular adenoma of colon 05/2009    Family History  Problem Relation Age of Onset   Diabetes Mother    Heart disease Mother    Hyperlipidemia Mother    Hypertension Mother    Anuerysm Father    Heart disease Father    Hypertension Father    Migraines Daughter    Kidney Stones Daughter    Colon polyps Sister    Lung cancer Sister    Colon polyps Brother    Diabetes Brother    Hyperlipidemia Brother    Hypertension Brother    Colon polyps Daughter    Breast cancer Daughter    Colon cancer Maternal Aunt        Rectal cancer   Cancer Daughter        salivary   Colon cancer Paternal Grandmother        with possible stomach cancer   Heart disease Brother    Crohn's disease Neg Hx    Pancreatic cancer Neg Hx     Past Surgical History:  Procedure Laterality Date   APPENDECTOMY     ARCUATE KERATECTOMY     CATARACT EXTRACTION, BILATERAL  2015    double pallital tori bone removed      HERNIA REPAIR     umbilical   KNEE SURGERY Bilateral    morton's neuroma on rt foot and platar facial release     MOUTH SURGERY     "bone shaved off roof of mouth"   SALIVARY GLAND SURGERY     removal   TONSILLECTOMY     Social History   Occupational History   Occupation: Glass blower/designer    Comment: retired  Tobacco Use   Smoking status: Never  Smokeless tobacco: Never  Vaping Use   Vaping Use: Never used  Substance and Sexual Activity   Alcohol use: Yes    Alcohol/week: 0.0 standard drinks    Comment: rare   Drug use: No   Sexual activity: Yes

## 2020-11-04 ENCOUNTER — Telehealth: Payer: Self-pay

## 2020-11-04 NOTE — Telephone Encounter (Signed)
Spoke with patient and sent in for approval bilaterally.

## 2020-11-04 NOTE — Telephone Encounter (Signed)
Noted  

## 2020-11-04 NOTE — Telephone Encounter (Signed)
Please precert for bilateral visco injections. This is Dr.Whitfield's patient. Thanks!

## 2020-11-19 ENCOUNTER — Ambulatory Visit: Payer: Medicare Other

## 2020-11-27 DIAGNOSIS — N8110 Cystocele, unspecified: Secondary | ICD-10-CM | POA: Diagnosis not present

## 2020-11-27 DIAGNOSIS — Z4689 Encounter for fitting and adjustment of other specified devices: Secondary | ICD-10-CM | POA: Diagnosis not present

## 2020-11-30 ENCOUNTER — Other Ambulatory Visit: Payer: Self-pay | Admitting: Family Medicine

## 2020-11-30 ENCOUNTER — Telehealth: Payer: Self-pay | Admitting: Family Medicine

## 2020-11-30 NOTE — Telephone Encounter (Signed)
Tried calling patient to schedule Medicare Annual Wellness Visit (AWV) either virtually or in office. Left  my Herbie Drape number [phone]  No answer    Last AWV  11/19/19  please schedule at anytime with [facility] Nurse Health Advisor 1 or 2   This should be a 45 minute visit.

## 2020-11-30 NOTE — Telephone Encounter (Signed)
Last filled 10/12/2020 Last OV 09/04/2020  Ok to fill?

## 2020-12-02 ENCOUNTER — Telehealth: Payer: Self-pay | Admitting: Orthopaedic Surgery

## 2020-12-02 ENCOUNTER — Ambulatory Visit (INDEPENDENT_AMBULATORY_CARE_PROVIDER_SITE_OTHER): Payer: Medicare Other | Admitting: Orthopaedic Surgery

## 2020-12-02 ENCOUNTER — Other Ambulatory Visit: Payer: Self-pay

## 2020-12-02 ENCOUNTER — Ambulatory Visit (INDEPENDENT_AMBULATORY_CARE_PROVIDER_SITE_OTHER): Payer: Medicare Other

## 2020-12-02 ENCOUNTER — Encounter: Payer: Self-pay | Admitting: Orthopaedic Surgery

## 2020-12-02 VITALS — Ht 62.0 in | Wt 123.0 lb

## 2020-12-02 DIAGNOSIS — M25552 Pain in left hip: Secondary | ICD-10-CM

## 2020-12-02 DIAGNOSIS — M7062 Trochanteric bursitis, left hip: Secondary | ICD-10-CM

## 2020-12-02 MED ORDER — LIDOCAINE HCL 1 % IJ SOLN
2.0000 mL | INTRAMUSCULAR | Status: AC | PRN
  Administered 2020-12-02: 2 mL

## 2020-12-02 MED ORDER — BUPIVACAINE HCL 0.25 % IJ SOLN
2.0000 mL | INTRAMUSCULAR | Status: AC | PRN
  Administered 2020-12-02: 2 mL via INTRA_ARTICULAR

## 2020-12-02 MED ORDER — METHYLPREDNISOLONE ACETATE 40 MG/ML IJ SUSP
40.0000 mg | INTRAMUSCULAR | Status: AC | PRN
  Administered 2020-12-02: 40 mg via INTRA_ARTICULAR

## 2020-12-02 NOTE — Telephone Encounter (Signed)
Noted. Will call patient on Thursday, 12/03/2020.

## 2020-12-02 NOTE — Telephone Encounter (Signed)
Pt calling about status of gel injections for bilateral knees. Pt states she was told to call back and its been a month. Please call pt at [phone].

## 2020-12-02 NOTE — Progress Notes (Signed)
Office Visit Note   Patient: Rebecca [name]           Date of Birth: 04/21/1932           MRN: [id number] Visit Date: 12/02/2020              Requested by: [name], MD [place],  [place] [address] PCP: [name], MD   Assessment & Plan: Visit Diagnoses:  1. Pain in left hip   2. Greater trochanteric bursitis of left hip     Plan: Mrs. Asuncion has had recurrent problems with pain in the area of her left hip for well over a year.  At 1 point there was a suggestion she may have a nondisplaced fracture greater trochanter by CT scan.  A follow-up MRI scan some months later did not demonstrate that but there was evidence of a peroneal tendinitis, greater trochanteric bursitis and even some partial tearing of the tendon and muscle attachments to the femur.  These eventually resolved.  She is recently had recurrence of her pain without injury or trauma with pain localized directly over the greater trochanter.  Films of the left hip demonstrate ectopic calcification within the soft tissue just proximal to the tip of the greater trochanter.  I suspect this is just part of the evolution of her peritendinitis.  Will inject with cortisone  Follow-Up Instructions: Return if symptoms worsen or fail to improve.   Orders:  Orders Placed This Encounter  Procedures   XR Pelvis 1-2 Views   No orders of the defined types were placed in this encounter.     Procedures: Large Joint Inj: L greater trochanter on 12/02/2020 1:32 PM Indications: pain and diagnostic evaluation Details: 25 G 1.5 in needle, lateral approach  Arthrogram: No  Medications: 2 mL lidocaine 1 %; 40 mg methylPREDNISolone acetate 40 MG/ML; 2 mL bupivacaine 0.25 % Procedure, treatment alternatives, risks and benefits explained, specific risks discussed. Consent was given by the patient. Immediately prior to procedure a time out was called to verify the correct patient, procedure,  equipment, support staff and site/side marked as required. Patient was prepped and draped in the usual sterile fashion.      Clinical Data: No additional findings.   Subjective: Chief Complaint  Patient presents with   Left Hip - Pain  Patient presents today for follow up on her chronic left hip pain. She states that the injection she received earlier this month did help some. She is taking Motrin as needed. She walks with a cane.   HPI  Review of Systems   Objective: Vital Signs: Ht '5\' 2"'$  (1.575 m)   Wt 123 lb (55.8 kg)   BMI 22.50 kg/m   Physical Exam Constitutional:      Appearance: She is well-developed.  Pulmonary:     Effort: Pulmonary effort is normal.  Skin:    General: Skin is warm and dry.  Neurological:     Mental Status: She is alert and oriented to person, place, and time.  Psychiatric:        Behavior: Behavior normal.    Ortho Exam awake alert and oriented x3.  Comfortable sitting.  Painless range of motion of left hip with internal extra rotation but was tender directly over the tip of the greater trochanter.  No erythema or ecchymosis.  Specialty Comments:  No specialty comments available.  Imaging: XR Pelvis 1-2 Views  Result Date: 12/02/2020 AP the pelvis demonstrates ectopic calcification  just proximal to the tip of the greater trochanter.  This was not present on films 2021.  Had a previous MRI scan of the left hip demonstrating some peritendinitis.  I suspect this calcification represents evolution of that process.  No acute changes.    PMFS History: Patient Active Problem List   Diagnosis Date Noted   Greater trochanteric bursitis of left hip 12/02/2020   Pain in left leg 11/06/2019   Bilateral primary osteoarthritis of knee 06/11/2019   Pain in left shoulder 05/22/2019   Cellulitis of left upper extremity 10/15/2016   Segmental colitis without complication ([place]) 123XX123   Diverticulosis of colon without hemorrhage 02/04/2015    Unspecified vitamin D deficiency 12/25/2013   UTI (urinary tract infection) 09/29/2011   Sclerosing panniculitis 07/28/2011   Atherosclerosis 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 04/20/2011   SPINAL STENOSIS, LUMBAR 03/09/2010   OTHER OSTEOPOROSIS 03/09/2010   SCOLIOSIS, LUMBAR SPINE 03/09/2010   MIGRAINE, OPHTHALMIC 10/29/2009   LUMBAR RADICULOPATHY, ATYPICAL 10/29/2009   POLYNEUROPATHY OTHER DISEASES CLASSIFIED ELSW 09/22/2009   UNSPECIFIED PERIPHERAL VASCULAR DISEASE 09/22/2009   ADENOMATOUS COLONIC POLYP 06/10/2009   History of iron deficiency 05/06/2009   CAROTID ARTERY DISEASE 07/28/2008   Allergic rhinitis 03/20/2008   DYSPHAGIA 03/12/2008   GANGLION OF TENDON SHEATH 08/17/2007   Hypothyroidism 11/15/2006   Hyperlipidemia 11/07/2006   ADVEF, DRUG/MEDICINAL/BIOLOGICAL SUBST NOS 11/07/2006   Essential hypertension 10/18/2006   GERD 10/18/2006   DIVERTICULOSIS, COLON 10/18/2006   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[place])    Tubular adenoma of colon 05/2009    Family History  Problem Relation Age of Onset   Diabetes Mother    Heart disease Mother    Hyperlipidemia Mother    Hypertension Mother    Anuerysm Father    Heart disease Father    Hypertension Father    Migraines Daughter    Kidney Stones Daughter    Colon polyps Sister    Lung cancer Sister    Colon polyps Brother    Diabetes Brother    Hyperlipidemia Brother    Hypertension Brother    Colon polyps Daughter    Breast cancer Daughter    Colon cancer Maternal Aunt        Rectal cancer   Cancer Daughter        salivary   Colon cancer Paternal Grandmother        with possible stomach cancer   Heart  disease Brother    Crohn's disease Neg Hx    Pancreatic cancer Neg Hx     Past Surgical History:  Procedure Laterality Date   APPENDECTOMY     ARCUATE KERATECTOMY     CATARACT EXTRACTION, BILATERAL  2015   double pallital tori bone removed      HERNIA REPAIR     umbilical   KNEE SURGERY Bilateral    morton's neuroma on rt foot and platar facial release     MOUTH SURGERY     "bone shaved off roof of mouth"   SALIVARY GLAND SURGERY     removal   TONSILLECTOMY     Social  History   Occupational History   Occupation: Glass blower/designer    Comment: retired  Tobacco Use   Smoking status: Never   Smokeless tobacco: Never  Vaping Use   Vaping Use: Never used  Substance and Sexual Activity   Alcohol use: Yes    Alcohol/week: 0.0 standard drinks    Comment: rare   Drug use: No   Sexual activity: Yes

## 2020-12-03 ENCOUNTER — Telehealth: Payer: Self-pay

## 2020-12-03 NOTE — Telephone Encounter (Signed)
Talked with patient and appointments have been made for gel injection.

## 2020-12-03 NOTE — Telephone Encounter (Signed)
VOB submitted for Orthovisc, bilateral knee. Pending BV.

## 2020-12-14 ENCOUNTER — Telehealth: Payer: Self-pay

## 2020-12-14 NOTE — Telephone Encounter (Signed)
Approved for Orthovisc, bilateral knee. [place] deductible has been met Secondary insurance will pick up remaining eligible expenses at 100%. No Co-pay No PA required  Appt. 12/29/2020 with Dr. Durward Fortes.

## 2020-12-18 ENCOUNTER — Other Ambulatory Visit: Payer: Self-pay | Admitting: Family Medicine

## 2020-12-22 DIAGNOSIS — Z23 Encounter for immunization: Secondary | ICD-10-CM | POA: Diagnosis not present

## 2020-12-23 DIAGNOSIS — H401132 Primary open-angle glaucoma, bilateral, moderate stage: Secondary | ICD-10-CM | POA: Diagnosis not present

## 2020-12-25 ENCOUNTER — Other Ambulatory Visit: Payer: Self-pay | Admitting: Family Medicine

## 2020-12-26 ENCOUNTER — Other Ambulatory Visit: Payer: Self-pay | Admitting: Family Medicine

## 2020-12-29 ENCOUNTER — Encounter: Payer: Self-pay | Admitting: Orthopaedic Surgery

## 2020-12-29 ENCOUNTER — Other Ambulatory Visit: Payer: Self-pay

## 2020-12-29 ENCOUNTER — Ambulatory Visit (INDEPENDENT_AMBULATORY_CARE_PROVIDER_SITE_OTHER): Payer: Medicare Other | Admitting: Orthopaedic Surgery

## 2020-12-29 DIAGNOSIS — M170 Bilateral primary osteoarthritis of knee: Secondary | ICD-10-CM | POA: Diagnosis not present

## 2020-12-29 MED ORDER — HYALURONAN 30 MG/2ML IX SOSY
30.0000 mg | PREFILLED_SYRINGE | INTRA_ARTICULAR | Status: AC | PRN
  Administered 2020-12-29: 30 mg via INTRA_ARTICULAR

## 2020-12-29 MED ORDER — HYALURONAN 30 MG/2ML IX SOSY
30.0000 mg | PREFILLED_SYRINGE | INTRA_ARTICULAR | Status: AC | PRN
Start: 2020-12-29 — End: 2020-12-29
  Administered 2020-12-29: 30 mg via INTRA_ARTICULAR

## 2020-12-29 NOTE — Progress Notes (Signed)
Office Visit Note   Patient: Rebecca [name]           Date of Birth: 27-Jun-1932           MRN: [id number] Visit Date: 12/29/2020              Requested by: [name], MD [place],  [place] [address] PCP: [name], MD   Assessment & Plan: Visit Diagnoses:  1. Bilateral primary osteoarthritis of knee     Plan: First Orthovisc injections both knees for osteoarthritis.  Return weekly for the next 2 weeks to complete the series  Follow-Up Instructions: Return in about 1 week (around 01/05/2021).   Orders:  No orders of the defined types were placed in this encounter.  No orders of the defined types were placed in this encounter.     Procedures: Large Joint Inj: bilateral knee on 12/29/2020 1:28 PM Indications: pain and joint swelling Details: 25 G 1.5 in needle, anteromedial approach  Arthrogram: No  Medications (Right): 30 mg Hyaluronan 30 MG/2ML Medications (Left): 30 mg Hyaluronan 30 MG/2ML Outcome: tolerated well, no immediate complications Procedure, treatment alternatives, risks and benefits explained, specific risks discussed. Consent was given by the patient. Immediately prior to procedure a time out was called to verify the correct patient, procedure, equipment, support staff and site/side marked as required. Patient was prepped and draped in the usual sterile fashion.      Clinical Data: No additional findings.   Subjective: Chief Complaint  Patient presents with   Right Knee - Pain    Orthovisc started bilaterally on 12/29/2020   Left Knee - Pain  Patient presents today for the first Orthovisc injections bilaterally.   HPI  Review of Systems   Objective: Vital Signs: There were no vitals taken for this visit.  Physical Exam  Ortho Exam knees were not hot warm or red.  There was mild tenderness on the medial compartment of both knees.  No effusion.  Full extension flexed over 100 degrees without  instability  Specialty Comments:  No specialty comments available.  Imaging: No results found.   PMFS History: Patient Active Problem List   Diagnosis Date Noted   Greater trochanteric bursitis of left hip 12/02/2020   Pain in left leg 11/06/2019   Bilateral primary osteoarthritis of knee 06/11/2019   Pain in left shoulder 05/22/2019   Cellulitis of left upper extremity 10/15/2016   Segmental colitis without complication ([place]) 73/53/2992   Diverticulosis of colon without hemorrhage 02/04/2015   Unspecified vitamin D deficiency 12/25/2013   UTI (urinary tract infection) 09/29/2011   Sclerosing panniculitis 07/28/2011   Atherosclerosis 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 04/20/2011   SPINAL STENOSIS, LUMBAR 03/09/2010   OTHER OSTEOPOROSIS 03/09/2010   SCOLIOSIS, LUMBAR SPINE 03/09/2010   MIGRAINE, OPHTHALMIC 10/29/2009   LUMBAR RADICULOPATHY, ATYPICAL 10/29/2009   POLYNEUROPATHY OTHER DISEASES CLASSIFIED ELSW 09/22/2009   UNSPECIFIED PERIPHERAL VASCULAR DISEASE 09/22/2009   ADENOMATOUS COLONIC POLYP 06/10/2009   History of iron deficiency 05/06/2009   CAROTID ARTERY DISEASE 07/28/2008   Allergic rhinitis 03/20/2008   DYSPHAGIA 03/12/2008   GANGLION OF TENDON SHEATH 08/17/2007   Hypothyroidism 11/15/2006   Hyperlipidemia 11/07/2006   ADVEF, DRUG/MEDICINAL/BIOLOGICAL SUBST NOS 11/07/2006   Essential hypertension 10/18/2006   GERD 10/18/2006   DIVERTICULOSIS, COLON 10/18/2006   Past Medical History:  Diagnosis Date   Anemia    Anxiety    Diverticulitis    Diverticulosis    Gastroparesis  GERD (gastroesophageal reflux disease)    Glaucoma    Hemorrhoids    Hyperlipidemia    Hypertension    Hypertension    IBS (irritable bowel syndrome)    Rectal ulcer    Segmental colitis ([place])    Tubular adenoma of colon 05/2009    Family History  Problem Relation Age of Onset   Diabetes Mother    Heart disease Mother    Hyperlipidemia  Mother    Hypertension Mother    Anuerysm Father    Heart disease Father    Hypertension Father    Migraines Daughter    Kidney Stones Daughter    Colon polyps Sister    Lung cancer Sister    Colon polyps Brother    Diabetes Brother    Hyperlipidemia Brother    Hypertension Brother    Colon polyps Daughter    Breast cancer Daughter    Colon cancer Maternal Aunt        Rectal cancer   Cancer Daughter        salivary   Colon cancer Paternal Grandmother        with possible stomach cancer   Heart disease Brother    Crohn's disease Neg Hx    Pancreatic cancer Neg Hx     Past Surgical History:  Procedure Laterality Date   APPENDECTOMY     ARCUATE KERATECTOMY     CATARACT EXTRACTION, BILATERAL  2015   double pallital tori bone removed      HERNIA REPAIR     umbilical   KNEE SURGERY Bilateral    morton's neuroma on rt foot and platar facial release     MOUTH SURGERY     "bone shaved off roof of mouth"   SALIVARY GLAND SURGERY     removal   TONSILLECTOMY     Social History   Occupational History   Occupation: Glass blower/designer    Comment: retired  Tobacco Use   Smoking status: Never   Smokeless tobacco: Never  Vaping Use   Vaping Use: Never used  Substance and Sexual Activity   Alcohol use: Yes    Alcohol/week: 0.0 standard drinks    Comment: rare   Drug use: No   Sexual activity: Yes

## 2021-01-05 ENCOUNTER — Other Ambulatory Visit: Payer: Self-pay

## 2021-01-05 ENCOUNTER — Encounter: Payer: Self-pay | Admitting: Orthopaedic Surgery

## 2021-01-05 ENCOUNTER — Ambulatory Visit (INDEPENDENT_AMBULATORY_CARE_PROVIDER_SITE_OTHER): Payer: Medicare Other | Admitting: Orthopaedic Surgery

## 2021-01-05 DIAGNOSIS — M170 Bilateral primary osteoarthritis of knee: Secondary | ICD-10-CM | POA: Diagnosis not present

## 2021-01-05 MED ORDER — HYALURONAN 30 MG/2ML IX SOSY
30.0000 mg | PREFILLED_SYRINGE | INTRA_ARTICULAR | Status: AC | PRN
  Administered 2021-01-05: 30 mg via INTRA_ARTICULAR

## 2021-01-05 NOTE — Progress Notes (Addendum)
Office Visit Note   Patient: Rebecca [name]           Date of Birth: [birth year]           MRN: [id number] Visit Date: 01/05/2021              Requested by: [name], MD [employer],  [place] [id number] PCP: [name], MD   Assessment & Plan: Visit Diagnoses:  1. Bilateral primary osteoarthritis of knee     Plan: Will inject both knees with Orthovisc for the second injections.  Will return next week to complete the series of 3.  Doing well without any problems on the first injection  Follow-Up Instructions: Return in about 1 week (around 01/12/2021).   Orders:  No orders of the defined types were placed in this encounter.  No orders of the defined types were placed in this encounter.     Procedures: Large Joint Inj: bilateral knee on 01/05/2021 2:48 PM Indications: pain and joint swelling Details: 25 G 1.5 in needle, anteromedial approach  Arthrogram: No  Medications (Right): 30 mg Hyaluronan 30 MG/2ML Medications (Left): 30 mg Hyaluronan 30 MG/2ML Outcome: tolerated well, no immediate complications Procedure, treatment alternatives, risks and benefits explained, specific risks discussed. Consent was given by the patient. Immediately prior to procedure a time out was called to verify the correct patient, procedure, equipment, support staff and site/side marked as required. Patient was prepped and draped in the usual sterile fashion.      Clinical Data: No additional findings.   Subjective: Chief Complaint  Patient presents with   Right Knee - Follow-up    Orthovisc   Left Knee - Follow-up    Orthovisc  Patient presents today for the second set of orthovisc injections into both knees. She started the series on 12/29/2020.  HPI  Review of Systems   Objective: Vital Signs: There were no vitals taken for this visit.  Physical Exam  Ortho Exam neither knee was hot red warm or swollen.  No effusion.  Full quick extension  and flexed over 100 degrees without instability.  Having minimal medial joint pain bilaterally  Specialty Comments:  No specialty comments available.  Imaging: No results found.   PMFS History: Patient Active Problem List   Diagnosis Date Noted   Greater trochanteric bursitis of left hip 12/02/2020   Pain in left leg 11/06/2019   Bilateral primary osteoarthritis of knee 06/11/2019   Pain in left shoulder 05/22/2019   Cellulitis of left upper extremity 10/15/2016   Segmental colitis without complication ([place]) 22/48/2500   Diverticulosis of colon without hemorrhage 02/04/2015   Unspecified vitamin D deficiency 12/25/2013   UTI (urinary tract infection) 09/29/2011   Sclerosing panniculitis 07/28/2011   Atherosclerosis 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 04/20/2011   SPINAL STENOSIS, LUMBAR 03/09/2010   OTHER OSTEOPOROSIS 03/09/2010   SCOLIOSIS, LUMBAR SPINE 03/09/2010   MIGRAINE, OPHTHALMIC 10/29/2009   LUMBAR RADICULOPATHY, ATYPICAL 10/29/2009   POLYNEUROPATHY OTHER DISEASES CLASSIFIED ELSW 09/22/2009   UNSPECIFIED PERIPHERAL VASCULAR DISEASE 09/22/2009   ADENOMATOUS COLONIC POLYP 06/10/2009   History of iron deficiency 05/06/2009   CAROTID ARTERY DISEASE 07/28/2008   Allergic rhinitis 03/20/2008   DYSPHAGIA 03/12/2008   GANGLION OF TENDON SHEATH 08/17/2007   Hypothyroidism 11/15/2006   Hyperlipidemia 11/07/2006   ADVEF, DRUG/MEDICINAL/BIOLOGICAL SUBST NOS 11/07/2006   Essential hypertension 10/18/2006   GERD 10/18/2006   DIVERTICULOSIS, COLON 10/18/2006   Past Medical History:  Diagnosis Date  Anemia    Anxiety    Diverticulitis    Diverticulosis    Gastroparesis    GERD (gastroesophageal reflux disease)    Glaucoma    Hemorrhoids    Hyperlipidemia    Hypertension    Hypertension    IBS (irritable bowel syndrome)    Rectal ulcer    Segmental colitis ([place])    Tubular adenoma of colon 05/2009    Family History  Problem  Relation Age of Onset   Diabetes Mother    Heart disease Mother    Hyperlipidemia Mother    Hypertension Mother    Anuerysm Father    Heart disease Father    Hypertension Father    Migraines Daughter    Kidney Stones Daughter    Colon polyps Sister    Lung cancer Sister    Colon polyps Brother    Diabetes Brother    Hyperlipidemia Brother    Hypertension Brother    Colon polyps Daughter    Breast cancer Daughter    Colon cancer Maternal Aunt        Rectal cancer   Cancer Daughter        salivary   Colon cancer Paternal Grandmother        with possible stomach cancer   Heart disease Brother    Crohn's disease Neg Hx    Pancreatic cancer Neg Hx     Past Surgical History:  Procedure Laterality Date   APPENDECTOMY     ARCUATE KERATECTOMY     CATARACT EXTRACTION, BILATERAL  2015   double pallital tori bone removed      HERNIA REPAIR     umbilical   KNEE SURGERY Bilateral    morton's neuroma on rt foot and platar facial release     MOUTH SURGERY     "bone shaved off roof of mouth"   SALIVARY GLAND SURGERY     removal   TONSILLECTOMY     Social History   Occupational History   Occupation: Glass blower/designer    Comment: retired  Tobacco Use   Smoking status: Never   Smokeless tobacco: Never  Vaping Use   Vaping Use: Never used  Substance and Sexual Activity   Alcohol use: Yes    Alcohol/week: 0.0 standard drinks    Comment: rare   Drug use: No   Sexual activity: Yes

## 2021-01-06 ENCOUNTER — Ambulatory Visit (INDEPENDENT_AMBULATORY_CARE_PROVIDER_SITE_OTHER): Payer: Medicare Other

## 2021-01-06 DIAGNOSIS — Z23 Encounter for immunization: Secondary | ICD-10-CM

## 2021-01-12 ENCOUNTER — Encounter: Payer: Self-pay | Admitting: Orthopaedic Surgery

## 2021-01-12 ENCOUNTER — Ambulatory Visit (INDEPENDENT_AMBULATORY_CARE_PROVIDER_SITE_OTHER): Payer: Medicare Other | Admitting: Orthopaedic Surgery

## 2021-01-12 ENCOUNTER — Other Ambulatory Visit: Payer: Self-pay

## 2021-01-12 DIAGNOSIS — M170 Bilateral primary osteoarthritis of knee: Secondary | ICD-10-CM

## 2021-01-12 MED ORDER — HYALURONAN 30 MG/2ML IX SOSY
30.0000 mg | PREFILLED_SYRINGE | INTRA_ARTICULAR | Status: AC | PRN
  Administered 2021-01-12: 30 mg via INTRA_ARTICULAR

## 2021-01-12 NOTE — Progress Notes (Addendum)
Office Visit Note   Patient: Rebecca [name]           Date of Birth: 1932-10-28           MRN: [id number] Visit Date: 01/12/2021              Requested by: [name], MD [place],  [place] [address] PCP: [name], MD   Assessment & Plan: Visit Diagnoses:  1. Bilateral primary osteoarthritis of knee     Plan: Third and final Orthovisc injection both knees.  Doing well.  Feels like it is made more of a difference on the left than the right knee  Follow-Up Instructions: Return if symptoms worsen or fail to improve.   Orders:  No orders of the defined types were placed in this encounter.  No orders of the defined types were placed in this encounter.     Procedures: Large Joint Inj: bilateral knee on 01/12/2021 4:42 PM Indications: pain and joint swelling Details: 25 G 1.5 in needle, anteromedial approach  Arthrogram: No  Medications (Right): 30 mg Hyaluronan 30 MG/2ML Medications (Left): 30 mg Hyaluronan 30 MG/2ML Outcome: tolerated well, no immediate complications Procedure, treatment alternatives, risks and benefits explained, specific risks discussed. Consent was given by the patient. Immediately prior to procedure a time out was called to verify the correct patient, procedure, equipment, support staff and site/side marked as required. Patient was prepped and draped in the usual sterile fashion.      Clinical Data: No additional findings.   Subjective: Chief Complaint  Patient presents with   Right Knee - Follow-up    Bilateral orthovisc started 12/29/2020   Left Knee - Follow-up  Patient presents today for the third bilateral orthovisc injections. She started the series on 12/29/2020.  HPI  Review of Systems   Objective: Vital Signs: There were no vitals taken for this visit.  Physical Exam  Ortho Exam neither knee was hot warm or red.  Might have a small effusion on the right knee with some mild medial joint  pain.  Full extension flexed over 100 degrees without instability Specialty Comments:  No specialty comments available.  Imaging: No results found.   PMFS History: Patient Active Problem List   Diagnosis Date Noted   Greater trochanteric bursitis of left hip 12/02/2020   Pain in left leg 11/06/2019   Bilateral primary osteoarthritis of knee 06/11/2019   Pain in left shoulder 05/22/2019   Cellulitis of left upper extremity 10/15/2016   Segmental colitis without complication ([place]) 64/40/3474   Diverticulosis of colon without hemorrhage 02/04/2015   Unspecified vitamin D deficiency 12/25/2013   UTI (urinary tract infection) 09/29/2011   Sclerosing panniculitis 07/28/2011   Atherosclerosis 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 04/20/2011   SPINAL STENOSIS, LUMBAR 03/09/2010   OTHER OSTEOPOROSIS 03/09/2010   SCOLIOSIS, LUMBAR SPINE 03/09/2010   MIGRAINE, OPHTHALMIC 10/29/2009   LUMBAR RADICULOPATHY, ATYPICAL 10/29/2009   POLYNEUROPATHY OTHER DISEASES CLASSIFIED ELSW 09/22/2009   UNSPECIFIED PERIPHERAL VASCULAR DISEASE 09/22/2009   ADENOMATOUS COLONIC POLYP 06/10/2009   History of iron deficiency 05/06/2009   CAROTID ARTERY DISEASE 07/28/2008   Allergic rhinitis 03/20/2008   DYSPHAGIA 03/12/2008   GANGLION OF TENDON SHEATH 08/17/2007   Hypothyroidism 11/15/2006   Hyperlipidemia 11/07/2006   ADVEF, DRUG/MEDICINAL/BIOLOGICAL SUBST NOS 11/07/2006   Essential hypertension 10/18/2006   GERD 10/18/2006   DIVERTICULOSIS, COLON 10/18/2006   Past Medical History:  Diagnosis Date   Anemia  Anxiety    Diverticulitis    Diverticulosis    Gastroparesis    GERD (gastroesophageal reflux disease)    Glaucoma    Hemorrhoids    Hyperlipidemia    Hypertension    Hypertension    IBS (irritable bowel syndrome)    Rectal ulcer    Segmental colitis ([place])    Tubular adenoma of colon 05/2009    Family History  Problem Relation Age of Onset   Diabetes  Mother    Heart disease Mother    Hyperlipidemia Mother    Hypertension Mother    Anuerysm Father    Heart disease Father    Hypertension Father    Migraines Daughter    Kidney Stones Daughter    Colon polyps Sister    Lung cancer Sister    Colon polyps Brother    Diabetes Brother    Hyperlipidemia Brother    Hypertension Brother    Colon polyps Daughter    Breast cancer Daughter    Colon cancer Maternal Aunt        Rectal cancer   Cancer Daughter        salivary   Colon cancer Paternal Grandmother        with possible stomach cancer   Heart disease Brother    Crohn's disease Neg Hx    Pancreatic cancer Neg Hx     Past Surgical History:  Procedure Laterality Date   APPENDECTOMY     ARCUATE KERATECTOMY     CATARACT EXTRACTION, BILATERAL  2015   double pallital tori bone removed      HERNIA REPAIR     umbilical   KNEE SURGERY Bilateral    morton's neuroma on rt foot and platar facial release     MOUTH SURGERY     "bone shaved off roof of mouth"   SALIVARY GLAND SURGERY     removal   TONSILLECTOMY     Social History   Occupational History   Occupation: Glass blower/designer    Comment: retired  Tobacco Use   Smoking status: Never   Smokeless tobacco: Never  Vaping Use   Vaping Use: Never used  Substance and Sexual Activity   Alcohol use: Yes    Alcohol/week: 0.0 standard drinks    Comment: rare   Drug use: No   Sexual activity: Yes

## 2021-02-10 ENCOUNTER — Ambulatory Visit (INDEPENDENT_AMBULATORY_CARE_PROVIDER_SITE_OTHER): Payer: Medicare Other | Admitting: Family Medicine

## 2021-02-10 ENCOUNTER — Encounter: Payer: Self-pay | Admitting: Family Medicine

## 2021-02-10 ENCOUNTER — Other Ambulatory Visit: Payer: Self-pay

## 2021-02-10 VITALS — BP 142/62 | HR 73 | Temp 98.7°F | Ht 62.0 in | Wt 120.9 lb

## 2021-02-10 DIAGNOSIS — R109 Unspecified abdominal pain: Secondary | ICD-10-CM | POA: Diagnosis not present

## 2021-02-10 DIAGNOSIS — R17 Unspecified jaundice: Secondary | ICD-10-CM | POA: Diagnosis not present

## 2021-02-10 DIAGNOSIS — R110 Nausea: Secondary | ICD-10-CM

## 2021-02-10 DIAGNOSIS — R079 Chest pain, unspecified: Secondary | ICD-10-CM

## 2021-02-10 MED ORDER — ONDANSETRON 4 MG PO TBDP: 4.0000 mg | ORAL_TABLET | Freq: Three times a day (TID) | ORAL | 0 refills | Status: DC | PRN

## 2021-02-10 NOTE — Patient Instructions (Signed)
We are setting up Abdominal ultrasound and will call with labs as soon as they are back.   IF you have any fever, worsening abdominal pain, or recurrent vomiting go to the ER.

## 2021-02-10 NOTE — Progress Notes (Signed)
Established Patient Office Visit  Subjective:  Patient ID: Rebecca [name], female    DOB: 10/15/1932  Age: 87 y.o. MRN: [id number]  CC:  Chief Complaint  Patient presents with   Nausea    X5 days, denies Covid testing   Chest Pain    X5 days   Fever    Temperature of 100.5 degrees yesterday    HPI Rebecca [name] past medical history significant for hypertension, peripheral vascular disease, GERD, hypothyroidism, lumbar scoliosis, osteoporosis    presents now with about 5-day history of some intermittent nausea.  She states that yesterday she had temperature 100.5 but none today.  Denies any cough.  No stool changes.  She had some pain radiating up into the chest area and does have some frequent substernal burning.  She takes Nexium and Pepcid at baseline.  She has had some intermittent "dry heaves ".  No localizing abdominal pain.  No known history of gallstones.  No history of pancreatitis.  No alcohol use.  3 pound weight loss since August.    Wt Readings from Last 3 Encounters:  02/10/21 120 lb 14.4 oz (54.8 kg)  12/02/20 123 lb (55.8 kg)  11/03/20 123 lb (55.8 kg)    .   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[place])    Tubular adenoma of colon 05/2009    Past Surgical History:  Procedure Laterality Date   APPENDECTOMY     ARCUATE KERATECTOMY     CATARACT EXTRACTION, BILATERAL  2015   double pallital tori bone removed      HERNIA REPAIR     umbilical   KNEE SURGERY Bilateral    morton's neuroma on rt foot and platar facial release     MOUTH SURGERY     "bone shaved off roof of mouth"   SALIVARY GLAND SURGERY     removal   TONSILLECTOMY      Family History  Problem Relation Age of Onset    Diabetes Mother    Heart disease Mother    Hyperlipidemia Mother    Hypertension Mother    Anuerysm Father    Heart disease Father    Hypertension Father    Migraines Daughter    Kidney Stones Daughter    Colon polyps Sister    Lung cancer Sister    Colon polyps Brother    Diabetes Brother    Hyperlipidemia Brother    Hypertension Brother    Colon polyps Daughter    Breast cancer Daughter    Colon cancer Maternal Aunt        Rectal cancer   Cancer Daughter        salivary   Colon cancer Paternal Grandmother        with possible stomach cancer   Heart disease Brother    Crohn's disease Neg Hx    Pancreatic cancer Neg Hx     Social History   Socioeconomic History   Marital status: Widowed    Spouse name: Not on file   Number of children: 3   Years of education: Not on file   Highest education level: Not  on file  Occupational History   Occupation: Glass blower/designer    Comment: retired  Tobacco Use   Smoking status: Never   Smokeless tobacco: Never  Vaping Use   Vaping Use: Never used  Substance and Sexual Activity   Alcohol use: Yes    Alcohol/week: 0.0 standard drinks    Comment: rare   Drug use: No   Sexual activity: Yes  Other Topics Concern   Not on file  Social History Narrative   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   Lives alone on [address], 3 children, 7 grandchildren, 2 Designer, industrial/product, all local and supportive   Attends church   Enjoys reading, especially mysteries   Walks for exercise                     Social Determinants of Radio broadcast assistant Strain: Low Risk    Difficulty of Paying Living Expenses: Not hard at all  Food Insecurity: Not on file  Transportation Needs: No Transportation Needs   Lack of Transportation (Medical): No   Lack of Transportation (Non-Medical): No  Physical Activity: Not on file  Stress: Not on file  Social Connections: Not on file  Intimate Partner Violence:  Not on file    Outpatient Medications Prior to Visit  Medication Sig Dispense Refill   ALPRAZolam (XANAX) 0.25 MG tablet TAKE 1 TABLET(0.25 MG) BY MOUTH AT BEDTIME AS NEEDED 20 tablet 1   Calcium Carb-Cholecalciferol (CALCIUM-VITAMIN D) 600-400 MG-UNIT TABS Take 1 tablet by mouth daily.     Cholecalciferol (VITAMIN D3) 2000 UNITS TABS Take 1 tablet by mouth daily.     diclofenac sodium (VOLTAREN) 1 % GEL Apply 2 g topically 4 (four) times daily. 1 Tube 1   esomeprazole (NEXIUM) 20 MG capsule Take 20 mg by mouth 2 (two) times daily before a meal.     levothyroxine (SYNTHROID) 88 MCG tablet TAKE 1 TABLET(88 MCG) BY MOUTH DAILY 90 tablet 1   losartan (COZAAR) 100 MG tablet TAKE 1 TABLET BY MOUTH EVERY DAY 90 tablet 1   metoCLOPramide (REGLAN) 5 MG tablet TAKE 1 TABLET BY MOUTH TWICE DAILY WITH MEALS 60 tablet 3   rosuvastatin (CRESTOR) 20 MG tablet TAKE 1 TABLET(20 MG) BY MOUTH DAILY 30 tablet 5   vitamin B-12 (CYANOCOBALAMIN) 500 MCG tablet Take 1,000 mcg by mouth every morning.     vitamin C (ASCORBIC ACID) 500 MG tablet Take 500 mg by mouth daily.     latanoprost (XALATAN) 0.005 % ophthalmic solution 1 drop at bedtime.     timolol (TIMOPTIC) 0.5 % ophthalmic solution 1 drop 2 (two) times daily.     gabapentin (NEURONTIN) 100 MG capsule TAKE 2 CAPSULES(200 MG) BY MOUTH AT BEDTIME 180 capsule 0   Latanoprostene Bunod (VYZULTA) 0.024 % SOLN Apply to eye daily.     No facility-administered medications prior to visit.    Allergies  Allergen Reactions   Levaquin [Levofloxacin] Other (See Comments)    Very nauseous and shakey   Sulfamethoxazole-Trimethoprim Nausea Only    shakey   Lactose Intolerance (Gi) Diarrhea and Other (See Comments)    Stomach hurts    Tramadol Hcl Nausea Only and Palpitations   Tramadol Hcl Nausea Only and Palpitations   Augmentin [Amoxicillin-Pot Clavulanate] Diarrhea   Ciprofloxacin Nausea Only    REACTION: Thrush, Nausea, Shakey   Pneumococcal Vaccine  Polyvalent     REACTION: RED AND RAISED RASH ON ARM   Adhesive [  Tape] Rash   Codeine Nausea Only    ROS Review of Systems  Constitutional:  Positive for appetite change.  HENT:  Negative for trouble swallowing.   Respiratory:  Negative for cough and shortness of breath.   Cardiovascular:        See HPI  Gastrointestinal:  Positive for abdominal pain and nausea. Negative for blood in stool and diarrhea.  Genitourinary:  Negative for dysuria.     Objective:    Physical Exam Vitals reviewed.  Constitutional:      Comments: Patient is in no distress but does have some abnormal yellowing of the skin especially evident on her trunk  Cardiovascular:     Rate and Rhythm: Normal rate and regular rhythm.  Pulmonary:     Effort: Pulmonary effort is normal.  Abdominal:     General: Bowel sounds are normal. There is no abdominal bruit.     Palpations: Abdomen is soft. There is no hepatomegaly, splenomegaly or mass.     Tenderness: There is no abdominal tenderness. There is no guarding or rebound.     Comments: No RUQ or epigastric tenderness.   Musculoskeletal:     Cervical back: Neck supple.  Skin:    Comments: She has some obvious jaundice especially involving her trunk.  She has some make-up on her face which makes this less evident on the face    BP (!) 142/62 (BP Location: Left Arm, Patient Position: Sitting, Cuff Size: Normal)   Pulse 73   Temp 98.7 F (37.1 C) (Oral)   Ht 5\' 2"  (1.575 m)   Wt 120 lb 14.4 oz (54.8 kg)   BMI 22.11 kg/m  Wt Readings from Last 3 Encounters:  02/10/21 120 lb 14.4 oz (54.8 kg)  12/02/20 123 lb (55.8 kg)  11/03/20 123 lb (55.8 kg)     Health Maintenance Due  Topic Date Due   Pneumonia Vaccine 33+ Years old (2 - PCV) 03/04/2000    There are no preventive care reminders to display for this patient.  Lab Results  Component Value Date   TSH 0.73 03/20/2020   Lab Results  Component Value Date   WBC 11.5 (H) 05/01/2018   HGB 12.6  05/01/2018   HCT 37.7 05/01/2018   MCV 100.6 (H) 05/01/2018   PLT 226.0 05/01/2018   Lab Results  Component Value Date   NA 133 (L) 03/20/2020   K 4.6 03/20/2020   CO2 27 03/20/2020   GLUCOSE 101 (H) 03/20/2020   BUN 12 03/20/2020   CREATININE 0.90 (H) 03/20/2020   BILITOT 0.6 08/21/2020   ALKPHOS 71 08/21/2020   AST 17 08/21/2020   ALT 13 08/21/2020   PROT 6.2 08/21/2020   ALBUMIN 4.1 08/21/2020   CALCIUM 10.1 03/20/2020   ANIONGAP 7 02/23/2016   GFR 59.65 (L) 02/15/2019   Lab Results  Component Value Date   CHOL 132 08/21/2020   Lab Results  Component Value Date   HDL 39.20 08/21/2020   Lab Results  Component Value Date   LDLCALC 55 08/21/2020   Lab Results  Component Value Date   TRIG 188.0 (H) 08/21/2020   Lab Results  Component Value Date   CHOLHDL 3 08/21/2020   Lab Results  Component Value Date   HGBA1C 5.6 07/26/2006      Assessment & Plan:   Problem List Items Addressed This Visit   None Visit Diagnoses     Jaundice    -  Primary   Relevant Orders  CMP   Lipase   Nausea       Abdominal pain, unspecified abdominal location       Relevant Orders   CBC with Differential/Platelet   Lipase   Sedimentation rate   Chest pain, unspecified type       Relevant Orders   DG Chest 2 View     Patient presents with approxi-5-day history of some intermittent nausea and upper abdominal pain nonlocalizing radiating toward the chest.  She has longstanding history of GERD and relates frequent substernal burning recently.  On exam she has some obvious jaundice involving her trunk ,head , and neck.  Needs further evaluation. We explained may need more urgent evaluation (ie ER) for any progressive pain, recurrent fever, or recurrent vomiting.   -Obtain PA and lateral chest x-ray----we sent her over to our x-ray but apparently our x-ray technician left at 2:30 PM today  -Obtain labs with CBC, comprehensive metabolic panel, lipase, sed rate  -set up  imaging with abdominal ultrasound ASAP.  -Send in some Zofran 4 mg ODT 1 every 8 hours as needed for nausea  -Patient knows to go to the hospital immediately for any fever, worsening abdominal pain, recurrent nausea or vomiting  No orders of the defined types were placed in this encounter.   Follow-up: No follow-ups on file.    Carolann Littler, MD

## 2021-02-11 LAB — CBC WITH DIFFERENTIAL/PLATELET
Basophils Absolute: 0.1 10*3/uL (ref 0.0–0.1)
Basophils Relative: 0.6 % (ref 0.0–3.0)
Eosinophils Absolute: 0.1 10*3/uL (ref 0.0–0.7)
Eosinophils Relative: 0.4 % (ref 0.0–5.0)
HCT: 34.1 % — ABNORMAL LOW (ref 36.0–46.0)
Hemoglobin: 11.5 g/dL — ABNORMAL LOW (ref 12.0–15.0)
Lymphocytes Relative: 65.3 % — ABNORMAL HIGH (ref 12.0–46.0)
Lymphs Abs: 10.9 10*3/uL — ABNORMAL HIGH (ref 0.7–4.0)
MCHC: 33.6 g/dL (ref 30.0–36.0)
MCV: 98.6 fl (ref 78.0–100.0)
Monocytes Absolute: 0.6 10*3/uL (ref 0.1–1.0)
Monocytes Relative: 3.3 % (ref 3.0–12.0)
Neutro Abs: 5.1 10*3/uL (ref 1.4–7.7)
Neutrophils Relative %: 30.4 % — ABNORMAL LOW (ref 43.0–77.0)
Platelets: 169 10*3/uL (ref 150.0–400.0)
RBC: 3.46 Mil/uL — ABNORMAL LOW (ref 3.87–5.11)
RDW: 13.2 % (ref 11.5–15.5)
WBC: 16.8 10*3/uL — ABNORMAL HIGH (ref 4.0–10.5)

## 2021-02-11 LAB — COMPREHENSIVE METABOLIC PANEL
ALT: 184 U/L — ABNORMAL HIGH (ref 0–35)
AST: 67 U/L — ABNORMAL HIGH (ref 0–37)
Albumin: 4 g/dL (ref 3.5–5.2)
Alkaline Phosphatase: 154 U/L — ABNORMAL HIGH (ref 39–117)
BUN: 16 mg/dL (ref 6–23)
CO2: 26 mEq/L (ref 19–32)
Calcium: 9.7 mg/dL (ref 8.4–10.5)
Chloride: 95 mEq/L — ABNORMAL LOW (ref 96–112)
Creatinine, Ser: 0.98 mg/dL (ref 0.40–1.20)
GFR: 52.46 mL/min — ABNORMAL LOW (ref >60.00)
Glucose, Bld: 103 mg/dL — ABNORMAL HIGH (ref 70–99)
Potassium: 3.2 mEq/L — ABNORMAL LOW (ref 3.5–5.1)
Sodium: 129 mEq/L — ABNORMAL LOW (ref 135–145)
Total Bilirubin: 3.7 mg/dL — ABNORMAL HIGH (ref 0.2–1.2)
Total Protein: 6.4 g/dL (ref 6.0–8.3)

## 2021-02-11 LAB — SEDIMENTATION RATE: Sed Rate: 22 mm/hr (ref 0–30)

## 2021-02-11 LAB — LIPASE: Lipase: 27 U/L (ref 11.0–59.0)

## 2021-02-15 ENCOUNTER — Ambulatory Visit
Admission: RE | Admit: 2021-02-15 | Discharge: 2021-02-15 | Disposition: A | Discharge status: Home / Self Care | Payer: Medicare Other | Source: Ambulatory Visit | Attending: Family Medicine | Admitting: Family Medicine

## 2021-02-15 DIAGNOSIS — N281 Cyst of kidney, acquired: Secondary | ICD-10-CM | POA: Diagnosis not present

## 2021-02-15 DIAGNOSIS — R17 Unspecified jaundice: Secondary | ICD-10-CM | POA: Diagnosis not present

## 2021-02-15 DIAGNOSIS — K7689 Other specified diseases of liver: Secondary | ICD-10-CM | POA: Diagnosis not present

## 2021-02-15 DIAGNOSIS — R110 Nausea: Secondary | ICD-10-CM | POA: Diagnosis not present

## 2021-02-15 DIAGNOSIS — R109 Unspecified abdominal pain: Secondary | ICD-10-CM

## 2021-02-17 ENCOUNTER — Ambulatory Visit (INDEPENDENT_AMBULATORY_CARE_PROVIDER_SITE_OTHER): Payer: Medicare Other | Admitting: Family Medicine

## 2021-02-17 VITALS — BP 142/70 | HR 53 | Temp 98.0°F | Wt 117.4 lb

## 2021-02-17 DIAGNOSIS — E871 Hypo-osmolality and hyponatremia: Secondary | ICD-10-CM | POA: Diagnosis not present

## 2021-02-17 DIAGNOSIS — E876 Hypokalemia: Secondary | ICD-10-CM

## 2021-02-17 DIAGNOSIS — R7401 Elevation of levels of liver transaminase levels: Secondary | ICD-10-CM | POA: Diagnosis not present

## 2021-02-17 LAB — HEPATIC FUNCTION PANEL
ALT: 77 U/L — ABNORMAL HIGH (ref 0–35)
AST: 37 U/L (ref 0–37)
Albumin: 4.1 g/dL (ref 3.5–5.2)
Alkaline Phosphatase: 123 U/L — ABNORMAL HIGH (ref 39–117)
Bilirubin, Direct: 0.4 mg/dL — ABNORMAL HIGH (ref 0.0–0.3)
Total Bilirubin: 1.2 mg/dL (ref 0.2–1.2)
Total Protein: 6.5 g/dL (ref 6.0–8.3)

## 2021-02-17 LAB — BASIC METABOLIC PANEL
BUN: 10 mg/dL (ref 6–23)
CO2: 27 mEq/L (ref 19–32)
Calcium: 10.2 mg/dL (ref 8.4–10.5)
Chloride: 98 mEq/L (ref 96–112)
Creatinine, Ser: 0.87 mg/dL (ref 0.40–1.20)
GFR: 60.51 mL/min (ref >60.00)
Glucose, Bld: 98 mg/dL (ref 70–99)
Potassium: 4.9 mEq/L (ref 3.5–5.1)
Sodium: 132 mEq/L — ABNORMAL LOW (ref 135–145)

## 2021-02-17 NOTE — Progress Notes (Signed)
Established Patient Office Visit  Subjective:  Patient ID: Rebecca [name], female    DOB: 1932-09-29  Age: 87 y.o. MRN: [id number]  CC:  Chief Complaint  Patient presents with   Follow-up    HPI Rebecca [name] presents for follow-up regarding recent acute jaundice symptoms.  She was seen here on the ninth.  She had had 5-day history of intermittent nausea and reported temperature up to 100.5.  Atypical chest symptoms.  Some frequent substernal burning.  No localizing abdominal pain.  No alcohol use.  Noticed on exam that clinically she appeared to have some mild jaundice.  She was nontoxic in appearance and afebrile at time of exam last week.  We obtain additional labs.  Sed rate was normal.  Lipase normal.  Sodium 129 with potassium 3.2.  Total bilirubin 3.7, alkaline phosphatase 154, AST 67, ALT 184.  Abdominal ultrasound obtained.  This showed heterogenous masslike material in the gallbladder favored to reflect sludge.  No gallbladder wall thickening.  Multiple hepatic cysts.  Mild intrahepatic biliary dilatation.  No worrisome masses.    Patient is felt better since then.  Still has mild decreased appetite but improving.  She is drinking plenty of fluids.  No fever.  No abdominal pain at this time.  No stool changes.  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[place])    Tubular adenoma of colon 05/2009    Past Surgical History:  Procedure Laterality Date   APPENDECTOMY     ARCUATE KERATECTOMY     CATARACT EXTRACTION, BILATERAL  2015   double pallital tori bone removed      HERNIA REPAIR     umbilical   KNEE SURGERY Bilateral    morton's neuroma on rt foot and platar facial release     MOUTH SURGERY      "bone shaved off roof of mouth"   SALIVARY GLAND SURGERY     removal   TONSILLECTOMY      Family History  Problem Relation Age of Onset   Diabetes Mother    Heart disease Mother    Hyperlipidemia Mother    Hypertension Mother    Anuerysm Father    Heart disease Father    Hypertension Father    Migraines Daughter    Kidney Stones Daughter    Colon polyps Sister    Lung cancer Sister    Colon polyps Brother    Diabetes Brother    Hyperlipidemia Brother    Hypertension Brother    Colon polyps Daughter    Breast cancer Daughter    Colon cancer Maternal Aunt        Rectal cancer   Cancer Daughter        salivary   Colon cancer Paternal Grandmother        with possible stomach cancer   Heart disease Brother    Crohn's disease Neg Hx    Pancreatic cancer Neg Hx     Social History   Socioeconomic History   Marital status: Widowed    Spouse name: Not on file   Number of children: 3   Years of education: Not  on file   Highest education level: Not on file  Occupational History   Occupation: Glass blower/designer    Comment: retired  Tobacco Use   Smoking status: Never   Smokeless tobacco: Never  Vaping Use   Vaping Use: Never used  Substance and Sexual Activity   Alcohol use: Yes    Alcohol/week: 0.0 standard drinks    Comment: rare   Drug use: No   Sexual activity: Yes  Other Topics Concern   Not on file  Social History Narrative   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   Lives alone on [address], 3 children, 7 grandchildren, 2 Designer, industrial/product, all local and supportive   Attends church   Enjoys reading, especially mysteries   Walks for exercise                     Social Determinants of Radio broadcast assistant Strain: Low Risk    Difficulty of Paying Living Expenses: Not hard at all  Food Insecurity: Not on file  Transportation Needs: No Transportation Needs   Lack of Transportation (Medical): No   Lack of  Transportation (Non-Medical): No  Physical Activity: Not on file  Stress: Not on file  Social Connections: Not on file  Intimate Partner Violence: Not on file    Outpatient Medications Prior to Visit  Medication Sig Dispense Refill   ALPRAZolam (XANAX) 0.25 MG tablet TAKE 1 TABLET(0.25 MG) BY MOUTH AT BEDTIME AS NEEDED 20 tablet 1   Calcium Carb-Cholecalciferol (CALCIUM-VITAMIN D) 600-400 MG-UNIT TABS Take 1 tablet by mouth daily.     Cholecalciferol (VITAMIN D3) 2000 UNITS TABS Take 1 tablet by mouth daily.     diclofenac sodium (VOLTAREN) 1 % GEL Apply 2 g topically 4 (four) times daily. 1 Tube 1   esomeprazole (NEXIUM) 20 MG capsule Take 20 mg by mouth 2 (two) times daily before a meal.     latanoprost (XALATAN) 0.005 % ophthalmic solution 1 drop at bedtime.     levothyroxine (SYNTHROID) 88 MCG tablet TAKE 1 TABLET(88 MCG) BY MOUTH DAILY 90 tablet 1   losartan (COZAAR) 100 MG tablet TAKE 1 TABLET BY MOUTH EVERY DAY 90 tablet 1   metoCLOPramide (REGLAN) 5 MG tablet TAKE 1 TABLET BY MOUTH TWICE DAILY WITH MEALS 60 tablet 3   ondansetron (ZOFRAN ODT) 4 MG disintegrating tablet Take 1 tablet (4 mg total) by mouth every 8 (eight) hours as needed for nausea or vomiting. 12 tablet 0   rosuvastatin (CRESTOR) 20 MG tablet TAKE 1 TABLET(20 MG) BY MOUTH DAILY 30 tablet 5   timolol (TIMOPTIC) 0.5 % ophthalmic solution 1 drop 2 (two) times daily.     vitamin B-12 (CYANOCOBALAMIN) 500 MCG tablet Take 1,000 mcg by mouth every morning.     vitamin C (ASCORBIC ACID) 500 MG tablet Take 500 mg by mouth daily.     No facility-administered medications prior to visit.    Allergies  Allergen Reactions   Levaquin [Levofloxacin] Other (See Comments)    Very nauseous and shakey   Sulfamethoxazole-Trimethoprim Nausea Only    shakey   Lactose Intolerance (Gi) Diarrhea and Other (See Comments)    Stomach hurts    Tramadol Hcl Nausea Only and Palpitations   Tramadol Hcl Nausea Only and Palpitations    Augmentin [Amoxicillin-Pot Clavulanate] Diarrhea   Ciprofloxacin Nausea Only    REACTION: Thrush, Nausea, Shakey   Pneumococcal Vaccine Polyvalent     REACTION: RED AND RAISED  RASH ON ARM   Adhesive [Tape] Rash   Codeine Nausea Only    ROS Review of Systems  Constitutional:  Negative for chills and fever.  Respiratory:  Negative for cough and shortness of breath.   Cardiovascular:  Negative for chest pain.  Gastrointestinal:  Negative for abdominal pain, nausea and vomiting.  Hematological:  Negative for adenopathy.  Psychiatric/Behavioral:  Negative for confusion.      Objective:    Physical Exam Vitals reviewed.  Cardiovascular:     Rate and Rhythm: Normal rate and regular rhythm.  Pulmonary:     Effort: Pulmonary effort is normal.     Breath sounds: Normal breath sounds.  Abdominal:     General: There is no distension.     Palpations: Abdomen is soft. There is no mass.     Tenderness: There is no abdominal tenderness. There is no guarding or rebound.  Skin:    Comments: Jaundice which was previously noted last week appears to have resolved at this time.  Neurological:     Mental Status: She is alert.    BP (!) 142/70 (BP Location: Left Arm, Patient Position: Sitting, Cuff Size: Normal)   Pulse (!) 53   Temp 98 F (36.7 C) (Oral)   Wt 117 lb 6.4 oz (53.3 kg)   SpO2 98%   BMI 21.47 kg/m  Wt Readings from Last 3 Encounters:  02/17/21 117 lb 6.4 oz (53.3 kg)  02/10/21 120 lb 14.4 oz (54.8 kg)  12/02/20 123 lb (55.8 kg)     Health Maintenance Due  Topic Date Due   Pneumonia Vaccine 58+ Years old (2 - PCV) 03/04/2000    There are no preventive care reminders to display for this patient.  Lab Results  Component Value Date   TSH 0.73 03/20/2020   Lab Results  Component Value Date   WBC 16.8 (H) 02/10/2021   HGB 11.5 (L) 02/10/2021   HCT 34.1 (L) 02/10/2021   MCV 98.6 02/10/2021   PLT 169.0 02/10/2021   Lab Results  Component Value Date   NA 129 (L)  02/10/2021   K 3.2 (L) 02/10/2021   CO2 26 02/10/2021   GLUCOSE 103 (H) 02/10/2021   BUN 16 02/10/2021   CREATININE 0.98 02/10/2021   BILITOT 3.7 (H) 02/10/2021   ALKPHOS 154 (H) 02/10/2021   AST 67 (H) 02/10/2021   ALT 184 (H) 02/10/2021   PROT 6.4 02/10/2021   ALBUMIN 4.0 02/10/2021   CALCIUM 9.7 02/10/2021   ANIONGAP 7 02/23/2016   GFR 52.46 (L) 02/10/2021   Lab Results  Component Value Date   CHOL 132 08/21/2020   Lab Results  Component Value Date   HDL 39.20 08/21/2020   Lab Results  Component Value Date   LDLCALC 55 08/21/2020   Lab Results  Component Value Date   TRIG 188.0 (H) 08/21/2020   Lab Results  Component Value Date   CHOLHDL 3 08/21/2020   Lab Results  Component Value Date   HGBA1C 5.6 07/26/2006    EXAM: ABDOMEN ULTRASOUND COMPLETE   COMPARISON:  CT abdomen and pelvis 02/23/2016   FINDINGS: Gallbladder: Heterogeneous, masslike material in the gallbladder lumen measuring at least 3.5 cm in size which appears mobile and does not demonstrate internal vascularity on color Doppler imaging. Normal gallbladder wall thickness. No sonographic Murphy sign noted by sonographer.   Common bile duct: Diameter: 7 mm   Liver: Mild intrahepatic biliary dilatation. Multiple chronic hepatic cysts with the largest measuring 2.9 cm and  containing multiple thin internal septations. Portal vein is patent on color Doppler imaging with normal direction of blood flow towards the liver.   IVC: Infrahepatic IVC obscured by bowel gas. No abnormality visualized.   Pancreas: Visualized portion unremarkable.   Spleen: Size and appearance within normal limits.   Right Kidney: Length: 9.5 cm. Limited assessment due to bowel gas. Echogenicity within normal limits. No hydronephrosis. 1.1 cm cyst in the lower pole.   Left Kidney: Length: 11.4 cm. Limited assessment due to bowel gas. Echogenicity within normal limits. No hydronephrosis. 1.6 cm cyst in the  interpolar region.   Abdominal aorta: Obscured by bowel gas   Other findings: None.   IMPRESSION: 1. Heterogeneous, masslike material in the gallbladder favored to reflect tumefactive sludge. 2. Mild intrahepatic biliary dilatation. 3. Multiple hepatic cysts.        Assessment & Plan:   #1 transient jaundice with recent ultrasound results as above.  with gallbladder sludge but no gallbladder wall thickening.  No evidence for pancreatitis recently.  Symptoms improved at this time. -We discussed possible general surgery referral but at this point she wishes to wait.  Certainly if she has further evidence of gallbladder issues would need to see a general surgeon. -Follow-up immediately for recurrent nausea/vomiting, recurrent fever, or right upper quadrant abdominal pain -Recheck hepatic panel  #2 recent electrolyte disturbance with hyponatremia and hypokalemia.  Suspect this was related to poor intake mostly.  Recheck basic metabolic panel today.   No orders of the defined types were placed in this encounter.   Follow-up: No follow-ups on file.    Carolann Littler, MD

## 2021-02-17 NOTE — Patient Instructions (Signed)
Follow up for any recurrent fever, abdominal pain, or vomiting.

## 2021-02-19 ENCOUNTER — Other Ambulatory Visit: Payer: Self-pay | Admitting: Family Medicine

## 2021-02-19 DIAGNOSIS — K2100 Gastro-esophageal reflux disease with esophagitis, without bleeding: Secondary | ICD-10-CM

## 2021-03-01 DIAGNOSIS — N8110 Cystocele, unspecified: Secondary | ICD-10-CM | POA: Diagnosis not present

## 2021-03-01 DIAGNOSIS — Z4689 Encounter for fitting and adjustment of other specified devices: Secondary | ICD-10-CM | POA: Diagnosis not present

## 2021-03-01 DIAGNOSIS — N816 Rectocele: Secondary | ICD-10-CM | POA: Diagnosis not present

## 2021-03-01 DIAGNOSIS — N952 Postmenopausal atrophic vaginitis: Secondary | ICD-10-CM | POA: Diagnosis not present

## 2021-03-05 ENCOUNTER — Telehealth: Payer: Medicare Other

## 2021-03-10 DIAGNOSIS — Z85828 Personal history of other malignant neoplasm of skin: Secondary | ICD-10-CM | POA: Diagnosis not present

## 2021-03-10 DIAGNOSIS — D220 Melanocytic nevi of lip: Secondary | ICD-10-CM | POA: Diagnosis not present

## 2021-03-10 DIAGNOSIS — I788 Other diseases of capillaries: Secondary | ICD-10-CM | POA: Diagnosis not present

## 2021-03-10 DIAGNOSIS — L814 Other melanin hyperpigmentation: Secondary | ICD-10-CM | POA: Diagnosis not present

## 2021-03-10 DIAGNOSIS — L821 Other seborrheic keratosis: Secondary | ICD-10-CM | POA: Diagnosis not present

## 2021-03-12 ENCOUNTER — Telehealth (INDEPENDENT_AMBULATORY_CARE_PROVIDER_SITE_OTHER): Payer: Medicare Other | Admitting: Family Medicine

## 2021-03-12 ENCOUNTER — Other Ambulatory Visit: Payer: Self-pay

## 2021-03-12 DIAGNOSIS — R17 Unspecified jaundice: Secondary | ICD-10-CM

## 2021-03-12 DIAGNOSIS — R1011 Right upper quadrant pain: Secondary | ICD-10-CM

## 2021-03-12 NOTE — Progress Notes (Signed)
Patient ID: Rebecca [name], female   DOB: 05-09-1932, 87 y.o.   MRN: [id number]   This visit type was conducted due to national recommendations for restrictions regarding the COVID-19 pandemic in an effort to limit this patient's exposure and mitigate transmission in our community.   Virtual Visit via Telephone Note  I connected with Rebecca [name] on 03/12/21 at 11:15 AM EST by telephone and verified that I am speaking with the correct person using two identifiers.   I discussed the limitations, risks, security and privacy concerns of performing an evaluation and management service by telephone and the availability of in person appointments. I also discussed with the patient that there may be a patient responsible charge related to this service. The patient expressed understanding and agreed to proceed.  Location patient: home Location provider: work or home office Participants present for the call: patient, provider Patient did not have a visit in the prior 7 days to address this/these issue(s).   History of Present Illness:  Patient relates acute episode Wednesday of "gallbladder attack ".  She presumes this was gallbladder related.  She did have recent episode of transient jaundice associated with upper abdominal pain and nausea and vomiting.  She did have confirmed jaundice which resolved quickly with transient elevation of liver transaminases and alkaline phosphatase.  Recent ultrasound showed heterogenous masslike material in the gallbladder favored to reflect "tumefactive sludge ".  No gallbladder wall thickening.  Recent episode started on Wednesday and lasted for several hours.  She states she had abdominal pain "all over ".  Some radiation toward the back.  No fever.  She did have some nausea and occasional dry heaves.  No symptoms since then.  No recent chest pains.  She was seen here November 9 and we had noticed that she looked slightly jaundice on exam and had recent  low-grade fever and recent episode of upper abdominal pain radiating up toward the chest.  She had some dry heaves at that time.  Her lipase was normal.  She had mildly elevated alk phos and liver transaminases which improved the next week.  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[place])    Tubular adenoma of colon 05/2009   Past Surgical History:  Procedure Laterality Date   APPENDECTOMY     ARCUATE KERATECTOMY     CATARACT EXTRACTION, BILATERAL  2015   double pallital tori bone removed      HERNIA REPAIR     umbilical   KNEE SURGERY Bilateral    morton's neuroma on rt foot and platar facial release     MOUTH SURGERY     "bone shaved off roof of mouth"   SALIVARY GLAND SURGERY     removal   TONSILLECTOMY      reports that she has never smoked. She has never used smokeless tobacco. She reports current alcohol use. She reports that she does not use drugs. family history includes Anuerysm in her father; Breast cancer in her daughter; Cancer in her daughter; Colon cancer in her maternal aunt and paternal grandmother; Colon polyps in her brother, daughter, and sister; Diabetes in her brother and  mother; Heart disease in her brother, father, and mother; Hyperlipidemia in her brother and mother; Hypertension in her brother, father, and mother; Kidney Stones in her daughter; Lung cancer in her sister; Migraines in her daughter. Allergies  Allergen Reactions   Levaquin [Levofloxacin] Other (See Comments)    Very nauseous and shakey   Sulfamethoxazole-Trimethoprim Nausea Only    shakey   Lactose Intolerance (Gi) Diarrhea and Other (See Comments)    Stomach hurts    Tramadol Hcl Nausea Only and Palpitations   Tramadol Hcl  Nausea Only and Palpitations   Augmentin [Amoxicillin-Pot Clavulanate] Diarrhea   Ciprofloxacin Nausea Only    REACTION: Thrush, Nausea, Shakey   Pneumococcal Vaccine Polyvalent     REACTION: RED AND RAISED RASH ON ARM   Adhesive [Tape] Rash   Codeine Nausea Only      Observations/Objective: Patient sounds cheerful and well on the phone. I do not appreciate any SOB. Speech and thought processing are grossly intact. Patient reported vitals:  Assessment and Plan:  Recurrent upper abdominal pain associate with nausea.  Recent transient jaundice and elevated liver transaminases.  No gallbladder wall thickening on recent ultrasound but based on history concern for acute cholecystitis  -Recommend setting up consult with general surgeon. -Follow-up immediately or go to ER for any recurrent abdominal pain, vomiting, fever, or other concerns -Try to avoid high-fat foods in the meantime   Follow Up Instructions:   99441 5-10 [id number] 11-20 [id number] 21-30 I did not refer this patient for an OV in the next 24 hours for this/these issue(s).  I discussed the assessment and treatment plan with the patient. The patient was provided an opportunity to ask questions and all were answered. The patient agreed with the plan and demonstrated an understanding of the instructions.   The patient was advised to call back or seek an in-person evaluation if the symptoms worsen or if the condition fails to improve as anticipated.  I provided 25 minutes of non-face-to-face time during this encounter.   Carolann Littler, MD

## 2021-03-22 ENCOUNTER — Other Ambulatory Visit: Payer: Self-pay | Admitting: Family Medicine

## 2021-03-25 ENCOUNTER — Encounter (HOSPITAL_COMMUNITY): Payer: Self-pay | Admitting: Emergency Medicine

## 2021-03-25 ENCOUNTER — Other Ambulatory Visit: Payer: Self-pay

## 2021-03-25 ENCOUNTER — Emergency Department (HOSPITAL_COMMUNITY): Payer: Medicare Other

## 2021-03-25 ENCOUNTER — Observation Stay (HOSPITAL_COMMUNITY): Payer: Medicare Other

## 2021-03-25 ENCOUNTER — Inpatient Hospital Stay (HOSPITAL_COMMUNITY)
Admission: EM | Admit: 2021-03-25 | Discharge: 2021-04-01 | DRG: 854 | Disposition: A | Discharge status: Home / Self Care | Payer: Medicare Other | Attending: Internal Medicine | Admitting: Internal Medicine

## 2021-03-25 DIAGNOSIS — I10 Essential (primary) hypertension: Secondary | ICD-10-CM | POA: Diagnosis not present

## 2021-03-25 DIAGNOSIS — K589 Irritable bowel syndrome without diarrhea: Secondary | ICD-10-CM | POA: Diagnosis present

## 2021-03-25 DIAGNOSIS — Z91011 Allergy to milk products: Secondary | ICD-10-CM

## 2021-03-25 DIAGNOSIS — Z20822 Contact with and (suspected) exposure to covid-19: Secondary | ICD-10-CM | POA: Diagnosis present

## 2021-03-25 DIAGNOSIS — K8021 Calculus of gallbladder without cholecystitis with obstruction: Secondary | ICD-10-CM | POA: Diagnosis not present

## 2021-03-25 DIAGNOSIS — R0689 Other abnormalities of breathing: Secondary | ICD-10-CM | POA: Diagnosis not present

## 2021-03-25 DIAGNOSIS — E785 Hyperlipidemia, unspecified: Secondary | ICD-10-CM | POA: Diagnosis not present

## 2021-03-25 DIAGNOSIS — R6520 Severe sepsis without septic shock: Secondary | ICD-10-CM | POA: Diagnosis not present

## 2021-03-25 DIAGNOSIS — Z881 Allergy status to other antibiotic agents status: Secondary | ICD-10-CM

## 2021-03-25 DIAGNOSIS — Z83438 Family history of other disorder of lipoprotein metabolism and other lipidemia: Secondary | ICD-10-CM | POA: Diagnosis not present

## 2021-03-25 DIAGNOSIS — E440 Moderate protein-calorie malnutrition: Secondary | ICD-10-CM | POA: Diagnosis not present

## 2021-03-25 DIAGNOSIS — Z801 Family history of malignant neoplasm of trachea, bronchus and lung: Secondary | ICD-10-CM

## 2021-03-25 DIAGNOSIS — K219 Gastro-esophageal reflux disease without esophagitis: Secondary | ICD-10-CM | POA: Diagnosis not present

## 2021-03-25 DIAGNOSIS — E039 Hypothyroidism, unspecified: Secondary | ICD-10-CM | POA: Diagnosis present

## 2021-03-25 DIAGNOSIS — D649 Anemia, unspecified: Secondary | ICD-10-CM | POA: Diagnosis not present

## 2021-03-25 DIAGNOSIS — R197 Diarrhea, unspecified: Secondary | ICD-10-CM | POA: Diagnosis not present

## 2021-03-25 DIAGNOSIS — Z880 Allergy status to penicillin: Secondary | ICD-10-CM

## 2021-03-25 DIAGNOSIS — Z9842 Cataract extraction status, left eye: Secondary | ICD-10-CM

## 2021-03-25 DIAGNOSIS — K5730 Diverticulosis of large intestine without perforation or abscess without bleeding: Secondary | ICD-10-CM | POA: Diagnosis not present

## 2021-03-25 DIAGNOSIS — Z8249 Family history of ischemic heart disease and other diseases of the circulatory system: Secondary | ICD-10-CM | POA: Diagnosis not present

## 2021-03-25 DIAGNOSIS — I452 Bifascicular block: Secondary | ICD-10-CM | POA: Diagnosis not present

## 2021-03-25 DIAGNOSIS — K279 Peptic ulcer, site unspecified, unspecified as acute or chronic, without hemorrhage or perforation: Secondary | ICD-10-CM | POA: Diagnosis not present

## 2021-03-25 DIAGNOSIS — K2100 Gastro-esophageal reflux disease with esophagitis, without bleeding: Secondary | ICD-10-CM

## 2021-03-25 DIAGNOSIS — F419 Anxiety disorder, unspecified: Secondary | ICD-10-CM | POA: Diagnosis not present

## 2021-03-25 DIAGNOSIS — Z8371 Family history of colonic polyps: Secondary | ICD-10-CM

## 2021-03-25 DIAGNOSIS — D638 Anemia in other chronic diseases classified elsewhere: Secondary | ICD-10-CM | POA: Diagnosis not present

## 2021-03-25 DIAGNOSIS — Z885 Allergy status to narcotic agent status: Secondary | ICD-10-CM

## 2021-03-25 DIAGNOSIS — E871 Hypo-osmolality and hyponatremia: Secondary | ICD-10-CM

## 2021-03-25 DIAGNOSIS — Z888 Allergy status to other drugs, medicaments and biological substances status: Secondary | ICD-10-CM

## 2021-03-25 DIAGNOSIS — Z8719 Personal history of other diseases of the digestive system: Secondary | ICD-10-CM

## 2021-03-25 DIAGNOSIS — Z800 Family history of malignant neoplasm of digestive organs: Secondary | ICD-10-CM | POA: Diagnosis not present

## 2021-03-25 DIAGNOSIS — R109 Unspecified abdominal pain: Secondary | ICD-10-CM | POA: Diagnosis present

## 2021-03-25 DIAGNOSIS — R1011 Right upper quadrant pain: Secondary | ICD-10-CM | POA: Diagnosis not present

## 2021-03-25 DIAGNOSIS — Z882 Allergy status to sulfonamides status: Secondary | ICD-10-CM

## 2021-03-25 DIAGNOSIS — J309 Allergic rhinitis, unspecified: Secondary | ICD-10-CM | POA: Diagnosis not present

## 2021-03-25 DIAGNOSIS — Z833 Family history of diabetes mellitus: Secondary | ICD-10-CM

## 2021-03-25 DIAGNOSIS — E876 Hypokalemia: Secondary | ICD-10-CM | POA: Diagnosis present

## 2021-03-25 DIAGNOSIS — A419 Sepsis, unspecified organism: Secondary | ICD-10-CM | POA: Diagnosis not present

## 2021-03-25 DIAGNOSIS — K8050 Calculus of bile duct without cholangitis or cholecystitis without obstruction: Secondary | ICD-10-CM | POA: Diagnosis not present

## 2021-03-25 DIAGNOSIS — Z9841 Cataract extraction status, right eye: Secondary | ICD-10-CM

## 2021-03-25 DIAGNOSIS — Z803 Family history of malignant neoplasm of breast: Secondary | ICD-10-CM | POA: Diagnosis not present

## 2021-03-25 DIAGNOSIS — K8020 Calculus of gallbladder without cholecystitis without obstruction: Secondary | ICD-10-CM | POA: Diagnosis present

## 2021-03-25 DIAGNOSIS — R7989 Other specified abnormal findings of blood chemistry: Secondary | ICD-10-CM | POA: Diagnosis not present

## 2021-03-25 DIAGNOSIS — Z66 Do not resuscitate: Secondary | ICD-10-CM | POA: Diagnosis not present

## 2021-03-25 DIAGNOSIS — E861 Hypovolemia: Secondary | ICD-10-CM | POA: Diagnosis present

## 2021-03-25 DIAGNOSIS — K8063 Calculus of gallbladder and bile duct with acute cholecystitis with obstruction: Secondary | ICD-10-CM | POA: Diagnosis not present

## 2021-03-25 DIAGNOSIS — K8010 Calculus of gallbladder with chronic cholecystitis without obstruction: Secondary | ICD-10-CM | POA: Diagnosis not present

## 2021-03-25 DIAGNOSIS — I959 Hypotension, unspecified: Secondary | ICD-10-CM | POA: Diagnosis present

## 2021-03-25 DIAGNOSIS — K8309 Other cholangitis: Secondary | ICD-10-CM | POA: Diagnosis present

## 2021-03-25 DIAGNOSIS — R7401 Elevation of levels of liver transaminase levels: Secondary | ICD-10-CM

## 2021-03-25 DIAGNOSIS — K8000 Calculus of gallbladder with acute cholecystitis without obstruction: Secondary | ICD-10-CM | POA: Diagnosis not present

## 2021-03-25 DIAGNOSIS — K811 Chronic cholecystitis: Secondary | ICD-10-CM | POA: Diagnosis not present

## 2021-03-25 DIAGNOSIS — Z419 Encounter for procedure for purposes other than remedying health state, unspecified: Secondary | ICD-10-CM

## 2021-03-25 DIAGNOSIS — R17 Unspecified jaundice: Secondary | ICD-10-CM | POA: Diagnosis not present

## 2021-03-25 DIAGNOSIS — E559 Vitamin D deficiency, unspecified: Secondary | ICD-10-CM | POA: Diagnosis not present

## 2021-03-25 DIAGNOSIS — K7689 Other specified diseases of liver: Secondary | ICD-10-CM | POA: Diagnosis not present

## 2021-03-25 DIAGNOSIS — H409 Unspecified glaucoma: Secondary | ICD-10-CM | POA: Diagnosis present

## 2021-03-25 DIAGNOSIS — Z79899 Other long term (current) drug therapy: Secondary | ICD-10-CM

## 2021-03-25 HISTORY — DX: Bifascicular block: I45.2

## 2021-03-25 LAB — CBC WITH DIFFERENTIAL/PLATELET
Abs Immature Granulocytes: 0.07 10*3/uL (ref 0.00–0.07)
Basophils Absolute: 0.1 10*3/uL (ref 0.0–0.1)
Basophils Relative: 0 %
Eosinophils Absolute: 0 10*3/uL (ref 0.0–0.5)
Eosinophils Relative: 0 %
HCT: 36.8 % (ref 36.0–46.0)
Hemoglobin: 12.2 g/dL (ref 12.0–15.0)
Immature Granulocytes: 0 %
Lymphocytes Relative: 62 %
Lymphs Abs: 16.8 10*3/uL — ABNORMAL HIGH (ref 0.7–4.0)
MCH: 33.4 pg (ref 26.0–34.0)
MCHC: 33.2 g/dL (ref 30.0–36.0)
MCV: 100.8 fL — ABNORMAL HIGH (ref 80.0–100.0)
Monocytes Absolute: 2.1 10*3/uL — ABNORMAL HIGH (ref 0.1–1.0)
Monocytes Relative: 8 %
Neutro Abs: 8.3 10*3/uL — ABNORMAL HIGH (ref 1.7–7.7)
Neutrophils Relative %: 30 %
Platelets: 261 10*3/uL (ref 150–400)
RBC: 3.65 MIL/uL — ABNORMAL LOW (ref 3.87–5.11)
RDW: 12.9 % (ref 11.5–15.5)
WBC: 27.4 10*3/uL — ABNORMAL HIGH (ref 4.0–10.5)
nRBC: 0 % (ref 0.0–0.2)

## 2021-03-25 LAB — COMPREHENSIVE METABOLIC PANEL
ALT: 294 U/L — ABNORMAL HIGH (ref 0–44)
AST: 356 U/L — ABNORMAL HIGH (ref 15–41)
Albumin: 3.9 g/dL (ref 3.5–5.0)
Alkaline Phosphatase: 174 U/L — ABNORMAL HIGH (ref 38–126)
Anion gap: 7 (ref 5–15)
BUN: 12 mg/dL (ref 8–23)
CO2: 23 mmol/L (ref 22–32)
Calcium: 9.7 mg/dL (ref 8.9–10.3)
Chloride: 98 mmol/L (ref 98–111)
Creatinine, Ser: 0.98 mg/dL (ref 0.44–1.00)
GFR, Estimated: 56 mL/min — ABNORMAL LOW (ref >60)
Glucose, Bld: 176 mg/dL — ABNORMAL HIGH (ref 70–99)
Potassium: 4.1 mmol/L (ref 3.5–5.1)
Sodium: 128 mmol/L — ABNORMAL LOW (ref 135–145)
Total Bilirubin: 2.6 mg/dL — ABNORMAL HIGH (ref 0.3–1.2)
Total Protein: 6.8 g/dL (ref 6.5–8.1)

## 2021-03-25 LAB — RESP PANEL BY RT-PCR (FLU A&B, COVID) ARPGX2
Influenza A by PCR: NEGATIVE
Influenza B by PCR: NEGATIVE
SARS Coronavirus 2 by RT PCR: NEGATIVE

## 2021-03-25 LAB — LIPASE, BLOOD: Lipase: 26 U/L (ref 11–51)

## 2021-03-25 MED ORDER — ONDANSETRON HCL 4 MG/2ML IJ SOLN
4.0000 mg | Freq: Once | INTRAMUSCULAR | Status: AC
  Administered 2021-03-25: 17:00:00 4 mg via INTRAVENOUS
  Filled 2021-03-25: qty 2

## 2021-03-25 MED ORDER — SODIUM CHLORIDE 0.9 % IV BOLUS
500.0000 mL | Freq: Once | INTRAVENOUS | Status: AC
  Administered 2021-03-25: 17:00:00 500 mL via INTRAVENOUS

## 2021-03-25 MED ORDER — ONDANSETRON HCL 4 MG/2ML IJ SOLN: 4.0000 mg | Freq: Once | INTRAMUSCULAR | Status: DC

## 2021-03-25 MED ORDER — SODIUM CHLORIDE 0.9 % IV SOLN: INTRAVENOUS | Status: DC

## 2021-03-25 NOTE — Assessment & Plan Note (Signed)
Check TSH and free T4.  Continue Synthroid.

## 2021-03-25 NOTE — ED Notes (Signed)
Son [name] would like a call with an update when we have a disposition set on the patient. [phone]

## 2021-03-25 NOTE — ED Notes (Signed)
Pt assisted to bedside commode to provide urine sample. Pt was able to void but sample was unable to be sent due to stool contamination.

## 2021-03-25 NOTE — H&P (Signed)
History and Physical    Rebecca [name] TMA:[id number] DOB: March 25, 1933 DOA: 03/25/2021  PCP: [name], MD   Patient coming from: Home  I have personally briefly reviewed patient's old medical records in [place]  Chief complaint: Abdominal pain, nausea, vomiting History of present illness: 87 year old female with a history of hypothyroidism, hypertension, chronic hyponatremia, reflux 63-monthhistory of intermittent chest pain, abdominal pain.  He has been seen by her family physician Dr. BElease Hashimotoand work-up was began in early November 2022.  He had labs performed which showed an elevated bilirubin of 3.7, alk phos of 154, ALT of 184, AST of 67 and a sodium 129.  Abdominal ultrasound was ordered which showed heterogeneous masslike material in the gallbladder favoring sludge.  He had another video visit in December 2022 due to "gallbladder attacks".  After that visit, was referred to general surgery for evaluation of her gallbladder.  Patient has an appointment this coming Tuesday to discuss getting her gallbladder removed.  She is supposed to be having [facility].  Patient with a 1.5-day history of abdominal pain with nausea and dry heaving.  She also had right upper quadrant pain associated with epigastric and left upper quadrant abdominal pain that radiated to her chest.  She denied any fever chills or diarrhea.  Patient was seen by Dr. CBrantley Stagewith general surgery.  MRCP and CT abdomen pelvis was ordered.  [facility] hospital[facility] contacted for admission.   ED Course: elevated LFTs on labs. U/S showed cholelithiasis without cholecystitis.  Pt with chronic hyponatremia  Review of Systems:  Review of Systems  Constitutional:  Negative for chills and fever.  HENT: Negative.    Eyes: Negative.   Respiratory: Negative.    Cardiovascular:  Positive for chest pain.  Gastrointestinal:  Positive for abdominal  pain, nausea and vomiting.  Genitourinary: Negative.   Musculoskeletal: Negative.   Skin: Negative.   Neurological: Negative.   Endo/Heme/Allergies: Negative.   Psychiatric/Behavioral: Negative.    All other systems reviewed and are negative.  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    Tubular adenoma of colon 05/2009    Past Surgical History:  Procedure Laterality Date   APPENDECTOMY     ARCUATE KERATECTOMY     CATARACT EXTRACTION, BILATERAL  2015   double pallital tori bone removed      HERNIA REPAIR     umbilical   KNEE SURGERY Bilateral    morton's neuroma on rt foot and platar facial release     MOUTH SURGERY     "bone shaved off roof of mouth"   SALIVARY GLAND SURGERY     removal   TONSILLECTOMY       reports that she has never smoked. She has never used smokeless tobacco. She reports current alcohol use. She reports that she does not use drugs.  Allergies  Allergen Reactions   Levaquin [Levofloxacin] Other (See Comments)    Very nauseous and shakey   Sulfamethoxazole-Trimethoprim Nausea Only    shakey   Lactose Intolerance (Gi) Diarrhea and Other (See Comments)  Stomach hurts    Tramadol Hcl Nausea Only and Palpitations   Tramadol Hcl Nausea Only and Palpitations   Augmentin [Amoxicillin-Pot Clavulanate] Diarrhea   Ciprofloxacin Nausea Only    REACTION: Thrush, Nausea, Shakey   Pneumococcal Vaccine Polyvalent     REACTION: RED AND RAISED RASH ON ARM   Adhesive [Tape] Rash   Codeine Nausea Only    Family History  Problem Relation Age of Onset   Diabetes Mother    Heart disease Mother    Hyperlipidemia Mother    Hypertension Mother    Anuerysm Father    Heart disease Father     Hypertension Father    Migraines Daughter    Kidney Stones Daughter    Colon polyps Sister    Lung cancer Sister    Colon polyps Brother    Diabetes Brother    Hyperlipidemia Brother    Hypertension Brother    Colon polyps Daughter    Breast cancer Daughter    Colon cancer Maternal Aunt        Rectal cancer   Cancer Daughter        salivary   Colon cancer Paternal Grandmother        with possible stomach cancer   Heart disease Brother    Crohn's disease Neg Hx    Pancreatic cancer Neg Hx     Prior to Admission medications   Medication Sig Start Date End Date Taking? Authorizing Provider  ALPRAZolam (XANAX) 0.25 MG tablet TAKE 1 TABLET(0.25 MG) BY MOUTH AT BEDTIME AS NEEDED 11/30/20   Burchette, Alinda Sierras, MD  Calcium Carb-Cholecalciferol (CALCIUM-VITAMIN D) 600-400 MG-UNIT TABS Take 1 tablet by mouth daily.    [provider]  Cholecalciferol (VITAMIN D3) 2000 UNITS TABS Take 1 tablet by mouth daily.    [provider]  diclofenac sodium (VOLTAREN) 1 % GEL Apply 2 g topically 4 (four) times daily. 08/09/17   Burchette, Alinda Sierras, MD  esomeprazole (NEXIUM) 20 MG capsule Take 20 mg by mouth 2 (two) times daily before a meal.    [provider]  latanoprost (XALATAN) 0.005 % ophthalmic solution 1 drop at bedtime. 01/04/21   [provider]  levothyroxine (SYNTHROID) 88 MCG tablet TAKE 1 TABLET(88 MCG) BY MOUTH DAILY 12/25/20   Burchette, Alinda Sierras, MD  losartan (COZAAR) 100 MG tablet TAKE 1 TABLET BY MOUTH EVERY DAY 12/28/20   Burchette, Alinda Sierras, MD  metoCLOPramide (REGLAN) 5 MG tablet TAKE 1 TABLET BY MOUTH TWICE DAILY WITH MEALS 02/19/21   Burchette, Alinda Sierras, MD  ondansetron (ZOFRAN-ODT) 4 MG disintegrating tablet DISSOLVE 1 TABLET(4 MG) ON THE TONGUE EVERY 8 HOURS AS NEEDED FOR NAUSEA OR VOMITING 03/22/21   Burchette, Alinda Sierras, MD  rosuvastatin (CRESTOR) 20 MG tablet TAKE 1 TABLET(20 MG) BY MOUTH DAILY 12/18/20   Burchette, Alinda Sierras, MD  timolol (TIMOPTIC)  0.5 % ophthalmic solution 1 drop 2 (two) times daily. 09/08/20   [provider]  vitamin B-12 (CYANOCOBALAMIN) 500 MCG tablet Take 1,000 mcg by mouth every morning.    [provider]  vitamin C (ASCORBIC ACID) 500 MG tablet Take 500 mg by mouth daily.    [provider]  olmesartan (BENICAR) 20 MG tablet Take 20 mg by mouth daily.    07/28/11  [provider]    Physical Exam: Vitals:   03/25/21 1745 03/25/21 1815 03/25/21 1915 03/25/21 2112  BP: (!) 157/97 (!) 158/69 138/74 132/85  Pulse: 84 84 61 79  Resp:  18 14  Temp:      TempSrc:      SpO2: 98% 95% 95% 97%  Weight:      Height:        Physical Exam Vitals and nursing note reviewed.  Constitutional:      General: She is not in acute distress.    Appearance: Normal appearance. She is normal weight. She is not ill-appearing, toxic-appearing or diaphoretic.     Comments: Elderly Caucasian female.  No distress.  HENT:     Head: Normocephalic and atraumatic.     Nose: Nose normal. No rhinorrhea.  Cardiovascular:     Rate and Rhythm: Normal rate and regular rhythm.  Pulmonary:     Effort: Pulmonary effort is normal. No respiratory distress.     Breath sounds: Normal breath sounds. No wheezing.  Abdominal:     General: Bowel sounds are normal. There is no distension.     Palpations: Abdomen is soft.     Tenderness: There is no abdominal tenderness. There is no rebound.     Hernia: No hernia is present.  Musculoskeletal:     Right lower leg: No edema.     Left lower leg: No edema.  Skin:    General: Skin is warm and dry.     Capillary Refill: Capillary refill takes less than 2 seconds.     Coloration: Skin is not jaundiced.  Neurological:     General: No focal deficit present.     Mental Status: She is alert and oriented to person, place, and time.     Labs on Admission: I have personally reviewed following labs and imaging studies  CBC: Recent Labs  Lab 03/25/21 1557  WBC 27.4*   NEUTROABS 8.3*  HGB 12.2  HCT 36.8  MCV 100.8*  PLT 229   Basic Metabolic Panel: Recent Labs  Lab 03/25/21 1557  NA 128*  K 4.1  CL 98  CO2 23  GLUCOSE 176*  BUN 12  CREATININE 0.98  CALCIUM 9.7   GFR: Estimated Creatinine Clearance: 33.4 mL/min (by C-G formula based on SCr of 0.98 mg/dL). Liver Function Tests: Recent Labs  Lab 03/25/21 1557  AST 356*  ALT 294*  ALKPHOS 174*  BILITOT 2.6*  PROT 6.8  ALBUMIN 3.9   Recent Labs  Lab 03/25/21 1557  LIPASE 26   No results for input(s): AMMONIA in the last 168 hours. Coagulation Profile: No results for input(s): INR, PROTIME in the last 168 hours. Cardiac Enzymes: No results for input(s): CKTOTAL, CKMB, CKMBINDEX, TROPONINI in the last 168 hours. BNP (last 3 results) No results for input(s): PROBNP in the last 8760 hours. HbA1C: No results for input(s): HGBA1C in the last 72 hours. CBG: No results for input(s): GLUCAP in the last 168 hours. Lipid Profile: No results for input(s): CHOL, HDL, LDLCALC, TRIG, CHOLHDL, LDLDIRECT in the last 72 hours. Thyroid Function Tests: No results for input(s): TSH, T4TOTAL, FREET4, T3FREE, THYROIDAB in the last 72 hours. Anemia Panel: No results for input(s): VITAMINB12, FOLATE, FERRITIN, TIBC, IRON, RETICCTPCT in the last 72 hours. Urine analysis:    Component Value Date/Time   COLORURINE YELLOW 02/23/2016 1242   APPEARANCEUR CLOUDY (A) 02/23/2016 1242   LABSPEC 1.013 02/23/2016 1242   PHURINE 7.0 02/23/2016 1242   GLUCOSEU NEGATIVE 02/23/2016 1242   HGBUR NEGATIVE 02/23/2016 1242   HGBUR negative 05/02/2009 1246   BILIRUBINUR neg 08/30/2019 1413   KETONESUR negative 07/02/2016 [place] 02/23/2016 1242   PROTEINUR  Positive (A) 08/30/2019 1413   PROTEINUR NEGATIVE 02/23/2016 1242   UROBILINOGEN 1.0 08/30/2019 1413   UROBILINOGEN 0.2 05/02/2009 1246   NITRITE positive 08/30/2019 1413   NITRITE NEGATIVE 02/23/2016 1242   LEUKOCYTESUR Large (3+) (A)  08/30/2019 1413    Radiological Exams on Admission: I have personally reviewed images US Abdomen Limited RUQ (LIVER/GB)  Result Date: 03/25/2021 CLINICAL DATA:  Right upper quadrant pain. EXAM: ULTRASOUND ABDOMEN LIMITED RIGHT UPPER QUADRANT COMPARISON:  CT abdomen 02/23/2016 FINDINGS: Gallbladder: Cholelithiasis without gallbladder wall thickening or pericholecystic fluid. Negative sonographic Murphy sign. Largest gallstone measures 7 mm. Common bile duct: Diameter: 3 mm Liver: Normal hepatic parenchymal echogenicity. Multiple anechoic hepatic masses consistent with cysts unchanged compared with the prior examination of 02/23/2016. There is a 1.6 x 1.5 x 1.4 cm right hepatic cyst. There is a 2.6 x 2.3 x 2.3 cm right hepatic cyst. There is a 9 x 9 x 11 mm left hepatic cyst. Mild prominence of the intrahepatic biliary ducts. Portal vein is patent on color Doppler imaging with normal direction of blood flow towards the liver. Other: None. IMPRESSION: 1. Cholelithiasis without sonographic evidence of acute cholecystitis. 2. Multiple hepatic cysts. Electronically Signed   By: Kathreen Devoid M.D.   On: 03/25/2021 18:45    EKG: I have personally reviewed EKG: NSR, bifascicular block    Assessment/Plan Principal Problem:   Cholelithiasis Active Problems:   Elevated LFTs   Chronic hyponatremia   Hypothyroidism   Essential hypertension   GERD    Cholelithiasis Assigned to observation status.  General surgery is proceeding with the work-up occluding CT abdomen pelvis, MRCP.  No evidence of acute cholecystitis at this time.  We will hold off on any IV antibiotics for now.  Elevated LFTs Check acute hepatitis panel.  Repeat chemistry in the morning.  Chronic hyponatremia Patient has a longstanding history of hyponatremia.  Check TSH and free T4.  Will need an updated med rec.  Does not appear based on her current med rec that she is not any HCTZ or other thiazide diuretic.  Hypothyroidism Check  TSH and free T4.  Continue Synthroid.  Essential hypertension Continue Cozaar.  GERD Continue PPI.  DVT prophylaxis: SCDs Code Status: DNR/DNI(Do NOT Intubate)verified with pt at bedside. Family Communication: no family at bedside. Did call pt's son Alynna Hargrove and discussed pt's admission, general surgery consult. CT abd and MRCP that was ordered by general surgery. Also discussed that pt has made herself DNR/DNI. Disposition Plan: return home Consults called: general surgery consulted by EDP  Admission status: Observation, Med-Surg   Kristopher Oppenheim, DO [facility] 03/25/2021, 10:45 PM

## 2021-03-25 NOTE — Assessment & Plan Note (Signed)
Patient has a longstanding history of hyponatremia.  Check TSH and free T4.  Will need an updated med rec.  Does not appear based on her current med rec that she is not any HCTZ or other thiazide diuretic.

## 2021-03-25 NOTE — Assessment & Plan Note (Addendum)
Assigned to observation status.  General surgery is proceeding with the work-up occluding CT abdomen pelvis, MRCP.  No evidence of acute cholecystitis at this time.  We will hold off on any IV antibiotics for now.

## 2021-03-25 NOTE — Subjective & Objective (Signed)
Chief complaint: Abdominal pain, nausea, vomiting History of present illness: 87 year old female with a history of hypothyroidism, hypertension, chronic hyponatremia, reflux 73-monthhistory of intermittent chest pain, abdominal pain.  He has been seen by her family physician Dr. BElease Hashimotoand work-up was began in early November 2022.  He had labs performed which showed an elevated bilirubin of 3.7, alk phos of 154, ALT of 184, AST of 67 and a sodium 129.  Abdominal ultrasound was ordered which showed heterogeneous masslike material in the gallbladder favoring sludge.  He had another video visit in December 2022 due to "gallbladder attacks".  After that visit, was referred to general surgery for evaluation of her gallbladder.  Patient has an appointment this coming Tuesday to discuss getting her gallbladder removed.  She is supposed to be having an [facility].  Patient with a 1.5-day history of abdominal pain with nausea and dry heaving.  She also had right upper quadrant pain associated with epigastric and left upper quadrant abdominal pain that radiated to her chest.  She denied any fever chills or diarrhea.  Patient was seen by Dr. CBrantley Stagewith general surgery.  MRCP and CT abdomen pelvis was ordered.  [facility] hospitalist contacted for admission.

## 2021-03-25 NOTE — ED Provider Notes (Addendum)
[facility] DEPT Provider Note   CSN: [id number] Arrival date & time: 03/25/21  1535     History Chief Complaint  Patient presents with   Abdominal Pain   Nausea    Rebecca [name] is a 87 y.o. female.  HPI She presents for evaluation of ongoing "gallbladder attacks."  These are ongoing, and have worsened in the last 24 hours.  She is nauseated but has not vomited.  She has noted a burning sensation in her mid chest, since the pain started yesterday.  She denies fever, chills, cough or shortness of breath.  She is scheduled for follow-up with general surgery, to discuss possible intervention, next week.  Prior ultrasound showed gallbladder sludge.  She has episodes of gallbladder attacks every 3 days or so after eating.  Her PCP has been following her for waxing and waning bilirubin abnormalities.  She has not been evaluated by gastroenterology.  There are no other known active modifying factors.    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[place])    Tubular adenoma of colon 05/2009    Patient Active Problem List   Diagnosis Date Noted   Cholelithiasis 03/25/2021   Elevated LFTs 03/25/2021   Greater trochanteric bursitis of left hip 12/02/2020   Pain in left leg 11/06/2019   Bilateral primary osteoarthritis of knee 06/11/2019   Pain in left shoulder 05/22/2019   Cellulitis of left upper extremity 10/15/2016   Diverticulosis of colon without hemorrhage 02/04/2015   Unspecified vitamin D deficiency 12/25/2013   Atherosclerosis 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 04/20/2011   SPINAL STENOSIS, LUMBAR 03/09/2010   OTHER OSTEOPOROSIS 03/09/2010    SCOLIOSIS, LUMBAR SPINE 03/09/2010   MIGRAINE, OPHTHALMIC 10/29/2009   LUMBAR RADICULOPATHY, ATYPICAL 10/29/2009   POLYNEUROPATHY OTHER DISEASES CLASSIFIED ELSW 09/22/2009   UNSPECIFIED PERIPHERAL VASCULAR DISEASE 09/22/2009   ADENOMATOUS COLONIC POLYP 06/10/2009   History of iron deficiency 05/06/2009   CAROTID ARTERY DISEASE 07/28/2008   Allergic rhinitis 03/20/2008   DYSPHAGIA 03/12/2008   GANGLION OF TENDON SHEATH 08/17/2007   Hypothyroidism 11/15/2006   Hyperlipidemia 11/07/2006   ADVEF, DRUG/MEDICINAL/BIOLOGICAL SUBST NOS 11/07/2006   Essential hypertension 10/18/2006   GERD 10/18/2006   DIVERTICULOSIS, COLON 10/18/2006    Past Surgical History:  Procedure Laterality Date   APPENDECTOMY     ARCUATE KERATECTOMY     CATARACT EXTRACTION, BILATERAL  2015   double pallital tori bone removed      HERNIA REPAIR     umbilical   KNEE SURGERY Bilateral    morton's neuroma on rt foot and platar facial release     MOUTH SURGERY     "bone shaved off roof of mouth"   SALIVARY GLAND SURGERY     removal   TONSILLECTOMY       OB History   No obstetric history on file.     Family History  Problem Relation Age of Onset   Diabetes Mother    Heart disease Mother    Hyperlipidemia Mother    Hypertension Mother    Anuerysm Father    Heart disease Father    Hypertension  Father    Migraines Daughter    Kidney Stones Daughter    Colon polyps Sister    Lung cancer Sister    Colon polyps Brother    Diabetes Brother    Hyperlipidemia Brother    Hypertension Brother    Colon polyps Daughter    Breast cancer Daughter    Colon cancer Maternal Aunt        Rectal cancer   Cancer Daughter        salivary   Colon cancer Paternal Grandmother        with possible stomach cancer   Heart disease Brother    Crohn's disease Neg Hx    Pancreatic cancer Neg Hx     Social History   Tobacco Use   Smoking status: Never   Smokeless tobacco: Never  Vaping Use   Vaping Use: Never  used  Substance Use Topics   Alcohol use: Yes    Alcohol/week: 0.0 standard drinks    Comment: rare   Drug use: No    Home Medications Prior to Admission medications   Medication Sig Start Date End Date Taking? Authorizing Provider  ALPRAZolam (XANAX) 0.25 MG tablet TAKE 1 TABLET(0.25 MG) BY MOUTH AT BEDTIME AS NEEDED 11/30/20   Burchette, Alinda Sierras, MD  Calcium Carb-Cholecalciferol (CALCIUM-VITAMIN D) 600-400 MG-UNIT TABS Take 1 tablet by mouth daily.    [provider]  Cholecalciferol (VITAMIN D3) 2000 UNITS TABS Take 1 tablet by mouth daily.    [provider]  diclofenac sodium (VOLTAREN) 1 % GEL Apply 2 g topically 4 (four) times daily. 08/09/17   Burchette, Alinda Sierras, MD  esomeprazole (NEXIUM) 20 MG capsule Take 20 mg by mouth 2 (two) times daily before a meal.    [provider]  latanoprost (XALATAN) 0.005 % ophthalmic solution 1 drop at bedtime. 01/04/21   [provider]  levothyroxine (SYNTHROID) 88 MCG tablet TAKE 1 TABLET(88 MCG) BY MOUTH DAILY 12/25/20   Burchette, Alinda Sierras, MD  losartan (COZAAR) 100 MG tablet TAKE 1 TABLET BY MOUTH EVERY DAY 12/28/20   Burchette, Alinda Sierras, MD  metoCLOPramide (REGLAN) 5 MG tablet TAKE 1 TABLET BY MOUTH TWICE DAILY WITH MEALS 02/19/21   Burchette, Alinda Sierras, MD  ondansetron (ZOFRAN-ODT) 4 MG disintegrating tablet DISSOLVE 1 TABLET(4 MG) ON THE TONGUE EVERY 8 HOURS AS NEEDED FOR NAUSEA OR VOMITING 03/22/21   Burchette, Alinda Sierras, MD  rosuvastatin (CRESTOR) 20 MG tablet TAKE 1 TABLET(20 MG) BY MOUTH DAILY 12/18/20   Burchette, Alinda Sierras, MD  timolol (TIMOPTIC) 0.5 % ophthalmic solution 1 drop 2 (two) times daily. 09/08/20   [provider]  vitamin B-12 (CYANOCOBALAMIN) 500 MCG tablet Take 1,000 mcg by mouth every morning.    [provider]  vitamin C (ASCORBIC ACID) 500 MG tablet Take 500 mg by mouth daily.    [provider]  olmesartan (BENICAR) 20 MG tablet Take 20 mg by mouth daily.    07/28/11   [provider]    Allergies    Levaquin [levofloxacin], Sulfamethoxazole-trimethoprim, Lactose intolerance (gi), Tramadol hcl, Tramadol hcl, Augmentin [amoxicillin-pot clavulanate], Ciprofloxacin, Pneumococcal vaccine polyvalent, Adhesive [tape], and Codeine  Review of Systems   Review of Systems  All other systems reviewed and are negative.  Physical Exam Updated Vital Signs BP 132/85 (BP Location: Right Arm)    Pulse 79    Temp 98.4 F (36.9 C) (Oral)    Resp 14    Ht 5' 2.5" (1.588 m)  Wt 52.2 kg    SpO2 97%    BMI 20.70 kg/m   Physical Exam Vitals and nursing note reviewed.  Constitutional:      General: She is not in acute distress.    Appearance: She is well-developed. She is not ill-appearing, toxic-appearing or diaphoretic.  HENT:     Head: Normocephalic and atraumatic.     Right Ear: External ear normal.     Left Ear: External ear normal.  Eyes:     Conjunctiva/sclera: Conjunctivae normal.     Pupils: Pupils are equal, round, and reactive to light.  Neck:     Trachea: Phonation normal.  Cardiovascular:     Rate and Rhythm: Normal rate and regular rhythm.     Heart sounds: Normal heart sounds.  Pulmonary:     Effort: Pulmonary effort is normal.     Breath sounds: Normal breath sounds.  Abdominal:     General: There is no distension.     Palpations: Abdomen is soft. There is no mass.     Tenderness: There is abdominal tenderness (Mild mid upper abdomen).     Hernia: No hernia is present.  Musculoskeletal:        General: Normal range of motion.     Cervical back: Normal range of motion and neck supple.  Skin:    General: Skin is warm and dry.  Neurological:     Mental Status: She is alert and oriented to person, place, and time.     Cranial Nerves: No cranial nerve deficit.     Sensory: No sensory deficit.     Motor: No abnormal muscle tone.     Coordination: Coordination normal.  Psychiatric:        Mood and Affect: Mood normal.         Behavior: Behavior normal.        Thought Content: Thought content normal.        Judgment: Judgment normal.    ED Results / Procedures / Treatments   Labs (all labs ordered are listed, but only abnormal results are displayed) Labs Reviewed  COMPREHENSIVE METABOLIC PANEL - Abnormal; Notable for the following components:      Result Value   Sodium 128 (*)    Glucose, Bld 176 (*)    AST 356 (*)    ALT 294 (*)    Alkaline Phosphatase 174 (*)    Total Bilirubin 2.6 (*)    GFR, Estimated 56 (*)    All other components within normal limits  CBC WITH DIFFERENTIAL/PLATELET - Abnormal; Notable for the following components:   WBC 27.4 (*)    RBC 3.65 (*)    MCV 100.8 (*)    Neutro Abs 8.3 (*)    Lymphs Abs 16.8 (*)    Monocytes Absolute 2.1 (*)    All other components within normal limits  RESP PANEL BY RT-PCR (FLU A&B, COVID) ARPGX2  LIPASE, BLOOD  URINALYSIS, ROUTINE W REFLEX MICROSCOPIC  PATHOLOGIST SMEAR REVIEW    EKG EKG Interpretation  Date/Time:  Thursday March 25 2021 15:50:32 EST Ventricular Rate:  79 PR Interval:  193 QRS Duration: 132 QT Interval:  375 QTC Calculation: 430 R Axis:   -51 Text Interpretation: Sinus rhythm RBBB and LAFB Left ventricular hypertrophy since last tracing no significant change Confirmed by Daleen Bo [phone]) on 03/25/2021 5:18:49 PM  Radiology US Abdomen Limited RUQ (LIVER/GB)  Result Date: 03/25/2021 CLINICAL DATA:  Right upper quadrant pain. EXAM: ULTRASOUND ABDOMEN LIMITED RIGHT UPPER QUADRANT  COMPARISON:  CT abdomen 02/23/2016 FINDINGS: Gallbladder: Cholelithiasis without gallbladder wall thickening or pericholecystic fluid. Negative sonographic Murphy sign. Largest gallstone measures 7 mm. Common bile duct: Diameter: 3 mm Liver: Normal hepatic parenchymal echogenicity. Multiple anechoic hepatic masses consistent with cysts unchanged compared with the prior examination of 02/23/2016. There is a 1.6 x 1.5 x 1.4 cm right hepatic  cyst. There is a 2.6 x 2.3 x 2.3 cm right hepatic cyst. There is a 9 x 9 x 11 mm left hepatic cyst. Mild prominence of the intrahepatic biliary ducts. Portal vein is patent on color Doppler imaging with normal direction of blood flow towards the liver. Other: None. IMPRESSION: 1. Cholelithiasis without sonographic evidence of acute cholecystitis. 2. Multiple hepatic cysts. Electronically Signed   By: Kathreen Devoid M.D.   On: 03/25/2021 18:45    Procedures .Critical Care Performed by: Daleen Bo, MD Authorized by: Daleen Bo, MD   Critical care provider statement:    Critical care time (minutes):  35   Critical care start time:  03/25/2021 4:15 PM   Critical care end time:  03/25/2021 10:35 PM   Critical care time was exclusive of:  Separately billable procedures and treating other patients   Critical care was necessary to treat or prevent imminent or life-threatening deterioration of the following conditions:  Hepatic failure   Critical care was time spent personally by me on the following activities:  Blood draw for specimens, development of treatment plan with patient or surrogate, discussions with consultants, evaluation of patient's response to treatment, examination of patient, ordering and performing treatments and interventions, ordering and review of laboratory studies, ordering and review of radiographic studies, pulse oximetry, re-evaluation of patient's condition and review of old charts   Medications Ordered in ED Medications  ondansetron (ZOFRAN) injection 4 mg (4 mg Intravenous Not Given 03/25/21 1904)  ondansetron (ZOFRAN) injection 4 mg (4 mg Intravenous Given 03/25/21 1710)  sodium chloride 0.9 % bolus 500 mL (0 mLs Intravenous Stopped 03/25/21 1952)    ED Course  I have reviewed the triage vital signs and the nursing notes.  Pertinent labs & imaging results that were available during my care of the patient were reviewed by me and considered in my medical decision  making (see chart for details).    MDM Rules/Calculators/A&P                          Patient Vitals for the past 24 hrs:  BP Temp Temp src Pulse Resp SpO2 Height Weight  03/25/21 2112 132/85 -- -- 79 14 97 % -- --  03/25/21 1915 138/74 -- -- 61 18 95 % -- --  03/25/21 1815 (!) 158/69 -- -- 84 -- 95 % -- --  03/25/21 1745 (!) 157/97 -- -- 84 -- 98 % -- --  03/25/21 1715 (!) 164/74 -- -- 83 (!) 25 98 % -- --  03/25/21 1543 (!) 147/76 98.4 F (36.9 C) Oral 84 18 97 % 5' 2.5" (1.588 m) 52.2 kg  03/25/21 1541 -- -- -- -- -- 97 % -- --    10:20 PM Reevaluation with update and discussion. After initial assessment and treatment, an updated evaluation reveals she is very comfortable now drinking oral contrast, awaiting CT scan.Daleen Bo   Medical Decision Making:  This patient is presenting for evaluation of possible gallbladder disease, which does require a range of treatment options, and is a complaint that involves a moderate risk of morbidity  and mortality. The differential diagnoses include stable chronic gallbladder changes, acute cholecystitis, metabolic disorder. I decided to review old records, and in summary elderly female presenting with recurrent symptoms with known gallbladder sludge.  I did not require additional historical information from anyone.  Clinical Laboratory Tests Ordered, included CBC, Metabolic panel, and lipase, COVID test, flu test, urinalysis . Review indicates normal except Vicon high, MCV high, sodium low, glucose high, I AST high, ALT high, alk phos stays high, total bilirubin high, GFR low. Radiologic Tests Ordered, included ultrasound abdomen.  I independently Visualized: Radiographic images, which show uncomplicated cholelithiasis and hepatic cysts  Cardiac Monitor Tracing which shows normal sinus rhythm    Critical Interventions-clinical evaluation, laboratory testing, ultrasonography, observation and reassessment.  Discussion with surgery for  consultation.  After These Interventions, the Patient was reevaluated and was found with unclear clinical picture requiring more extensive evaluation, CT and MRI of the abdomen have been ordered.  Recent clinical history consistent with passage of stones causing transient LFT abnormalities.  She presents with a very high white count but no apparent gallbladder infection on ultrasound imaging.  No indication to initiate antibiotics at this time.  General surgery seen the patient and is requesting medical admission for management due to comorbidities and unclear clinical status.  CRITICAL CARE-yes Performed by: Daleen Bo  Nursing Notes Reviewed/ Care Coordinated Applicable Imaging Reviewed Interpretation of Laboratory Data incorporated into ED treatment  10:26 PM-Consult complete with hospitalist. Patient case explained and discussed.  He agrees to admit patient for further evaluation and treatment. Call ended at 10:30 PM      Final Clinical Impression(s) / ED Diagnoses Final diagnoses:  RUQ abdominal pain  Calculus of gallbladder with biliary obstruction but without cholecystitis  Transaminitis  Hyperbilirubinemia    Rx / DC Orders ED Discharge Orders     None           Daleen Bo, MD 03/25/21 2235

## 2021-03-25 NOTE — Assessment & Plan Note (Signed)
Continue Cozaar 

## 2021-03-25 NOTE — ED Notes (Signed)
Patient transported to MRI 

## 2021-03-25 NOTE — Assessment & Plan Note (Signed)
Check acute hepatitis panel.  Repeat chemistry in the morning.

## 2021-03-25 NOTE — ED Provider Notes (Signed)
Emergency Medicine Provider Triage Evaluation Note  Rebecca [name] , a 87 y.o. female  was evaluated in triage.  Pt complains of abdominal pain.  Patient arrives via EMS from home.  She states that yesterday she began having right upper, epigastric, left upper abdominal pain that she states radiates up into her chest.  She has associated nausea and dry heaving.  Nothing makes it better or worse.  She denies fevers, diarrhea.  She does state that she was previously jaundiced which has resolved at this time.  She states that she is scheduled to see a surgeon on 03/30/2021 for potential cholecystectomy.  She is post to be talking to Dr. Zenia Resides at [facility] surgery.  Review of Systems  Positive: See above Negative:   Physical Exam  BP (!) 147/76 (BP Location: Left Arm)    Pulse 84    Temp 98.4 F (36.9 C) (Oral)    Resp 18    Ht 5' 2.5" (1.588 m)    Wt 52.2 kg    SpO2 97%    BMI 20.70 kg/m  Gen:   Awake, no distress, alert and oriented Resp:  Normal effort  MSK:   Moves extremities without difficulty  Other:  Abdomen is protuberant, soft, nontender to palpation.  Bowel sounds present.  There is no jaundice present.  S1/S2 without murmur.  Medical Decision Making  Medically screening exam initiated at 3:49 PM.  Appropriate orders placed.  JANALYNN EDER was informed that the remainder of the evaluation will be completed by another provider, this initial triage assessment does not replace that evaluation, and the importance of remaining in the ED until their evaluation is complete.     Mickie Hillier, PA-C 03/25/21 1551    Varney Biles, MD 03/26/21 [phone]

## 2021-03-25 NOTE — Assessment & Plan Note (Signed)
Continue PPI ?

## 2021-03-25 NOTE — Consult Note (Addendum)
Reason for Consult: Jaundice, abdominal pain Referring Physician: Eulis Foster MD  Rebecca [name] is an 87 y.o. female.  HPI: Asked see patient at the request of the EDP due to abdominal pain.  She has had an over 37-month history of chest pain nausea, vomiting after meals.  She is also been jaundiced after reviewing her records dating back to 02/10/2021.  She was seeing her primary care doctor at the time.  She also is a white count 27,000.  Denies any dysuria or polyuria.  Her biggest complaint is chest pain and inability to eat.  She lays on her right side to make the chest pain got better.  She did have gallstones on work-up.  Her bilirubin is 3.6 tonight.  It was 2.6     7 weeks ago.  Also her alkaline phosphatase is up as well.  Denies any abdominal pain.  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[place])    Tubular adenoma of colon 05/2009    Past Surgical History:  Procedure Laterality Date   APPENDECTOMY     ARCUATE KERATECTOMY     CATARACT EXTRACTION, BILATERAL  2015   double pallital tori bone removed      HERNIA REPAIR     umbilical   KNEE SURGERY Bilateral    morton's neuroma on rt foot and platar facial release     MOUTH SURGERY     "bone shaved off roof of mouth"   SALIVARY GLAND SURGERY     removal   TONSILLECTOMY      Family History  Problem Relation Age of Onset   Diabetes Mother    Heart disease Mother    Hyperlipidemia Mother    Hypertension Mother    Anuerysm Father    Heart disease Father    Hypertension Father    Migraines Daughter    Kidney Stones Daughter    Colon polyps Sister    Lung cancer Sister    Colon polyps Brother    Diabetes Brother    Hyperlipidemia Brother    Hypertension  Brother    Colon polyps Daughter    Breast cancer Daughter    Colon cancer Maternal Aunt        Rectal cancer   Cancer Daughter        salivary   Colon cancer Paternal Grandmother        with possible stomach cancer   Heart disease Brother    Crohn's disease Neg Hx    Pancreatic cancer Neg Hx     Social History:  reports that she has never smoked. She has never used smokeless tobacco. She reports current alcohol use. She reports that she does not use drugs.  Allergies:  Allergies  Allergen Reactions   Levaquin [Levofloxacin] Other (See Comments)    Very nauseous and shakey   Sulfamethoxazole-Trimethoprim Nausea Only    shakey   Lactose Intolerance (Gi) Diarrhea and Other (See Comments)    Stomach hurts    Tramadol Hcl Nausea Only and Palpitations   Tramadol Hcl Nausea Only and Palpitations   Augmentin [Amoxicillin-Pot Clavulanate] Diarrhea   Ciprofloxacin Nausea Only  REACTION: Thrush, Nausea, Shakey   Pneumococcal Vaccine Polyvalent     REACTION: RED AND RAISED RASH ON ARM   Adhesive [Tape] Rash   Codeine Nausea Only    Medications: I have reviewed the patient's current medications.  Results for orders placed or performed during the hospital encounter of 03/25/21 (from the past 48 hour(s))  Comprehensive metabolic panel     Status: Abnormal   Collection Time: 03/25/21  3:57 PM  Result Value Ref Range   Sodium 128 (L) 135 - 145 mmol/L   Potassium 4.1 3.5 - 5.1 mmol/L   Chloride 98 98 - 111 mmol/L   CO2 23 22 - 32 mmol/L   Glucose, Bld 176 (H) 70 - 99 mg/dL    Comment: Glucose reference range applies only to samples taken after fasting for at least 8 hours.   BUN 12 8 - 23 mg/dL   Creatinine, Ser 0.98 0.44 - 1.00 mg/dL   Calcium 9.7 8.9 - 10.3 mg/dL   Total Protein 6.8 6.5 - 8.1 g/dL   Albumin 3.9 3.5 - 5.0 g/dL   AST 356 (H) 15 - 41 U/L    Comment: RESULTS CONFIRMED BY MANUAL DILUTION   ALT 294 (H) 0 - 44 U/L   Alkaline Phosphatase 174 (H) 38 - 126 U/L    Total Bilirubin 2.6 (H) 0.3 - 1.2 mg/dL   GFR, Estimated 56 (L) >60 mL/min    Comment: (NOTE) Calculated using the CKD-EPI Creatinine Equation (2021)    Anion gap 7 5 - 15    Comment: Performed at [facility], [place] [address], [place], [place] [address]  Lipase, blood     Status: None   Collection Time: 03/25/21  3:57 PM  Result Value Ref Range   Lipase 26 11 - 51 U/L    Comment: Performed at [facility], [place] [address] [place], [place] [address]  CBC with Differential     Status: Abnormal   Collection Time: 03/25/21  3:57 PM  Result Value Ref Range   WBC 27.4 (H) 4.0 - 10.5 K/uL    Comment: WHITE COUNT CONFIRMED ON SMEAR   RBC 3.65 (L) 3.87 - 5.11 MIL/uL   Hemoglobin 12.2 12.0 - 15.0 g/dL   HCT 36.8 36.0 - 46.0 %   MCV 100.8 (H) 80.0 - 100.0 fL   MCH 33.4 26.0 - 34.0 pg   MCHC 33.2 30.0 - 36.0 g/dL   RDW 12.9 11.5 - 15.5 %   Platelets 261 150 - 400 K/uL   nRBC 0.0 0.0 - 0.2 %   Neutrophils Relative % 30 %   Neutro Abs 8.3 (H) 1.7 - 7.7 K/uL   Lymphocytes Relative 62 %   Lymphs Abs 16.8 (H) 0.7 - 4.0 K/uL   Monocytes Relative 8 %   Monocytes Absolute 2.1 (H) 0.1 - 1.0 K/uL   Eosinophils Relative 0 %   Eosinophils Absolute 0.0 0.0 - 0.5 K/uL   Basophils Relative 0 %   Basophils Absolute 0.1 0.0 - 0.1 K/uL   WBC Morphology VARIANT LYMPHOCYTES SEEN    Immature Granulocytes 0 %   Abs Immature Granulocytes 0.07 0.00 - 0.07 K/uL    Comment: Performed at [facility], [place] [address] [place], [place] [address]  Resp Panel by RT-PCR (Flu A&B, Covid) Nasopharyngeal Swab     Status: None   Collection Time: 03/25/21  8:26 PM   Specimen: Nasopharyngeal Swab; Nasopharyngeal(NP) swabs in vial transport medium  Result Value  Ref Range   SARS Coronavirus 2 by RT PCR NEGATIVE NEGATIVE    Comment: (NOTE) SARS-CoV-2 target nucleic acids are NOT DETECTED.  The SARS-CoV-2 RNA is generally detectable in upper respiratory specimens  during the acute phase of infection. The lowest concentration of SARS-CoV-2 viral copies this assay can detect is 138 copies/mL. A negative result does not preclude SARS-Cov-2 infection and should not be used as the sole basis for treatment or other patient management decisions. A negative result may occur with  improper specimen collection/handling, submission of specimen other than nasopharyngeal swab, presence of viral mutation(s) within the areas targeted by this assay, and inadequate number of viral copies(<138 copies/mL). A negative result must be combined with clinical observations, patient history, and epidemiological information. The expected result is Negative.  Fact Sheet for Patients:  EntrepreneurPulse.com.au  Fact Sheet for Healthcare Providers:  IncredibleEmployment.be  This test is no t yet approved or cleared by the [place] FDA and  has been authorized for detection and/or diagnosis of SARS-CoV-2 by FDA under an Emergency Use Authorization (EUA). This EUA will remain  in effect (meaning this test can be used) for the duration of the COVID-19 declaration under Section 564(b)(1) of the Act, 21 U.S.C.section 360bbb-3(b)(1), unless the authorization is terminated  or revoked sooner.       Influenza A by PCR NEGATIVE NEGATIVE   Influenza B by PCR NEGATIVE NEGATIVE    Comment: (NOTE) The Xpert Xpress SARS-CoV-2/FLU/RSV plus assay is intended as an aid in the diagnosis of influenza from Nasopharyngeal swab specimens and should not be used as a sole basis for treatment. Nasal washings and aspirates are unacceptable for Xpert Xpress SARS-CoV-2/FLU/RSV testing.  Fact Sheet for Patients: EntrepreneurPulse.com.au  Fact Sheet for Healthcare Providers: IncredibleEmployment.be  This test is not yet approved or cleared by the [place] FDA and has been authorized for detection and/or diagnosis  of SARS-CoV-2 by FDA under an Emergency Use Authorization (EUA). This EUA will remain in effect (meaning this test can be used) for the duration of the COVID-19 declaration under Section 564(b)(1) of the Act, 21 U.S.C. section 360bbb-3(b)(1), unless the authorization is terminated or revoked.  Performed at [facility], [place] [address], [place], [place] [address]     US Abdomen Limited RUQ (LIVER/GB)  Result Date: 03/25/2021 CLINICAL DATA:  Right upper quadrant pain. EXAM: ULTRASOUND ABDOMEN LIMITED RIGHT UPPER QUADRANT COMPARISON:  CT abdomen 02/23/2016 FINDINGS: Gallbladder: Cholelithiasis without gallbladder wall thickening or pericholecystic fluid. Negative sonographic Murphy sign. Largest gallstone measures 7 mm. Common bile duct: Diameter: 3 mm Liver: Normal hepatic parenchymal echogenicity. Multiple anechoic hepatic masses consistent with cysts unchanged compared with the prior examination of 02/23/2016. There is a 1.6 x 1.5 x 1.4 cm right hepatic cyst. There is a 2.6 x 2.3 x 2.3 cm right hepatic cyst. There is a 9 x 9 x 11 mm left hepatic cyst. Mild prominence of the intrahepatic biliary ducts. Portal vein is patent on color Doppler imaging with normal direction of blood flow towards the liver. Other: None. IMPRESSION: 1. Cholelithiasis without sonographic evidence of acute cholecystitis. 2. Multiple hepatic cysts. Electronically Signed   By: Kathreen Devoid M.D.   On: 03/25/2021 18:45    Review of Systems  Constitutional:  Positive for appetite change and fever. Negative for fatigue.  HENT: Negative.    Eyes: Negative.   Respiratory: Negative.    Cardiovascular:  Positive for chest pain.  Gastrointestinal:  Positive for nausea and vomiting. Negative for abdominal  distention and abdominal pain.  Genitourinary:  Negative for hematuria and urgency.  Musculoskeletal:  Positive for back pain.  Skin:  Positive for color change.  Neurological:  Negative for headaches.   Hematological: Negative.   Psychiatric/Behavioral: Negative.    Blood pressure 132/85, pulse 79, temperature 98.4 F (36.9 C), temperature source Oral, resp. rate 14, height 5' 2.5" (1.588 m), weight 52.2 kg, SpO2 97 %. Physical Exam Constitutional:      General: She is not in acute distress.    Appearance: She is well-developed.  HENT:     Head: Normocephalic.  Eyes:     General: Scleral icterus present.  Cardiovascular:     Rate and Rhythm: Normal rate and regular rhythm.  Pulmonary:     Effort: Pulmonary effort is normal.     Breath sounds: Stridor present.  Abdominal:     General: Abdomen is flat.     Palpations: Abdomen is soft.     Tenderness: There is no abdominal tenderness.     Hernia: No hernia is present.  Skin:    General: Skin is warm and dry.     Coloration: Skin is jaundiced.  Neurological:     General: No focal deficit present.     Mental Status: She is alert.  Psychiatric:        Mood and Affect: Mood normal.        Behavior: Behavior normal.    Assessment/Plan: Jaundice with gallstones.  She has no abdominal pain or tenderness so signs are not convincing for acute cholecystitis.  She is also hyponatremic.  Recommend further work-up with CT scan to exclude pancreatic mass or obstruction, MRCP to look for common duct stones.  Once her elevated liver function studies worked up further, we can discuss the role cholecystectomy for her but until these other issues are resolved there is no acute need for surgery tonight or to probably tomorrow.  I have ordered an MRCP for tomorrow and the CT will be done tonight.  Recommend medical admission given advanced age and hyponatremia at this point time.   Total time 40 minutes reviewing chart, examining patient, reviewing old records, dictation and documentation Joyice Faster Suzzette Gasparro MD  03/25/2021, 9:59 PM

## 2021-03-25 NOTE — ED Triage Notes (Signed)
BIB EMS, pt is scheduled to see a sx on 12/27 for potential gallbladder removal. Pain started around 12:30 last night, now pain radiates up into her chest. Reports pain was unbearable. Reports nausea.   Vitals  146/84 BP 84 HR 26 RR 97% RA O2

## 2021-03-25 NOTE — ACP (Advance Care Planning) (Signed)
°  Advance Care Planning  Reason for Advance Care Planning Conversation: Acute hospitalization Principal Problem:   Cholelithiasis Active Problems:   Elevated LFTs   Chronic hyponatremia   Hypothyroidism   Essential hypertension   GERD    I discussed with patient about advance care planning. Specifically, we discussed whether patient would desire cardiopulmonary resuscitation (CPR) in the event of acute cardiopulmonary arrest. We also discussed whether endotracheal intubation and temporary ventilator life support would be desired in the event of acute cardio- or pulmonary decompensation.   Code status order of DNR has been entered in accord with the patient's wishes. no intubation  Living will: no  Health Care Agent / Davonna Belling              Name: tom Mura: Relationship to Patient: son   Is agent appointed in legal document no  Time spent today in ACP discussion was  5 mins  Kristopher Oppenheim, DO [facility]

## 2021-03-25 NOTE — ED Triage Notes (Signed)
Per [facility] pt coming from home c/o abdominal pain and dry heaving starting last night. States she has an appt on Tuesday with surgeon to discuss getting out her gallbladder.

## 2021-03-26 ENCOUNTER — Encounter (HOSPITAL_COMMUNITY): Payer: Self-pay | Admitting: Internal Medicine

## 2021-03-26 DIAGNOSIS — E440 Moderate protein-calorie malnutrition: Secondary | ICD-10-CM | POA: Diagnosis present

## 2021-03-26 DIAGNOSIS — K811 Chronic cholecystitis: Secondary | ICD-10-CM | POA: Diagnosis not present

## 2021-03-26 DIAGNOSIS — K8020 Calculus of gallbladder without cholecystitis without obstruction: Secondary | ICD-10-CM

## 2021-03-26 DIAGNOSIS — D649 Anemia, unspecified: Secondary | ICD-10-CM | POA: Diagnosis not present

## 2021-03-26 DIAGNOSIS — K279 Peptic ulcer, site unspecified, unspecified as acute or chronic, without hemorrhage or perforation: Secondary | ICD-10-CM | POA: Diagnosis not present

## 2021-03-26 DIAGNOSIS — Z8371 Family history of colonic polyps: Secondary | ICD-10-CM | POA: Diagnosis not present

## 2021-03-26 DIAGNOSIS — I10 Essential (primary) hypertension: Secondary | ICD-10-CM | POA: Diagnosis present

## 2021-03-26 DIAGNOSIS — K2100 Gastro-esophageal reflux disease with esophagitis, without bleeding: Secondary | ICD-10-CM | POA: Diagnosis not present

## 2021-03-26 DIAGNOSIS — K8010 Calculus of gallbladder with chronic cholecystitis without obstruction: Secondary | ICD-10-CM | POA: Diagnosis not present

## 2021-03-26 DIAGNOSIS — J309 Allergic rhinitis, unspecified: Secondary | ICD-10-CM | POA: Diagnosis not present

## 2021-03-26 DIAGNOSIS — K8063 Calculus of gallbladder and bile duct with acute cholecystitis with obstruction: Secondary | ICD-10-CM | POA: Diagnosis present

## 2021-03-26 DIAGNOSIS — K8050 Calculus of bile duct without cholangitis or cholecystitis without obstruction: Secondary | ICD-10-CM

## 2021-03-26 DIAGNOSIS — K219 Gastro-esophageal reflux disease without esophagitis: Secondary | ICD-10-CM | POA: Diagnosis present

## 2021-03-26 DIAGNOSIS — Z803 Family history of malignant neoplasm of breast: Secondary | ICD-10-CM | POA: Diagnosis not present

## 2021-03-26 DIAGNOSIS — R197 Diarrhea, unspecified: Secondary | ICD-10-CM | POA: Diagnosis not present

## 2021-03-26 DIAGNOSIS — R7989 Other specified abnormal findings of blood chemistry: Secondary | ICD-10-CM | POA: Diagnosis not present

## 2021-03-26 DIAGNOSIS — Z833 Family history of diabetes mellitus: Secondary | ICD-10-CM | POA: Diagnosis not present

## 2021-03-26 DIAGNOSIS — Z83438 Family history of other disorder of lipoprotein metabolism and other lipidemia: Secondary | ICD-10-CM | POA: Diagnosis not present

## 2021-03-26 DIAGNOSIS — K8021 Calculus of gallbladder without cholecystitis with obstruction: Secondary | ICD-10-CM

## 2021-03-26 DIAGNOSIS — R109 Unspecified abdominal pain: Secondary | ICD-10-CM | POA: Diagnosis present

## 2021-03-26 DIAGNOSIS — I959 Hypotension, unspecified: Secondary | ICD-10-CM | POA: Diagnosis present

## 2021-03-26 DIAGNOSIS — K8309 Other cholangitis: Secondary | ICD-10-CM | POA: Diagnosis not present

## 2021-03-26 DIAGNOSIS — E559 Vitamin D deficiency, unspecified: Secondary | ICD-10-CM | POA: Diagnosis not present

## 2021-03-26 DIAGNOSIS — I452 Bifascicular block: Secondary | ICD-10-CM | POA: Diagnosis present

## 2021-03-26 DIAGNOSIS — A419 Sepsis, unspecified organism: Secondary | ICD-10-CM | POA: Diagnosis present

## 2021-03-26 DIAGNOSIS — Z800 Family history of malignant neoplasm of digestive organs: Secondary | ICD-10-CM | POA: Diagnosis not present

## 2021-03-26 DIAGNOSIS — Z66 Do not resuscitate: Secondary | ICD-10-CM | POA: Diagnosis present

## 2021-03-26 DIAGNOSIS — E785 Hyperlipidemia, unspecified: Secondary | ICD-10-CM | POA: Diagnosis present

## 2021-03-26 DIAGNOSIS — F419 Anxiety disorder, unspecified: Secondary | ICD-10-CM | POA: Diagnosis present

## 2021-03-26 DIAGNOSIS — Z8249 Family history of ischemic heart disease and other diseases of the circulatory system: Secondary | ICD-10-CM | POA: Diagnosis not present

## 2021-03-26 DIAGNOSIS — E861 Hypovolemia: Secondary | ICD-10-CM | POA: Diagnosis present

## 2021-03-26 DIAGNOSIS — R6520 Severe sepsis without septic shock: Secondary | ICD-10-CM | POA: Diagnosis present

## 2021-03-26 DIAGNOSIS — E871 Hypo-osmolality and hyponatremia: Secondary | ICD-10-CM | POA: Diagnosis present

## 2021-03-26 DIAGNOSIS — E876 Hypokalemia: Secondary | ICD-10-CM | POA: Diagnosis present

## 2021-03-26 DIAGNOSIS — D638 Anemia in other chronic diseases classified elsewhere: Secondary | ICD-10-CM | POA: Diagnosis present

## 2021-03-26 DIAGNOSIS — Z20822 Contact with and (suspected) exposure to covid-19: Secondary | ICD-10-CM | POA: Diagnosis present

## 2021-03-26 DIAGNOSIS — E039 Hypothyroidism, unspecified: Secondary | ICD-10-CM | POA: Diagnosis present

## 2021-03-26 DIAGNOSIS — K8000 Calculus of gallbladder with acute cholecystitis without obstruction: Secondary | ICD-10-CM | POA: Diagnosis not present

## 2021-03-26 DIAGNOSIS — Z801 Family history of malignant neoplasm of trachea, bronchus and lung: Secondary | ICD-10-CM | POA: Diagnosis not present

## 2021-03-26 LAB — CBC WITH DIFFERENTIAL/PLATELET
Abs Immature Granulocytes: 0.07 10*3/uL (ref 0.00–0.07)
Basophils Absolute: 0 10*3/uL (ref 0.0–0.1)
Basophils Relative: 0 %
Eosinophils Absolute: 0 10*3/uL (ref 0.0–0.5)
Eosinophils Relative: 0 %
HCT: 28.3 % — ABNORMAL LOW (ref 36.0–46.0)
Hemoglobin: 9.8 g/dL — ABNORMAL LOW (ref 12.0–15.0)
Immature Granulocytes: 0 %
Lymphocytes Relative: 60 %
Lymphs Abs: 13.6 10*3/uL — ABNORMAL HIGH (ref 0.7–4.0)
MCH: 34.4 pg — ABNORMAL HIGH (ref 26.0–34.0)
MCHC: 34.6 g/dL (ref 30.0–36.0)
MCV: 99.3 fL (ref 80.0–100.0)
Monocytes Absolute: 1.5 10*3/uL — ABNORMAL HIGH (ref 0.1–1.0)
Monocytes Relative: 7 %
Neutro Abs: 7.7 10*3/uL (ref 1.7–7.7)
Neutrophils Relative %: 33 %
Platelets: 184 10*3/uL (ref 150–400)
RBC: 2.85 MIL/uL — ABNORMAL LOW (ref 3.87–5.11)
RDW: 13.2 % (ref 11.5–15.5)
WBC: 23 10*3/uL — ABNORMAL HIGH (ref 4.0–10.5)
nRBC: 0 % (ref 0.0–0.2)

## 2021-03-26 LAB — T4, FREE: Free T4: 1.43 ng/dL — ABNORMAL HIGH (ref 0.61–1.12)

## 2021-03-26 LAB — PROTIME-INR
INR: 1.3 — ABNORMAL HIGH (ref 0.8–1.2)
Prothrombin Time: 16.5 seconds — ABNORMAL HIGH (ref 11.4–15.2)

## 2021-03-26 LAB — URINALYSIS, ROUTINE W REFLEX MICROSCOPIC
Bacteria, UA: NONE SEEN
Glucose, UA: NEGATIVE mg/dL
Hgb urine dipstick: NEGATIVE
Ketones, ur: NEGATIVE mg/dL
Nitrite: NEGATIVE
Specific Gravity, Urine: 1.015 (ref 1.005–1.030)
pH: 7 (ref 5.0–8.0)

## 2021-03-26 LAB — SURGICAL PCR SCREEN
MRSA, PCR: NEGATIVE
Staphylococcus aureus: POSITIVE — AB

## 2021-03-26 LAB — COMPREHENSIVE METABOLIC PANEL
ALT: 216 U/L — ABNORMAL HIGH (ref 0–44)
AST: 169 U/L — ABNORMAL HIGH (ref 15–41)
Albumin: 2.9 g/dL — ABNORMAL LOW (ref 3.5–5.0)
Alkaline Phosphatase: 124 U/L (ref 38–126)
Anion gap: 5 (ref 5–15)
BUN: 11 mg/dL (ref 8–23)
CO2: 22 mmol/L (ref 22–32)
Calcium: 8.9 mg/dL (ref 8.9–10.3)
Chloride: 99 mmol/L (ref 98–111)
Creatinine, Ser: 0.87 mg/dL (ref 0.44–1.00)
GFR, Estimated: >60 mL/min (ref >60)
Glucose, Bld: 128 mg/dL — ABNORMAL HIGH (ref 70–99)
Potassium: 4 mmol/L (ref 3.5–5.1)
Sodium: 126 mmol/L — ABNORMAL LOW (ref 135–145)
Total Bilirubin: 2.9 mg/dL — ABNORMAL HIGH (ref 0.3–1.2)
Total Protein: 5.1 g/dL — ABNORMAL LOW (ref 6.5–8.1)

## 2021-03-26 LAB — HEPATITIS PANEL, ACUTE
HCV Ab: NONREACTIVE
Hep A IgM: NONREACTIVE
Hep B C IgM: NONREACTIVE
Hepatitis B Surface Ag: NONREACTIVE

## 2021-03-26 LAB — TSH: TSH: 0.766 u[IU]/mL (ref 0.350–4.500)

## 2021-03-26 LAB — PATHOLOGIST SMEAR REVIEW

## 2021-03-26 MED ORDER — PIPERACILLIN-TAZOBACTAM 3.375 G IVPB
3.3750 g | Freq: Three times a day (TID) | INTRAVENOUS | Status: DC
  Administered 2021-03-26 – 2021-03-31 (×13): 3.375 g via INTRAVENOUS
  Filled 2021-03-26 (×15): qty 50

## 2021-03-26 MED ORDER — ONDANSETRON HCL 4 MG/2ML IJ SOLN
4.0000 mg | Freq: Four times a day (QID) | INTRAMUSCULAR | Status: DC | PRN
  Administered 2021-03-27: 10:00:00 4 mg via INTRAVENOUS
  Filled 2021-03-26: qty 2

## 2021-03-26 MED ORDER — PIPERACILLIN-TAZOBACTAM 3.375 G IVPB
3.3750 g | Freq: Three times a day (TID) | INTRAVENOUS | Status: DC
  Administered 2021-03-26 (×2): 3.375 g via INTRAVENOUS
  Filled 2021-03-26 (×3): qty 50

## 2021-03-26 MED ORDER — BOOST PLUS PO LIQD
237.0000 mL | Freq: Three times a day (TID) | ORAL | Status: DC
  Administered 2021-03-29 – 2021-03-31 (×2): 237 mL via ORAL
  Filled 2021-03-26 (×18): qty 237

## 2021-03-26 MED ORDER — MORPHINE SULFATE (PF) 2 MG/ML IV SOLN: 1.0000 mg | INTRAVENOUS | Status: DC | PRN

## 2021-03-26 MED ORDER — LOPERAMIDE HCL 2 MG PO CAPS
4.0000 mg | ORAL_CAPSULE | Freq: Once | ORAL | Status: AC
  Administered 2021-03-26: 20:00:00 4 mg via ORAL
  Filled 2021-03-26: qty 2

## 2021-03-26 MED ORDER — GADOBUTROL 1 MMOL/ML IV SOLN
5.0000 mL | Freq: Once | INTRAVENOUS | Status: AC | PRN
  Administered 2021-03-26: 01:00:00 5 mL via INTRAVENOUS

## 2021-03-26 MED ORDER — FENTANYL CITRATE (PF) 100 MCG/2ML IJ SOLN
INTRAMUSCULAR | Status: AC
  Filled 2021-03-26: qty 2

## 2021-03-26 MED ORDER — PANTOPRAZOLE SODIUM 40 MG IV SOLR
40.0000 mg | Freq: Every day | INTRAVENOUS | Status: DC
Start: 2021-03-26 — End: 2021-03-27
  Administered 2021-03-26 – 2021-03-27 (×2): 40 mg via INTRAVENOUS
  Filled 2021-03-26 (×2): qty 40

## 2021-03-26 MED ORDER — DEXTROSE-NACL 5-0.9 % IV SOLN: INTRAVENOUS | Status: DC

## 2021-03-26 MED ORDER — ACETAMINOPHEN 325 MG PO TABS
650.0000 mg | ORAL_TABLET | Freq: Four times a day (QID) | ORAL | Status: DC | PRN
  Administered 2021-03-29 (×3): 650 mg via ORAL
  Filled 2021-03-26 (×3): qty 2

## 2021-03-26 MED ORDER — MUPIROCIN 2 % EX OINT
1.0000 "application " | TOPICAL_OINTMENT | Freq: Two times a day (BID) | CUTANEOUS | Status: DC
  Administered 2021-03-26 – 2021-03-30 (×8): 1 via NASAL
  Filled 2021-03-26 (×2): qty 22

## 2021-03-26 MED ORDER — LOSARTAN POTASSIUM 50 MG PO TABS: 100.0000 mg | ORAL_TABLET | Freq: Every day | ORAL | Status: DC

## 2021-03-26 MED ORDER — ALPRAZOLAM 0.25 MG PO TABS: 0.2500 mg | ORAL_TABLET | Freq: Every evening | ORAL | Status: DC | PRN

## 2021-03-26 MED ORDER — LEVOTHYROXINE SODIUM 88 MCG PO TABS
88.0000 ug | ORAL_TABLET | Freq: Every day | ORAL | Status: DC
  Administered 2021-03-29 – 2021-04-01 (×4): 88 ug via ORAL
  Filled 2021-03-26 (×6): qty 1

## 2021-03-26 MED ORDER — ADULT MULTIVITAMIN W/MINERALS CH
1.0000 | ORAL_TABLET | Freq: Every day | ORAL | Status: DC
  Administered 2021-03-26 – 2021-03-31 (×3): 1 via ORAL
  Filled 2021-03-26 (×4): qty 1

## 2021-03-26 MED ORDER — SODIUM CHLORIDE 0.9 % IV BOLUS
500.0000 mL | Freq: Once | INTRAVENOUS | Status: AC
  Administered 2021-03-26: 11:00:00 500 mL via INTRAVENOUS

## 2021-03-26 MED ORDER — ACETAMINOPHEN 650 MG RE SUPP: 650.0000 mg | Freq: Four times a day (QID) | RECTAL | Status: DC | PRN

## 2021-03-26 MED ORDER — PANTOPRAZOLE SODIUM 40 MG PO TBEC: 40.0000 mg | DELAYED_RELEASE_TABLET | Freq: Every day | ORAL | Status: DC

## 2021-03-26 MED ORDER — ONDANSETRON HCL 4 MG PO TABS: 4.0000 mg | ORAL_TABLET | Freq: Four times a day (QID) | ORAL | Status: DC | PRN

## 2021-03-26 NOTE — Anesthesia Preprocedure Evaluation (Addendum)
Anesthesia Evaluation  Patient identified by MRN, date of birth, ID band Patient awake    Reviewed: Allergy & Precautions, NPO status , Patient's Chart, lab work & pertinent test results  Airway Mallampati: II  TM Distance: >3 FB Neck ROM: Full    Dental  (+) Dental Advisory Given   Pulmonary neg pulmonary ROS,    Pulmonary exam normal        Cardiovascular hypertension, + Peripheral Vascular Disease  Normal cardiovascular exam     Neuro/Psych PSYCHIATRIC DISORDERS Anxiety negative neurological ROS     GI/Hepatic Neg liver ROS, PUD, GERD  ,  Endo/Other  Hypothyroidism   Renal/GU negative Renal ROS     Musculoskeletal negative musculoskeletal ROS (+)   Abdominal   Peds  Hematology  (+) anemia ,   Anesthesia Other Findings   Reproductive/Obstetrics                            Anesthesia Physical Anesthesia Plan  ASA: 3  Anesthesia Plan: General   Post-op Pain Management: Minimal or no pain anticipated   Induction: Intravenous  PONV Risk Score and Plan: 3 and Ondansetron, Dexamethasone and Treatment may vary due to age or medical condition  Airway Management Planned: Oral ETT  Additional Equipment: None  Intra-op Plan:   Post-operative Plan: Extubation in OR  Informed Consent: I have reviewed the patients History and Physical, chart, labs and discussed the procedure including the risks, benefits and alternatives for the proposed anesthesia with the patient or authorized representative who has indicated his/her understanding and acceptance.   Patient has DNR.  Discussed DNR with patient and Suspend DNR.   Dental advisory given  Plan Discussed with:   Anesthesia Plan Comments:        Anesthesia Quick Evaluation

## 2021-03-26 NOTE — Progress Notes (Signed)
Cholelithiasis, Choledocholithiasis  Subjective: Pt feels about the same.  MRCP positive for CBD stones  Objective: Vital signs in last 24 hours: Temp:  [98.4 F (36.9 C)-99.1 F (37.3 C)] 98.8 F (37.1 C) (12/23 0511) Pulse Rate:  [61-84] 61 (12/23 0511) Resp:  [14-25] 17 (12/23 0511) BP: (96-164)/(57-97) 96/57 (12/23 0511) SpO2:  [95 %-98 %] 98 % (12/23 0511) Weight:  [52 kg-52.2 kg] 52 kg (12/23 0105) Last BM Date: 03/26/21  Intake/Output from previous day: 12/22 0701 - 12/23 0700 In: 766.5 [I.V.:216.6; IV Piggyback:549.9] Out: 400 [Urine:400] Intake/Output this shift: No intake/output data recorded.  General appearance: alert and cooperative GI: normal findings: soft, mild RUQ TTP  Lab Results:  Results for orders placed or performed during the hospital encounter of 03/25/21 (from the past 24 hour(s))  Comprehensive metabolic panel     Status: Abnormal   Collection Time: 03/25/21  3:57 PM  Result Value Ref Range   Sodium 128 (L) 135 - 145 mmol/L   Potassium 4.1 3.5 - 5.1 mmol/L   Chloride 98 98 - 111 mmol/L   CO2 23 22 - 32 mmol/L   Glucose, Bld 176 (H) 70 - 99 mg/dL   BUN 12 8 - 23 mg/dL   Creatinine, Ser 0.98 0.44 - 1.00 mg/dL   Calcium 9.7 8.9 - 10.3 mg/dL   Total Protein 6.8 6.5 - 8.1 g/dL   Albumin 3.9 3.5 - 5.0 g/dL   AST 356 (H) 15 - 41 U/L   ALT 294 (H) 0 - 44 U/L   Alkaline Phosphatase 174 (H) 38 - 126 U/L   Total Bilirubin 2.6 (H) 0.3 - 1.2 mg/dL   GFR, Estimated 56 (L) >60 mL/min   Anion gap 7 5 - 15  Lipase, blood     Status: None   Collection Time: 03/25/21  3:57 PM  Result Value Ref Range   Lipase 26 11 - 51 U/L  CBC with Differential     Status: Abnormal   Collection Time: 03/25/21  3:57 PM  Result Value Ref Range   WBC 27.4 (H) 4.0 - 10.5 K/uL   RBC 3.65 (L) 3.87 - 5.11 MIL/uL   Hemoglobin 12.2 12.0 - 15.0 g/dL   HCT 36.8 36.0 - 46.0 %   MCV 100.8 (H) 80.0 - 100.0 fL   MCH 33.4 26.0 - 34.0 pg   MCHC 33.2 30.0 - 36.0 g/dL   RDW 12.9  11.5 - 15.5 %   Platelets 261 150 - 400 K/uL   nRBC 0.0 0.0 - 0.2 %   Neutrophils Relative % 30 %   Neutro Abs 8.3 (H) 1.7 - 7.7 K/uL   Lymphocytes Relative 62 %   Lymphs Abs 16.8 (H) 0.7 - 4.0 K/uL   Monocytes Relative 8 %   Monocytes Absolute 2.1 (H) 0.1 - 1.0 K/uL   Eosinophils Relative 0 %   Eosinophils Absolute 0.0 0.0 - 0.5 K/uL   Basophils Relative 0 %   Basophils Absolute 0.1 0.0 - 0.1 K/uL   WBC Morphology VARIANT LYMPHOCYTES SEEN    Immature Granulocytes 0 %   Abs Immature Granulocytes 0.07 0.00 - 0.07 K/uL  Resp Panel by RT-PCR (Flu A&B, Covid) Nasopharyngeal Swab     Status: None   Collection Time: 03/25/21  8:26 PM   Specimen: Nasopharyngeal Swab; Nasopharyngeal(NP) swabs in vial transport medium  Result Value Ref Range   SARS Coronavirus 2 by RT PCR NEGATIVE NEGATIVE   Influenza A by PCR NEGATIVE NEGATIVE   Influenza  B by PCR NEGATIVE NEGATIVE  Surgical PCR screen     Status: Abnormal   Collection Time: 03/26/21  2:01 AM   Specimen: Nasal Mucosa; Nasal Swab  Result Value Ref Range   MRSA, PCR NEGATIVE NEGATIVE   Staphylococcus aureus POSITIVE (A) NEGATIVE  TSH     Status: None   Collection Time: 03/26/21  3:38 AM  Result Value Ref Range   TSH 0.766 0.350 - 4.500 uIU/mL  T4, free     Status: Abnormal   Collection Time: 03/26/21  3:38 AM  Result Value Ref Range   Free T4 1.43 (H) 0.61 - 1.12 ng/dL  Comprehensive metabolic panel     Status: Abnormal   Collection Time: 03/26/21  3:38 AM  Result Value Ref Range   Sodium 126 (L) 135 - 145 mmol/L   Potassium 4.0 3.5 - 5.1 mmol/L   Chloride 99 98 - 111 mmol/L   CO2 22 22 - 32 mmol/L   Glucose, Bld 128 (H) 70 - 99 mg/dL   BUN 11 8 - 23 mg/dL   Creatinine, Ser 0.87 0.44 - 1.00 mg/dL   Calcium 8.9 8.9 - 10.3 mg/dL   Total Protein 5.1 (L) 6.5 - 8.1 g/dL   Albumin 2.9 (L) 3.5 - 5.0 g/dL   AST 169 (H) 15 - 41 U/L   ALT 216 (H) 0 - 44 U/L   Alkaline Phosphatase 124 38 - 126 U/L   Total Bilirubin 2.9 (H) 0.3 - 1.2  mg/dL   GFR, Estimated >60 >60 mL/min   Anion gap 5 5 - 15  CBC with Differential/Platelet     Status: Abnormal   Collection Time: 03/26/21  3:38 AM  Result Value Ref Range   WBC 23.0 (H) 4.0 - 10.5 K/uL   RBC 2.85 (L) 3.87 - 5.11 MIL/uL   Hemoglobin 9.8 (L) 12.0 - 15.0 g/dL   HCT 28.3 (L) 36.0 - 46.0 %   MCV 99.3 80.0 - 100.0 fL   MCH 34.4 (H) 26.0 - 34.0 pg   MCHC 34.6 30.0 - 36.0 g/dL   RDW 13.2 11.5 - 15.5 %   Platelets 184 150 - 400 K/uL   nRBC 0.0 0.0 - 0.2 %   Neutrophils Relative % 33 %   Neutro Abs 7.7 1.7 - 7.7 K/uL   Lymphocytes Relative 60 %   Lymphs Abs 13.6 (H) 0.7 - 4.0 K/uL   Monocytes Relative 7 %   Monocytes Absolute 1.5 (H) 0.1 - 1.0 K/uL   Eosinophils Relative 0 %   Eosinophils Absolute 0.0 0.0 - 0.5 K/uL   Basophils Relative 0 %   Basophils Absolute 0.0 0.0 - 0.1 K/uL   WBC Morphology SMUDGE CELLS    Immature Granulocytes 0 %   Abs Immature Granulocytes 0.07 0.00 - 0.07 K/uL   Reactive, Benign Lymphocytes PRESENT   Urinalysis, Routine w reflex microscopic Urine, Clean Catch     Status: Abnormal   Collection Time: 03/26/21  3:52 AM  Result Value Ref Range   Color, Urine YELLOW YELLOW   APPearance CLEAR CLEAR   Specific Gravity, Urine 1.015 1.005 - 1.030   pH 7.0 5.0 - 8.0   Glucose, UA NEGATIVE NEGATIVE mg/dL   Hgb urine dipstick NEGATIVE NEGATIVE   Bilirubin Urine SMALL (A) NEGATIVE   Ketones, ur NEGATIVE NEGATIVE mg/dL   Protein, ur TRACE (A) NEGATIVE mg/dL   Nitrite NEGATIVE NEGATIVE   Leukocytes,Ua SMALL (A) NEGATIVE   RBC / HPF 0-5 0 - 5 RBC/hpf  WBC, UA 6-10 0 - 5 WBC/hpf   Bacteria, UA NONE SEEN NONE SEEN   Squamous Epithelial / LPF 0-5 0 - 5   Mucus PRESENT      Studies/Results Radiology     MEDS, Scheduled  levothyroxine  88 mcg Oral Q0600   losartan  100 mg Oral Daily   mupirocin ointment  1 application Nasal BID   ondansetron (ZOFRAN) IV  4 mg Intravenous Once   pantoprazole  40 mg Oral Daily      Assessment: Cholelithiasis Choledocholithiasis  Plan: Consult GI for eval for ERCP Will await ERCP results before further surgical planning   LOS: 0 days    Rosario Adie, MD [facility]  Patient's medical decision making was straightforward (25 mins met or exceeded with patient care and documentation).   03/26/2021 8:58 AM

## 2021-03-26 NOTE — Plan of Care (Signed)
Plan of care discussed.   

## 2021-03-26 NOTE — H&P (View-Only) (Signed)
Referring Provider: Dr. Sander Radon  Primary Care Physician:  [name], MD Primary Gastroenterologist:  Dr. Lucio Edward  Reason for Consultation:  Nausea, upper abdominal pain, elevated LFTs with gallstones and choledocholithiasis  HPI: Rebecca [name] is a 87 y.o. female with a past medical history of anxiety, hypertension, hyperlipidemia, gallstones, GERD, gastroparesis, IBS, segmental colitis and colon polyps.  She has a history of ongoing gallbladder attacks for the past 2 months with jaundice brief jaundice 02/2021 followed by her PCP.  She was scheduled to see general surgeon Dr. Zenia Resides on 03/30/2021 to discuss outpatient laparoscopic cholecystectomy. However, she developed upper abdominal pain which radiated into her chest with nausea.  She called 911 and she was transported to [facility] ED 03/25/2021 for further evaluation. Labs in the ED showed a sodium level of 128.  Potassium 4.1.  BUN 12.  Creatinine 0.98.  Alk phos 174.  Total bili 2.6.  AST 356.  ALT 294.  WBC 27.4.  ANC 8.3.  Hemoglobin 12.2.  MCV 100.8.  Platelet 261.  Acute hepatitis panel negative.  INR not done.  SARS coronavirus 2 negative.  A twelve-lead EKG without acute ischemia, NSR with a RBBB. RUQ sono showed cholelithiasis without evidence of cholecystitis, mild prominence of the intrahepatic biliary ducts and multiple hepatic cysts.  CTAP w/o contrast showed cholelithiasis without biliary dilatation with a normal pancreas with a few loops of the small bowel were mildly prominent without evidence of an obstruction.  An abdominal MRI/MRCP w/wo showed a distended CBD with choledocholithiasis.  A GI consult was requested for further GI evaluation and possible ERCP.  She reports having episodes of epigastric pain which radiates to the RUQ and LUQ and up through her chest which typically occurs 3 to 4 hours after eating meals on and off for the past few months. No vomiting, just dry heaves. She awakened  shortly after midnight 03/25/2021 with central upper abdominal pain which radiated up through her chest and mid back with nausea, dry heaves without vomiting.  Her urine has been darker yellow for the past few days which she has also noticed intermittently over the past few months.  She is passing a normal formed brown bowel movement daily.  No acholic stools.  No pruritus.  She has lost about 8 pounds over the past 2 to 3 months.  No fever, sweats or chills.  No NSAID use.  She is not on any antiplatelet or anticoagulants.  No dysphagia or heartburn.  She takes Esomeprazole 20 mg p.o. twice daily.  No history of cardiac or pulmonary disease.  She lives by herself.  She has 1 daughter and 2 sons who live nearby.  Hepatic panel 02/10/2021: Total bili 3.7.  Alk phos 154.  AST 67.  ALT 84. Hepatic panel 02/17/2021: Total bili 1.3.  Direct bili 0.4.  Alk phos 123.  AST 27.  ALT 77.  Abdominal MRI/MRCP w/wo contrast 03/26/2021: 1. Distended common bile duct with choledocholithiasis. ERCP is recommended for follow-up. 2. Cholelithiasis. 3. Hepatic cysts. 4. Bilateral renal cysts.  CTAP without contrast 03/25/2021 Diverticulosis without diverticulitis.   Mildly prominent small bowel although measuring less than 3 cm in diameter and contrast material passes through this area without difficulty. No inflammatory changes are seen.   Cholelithiasis without definitive complicating factors. Ultrasound may be helpful for further evaluation. No other focal abnormality is noted  RUQ sonogram 03/25/2021: Gallbladder:Cholelithiasis without gallbladder wall thickening or pericholecystic fluid. Negative sonographic Murphy sign. Largest gallstone measures  7 mm.  Common bile duct:Diameter: 3 mm  Liver:Normal hepatic parenchymal echogenicity. Multiple anechoic hepatic masses consistent with cysts unchanged compared with the prior examination of 02/23/2016. There is a 1.6 x 1.5 x 1.4 cm right hepatic cyst.  There is a 2.6 x 2.3 x 2.3 cm right hepatic cyst. There is a 9 x 9 x 11 mm left hepatic cyst. Mild prominence of the intrahepatic biliary ducts. Portal vein is patent on color Doppler imaging with normal direction of blood flow towards the liver.    IMPRESSION: 1. Cholelithiasis without sonographic evidence of acute cholecystitis. 2. Multiple hepatic cysts.  RUQ sono 02/15/2021: 1. Heterogeneous, masslike material in the gallbladder favored to reflect tumefactive sludge. 2. Mild intrahepatic biliary dilatation. 3. Multiple hepatic cysts.  Colonoscopy 12/15/2014: 1. Stricture assoicated with diverticulosis in the sigmoid colon; multiple biopsies performed 2. Sessile polyp was found in the transverse colon; polypectomy was performed with a cold snare 3. Grade l internal hemorrhoids 1. Surgical [P], sigmoid biopsy - DENSELY INFLAMED POLYPOID COLORECTAL MUCOSA. - THERE IS NO EVIDENCE OF MALIGNANCY. 2. Surgical [P], transverse, polyp - TUBULAR ADENOMA(S). - HIGH GRADE DYSPLASIA IS NOT IDENTIFIED.  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[place])    Tubular adenoma of colon 05/2009   Past Surgical History:  Procedure Laterality Date   APPENDECTOMY     ARCUATE KERATECTOMY     CATARACT EXTRACTION, BILATERAL  2015   double pallital tori bone removed      HERNIA REPAIR     umbilical   KNEE SURGERY Bilateral    morton's neuroma on rt foot and platar facial release     MOUTH SURGERY     "bone shaved off roof of mouth"   SALIVARY GLAND SURGERY     removal   TONSILLECTOMY      Prior to Admission medications   Medication Sig Start Date End Date Taking? Authorizing Provider  ALPRAZolam (XANAX)  0.25 MG tablet TAKE 1 TABLET(0.25 MG) BY MOUTH AT BEDTIME AS NEEDED Patient taking differently: Take 0.25 mg by mouth at bedtime as needed for sleep. 11/30/20   Burchette, Alinda Sierras, MD  Calcium Carb-Cholecalciferol (CALCIUM-VITAMIN D) 600-400 MG-UNIT TABS Take 1 tablet by mouth daily.    [provider]  Cholecalciferol (VITAMIN D3) 2000 UNITS TABS Take 1 tablet by mouth daily.    [provider]  diclofenac sodium (VOLTAREN) 1 % GEL Apply 2 g topically 4 (four) times daily. 08/09/17   Burchette, Alinda Sierras, MD  esomeprazole (NEXIUM) 20 MG capsule Take 20 mg by mouth 2 (two) times daily before a meal.    [provider]  latanoprost (XALATAN) 0.005 % ophthalmic solution Place 1 drop into both eyes at bedtime. 01/04/21   [provider]  levothyroxine (SYNTHROID) 88 MCG tablet TAKE 1 TABLET(88 MCG) BY MOUTH DAILY Patient taking differently: Take 88 mcg by mouth daily before breakfast. 12/25/20   Burchette, Alinda Sierras, MD  losartan (COZAAR) 100 MG tablet TAKE 1 TABLET BY MOUTH EVERY DAY Patient taking differently: Take 100 mg by mouth daily. 12/28/20   Burchette, Alinda Sierras, MD  metoCLOPramide (REGLAN) 5 MG tablet TAKE 1 TABLET BY MOUTH TWICE DAILY WITH MEALS Patient taking differently: Take 5 mg by mouth 2 (two) times daily with a meal. 02/19/21   Burchette, Alinda Sierras, MD  ondansetron (ZOFRAN-ODT) 4 MG disintegrating tablet DISSOLVE 1 TABLET(4 MG) ON THE TONGUE EVERY 8 HOURS AS NEEDED FOR NAUSEA OR VOMITING Patient taking differently: Take 4 mg by mouth every 8 (eight) hours as needed for nausea or vomiting. 03/22/21   Burchette, Alinda Sierras, MD  rosuvastatin (CRESTOR) 20 MG tablet TAKE 1 TABLET(20 MG) BY MOUTH DAILY Patient taking differently: Take 20 mg by mouth daily. 12/18/20   Burchette, Alinda Sierras, MD  timolol (TIMOPTIC) 0.5 % ophthalmic solution Place 1 drop into both eyes 2 (two) times daily. 09/08/20   [provider]  vitamin B-12 (CYANOCOBALAMIN) 500 MCG tablet Take 1,000  mcg by mouth every morning.    [provider]  vitamin C (ASCORBIC ACID) 500 MG tablet Take 500 mg by mouth daily.    [provider]  olmesartan (BENICAR) 20 MG tablet Take 20 mg by mouth daily.    07/28/11  [provider]    Current Facility-Administered Medications  Medication Dose Route Frequency Provider Last Rate Last Admin   0.9 %  sodium chloride infusion   Intravenous Continuous Kristopher Oppenheim, DO 50 mL/hr at 03/26/21 0116 New Bag at 03/26/21 0116   acetaminophen (TYLENOL) tablet 650 mg  650 mg Oral Q6H PRN Kristopher Oppenheim, DO       Or   acetaminophen (TYLENOL) suppository 650 mg  650 mg Rectal Q6H PRN Kristopher Oppenheim, DO       ALPRAZolam Duanne Moron) tablet 0.25 mg  0.25 mg Oral QHS PRN Kristopher Oppenheim, DO       levothyroxine (SYNTHROID) tablet 88 mcg  88 mcg Oral Q0600 Kristopher Oppenheim, DO       losartan (COZAAR) tablet 100 mg  100 mg Oral Daily Kristopher Oppenheim, DO       mupirocin ointment (BACTROBAN) 2 % 1 application  1 application Nasal BID Kristopher Oppenheim, DO       ondansetron [facility]) injection 4 mg  4 mg Intravenous Once Kristopher Oppenheim, DO       ondansetron [facility]) tablet 4 mg  4 mg Oral Q6H PRN Kristopher Oppenheim, DO       Or   ondansetron [facility]) injection 4 mg  4 mg Intravenous Q6H PRN Kristopher Oppenheim, DO       pantoprazole (PROTONIX) EC tablet 40 mg  40 mg Oral Daily Kristopher Oppenheim, DO       piperacillin-tazobactam (ZOSYN) IVPB 3.375 g  3.375 g Intravenous Q8H Poindexter, Leann T, RPH 12.5 mL/hr at 03/26/21 0152 3.375 g at 03/26/21 0152    Allergies as of 03/25/2021 - Review Complete 03/25/2021  Allergen Reaction Noted   Levaquin [levofloxacin] Other (See Comments) 12/23/2014   Sulfamethoxazole-trimethoprim Nausea Only 03/03/2009   Lactose intolerance (gi) Diarrhea and Other (See Comments) 02/07/2013   Tramadol hcl Nausea Only and Palpitations    Tramadol hcl Nausea Only and Palpitations 11/27/2018   Augmentin [amoxicillin-pot clavulanate] Diarrhea 03/02/2016   Ciprofloxacin Nausea Only     Pneumococcal vaccine polyvalent  07/28/2008   Adhesive [tape] Rash 04/20/2011   Codeine Nausea Only     Family History  Problem Relation Age of Onset   Diabetes Mother    Heart disease Mother    Hyperlipidemia Mother    Hypertension Mother    Anuerysm Father    Heart disease  Father    Hypertension Father    Migraines Daughter    Kidney Stones Daughter    Colon polyps Sister    Lung cancer Sister    Colon polyps Brother    Diabetes Brother    Hyperlipidemia Brother    Hypertension Brother    Colon polyps Daughter    Breast cancer Daughter    Colon cancer Maternal Aunt        Rectal cancer   Cancer Daughter        salivary   Colon cancer Paternal Grandmother        with possible stomach cancer   Heart disease Brother    Crohn's disease Neg Hx    Pancreatic cancer Neg Hx     Social History   Socioeconomic History   Marital status: Widowed    Spouse name: Not on file   Number of children: 3   Years of education: Not on file   Highest education level: Not on file  Occupational History   Occupation: Glass blower/designer    Comment: retired  Tobacco Use   Smoking status: Never   Smokeless tobacco: Never  Vaping Use   Vaping Use: Never used  Substance and Sexual Activity   Alcohol use: Yes    Alcohol/week: 0.0 standard drinks    Comment: rare   Drug use: No   Sexual activity: Yes  Other Topics Concern   Not on file  Social History Narrative   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 CV: Denies chest pain, palpitations or edema. Resp: Denies cough, shortness of breath of hemoptysis.  GI: See HPI. GU : + Dark yellow urine. MS: Denies joint pain, muscles aches or weakness. Derm: Denies rash, itchiness, skin lesions or unhealing ulcers. Psych: Denies depression, anxiety or memory loss. Heme: Denies easy bruising, bleeding. Neuro:  Denies headaches, dizziness or paresthesias. Endo:  Denies any problems with DM, thyroid or adrenal function.  Physical Exam: Vital signs in last 24 hours: Temp:  [98.4 F (36.9 C)-99.1 F (37.3 C)] 98.8 F (37.1 C) (12/23 0511) Pulse Rate:  [61-84] 61 (12/23 0511) Resp:  [14-25] 17 (12/23 0511) BP: (96-164)/(57-97) 96/57 (12/23 0511) SpO2:  [95 %-98 %] 98 % (12/23 0511) Weight:  [52 kg-52.2 kg] 52 kg (12/23 0105) Last BM Date: 03/26/21 General:  Alert grossly cognitively intact 87 year old female in no acute distress. Head:  Normocephalic and atraumatic. Eyes:  No scleral icterus. Conjunctiva pink. Ears:  Normal auditory acuity. Nose:  No deformity, discharge or lesions. Mouth:  Dentition intact. No ulcers or lesions.  Neck:  Supple. No lymphadenopathy or thyromegaly.  Lungs: Breath sounds clear throughout. Heart: Regular rate and rhythm, no murmurs. Abdomen: Soft, nondistended.  Nontender.  No masses.  Positive bowel sounds all 4 quadrants. Rectal: Deferred. Musculoskeletal:  Symmetrical without gross deformities.  Pulses:  Normal  pulses noted. Extremities:  Without clubbing or edema. Neurologic:  Alert and  oriented x4. No focal deficits.  Skin:  Intact without significant lesions or rashes. Psych:  Alert and cooperative. Normal mood and affect.  Intake/Output from previous day: 12/22 0701 - 12/23 0700 In: 766.5 [I.V.:216.6; IV Piggyback:549.9] Out: 400 [Urine:400] Intake/Output this shift: No intake/output data recorded.  Lab Results: Recent Labs     03/25/21 1557 03/26/21 0338  WBC 27.4* 23.0*  HGB 12.2 9.8*  HCT 36.8 28.3*  PLT 261 184   BMET Recent Labs    03/25/21 1557 03/26/21 0338  NA 128* 126*  K 4.1 4.0  CL 98 99  CO2 23 22  GLUCOSE 176* 128*  BUN 12 11  CREATININE 0.98 0.87  CALCIUM 9.7 8.9   LFT Recent Labs    03/26/21 0338  PROT 5.1*  ALBUMIN 2.9*  AST 169*  ALT 216*  ALKPHOS 124  BILITOT 2.9*   PT/INR No results for input(s): LABPROT, INR in the last 72 hours. Hepatitis Panel No results for input(s): HEPBSAG, HCVAB, HEPAIGM, HEPBIGM in the last 72 hours.    Studies/Results: CT Abdomen Pelvis Wo Contrast  Addendum Date: 03/25/2021   ADDENDUM REPORT: 03/25/2021 23:01 ADDENDUM: Bilateral lower lobe nodules are identified as described which are slightly increased when compared with 2017. Non-contrast chest CT at 3-6 months is recommended. If the nodules are stable at time of repeat CT, then future CT at 18-24 months (from today's scan) is considered optional for low-risk patients, but is recommended for high-risk patients. This recommendation follows the consensus statement: Guidelines for Management of Incidental Pulmonary Nodules Detected on CT Images: From the Fleischner Society 2017; Radiology 2017; 284:228-243. Electronically Signed   By: Inez Catalina M.D.   On: 03/25/2021 23:01   Result Date: 03/25/2021 CLINICAL DATA:  Abdominal pain on the right, initial encounter EXAM: CT ABDOMEN AND PELVIS WITHOUT CONTRAST TECHNIQUE: Multidetector CT imaging of the abdomen and pelvis was performed following the standard protocol without IV contrast. COMPARISON:  02/23/2016 FINDINGS: Lower chest: Lung bases are free of acute infiltrate. Bilateral lower lobe nodules are noted measuring up to 9 mm on the left and 6 mm on the right. These both show slight increase in size when compared with the prior exam. The larger measured approximately 7 mm on the prior study. Hepatobiliary: Scattered hypodensities are noted  throughout the liver consistent with simple cyst. These are stable from the prior exam. Gallbladder is well distended with dependent density consistent with small stones. No wall thickening or inflammatory changes are noted. Pancreas: Unremarkable. No pancreatic ductal dilatation or surrounding inflammatory changes. Spleen: Normal in size without focal abnormality. Adrenals/Urinary Tract: Adrenal glands are within normal limits. Kidneys demonstrate no renal calculi or obstructive changes. Bladder is well distended. Findings suspicious for any anterior bladder diverticulum are noted. Stomach/Bowel: Scattered diverticular change is seen without evidence of diverticulitis. No obstructive changes of the colon are seen. The appendix has been surgically removed stomach is unremarkable. Proximal small bowel is unremarkable. There are a few loops which are mildly prominent but measure less than 3 cm in diameter. No definitive obstructive changes are seen as contrast passes distally into the proximal colon. Vascular/Lymphatic: Aortic atherosclerosis. No enlarged abdominal or pelvic lymph nodes. Reproductive: Uterus and bilateral adnexa are unremarkable. Pessary is noted in place. Other: No abdominal wall hernia or abnormality. No abdominopelvic ascites. Musculoskeletal: Degenerative changes of lumbar spine are seen with scoliosis concave to the left. IMPRESSION: Diverticulosis without diverticulitis. Mildly  prominent small bowel although measuring less than 3 cm in diameter and contrast material passes through this area without difficulty. No inflammatory changes are seen. Cholelithiasis without definitive complicating factors. Ultrasound may be helpful for further evaluation. No other focal abnormality is noted. Electronically Signed: By: Inez Catalina M.D. On: 03/25/2021 22:52   MR ABDOMEN MRCP W WO CONTAST  Result Date: 03/26/2021 CLINICAL DATA:  Jaundice. EXAM: MRI ABDOMEN WITHOUT AND WITH CONTRAST (INCLUDING MRCP)  TECHNIQUE: Multiplanar multisequence MR imaging of the abdomen was performed both before and after the administration of intravenous contrast. Heavily T2-weighted images of the biliary and pancreatic ducts were obtained, and three-dimensional MRCP images were rendered by post processing. CONTRAST:  40m GADAVIST GADOBUTROL 1 MMOL/ML IV SOLN COMPARISON:  03/25/2021. FINDINGS: Lower chest: No acute findings. Hepatobiliary: Multiple T2 hyperintensities are present in the liver, compatible with cysts. The common bile duct is distended measuring 1.2 cm in diameter. Multiple stones are identified in the mid to distal common bile duct, the largest measuring 1 cm in diameter. Stones are present within the gallbladder. Pancreas: No mass, inflammatory changes, or other parenchymal abnormality identified. Spleen:  Within normal limits in size and appearance. Adrenals/Urinary Tract: The adrenal glands are within normal limits. Bilateral renal cysts are noted. There is no hydronephrosis bilaterally. Stomach/Bowel: Visualized portions within the abdomen are unremarkable. Vascular/Lymphatic: A few prominent lymph nodes are noted anterior to the inferior vena cava in the right upper quadrant which may be reactive. The aorta is normal in caliber. Other:  No free fluid in the abdomen. Musculoskeletal: Dextroscoliosis of the lumbar spine. IMPRESSION: 1. Distended common bile duct with choledocholithiasis. ERCP is recommended for follow-up. 2. Cholelithiasis. 3. Hepatic cysts. 4. Bilateral renal cysts. Electronically Signed   By: LBrett FairyM.D.   On: 03/26/2021 01:13   UKoreaAbdomen Limited RUQ (LIVER/GB)  Result Date: 03/25/2021 CLINICAL DATA:  Right upper quadrant pain. EXAM: ULTRASOUND ABDOMEN LIMITED RIGHT UPPER QUADRANT COMPARISON:  CT abdomen 02/23/2016 FINDINGS: Gallbladder: Cholelithiasis without gallbladder wall thickening or pericholecystic fluid. Negative sonographic Murphy sign. Largest gallstone measures 7 mm. Common  bile duct: Diameter: 3 mm Liver: Normal hepatic parenchymal echogenicity. Multiple anechoic hepatic masses consistent with cysts unchanged compared with the prior examination of 02/23/2016. There is a 1.6 x 1.5 x 1.4 cm right hepatic cyst. There is a 2.6 x 2.3 x 2.3 cm right hepatic cyst. There is a 9 x 9 x 11 mm left hepatic cyst. Mild prominence of the intrahepatic biliary ducts. Portal vein is patent on color Doppler imaging with normal direction of blood flow towards the liver. Other: None. IMPRESSION: 1. Cholelithiasis without sonographic evidence of acute cholecystitis. 2. Multiple hepatic cysts. Electronically Signed   By: HKathreen DevoidM.D.   On: 03/25/2021 18:45    IMPRESSION/PLAN:  186 87year old female with known gallstones with recurrent nausea, dry heaves and upper abdominal pain which radiates up through to the chest and back admitted to the hospital 03/25/2021 with worsening symptoms.  Elevated LFTs.  Normal lipase level.  RUQ sono and CTAP identified cholelithiasis without evidence of acute cholecystitis and multiple hepatic cysts.  Abdominal MRI/MRCP w/wo contrast identified a distended CBD with choledocholithiasis.  Upper abdominal/chest/back well controlled at this time.  WBC 27.4 -> 23.0 concerning for ascending cholangitis.  She is afebrile.  Lower BP today 96/57. Evaluated by general surgery, timing of laparoscopic cholecystectomy to be determined after ERCP completed -NPO -IV fluids -Continue IV Zosyn 3.376m every 8 hours -ERCP benefits and risks  discussed including risk with sedation, risk of bleeding, perforation, pancreatitis and infection.  Timing of ERCP not yet established, possibly later this afternoon but most likely will be tomorrow -INR -Pantoprazole 40 mg p.o. daily -Ondansetron 4 mg p.o. or IV every 6 hours as needed -Pain management per the hospitalist -Repeat BP now -Await further recommendations from Dr. Lorenso Courier  2) History of colon polyps -No further colon  polyp surveillance colonoscopies recommended due to age  41) Hyponatremia          Noralyn Pick  03/26/2021, 11:08 AM

## 2021-03-26 NOTE — ED Notes (Signed)
Pt has just now returned from MRI at this time but taken directly to bed upstairs. Belongings sent: pocketbook, shoes and red jacket.

## 2021-03-26 NOTE — Progress Notes (Signed)
PROGRESS NOTE    Rebecca [name]  SWN:[id number] DOB: [birth year] DOA: 03/25/2021 PCP: [name], MD    Brief Narrative:  Rebecca [name] was admitted to the hospital with the working diagnosis of acute cholelithiasis with choledocholithiasis.   87 yo female with the past medical history of HTN, chronic hyponatremia, GERD, and hypothyroidism who presented with abdominal pain, nausea and vomiting. She was diagnosed with biliary colic due gallbladder sludge in November 2022, and referred to surgical evaluation. Her appointment was scheduled for 03/30/21. Because of persistent and worsening symptoms she presented to the hospital for more urgent evaluation. On her initial physical examination her blood pressure was 157/97, HR 84, RR 18 and oxygen saturation 95% on room air. Lungs were clear to auscultation, heart with S1 and S2 present and rhythmic, abdomen soft and non tender to palpation, no lower extremity edema.  Sodium 128, potassium 4.1, chloride 98, bicarb 23, glucose 176, BUN 12, creatinine 0.9 alkaline phosphatase 174,, AST 356, ALT 294, total bilirubin presents to clinic 69 white count 27.4, hemoglobin 12.2, hematocrit 36.8, platelets 261. Urinalysis specific gravity 1.015.  CT of the abdomen is reported reticulosis with no diverticulitis. Mild prominent small bowel. Positive cholelithiasis with no definite complicating factors.  Ultrasonography cholelithiasis without sonographic evidence of acute cholecystitis.  MRCP with distended common bile duct with choledocholithiasis.  Cholelithiasis.  Hepatic cyst, bilateral renal cysts.  Assessment & Plan:   Principal Problem:   Cholelithiasis Active Problems:   Hypothyroidism   Essential hypertension   GERD   Elevated LFTs   Chronic hyponatremia   Choledocholithiasis   Cholelithiasis complicated with choledocholithiasis. Cholangitis with severe sepsis Patient has been NPO, no nausea or vomiting, and currently no abdominal  pain.  AST 169, ALT 216, T Bil 2,9, wbc is 23.0, she has been afebrile.   Continue pain control with morphine and antiemetic therapy with zofran. Supportive IV fluids and IV antiacids. Consulted GI for ERCP.   Positive leukocytosis and hypotension, consistent with severe sepsis, cholangitis, continue antibiotic therapy with Zosyn and volume resuscitation with isotonic saline. If persistent hypotension will transfer to higher level of care.   2. Hyponatremia. Likely hypovolemic, possible chronic SIADH, will continue volume repletion with isotonic saline at 100 ml per hr. Continue close follow up of renal function and electrolytes.   3. Hypothyroid. Continue with levothyroxine  4. HTN. Hypotension with blood pressure systolic down to 96 mmHg. Continue to hold on antihypertensive medications. Bolus NS 500 cc x1 and continue saline at 100 ml per hr   5. GERD. Continue with antiacid therapy   Patient continue to be at high risk for  worsening choledocholithiasis and cholecystitis.   Status is: Observation  The patient will require care spanning > 2 midnights and should be moved to inpatient because: IV antibiotics and advance endoscopic intervention   DVT prophylaxis:  Enoxaparin   Code Status:    full  Family Communication:   No family at the bedside     Consultants:  Surgery  GI   Antimicrobials:  Zosyn     Subjective: Patient with no nausea or vomiting, she has been NPO and no current abdominal pain,. No dyspnea or chest pain.,   Objective: Vitals:   03/25/21 2112 03/25/21 2300 03/26/21 0105 03/26/21 0511  BP: 132/85 126/66 122/62 (!) 96/57  Pulse: 79 72 70 61  Resp: 14 15 16 17   Temp:   99.1 F (37.3 C) 98.8 F (37.1 C)  TempSrc:   Oral Oral  SpO2:  97% 95% 97% 98%  Weight:   52 kg   Height:   5' 2.5" (1.588 m)     Intake/Output Summary (Last 24 hours) at 03/26/2021 0900 Last data filed at 03/26/2021 0700 Gross per 24 hour  Intake 766.5 ml  Output 400 ml   Net 366.5 ml   Filed Weights   03/25/21 1543 03/26/21 0105  Weight: 52.2 kg 52 kg    Examination:   General: Not in pain or dyspnea. Deconditioned  Neurology: Awake and alert, non focal  E ENT: no pallor, no icterus, oral mucosa moist Cardiovascular: No JVD. S1-S2 present, rhythmic, no gallops, rubs, or murmurs. No lower extremity edema. Pulmonary: positive breath sounds bilaterally, with no wheezing, rhonchi or rales. Gastrointestinal. Abdomen soft and non tender. Not distended and no rebound or guarding Skin. No rashes Musculoskeletal: no joint deformities     Data Reviewed: I have personally reviewed following labs and imaging studies  CBC: Recent Labs  Lab 03/25/21 1557 03/26/21 0338  WBC 27.4* 23.0*  NEUTROABS 8.3* 7.7  HGB 12.2 9.8*  HCT 36.8 28.3*  MCV 100.8* 99.3  PLT 261 222   Basic Metabolic Panel: Recent Labs  Lab 03/25/21 1557 03/26/21 0338  NA 128* 126*  K 4.1 4.0  CL 98 99  CO2 23 22  GLUCOSE 176* 128*  BUN 12 11  CREATININE 0.98 0.87  CALCIUM 9.7 8.9   GFR: Estimated Creatinine Clearance: 37.6 mL/min (by C-G formula based on SCr of 0.87 mg/dL). Liver Function Tests: Recent Labs  Lab 03/25/21 1557 03/26/21 0338  AST 356* 169*  ALT 294* 216*  ALKPHOS 174* 124  BILITOT 2.6* 2.9*  PROT 6.8 5.1*  ALBUMIN 3.9 2.9*   Recent Labs  Lab 03/25/21 1557  LIPASE 26   No results for input(s): AMMONIA in the last 168 hours. Coagulation Profile: No results for input(s): INR, PROTIME in the last 168 hours. Cardiac Enzymes: No results for input(s): CKTOTAL, CKMB, CKMBINDEX, TROPONINI in the last 168 hours. BNP (last 3 results) No results for input(s): PROBNP in the last 8760 hours. HbA1C: No results for input(s): HGBA1C in the last 72 hours. CBG: No results for input(s): GLUCAP in the last 168 hours. Lipid Profile: No results for input(s): CHOL, HDL, LDLCALC, TRIG, CHOLHDL, LDLDIRECT in the last 72 hours. Thyroid Function Tests: Recent  Labs    03/26/21 0338  TSH 0.766  FREET4 1.43*   Anemia Panel: No results for input(s): VITAMINB12, FOLATE, FERRITIN, TIBC, IRON, RETICCTPCT in the last 72 hours.    Radiology Studies: I have reviewed all of the imaging during this hospital visit personally     Scheduled Meds:  levothyroxine  88 mcg Oral Q0600   losartan  100 mg Oral Daily   mupirocin ointment  1 application Nasal BID   ondansetron (ZOFRAN) IV  4 mg Intravenous Once   pantoprazole  40 mg Oral Daily   Continuous Infusions:  sodium chloride 50 mL/hr at 03/26/21 0116   piperacillin-tazobactam (ZOSYN)  IV 3.375 g (03/26/21 0152)     LOS: 0 days        Isaic Syler Gerome Apley, MD

## 2021-03-26 NOTE — Progress Notes (Signed)
Transition of Care [facility]) Screening Note  Patient Details  Name: Rebecca [name] Date of Birth: 1932-05-11  Transition of Care [facility]) CM/SW Contact:    Sherie Don, LCSW Phone Number: 03/26/2021, 9:49 AM  Transition of Care Department [facility]) has reviewed patient and no TOC needs have been identified at this time. We will continue to monitor patient advancement through interdisciplinary progression rounds. If new patient transition needs arise, please place a TOC consult.

## 2021-03-26 NOTE — Progress Notes (Signed)
MRCP shows choledocholithiasis. Will need GI consult and ERCP. Pt has been with [facility] in the past. Will start IV zosyn due to biliary obstruction.

## 2021-03-26 NOTE — Progress Notes (Signed)
Initial Nutrition Assessment  DOCUMENTATION CODES:  Non-severe (moderate) malnutrition in context of acute illness/injury  INTERVENTION:  Advance diet as medically able and as tolerated.  Add Boost Plus po TID, each supplement provides 360 kcal and 14 grams of protein.  Add MVI with minerals daily.  Encourage PO and supplement intake.  NUTRITION DIAGNOSIS:  Moderate Malnutrition related to acute illness (biliary colic d/t gallbladder sludge) as evidenced by moderate fat depletion, moderate muscle depletion.  GOAL:  Patient will meet greater than or equal to 90% of their needs  MONITOR:  Diet advancement, PO intake, Supplement acceptance, Labs, Weight trends, I & O's  REASON FOR ASSESSMENT:  Malnutrition Screening Tool    ASSESSMENT:  87 yo female with a PMH of  HTN, chronic hyponatremia, GERD, and hypothyroidism who presented with abdominal pain, nausea and vomiting. She was diagnosed with biliary colic due gallbladder sludge in November 2022, and referred to surgical evaluation. Her appointment was scheduled for 03/30/21. Because of persistent and worsening symptoms she presented to the hospital for more urgent evaluation.  Spoke with pt at bedside.  Pt reports being very hungry and asking for food. She has been following a low fat diet at home for the past month, eating 3 meals daily (breakfast of eggs/toast and lunch/dinner of chicken/potatoes/green beans).    She reports an 8 lb weight loss in the past 1 month. Per Epic, pt's weight has remained stable over the past 14 months.  Medications: reviewed; Protonix, Synthroid, NaCl in D5 @ 75 ml/hr  Labs: reviewed; Na 126 (L), Glucose 128 (H)  NUTRITION - FOCUSED PHYSICAL EXAM: Flowsheet Row Most Recent Value  Orbital Region Mild depletion  Upper Arm Region Moderate depletion  Thoracic and Lumbar Region Mild depletion  Buccal Region Moderate depletion  Temple Region Moderate depletion  Clavicle Bone Region Moderate  depletion  Clavicle and Acromion Bone Region Moderate depletion  Scapular Bone Region Unable to assess  Dorsal Hand Moderate depletion  Patellar Region Severe depletion  Anterior Thigh Region Severe depletion  Posterior Calf Region Severe depletion  Edema (RD Assessment) None  Hair Reviewed  Eyes Reviewed  Mouth Reviewed  Skin Reviewed  Nails Reviewed   Diet Order:   Diet Order             Diet NPO time specified Except for: Sips with Meds  Diet effective now                  EDUCATION NEEDS:  Education needs have been addressed  Skin:  Skin Assessment: Reviewed RN Assessment  Last BM:  03/26/21  Height:  Ht Readings from Last 1 Encounters:  03/26/21 5' 2.5" (1.588 m)   Weight:  Wt Readings from Last 1 Encounters:  03/26/21 52 kg   BMI:  Body mass index is 20.63 kg/m.  Estimated Nutritional Needs:  Kcal:  1800-2000 Protein:  75-90 grams Fluid:  >1.8 L  Derrel Nip, RD, LDN (she/her/hers) Clinical Inpatient Dietitian RD Pager/After-Hours/Weekend Pager # in [place]

## 2021-03-26 NOTE — Progress Notes (Signed)
Patient's Son- Rayssa Atha- asked to receive any updates on any new plans concerning ERCP/ surgery for patient. His number is [phone].

## 2021-03-26 NOTE — Progress Notes (Signed)
Pharmacy Antibiotic Note  Rebecca [name] is a 87 y.o. female admitted on 03/25/2021 with choledocholithiasis.  Pharmacy has been consulted for Zosyn dosing for biliary obstruction.  Plan: Zosyn 3.375g IV q8h (4 hour infusion). Need for further dosage adjustment appears unlikely at present.    Will sign off at this time.  Please reconsult if a change in clinical status warrants re-evaluation of dosage.    Height: 5' 2.5" (158.8 cm) Weight: 52 kg (114 lb 10.2 oz) IBW/kg (Calculated) : 51.25  Temp (24hrs), Avg:98.8 F (37.1 C), Min:98.4 F (36.9 C), Max:99.1 F (37.3 C)  Recent Labs  Lab 03/25/21 1557  WBC 27.4*  CREATININE 0.98    Estimated Creatinine Clearance: 33.4 mL/min (by C-G formula based on SCr of 0.98 mg/dL).    Allergies  Allergen Reactions   Levaquin [Levofloxacin] Other (See Comments)    Very nauseous and shakey   Sulfamethoxazole-Trimethoprim Nausea Only    shakey   Lactose Intolerance (Gi) Diarrhea and Other (See Comments)    Stomach hurts    Tramadol Hcl Nausea Only and Palpitations   Tramadol Hcl Nausea Only and Palpitations   Augmentin [Amoxicillin-Pot Clavulanate] Diarrhea   Ciprofloxacin Nausea Only    REACTION: Thrush, Nausea, Shakey   Pneumococcal Vaccine Polyvalent     REACTION: RED AND RAISED RASH ON ARM   Adhesive [Tape] Rash   Codeine Nausea Only     Thank you for allowing pharmacy to be a part of this patients care.  Everette Rank, PharmD 03/26/2021 1:37 AM

## 2021-03-26 NOTE — Consult Note (Signed)
Referring Provider: Dr. Sander Radon  Primary Care Physician:  [name], MD Primary Gastroenterologist:  Dr. Lucio Edward  Reason for Consultation:  Nausea, upper abdominal pain, elevated LFTs with gallstones and choledocholithiasis  HPI: Rebecca [name] is a 87 y.o. female with a past medical history of anxiety, hypertension, hyperlipidemia, gallstones, GERD, gastroparesis, IBS, segmental colitis and colon polyps.  She has a history of ongoing gallbladder attacks for the past 2 months with jaundice brief jaundice 02/2021 followed by her PCP.  She was scheduled to see general surgeon Dr. Zenia Resides on 03/30/2021 to discuss outpatient laparoscopic cholecystectomy. However, she developed upper abdominal pain which radiated into her chest with nausea.  She called 911 and she was transported to [facility] ED 03/25/2021 for further evaluation. Labs in the ED showed a sodium level of 128.  Potassium 4.1.  BUN 12.  Creatinine 0.98.  Alk phos 174.  Total bili 2.6.  AST 356.  ALT 294.  WBC 27.4.  ANC 8.3.  Hemoglobin 12.2.  MCV 100.8.  Platelet 261.  Acute hepatitis panel negative.  INR not done.  SARS coronavirus 2 negative.  A twelve-lead EKG without acute ischemia, NSR with a RBBB. RUQ sono showed cholelithiasis without evidence of cholecystitis, mild prominence of the intrahepatic biliary ducts and multiple hepatic cysts.  CTAP w/o contrast showed cholelithiasis without biliary dilatation with a normal pancreas with a few loops of the small bowel were mildly prominent without evidence of an obstruction.  An abdominal MRI/MRCP w/wo showed a distended CBD with choledocholithiasis.  A GI consult was requested for further GI evaluation and possible ERCP.  She reports having episodes of epigastric pain which radiates to the RUQ and LUQ and up through her chest which typically occurs 3 to 4 hours after eating meals on and off for the past few months. No vomiting, just dry heaves. She awakened  shortly after midnight 03/25/2021 with central upper abdominal pain which radiated up through her chest and mid back with nausea, dry heaves without vomiting.  Her urine has been darker yellow for the past few days which she has also noticed intermittently over the past few months.  She is passing a normal formed brown bowel movement daily.  No acholic stools.  No pruritus.  She has lost about 8 pounds over the past 2 to 3 months.  No fever, sweats or chills.  No NSAID use.  She is not on any antiplatelet or anticoagulants.  No dysphagia or heartburn.  She takes Esomeprazole 20 mg p.o. twice daily.  No history of cardiac or pulmonary disease.  She lives by herself.  She has 1 daughter and 2 sons who live nearby.  Hepatic panel 02/10/2021: Total bili 3.7.  Alk phos 154.  AST 67.  ALT 84. Hepatic panel 02/17/2021: Total bili 1.3.  Direct bili 0.4.  Alk phos 123.  AST 27.  ALT 77.  Abdominal MRI/MRCP w/wo contrast 03/26/2021: 1. Distended common bile duct with choledocholithiasis. ERCP is recommended for follow-up. 2. Cholelithiasis. 3. Hepatic cysts. 4. Bilateral renal cysts.  CTAP without contrast 03/25/2021 Diverticulosis without diverticulitis.   Mildly prominent small bowel although measuring less than 3 cm in diameter and contrast material passes through this area without difficulty. No inflammatory changes are seen.   Cholelithiasis without definitive complicating factors. Ultrasound may be helpful for further evaluation. No other focal abnormality is noted  RUQ sonogram 03/25/2021: Gallbladder:Cholelithiasis without gallbladder wall thickening or pericholecystic fluid. Negative sonographic Murphy sign. Largest gallstone measures  7 mm.  Common bile duct:Diameter: 3 mm  Liver:Normal hepatic parenchymal echogenicity. Multiple anechoic hepatic masses consistent with cysts unchanged compared with the prior examination of 02/23/2016. There is a 1.6 x 1.5 x 1.4 cm right hepatic cyst.  There is a 2.6 x 2.3 x 2.3 cm right hepatic cyst. There is a 9 x 9 x 11 mm left hepatic cyst. Mild prominence of the intrahepatic biliary ducts. Portal vein is patent on color Doppler imaging with normal direction of blood flow towards the liver.    IMPRESSION: 1. Cholelithiasis without sonographic evidence of acute cholecystitis. 2. Multiple hepatic cysts.  RUQ sono 02/15/2021: 1. Heterogeneous, masslike material in the gallbladder favored to reflect tumefactive sludge. 2. Mild intrahepatic biliary dilatation. 3. Multiple hepatic cysts.  Colonoscopy 12/15/2014: 1. Stricture assoicated with diverticulosis in the sigmoid colon; multiple biopsies performed 2. Sessile polyp was found in the transverse colon; polypectomy was performed with a cold snare 3. Grade l internal hemorrhoids 1. Surgical [P], sigmoid biopsy - DENSELY INFLAMED POLYPOID COLORECTAL MUCOSA. - THERE IS NO EVIDENCE OF MALIGNANCY. 2. Surgical [P], transverse, polyp - TUBULAR ADENOMA(S). - HIGH GRADE DYSPLASIA IS NOT IDENTIFIED.  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[place])    Tubular adenoma of colon 05/2009   Past Surgical History:  Procedure Laterality Date   APPENDECTOMY     ARCUATE KERATECTOMY     CATARACT EXTRACTION, BILATERAL  2015   double pallital tori bone removed      HERNIA REPAIR     umbilical   KNEE SURGERY Bilateral    morton's neuroma on rt foot and platar facial release     MOUTH SURGERY     "bone shaved off roof of mouth"   SALIVARY GLAND SURGERY     removal   TONSILLECTOMY      Prior to Admission medications   Medication Sig Start Date End Date Taking? Authorizing Provider  ALPRAZolam (XANAX)  0.25 MG tablet TAKE 1 TABLET(0.25 MG) BY MOUTH AT BEDTIME AS NEEDED Patient taking differently: Take 0.25 mg by mouth at bedtime as needed for sleep. 11/30/20   Burchette, Alinda Sierras, MD  Calcium Carb-Cholecalciferol (CALCIUM-VITAMIN D) 600-400 MG-UNIT TABS Take 1 tablet by mouth daily.    [provider]  Cholecalciferol (VITAMIN D3) 2000 UNITS TABS Take 1 tablet by mouth daily.    [provider]  diclofenac sodium (VOLTAREN) 1 % GEL Apply 2 g topically 4 (four) times daily. 08/09/17   Burchette, Alinda Sierras, MD  esomeprazole (NEXIUM) 20 MG capsule Take 20 mg by mouth 2 (two) times daily before a meal.    [provider]  latanoprost (XALATAN) 0.005 % ophthalmic solution Place 1 drop into both eyes at bedtime. 01/04/21   [provider]  levothyroxine (SYNTHROID) 88 MCG tablet TAKE 1 TABLET(88 MCG) BY MOUTH DAILY Patient taking differently: Take 88 mcg by mouth daily before breakfast. 12/25/20   Burchette, Alinda Sierras, MD  losartan (COZAAR) 100 MG tablet TAKE 1 TABLET BY MOUTH EVERY DAY Patient taking differently: Take 100 mg by mouth daily. 12/28/20   Burchette, Alinda Sierras, MD  metoCLOPramide (REGLAN) 5 MG tablet TAKE 1 TABLET BY MOUTH TWICE DAILY WITH MEALS Patient taking differently: Take 5 mg by mouth 2 (two) times daily with a meal. 02/19/21   Burchette, Alinda Sierras, MD  ondansetron (ZOFRAN-ODT) 4 MG disintegrating tablet DISSOLVE 1 TABLET(4 MG) ON THE TONGUE EVERY 8 HOURS AS NEEDED FOR NAUSEA OR VOMITING Patient taking differently: Take 4 mg by mouth every 8 (eight) hours as needed for nausea or vomiting. 03/22/21   Burchette, Alinda Sierras, MD  rosuvastatin (CRESTOR) 20 MG tablet TAKE 1 TABLET(20 MG) BY MOUTH DAILY Patient taking differently: Take 20 mg by mouth daily. 12/18/20   Burchette, Alinda Sierras, MD  timolol (TIMOPTIC) 0.5 % ophthalmic solution Place 1 drop into both eyes 2 (two) times daily. 09/08/20   [provider]  vitamin B-12 (CYANOCOBALAMIN) 500 MCG tablet Take 1,000  mcg by mouth every morning.    [provider]  vitamin C (ASCORBIC ACID) 500 MG tablet Take 500 mg by mouth daily.    [provider]  olmesartan (BENICAR) 20 MG tablet Take 20 mg by mouth daily.    07/28/11  [provider]    Current Facility-Administered Medications  Medication Dose Route Frequency Provider Last Rate Last Admin   0.9 %  sodium chloride infusion   Intravenous Continuous Kristopher Oppenheim, DO 50 mL/hr at 03/26/21 0116 New Bag at 03/26/21 0116   acetaminophen (TYLENOL) tablet 650 mg  650 mg Oral Q6H PRN Kristopher Oppenheim, DO       Or   acetaminophen (TYLENOL) suppository 650 mg  650 mg Rectal Q6H PRN Kristopher Oppenheim, DO       ALPRAZolam Duanne Moron) tablet 0.25 mg  0.25 mg Oral QHS PRN Kristopher Oppenheim, DO       levothyroxine (SYNTHROID) tablet 88 mcg  88 mcg Oral Q0600 Kristopher Oppenheim, DO       losartan (COZAAR) tablet 100 mg  100 mg Oral Daily Kristopher Oppenheim, DO       mupirocin ointment (BACTROBAN) 2 % 1 application  1 application Nasal BID Kristopher Oppenheim, DO       ondansetron [facility]) injection 4 mg  4 mg Intravenous Once Kristopher Oppenheim, DO       ondansetron [facility]) tablet 4 mg  4 mg Oral Q6H PRN Kristopher Oppenheim, DO       Or   ondansetron [facility]) injection 4 mg  4 mg Intravenous Q6H PRN Kristopher Oppenheim, DO       pantoprazole (PROTONIX) EC tablet 40 mg  40 mg Oral Daily Kristopher Oppenheim, DO       piperacillin-tazobactam (ZOSYN) IVPB 3.375 g  3.375 g Intravenous Q8H Poindexter, Leann T, RPH 12.5 mL/hr at 03/26/21 0152 3.375 g at 03/26/21 0152    Allergies as of 03/25/2021 - Review Complete 03/25/2021  Allergen Reaction Noted   Levaquin [levofloxacin] Other (See Comments) 12/23/2014   Sulfamethoxazole-trimethoprim Nausea Only 03/03/2009   Lactose intolerance (gi) Diarrhea and Other (See Comments) 02/07/2013   Tramadol hcl Nausea Only and Palpitations    Tramadol hcl Nausea Only and Palpitations 11/27/2018   Augmentin [amoxicillin-pot clavulanate] Diarrhea 03/02/2016   Ciprofloxacin Nausea Only     Pneumococcal vaccine polyvalent  07/28/2008   Adhesive [tape] Rash 04/20/2011   Codeine Nausea Only     Family History  Problem Relation Age of Onset   Diabetes Mother    Heart disease Mother    Hyperlipidemia Mother    Hypertension Mother    Anuerysm Father    Heart disease  Father    Hypertension Father    Migraines Daughter    Kidney Stones Daughter    Colon polyps Sister    Lung cancer Sister    Colon polyps Brother    Diabetes Brother    Hyperlipidemia Brother    Hypertension Brother    Colon polyps Daughter    Breast cancer Daughter    Colon cancer Maternal Aunt        Rectal cancer   Cancer Daughter        salivary   Colon cancer Paternal Grandmother        with possible stomach cancer   Heart disease Brother    Crohn's disease Neg Hx    Pancreatic cancer Neg Hx     Social History   Socioeconomic History   Marital status: Widowed    Spouse name: Not on file   Number of children: 3   Years of education: Not on file   Highest education level: Not on file  Occupational History   Occupation: Glass blower/designer    Comment: retired  Tobacco Use   Smoking status: Never   Smokeless tobacco: Never  Vaping Use   Vaping Use: Never used  Substance and Sexual Activity   Alcohol use: Yes    Alcohol/week: 0.0 standard drinks    Comment: rare   Drug use: No   Sexual activity: Yes  Other Topics Concern   Not on file  Social History Narrative   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 CV: Denies chest pain, palpitations or edema. Resp: Denies cough, shortness of breath of hemoptysis.  GI: See HPI. GU : + Dark yellow urine. MS: Denies joint pain, muscles aches or weakness. Derm: Denies rash, itchiness, skin lesions or unhealing ulcers. Psych: Denies depression, anxiety or memory loss. Heme: Denies easy bruising, bleeding. Neuro:  Denies headaches, dizziness or paresthesias. Endo:  Denies any problems with DM, thyroid or adrenal function.  Physical Exam: Vital signs in last 24 hours: Temp:  [98.4 F (36.9 C)-99.1 F (37.3 C)] 98.8 F (37.1 C) (12/23 0511) Pulse Rate:  [61-84] 61 (12/23 0511) Resp:  [14-25] 17 (12/23 0511) BP: (96-164)/(57-97) 96/57 (12/23 0511) SpO2:  [95 %-98 %] 98 % (12/23 0511) Weight:  [52 kg-52.2 kg] 52 kg (12/23 0105) Last BM Date: 03/26/21 General:  Alert grossly cognitively intact 87 year old female in no acute distress. Head:  Normocephalic and atraumatic. Eyes:  No scleral icterus. Conjunctiva pink. Ears:  Normal auditory acuity. Nose:  No deformity, discharge or lesions. Mouth:  Dentition intact. No ulcers or lesions.  Neck:  Supple. No lymphadenopathy or thyromegaly.  Lungs: Breath sounds clear throughout. Heart: Regular rate and rhythm, no murmurs. Abdomen: Soft, nondistended.  Nontender.  No masses.  Positive bowel sounds all 4 quadrants. Rectal: Deferred. Musculoskeletal:  Symmetrical without gross deformities.  Pulses:  Normal  pulses noted. Extremities:  Without clubbing or edema. Neurologic:  Alert and  oriented x4. No focal deficits.  Skin:  Intact without significant lesions or rashes. Psych:  Alert and cooperative. Normal mood and affect.  Intake/Output from previous day: 12/22 0701 - 12/23 0700 In: 766.5 [I.V.:216.6; IV Piggyback:549.9] Out: 400 [Urine:400] Intake/Output this shift: No intake/output data recorded.  Lab Results: Recent Labs     03/25/21 1557 03/26/21 0338  WBC 27.4* 23.0*  HGB 12.2 9.8*  HCT 36.8 28.3*  PLT 261 184   BMET Recent Labs    03/25/21 1557 03/26/21 0338  NA 128* 126*  K 4.1 4.0  CL 98 99  CO2 23 22  GLUCOSE 176* 128*  BUN 12 11  CREATININE 0.98 0.87  CALCIUM 9.7 8.9   LFT Recent Labs    03/26/21 0338  PROT 5.1*  ALBUMIN 2.9*  AST 169*  ALT 216*  ALKPHOS 124  BILITOT 2.9*   PT/INR No results for input(s): LABPROT, INR in the last 72 hours. Hepatitis Panel No results for input(s): HEPBSAG, HCVAB, HEPAIGM, HEPBIGM in the last 72 hours.    Studies/Results: CT Abdomen Pelvis Wo Contrast  Addendum Date: 03/25/2021   ADDENDUM REPORT: 03/25/2021 23:01 ADDENDUM: Bilateral lower lobe nodules are identified as described which are slightly increased when compared with 2017. Non-contrast chest CT at 3-6 months is recommended. If the nodules are stable at time of repeat CT, then future CT at 18-24 months (from today's scan) is considered optional for low-risk patients, but is recommended for high-risk patients. This recommendation follows the consensus statement: Guidelines for Management of Incidental Pulmonary Nodules Detected on CT Images: From the Fleischner Society 2017; Radiology 2017; 284:228-243. Electronically Signed   By: Inez Catalina M.D.   On: 03/25/2021 23:01   Result Date: 03/25/2021 CLINICAL DATA:  Abdominal pain on the right, initial encounter EXAM: CT ABDOMEN AND PELVIS WITHOUT CONTRAST TECHNIQUE: Multidetector CT imaging of the abdomen and pelvis was performed following the standard protocol without IV contrast. COMPARISON:  02/23/2016 FINDINGS: Lower chest: Lung bases are free of acute infiltrate. Bilateral lower lobe nodules are noted measuring up to 9 mm on the left and 6 mm on the right. These both show slight increase in size when compared with the prior exam. The larger measured approximately 7 mm on the prior study. Hepatobiliary: Scattered hypodensities are noted  throughout the liver consistent with simple cyst. These are stable from the prior exam. Gallbladder is well distended with dependent density consistent with small stones. No wall thickening or inflammatory changes are noted. Pancreas: Unremarkable. No pancreatic ductal dilatation or surrounding inflammatory changes. Spleen: Normal in size without focal abnormality. Adrenals/Urinary Tract: Adrenal glands are within normal limits. Kidneys demonstrate no renal calculi or obstructive changes. Bladder is well distended. Findings suspicious for any anterior bladder diverticulum are noted. Stomach/Bowel: Scattered diverticular change is seen without evidence of diverticulitis. No obstructive changes of the colon are seen. The appendix has been surgically removed stomach is unremarkable. Proximal small bowel is unremarkable. There are a few loops which are mildly prominent but measure less than 3 cm in diameter. No definitive obstructive changes are seen as contrast passes distally into the proximal colon. Vascular/Lymphatic: Aortic atherosclerosis. No enlarged abdominal or pelvic lymph nodes. Reproductive: Uterus and bilateral adnexa are unremarkable. Pessary is noted in place. Other: No abdominal wall hernia or abnormality. No abdominopelvic ascites. Musculoskeletal: Degenerative changes of lumbar spine are seen with scoliosis concave to the left. IMPRESSION: Diverticulosis without diverticulitis. Mildly  prominent small bowel although measuring less than 3 cm in diameter and contrast material passes through this area without difficulty. No inflammatory changes are seen. Cholelithiasis without definitive complicating factors. Ultrasound may be helpful for further evaluation. No other focal abnormality is noted. Electronically Signed: By: Inez Catalina M.D. On: 03/25/2021 22:52   MR ABDOMEN MRCP W WO CONTAST  Result Date: 03/26/2021 CLINICAL DATA:  Jaundice. EXAM: MRI ABDOMEN WITHOUT AND WITH CONTRAST (INCLUDING MRCP)  TECHNIQUE: Multiplanar multisequence MR imaging of the abdomen was performed both before and after the administration of intravenous contrast. Heavily T2-weighted images of the biliary and pancreatic ducts were obtained, and three-dimensional MRCP images were rendered by post processing. CONTRAST:  48m GADAVIST GADOBUTROL 1 MMOL/ML IV SOLN COMPARISON:  03/25/2021. FINDINGS: Lower chest: No acute findings. Hepatobiliary: Multiple T2 hyperintensities are present in the liver, compatible with cysts. The common bile duct is distended measuring 1.2 cm in diameter. Multiple stones are identified in the mid to distal common bile duct, the largest measuring 1 cm in diameter. Stones are present within the gallbladder. Pancreas: No mass, inflammatory changes, or other parenchymal abnormality identified. Spleen:  Within normal limits in size and appearance. Adrenals/Urinary Tract: The adrenal glands are within normal limits. Bilateral renal cysts are noted. There is no hydronephrosis bilaterally. Stomach/Bowel: Visualized portions within the abdomen are unremarkable. Vascular/Lymphatic: A few prominent lymph nodes are noted anterior to the inferior vena cava in the right upper quadrant which may be reactive. The aorta is normal in caliber. Other:  No free fluid in the abdomen. Musculoskeletal: Dextroscoliosis of the lumbar spine. IMPRESSION: 1. Distended common bile duct with choledocholithiasis. ERCP is recommended for follow-up. 2. Cholelithiasis. 3. Hepatic cysts. 4. Bilateral renal cysts. Electronically Signed   By: LBrett FairyM.D.   On: 03/26/2021 01:13   UKoreaAbdomen Limited RUQ (LIVER/GB)  Result Date: 03/25/2021 CLINICAL DATA:  Right upper quadrant pain. EXAM: ULTRASOUND ABDOMEN LIMITED RIGHT UPPER QUADRANT COMPARISON:  CT abdomen 02/23/2016 FINDINGS: Gallbladder: Cholelithiasis without gallbladder wall thickening or pericholecystic fluid. Negative sonographic Murphy sign. Largest gallstone measures 7 mm. Common  bile duct: Diameter: 3 mm Liver: Normal hepatic parenchymal echogenicity. Multiple anechoic hepatic masses consistent with cysts unchanged compared with the prior examination of 02/23/2016. There is a 1.6 x 1.5 x 1.4 cm right hepatic cyst. There is a 2.6 x 2.3 x 2.3 cm right hepatic cyst. There is a 9 x 9 x 11 mm left hepatic cyst. Mild prominence of the intrahepatic biliary ducts. Portal vein is patent on color Doppler imaging with normal direction of blood flow towards the liver. Other: None. IMPRESSION: 1. Cholelithiasis without sonographic evidence of acute cholecystitis. 2. Multiple hepatic cysts. Electronically Signed   By: HKathreen DevoidM.D.   On: 03/25/2021 18:45    IMPRESSION/PLAN:  [age]year old female with known gallstones with recurrent nausea, dry heaves and upper abdominal pain which radiates up through to the chest and back admitted to the hospital 03/25/2021 with worsening symptoms.  Elevated LFTs.  Normal lipase level.  RUQ sono and CTAP identified cholelithiasis without evidence of acute cholecystitis and multiple hepatic cysts.  Abdominal MRI/MRCP w/wo contrast identified a distended CBD with choledocholithiasis.  Upper abdominal/chest/back well controlled at this time.  WBC 27.4 -> 23.0 concerning for ascending cholangitis.  She is afebrile.  Lower BP today 96/57. Evaluated by general surgery, timing of laparoscopic cholecystectomy to be determined after ERCP completed -NPO -IV fluids -Continue IV Zosyn 3.3788m every 8 hours -ERCP benefits and risks  discussed including risk with sedation, risk of bleeding, perforation, pancreatitis and infection.  Timing of ERCP not yet established, possibly later this afternoon but most likely will be tomorrow -INR -Pantoprazole 40 mg p.o. daily -Ondansetron 4 mg p.o. or IV every 6 hours as needed -Pain management per the hospitalist -Repeat BP now -Await further recommendations from Dr. Lorenso Courier  2) History of colon polyps -No further colon  polyp surveillance colonoscopies recommended due to age  61) Hyponatremia          Rebecca [name]  03/26/2021, 11:08 AM

## 2021-03-27 ENCOUNTER — Inpatient Hospital Stay (HOSPITAL_COMMUNITY): Payer: Medicare Other

## 2021-03-27 ENCOUNTER — Inpatient Hospital Stay (HOSPITAL_COMMUNITY): Payer: Medicare Other | Admitting: Anesthesiology

## 2021-03-27 ENCOUNTER — Encounter (HOSPITAL_COMMUNITY): Payer: Self-pay | Admitting: Internal Medicine

## 2021-03-27 ENCOUNTER — Encounter (HOSPITAL_COMMUNITY)
Admission: EM | Disposition: A | Discharge status: Home / Self Care | Payer: Self-pay | Source: Home / Self Care | Attending: Internal Medicine

## 2021-03-27 DIAGNOSIS — K8050 Calculus of bile duct without cholangitis or cholecystitis without obstruction: Secondary | ICD-10-CM | POA: Diagnosis not present

## 2021-03-27 DIAGNOSIS — E440 Moderate protein-calorie malnutrition: Secondary | ICD-10-CM | POA: Insufficient documentation

## 2021-03-27 HISTORY — PX: REMOVAL OF STONES: SHX5545

## 2021-03-27 HISTORY — PX: SPHINCTEROTOMY: SHX5279

## 2021-03-27 HISTORY — PX: ERCP: SHX5425

## 2021-03-27 HISTORY — PX: BILIARY DILATION: SHX6850

## 2021-03-27 LAB — CBC
HCT: 29.1 % — ABNORMAL LOW (ref 36.0–46.0)
Hemoglobin: 9.6 g/dL — ABNORMAL LOW (ref 12.0–15.0)
MCH: 33.9 pg (ref 26.0–34.0)
MCHC: 33 g/dL (ref 30.0–36.0)
MCV: 102.8 fL — ABNORMAL HIGH (ref 80.0–100.0)
Platelets: 154 10*3/uL (ref 150–400)
RBC: 2.83 MIL/uL — ABNORMAL LOW (ref 3.87–5.11)
RDW: 13.2 % (ref 11.5–15.5)
WBC: 10.8 10*3/uL — ABNORMAL HIGH (ref 4.0–10.5)
nRBC: 0 % (ref 0.0–0.2)

## 2021-03-27 LAB — COMPREHENSIVE METABOLIC PANEL
ALT: 134 U/L — ABNORMAL HIGH (ref 0–44)
AST: 69 U/L — ABNORMAL HIGH (ref 15–41)
Albumin: 3 g/dL — ABNORMAL LOW (ref 3.5–5.0)
Alkaline Phosphatase: 117 U/L (ref 38–126)
Anion gap: 7 (ref 5–15)
BUN: 10 mg/dL (ref 8–23)
CO2: 22 mmol/L (ref 22–32)
Calcium: 8.9 mg/dL (ref 8.9–10.3)
Chloride: 106 mmol/L (ref 98–111)
Creatinine, Ser: 0.86 mg/dL (ref 0.44–1.00)
GFR, Estimated: >60 mL/min (ref >60)
Glucose, Bld: 127 mg/dL — ABNORMAL HIGH (ref 70–99)
Potassium: 3.3 mmol/L — ABNORMAL LOW (ref 3.5–5.1)
Sodium: 135 mmol/L (ref 135–145)
Total Bilirubin: 1 mg/dL (ref 0.3–1.2)
Total Protein: 5.2 g/dL — ABNORMAL LOW (ref 6.5–8.1)

## 2021-03-27 SURGERY — ERCP, WITH INTERVENTION IF INDICATED
Anesthesia: General

## 2021-03-27 MED ORDER — PANTOPRAZOLE SODIUM 40 MG PO TBEC
40.0000 mg | DELAYED_RELEASE_TABLET | Freq: Every day | ORAL | Status: DC
  Administered 2021-03-28 – 2021-04-01 (×5): 40 mg via ORAL
  Filled 2021-03-27 (×6): qty 1

## 2021-03-27 MED ORDER — TIMOLOL MALEATE 0.5 % OP SOLN
1.0000 [drp] | Freq: Two times a day (BID) | OPHTHALMIC | Status: DC
  Administered 2021-03-27 – 2021-03-31 (×3): 1 [drp] via OPHTHALMIC
  Filled 2021-03-27: qty 5

## 2021-03-27 MED ORDER — LIDOCAINE 2% (20 MG/ML) 5 ML SYRINGE
INTRAMUSCULAR | Status: DC | PRN
  Administered 2021-03-27: 60 mg via INTRAVENOUS

## 2021-03-27 MED ORDER — PROPOFOL 10 MG/ML IV BOLUS
INTRAVENOUS | Status: DC | PRN
  Administered 2021-03-27: 120 mg via INTRAVENOUS

## 2021-03-27 MED ORDER — DEXAMETHASONE SODIUM PHOSPHATE 10 MG/ML IJ SOLN
INTRAMUSCULAR | Status: DC | PRN
  Administered 2021-03-27: 5 mg via INTRAVENOUS

## 2021-03-27 MED ORDER — LATANOPROST 0.005 % OP SOLN
1.0000 [drp] | Freq: Every day | OPHTHALMIC | Status: DC
  Administered 2021-03-27 – 2021-03-31 (×4): 1 [drp] via OPHTHALMIC
  Filled 2021-03-27: qty 2.5

## 2021-03-27 MED ORDER — ROCURONIUM BROMIDE 10 MG/ML (PF) SYRINGE
PREFILLED_SYRINGE | INTRAVENOUS | Status: DC | PRN
Start: 2021-03-27 — End: 2021-03-27
  Administered 2021-03-27: 60 mg via INTRAVENOUS

## 2021-03-27 MED ORDER — INDOMETHACIN 50 MG RE SUPP
RECTAL | Status: DC | PRN
  Administered 2021-03-27: 100 mg via RECTAL

## 2021-03-27 MED ORDER — SODIUM CHLORIDE 0.9 % IV SOLN
INTRAVENOUS | Status: DC | PRN
  Administered 2021-03-27: 09:00:00 40 mL

## 2021-03-27 MED ORDER — ONDANSETRON HCL 4 MG/2ML IJ SOLN
INTRAMUSCULAR | Status: DC | PRN
  Administered 2021-03-27: 4 mg via INTRAVENOUS

## 2021-03-27 MED ORDER — PROPOFOL 10 MG/ML IV BOLUS
INTRAVENOUS | Status: AC
  Filled 2021-03-27: qty 20

## 2021-03-27 MED ORDER — INDOMETHACIN 50 MG RE SUPP
RECTAL | Status: AC
  Filled 2021-03-27: qty 2

## 2021-03-27 MED ORDER — GLUCAGON HCL RDNA (DIAGNOSTIC) 1 MG IJ SOLR
INTRAMUSCULAR | Status: AC
  Filled 2021-03-27: qty 1

## 2021-03-27 MED ORDER — LOPERAMIDE HCL 2 MG PO CAPS
4.0000 mg | ORAL_CAPSULE | Freq: Four times a day (QID) | ORAL | Status: DC | PRN
  Administered 2021-03-27 – 2021-04-01 (×11): 4 mg via ORAL
  Filled 2021-03-27 (×11): qty 2

## 2021-03-27 MED ORDER — SUGAMMADEX SODIUM 200 MG/2ML IV SOLN
INTRAVENOUS | Status: DC | PRN
  Administered 2021-03-27: 200 mg via INTRAVENOUS

## 2021-03-27 MED ORDER — POTASSIUM CHLORIDE CRYS ER 20 MEQ PO TBCR
40.0000 meq | EXTENDED_RELEASE_TABLET | ORAL | Status: AC
  Administered 2021-03-27 (×2): 40 meq via ORAL
  Filled 2021-03-27 (×2): qty 2

## 2021-03-27 MED ORDER — LACTATED RINGERS IV SOLN: INTRAVENOUS | Status: DC | PRN

## 2021-03-27 MED ORDER — LOPERAMIDE HCL 2 MG PO CAPS
2.0000 mg | ORAL_CAPSULE | Freq: Four times a day (QID) | ORAL | Status: DC | PRN
  Administered 2021-03-27 (×2): 2 mg via ORAL
  Filled 2021-03-27 (×2): qty 1

## 2021-03-27 NOTE — Interval H&P Note (Signed)
History and Physical Interval Note:  03/27/2021 7:18 AM  Rebecca [name]  has presented today for surgery, with the diagnosis of Elevated LFTs, choledocholithiasis.  The various methods of treatment have been discussed with the patient and family. After consideration of risks, benefits and other options for treatment, the patient has consented to  Procedure(s): ENDOSCOPIC RETROGRADE CHOLANGIOPANCREATOGRAPHY (ERCP) (N/A) as a surgical intervention.  The patient's history has been reviewed, patient examined, no change in status, stable for surgery.  I have reviewed the patient's chart and labs.  Questions were answered to the patient's satisfaction.     [name]

## 2021-03-27 NOTE — Anesthesia Postprocedure Evaluation (Signed)
Anesthesia Post Note  Patient: CHRYSTINA NAFF  Procedure(s) Performed: ENDOSCOPIC RETROGRADE CHOLANGIOPANCREATOGRAPHY (ERCP) BILIARY DILATION SPHINCTEROTOMY REMOVAL OF STONES     Patient location during evaluation: PACU Anesthesia Type: General Level of consciousness: awake and alert Pain management: pain level controlled Vital Signs Assessment: post-procedure vital signs reviewed and stable Respiratory status: spontaneous breathing, nonlabored ventilation and respiratory function stable Cardiovascular status: blood pressure returned to baseline and stable Postop Assessment: no apparent nausea or vomiting Anesthetic complications: no   No notable events documented.  Last Vitals:  Vitals:   03/27/21 0915 03/27/21 0930  BP: 129/69   Pulse: (!) 59   Resp: 20 (!) 22  Temp:    SpO2: 96%     Last Pain:  Vitals:   03/27/21 0915  TempSrc:   PainSc: 0-No pain                 Lidia Collum

## 2021-03-27 NOTE — Progress Notes (Signed)
Day of Surgery   Subjective/Chief Complaint: Patient post ERCP.  She is doing well without complaint.  Discussed surgery with her and her daughter.  She would like to proceed with laparoscopic cholecystectomy while she is in the hospital.   Objective: Vital signs in last 24 hours: Temp:  [98 F (36.7 C)-98.6 F (37 C)] 98 F (36.7 C) (12/24 0853) Pulse Rate:  [52-73] 59 (12/24 0915) Resp:  [14-25] 22 (12/24 0930) BP: (112-150)/(56-97) 129/69 (12/24 0915) SpO2:  [96 %-100 %] 96 % (12/24 0915) Weight:  [57.1 kg] 57.1 kg (12/24 0538) Last BM Date: 03/26/21  Intake/Output from previous day: 12/23 0701 - 12/24 0700 In: 1583.3 [I.V.:1033.3; IV Piggyback:550] Out: -  Intake/Output this shift: Total I/O In: 240 [P.O.:240] Out: -   General appearance: alert and cooperative Resp: clear to auscultation bilaterally Cardio: regular rate and rhythm GI: Soft nontender  Lab Results:  Recent Labs    03/26/21 0338 03/27/21 0546  WBC 23.0* 10.8*  HGB 9.8* 9.6*  HCT 28.3* 29.1*  PLT 184 154   BMET Recent Labs    03/26/21 0338 03/27/21 0546  NA 126* 135  K 4.0 3.3*  CL 99 106  CO2 22 22  GLUCOSE 128* 127*  BUN 11 10  CREATININE 0.87 0.86  CALCIUM 8.9 8.9   PT/INR Recent Labs    03/26/21 1111  LABPROT 16.5*  INR 1.3*   ABG No results for input(s): PHART, HCO3 in the last 72 hours.  Invalid input(s): PCO2, PO2  Studies/Results: CT Abdomen Pelvis Wo Contrast  Addendum Date: 03/25/2021   ADDENDUM REPORT: 03/25/2021 23:01 ADDENDUM: Bilateral lower lobe nodules are identified as described which are slightly increased when compared with 2017. Non-contrast chest CT at 3-6 months is recommended. If the nodules are stable at time of repeat CT, then future CT at 18-24 months (from today's scan) is considered optional for low-risk patients, but is recommended for high-risk patients. This recommendation follows the consensus statement: Guidelines for Management of Incidental  Pulmonary Nodules Detected on CT Images: From the Fleischner Society 2017; Radiology 2017; 284:228-243. Electronically Signed   By: Inez Catalina M.D.   On: 03/25/2021 23:01   Result Date: 03/25/2021 CLINICAL DATA:  Abdominal pain on the right, initial encounter EXAM: CT ABDOMEN AND PELVIS WITHOUT CONTRAST TECHNIQUE: Multidetector CT imaging of the abdomen and pelvis was performed following the standard protocol without IV contrast. COMPARISON:  02/23/2016 FINDINGS: Lower chest: Lung bases are free of acute infiltrate. Bilateral lower lobe nodules are noted measuring up to 9 mm on the left and 6 mm on the right. These both show slight increase in size when compared with the prior exam. The larger measured approximately 7 mm on the prior study. Hepatobiliary: Scattered hypodensities are noted throughout the liver consistent with simple cyst. These are stable from the prior exam. Gallbladder is well distended with dependent density consistent with small stones. No wall thickening or inflammatory changes are noted. Pancreas: Unremarkable. No pancreatic ductal dilatation or surrounding inflammatory changes. Spleen: Normal in size without focal abnormality. Adrenals/Urinary Tract: Adrenal glands are within normal limits. Kidneys demonstrate no renal calculi or obstructive changes. Bladder is well distended. Findings suspicious for any anterior bladder diverticulum are noted. Stomach/Bowel: Scattered diverticular change is seen without evidence of diverticulitis. No obstructive changes of the colon are seen. The appendix has been surgically removed stomach is unremarkable. Proximal small bowel is unremarkable. There are a few loops which are mildly prominent but measure less than 3 cm in  diameter. No definitive obstructive changes are seen as contrast passes distally into the proximal colon. Vascular/Lymphatic: Aortic atherosclerosis. No enlarged abdominal or pelvic lymph nodes. Reproductive: Uterus and bilateral adnexa  are unremarkable. Pessary is noted in place. Other: No abdominal wall hernia or abnormality. No abdominopelvic ascites. Musculoskeletal: Degenerative changes of lumbar spine are seen with scoliosis concave to the left. IMPRESSION: Diverticulosis without diverticulitis. Mildly prominent small bowel although measuring less than 3 cm in diameter and contrast material passes through this area without difficulty. No inflammatory changes are seen. Cholelithiasis without definitive complicating factors. Ultrasound may be helpful for further evaluation. No other focal abnormality is noted. Electronically Signed: By: Inez Catalina M.D. On: 03/25/2021 22:52   DG ERCP  Result Date: 03/27/2021 CLINICAL DATA:  Choledocholithiasis. EXAM: ERCP TECHNIQUE: Multiple spot images obtained with the fluoroscopic device and submitted for interpretation post-procedure. FLUOROSCOPY TIME:  1 minute, 47 seconds (27 mGy) COMPARISON:  MRCP-03/26/2021; CT abdomen pelvis-03/25/2021 FINDINGS: Several spot fluoroscopic images of the right upper abdominal quadrant during ERCP are provided for review. Initial image demonstrates an ERCP probe overlying the right upper abdominal quadrant. Note is made of a moderate to severe scoliotic curvature of the thoracolumbar spine. Subsequent images demonstrate selective cannulation and opacification of the CBD which appears moderately dilated. There are two persistent nonocclusive filling defects within the CBD compatible with choledocholithiasis, demonstrated on preceding cross-sectional imaging. Subsequent images demonstrate insufflation of a balloon within distal aspect of the CBD with presumed biliary sweeping, stone extraction and sphincterotomy. There is minimal opacification of the intrahepatic biliary tree which appears nondilated. There is no definitive opacification of either the cystic or pancreatic ducts. IMPRESSION: ERCP with findings of choledocholithiasis with subsequent biliary sweeping,  stone extraction and presumed sphincterotomy. These images were submitted for radiologic interpretation only. Please see the procedural report for the amount of contrast and the fluoroscopy time utilized. Electronically Signed   By: Sandi Mariscal M.D.   On: 03/27/2021 09:07   MR ABDOMEN MRCP W WO CONTAST  Result Date: 03/26/2021 CLINICAL DATA:  Jaundice. EXAM: MRI ABDOMEN WITHOUT AND WITH CONTRAST (INCLUDING MRCP) TECHNIQUE: Multiplanar multisequence MR imaging of the abdomen was performed both before and after the administration of intravenous contrast. Heavily T2-weighted images of the biliary and pancreatic ducts were obtained, and three-dimensional MRCP images were rendered by post processing. CONTRAST:  41mL GADAVIST GADOBUTROL 1 MMOL/ML IV SOLN COMPARISON:  03/25/2021. FINDINGS: Lower chest: No acute findings. Hepatobiliary: Multiple T2 hyperintensities are present in the liver, compatible with cysts. The common bile duct is distended measuring 1.2 cm in diameter. Multiple stones are identified in the mid to distal common bile duct, the largest measuring 1 cm in diameter. Stones are present within the gallbladder. Pancreas: No mass, inflammatory changes, or other parenchymal abnormality identified. Spleen:  Within normal limits in size and appearance. Adrenals/Urinary Tract: The adrenal glands are within normal limits. Bilateral renal cysts are noted. There is no hydronephrosis bilaterally. Stomach/Bowel: Visualized portions within the abdomen are unremarkable. Vascular/Lymphatic: A few prominent lymph nodes are noted anterior to the inferior vena cava in the right upper quadrant which may be reactive. The aorta is normal in caliber. Other:  No free fluid in the abdomen. Musculoskeletal: Dextroscoliosis of the lumbar spine. IMPRESSION: 1. Distended common bile duct with choledocholithiasis. ERCP is recommended for follow-up. 2. Cholelithiasis. 3. Hepatic cysts. 4. Bilateral renal cysts. Electronically Signed    By: Brett Fairy M.D.   On: 03/26/2021 01:13   US Abdomen Limited RUQ (LIVER/GB)  Result Date: 03/25/2021 CLINICAL DATA:  Right upper quadrant pain. EXAM: ULTRASOUND ABDOMEN LIMITED RIGHT UPPER QUADRANT COMPARISON:  CT abdomen 02/23/2016 FINDINGS: Gallbladder: Cholelithiasis without gallbladder wall thickening or pericholecystic fluid. Negative sonographic Murphy sign. Largest gallstone measures 7 mm. Common bile duct: Diameter: 3 mm Liver: Normal hepatic parenchymal echogenicity. Multiple anechoic hepatic masses consistent with cysts unchanged compared with the prior examination of 02/23/2016. There is a 1.6 x 1.5 x 1.4 cm right hepatic cyst. There is a 2.6 x 2.3 x 2.3 cm right hepatic cyst. There is a 9 x 9 x 11 mm left hepatic cyst. Mild prominence of the intrahepatic biliary ducts. Portal vein is patent on color Doppler imaging with normal direction of blood flow towards the liver. Other: None. IMPRESSION: 1. Cholelithiasis without sonographic evidence of acute cholecystitis. 2. Multiple hepatic cysts. Electronically Signed   By: Kathreen Devoid M.D.   On: 03/25/2021 18:45    Anti-infectives: Anti-infectives (From admission, onward)    Start     Dose/Rate Route Frequency Ordered Stop   03/26/21 2200  piperacillin-tazobactam (ZOSYN) IVPB 3.375 g        3.375 g 12.5 mL/hr over 240 Minutes Intravenous Every 8 hours 03/26/21 1526     03/26/21 0230  piperacillin-tazobactam (ZOSYN) IVPB 3.375 g  Status:  Discontinued        3.375 g 12.5 mL/hr over 240 Minutes Intravenous Every 8 hours 03/26/21 0137 03/26/21 1526       Assessment/Plan: Choledocholithiasis status post successful ERCP  Cholelithiasis-recommend laparoscopic cholecystectomy during this admission.  We will tentatively plan for tomorrow with Dr. Marcello Moores. The procedure has been discussed with the patient. Operative and non operative treatments have been discussed. Risks of surgery include bleeding, infection,  Common bile duct injury,   Injury to the stomach,liver, colon,small intestine, abdominal wall,  Diaphragm,  Major blood vessels,  And the need for an open procedure.  Other risks include worsening of medical problems, death,  DVT and pulmonary embolism, and cardiovascular events.   Medical options have also been discussed. The patient has been informed of long term expectations of surgery and non surgical options,  The patient agrees to proceed.      LOS: 1 day    Joyice Faster Jennifer Holland 03/27/2021

## 2021-03-27 NOTE — Progress Notes (Addendum)
PROGRESS NOTE    SHARNESE [name]  OQH:[id number] DOB: 02-25-1933 DOA: 03/25/2021 PCP: [name], MD    Brief Narrative:  Rebecca [name] was admitted to the hospital with the working diagnosis of acute cholelithiasis with choledocholithiasis.    87 yo female with the past medical history of HTN, chronic hyponatremia, GERD, and hypothyroidism who presented with abdominal pain, nausea and vomiting. She was diagnosed with biliary colic due gallbladder sludge in November 2022, and referred to surgical evaluation. Her appointment was scheduled for 03/30/21. Because of persistent and worsening symptoms she presented to the hospital for more urgent evaluation. On her initial physical examination her blood pressure was 157/97, HR 84, RR 18 and oxygen saturation 95% on room air. Lungs were clear to auscultation, heart with S1 and S2 present and rhythmic, abdomen soft and non tender to palpation, no lower extremity edema.   Sodium 128, potassium 4.1, chloride 98, bicarb 23, glucose 176, BUN 12, creatinine 0.9 alkaline phosphatase 174,, AST 356, ALT 294, total bilirubin presents to clinic 69 white count 27.4, hemoglobin 12.2, hematocrit 36.8, platelets 261. Urinalysis specific gravity 1.015.   CT of the abdomen is reported reticulosis with no diverticulitis. Mild prominent small bowel. Positive cholelithiasis with no definite complicating factors.  Ultrasonography cholelithiasis without sonographic evidence of acute cholecystitis.   MRCP with distended common bile duct with choledocholithiasis.  Cholelithiasis.  Hepatic cyst, bilateral renal cysts.   12/24 ERCP choledocholithiasis treated with biliary sphincterectomy, balloon dilatation of the sphincterotomy, balloon dilatation of the sphincterotomy site and the balloon sweeping of the bile duct. Cystic duct did not opacify.   Plan for cholecystectomy in am.    Assessment & Plan:   Principal Problem:   Cholelithiasis Active Problems:    Hypothyroidism   Essential hypertension   GERD   Elevated LFTs   Chronic hyponatremia   Choledocholithiasis   Cholangitis   Malnutrition of moderate degree     Cholelithiasis complicated with choledocholithiasis. Cholangitis with severe sepsis  Patient is sp ERCP with sphincterectomy and balloon sweeping. Today she is having no nausea or vomiting and tolerating po diet well.  No mention of pus on the ERCP examination and wbc is down to 10.8, she has been afebrile.  AST, ALT and total Bil trending down.  Plan to continue with antibiotic therapy for now and follow up with surgery findings.  Continue pain control with morphine and as needed antiemetics Continue with antiacid therapy.    2. Hyponatremia/ hypokalemia. Moderate calorie protein malnutrition Renal function continue to be sable with serum cr at 0,86, K is 3,3 and serum bicarbonate at at 22.  Plan to add KCl 40 meq x2 and follow up renal function and electrolytes in am. Hold on IV fluids.  Continue with nutritional supplements.   3. Hypothyroid.  On levothyroxine   4. HTN.  Her blood pressure has improved with systolic 546 to 568 mmHg, Continue to hold on antihypertensive medications Hold on IV fluids for now.    5. GERD.  Continue with pantoprazole    6. Anxiety. Continue as needed alprazolam with good toleration   Status is: Inpatient  Remains inpatient appropriate because: pending cholecystectomy    DVT prophylaxis: enoxaparin   Code Status:    DNR  Family Communication:  I spoke with patient's daughter at the bedside, we talked in detail about patient's condition, plan of care and prognosis and all questions were addressed.      Nutrition Status: Nutrition Problem: Moderate Malnutrition Etiology: acute illness (  biliary colic d/t gallbladder sludge) Signs/Symptoms: moderate fat depletion, moderate muscle depletion Interventions: Boost Plus, MVI    Consultants:  GI  Surgery   Procedures:   ERCP  Antimicrobials:  Zosyn     Subjective: Patient with no nausea or vomiting, no chest pain or dyspnea, her abdominal pain continue to improve,.   Objective: Vitals:   03/27/21 0853 03/27/21 0900 03/27/21 0915 03/27/21 0930  BP: (!) 129/97 132/64 129/69   Pulse: 68 64 (!) 59   Resp: 20 (!) 25 20 (!) 22  Temp: 98 F (36.7 C)     TempSrc:      SpO2: 100% 100% 96%   Weight:      Height:        Intake/Output Summary (Last 24 hours) at 03/27/2021 1354 Last data filed at 03/27/2021 0930 Gross per 24 hour  Intake 1823.31 ml  Output --  Net 1823.31 ml   Filed Weights   03/25/21 1543 03/26/21 0105 03/27/21 0538  Weight: 52.2 kg 52 kg 57.1 kg    Examination:   General: Not in pain or dyspnea,. Deconditioned  Neurology: Awake and alert, non focal  E ENT: no pallor, no icterus, oral mucosa moist Cardiovascular: No JVD. S1-S2 present, rhythmic, no gallops, rubs, or murmurs. No lower extremity edema. Pulmonary: positive breath sounds bilaterally, with no wheezing, rhonchi or rales. Gastrointestinal. Abdomen soft and non tender Skin. No rashes Musculoskeletal: no joint deformities     Data Reviewed: I have personally reviewed following labs and imaging studies  CBC: Recent Labs  Lab 03/25/21 1557 03/26/21 0338 03/27/21 0546  WBC 27.4* 23.0* 10.8*  NEUTROABS 8.3* 7.7  --   HGB 12.2 9.8* 9.6*  HCT 36.8 28.3* 29.1*  MCV 100.8* 99.3 102.8*  PLT 261 184 423   Basic Metabolic Panel: Recent Labs  Lab 03/25/21 1557 03/26/21 0338 03/27/21 0546  NA 128* 126* 135  K 4.1 4.0 3.3*  CL 98 99 106  CO2 23 22 22   GLUCOSE 176* 128* 127*  BUN 12 11 10   CREATININE 0.98 0.87 0.86  CALCIUM 9.7 8.9 8.9   GFR: Estimated Creatinine Clearance: 38 mL/min (by C-G formula based on SCr of 0.86 mg/dL). Liver Function Tests: Recent Labs  Lab 03/25/21 1557 03/26/21 0338 03/27/21 0546  AST 356* 169* 69*  ALT 294* 216* 134*  ALKPHOS 174* 124 117  BILITOT 2.6* 2.9* 1.0   PROT 6.8 5.1* 5.2*  ALBUMIN 3.9 2.9* 3.0*   Recent Labs  Lab 03/25/21 1557  LIPASE 26   No results for input(s): AMMONIA in the last 168 hours. Coagulation Profile: Recent Labs  Lab 03/26/21 1111  INR 1.3*   Cardiac Enzymes: No results for input(s): CKTOTAL, CKMB, CKMBINDEX, TROPONINI in the last 168 hours. BNP (last 3 results) No results for input(s): PROBNP in the last 8760 hours. HbA1C: No results for input(s): HGBA1C in the last 72 hours. CBG: No results for input(s): GLUCAP in the last 168 hours. Lipid Profile: No results for input(s): CHOL, HDL, LDLCALC, TRIG, CHOLHDL, LDLDIRECT in the last 72 hours. Thyroid Function Tests: Recent Labs    03/26/21 0338  TSH 0.766  FREET4 1.43*   Anemia Panel: No results for input(s): VITAMINB12, FOLATE, FERRITIN, TIBC, IRON, RETICCTPCT in the last 72 hours.    Radiology Studies: I have reviewed all of the imaging during this hospital visit personally     Scheduled Meds:  lactose free nutrition  237 mL Oral TID WC   levothyroxine  88 mcg Oral  [id number]   multivitamin with minerals  1 tablet Oral Daily   mupirocin ointment  1 application Nasal BID   pantoprazole (PROTONIX) IV  40 mg Intravenous Daily   Continuous Infusions:  dextrose 5 % and 0.9% NaCl 75 mL/hr at 03/27/21 0411   piperacillin-tazobactam (ZOSYN)  IV 3.375 g (03/27/21 1333)     LOS: 1 day        Serene Kopf Gerome Apley, MD

## 2021-03-27 NOTE — Op Note (Signed)
[facility] Patient Name: Rebecca [name] Procedure Date: 03/27/2021 MRN: [id number] Attending MD: [name] , MD Date of Birth: 1933/03/11 CSN: [id number] Age: 87 Admit Type: Inpatient Procedure:                ERCP Indications:              intermittent abd pain, elevated liver tests,                            gallstones in GB and also choledocholithiasis on                            imaging Providers:                Doristine Johns, RN, Cherylynn Ridges, Technician,                            Margurite Auerbach, [name], MD Referring MD:              Medicines:                General Anesthesia, Indomethacin 100 mg PR, IV                            zosyn in hospital Complications:            No immediate complications. Estimated blood loss:                            None Estimated Blood Loss:     Estimated blood loss: none. Procedure:                Pre-Anesthesia Assessment:                           - Prior to the procedure, a History and Physical                            was performed, and patient medications and                            allergies were reviewed. The patient's tolerance of                            previous anesthesia was also reviewed. The risks                            and benefits of the procedure and the sedation                            options and risks were discussed with the patient.                            All questions were answered, and informed consent                            was obtained. Prior Anticoagulants: The  patient has                            taken no previous anticoagulant or antiplatelet                            agents. ASA Grade Assessment: III - A patient with                            severe systemic disease. After reviewing the risks                            and benefits, the patient was deemed in                            satisfactory condition to undergo the procedure.                            After obtaining informed consent, the scope was                            passed under direct vision. Throughout the                            procedure, the patient's blood pressure, pulse, and                            oxygen saturations were monitored continuously. The                            [id number]-[id number] ([id number]) Olympus duodenoscope was                            introduced through the mouth, and used to inject                            contrast into and used to cannulate the bile duct.                            The ERCP was accomplished without difficulty. The                            patient tolerated the procedure well. Scope In: Scope Out: Findings:      Scout film showed spinal scoliosis but was otherwise essentially normal.       I advanced the duodenoscope to the region of the major papilla without       detailed examination of the upper GI tract. There was a medium sized       periampullary duodenal diverticulum, pantaloon. This did not interfere       with biliary cannulation. I used a 44 autotome over 0.035 Hydra wire to       cannulate the bile duct and then injected contrast. Cholangiogram       revealed a diffusely dilated extrahepatic biliary tree with the common  bile duct maximum diameter around 12 mm. The cystic duct did not       opacify. There were several small to medium sized mobile filling defects       within the bile duct. I performed a biliary sphincterotomy over the wire       however this was limited due to the diverticulum and so I dilated the       sphincterotomy site up to 12 mm with a CRE balloon to facilitate stone       removal. I then used a 9 mm to 12 mm biliary retrieval balloon to sweep       the duct several times. This delivered 3 discrete brown stones (7-59mm)       as well as granular stone debris into the duodenum. There was no       purulence. I performed a completion, occlusion cholangiogram which       showed no  remaining filling defects. The main pancreatic duct was       cannulated a single time with a wire but was never injected with dye. Impression:               - Choledocholithiasis; treated with biliary                            sphincterotomy, balloon dilation of the                            sphincterotomy site and then balloon sweeping of                            the bile duct.                           - Medium sized pantaloon periampullary diverticulum                            which did not interfere with biliary cannulation.                           - Cystic duct did not opacify. Moderate Sedation:      Not Applicable - Patient had care per Anesthesia. Recommendation:           - Follow clinically for post ERCP complications.                           - Repeat LFTs in the morning.                           - Cholecystectomy timing per general surgery                            service, it is unlikely that it will be done today                            and so I will order solid food for now. Procedure Code(s):        --- Professional ---  43262, Endoscopic retrograde                            cholangiopancreatography (ERCP); with                            sphincterotomy/papillotomy Diagnosis Code(s):        --- Professional ---                           K80.50, Calculus of bile duct without cholangitis                            or cholecystitis without obstruction CPT copyright 2019 American Medical Association. All rights reserved. The codes documented in this report are preliminary and upon coder review may  be revised to meet current compliance requirements. [name], MD 03/27/2021 8:54:55 AM This report has been signed electronically. Number of Addenda: 0

## 2021-03-27 NOTE — Transfer of Care (Signed)
Immediate Anesthesia Transfer of Care Note  Patient: Rebecca [name]  Procedure(s) Performed: ENDOSCOPIC RETROGRADE CHOLANGIOPANCREATOGRAPHY (ERCP) BILIARY DILATION SPHINCTEROTOMY REMOVAL OF STONES  Patient Location: PACU  Anesthesia Type:General  Level of Consciousness: awake, alert  and oriented  Airway & Oxygen Therapy: Patient Spontanous Breathing and Patient connected to face mask  Post-op Assessment: Report given to RN and Post -op Vital signs reviewed and stable  Post vital signs: Reviewed and stable  Last Vitals:  Vitals Value Taken Time  BP 132/64 03/27/21 0900  Temp 36.7 C 03/27/21 0853  Pulse 66 03/27/21 0902  Resp 21 03/27/21 0902  SpO2 98 % 03/27/21 0902  Vitals shown include unvalidated device data.  Last Pain:  Vitals:   03/27/21 0853  TempSrc:   PainSc: 0-No pain      Patients Stated Pain Goal: 0 (31/43/88 8757)  Complications: No notable events documented.

## 2021-03-27 NOTE — Anesthesia Procedure Notes (Signed)
Procedure Name: Intubation Date/Time: 03/27/2021 7:46 AM Performed by: Rosaland Lao, CRNA Pre-anesthesia Checklist: Patient identified, Emergency Drugs available, Suction available and Patient being monitored Patient Re-evaluated:Patient Re-evaluated prior to induction Oxygen Delivery Method: Circle system utilized Preoxygenation: Pre-oxygenation with 100% oxygen Induction Type: IV induction Ventilation: Mask ventilation without difficulty Laryngoscope Size: Miller and 2 Grade View: Grade I Tube type: Oral Tube size: 7.0 mm Number of attempts: 1 Airway Equipment and Method: Stylet Placement Confirmation: ETT inserted through vocal cords under direct vision, positive ETCO2 and breath sounds checked- equal and bilateral Secured at: 22 cm Tube secured with: Tape Dental Injury: Teeth and Oropharynx as per pre-operative assessment

## 2021-03-28 ENCOUNTER — Inpatient Hospital Stay (HOSPITAL_COMMUNITY): Payer: Medicare Other | Admitting: Certified Registered Nurse Anesthetist

## 2021-03-28 ENCOUNTER — Encounter (HOSPITAL_COMMUNITY)
Admission: EM | Disposition: A | Discharge status: Home / Self Care | Payer: Self-pay | Source: Home / Self Care | Attending: Internal Medicine

## 2021-03-28 DIAGNOSIS — R197 Diarrhea, unspecified: Secondary | ICD-10-CM

## 2021-03-28 DIAGNOSIS — R7989 Other specified abnormal findings of blood chemistry: Secondary | ICD-10-CM | POA: Diagnosis not present

## 2021-03-28 DIAGNOSIS — K8050 Calculus of bile duct without cholangitis or cholecystitis without obstruction: Secondary | ICD-10-CM | POA: Diagnosis not present

## 2021-03-28 HISTORY — PX: CHOLECYSTECTOMY: SHX55

## 2021-03-28 LAB — CBC
HCT: 31.3 % — ABNORMAL LOW (ref 36.0–46.0)
Hemoglobin: 10.1 g/dL — ABNORMAL LOW (ref 12.0–15.0)
MCH: 33.9 pg (ref 26.0–34.0)
MCHC: 32.3 g/dL (ref 30.0–36.0)
MCV: 105 fL — ABNORMAL HIGH (ref 80.0–100.0)
Platelets: 174 10*3/uL (ref 150–400)
RBC: 2.98 MIL/uL — ABNORMAL LOW (ref 3.87–5.11)
RDW: 13 % (ref 11.5–15.5)
WBC: 20.3 10*3/uL — ABNORMAL HIGH (ref 4.0–10.5)
nRBC: 0 % (ref 0.0–0.2)

## 2021-03-28 LAB — COMPREHENSIVE METABOLIC PANEL
ALT: 95 U/L — ABNORMAL HIGH (ref 0–44)
AST: 42 U/L — ABNORMAL HIGH (ref 15–41)
Albumin: 3.1 g/dL — ABNORMAL LOW (ref 3.5–5.0)
Alkaline Phosphatase: 102 U/L (ref 38–126)
Anion gap: 6 (ref 5–15)
BUN: 8 mg/dL (ref 8–23)
CO2: 21 mmol/L — ABNORMAL LOW (ref 22–32)
Calcium: 9 mg/dL (ref 8.9–10.3)
Chloride: 107 mmol/L (ref 98–111)
Creatinine, Ser: 0.9 mg/dL (ref 0.44–1.00)
GFR, Estimated: >60 mL/min (ref >60)
Glucose, Bld: 128 mg/dL — ABNORMAL HIGH (ref 70–99)
Potassium: 4 mmol/L (ref 3.5–5.1)
Sodium: 134 mmol/L — ABNORMAL LOW (ref 135–145)
Total Bilirubin: 1 mg/dL (ref 0.3–1.2)
Total Protein: 5.4 g/dL — ABNORMAL LOW (ref 6.5–8.1)

## 2021-03-28 LAB — MAGNESIUM: Magnesium: 1.8 mg/dL (ref 1.7–2.4)

## 2021-03-28 SURGERY — LAPAROSCOPIC CHOLECYSTECTOMY WITH INTRAOPERATIVE CHOLANGIOGRAM
Anesthesia: General | Site: Abdomen

## 2021-03-28 MED ORDER — BUPIVACAINE-EPINEPHRINE (PF) 0.25% -1:200000 IJ SOLN
INTRAMUSCULAR | Status: AC
  Filled 2021-03-28: qty 30

## 2021-03-28 MED ORDER — DEXAMETHASONE SODIUM PHOSPHATE 10 MG/ML IJ SOLN
INTRAMUSCULAR | Status: AC
  Filled 2021-03-28: qty 1

## 2021-03-28 MED ORDER — EPHEDRINE SULFATE-NACL 50-0.9 MG/10ML-% IV SOSY
PREFILLED_SYRINGE | INTRAVENOUS | Status: DC | PRN
  Administered 2021-03-28: 5 mg via INTRAVENOUS

## 2021-03-28 MED ORDER — GLYCOPYRROLATE 0.2 MG/ML IJ SOLN
INTRAMUSCULAR | Status: AC
  Filled 2021-03-28: qty 1

## 2021-03-28 MED ORDER — PROPOFOL 10 MG/ML IV BOLUS
INTRAVENOUS | Status: DC | PRN
  Administered 2021-03-28: 100 mg via INTRAVENOUS
  Administered 2021-03-28 (×2): 20 mg via INTRAVENOUS

## 2021-03-28 MED ORDER — LIDOCAINE HCL (PF) 2 % IJ SOLN
INTRAMUSCULAR | Status: AC
  Filled 2021-03-28: qty 5

## 2021-03-28 MED ORDER — AMISULPRIDE (ANTIEMETIC) 5 MG/2ML IV SOLN: 10.0000 mg | Freq: Once | INTRAVENOUS | Status: DC | PRN

## 2021-03-28 MED ORDER — SUGAMMADEX SODIUM 200 MG/2ML IV SOLN
INTRAVENOUS | Status: DC | PRN
  Administered 2021-03-28: 200 mg via INTRAVENOUS

## 2021-03-28 MED ORDER — FENTANYL CITRATE (PF) 100 MCG/2ML IJ SOLN
INTRAMUSCULAR | Status: AC
  Filled 2021-03-28: qty 2

## 2021-03-28 MED ORDER — BUPIVACAINE-EPINEPHRINE 0.25% -1:200000 IJ SOLN
INTRAMUSCULAR | Status: DC | PRN
  Administered 2021-03-28: 30 mL

## 2021-03-28 MED ORDER — FENTANYL CITRATE (PF) 100 MCG/2ML IJ SOLN
INTRAMUSCULAR | Status: DC | PRN
  Administered 2021-03-28 (×4): 50 ug via INTRAVENOUS

## 2021-03-28 MED ORDER — PHENYLEPHRINE HCL-NACL 20-0.9 MG/250ML-% IV SOLN
INTRAVENOUS | Status: AC
  Filled 2021-03-28: qty 250

## 2021-03-28 MED ORDER — ROCURONIUM BROMIDE 10 MG/ML (PF) SYRINGE
PREFILLED_SYRINGE | INTRAVENOUS | Status: AC
  Filled 2021-03-28: qty 10

## 2021-03-28 MED ORDER — LACTATED RINGERS IR SOLN
Status: DC | PRN
  Administered 2021-03-28: 1000 mL

## 2021-03-28 MED ORDER — ARTIFICIAL TEARS OPHTHALMIC OINT
TOPICAL_OINTMENT | OPHTHALMIC | Status: AC
  Filled 2021-03-28: qty 3.5

## 2021-03-28 MED ORDER — MIDAZOLAM HCL 5 MG/5ML IJ SOLN
INTRAMUSCULAR | Status: DC | PRN
  Administered 2021-03-28: 1 mg via INTRAVENOUS

## 2021-03-28 MED ORDER — PROMETHAZINE HCL 25 MG/ML IJ SOLN
6.2500 mg | INTRAMUSCULAR | Status: DC | PRN
Start: 2021-03-28 — End: 2021-03-28

## 2021-03-28 MED ORDER — ONDANSETRON HCL 4 MG/2ML IJ SOLN
INTRAMUSCULAR | Status: AC
  Filled 2021-03-28: qty 2

## 2021-03-28 MED ORDER — ROCURONIUM BROMIDE 10 MG/ML (PF) SYRINGE
PREFILLED_SYRINGE | INTRAVENOUS | Status: DC | PRN
  Administered 2021-03-28: 60 mg via INTRAVENOUS

## 2021-03-28 MED ORDER — LACTATED RINGERS IV SOLN: INTRAVENOUS | Status: DC | PRN

## 2021-03-28 MED ORDER — PIPERACILLIN-TAZOBACTAM 3.375 G IVPB
INTRAVENOUS | Status: AC
  Filled 2021-03-28: qty 50

## 2021-03-28 MED ORDER — PROPOFOL 10 MG/ML IV BOLUS
INTRAVENOUS | Status: AC
  Filled 2021-03-28: qty 20

## 2021-03-28 MED ORDER — FENTANYL CITRATE PF 50 MCG/ML IJ SOSY
25.0000 ug | PREFILLED_SYRINGE | INTRAMUSCULAR | Status: DC | PRN
  Administered 2021-03-28: 09:00:00 50 ug via INTRAVENOUS

## 2021-03-28 MED ORDER — ONDANSETRON HCL 4 MG/2ML IJ SOLN
INTRAMUSCULAR | Status: DC | PRN
  Administered 2021-03-28: 4 mg via INTRAVENOUS

## 2021-03-28 MED ORDER — MIDAZOLAM HCL 2 MG/2ML IJ SOLN
INTRAMUSCULAR | Status: AC
  Filled 2021-03-28: qty 2

## 2021-03-28 MED ORDER — DEXAMETHASONE SODIUM PHOSPHATE 10 MG/ML IJ SOLN
INTRAMUSCULAR | Status: DC | PRN
  Administered 2021-03-28: 5 mg via INTRAVENOUS

## 2021-03-28 MED ORDER — FENTANYL CITRATE PF 50 MCG/ML IJ SOSY
PREFILLED_SYRINGE | INTRAMUSCULAR | Status: AC
  Filled 2021-03-28: qty 1

## 2021-03-28 MED ORDER — LIDOCAINE 2% (20 MG/ML) 5 ML SYRINGE
INTRAMUSCULAR | Status: DC | PRN
  Administered 2021-03-28: 60 mg via INTRAVENOUS

## 2021-03-28 SURGICAL SUPPLY — 44 items
ADH SKN CLS APL DERMABOND .7 (GAUZE/BANDAGES/DRESSINGS) ×1
APL PRP STRL LF DISP 70% ISPRP (MISCELLANEOUS) ×1
APPLIER CLIP 5 13 M/L LIGAMAX5 (MISCELLANEOUS) ×3
APR CLP MED LRG 5 ANG JAW (MISCELLANEOUS) ×1
BAG COUNTER SPONGE SURGICOUNT (BAG) IMPLANT
BAG SPEC RTRVL LRG 6X4 10 (ENDOMECHANICALS) ×1
BAG SPNG CNTER NS LX DISP (BAG)
BAG SURGICOUNT SPONGE COUNTING (BAG)
CABLE HIGH FREQUENCY MONO STRZ (ELECTRODE) ×4 IMPLANT
CHLORAPREP W/TINT 26 (MISCELLANEOUS) ×4 IMPLANT
CLIP APPLIE 5 13 M/L LIGAMAX5 (MISCELLANEOUS) ×2 IMPLANT
COVER MAYO STAND STRL (DRAPES) IMPLANT
COVER SURGICAL LIGHT HANDLE (MISCELLANEOUS) ×8 IMPLANT
DERMABOND ADVANCED (GAUZE/BANDAGES/DRESSINGS) ×2
DERMABOND ADVANCED .7 DNX12 (GAUZE/BANDAGES/DRESSINGS) ×2 IMPLANT
DEVICE TROCAR PUNCTURE CLOSURE (ENDOMECHANICALS) IMPLANT
DRAPE C-ARM 42X120 X-RAY (DRAPES) IMPLANT
DRAPE LAPAROSCOPIC ABDOMINAL (DRAPES) ×4 IMPLANT
ELECT REM PT RETURN 15FT ADLT (MISCELLANEOUS) ×4 IMPLANT
GLOVE SURG ENC MOIS LTX SZ6.5 (GLOVE) ×4 IMPLANT
GLOVE SURG UNDER LTX SZ6.5 (GLOVE) ×4 IMPLANT
GLOVE SURG UNDER POLY LF SZ7 (GLOVE) ×4 IMPLANT
GOWN STRL REUS W/TWL XL LVL3 (GOWN DISPOSABLE) ×12 IMPLANT
IRRIG SUCT STRYKERFLOW 2 WTIP (MISCELLANEOUS) ×3
IRRIGATION SUCT STRKRFLW 2 WTP (MISCELLANEOUS) ×2 IMPLANT
IV CATH 14GX2 1/4 (CATHETERS) IMPLANT
KIT BASIN OR (CUSTOM PROCEDURE TRAY) ×4 IMPLANT
KIT TURNOVER KIT A (KITS) IMPLANT
PENCIL SMOKE EVACUATOR (MISCELLANEOUS) IMPLANT
POUCH SPECIMEN RETRIEVAL 10MM (ENDOMECHANICALS) ×4 IMPLANT
SCISSORS LAP 5X35 DISP (ENDOMECHANICALS) ×4 IMPLANT
SET CHOLANGIOGRAPH MIX (MISCELLANEOUS) IMPLANT
SET TUBE SMOKE EVAC HIGH FLOW (TUBING) ×4 IMPLANT
SLEEVE XCEL OPT CAN 5 100 (ENDOMECHANICALS) ×8 IMPLANT
SUT VIC AB 2-0 SH 27 (SUTURE)
SUT VIC AB 2-0 SH 27X BRD (SUTURE) IMPLANT
SUT VIC AB 4-0 PS2 18 (SUTURE) ×4 IMPLANT
SUT VICRYL 0 UR6 27IN ABS (SUTURE) ×2 IMPLANT
TOWEL OR 17X26 10 PK STRL BLUE (TOWEL DISPOSABLE) ×4 IMPLANT
TOWEL OR NON WOVEN STRL DISP B (DISPOSABLE) ×4 IMPLANT
TRAY LAPAROSCOPIC (CUSTOM PROCEDURE TRAY) ×4 IMPLANT
TROCAR BLADELESS OPT 5 100 (ENDOMECHANICALS) ×4 IMPLANT
TROCAR XCEL BLUNT TIP 100MML (ENDOMECHANICALS) IMPLANT
TROCAR XCEL NON-BLD 11X100MML (ENDOMECHANICALS) IMPLANT

## 2021-03-28 NOTE — Anesthesia Procedure Notes (Signed)
Procedure Name: Intubation Date/Time: 03/28/2021 7:45 AM Performed by: Rosaland Lao, CRNA Pre-anesthesia Checklist: Patient identified, Emergency Drugs available, Suction available and Patient being monitored Patient Re-evaluated:Patient Re-evaluated prior to induction Oxygen Delivery Method: Circle system utilized Preoxygenation: Pre-oxygenation with 100% oxygen Induction Type: IV induction Ventilation: Mask ventilation without difficulty Laryngoscope Size: Miller and 2 Grade View: Grade I Tube type: Oral Tube size: 6.5 mm Number of attempts: 1 Airway Equipment and Method: Stylet and Oral airway Placement Confirmation: ETT inserted through vocal cords under direct vision, positive ETCO2 and breath sounds checked- equal and bilateral Secured at: 21 cm Tube secured with: Tape Dental Injury: Teeth and Oropharynx as per pre-operative assessment

## 2021-03-28 NOTE — Progress Notes (Signed)
Gastroenterology Inpatient Follow-up Note   PATIENT IDENTIFICATION  Rebecca [name] is a 87 y.o. female who presented with abdominal pain, leukocytosis, abnormal LFTs with imaging consistent with choledocholithiasis now status post ERCP. Hospital Day: 4  SUBJECTIVE  Patient was evaluated this afternoon and is already returned from her cholecystectomy. Patient does have some abdominal discomfort but is overall feeling well. She is passing gas. She has an appetite though it is not significant. She denies fevers/chills.   OBJECTIVE  Scheduled Inpatient Medications:   fentaNYL       lactose free nutrition  237 mL Oral TID WC   latanoprost  1 drop Both Eyes QHS   levothyroxine  88 mcg Oral Q0600   multivitamin with minerals  1 tablet Oral Daily   mupirocin ointment  1 application Nasal BID   pantoprazole  40 mg Oral Daily   phenylephrine       timolol  1 drop Both Eyes BID   Continuous Inpatient Infusions:   piperacillin-tazobactam (ZOSYN)  IV 0 g (03/28/21 0133)   PRN Inpatient Medications: acetaminophen **OR** acetaminophen, ALPRAZolam, loperamide, morphine injection, ondansetron **OR** ondansetron (ZOFRAN) IV   Physical Examination  Temp:  [97.6 F (36.4 C)-99.1 F (37.3 C)] 98.4 F (36.9 C) (12/25 0845) Pulse Rate:  [49-75] 60 (12/25 0930) Resp:  [16-21] 17 (12/25 0930) BP: (125-172)/(54-86) 172/63 (12/25 0930) SpO2:  [92 %-100 %] 94 % (12/25 0930) Weight:  [57.2 kg] 57.2 kg (12/25 0614) Temp (24hrs), Avg:98.6 F (37 C), Min:97.6 F (36.4 C), Max:99.1 F (37.3 C)  Weight: 57.2 kg GEN: NAD, appears stated age, doesn't appear chronically ill, son at bedside PSYCH: Cooperative, without pressured speech EYE: Anicteric ENT: Dry mucous membranes CV: Nontachycardic RESP: No audible wheezing GI: NABS, soft, tenderness to palpation in midepigastrium with areas of laparoscopic scars bandaged MSK/EXT: No lower extremity edema SKIN: No jaundice NEURO:  Alert & Oriented  x 3, no focal deficits, no evidence of asterixis   Review of Data   Laboratory Studies   Recent Labs  Lab 03/27/21 0546  NA 135  K 3.3*  CL 106  CO2 22  BUN 10  CREATININE 0.86  GLUCOSE 127*  CALCIUM 8.9   Recent Labs  Lab 03/27/21 0546  AST 69*  ALT 134*  ALKPHOS 117    Recent Labs  Lab 03/26/21 0338 03/27/21 0546 03/28/21 0909  WBC 23.0* 10.8* 20.3*  HGB 9.8* 9.6* 10.1*  HCT 28.3* 29.1* 31.3*  PLT 184 154 174   Recent Labs  Lab 03/26/21 1111  INR 1.3*    Imaging Studies  DG ERCP  Result Date: 03/27/2021 CLINICAL DATA:  Choledocholithiasis. EXAM: ERCP TECHNIQUE: Multiple spot images obtained with the fluoroscopic device and submitted for interpretation post-procedure. FLUOROSCOPY TIME:  1 minute, 47 seconds (27 mGy) COMPARISON:  MRCP-03/26/2021; CT abdomen pelvis-03/25/2021 FINDINGS: Several spot fluoroscopic images of the right upper abdominal quadrant during ERCP are provided for review. Initial image demonstrates an ERCP probe overlying the right upper abdominal quadrant. Note is made of a moderate to severe scoliotic curvature of the thoracolumbar spine. Subsequent images demonstrate selective cannulation and opacification of the CBD which appears moderately dilated. There are two persistent nonocclusive filling defects within the CBD compatible with choledocholithiasis, demonstrated on preceding cross-sectional imaging. Subsequent images demonstrate insufflation of a balloon within distal aspect of the CBD with presumed biliary sweeping, stone extraction and sphincterotomy. There is minimal opacification of the intrahepatic biliary tree which appears nondilated. There is no definitive opacification of either  the cystic or pancreatic ducts. IMPRESSION: ERCP with findings of choledocholithiasis with subsequent biliary sweeping, stone extraction and presumed sphincterotomy. These images were submitted for radiologic interpretation only. Please see the procedural  report for the amount of contrast and the fluoroscopy time utilized. Electronically Signed   By: Sandi Mariscal M.D.   On: 03/27/2021 09:07    GI Procedures and Studies  ERCP - Choledocholithiasis; treated with biliary sphincterotomy, balloon dilation of the sphincterotomy site and then balloon sweeping of the bile duct. - Medium sized pantaloon periampullary diverticulum which did not interfere with biliary cannulation. - Cystic duct did not opacify.   ASSESSMENT  Ms. Schoenfelder is a 87 y.o. female who presented with abdominal pain, leukocytosis, abnormal LFTs with imaging consistent with choledocholithiasis now status post ERCP.  The patient is evaluated today with her in hemodynamically and clinically stable.  She is already status postcholecystectomy.  No evidence of post ERCP pancreatitis.  LFTs already downtrending.  Discussed with patient to be on the look out for any signs/symptoms of progressive diarrhea that may occur now that she is status postcholecystectomy.  She has had diarrhea in the past does not use Imodium on a regular basis.  The patient will benefit from LFT repeat evaluation in the next 1 to 2 weeks after discharge (this can be done at postoperative clinic or with PCP).  Patient hopeful to be discharged in the next couple of days.  All patient questions were answered to the best of my ability, and the patient agrees to the aforementioned plan of action with follow-up as indicated.   PLAN/RECOMMENDATIONS  Monitor LFTs while inpatient Advance diet as tolerated as per surgical service LFTs should be repeated in 1 to 2 weeks upon discharge - PCP or at post operative surgical clinic   GI will sign off at this time but please page/call with questions or concerns.   Justice Britain, MD [facility] Office # [phone]    LOS: 2 days  Irving Copas  03/28/2021, 9:50 AM

## 2021-03-28 NOTE — Anesthesia Postprocedure Evaluation (Signed)
Anesthesia Post Note  Patient: Rebecca [name]  Procedure(s) Performed: LAPAROSCOPIC CHOLECYSTECTOMY (Abdomen)     Patient location during evaluation: PACU Anesthesia Type: General Level of consciousness: sedated Pain management: pain level controlled Vital Signs Assessment: post-procedure vital signs reviewed and stable Respiratory status: spontaneous breathing and respiratory function stable Cardiovascular status: stable Postop Assessment: no apparent nausea or vomiting Anesthetic complications: no   No notable events documented.  Last Vitals:  Vitals:   03/28/21 0930 03/28/21 1010  BP: (!) 172/63 (!) 161/64  Pulse: 60 (!) 50  Resp: 17 18  Temp:  36.5 C  SpO2: 94% 98%    Last Pain:  Vitals:   03/28/21 1010  TempSrc: Oral  PainSc:                  [place]

## 2021-03-28 NOTE — Op Note (Signed)
03/28/2021  8:36 AM  PATIENT:  Rebecca [name]  87 y.o. female  Patient Care Team: [name], MD as PCP - General (Family Medicine) Ladene Artist, MD as Consulting Physician (Gastroenterology) Aloha Gell, MD as Consulting Physician (Obstetrics and Gynecology) Luberta Mutter, MD as Consulting Physician (Ophthalmology) [name], [facility] as Pharmacist (Pharmacist)  PRE-OPERATIVE DIAGNOSIS:  ACUTE CHOLECYSTITIS, CHOLEDOCHOLITHIASIS  POST-OPERATIVE DIAGNOSIS:  ACUTE CHOLECYSTITIS, CHOLEDOCHOLITHIASIS  PROCEDURE:  LAPAROSCOPIC CHOLECYSTECTOMY    Surgeon(s): Leighton Ruff, MD  ASSISTANT: none   ANESTHESIA:   local and general  EBL:  No intake/output data recorded.  DRAINS: none   SPECIMEN:  Source of Specimen:  gallbladder  DISPOSITION OF SPECIMEN:  PATHOLOGY  COUNTS:  YES  PLAN OF CARE: Discharge to home after PACU  PATIENT DISPOSITION:  PACU - hemodynamically stable.  INDICATION:   The anatomy & physiology of hepatobiliary & pancreatic function was discussed.  The pathophysiology of gallbladder dysfunction was discussed.  Natural history risks without surgery was discussed.   I feel the risks of no intervention will lead to serious problems that outweigh the operative risks; therefore, I recommended cholecystectomy to remove the pathology.  I explained laparoscopic techniques with possible need for an open approach.  Probable cholangiogram to evaluate the bilary tract was explained as well.    Risks such as bleeding, infection, abscess, leak, injury to other organs, need for further treatment, heart attack, death, and other risks were discussed.  I noted a good likelihood this will help address the problem.  Possibility that this will not correct all abdominal symptoms was explained.  Goals of post-operative recovery were discussed as well.    OR FINDINGS: edematous gallbladder with enlarged pericystic lymph node  DESCRIPTION:   The patient was  identified & brought into the operating room. The patient was positioned supine with arms tucked. SCDs were active during the entire case. The patient underwent general anesthesia without any difficulty.  The abdomen was prepped and draped in a sterile fashion. A Surgical Timeout was performed and confirmed our plan.  We positioned the patient in reverse [place] & right side up.  I placed a Hassan laparoscopic port through the umbilicus using open entry technique.  Entry was clean. There were no adhesions to the anterior abdominal wall supraumbilically.  We induced carbon dioxide insufflation. Camera inspection revealed no injury.   I proceeded to continue with laparoscopic technique. I placed a 5 mm port in mid subcostal region, another 16mm port in the right flank near the anterior axillary line, and a 32mm port in the left subxiphoid region obliquely within the falciform ligament.  I turned attention to the right upper quadrant.   The gallbladder fundus was elevated cephalad. I used cautery and blunt dissection to free the peritoneal coverings between the gallbladder and the liver on the posteriolateral and anteriomedial walls.   I used careful blunt and cautery dissection with a blunt tip dissector to help get a good critical view of the cystic artery and cystic duct. I did further dissection to free a few centimeters of the  gallbladder off the liver bed to get a good critical view of the infundibulum and cystic duct. I mobilized the cystic artery.  I skeletonized the cystic duct.  After getting a good 360 view, I decided not to perform a cholangiogram.  I placed a clip on the infundibulum.  I placed clips on the cystic duct x3.  I completed cystic duct transection.   I placed clips on  the cystic artery x3 with 2 proximally.  I ligated the cystic artery using scissors. I freed the gallbladder from its remaining attachments to the liver. I ensured hemostasis on the gallbladder fossa of the liver and  elsewhere. I inspected the rest of the abdomen & detected no injury nor bleeding elsewhere.  I irrigated the RUQ with normal saline.  I removed the gallbladder through the umbilical port site.  I closed the umbilical fascia using 0 Vicryl stitches.   I closed the skin using 4-0 vicryl stitch.  Sterile dressings were applied. The patient was extubated & arrived in the PACU in stable condition.  I had discussed postoperative care with the patient in the holding area.   I will discuss  operative findings and postoperative goals / instructions with the patient's family.  Instructions are written in the chart as well.

## 2021-03-28 NOTE — Progress Notes (Signed)
Day of Surgery   Subjective/Chief Complaint: Patient post ERCP.  She is doing well without complaint.  Discussed surgery with her again today.  She would like to proceed with laparoscopic cholecystectomy while she is in the hospital.   Objective: Vital signs in last 24 hours: Temp:  [97.6 F (36.4 C)-99.1 F (37.3 C)] (P) 98.9 F (37.2 C) (12/25 0627) Pulse Rate:  [49-75] 49 (12/25 0630) Resp:  [17-25] 21 (12/25 0627) BP: (125-149)/(54-97) 149/58 (12/25 0630) SpO2:  [94 %-100 %] 94 % (12/25 0630) Weight:  [57.2 kg] 57.2 kg (12/25 0614) Last BM Date: 03/27/21  Intake/Output from previous day: 12/24 0701 - 12/25 0700 In: 340 [P.O.:240; IV Piggyback:100] Out: -  Intake/Output this shift: No intake/output data recorded.  General appearance: alert and cooperative Resp: clear to auscultation bilaterally Cardio: regular rate and rhythm GI: Soft nontender  Lab Results:  Recent Labs    03/26/21 0338 03/27/21 0546  WBC 23.0* 10.8*  HGB 9.8* 9.6*  HCT 28.3* 29.1*  PLT 184 154    BMET Recent Labs    03/26/21 0338 03/27/21 0546  NA 126* 135  K 4.0 3.3*  CL 99 106  CO2 22 22  GLUCOSE 128* 127*  BUN 11 10  CREATININE 0.87 0.86  CALCIUM 8.9 8.9    PT/INR Recent Labs    03/26/21 1111  LABPROT 16.5*  INR 1.3*    ABG No results for input(s): PHART, HCO3 in the last 72 hours.  Invalid input(s): PCO2, PO2  Studies/Results: DG ERCP  Result Date: 03/27/2021 CLINICAL DATA:  Choledocholithiasis. EXAM: ERCP TECHNIQUE: Multiple spot images obtained with the fluoroscopic device and submitted for interpretation post-procedure. FLUOROSCOPY TIME:  1 minute, 47 seconds (27 mGy) COMPARISON:  MRCP-03/26/2021; CT abdomen pelvis-03/25/2021 FINDINGS: Several spot fluoroscopic images of the right upper abdominal quadrant during ERCP are provided for review. Initial image demonstrates an ERCP probe overlying the right upper abdominal quadrant. Note is made of a moderate to severe  scoliotic curvature of the thoracolumbar spine. Subsequent images demonstrate selective cannulation and opacification of the CBD which appears moderately dilated. There are two persistent nonocclusive filling defects within the CBD compatible with choledocholithiasis, demonstrated on preceding cross-sectional imaging. Subsequent images demonstrate insufflation of a balloon within distal aspect of the CBD with presumed biliary sweeping, stone extraction and sphincterotomy. There is minimal opacification of the intrahepatic biliary tree which appears nondilated. There is no definitive opacification of either the cystic or pancreatic ducts. IMPRESSION: ERCP with findings of choledocholithiasis with subsequent biliary sweeping, stone extraction and presumed sphincterotomy. These images were submitted for radiologic interpretation only. Please see the procedural report for the amount of contrast and the fluoroscopy time utilized. Electronically Signed   By: Sandi Mariscal M.D.   On: 03/27/2021 09:07    Anti-infectives: Anti-infectives (From admission, onward)    Start     Dose/Rate Route Frequency Ordered Stop   03/28/21 0706  piperacillin-tazobactam (ZOSYN) 3.375 GM/50ML IVPB       Note to Pharmacy: Margurite Auerbach: cabinet override      03/28/21 0706 03/28/21 1914   03/26/21 2200  [MAR Hold]  piperacillin-tazobactam (ZOSYN) IVPB 3.375 g        (MAR Hold since Sun 03/28/2021 at 0626.Hold Reason: Transfer to a Procedural area)   3.375 g 12.5 mL/hr over 240 Minutes Intravenous Every 8 hours 03/26/21 1526     03/26/21 0230  piperacillin-tazobactam (ZOSYN) IVPB 3.375 g  Status:  Discontinued        3.375 g  12.5 mL/hr over 240 Minutes Intravenous Every 8 hours 03/26/21 2244 03/26/21 1526       Assessment/Plan: Choledocholithiasis status post successful ERCP  Cholelithiasis-recommend laparoscopic cholecystectomy during this admission.   The procedure has been discussed with the patient. Operative and non  operative treatments have been discussed. Risks of surgery include bleeding, infection,  Common bile duct injury,  Injury to the stomach,liver, colon,small intestine, abdominal wall,  Diaphragm,  Major blood vessels,  And the need for an open procedure.  Other risks include worsening of medical problems, death,  DVT and pulmonary embolism, and cardiovascular events.   Medical options have also been discussed. The patient has been informed of long term expectations of surgery and non surgical options,  The patient agrees to proceed.      LOS: 2 days    Rosario Adie 97/53/0051

## 2021-03-28 NOTE — Transfer of Care (Signed)
Immediate Anesthesia Transfer of Care Note  Patient: Rebecca [name]  Procedure(s) Performed: LAPAROSCOPIC CHOLECYSTECTOMY (Abdomen)  Patient Location: PACU  Anesthesia Type:General  Level of Consciousness: drowsy  Airway & Oxygen Therapy: Patient Spontanous Breathing and Patient connected to face mask  Post-op Assessment: Report given to RN and Post -op Vital signs reviewed and stable  Post vital signs: Reviewed and stable  Last Vitals:  Vitals Value Taken Time  BP    Temp    Pulse 73 03/28/21 0847  Resp 19 03/28/21 0847  SpO2 100 % 03/28/21 0847  Vitals shown include unvalidated device data.  Last Pain:  Vitals:   03/28/21 0530  TempSrc: Oral  PainSc:       Patients Stated Pain Goal: 0 (24/11/46 4314)  Complications: No notable events documented.

## 2021-03-28 NOTE — Progress Notes (Signed)
OT Cancellation Note  Patient Details Name: Rebecca [name] MRN: [id number] DOB: 09-20-1932   Cancelled Treatment:    Reason Eval/Treat Not Completed: Patient at procedure or test/ unavailable Patient is pending surgery this AM. OT to continue to follow and check back after surgery as schedule allows for updates to orders.   Jackelyn Poling OTR/L, [place] Acute [facility] Department Office# [phone] Pager# [phone]   03/28/2021, 6:09 AM

## 2021-03-28 NOTE — Progress Notes (Signed)
PT Cancellation Note  Patient Details Name: CHASMINE LENDER MRN: [id number] DOB: 03-12-1933   Cancelled Treatment:     PT order received but eval deferred - pt for surgery this am.  Will follow.   Tommie Dejoseph 03/28/2021, 6:43 AM

## 2021-03-28 NOTE — Anesthesia Preprocedure Evaluation (Addendum)
Anesthesia Evaluation  Patient identified by MRN, date of birth, ID band Patient awake    Reviewed: Allergy & Precautions, NPO status , Patient's Chart, lab work & pertinent test results  History of Anesthesia Complications Negative for: history of anesthetic complications  Airway Mallampati: II  TM Distance: >3 FB Neck ROM: Full    Dental no notable dental hx. (+) Dental Advisory Given   Pulmonary neg pulmonary ROS,    Pulmonary exam normal        Cardiovascular hypertension, + Peripheral Vascular Disease  Normal cardiovascular exam     Neuro/Psych PSYCHIATRIC DISORDERS Anxiety negative neurological ROS     GI/Hepatic Neg liver ROS, PUD, GERD  ,  Endo/Other  Hypothyroidism   Renal/GU negative Renal ROS     Musculoskeletal negative musculoskeletal ROS (+)   Abdominal   Peds  Hematology  (+) anemia ,   Anesthesia Other Findings   Reproductive/Obstetrics                            Anesthesia Physical  Anesthesia Plan  ASA: 3  Anesthesia Plan: General   Post-op Pain Management: Minimal or no pain anticipated   Induction: Intravenous  PONV Risk Score and Plan: 3 and Ondansetron, Dexamethasone and Treatment may vary due to age or medical condition  Airway Management Planned: Oral ETT  Additional Equipment: None  Intra-op Plan:   Post-operative Plan: Extubation in OR  Informed Consent: I have reviewed the patients History and Physical, chart, labs and discussed the procedure including the risks, benefits and alternatives for the proposed anesthesia with the patient or authorized representative who has indicated his/her understanding and acceptance.   Patient has DNR.  Discussed DNR with patient and Suspend DNR.   Dental advisory given  Plan Discussed with: Anesthesiologist, CRNA and Surgeon  Anesthesia Plan Comments:        Anesthesia Quick Evaluation

## 2021-03-28 NOTE — Progress Notes (Signed)
PROGRESS NOTE    Rebecca [name]  FGH:[id number] DOB: [birth year] DOA: 03/25/2021 PCP: [name], MD    Brief Narrative:  Rebecca [name] was admitted to the hospital with the working diagnosis of acute cholelithiasis with choledocholithiasis.  Now sp ERCP and cholecystectomy    87 yo female with the past medical history of HTN, chronic hyponatremia, GERD, and hypothyroidism who presented with abdominal pain, nausea and vomiting. She was diagnosed with biliary colic due gallbladder sludge in November 2022, and referred to surgical evaluation. Her appointment was scheduled for 03/30/21. Because of persistent and worsening symptoms she presented to the hospital for more urgent evaluation. On her initial physical examination her blood pressure was 157/97, HR 84, RR 18 and oxygen saturation 95% on room air. Lungs were clear to auscultation, heart with S1 and S2 present and rhythmic, abdomen soft and non tender to palpation, no lower extremity edema.   Sodium 128, potassium 4.1, chloride 98, bicarb 23, glucose 176, BUN 12, creatinine 0.9 alkaline phosphatase 174,, AST 356, ALT 294, total bilirubin presents to clinic 69 white count 27.4, hemoglobin 12.2, hematocrit 36.8, platelets 261. Urinalysis specific gravity 1.015.   CT of the abdomen is reported reticulosis with no diverticulitis. Mild prominent small bowel. Positive cholelithiasis with no definite complicating factors.  Ultrasonography cholelithiasis without sonographic evidence of acute cholecystitis.   MRCP with distended common bile duct with choledocholithiasis.  Cholelithiasis.  Hepatic cyst, bilateral renal cysts.   12/24 ERCP choledocholithiasis treated with biliary sphincterectomy, balloon dilatation of the sphincterotomy, balloon dilatation of the sphincterotomy site and the balloon sweeping of the bile duct. Cystic duct did not opacify.    12/25 cholecystectomy with no major complications.    Assessment & Plan:    Principal Problem:   Cholelithiasis Active Problems:   Hypothyroidism   Essential hypertension   GERD   Elevated LFTs   Chronic hyponatremia   Choledocholithiasis   Cholangitis   Malnutrition of moderate degree     Cholelithiasis complicated with choledocholithiasis. Cholangitis with severe sepsis  Patient had cholecystectomy this am with no major complications. Post op with no nausea or vomiting, but mild abdominal pain.   Plan to continue pain control with as needed morphine and nausea control with as needed ondansetron. Wbc is 20.3 from 10,8, likely component of reactive leukocytosis, but will continue antibiotic therapy for no with Zosyn.   For acute on chronic diarrhea continue as needed loperamide.   2. Hyponatremia/ hypokalemia. Moderate calorie protein malnutrition Renal function with serum cr at 0,90, K is 4,0 and serum bicarbonate at 21. Na is 134.  Continue close follow up on renal function and electrolytes. Discontinue IV fluids.    3. Hypothyroid.  Continue with levothyroxine   4. HTN.  Continue blood pressure monitoring, systolic blood pressure has been 155 to 161 mmHg    5. GERD.  On pantoprazole     6. Anxiety.  PRN alprazolam with good toleration    Status is: Inpatient  Remains inpatient appropriate because: Post operative care, follow wbc in am, possible dc home in am.    DVT prophylaxis:  Enoxaparin   Code Status:    full  Family Communication:   No family at the bedside      Nutrition Status: Nutrition Problem: Moderate Malnutrition Etiology: acute illness (biliary colic d/t gallbladder sludge) Signs/Symptoms: moderate fat depletion, moderate muscle depletion Interventions: Boost Plus, MVI   Consultants:  GI  Surgery   Procedures:  ERCP  Cholecystectomy   Antimicrobials:  Zosyn     Subjective: Patient with no nausea or vomiting, continue to have mild post operative abdominal pain, no dyspnea or chest  pain  Objective: Vitals:   03/28/21 0917 03/28/21 0930 03/28/21 1010 03/28/21 1349  BP:  (!) 172/63 (!) 161/64 (!) 155/61  Pulse: 61 60 (!) 50 76  Resp: 16 17 18 19   Temp:   97.7 F (36.5 C) (!) 97.5 F (36.4 C)  TempSrc:   Oral Oral  SpO2: 92% 94% 98% 97%  Weight:      Height:        Intake/Output Summary (Last 24 hours) at 03/28/2021 1449 Last data filed at 03/28/2021 0930 Gross per 24 hour  Intake 150 ml  Output 10 ml  Net 140 ml   Filed Weights   03/26/21 0105 03/27/21 0538 03/28/21 0614  Weight: 52 kg 57.1 kg 57.2 kg    Examination:   General: Not in pain or dyspnea, deconditioned  Neurology: Awake and alert, non focal  E ENT: no pallor, no icterus, oral mucosa moist Cardiovascular: No JVD. S1-S2 present, rhythmic, no gallops, rubs, or murmurs. No lower extremity edema. Pulmonary: positive breath sounds bilaterally, with no wheezing, rhonchi or rales. Gastrointestinal. Abdomen soft and non distended, not tender to superficial palpation Skin. No rashes Musculoskeletal: no joint deformities     Data Reviewed: I have personally reviewed following labs and imaging studies  CBC: Recent Labs  Lab 03/25/21 1557 03/26/21 0338 03/27/21 0546 03/28/21 0909  WBC 27.4* 23.0* 10.8* 20.3*  NEUTROABS 8.3* 7.7  --   --   HGB 12.2 9.8* 9.6* 10.1*  HCT 36.8 28.3* 29.1* 31.3*  MCV 100.8* 99.3 102.8* 105.0*  PLT 261 184 154 419   Basic Metabolic Panel: Recent Labs  Lab 03/25/21 1557 03/26/21 0338 03/27/21 0546 03/28/21 0909  NA 128* 126* 135 134*  K 4.1 4.0 3.3* 4.0  CL 98 99 106 107  CO2 23 22 22  21*  GLUCOSE 176* 128* 127* 128*  BUN 12 11 10 8   CREATININE 0.98 0.87 0.86 0.90  CALCIUM 9.7 8.9 8.9 9.0  MG  --   --   --  1.8   GFR: Estimated Creatinine Clearance: 36.3 mL/min (by C-G formula based on SCr of 0.9 mg/dL). Liver Function Tests: Recent Labs  Lab 03/25/21 1557 03/26/21 0338 03/27/21 0546 03/28/21 0909  AST 356* 169* 69* 42*  ALT 294* 216*  134* 95*  ALKPHOS 174* 124 117 102  BILITOT 2.6* 2.9* 1.0 1.0  PROT 6.8 5.1* 5.2* 5.4*  ALBUMIN 3.9 2.9* 3.0* 3.1*   Recent Labs  Lab 03/25/21 1557  LIPASE 26   No results for input(s): AMMONIA in the last 168 hours. Coagulation Profile: Recent Labs  Lab 03/26/21 1111  INR 1.3*   Cardiac Enzymes: No results for input(s): CKTOTAL, CKMB, CKMBINDEX, TROPONINI in the last 168 hours. BNP (last 3 results) No results for input(s): PROBNP in the last 8760 hours. HbA1C: No results for input(s): HGBA1C in the last 72 hours. CBG: No results for input(s): GLUCAP in the last 168 hours. Lipid Profile: No results for input(s): CHOL, HDL, LDLCALC, TRIG, CHOLHDL, LDLDIRECT in the last 72 hours. Thyroid Function Tests: Recent Labs    03/26/21 0338  TSH 0.766  FREET4 1.43*   Anemia Panel: No results for input(s): VITAMINB12, FOLATE, FERRITIN, TIBC, IRON, RETICCTPCT in the last 72 hours.    Radiology Studies: I have reviewed all of the imaging during this hospital visit personally  Scheduled Meds:  fentaNYL       lactose free nutrition  237 mL Oral TID WC   latanoprost  1 drop Both Eyes QHS   levothyroxine  88 mcg Oral Q0600   multivitamin with minerals  1 tablet Oral Daily   mupirocin ointment  1 application Nasal BID   pantoprazole  40 mg Oral Daily   phenylephrine       timolol  1 drop Both Eyes BID   Continuous Infusions:  piperacillin-tazobactam (ZOSYN)  IV 3.375 g (03/28/21 1432)     LOS: 2 days        Trong Gosling Gerome Apley, MD

## 2021-03-29 ENCOUNTER — Encounter (HOSPITAL_COMMUNITY): Payer: Self-pay | Admitting: General Surgery

## 2021-03-29 LAB — CBC
HCT: 31.1 % — ABNORMAL LOW (ref 36.0–46.0)
Hemoglobin: 10.5 g/dL — ABNORMAL LOW (ref 12.0–15.0)
MCH: 34.2 pg — ABNORMAL HIGH (ref 26.0–34.0)
MCHC: 33.8 g/dL (ref 30.0–36.0)
MCV: 101.3 fL — ABNORMAL HIGH (ref 80.0–100.0)
Platelets: 203 10*3/uL (ref 150–400)
RBC: 3.07 MIL/uL — ABNORMAL LOW (ref 3.87–5.11)
RDW: 13.1 % (ref 11.5–15.5)
WBC: 27.7 10*3/uL — ABNORMAL HIGH (ref 4.0–10.5)
nRBC: 0 % (ref 0.0–0.2)

## 2021-03-29 LAB — BASIC METABOLIC PANEL
Anion gap: 5 (ref 5–15)
BUN: 9 mg/dL (ref 8–23)
CO2: 23 mmol/L (ref 22–32)
Calcium: 9 mg/dL (ref 8.9–10.3)
Chloride: 104 mmol/L (ref 98–111)
Creatinine, Ser: 1.01 mg/dL — ABNORMAL HIGH (ref 0.44–1.00)
GFR, Estimated: 54 mL/min — ABNORMAL LOW (ref >60)
Glucose, Bld: 89 mg/dL (ref 70–99)
Potassium: 3.8 mmol/L (ref 3.5–5.1)
Sodium: 132 mmol/L — ABNORMAL LOW (ref 135–145)

## 2021-03-29 MED ORDER — ENOXAPARIN SODIUM 40 MG/0.4ML IJ SOSY
40.0000 mg | PREFILLED_SYRINGE | INTRAMUSCULAR | Status: DC
  Administered 2021-03-29 – 2021-03-31 (×3): 40 mg via SUBCUTANEOUS
  Filled 2021-03-29 (×3): qty 0.4

## 2021-03-29 MED ORDER — LOSARTAN POTASSIUM 50 MG PO TABS
25.0000 mg | ORAL_TABLET | Freq: Every day | ORAL | Status: DC
  Administered 2021-03-29 – 2021-04-01 (×4): 25 mg via ORAL
  Filled 2021-03-29 (×4): qty 1

## 2021-03-29 NOTE — Evaluation (Signed)
Physical Therapy Evaluation Patient Details Name: Rebecca [name] MRN: [id number] DOB: 11/13/1932 Today's Date: 03/29/2021  History of Present Illness  87 yo female with the past medical history of HTN, chronic hyponatremia, GERD, and hypothyroidism who presented 03/25/21  with worsening abdominal pain, nausea and vomiting. She was diagnosed with biliary colic due gallbladder sludge in November 2022, and referred to surgical evaluation.  Clinical Impression  The patient  resides  independently, daughter plans to stay with patient at DC. Daughter reports patient with noted decreased balance PTA, patient uses a SPC and a crutch. Patient now will benefit from a Rw for safe ambulation. Recommend HHPT. Patient most like  for DC today. Patient can ambulate with Staff at this time. PT anticipates DC home soon. PT signing off.     Recommendations for follow up therapy are one component of a multi-disciplinary discharge planning process, led by the attending physician.  Recommendations may be updated based on patient status, additional functional criteria and insurance authorization.  Follow Up Recommendations Home health PT    Assistance Recommended at Discharge Intermittent Supervision/Assistance  Functional Status Assessment Patient has had a recent decline in their functional status and demonstrates the ability to make significant improvements in function in a reasonable and predictable amount of time.  Equipment Recommendations  Rolling walker (2 wheels)    Recommendations for Other Services       Precautions / Restrictions Precautions Precautions: Fall Restrictions Weight Bearing Restrictions: No      Mobility  Bed Mobility Overal bed mobility: Needs Assistance (Simultaneous filing. User may not have seen previous data.) Bed Mobility: Supine to Sit (Simultaneous filing. User may not have seen previous data.) Rolling: Supervision Sidelying to sit: Supervision;HOB elevated   Sit  to supine: Supervision;HOB elevated   General bed mobility comments: extra  time, guarding    Transfers Overall transfer level: Needs assistance Equipment used: Rolling walker (2 wheels) Transfers: Sit to/from Stand Sit to Stand: Min guard           General transfer comment: from bed, cues for safety    Ambulation/Gait Ambulation/Gait assistance: Min assist;Min guard Gait Distance (Feet): 100 Feet Assistive device: Rolling walker (2 wheels);None Gait Pattern/deviations: Step-to pattern;Step-through pattern;Leaning posteriorly Gait velocity: decr     General Gait Details: while ambulating from bed to BR using SPC, patient noted with posterior balance loss. Therapist present to steady patient. patient used RW to ambulate down the hall  Financial trader Rankin (Stroke Patients Only)       Balance Overall balance assessment: Needs assistance Sitting-balance support: Feet supported;No upper extremity supported Sitting balance-Leahy Scale: Good     Standing balance support: During functional activity;No upper extremity supported;Reliant on assistive device for balance Standing balance-Leahy Scale: Poor Standing balance comment: reliant  on UE support                             Pertinent Vitals/Pain Pain Assessment: Faces Faces Pain Scale: Hurts little more Pain Location: abdomen when  mobilizing    Home Living Family/patient expects to be discharged to:: Private residence Living Arrangements: Alone Available Help at Discharge: Family;Available PRN/intermittently Type of Home: House Home Access: Stairs to enter   Entrance Stairs-Number of Steps: 2 +1   Home Layout: One level   Additional Comments: daughter to stay 24/7 with patient at time of d/c.  Prior Function               Mobility Comments: patient used cane occasionally for distances. uses a crutch at night to amb to BR" I want to be safe at  night in the dark" ADLs Comments: I ADLs. still driving     Hand Dominance   Dominant Hand: Right    Extremity/Trunk Assessment   Upper Extremity Assessment Upper Extremity Assessment: Generalized weakness    Lower Extremity Assessment Lower Extremity Assessment: Generalized weakness    Cervical / Trunk Assessment Cervical / Trunk Assessment: Normal  Communication   Communication: No difficulties  Cognition Arousal/Alertness: Awake/alert Behavior During Therapy: WFL for tasks assessed/performed Overall Cognitive Status: Within Functional Limits for tasks assessed                                          General Comments      Exercises     Assessment/Plan    PT Assessment All further PT needs can be met in the next venue of care  PT Problem List Decreased mobility;Decreased safety awareness;Decreased activity tolerance;Decreased balance       PT Treatment Interventions      PT Goals (Current goals can be found in the Care Plan section)  Acute Rehab PT Goals Patient Stated Goal: go home PT Goal Formulation: With patient/family    Frequency     Barriers to discharge        Co-evaluation               AM-PAC PT "6 Clicks" Mobility  Outcome Measure Help needed turning from your back to your side while in a flat bed without using bedrails?: None Help needed moving from lying on your back to sitting on the side of a flat bed without using bedrails?: None Help needed moving to and from a bed to a chair (including a wheelchair)?: None Help needed standing up from a chair using your arms (e.g., wheelchair or bedside chair)?: A Little Help needed to walk in hospital room?: A Little Help needed climbing 3-5 steps with a railing? : A Little 6 Click Score: 21    End of Session   Activity Tolerance: Patient tolerated treatment well Patient left: in bed;with call bell/phone within reach;with bed alarm set;with family/visitor present Nurse  Communication: Mobility status PT Visit Diagnosis: Unsteadiness on feet (R26.81);Difficulty in walking, not elsewhere classified (R26.2)    Time: 9198-0221 PT Time Calculation (min) (ACUTE ONLY): 22 min   Charges:   PT Evaluation $PT Eval Low Complexity: [place] PT Acute Rehabilitation Services Pager [phone] Office [phone]   Claretha Cooper 03/29/2021, 12:58 PM

## 2021-03-29 NOTE — TOC Initial Note (Signed)
Transition of Care [facility]) - Initial/Assessment Note    Patient Details  Name: Rebecca [name] MRN: [id number] Date of Birth: 10-10-1932  [facility] CM/SW Contact:    Lynnell Catalan, RN Phone Number: 03/29/2021, 2:46 PM  Clinical Narrative:                   Expected Discharge Plan: [place] Services Barriers to Discharge: Continued Medical Work up   Patient Goals and CMS Choice   CMS Medicare.gov Compare Post Acute Care list provided to:: Patient Choice offered to / list presented to : Patient  Expected Discharge Plan and Services Expected Discharge Plan: [place]   Discharge Planning Services: CM Consult Post Acute Care Choice: [place] arrangements for the past 2 months: Single Family Home                 DME Arranged: Walker rolling DME Agency: [employer] Date DME Agency Contacted: 03/29/21 Time DME Agency Contacted: [phone]   [place] Arranged: PT HH Agency: [place] Date HH Agency Contacted: 03/29/21 Time HH Agency Contacted: [phone] Representative spoke with at [place]: Tommi Rumps  Prior Living Arrangements/Services Living arrangements for the past 2 months: [place] with:: Self Patient language and need for interpreter reviewed:: Yes Do you feel safe going back to the place where you live?: Yes      Need for Family Participation in Patient Care: Yes (Comment) Care giver support system in place?: Yes (comment)   Criminal Activity/Legal Involvement Pertinent to Current Situation/Hospitalization: Yes - Comment as needed  Activities of Daily Living Home Assistive Devices/Equipment: Eyeglasses, Hearing aid, Cane (specify quad or straight) (bilateral hearing aides) ADL Screening (condition at time of admission) Patient's cognitive ability adequate to safely complete daily activities?: Yes Is the patient deaf or have difficulty hearing?: Yes Does the patient have difficulty seeing, even  when wearing glasses/contacts?: No Does the patient have difficulty concentrating, remembering, or making decisions?: No Patient able to express need for assistance with ADLs?: Yes Does the patient have difficulty dressing or bathing?: No Independently performs ADLs?: Yes (appropriate for developmental age) Does the patient have difficulty walking or climbing stairs?: Yes (uses a cane to go up steps) Weakness of Legs: Both Weakness of Arms/Hands: Both  Permission Sought/Granted   Permission granted to share information with : Yes, Verbal Permission Granted     Permission granted to share info w AGENCY: [facility]        Emotional Assessment Appearance:: Appears stated age Attitude/Demeanor/Rapport: Gracious Affect (typically observed): Calm Orientation: : Oriented to Self, Oriented to Place, Oriented to  Time, Oriented to Situation Alcohol / Substance Use: Not Applicable Psych Involvement: No (comment)  Admission diagnosis:  Hyperbilirubinemia [E80.6] RUQ abdominal pain [R10.11] Transaminitis [R74.01] Cholelithiasis [K80.20] Calculus of gallbladder with biliary obstruction but without cholecystitis [K80.21] Cholangitis [K83.09] Patient Active Problem List   Diagnosis Date Noted   Malnutrition of moderate degree 03/27/2021   Choledocholithiasis 03/26/2021   Cholangitis 03/26/2021   Cholelithiasis 03/25/2021   Elevated LFTs 03/25/2021   Chronic hyponatremia 03/25/2021   Bilateral primary osteoarthritis of knee 06/11/2019   Pain in left shoulder 05/22/2019   Unspecified vitamin D deficiency 12/25/2013   Atherosclerosis 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 04/20/2011   SPINAL STENOSIS, LUMBAR 03/09/2010   OTHER OSTEOPOROSIS 03/09/2010   SCOLIOSIS, LUMBAR SPINE 03/09/2010   MIGRAINE, OPHTHALMIC 10/29/2009   LUMBAR RADICULOPATHY, ATYPICAL 10/29/2009   POLYNEUROPATHY  OTHER DISEASES CLASSIFIED ELSW 09/22/2009   UNSPECIFIED PERIPHERAL VASCULAR  DISEASE 09/22/2009   ADENOMATOUS COLONIC POLYP 06/10/2009   History of iron deficiency 05/06/2009   CAROTID ARTERY DISEASE 07/28/2008   Allergic rhinitis 03/20/2008   DYSPHAGIA 03/12/2008   GANGLION OF TENDON SHEATH 08/17/2007   Hypothyroidism 11/15/2006   Hyperlipidemia 11/07/2006   ADVEF, DRUG/MEDICINAL/BIOLOGICAL SUBST NOS 11/07/2006   Essential hypertension 10/18/2006   GERD 10/18/2006   DIVERTICULOSIS, COLON 10/18/2006   PCP:  [name], MD Pharmacy:   [facility] [address], [address] [place] [address] Phone: [phone] Fax: [phone]     Social Determinants of Health (SDOH) Interventions    Readmission Risk Interventions No flowsheet data found.

## 2021-03-29 NOTE — Progress Notes (Signed)
° ° °  Assessment & Plan: POD#1 - status post lap cholecystectomy - Dr. Leighton Ruff  Tolerating soft diet  Mild periumbilical pain  Wounds dry and intact  Daughter at bedside  OK for discharge home from surgical standpoint.  Will arrange follow up at [place] office 2 weeks.  Will sign off.  Call if needed.        Armandina Gemma, MD       [facility]       Office: [phone]   Chief Complaint: Choledocholithiasis  Subjective: Patient in bed, family at bedside.  Complains of diarrhea.  Objective: Vital signs in last 24 hours: Temp:  [97.5 F (36.4 C)-98.5 F (36.9 C)] 97.9 F (36.6 C) (12/26 0539) Pulse Rate:  [54-76] 56 (12/26 0539) Resp:  [14-19] 14 (12/26 0539) BP: (137-168)/(61-79) 154/64 (12/26 0539) SpO2:  [95 %-99 %] 95 % (12/26 0539) Weight:  [58.5 kg-60.5 kg] 60.5 kg (12/26 0539) Last BM Date: 03/27/21  Intake/Output from previous day: 12/25 0701 - 12/26 0700 In: 50 [I.V.:50] Out: 10 [Blood:10] Intake/Output this shift: No intake/output data recorded.  Physical Exam: HEENT - sclerae clear, mucous membranes moist Neck - soft Abdomen - protuberant, soft; wounds dry and intact Ext - no edema, non-tender Neuro - alert & oriented, no focal deficits  Lab Results:  Recent Labs    03/28/21 0909 03/29/21 0527  WBC 20.3* 27.7*  HGB 10.1* 10.5*  HCT 31.3* 31.1*  PLT 174 203   BMET Recent Labs    03/28/21 0909 03/29/21 0527  NA 134* 132*  K 4.0 3.8  CL 107 104  CO2 21* 23  GLUCOSE 128* 89  BUN 8 9  CREATININE 0.90 1.01*  CALCIUM 9.0 9.0   PT/INR No results for input(s): LABPROT, INR in the last 72 hours. Comprehensive Metabolic Panel:    Component Value Date/Time   NA 132 (L) 03/29/2021 0527   NA 134 (L) 03/28/2021 0909   K 3.8 03/29/2021 0527   K 4.0 03/28/2021 0909   CL 104 03/29/2021 0527   CL 107 03/28/2021 0909   CO2 23 03/29/2021 0527   CO2 21 (L) 03/28/2021 0909   BUN 9 03/29/2021 0527   BUN 8 03/28/2021 0909    CREATININE 1.01 (H) 03/29/2021 0527   CREATININE 0.90 03/28/2021 0909   CREATININE 0.90 (H) 03/20/2020 1121   GLUCOSE 89 03/29/2021 0527   GLUCOSE 128 (H) 03/28/2021 0909   CALCIUM 9.0 03/29/2021 0527   CALCIUM 9.0 03/28/2021 0909   AST 42 (H) 03/28/2021 0909   AST 69 (H) 03/27/2021 0546   ALT 95 (H) 03/28/2021 0909   ALT 134 (H) 03/27/2021 0546   ALKPHOS 102 03/28/2021 0909   ALKPHOS 117 03/27/2021 0546   BILITOT 1.0 03/28/2021 0909   BILITOT 1.0 03/27/2021 0546   PROT 5.4 (L) 03/28/2021 0909   PROT 5.2 (L) 03/27/2021 0546   ALBUMIN 3.1 (L) 03/28/2021 0909   ALBUMIN 3.0 (L) 03/27/2021 0546    Studies/Results: No results found.    Armandina Gemma 03/29/2021   Patient ID: Rebecca [name], female   DOB: 1932/08/31, 87 y.o.   MRN: [id number]

## 2021-03-29 NOTE — Evaluation (Signed)
Occupational Therapy Evaluation Patient Details Name: Rebecca [name] MRN: [id number] DOB: 10/22/1932 Today's Date: 03/29/2021   History of Present Illness 87 yo female with the past medical history of HTN, chronic hyponatremia, GERD, and hypothyroidism who presented 03/25/21  with worsening abdominal pain, nausea and vomiting. She was diagnosed with biliary colic due gallbladder sludge in November 2022, and referred to surgical evaluation.   Clinical Impression   Patient is a 87 year old female who was noted to have had a functional decline secondary to admission diagnosis above. Patient was living at home alone at cane level prior to admission. Currently, patient is min guard with min A to maintain standing balance with random LOB during session while at cane level for toileting tasks. Patient was noted to be much steadier with RW with continued min guard but no LOB. Patients children plan to be with patient for as long as needed at time of d/c. Patient would benefit from skilled OT services in next level of care. Patient would continue to benefit from skilled OT services at this time while admitted and after d/c to address noted deficits in order to improve overall safety and independence in ADLs.       Recommendations for follow up therapy are one component of a multi-disciplinary discharge planning process, led by the attending physician.  Recommendations may be updated based on patient status, additional functional criteria and insurance authorization.   Follow Up Recommendations  Home health OT    Assistance Recommended at Discharge Frequent or constant Supervision/Assistance  Functional Status Assessment  Patient has had a recent decline in their functional status and demonstrates the ability to make significant improvements in function in a reasonable and predictable amount of time.  Equipment Recommendations  Other (comment) (RW)    Recommendations for Other Services        Precautions / Restrictions Precautions Precautions: Fall Restrictions Weight Bearing Restrictions: No      Mobility Bed Mobility Overal bed mobility: Needs Assistance Bed Mobility: Supine to Sit Rolling: Supervision Sidelying to sit: Supervision;HOB elevated   Sit to supine: Supervision;HOB elevated   General bed mobility comments: extra  time, guarding    Transfers Overall transfer level: Needs assistance Equipment used: Rolling walker (2 wheels) Transfers: Sit to/from Stand Sit to Stand: Min guard           General transfer comment: from bed, cues for safety      Balance Overall balance assessment: Needs assistance Sitting-balance support: Feet supported;No upper extremity supported Sitting balance-Leahy Scale: Good     Standing balance support: During functional activity;No upper extremity supported;Reliant on assistive device for balance Standing balance-Leahy Scale: Poor Standing balance comment: reliant  on UE support                           ADL either performed or assessed with clinical judgement   ADL Overall ADL's : Needs assistance/impaired Eating/Feeding: Set up;Sitting   Grooming: Oral care;Wash/dry face;Sitting;Set up Grooming Details (indicate cue type and reason): patient noted to have random LOB while standing with attemtps to use cane. Upper Body Bathing: Min guard;Sitting   Lower Body Bathing: Min guard;Sitting/lateral leans Lower Body Bathing Details (indicate cue type and reason): unable to reach perineal area in standing without LOB Upper Body Dressing : Min guard;Sitting   Lower Body Dressing: Min guard;Sitting/lateral leans;Sit to/from stand Lower Body Dressing Details (indicate cue type and reason): for standing to pull up pants with noted  random LOB Toilet Transfer: Magazine features editor Details (indicate cue type and reason): patient was noted to complete functional mobility to bathroom with cane and a few steps  prior to reaching toilet, patient was noted to stop and take a few shuffle steps sideways. patient was able to maintain standing with min A. patient reported she does this sometimes. daughter in room reported the same. patient was noted to have no episodes when using RW. educated patient on using RW Toileting- Water quality scientist and Hygiene: Min guard;Sit to/from stand;Sitting/lateral lean Toileting - Clothing Manipulation Details (indicate cue type and reason): able to complete MI in seated position     Functional mobility during ADLs: Min guard;Rolling walker (2 wheels)       Vision Baseline Vision/History: 1 Wears glasses Ability to See in Adequate Light: 1 Impaired Patient Visual Report: No change from baseline       Perception     Praxis      Pertinent Vitals/Pain Pain Assessment: Faces Faces Pain Scale: Hurts little more Pain Location: abdomen when  mobilizing     Hand Dominance Right   Extremity/Trunk Assessment Upper Extremity Assessment Upper Extremity Assessment: Generalized weakness   Lower Extremity Assessment Lower Extremity Assessment: Defer to PT evaluation   Cervical / Trunk Assessment Cervical / Trunk Assessment: Normal   Communication Communication Communication: No difficulties   Cognition Arousal/Alertness: Awake/alert Behavior During Therapy: WFL for tasks assessed/performed Overall Cognitive Status: Within Functional Limits for tasks assessed                                       General Comments       Exercises     Shoulder Instructions      Home Living Family/patient expects to be discharged to:: Private residence Living Arrangements: Alone Available Help at Discharge: Family;Available PRN/intermittently Type of Home: House Home Access: Stairs to enter [employer] of Steps: 2 +1   Home Layout: One level     Bathroom Shower/Tub: Tub/shower unit             Additional Comments: daughter to stay  24/7 with patient at time of d/c.      Prior Functioning/Environment               Mobility Comments: patient used cane occasionally for distances. uses a crutch at night to amb to BR" I want to be safe at night in the dark" ADLs Comments: I ADLs. still driving        OT Problem List: Decreased activity tolerance;Impaired balance (sitting and/or standing);Decreased safety awareness;Decreased knowledge of use of DME or AE;Decreased knowledge of precautions      OT Treatment/Interventions: Self-care/ADL training;Neuromuscular education;Energy conservation;DME and/or AE instruction;Therapeutic activities;Balance training;Patient/family education    OT Goals(Current goals can be found in the care plan section) Acute Rehab OT Goals Patient Stated Goal: to go home today OT Goal Formulation: With patient/family Time For Goal Achievement: 04/12/21 Potential to Achieve Goals: Good  OT Frequency: Min 2X/week   Barriers to D/C: Decreased caregiver support  typically lives at home alone but children are planning to assist patient at time of d/c.       Co-evaluation PT/OT/SLP Co-Evaluation/Treatment: Yes Reason for Co-Treatment: To address functional/ADL transfers PT goals addressed during session: Mobility/safety with mobility OT goals addressed during session: ADL's and self-care      AM-PAC OT "6 Clicks" Daily Activity  Outcome Measure Help from another person eating meals?: None Help from another person taking care of personal grooming?: None Help from another person toileting, which includes using toliet, bedpan, or urinal?: A Little Help from another person bathing (including washing, rinsing, drying)?: A Little Help from another person to put on and taking off regular upper body clothing?: A Little Help from another person to put on and taking off regular lower body clothing?: A Little 6 Click Score: 20   End of Session Equipment Utilized During Treatment: Gait  belt;Rolling walker (2 wheels) Nurse Communication: Mobility status  Activity Tolerance: Patient tolerated treatment well Patient left: in bed;with call bell/phone within reach;with family/visitor present  OT Visit Diagnosis: Unsteadiness on feet (R26.81);Muscle weakness (generalized) (M62.81)                Time: 0768-0881 OT Time Calculation (min): 22 min Charges:  OT General Charges $OT Visit: 1 Visit OT Evaluation $OT Eval Low Complexity: 1 Low  Leota Sauers, MS Acute Rehabilitation Department Office# [phone] Pager# [phone]   Marcellina Millin 03/29/2021, 1:20 PM

## 2021-03-29 NOTE — Progress Notes (Signed)
PROGRESS NOTE    Rebecca [name]  NFA:[id number] DOB: 05/24/1932 DOA: 03/25/2021 PCP: [name], MD    Brief Narrative:  87 yo female with the past medical history of HTN, chronic hyponatremia, GERD, and hypothyroidism who presented with abdominal pain, nausea and vomiting. She was diagnosed with biliary colic due gallbladder sludge in November 2022, and referred to surgical evaluation. Her appointment was scheduled for 03/30/21. Because of persistent and worsening symptoms she presented to the hospital for more urgent evaluation. In the ED, VSS, labs notable for alkaline phosphatase 174, AST 356, ALT 294, white count 27.4. CT of the abdomen positive for cholelithiasis, USS showed cholelithiasis without sonographic evidence of acute cholecystitis. MRCP with distended common bile duct with choledocholithiasis. ERCP done on 12/24 with biliary sphincterectomy, balloon dilatation of the sphincterotomy site and the balloon sweeping of the bile duct. Pt subsequently had lap chole on 12/25.     Today, patient reported having diarrhea, likely associated with antibiotics, denies any significant generalized abdominal pain except for some tenderness around lap chole incision sites, denies any chest pain, shortness of breath, nausea/vomiting, fever/chills.  WBC noted to be elevated to about 27.  Discussed with daughter at bedside.     Assessment & Plan:   Principal Problem:   Cholelithiasis Active Problems:   Hypothyroidism   Essential hypertension   GERD   Elevated LFTs   Chronic hyponatremia   Choledocholithiasis   Cholangitis   Malnutrition of moderate degree   Cholelithiasis complicated with choledocholithiasis Possible cholangitis with severe sepsis  Currently afebrile with worsening leukocytosis (worsened by ?recent surgery) S/p ERCP done on 12/24 with biliary sphincterectomy, balloon dilatation of the sphincterotomy site and the balloon sweeping of the bile duct Pt  subsequently had lap chole on 12/25 Worsening leukocytosis, likely component of reactive leukocytosis, but will continue antibiotic therapy for now with Zosyn and monitor closely Procalcitonin pending Continue IV Zosyn Monitor closely  Acute on chronic diarrhea  Likely antibiotic related, will monitor closely Continue as needed loperamide for now  Anemia likely due to recent surgery Monitor closely Daily CBC  Hyponatremia Sodium fluctuating, monitor closely Daily BMP   Hypothyroidism Continue with levothyroxine   HTN BP stable, monitor closely   GERD On pantoprazole     Anxiety PRN alprazolam      Status is: Inpatient  Remains inpatient appropriate because: Level of care  DVT prophylaxis:  Enoxaparin   Code Status:    DNR Family Communication:  Daughter at the bedside      Nutrition Status: Nutrition Problem: Moderate Malnutrition Etiology: acute illness (biliary colic d/t gallbladder sludge) Signs/Symptoms: moderate fat depletion, moderate muscle depletion Interventions: Boost Plus, MVI   Consultants:  GI  Gen surgery   Procedures:  ERCP  Cholecystectomy   Antimicrobials:   Zosyn     Subjective: Patient continues to have diarrhea, likely secondary to antibiotic use.  Patient denies any other new complaints except for mild postop pain around the incision sites.    Objective: Vitals:   03/28/21 1738 03/28/21 2027 03/29/21 0500 03/29/21 0539  BP: (!) 168/79 137/69  (!) 154/64  Pulse: (!) 54 (!) 56  (!) 56  Resp: 19 14  14   Temp: 98.5 F (36.9 C) 98.2 F (36.8 C)  97.9 F (36.6 C)  TempSrc: Oral Oral  Oral  SpO2: 99% 98%  95%  Weight:   58.5 kg 60.5 kg  Height:       No intake or output data in the 24 hours  ending 03/29/21 1432  Filed Weights   03/28/21 0614 03/29/21 0500 03/29/21 0539  Weight: 57.2 kg 58.5 kg 60.5 kg    Examination:  General: NAD  Cardiovascular: S1, S2 present Respiratory: CTAB Abdomen: Soft, nontender,  nondistended, bowel sounds present, incision sites C/D/I Musculoskeletal: No bilateral pedal edema noted Skin: Normal Psychiatry: Normal mood      Data Reviewed: I have personally reviewed following labs and imaging studies  CBC: Recent Labs  Lab 03/25/21 1557 03/26/21 0338 03/27/21 0546 03/28/21 0909 03/29/21 0527  WBC 27.4* 23.0* 10.8* 20.3* 27.7*  NEUTROABS 8.3* 7.7  --   --   --   HGB 12.2 9.8* 9.6* 10.1* 10.5*  HCT 36.8 28.3* 29.1* 31.3* 31.1*  MCV 100.8* 99.3 102.8* 105.0* 101.3*  PLT 261 184 154 174 094   Basic Metabolic Panel: Recent Labs  Lab 03/25/21 1557 03/26/21 0338 03/27/21 0546 03/28/21 0909 03/29/21 0527  NA 128* 126* 135 134* 132*  K 4.1 4.0 3.3* 4.0 3.8  CL 98 99 106 107 104  CO2 23 22 22  21* 23  GLUCOSE 176* 128* 127* 128* 89  BUN 12 11 10 8 9   CREATININE 0.98 0.87 0.86 0.90 1.01*  CALCIUM 9.7 8.9 8.9 9.0 9.0  MG  --   --   --  1.8  --    GFR: Estimated Creatinine Clearance: 32.4 mL/min (A) (by C-G formula based on SCr of 1.01 mg/dL (H)). Liver Function Tests: Recent Labs  Lab 03/25/21 1557 03/26/21 0338 03/27/21 0546 03/28/21 0909  AST 356* 169* 69* 42*  ALT 294* 216* 134* 95*  ALKPHOS 174* 124 117 102  BILITOT 2.6* 2.9* 1.0 1.0  PROT 6.8 5.1* 5.2* 5.4*  ALBUMIN 3.9 2.9* 3.0* 3.1*   Recent Labs  Lab 03/25/21 1557  LIPASE 26   No results for input(s): AMMONIA in the last 168 hours. Coagulation Profile: Recent Labs  Lab 03/26/21 1111  INR 1.3*   Cardiac Enzymes: No results for input(s): CKTOTAL, CKMB, CKMBINDEX, TROPONINI in the last 168 hours. BNP (last 3 results) No results for input(s): PROBNP in the last 8760 hours. HbA1C: No results for input(s): HGBA1C in the last 72 hours. CBG: No results for input(s): GLUCAP in the last 168 hours. Lipid Profile: No results for input(s): CHOL, HDL, LDLCALC, TRIG, CHOLHDL, LDLDIRECT in the last 72 hours. Thyroid Function Tests: No results for input(s): TSH, T4TOTAL, FREET4,  T3FREE, THYROIDAB in the last 72 hours.  Anemia Panel: No results for input(s): VITAMINB12, FOLATE, FERRITIN, TIBC, IRON, RETICCTPCT in the last 72 hours.    Radiology Studies: I have reviewed all of the imaging during this hospital visit personally     Scheduled Meds:  lactose free nutrition  237 mL Oral TID WC   latanoprost  1 drop Both Eyes QHS   levothyroxine  88 mcg Oral Q0600   multivitamin with minerals  1 tablet Oral Daily   mupirocin ointment  1 application Nasal BID   pantoprazole  40 mg Oral Daily   timolol  1 drop Both Eyes BID   Continuous Infusions:  piperacillin-tazobactam (ZOSYN)  IV 3.375 g (03/29/21 1349)     LOS: 3 days        Alma Friendly, MD

## 2021-03-30 LAB — CBC WITH DIFFERENTIAL/PLATELET
Abs Immature Granulocytes: 0.04 10*3/uL (ref 0.00–0.07)
Basophils Absolute: 0.1 10*3/uL (ref 0.0–0.1)
Basophils Relative: 0 %
Eosinophils Absolute: 0.3 10*3/uL (ref 0.0–0.5)
Eosinophils Relative: 2 %
HCT: 29.4 % — ABNORMAL LOW (ref 36.0–46.0)
Hemoglobin: 9.8 g/dL — ABNORMAL LOW (ref 12.0–15.0)
Immature Granulocytes: 0 %
Lymphocytes Relative: 69 %
Lymphs Abs: 13.4 10*3/uL — ABNORMAL HIGH (ref 0.7–4.0)
MCH: 33.7 pg (ref 26.0–34.0)
MCHC: 33.3 g/dL (ref 30.0–36.0)
MCV: 101 fL — ABNORMAL HIGH (ref 80.0–100.0)
Monocytes Absolute: 1.3 10*3/uL — ABNORMAL HIGH (ref 0.1–1.0)
Monocytes Relative: 7 %
Neutro Abs: 4.2 10*3/uL (ref 1.7–7.7)
Neutrophils Relative %: 22 %
Platelets: 165 10*3/uL (ref 150–400)
RBC: 2.91 MIL/uL — ABNORMAL LOW (ref 3.87–5.11)
RDW: 13.2 % (ref 11.5–15.5)
WBC: 19.2 10*3/uL — ABNORMAL HIGH (ref 4.0–10.5)
nRBC: 0 % (ref 0.0–0.2)

## 2021-03-30 LAB — PROCALCITONIN: Procalcitonin: 0.21 ng/mL

## 2021-03-30 LAB — BASIC METABOLIC PANEL
Anion gap: 5 (ref 5–15)
BUN: 8 mg/dL (ref 8–23)
CO2: 24 mmol/L (ref 22–32)
Calcium: 8.8 mg/dL — ABNORMAL LOW (ref 8.9–10.3)
Chloride: 104 mmol/L (ref 98–111)
Creatinine, Ser: 0.97 mg/dL (ref 0.44–1.00)
GFR, Estimated: 57 mL/min — ABNORMAL LOW (ref >60)
Glucose, Bld: 83 mg/dL (ref 70–99)
Potassium: 3.6 mmol/L (ref 3.5–5.1)
Sodium: 133 mmol/L — ABNORMAL LOW (ref 135–145)

## 2021-03-30 NOTE — Progress Notes (Signed)
PROGRESS NOTE    Rebecca [name]  KVQ:[id number] DOB: March 08, 1933 DOA: 03/25/2021 PCP: [name], MD    Brief Narrative:  87 yo female with the past medical history of HTN, chronic hyponatremia, GERD, and hypothyroidism who presented with abdominal pain, nausea and vomiting. She was diagnosed with biliary colic due gallbladder sludge in November 2022, and referred to surgical evaluation. Her appointment was scheduled for 03/30/21. Because of persistent and worsening symptoms she presented to the hospital for more urgent evaluation. In the ED, VSS, labs notable for alkaline phosphatase 174, AST 356, ALT 294, white count 27.4. CT of the abdomen positive for cholelithiasis, USS showed cholelithiasis without sonographic evidence of acute cholecystitis. MRCP with distended common bile duct with choledocholithiasis. ERCP done on 12/24 with biliary sphincterectomy, balloon dilatation of the sphincterotomy site and the balloon sweeping of the bile duct. Pt subsequently had lap chole on 12/25.     Assessment & Plan:   Principal Problem:   Cholelithiasis Active Problems:   Hypothyroidism   Essential hypertension   GERD   Elevated LFTs   Chronic hyponatremia   Choledocholithiasis   Cholangitis   Malnutrition of moderate degree   Cholelithiasis complicated with choledocholithiasis Possible cholangitis with severe sepsis on presentation Currently afebrile with persistent leukocytosis (worsened by ?recent surgery) S/p ERCP done on 12/24 with biliary sphincterectomy, balloon dilatation of the sphincterotomy site and the balloon sweeping of the bile duct Pt subsequently had lap chole on 12/25 Continue antibiotic therapy for now with Zosyn and d/c after day 5 (pending repeat CBC) Procalcitonin 0.21 Continue IV Zosyn for total of 5 days Monitor closely  Acute on chronic diarrhea  Likely antibiotic related, will monitor closely Continue as needed loperamide for now  Anemia likely  due to recent surgery Monitor closely Daily CBC  Hyponatremia Sodium fluctuating, monitor closely Daily BMP   Hypothyroidism Continue with levothyroxine   HTN BP stable, monitor closely   GERD On pantoprazole     Anxiety PRN alprazolam      Status is: Inpatient  Remains inpatient appropriate because: Level of care  DVT prophylaxis:  Enoxaparin   Code Status:    DNR Family Communication:  Daughter at the bedside      Nutrition Status: Nutrition Problem: Moderate Malnutrition Etiology: acute illness (biliary colic d/t gallbladder sludge) Signs/Symptoms: moderate fat depletion, moderate muscle depletion Interventions: Boost Plus, MVI   Consultants:  GI  Gen surgery   Procedures:  ERCP  Cholecystectomy   Antimicrobials:   Zosyn     Subjective: Today, patient denies any new complaints, diarrhea early this a.m.  Reports some mild postop abdominal discomfort around lap chole incision sites.  Continues to remain afebrile    Objective: Vitals:   03/29/21 0539 03/29/21 1501 03/29/21 2139 03/30/21 0600  BP: (!) 154/64 (!) 157/68 (!) 166/68 (!) 151/59  Pulse: (!) 56 (!) 50 (!) 49 (!) 51  Resp: 14 16 14 14   Temp: 97.9 F (36.6 C) 98.6 F (37 C) 98.6 F (37 C) 98.7 F (37.1 C)  TempSrc: Oral Oral Oral Oral  SpO2: 95% 95% 97% 94%  Weight: 60.5 kg   59.7 kg  Height:       No intake or output data in the 24 hours ending 03/30/21 1308  Filed Weights   03/29/21 0500 03/29/21 0539 03/30/21 0600  Weight: 58.5 kg 60.5 kg 59.7 kg    Examination:  General: NAD  Cardiovascular: S1, S2 present Respiratory: CTAB Abdomen: Soft, nontender, nondistended, bowel sounds present,  incision sites C/D/I Musculoskeletal: No bilateral pedal edema noted Skin: Normal Psychiatry: Normal mood      Data Reviewed: I have personally reviewed following labs and imaging studies  CBC: Recent Labs  Lab 03/25/21 1557 03/26/21 0338 03/27/21 0546 03/28/21 0909  03/29/21 0527 03/30/21 0518  WBC 27.4* 23.0* 10.8* 20.3* 27.7* 19.2*  NEUTROABS 8.3* 7.7  --   --   --  4.2  HGB 12.2 9.8* 9.6* 10.1* 10.5* 9.8*  HCT 36.8 28.3* 29.1* 31.3* 31.1* 29.4*  MCV 100.8* 99.3 102.8* 105.0* 101.3* 101.0*  PLT 261 184 154 174 203 564   Basic Metabolic Panel: Recent Labs  Lab 03/26/21 0338 03/27/21 0546 03/28/21 0909 03/29/21 0527 03/30/21 0518  NA 126* 135 134* 132* 133*  K 4.0 3.3* 4.0 3.8 3.6  CL 99 106 107 104 104  CO2 22 22 21* 23 24  GLUCOSE 128* 127* 128* 89 83  BUN 11 10 8 9 8   CREATININE 0.87 0.86 0.90 1.01* 0.97  CALCIUM 8.9 8.9 9.0 9.0 8.8*  MG  --   --  1.8  --   --    GFR: Estimated Creatinine Clearance: 33.7 mL/min (by C-G formula based on SCr of 0.97 mg/dL). Liver Function Tests: Recent Labs  Lab 03/25/21 1557 03/26/21 0338 03/27/21 0546 03/28/21 0909  AST 356* 169* 69* 42*  ALT 294* 216* 134* 95*  ALKPHOS 174* 124 117 102  BILITOT 2.6* 2.9* 1.0 1.0  PROT 6.8 5.1* 5.2* 5.4*  ALBUMIN 3.9 2.9* 3.0* 3.1*   Recent Labs  Lab 03/25/21 1557  LIPASE 26   No results for input(s): AMMONIA in the last 168 hours. Coagulation Profile: Recent Labs  Lab 03/26/21 1111  INR 1.3*   Cardiac Enzymes: No results for input(s): CKTOTAL, CKMB, CKMBINDEX, TROPONINI in the last 168 hours. BNP (last 3 results) No results for input(s): PROBNP in the last 8760 hours. HbA1C: No results for input(s): HGBA1C in the last 72 hours. CBG: No results for input(s): GLUCAP in the last 168 hours. Lipid Profile: No results for input(s): CHOL, HDL, LDLCALC, TRIG, CHOLHDL, LDLDIRECT in the last 72 hours. Thyroid Function Tests: No results for input(s): TSH, T4TOTAL, FREET4, T3FREE, THYROIDAB in the last 72 hours.  Anemia Panel: No results for input(s): VITAMINB12, FOLATE, FERRITIN, TIBC, IRON, RETICCTPCT in the last 72 hours.    Radiology Studies: I have reviewed all of the imaging during this hospital visit personally     Scheduled Meds:   enoxaparin (LOVENOX) injection  40 mg Subcutaneous Q24H   lactose free nutrition  237 mL Oral TID WC   latanoprost  1 drop Both Eyes QHS   levothyroxine  88 mcg Oral Q0600   losartan  25 mg Oral Daily   multivitamin with minerals  1 tablet Oral Daily   mupirocin ointment  1 application Nasal BID   pantoprazole  40 mg Oral Daily   timolol  1 drop Both Eyes BID   Continuous Infusions:  piperacillin-tazobactam (ZOSYN)  IV 3.375 g (03/30/21 0534)     LOS: 4 days        Alma Friendly, MD

## 2021-03-31 ENCOUNTER — Encounter (HOSPITAL_COMMUNITY): Payer: Self-pay | Admitting: Gastroenterology

## 2021-03-31 ENCOUNTER — Encounter: Payer: Medicare Other | Admitting: Family Medicine

## 2021-03-31 DIAGNOSIS — K219 Gastro-esophageal reflux disease without esophagitis: Secondary | ICD-10-CM

## 2021-03-31 DIAGNOSIS — F419 Anxiety disorder, unspecified: Secondary | ICD-10-CM

## 2021-03-31 DIAGNOSIS — D649 Anemia, unspecified: Secondary | ICD-10-CM

## 2021-03-31 DIAGNOSIS — E440 Moderate protein-calorie malnutrition: Secondary | ICD-10-CM

## 2021-03-31 LAB — HEPATIC FUNCTION PANEL
ALT: 47 U/L — ABNORMAL HIGH (ref 0–44)
AST: 26 U/L (ref 15–41)
Albumin: 2.9 g/dL — ABNORMAL LOW (ref 3.5–5.0)
Alkaline Phosphatase: 82 U/L (ref 38–126)
Bilirubin, Direct: 0.3 mg/dL — ABNORMAL HIGH (ref 0.0–0.2)
Indirect Bilirubin: 0.7 mg/dL (ref 0.3–0.9)
Total Bilirubin: 1 mg/dL (ref 0.3–1.2)
Total Protein: 5.3 g/dL — ABNORMAL LOW (ref 6.5–8.1)

## 2021-03-31 LAB — BASIC METABOLIC PANEL
Anion gap: 7 (ref 5–15)
BUN: 6 mg/dL — ABNORMAL LOW (ref 8–23)
CO2: 24 mmol/L (ref 22–32)
Calcium: 9.1 mg/dL (ref 8.9–10.3)
Chloride: 99 mmol/L (ref 98–111)
Creatinine, Ser: 0.89 mg/dL (ref 0.44–1.00)
GFR, Estimated: >60 mL/min (ref >60)
Glucose, Bld: 90 mg/dL (ref 70–99)
Potassium: 3.5 mmol/L (ref 3.5–5.1)
Sodium: 130 mmol/L — ABNORMAL LOW (ref 135–145)

## 2021-03-31 LAB — CBC WITH DIFFERENTIAL/PLATELET
Abs Immature Granulocytes: 0.06 10*3/uL (ref 0.00–0.07)
Basophils Absolute: 0.1 10*3/uL (ref 0.0–0.1)
Basophils Relative: 0 %
Eosinophils Absolute: 0.3 10*3/uL (ref 0.0–0.5)
Eosinophils Relative: 1 %
HCT: 32.4 % — ABNORMAL LOW (ref 36.0–46.0)
Hemoglobin: 10.9 g/dL — ABNORMAL LOW (ref 12.0–15.0)
Immature Granulocytes: 0 %
Lymphocytes Relative: 77 %
Lymphs Abs: 19.4 10*3/uL — ABNORMAL HIGH (ref 0.7–4.0)
MCH: 34.2 pg — ABNORMAL HIGH (ref 26.0–34.0)
MCHC: 33.6 g/dL (ref 30.0–36.0)
MCV: 101.6 fL — ABNORMAL HIGH (ref 80.0–100.0)
Monocytes Absolute: 1.8 10*3/uL — ABNORMAL HIGH (ref 0.1–1.0)
Monocytes Relative: 7 %
Neutro Abs: 3.9 10*3/uL (ref 1.7–7.7)
Neutrophils Relative %: 15 %
Platelets: 185 10*3/uL (ref 150–400)
RBC: 3.19 MIL/uL — ABNORMAL LOW (ref 3.87–5.11)
RDW: 13.1 % (ref 11.5–15.5)
WBC: 25.6 10*3/uL — ABNORMAL HIGH (ref 4.0–10.5)
nRBC: 0 % (ref 0.0–0.2)

## 2021-03-31 LAB — LIPASE, BLOOD: Lipase: 23 U/L (ref 11–51)

## 2021-03-31 LAB — PROCALCITONIN: Procalcitonin: 0.12 ng/mL

## 2021-03-31 NOTE — Progress Notes (Signed)
PROGRESS NOTE    Rebecca [name]  DPO:[id number] DOB: 1933-03-08 DOA: 03/25/2021 PCP: [name], MD   Chief Complaint  Patient presents with   Abdominal Pain   Nausea  Brief Narrative/Hospital Course: Rebecca [name], 87 y.o. female with PMH of hypertension, chronic hyponatremia, GERD, hypothyroidism found to have gallbladder sludge, biliary colic November 4315, referred to surgical evaluation but due to persistent and worsening symptoms presented to the ED where he found to have elevated LFTs, significant leukocytosis 27.4, CT abdomen pelvis with cholelithiasis, ultrasound showed cholelithiasis without sonographic evidence of acute cholecystitis, MRCP with distended common bile duct with choledocholithiasis underwent ERCP on 12/24 with biliary sphincterotomy, balloon dilatation, sweeping of the bile duct and subsequently underwent laparoscopic cholecystectomy on 12/25. Seen by surgery, diet was slowly advanced and tolerated well. She has had persistent leukocytosis, although afebrile.  She has also been having diarrhea   Subjective: Seen and examined this morning.  She is alert awake oriented, denies any nausea vomiting abdominal pain dysuria fever chills cough Overnight afebrile, WBC however trending up-although CT seems to be clinically improving, daughter on the phone asking about regular diet.  Assessment & Plan:  Cholelithiasis Choledocholithiasis Severe sepsis POA with concern for cholangitis: Status post ERCP 12/24, status post lap cholecystectomy 12/25.  On Zosyn since 12/23-day 5 today and will discontinue, LFTs and lipase remained stable. Recent Labs  Lab 03/25/21 1557 03/26/21 0338 03/26/21 1111 03/27/21 0546 03/28/21 0909 03/31/21 0539  AST 356* 169*  --  69* 42* 26  ALT 294* 216*  --  134* 95* 47*  ALKPHOS 174* 124  --  117 102 82  BILITOT 2.6* 2.9*  --  1.0 1.0 1.0  PROT 6.8 5.1*  --  5.2* 5.4* 5.3*  ALBUMIN 3.9 2.9*  --  3.0* 3.1* 2.9*  INR  --    --  1.3*  --   --   --     Persistent leukocytosis: Afebrile with downtrending procalcitonin.  On review she did have leukocytosis up to 16.8 on 11/9, and a couple of years ago and level has been fluctuating since.  Suspect reactive, as patient appears to be clinically improving, will hold off on further antibiotics and monitor without antibiotics, CBC in a.m. Recent Labs  Lab 03/27/21 0546 03/28/21 0909 03/29/21 0527 03/30/21 0518 03/31/21 0539  WBC 10.8* 20.3* 27.7* 19.2* 25.6*  PROCALCITON  --   --   --  0.21 0.12     Anemia likely due to recent surgery/chronic disease: Hemoglobin is stable.  Monitor  Acute On chronic diarrhea: Reports tarry stool but soft this morning.  Hypothyroidism: Continue her home Synthroid  Essential hypertension: Blood pressures controlled, continue her losartan.  GERD: Continue PPI  Chronic hyponatremia: Sodium at 130 stable.  Monitor  Anxiety disorder-mood is stable.  Malnutrition of moderate degree: Patient has the signs/symptoms consistent with  MODERATE PCM  Plan: Dietitian consult and augment nutrition/add suupplement to improve caloric intake.   DVT prophylaxis: enoxaparin (LOVENOX) injection 40 mg Start: 03/29/21 1800 SCDs Start: 03/26/21 0120 Code Status:   Code Status: DNR Family Communication: plan of care discussed with patient and daughter on the phone Status is: Inpatient Remains inpatient appropriate because: For postop management for persistent leukocytosis Disposition: Currently not medically stable for discharge. Anticipated Disposition: HH is already set up  Objective: Vitals last 24 hrs: Vitals:   03/30/21 2030 03/31/21 0500 03/31/21 0509 03/31/21 0623  BP: (!) 168/78  (!) 174/74 (!) 172/69  Pulse: (!) 58  [name] Kitchen)  59 (!) 55  Resp: 19  18   Temp: 98.3 F (36.8 C)  98.8 F (37.1 C)   TempSrc: Oral  Oral   SpO2: 95%  94%   Weight:  60 kg    Height:       Weight change: 0.3 kg  Intake/Output Summary (Last 24 hours) at  03/31/2021 1055 Last data filed at 03/31/2021 0543 Gross per 24 hour  Intake 200 ml  Output --  Net 200 ml   Net IO Since Admission: 2,867.81 mL [03/31/21 1055]   Physical Examination: General exam: Aa0x3, pleasant, weak,older than stated age. HEENT:Oral mucosa moist, Ear/Nose WNL grossly,dentition normal. Respiratory system: B/l clear BS, no use of accessory muscle, non tender. Cardiovascular system: S1 & S2 +,No JVD. Gastrointestinal system: Abdomen soft, NT,ND, BS+. Nervous System:Alert, awake, moving extremities. Extremities: edema none, distal peripheral pulses palpable.  Skin: No rashes, no icterus. MSK: Normal muscle bulk, tone, power.  Medications reviewed:  Scheduled Meds:  enoxaparin (LOVENOX) injection  40 mg Subcutaneous Q24H   lactose free nutrition  237 mL Oral TID WC   latanoprost  1 drop Both Eyes QHS   levothyroxine  88 mcg Oral Q0600   losartan  25 mg Oral Daily   multivitamin with minerals  1 tablet Oral Daily   pantoprazole  40 mg Oral Daily   timolol  1 drop Both Eyes BID   Continuous Infusions:     Diet Order             Diet regular Room service appropriate? Yes; Fluid consistency: Thin  Diet effective now                   Nutrition Problem: Moderate Malnutrition Etiology: acute illness (biliary colic d/t gallbladder sludge) Signs/Symptoms: moderate fat depletion, moderate muscle depletion Interventions: Boost Plus, MVI  Weight change: 0.3 kg  Wt Readings from Last 3 Encounters:  03/31/21 60 kg  02/17/21 53.3 kg  02/10/21 54.8 kg     Consultants:see note  Procedures:see note Antimicrobials: Anti-infectives (From admission, onward)    Start     Dose/Rate Route Frequency Ordered Stop   03/28/21 0706  piperacillin-tazobactam (ZOSYN) 3.375 GM/50ML IVPB       Note to Pharmacy: Margurite Auerbach: cabinet override      03/28/21 0706 03/28/21 0744   03/26/21 2200  piperacillin-tazobactam (ZOSYN) IVPB 3.375 g  Status:  Discontinued         3.375 g 12.5 mL/hr over 240 Minutes Intravenous Every 8 hours 03/26/21 1526 03/31/21 0958   03/26/21 0230  piperacillin-tazobactam (ZOSYN) IVPB 3.375 g  Status:  Discontinued        3.375 g 12.5 mL/hr over 240 Minutes Intravenous Every 8 hours 03/26/21 0137 03/26/21 1526      Culture/Microbiology    Component Value Date/Time   SDES  03/03/2018 1200    URINE, CLEAN CATCH Performed at [facility], [place] [address] [place], [place] [address]    [place]  03/03/2018 1200    NONE Performed at [facility], [place] [address] [place], [place] [address]    CULT  03/03/2018 1200    NO GROWTH Performed at [facility] [address] [place], [place] [address]    REPTSTATUS 03/04/2018 FINAL 03/03/2018 1200    Other culture-see note  Unresulted Labs (From admission, onward)     Start     Ordered   03/31/21 0500  Procalcitonin  Daily,   R  Question:  Specimen collection method  Answer:  Lab=Lab collect   03/29/21 1448   03/30/21 0500  CBC with Differential/Platelet  Daily,   R     Question:  Specimen collection method  Answer:  Lab=Lab collect   03/29/21 1449   03/30/21 1093  Basic metabolic panel  Daily,   R     Question:  Specimen collection method  Answer:  Lab=Lab collect   03/29/21 1449   Signed and Held  Comprehensive metabolic panel  Once,   R        Signed and Held   Signed and Held  Lipase, blood  Tomorrow morning,   R        Signed and Held          Data Reviewed: I have personally reviewed following labs and imaging studies CBC: Recent Labs  Lab 03/25/21 1557 03/26/21 0338 03/27/21 0546 03/28/21 0909 03/29/21 0527 03/30/21 0518 03/31/21 0539  WBC 27.4* 23.0* 10.8* 20.3* 27.7* 19.2* 25.6*  NEUTROABS 8.3* 7.7  --   --   --  4.2 3.9  HGB 12.2 9.8* 9.6* 10.1* 10.5* 9.8* 10.9*  HCT 36.8 28.3* 29.1* 31.3* 31.1* 29.4* 32.4*  MCV 100.8* 99.3 102.8* 105.0* 101.3* 101.0* 101.6*  PLT 261 184 154 174 203 165 235    Basic Metabolic Panel: Recent Labs  Lab 03/27/21 0546 03/28/21 0909 03/29/21 0527 03/30/21 0518 03/31/21 0539  NA 135 134* 132* 133* 130*  K 3.3* 4.0 3.8 3.6 3.5  CL 106 107 104 104 99  CO2 22 21* 23 24 24   GLUCOSE 127* 128* 89 83 90  BUN 10 8 9 8  6*  CREATININE 0.86 0.90 1.01* 0.97 0.89  CALCIUM 8.9 9.0 9.0 8.8* 9.1  MG  --  1.8  --   --   --    GFR: Estimated Creatinine Clearance: 36.7 mL/min (by C-G formula based on SCr of 0.89 mg/dL). Liver Function Tests: Recent Labs  Lab 03/25/21 1557 03/26/21 0338 03/27/21 0546 03/28/21 0909 03/31/21 0539  AST 356* 169* 69* 42* 26  ALT 294* 216* 134* 95* 47*  ALKPHOS 174* 124 117 102 82  BILITOT 2.6* 2.9* 1.0 1.0 1.0  PROT 6.8 5.1* 5.2* 5.4* 5.3*  ALBUMIN 3.9 2.9* 3.0* 3.1* 2.9*   Recent Labs  Lab 03/25/21 1557 03/31/21 0539  LIPASE 26 23   No results for input(s): AMMONIA in the last 168 hours. Coagulation Profile: Recent Labs  Lab 03/26/21 1111  INR 1.3*   Cardiac Enzymes: No results for input(s): CKTOTAL, CKMB, CKMBINDEX, TROPONINI in the last 168 hours. BNP (last 3 results) No results for input(s): PROBNP in the last 8760 hours. HbA1C: No results for input(s): HGBA1C in the last 72 hours. CBG: No results for input(s): GLUCAP in the last 168 hours. Lipid Profile: No results for input(s): CHOL, HDL, LDLCALC, TRIG, CHOLHDL, LDLDIRECT in the last 72 hours. Thyroid Function Tests: No results for input(s): TSH, T4TOTAL, FREET4, T3FREE, THYROIDAB in the last 72 hours. Anemia Panel: No results for input(s): VITAMINB12, FOLATE, FERRITIN, TIBC, IRON, RETICCTPCT in the last 72 hours. Sepsis Labs: Recent Labs  Lab 03/30/21 0518 03/31/21 0539  PROCALCITON 0.21 0.12    Recent Results (from the past 240 hour(s))  Resp Panel by RT-PCR (Flu A&B, Covid) Nasopharyngeal Swab     Status: None   Collection Time: 03/25/21  8:26 PM   Specimen: Nasopharyngeal Swab; Nasopharyngeal(NP) swabs in vial transport medium   Result Value Ref Range Status   SARS  Coronavirus 2 by RT PCR NEGATIVE NEGATIVE Final    Comment: (NOTE) SARS-CoV-2 target nucleic acids are NOT DETECTED.  The SARS-CoV-2 RNA is generally detectable in upper respiratory specimens during the acute phase of infection. The lowest concentration of SARS-CoV-2 viral copies this assay can detect is 138 copies/mL. A negative result does not preclude SARS-Cov-2 infection and should not be used as the sole basis for treatment or other patient management decisions. A negative result may occur with  improper specimen collection/handling, submission of specimen other than nasopharyngeal swab, presence of viral mutation(s) within the areas targeted by this assay, and inadequate number of viral copies(<138 copies/mL). A negative result must be combined with clinical observations, patient history, and epidemiological information. The expected result is Negative.  Fact Sheet for Patients:  EntrepreneurPulse.com.au  Fact Sheet for Healthcare Providers:  IncredibleEmployment.be  This test is no t yet approved or cleared by the [place] FDA and  has been authorized for detection and/or diagnosis of SARS-CoV-2 by FDA under an Emergency Use Authorization (EUA). This EUA will remain  in effect (meaning this test can be used) for the duration of the COVID-19 declaration under Section 564(b)(1) of the Act, 21 U.S.C.section 360bbb-3(b)(1), unless the authorization is terminated  or revoked sooner.       Influenza A by PCR NEGATIVE NEGATIVE Final   Influenza B by PCR NEGATIVE NEGATIVE Final    Comment: (NOTE) The Xpert Xpress SARS-CoV-2/FLU/RSV plus assay is intended as an aid in the diagnosis of influenza from Nasopharyngeal swab specimens and should not be used as a sole basis for treatment. Nasal washings and aspirates are unacceptable for Xpert Xpress SARS-CoV-2/FLU/RSV testing.  Fact Sheet for  Patients: EntrepreneurPulse.com.au  Fact Sheet for Healthcare Providers: IncredibleEmployment.be  This test is not yet approved or cleared by the [place] FDA and has been authorized for detection and/or diagnosis of SARS-CoV-2 by FDA under an Emergency Use Authorization (EUA). This EUA will remain in effect (meaning this test can be used) for the duration of the COVID-19 declaration under Section 564(b)(1) of the Act, 21 U.S.C. section 360bbb-3(b)(1), unless the authorization is terminated or revoked.  Performed at [facility], [place] [address], [place], [place] [address]   Surgical PCR screen     Status: Abnormal   Collection Time: 03/26/21  2:01 AM   Specimen: Nasal Mucosa; Nasal Swab  Result Value Ref Range Status   MRSA, PCR NEGATIVE NEGATIVE Final   Staphylococcus aureus POSITIVE (A) NEGATIVE Final    Comment: (NOTE) The Xpert SA Assay (FDA approved for NASAL specimens in patients 76 years of age and older), is one component of a comprehensive surveillance program. It is not intended to diagnose infection nor to guide or monitor treatment. Performed at [facility], [place] [address] [place], [place] [address]    Radiology Studies: No results found.   LOS: 5 days   Antonieta Pert, MD [facility]  03/31/2021, 10:55 AM

## 2021-03-31 NOTE — Progress Notes (Signed)
°   03/31/21 0509  Vitals  Temp 98.8 F (37.1 C)  Temp Source Oral  BP (!) 174/74  MAP (mmHg) 97  BP Method Automatic  Pulse Rate (!) 59  Pulse Rate Source Monitor  Resp 18  Level of Consciousness  Level of Consciousness Alert  MEWS COLOR  MEWS Score Color Green  Oxygen Therapy  SpO2 94 %  Pain Assessment  Pain Scale 0-10  Pain Score 0  MEWS Score  MEWS Temp 0  MEWS Systolic 0  MEWS Pulse 0  MEWS RR 0  MEWS LOC 0  MEWS Score 0  Provider Notification  Provider Name/Title Clarene Essex, NP  Date Provider Notified 03/31/21  Time Provider Notified [phone]  Notification Type Page  Notification Reason Other (Comment) (BP 174/74, pt asymptomatic)  Provider response  (will consider increasing dose of Losartan, RN to notify if SBP is 180 or greater.)  Date of Provider Response 03/31/21  Time of Provider Response 0515   No distress noted. Will continue to monitor pt closely. Neomia Dear, RN

## 2021-03-31 NOTE — Progress Notes (Signed)
Pt with complaints of discomfort from diarrhea this morning, concerned about the amount of diarrhea and plans to speak with provider regarding an antibiotic change. States it is too difficult to manage. Denied any other concerns at this time. Rebecca [name]

## 2021-03-31 NOTE — Care Management Important Message (Signed)
Important Message  Patient Details IM Letter given to the Patient. Name: Rebecca [name] MRN: [id number] Date of Birth: May 08, 1932   Medicare Important Message Given:  Yes     Kerin Salen 03/31/2021, 10:49 AM

## 2021-04-01 LAB — CBC WITH DIFFERENTIAL/PLATELET
Abs Immature Granulocytes: 0.06 K/uL (ref 0.00–0.07)
Basophils Absolute: 0.1 K/uL (ref 0.0–0.1)
Basophils Relative: 0 %
Eosinophils Absolute: 0.2 K/uL (ref 0.0–0.5)
Eosinophils Relative: 1 %
HCT: 29.4 % — ABNORMAL LOW (ref 36.0–46.0)
Hemoglobin: 9.9 g/dL — ABNORMAL LOW (ref 12.0–15.0)
Immature Granulocytes: 0 %
Lymphocytes Relative: 77 %
Lymphs Abs: 16.4 K/uL — ABNORMAL HIGH (ref 0.7–4.0)
MCH: 33.7 pg (ref 26.0–34.0)
MCHC: 33.7 g/dL (ref 30.0–36.0)
MCV: 100 fL (ref 80.0–100.0)
Monocytes Absolute: 1.6 K/uL — ABNORMAL HIGH (ref 0.1–1.0)
Monocytes Relative: 7 %
Neutro Abs: 3.4 K/uL (ref 1.7–7.7)
Neutrophils Relative %: 15 %
Platelets: 184 K/uL (ref 150–400)
RBC: 2.94 MIL/uL — ABNORMAL LOW (ref 3.87–5.11)
RDW: 13.2 % (ref 11.5–15.5)
WBC: 21.7 K/uL — ABNORMAL HIGH (ref 4.0–10.5)
nRBC: 0 % (ref 0.0–0.2)

## 2021-04-01 LAB — BASIC METABOLIC PANEL WITH GFR
Anion gap: 5 (ref 5–15)
BUN: <5 mg/dL — ABNORMAL LOW (ref 8–23)
CO2: 26 mmol/L (ref 22–32)
Calcium: 8.7 mg/dL — ABNORMAL LOW (ref 8.9–10.3)
Chloride: 101 mmol/L (ref 98–111)
Creatinine, Ser: 0.83 mg/dL (ref 0.44–1.00)
GFR, Estimated: >60 mL/min
Glucose, Bld: 89 mg/dL (ref 70–99)
Potassium: 3.3 mmol/L — ABNORMAL LOW (ref 3.5–5.1)
Sodium: 132 mmol/L — ABNORMAL LOW (ref 135–145)

## 2021-04-01 LAB — SURGICAL PATHOLOGY

## 2021-04-01 LAB — PROCALCITONIN: Procalcitonin: <0.1 ng/mL

## 2021-04-01 MED ORDER — BOOST / RESOURCE BREEZE PO LIQD CUSTOM: 1.0000 | Freq: Two times a day (BID) | ORAL | Status: DC

## 2021-04-01 MED ORDER — BOOST PLUS PO LIQD: 237.0000 mL | ORAL | Status: DC

## 2021-04-01 MED ORDER — LOPERAMIDE HCL 2 MG PO CAPS: 4.0000 mg | ORAL_CAPSULE | Freq: Four times a day (QID) | ORAL | 0 refills | Status: DC | PRN

## 2021-04-01 MED ORDER — PHENYLEPHRINE HCL-NACL 20-0.9 MG/250ML-% IV SOLN
INTRAVENOUS | Status: AC
  Filled 2021-04-01: qty 250

## 2021-04-01 NOTE — Progress Notes (Signed)
Occupational Therapy Treatment Patient Details Name: Rebecca [name] MRN: [id number] DOB: 09-Sep-1932 Today's Date: 04/01/2021   History of present illness 87 yo female with the past medical history of HTN, chronic hyponatremia, GERD, and hypothyroidism who presented 03/25/21  with worsening abdominal pain, nausea and vomiting. She was diagnosed with biliary colic due gallbladder sludge in November 2022, and referred to surgical evaluation.   OT comments  Patient is modified independent for ambulation and ADLs. OT goals met and education provided to patient and daughter in regards to home safety. No further OT needs at this time.    Recommendations for follow up therapy are one component of a multi-disciplinary discharge planning process, led by the attending physician.  Recommendations may be updated based on patient status, additional functional criteria and insurance authorization.    Follow Up Recommendations  No OT follow up    Assistance Recommended at Discharge PRN  Equipment Recommendations  Tub/shower seat    Recommendations for Other Services      Precautions / Restrictions Precautions Precautions: None Restrictions Weight Bearing Restrictions: No          Balance Overall balance assessment: Mild deficits observed, not formally tested                                         ADL either performed or assessed with clinical judgement   ADL Overall ADL's : Modified independent                     Lower Body Dressing: Independent Lower Body Dressing Details (indicate cue type and reason): able to don socks Toilet Transfer: Independent   Toileting- Clothing Manipulation and Hygiene: Independent       Functional mobility during ADLs: Modified independent General ADL Comments: Patient demonstrates ability to ambulate safely in room with RW. Patient demonstrates ability to don socks. Reports she has been independently going to the bathroom.  Demonstrates ability to take hands off of walker to pull up clothing. Educated patient and daughter on home environement safety - recmmended use of a shower chair, removal of throw rugs. Therapisst also lowered walker to mroe suitable height.     Vision Patient Visual Report: No change from baseline            Cognition Arousal/Alertness: Awake/alert Behavior During Therapy: WFL for tasks assessed/performed Overall Cognitive Status: Within Functional Limits for tasks assessed                                                       Pertinent Vitals/ Pain       Pain Assessment: No/denies pain   Progress Toward Goals  OT Goals(current goals can now be found in the care plan section)  Progress towards OT goals: Goals met/education completed, patient discharged from OT  Acute Rehab OT Goals OT Goal Formulation: All assessment and education complete, DC therapy  Plan All goals met and education completed, patient discharged from OT services    Co-evaluation          OT goals addressed during session: ADL's and self-care      AM-PAC OT "6 Clicks" Daily Activity     Outcome Measure   Help from another person eating  meals?: None Help from another person taking care of personal grooming?: None Help from another person toileting, which includes using toliet, bedpan, or urinal?: None Help from another person bathing (including washing, rinsing, drying)?: None Help from another person to put on and taking off regular upper body clothing?: None Help from another person to put on and taking off regular lower body clothing?: None 6 Click Score: 24    End of Session Equipment Utilized During Treatment: Gait belt;Rolling walker (2 wheels)  OT Visit Diagnosis: Unsteadiness on feet (R26.81);Muscle weakness (generalized) (M62.81)   Activity Tolerance Patient tolerated treatment well   Patient Left in chair;with call bell/phone within reach;with family/visitor  present   Nurse Communication Mobility status        Time: 1224-4975 OT Time Calculation (min): 9 min  Charges: OT General Charges $OT Visit: 1 Visit OT Treatments $Self Care/Home Management : 8-22 mins  Derl Barrow, OTR/L [place]  Office [phone] Pager: [place] 04/01/2021, 10:48 AM

## 2021-04-01 NOTE — Discharge Summary (Signed)
Physician Discharge Summary  Rebecca [name] JJO:[id number] DOB: Aug 05, 1932 DOA: 03/25/2021  PCP: [name], MD  Admit date: 03/25/2021 Discharge date: 04/01/2021  Admitted From: home Disposition:  [facility]  Recommendations for Outpatient Follow-up:  Follow up with PCP in 1-2 weeks Please obtain BMP/CBC in one week  Home Health:YES  Equipment/Devices: YES  Discharge Condition: Stable Code Status:   Code Status: DNR Diet recommendation:  Diet Order             Diet regular Room service appropriate? Yes; Fluid consistency: Thin  Diet effective now                    Brief/Interim Summary:  87 y.o. female with PMH of hypertension, chronic hyponatremia, GERD, hypothyroidism found to have gallbladder sludge, biliary colic November 1601, referred to surgical evaluation but due to persistent and worsening symptoms presented to the ED where he found to have elevated LFTs, significant leukocytosis 27.4, CT abdomen pelvis with cholelithiasis, ultrasound showed cholelithiasis without sonographic evidence of acute cholecystitis, MRCP with distended common bile duct with choledocholithiasis underwent ERCP on 12/24 with biliary sphincterotomy, balloon dilatation, sweeping of the bile duct and subsequently underwent laparoscopic cholecystectomy on 12/25. Seen by surgery, diet was slowly advanced and tolerated well. She has had persistent leukocytosis, although afebrile. Antibiotic has been discontinued 12/28, leukocytosis now downtrending, patient denies any nausea vomiting abdominal pain tolerating diet.  She reports her stools are getting more formed.  Discharge Diagnoses:   Cholelithiasis Choledocholithiasis Severe sepsis POA with concern for cholangitis: Status post ERCP 12/24, status post lap cholecystectomy 12/25.  On Zosyn since 12/23-antibiotics completed 12/28.LFTs and lipase remained stable.  Tolerating diet, she will follow-up with surgery as outpatient.  Persistent  leukocytosis: Afebrile with downtrending procalcitonin.  On review she did have leukocytosis up to 16.8 on 11/9, and a couple of years ago and level has been fluctuating since.  Suspect reactive, as patient appears to be clinically improving, off antibiotics WC count now downtrending.  Discussed patient with patient and daughter to check CBC early next week with PCP if further persistent or elevated will need to look up for other etiology. repeated procalcitonin  0.21> 0.12.> today at < 0.1-so less likely infectious etiology.    Anemia likely due to recent surgery/chronic disease: Hemoglobin is stable.  Monitor   Acute On chronic diarrhea: Reports still getting more formed, continue Imodium probiotic.  No abdomen pain or discomfort.  C diff ordered if diarrhea again- but none today.   Hypothyroidism: Continue her home Synthroid   Essential hypertension: Blood pressures controlled, continue her losartan.   GERD: Continue PPI   Chronic hyponatremia: Sodium at 130 stable.  Monitor   Anxiety disorder-mood is stable.   Malnutrition of moderate degree: Patient has the signs/symptoms consistent with  MODERATE PCM  Plan: Dietitian consult and augment nutrition/add suupplement to improve caloric intake.  Consults: surgery  Subjective: Alert awake oriented x3 no nausea pain. Like to go home today. Daughter at the bedside.  Discharge Exam: Vitals:   03/31/21 1900 04/01/21 0526  BP: (!) 160/68 (!) 142/69  Pulse: (!) 55 (!) 57  Resp: 19 20  Temp: 98.7 F (37.1 C) 98.4 F (36.9 C)  SpO2: 95% 95%   General: Pt is alert, awake, not in acute distress Cardiovascular: RRR, S1/S2 +, no rubs, no gallops Respiratory: CTA bilaterally, no wheezing, no rhonchi Abdominal: Soft, NT, ND, bowel sounds + Extremities: no edema, no cyanosis  Discharge Instructions  Discharge Instructions  Discharge instructions   Complete by: As directed    Please call call MD or return to ER for similar or  worsening recurring problem that brought you to hospital or if any fever,nausea/vomiting,abdominal pain, uncontrolled pain, chest pain,  shortness of breath or any other alarming symptoms.  Please follow-up your doctor early next week and check your CBC  Please avoid alcohol, smoking, or any other illicit substance and maintain healthy habits including taking your regular medications as prescribed.  You were cared for by a hospitalist during your hospital stay. If you have any questions about your discharge medications or the care you received while you were in the hospital after you are discharged, you can call the unit and ask to speak with the hospitalist on call if the hospitalist that took care of you is not available.  Once you are discharged, your primary care physician will handle any further medical issues. Please note that NO REFILLS for any discharge medications will be authorized once you are discharged, as it is imperative that you return to your primary care physician (or establish a relationship with a primary care physician if you do not have one) for your aftercare needs so that they can reassess your need for medications and monitor your lab values   Increase activity slowly   Complete by: As directed       Allergies as of 04/01/2021       Reactions   Levaquin [levofloxacin] Other (See Comments)   Very nauseous and shakey   Sulfamethoxazole-trimethoprim Nausea Only   shakey   Lactose Intolerance (gi) Diarrhea, Other (See Comments)   Stomach hurts    Tramadol Hcl Nausea Only, Palpitations   Augmentin [amoxicillin-pot Clavulanate] Diarrhea   Ciprofloxacin Nausea Only   REACTION: Thrush, Nausea, Shakey   Pneumococcal Vaccine Polyvalent    REACTION: RED AND RAISED RASH ON ARM   Adhesive [tape] Rash   Codeine Nausea Only        Medication List     TAKE these medications    ALPRAZolam 0.25 MG tablet Commonly known as: XANAX TAKE 1 TABLET(0.25 MG) BY MOUTH AT  BEDTIME AS NEEDED What changed: See the new instructions.   Calcium-Vitamin D 600-400 MG-UNIT Tabs Take 1 tablet by mouth daily.   diclofenac sodium 1 % Gel Commonly known as: VOLTAREN Apply 2 g topically 4 (four) times daily.   esomeprazole 20 MG capsule Commonly known as: NEXIUM Take 20 mg by mouth 2 (two) times daily before a meal.   latanoprost 0.005 % ophthalmic solution Commonly known as: XALATAN Place 1 drop into both eyes at bedtime.   levothyroxine 88 MCG tablet Commonly known as: SYNTHROID TAKE 1 TABLET(88 MCG) BY MOUTH DAILY What changed: See the new instructions.   loperamide 2 MG capsule Commonly known as: IMODIUM Take 2 capsules (4 mg total) by mouth every 6 (six) hours as needed for diarrhea or loose stools.   losartan 100 MG tablet Commonly known as: COZAAR TAKE 1 TABLET BY MOUTH EVERY DAY   metoCLOPramide 5 MG tablet Commonly known as: REGLAN TAKE 1 TABLET BY MOUTH TWICE DAILY WITH MEALS   ondansetron 4 MG disintegrating tablet Commonly known as: ZOFRAN-ODT DISSOLVE 1 TABLET(4 MG) ON THE TONGUE EVERY 8 HOURS AS NEEDED FOR NAUSEA OR VOMITING What changed: See the new instructions.   rosuvastatin 20 MG tablet Commonly known as: CRESTOR TAKE 1 TABLET(20 MG) BY MOUTH DAILY What changed: See the new instructions.   timolol 0.5 % ophthalmic solution Commonly  known as: TIMOPTIC Place 1 drop into both eyes 2 (two) times daily.   vitamin B-12 500 MCG tablet Commonly known as: CYANOCOBALAMIN Take 1,000 mcg by mouth every morning.   vitamin C 500 MG tablet Commonly known as: ASCORBIC ACID Take 500 mg by mouth daily.   Vitamin D3 50 MCG (2000 UT) Tabs Take 1 tablet by mouth daily.               Durable Medical Equipment  (From admission, onward)           Start     Ordered   03/29/21 1429  For home use only DME Walker rolling  Once       Question Answer Comment  Walker: With 5 Inch Wheels   Patient needs a walker to treat with the  following condition Weakness      03/29/21 1428            Follow-up Information     [facility] Follow up.   Specialty: Home Health Services Contact information: [address] [place] [address] [phone]         [name], MD Follow up in 1 week(s).   Specialty: Family Medicine Contact information: [place] [address] [phone]                Allergies  Allergen Reactions   Levaquin [Levofloxacin] Other (See Comments)    Very nauseous and shakey   Sulfamethoxazole-Trimethoprim Nausea Only    shakey   Lactose Intolerance (Gi) Diarrhea and Other (See Comments)    Stomach hurts    Tramadol Hcl Nausea Only and Palpitations   Augmentin [Amoxicillin-Pot Clavulanate] Diarrhea   Ciprofloxacin Nausea Only    REACTION: Thrush, Nausea, Shakey   Pneumococcal Vaccine Polyvalent     REACTION: RED AND RAISED RASH ON ARM   Adhesive [Tape] Rash   Codeine Nausea Only    The results of significant diagnostics from this hospitalization (including imaging, microbiology, ancillary and laboratory) are listed below for reference.    Microbiology: Recent Results (from the past 240 hour(s))  Resp Panel by RT-PCR (Flu A&B, Covid) Nasopharyngeal Swab     Status: None   Collection Time: 03/25/21  8:26 PM   Specimen: Nasopharyngeal Swab; Nasopharyngeal(NP) swabs in vial transport medium  Result Value Ref Range Status   SARS Coronavirus 2 by RT PCR NEGATIVE NEGATIVE Final    Comment: (NOTE) SARS-CoV-2 target nucleic acids are NOT DETECTED.  The SARS-CoV-2 RNA is generally detectable in upper respiratory specimens during the acute phase of infection. The lowest concentration of SARS-CoV-2 viral copies this assay can detect is 138 copies/mL. A negative result does not preclude SARS-Cov-2 infection and should not be used as the sole basis for treatment or other patient management decisions. A negative result  may occur with  improper specimen collection/handling, submission of specimen other than nasopharyngeal swab, presence of viral mutation(s) within the areas targeted by this assay, and inadequate number of viral copies(<138 copies/mL). A negative result must be combined with clinical observations, patient history, and epidemiological information. The expected result is Negative.  Fact Sheet for Patients:  EntrepreneurPulse.com.au  Fact Sheet for Healthcare Providers:  IncredibleEmployment.be  This test is no t yet approved or cleared by the [place] FDA and  has been authorized for detection and/or diagnosis of SARS-CoV-2 by FDA under an Emergency Use Authorization (EUA). This EUA will remain  in effect (meaning this test can  be used) for the duration of the COVID-19 declaration under Section 564(b)(1) of the Act, 21 U.S.C.section 360bbb-3(b)(1), unless the authorization is terminated  or revoked sooner.       Influenza A by PCR NEGATIVE NEGATIVE Final   Influenza B by PCR NEGATIVE NEGATIVE Final    Comment: (NOTE) The Xpert Xpress SARS-CoV-2/FLU/RSV plus assay is intended as an aid in the diagnosis of influenza from Nasopharyngeal swab specimens and should not be used as a sole basis for treatment. Nasal washings and aspirates are unacceptable for Xpert Xpress SARS-CoV-2/FLU/RSV testing.  Fact Sheet for Patients: EntrepreneurPulse.com.au  Fact Sheet for Healthcare Providers: IncredibleEmployment.be  This test is not yet approved or cleared by the [place] FDA and has been authorized for detection and/or diagnosis of SARS-CoV-2 by FDA under an Emergency Use Authorization (EUA). This EUA will remain in effect (meaning this test can be used) for the duration of the COVID-19 declaration under Section 564(b)(1) of the Act, 21 U.S.C. section 360bbb-3(b)(1), unless the authorization is terminated  or revoked.  Performed at [facility], [place] [address], [place], [place] [address]   Surgical PCR screen     Status: Abnormal   Collection Time: 03/26/21  2:01 AM   Specimen: Nasal Mucosa; Nasal Swab  Result Value Ref Range Status   MRSA, PCR NEGATIVE NEGATIVE Final   Staphylococcus aureus POSITIVE (A) NEGATIVE Final    Comment: (NOTE) The Xpert SA Assay (FDA approved for NASAL specimens in patients 7 years of age and older), is one component of a comprehensive surveillance program. It is not intended to diagnose infection nor to guide or monitor treatment. Performed at [facility], [place] [address] [place], [place] [address]     Procedures/Studies: CT Abdomen Pelvis Wo Contrast  Addendum Date: 03/25/2021   ADDENDUM REPORT: 03/25/2021 23:01 ADDENDUM: Bilateral lower lobe nodules are identified as described which are slightly increased when compared with 2017. Non-contrast chest CT at 3-6 months is recommended. If the nodules are stable at time of repeat CT, then future CT at 18-24 months (from today's scan) is considered optional for low-risk patients, but is recommended for high-risk patients. This recommendation follows the consensus statement: Guidelines for Management of Incidental Pulmonary Nodules Detected on CT Images: From the Fleischner Society 2017; Radiology 2017; 284:228-243. Electronically Signed   By: Inez Catalina M.D.   On: 03/25/2021 23:01   Result Date: 03/25/2021 CLINICAL DATA:  Abdominal pain on the right, initial encounter EXAM: CT ABDOMEN AND PELVIS WITHOUT CONTRAST TECHNIQUE: Multidetector CT imaging of the abdomen and pelvis was performed following the standard protocol without IV contrast. COMPARISON:  02/23/2016 FINDINGS: Lower chest: Lung bases are free of acute infiltrate. Bilateral lower lobe nodules are noted measuring up to 9 mm on the left and 6 mm on the right. These both show slight increase in size when compared with  the prior exam. The larger measured approximately 7 mm on the prior study. Hepatobiliary: Scattered hypodensities are noted throughout the liver consistent with simple cyst. These are stable from the prior exam. Gallbladder is well distended with dependent density consistent with small stones. No wall thickening or inflammatory changes are noted. Pancreas: Unremarkable. No pancreatic ductal dilatation or surrounding inflammatory changes. Spleen: Normal in size without focal abnormality. Adrenals/Urinary Tract: Adrenal glands are within normal limits. Kidneys demonstrate no renal calculi or obstructive changes. Bladder is well distended. Findings suspicious for any anterior bladder diverticulum are noted. Stomach/Bowel: Scattered diverticular change is seen without evidence of  diverticulitis. No obstructive changes of the colon are seen. The appendix has been surgically removed stomach is unremarkable. Proximal small bowel is unremarkable. There are a few loops which are mildly prominent but measure less than 3 cm in diameter. No definitive obstructive changes are seen as contrast passes distally into the proximal colon. Vascular/Lymphatic: Aortic atherosclerosis. No enlarged abdominal or pelvic lymph nodes. Reproductive: Uterus and bilateral adnexa are unremarkable. Pessary is noted in place. Other: No abdominal wall hernia or abnormality. No abdominopelvic ascites. Musculoskeletal: Degenerative changes of lumbar spine are seen with scoliosis concave to the left. IMPRESSION: Diverticulosis without diverticulitis. Mildly prominent small bowel although measuring less than 3 cm in diameter and contrast material passes through this area without difficulty. No inflammatory changes are seen. Cholelithiasis without definitive complicating factors. Ultrasound may be helpful for further evaluation. No other focal abnormality is noted. Electronically Signed: By: Inez Catalina M.D. On: 03/25/2021 22:52   DG ERCP  Result  Date: 03/27/2021 CLINICAL DATA:  Choledocholithiasis. EXAM: ERCP TECHNIQUE: Multiple spot images obtained with the fluoroscopic device and submitted for interpretation post-procedure. FLUOROSCOPY TIME:  1 minute, 47 seconds (27 mGy) COMPARISON:  MRCP-03/26/2021; CT abdomen pelvis-03/25/2021 FINDINGS: Several spot fluoroscopic images of the right upper abdominal quadrant during ERCP are provided for review. Initial image demonstrates an ERCP probe overlying the right upper abdominal quadrant. Note is made of a moderate to severe scoliotic curvature of the thoracolumbar spine. Subsequent images demonstrate selective cannulation and opacification of the CBD which appears moderately dilated. There are two persistent nonocclusive filling defects within the CBD compatible with choledocholithiasis, demonstrated on preceding cross-sectional imaging. Subsequent images demonstrate insufflation of a balloon within distal aspect of the CBD with presumed biliary sweeping, stone extraction and sphincterotomy. There is minimal opacification of the intrahepatic biliary tree which appears nondilated. There is no definitive opacification of either the cystic or pancreatic ducts. IMPRESSION: ERCP with findings of choledocholithiasis with subsequent biliary sweeping, stone extraction and presumed sphincterotomy. These images were submitted for radiologic interpretation only. Please see the procedural report for the amount of contrast and the fluoroscopy time utilized. Electronically Signed   By: Sandi Mariscal M.D.   On: 03/27/2021 09:07   MR ABDOMEN MRCP W WO CONTAST  Result Date: 03/26/2021 CLINICAL DATA:  Jaundice. EXAM: MRI ABDOMEN WITHOUT AND WITH CONTRAST (INCLUDING MRCP) TECHNIQUE: Multiplanar multisequence MR imaging of the abdomen was performed both before and after the administration of intravenous contrast. Heavily T2-weighted images of the biliary and pancreatic ducts were obtained, and three-dimensional MRCP images were  rendered by post processing. CONTRAST:  43mL GADAVIST GADOBUTROL 1 MMOL/ML IV SOLN COMPARISON:  03/25/2021. FINDINGS: Lower chest: No acute findings. Hepatobiliary: Multiple T2 hyperintensities are present in the liver, compatible with cysts. The common bile duct is distended measuring 1.2 cm in diameter. Multiple stones are identified in the mid to distal common bile duct, the largest measuring 1 cm in diameter. Stones are present within the gallbladder. Pancreas: No mass, inflammatory changes, or other parenchymal abnormality identified. Spleen:  Within normal limits in size and appearance. Adrenals/Urinary Tract: The adrenal glands are within normal limits. Bilateral renal cysts are noted. There is no hydronephrosis bilaterally. Stomach/Bowel: Visualized portions within the abdomen are unremarkable. Vascular/Lymphatic: A few prominent lymph nodes are noted anterior to the inferior vena cava in the right upper quadrant which may be reactive. The aorta is normal in caliber. Other:  No free fluid in the abdomen. Musculoskeletal: Dextroscoliosis of the lumbar spine. IMPRESSION: 1. Distended common bile duct  with choledocholithiasis. ERCP is recommended for follow-up. 2. Cholelithiasis. 3. Hepatic cysts. 4. Bilateral renal cysts. Electronically Signed   By: Brett Fairy M.D.   On: 03/26/2021 01:13   US Abdomen Limited RUQ (LIVER/GB)  Result Date: 03/25/2021 CLINICAL DATA:  Right upper quadrant pain. EXAM: ULTRASOUND ABDOMEN LIMITED RIGHT UPPER QUADRANT COMPARISON:  CT abdomen 02/23/2016 FINDINGS: Gallbladder: Cholelithiasis without gallbladder wall thickening or pericholecystic fluid. Negative sonographic Murphy sign. Largest gallstone measures 7 mm. Common bile duct: Diameter: 3 mm Liver: Normal hepatic parenchymal echogenicity. Multiple anechoic hepatic masses consistent with cysts unchanged compared with the prior examination of 02/23/2016. There is a 1.6 x 1.5 x 1.4 cm right hepatic cyst. There is a 2.6 x 2.3  x 2.3 cm right hepatic cyst. There is a 9 x 9 x 11 mm left hepatic cyst. Mild prominence of the intrahepatic biliary ducts. Portal vein is patent on color Doppler imaging with normal direction of blood flow towards the liver. Other: None. IMPRESSION: 1. Cholelithiasis without sonographic evidence of acute cholecystitis. 2. Multiple hepatic cysts. Electronically Signed   By: Kathreen Devoid M.D.   On: 03/25/2021 18:45    Labs: BNP (last 3 results) No results for input(s): BNP in the last 8760 hours. Basic Metabolic Panel: Recent Labs  Lab 03/28/21 0909 03/29/21 0527 03/30/21 0518 03/31/21 0539 04/01/21 0530  NA 134* 132* 133* 130* 132*  K 4.0 3.8 3.6 3.5 3.3*  CL 107 104 104 99 101  CO2 21* 23 24 24 26   GLUCOSE 128* 89 83 90 89  BUN 8 9 8  6* <5*  CREATININE 0.90 1.01* 0.97 0.89 0.83  CALCIUM 9.0 9.0 8.8* 9.1 8.7*  MG 1.8  --   --   --   --    Liver Function Tests: Recent Labs  Lab 03/25/21 1557 03/26/21 0338 03/27/21 0546 03/28/21 0909 03/31/21 0539  AST 356* 169* 69* 42* 26  ALT 294* 216* 134* 95* 47*  ALKPHOS 174* 124 117 102 82  BILITOT 2.6* 2.9* 1.0 1.0 1.0  PROT 6.8 5.1* 5.2* 5.4* 5.3*  ALBUMIN 3.9 2.9* 3.0* 3.1* 2.9*   Recent Labs  Lab 03/25/21 1557 03/31/21 0539  LIPASE 26 23   No results for input(s): AMMONIA in the last 168 hours. CBC: Recent Labs  Lab 03/25/21 1557 03/26/21 0338 03/27/21 0546 03/28/21 0909 03/29/21 0527 03/30/21 0518 03/31/21 0539 04/01/21 0530  WBC 27.4* 23.0*   < > 20.3* 27.7* 19.2* 25.6* 21.7*  NEUTROABS 8.3* 7.7  --   --   --  4.2 3.9 3.4  HGB 12.2 9.8*   < > 10.1* 10.5* 9.8* 10.9* 9.9*  HCT 36.8 28.3*   < > 31.3* 31.1* 29.4* 32.4* 29.4*  MCV 100.8* 99.3   < > 105.0* 101.3* 101.0* 101.6* 100.0  PLT 261 184   < > 174 203 165 185 184   < > = values in this interval not displayed.   Cardiac Enzymes: No results for input(s): CKTOTAL, CKMB, CKMBINDEX, TROPONINI in the last 168 hours. BNP: Invalid input(s): POCBNP CBG: No  results for input(s): GLUCAP in the last 168 hours. D-Dimer No results for input(s): DDIMER in the last 72 hours. Hgb A1c No results for input(s): HGBA1C in the last 72 hours. Lipid Profile No results for input(s): CHOL, HDL, LDLCALC, TRIG, CHOLHDL, LDLDIRECT in the last 72 hours. Thyroid function studies No results for input(s): TSH, T4TOTAL, T3FREE, THYROIDAB in the last 72 hours.  Invalid input(s): FREET3 Anemia work up No results for  input(s): VITAMINB12, FOLATE, FERRITIN, TIBC, IRON, RETICCTPCT in the last 72 hours. Urinalysis    Component Value Date/Time   COLORURINE YELLOW 03/26/2021 0352   APPEARANCEUR CLEAR 03/26/2021 0352   LABSPEC 1.015 03/26/2021 0352   PHURINE 7.0 03/26/2021 0352   GLUCOSEU NEGATIVE 03/26/2021 0352   HGBUR NEGATIVE 03/26/2021 0352   HGBUR negative 05/02/2009 1246   BILIRUBINUR SMALL (A) 03/26/2021 0352   BILIRUBINUR neg 08/30/2019 1413   KETONESUR NEGATIVE 03/26/2021 0352   PROTEINUR TRACE (A) 03/26/2021 0352   UROBILINOGEN 1.0 08/30/2019 1413   UROBILINOGEN 0.2 05/02/2009 1246   NITRITE NEGATIVE 03/26/2021 0352   LEUKOCYTESUR SMALL (A) 03/26/2021 0352   Sepsis Labs Invalid input(s): PROCALCITONIN,  WBC,  LACTICIDVEN Microbiology Recent Results (from the past 240 hour(s))  Resp Panel by RT-PCR (Flu A&B, Covid) Nasopharyngeal Swab     Status: None   Collection Time: 03/25/21  8:26 PM   Specimen: Nasopharyngeal Swab; Nasopharyngeal(NP) swabs in vial transport medium  Result Value Ref Range Status   SARS Coronavirus 2 by RT PCR NEGATIVE NEGATIVE Final    Comment: (NOTE) SARS-CoV-2 target nucleic acids are NOT DETECTED.  The SARS-CoV-2 RNA is generally detectable in upper respiratory specimens during the acute phase of infection. The lowest concentration of SARS-CoV-2 viral copies this assay can detect is 138 copies/mL. A negative result does not preclude SARS-Cov-2 infection and should not be used as the sole basis for treatment or other  patient management decisions. A negative result may occur with  improper specimen collection/handling, submission of specimen other than nasopharyngeal swab, presence of viral mutation(s) within the areas targeted by this assay, and inadequate number of viral copies(<138 copies/mL). A negative result must be combined with clinical observations, patient history, and epidemiological information. The expected result is Negative.  Fact Sheet for Patients:  EntrepreneurPulse.com.au  Fact Sheet for Healthcare Providers:  IncredibleEmployment.be  This test is no t yet approved or cleared by the [place] FDA and  has been authorized for detection and/or diagnosis of SARS-CoV-2 by FDA under an Emergency Use Authorization (EUA). This EUA will remain  in effect (meaning this test can be used) for the duration of the COVID-19 declaration under Section 564(b)(1) of the Act, 21 U.S.C.section 360bbb-3(b)(1), unless the authorization is terminated  or revoked sooner.       Influenza A by PCR NEGATIVE NEGATIVE Final   Influenza B by PCR NEGATIVE NEGATIVE Final    Comment: (NOTE) The Xpert Xpress SARS-CoV-2/FLU/RSV plus assay is intended as an aid in the diagnosis of influenza from Nasopharyngeal swab specimens and should not be used as a sole basis for treatment. Nasal washings and aspirates are unacceptable for Xpert Xpress SARS-CoV-2/FLU/RSV testing.  Fact Sheet for Patients: EntrepreneurPulse.com.au  Fact Sheet for Healthcare Providers: IncredibleEmployment.be  This test is not yet approved or cleared by the [place] FDA and has been authorized for detection and/or diagnosis of SARS-CoV-2 by FDA under an Emergency Use Authorization (EUA). This EUA will remain in effect (meaning this test can be used) for the duration of the COVID-19 declaration under Section 564(b)(1) of the Act, 21 U.S.C. section  360bbb-3(b)(1), unless the authorization is terminated or revoked.  Performed at [facility], [place] [address] [place], [place] [address]   Surgical PCR screen     Status: Abnormal   Collection Time: 03/26/21  2:01 AM   Specimen: Nasal Mucosa; Nasal Swab  Result Value Ref Range Status   MRSA, PCR NEGATIVE NEGATIVE Final   Staphylococcus aureus  POSITIVE (A) NEGATIVE Final    Comment: (NOTE) The Xpert SA Assay (FDA approved for NASAL specimens in patients 107 years of age and older), is one component of a comprehensive surveillance program. It is not intended to diagnose infection nor to guide or monitor treatment. Performed at [facility], [place] [address] [place], [place] [address]      Time coordinating discharge: 25 minutes  SIGNED: Antonieta Pert, MD  [facility] 04/01/2021, 12:53 PM  If 7PM-7AM, please contact night-coverage [email or web address]

## 2021-04-01 NOTE — Progress Notes (Signed)
Nutrition Follow-up  DOCUMENTATION CODES:   Non-severe (moderate) malnutrition in context of acute illness/injury  INTERVENTION:   -Boost Plus daily- Each supplement provides 360kcal and 14g protein.     -Boost Breeze po BID, each supplement provides 250 kcal and 9 grams of protein  -Multivitamin with minerals daily  NUTRITION DIAGNOSIS:   Moderate Malnutrition related to acute illness (biliary colic d/t gallbladder sludge) as evidenced by moderate fat depletion, moderate muscle depletion.  Ongoing.  GOAL:   Patient will meet greater than or equal to 90% of their needs  Progressing.  MONITOR:   Diet advancement, PO intake, Supplement acceptance, Labs, Weight trends, I & O's  ASSESSMENT:   87 yo female with a PMH of  HTN, chronic hyponatremia, GERD, and hypothyroidism who presented with abdominal pain, nausea and vomiting. She was diagnosed with biliary colic due gallbladder sludge in November 2022, and referred to surgical evaluation. Her appointment was scheduled for 03/30/21. Because of persistent and worsening symptoms she presented to the hospital for more urgent evaluation.  12/22: admitted 12/24: s/p ERCP 12/25: s/p lap cholecystectomy  Patient currently consuming 50-100% of meals. Boost Plus ordered but pt only accepting 2 in the last 4 days. Will order Boost Breeze to see if she likes this more.  Admission weight: 115 lbs Current weight: 127 lbs. I/Os:  +3.3L since admit  Medications: Multivitamin with minerals daily, Imodium  Labs reviewed: Low Na, K  Diet Order:   Diet Order             Diet regular Room service appropriate? Yes; Fluid consistency: Thin  Diet effective now                   EDUCATION NEEDS:   Education needs have been addressed  Skin:  Skin Assessment: Skin Integrity Issues: Skin Integrity Issues:: Incisions Incisions: 12/25 abdomen  Last BM:  12/28  Height:   Ht Readings from Last 1 Encounters:  03/26/21 5' 2.5"  (1.588 m)    Weight:   Wt Readings from Last 1 Encounters:  04/01/21 57.9 kg    BMI:  Body mass index is 22.97 kg/m.  Estimated Nutritional Needs:   Kcal:  1800-2000  Protein:  75-90 grams  Fluid:  >1.8 L  Clayton Bibles, MS, RD, LDN Inpatient Clinical Dietitian Contact information available via Amion

## 2021-04-06 ENCOUNTER — Telehealth: Payer: Self-pay

## 2021-04-06 NOTE — Telephone Encounter (Signed)
PT had some procedures recently at the hospital and is home now. PT took some strong antibiotics and having some thrush now.  -Caller states she recently took antibiotics and now her mouth is red and irritated. She states symptoms have been going on for a week  04/03/2021 9:59:25 Notus, RN, Rebecca [name]  Verbal Orders/Maintenance Medications Medication Refill Route Dosage Regime Duration Admin Instructions User Name nystatin Oral every four hours swish in mouth every four hours Rebecca [name], Rebecca [name]  Comments User: Rebecca Blew, RN Date/Time Rebecca [name] Time): 04/03/2021 10:21:23 AM I called Nystatin into the pharmacy per Dr. Rometta Emery orders and informed patient that I have done so.

## 2021-04-06 NOTE — Telephone Encounter (Signed)
Transition Care Management Follow-up Telephone Call Date of discharge and from where: [facility] 04/01/21 Dx: sepsis How have you been since you were released from the hospital? Feeling better  Any questions or concerns? Yes  Items Reviewed: Did the pt receive and understand the discharge instructions provided? Yes  Medications obtained and verified? Yes  Other? No  Any new allergies since your discharge? No  Dietary orders reviewed? Yes Do you have support at home? Yes   Home Care and Equipment/Supplies: Were home health services ordered? No  Has the agency set up a time to come to the patient's home? not applicable Were any new equipment or medical supplies ordered?  Yes: rolling walker  What is the name of the medical supply agency? [facility]  Were you able to get the supplies/equipment? yes Do you have any questions related to the use of the equipment or supplies? No  Functional Questionnaire: (I = Independent and D = Dependent) ADLs: I  Bathing/Dressing- I  Meal Prep- I  Eating- I  Maintaining continence- I  Transferring/Ambulation- I  Managing Meds- I  Follow up appointments reviewed:  PCP Hospital f/u appt confirmed? Yes  Scheduled to see Dr Elease Hashimoto on 04-09-21 @ [facility] f/u appt confirmed? No  . Are transportation arrangements needed? No If their condition worsens, is the pt aware to call PCP or go to the Emergency Dept.? Yes Was the patient provided with contact information for the PCP's office or ED? Yes Was to pt encouraged to call back with questions or concerns? Yes

## 2021-04-09 ENCOUNTER — Ambulatory Visit (INDEPENDENT_AMBULATORY_CARE_PROVIDER_SITE_OTHER): Payer: Medicare Other | Admitting: Family Medicine

## 2021-04-09 VITALS — BP 160/70 | HR 54 | Temp 97.8°F | Wt 111.2 lb

## 2021-04-09 DIAGNOSIS — R531 Weakness: Secondary | ICD-10-CM

## 2021-04-09 DIAGNOSIS — I10 Essential (primary) hypertension: Secondary | ICD-10-CM | POA: Diagnosis not present

## 2021-04-09 DIAGNOSIS — D649 Anemia, unspecified: Secondary | ICD-10-CM | POA: Diagnosis not present

## 2021-04-09 DIAGNOSIS — E876 Hypokalemia: Secondary | ICD-10-CM | POA: Diagnosis not present

## 2021-04-09 LAB — COMPREHENSIVE METABOLIC PANEL
ALT: 18 U/L (ref 0–35)
AST: 23 U/L (ref 0–37)
Albumin: 4 g/dL (ref 3.5–5.2)
Alkaline Phosphatase: 81 U/L (ref 39–117)
BUN: 6 mg/dL (ref 6–23)
CO2: 28 mEq/L (ref 19–32)
Calcium: 10.1 mg/dL (ref 8.4–10.5)
Chloride: 101 mEq/L (ref 96–112)
Creatinine, Ser: 0.76 mg/dL (ref 0.40–1.20)
GFR: 71.09 mL/min (ref >60.00)
Glucose, Bld: 96 mg/dL (ref 70–99)
Potassium: 4.1 mEq/L (ref 3.5–5.1)
Sodium: 135 mEq/L (ref 135–145)
Total Bilirubin: 0.7 mg/dL (ref 0.2–1.2)
Total Protein: 6.3 g/dL (ref 6.0–8.3)

## 2021-04-09 LAB — CBC WITH DIFFERENTIAL/PLATELET
Basophils Absolute: 0 10*3/uL (ref 0.0–0.1)
Basophils Relative: 0.2 % (ref 0.0–3.0)
Eosinophils Absolute: 0.2 10*3/uL (ref 0.0–0.7)
Eosinophils Relative: 1 % (ref 0.0–5.0)
HCT: 33.4 % — ABNORMAL LOW (ref 36.0–46.0)
Hemoglobin: 11.1 g/dL — ABNORMAL LOW (ref 12.0–15.0)
Lymphocytes Relative: 78.9 % — ABNORMAL HIGH (ref 12.0–46.0)
Lymphs Abs: 15.2 10*3/uL — ABNORMAL HIGH (ref 0.7–4.0)
MCHC: 33.3 g/dL (ref 30.0–36.0)
MCV: 99.9 fl (ref 78.0–100.0)
Monocytes Absolute: 0.5 10*3/uL (ref 0.1–1.0)
Monocytes Relative: 2.8 % — ABNORMAL LOW (ref 3.0–12.0)
Neutro Abs: 3.3 10*3/uL (ref 1.4–7.7)
Neutrophils Relative %: 17.1 % — ABNORMAL LOW (ref 43.0–77.0)
Platelets: 219 10*3/uL (ref 150.0–400.0)
RBC: 3.35 Mil/uL — ABNORMAL LOW (ref 3.87–5.11)
RDW: 14.2 % (ref 11.5–15.5)
WBC: 19.2 10*3/uL (ref 4.0–10.5)

## 2021-04-09 NOTE — Progress Notes (Signed)
Established Patient Office Visit  Subjective:  Patient ID: Rebecca [name], female    DOB: Jun 18, 1932  Age: 88 y.o. MRN: [id number]  CC:  Chief Complaint  Patient presents with   Hospitalization Follow-up    HPI Rebecca [name] presents for hospital follow-up.  She had had recent atypical chest pain and episode of transient jaundice.  Her ultrasound showed gallbladder sludge.  We had referred her to general surgeon for outpatient consult but in the process of waiting to be seen she became acutely ill and was admitted on 12/22 with concern for sepsis and cholangitis.  She had elevated LFTs.  She was treated with broad-spectrum antibiotics.  She underwent ERCP on 12-24 and then laparoscopic cholecystectomy the next day.  She has been gradually improving some since then.  She has significant leukocytosis on admission.  She had some anemia following surgery.  This was relatively mild.  She had potassium 3.2 at discharge.  She does have hypertension and currently on losartan.  Blood pressures have generally been well controlled but are slightly up today.  Patient does continue to have poor appetite but is eating fairly regularly and drinking fluids fairly well.  No fever.  No abdominal pain.  No further chest pain.  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[place])    Tubular adenoma of colon 05/2009    Past Surgical History:  Procedure Laterality Date   APPENDECTOMY     ARCUATE KERATECTOMY     BILIARY DILATION  03/27/2021   Procedure: BILIARY DILATION;  Surgeon: [name], MD;  Location: [facility];  Service: Gastroenterology;;   CATARACT EXTRACTION, BILATERAL  2015   CHOLECYSTECTOMY  N/A 03/28/2021   Procedure: LAPAROSCOPIC CHOLECYSTECTOMY;  Surgeon: Leighton Ruff, MD;  Location: [facility];  Service: General;  Laterality: N/A;   double pallital tori bone removed      ERCP N/A 03/27/2021   Procedure: ENDOSCOPIC RETROGRADE CHOLANGIOPANCREATOGRAPHY (ERCP);  Surgeon: [name], MD;  Location: [facility];  Service: Gastroenterology;  Laterality: N/A;   HERNIA REPAIR     umbilical   KNEE SURGERY Bilateral    morton's neuroma on rt foot and platar facial release     MOUTH SURGERY     "bone shaved off roof of mouth"   REMOVAL OF STONES  03/27/2021   Procedure: REMOVAL OF STONES;  Surgeon: [name], MD;  Location: [facility];  Service: Gastroenterology;;   SALIVARY GLAND SURGERY     removal   SPHINCTEROTOMY  03/27/2021   Procedure: SPHINCTEROTOMY;  Surgeon: [name], MD;  Location: [facility];  Service: Gastroenterology;;   TONSILLECTOMY      Family History  Problem Relation Age of Onset   Diabetes Mother    Heart disease Mother    Hyperlipidemia Mother    Hypertension Mother    Anuerysm Father    Heart disease Father    Hypertension Father    Migraines Daughter    Kidney Stones Daughter    Colon polyps Sister    Lung cancer Sister    Colon polyps Brother    Diabetes  Brother    Hyperlipidemia Brother    Hypertension Brother    Colon polyps Daughter    Breast cancer Daughter    Colon cancer Maternal Aunt        Rectal cancer   Cancer Daughter        salivary   Colon cancer Paternal Grandmother        with possible stomach cancer   Heart disease Brother    Crohn's disease Neg Hx    Pancreatic cancer Neg Hx     Social History   Socioeconomic History   Marital status: Widowed    Spouse name: Not on file   Number of children: 3   Years of education: Not on file   Highest education level: Not on file  Occupational History   Occupation: Glass blower/designer    Comment: retired  Tobacco Use   Smoking status: Never   Smokeless  tobacco: Never  Vaping Use   Vaping Use: Never used  Substance and Sexual Activity   Alcohol use: Yes    Alcohol/week: 0.0 standard drinks    Comment: rare   Drug use: No   Sexual activity: Yes  Other Topics Concern   Not on file  Social History Narrative   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   Lives alone on [address], 3 children, 7 grandchildren, 2 Designer, industrial/product, all local and supportive   Attends church   Enjoys reading, especially mysteries   Walks for exercise                     Social Determinants of Radio broadcast assistant Strain: Not on file  Food Insecurity: Not on file  Transportation Needs: Not on file  Physical Activity: Not on file  Stress: Not on file  Social Connections: Not on file  Intimate Partner Violence: Not on file    Outpatient Medications Prior to Visit  Medication Sig Dispense Refill   ALPRAZolam (XANAX) 0.25 MG tablet TAKE 1 TABLET(0.25 MG) BY MOUTH AT BEDTIME AS NEEDED (Patient taking differently: Take 0.25 mg by mouth at bedtime as needed for sleep.) 20 tablet 1   Calcium Carb-Cholecalciferol (CALCIUM-VITAMIN D) 600-400 MG-UNIT TABS Take 1 tablet by mouth daily.     Cholecalciferol (VITAMIN D3) 2000 UNITS TABS Take 1 tablet by mouth daily.     diclofenac sodium (VOLTAREN) 1 % GEL Apply 2 g topically 4 (four) times daily. 1 Tube 1   esomeprazole (NEXIUM) 20 MG capsule Take 20 mg by mouth 2 (two) times daily before a meal.     latanoprost (XALATAN) 0.005 % ophthalmic solution Place 1 drop into both eyes at bedtime.     levothyroxine (SYNTHROID) 88 MCG tablet TAKE 1 TABLET(88 MCG) BY MOUTH DAILY (Patient taking differently: Take 88 mcg by mouth daily before breakfast.) 90 tablet 1   loperamide (IMODIUM) 2 MG capsule Take 2 capsules (4 mg total) by mouth every 6 (six) hours as needed for diarrhea or loose stools. 30 capsule 0   losartan (COZAAR) 100 MG tablet TAKE 1 TABLET BY MOUTH EVERY DAY (Patient  taking differently: Take 100 mg by mouth daily.) 90 tablet 1   metoCLOPramide (REGLAN) 5 MG tablet TAKE 1 TABLET BY MOUTH TWICE DAILY WITH MEALS 60 tablet 3   nystatin (MYCOSTATIN) 100000 UNIT/ML suspension Take 5 mLs by mouth every 4 (four) hours.     ondansetron (ZOFRAN-ODT) 4 MG disintegrating tablet DISSOLVE 1 TABLET(4 MG) ON THE TONGUE EVERY  8 HOURS AS NEEDED FOR NAUSEA OR VOMITING (Patient taking differently: Take 4 mg by mouth every 8 (eight) hours as needed for nausea or vomiting.) 12 tablet 0   rosuvastatin (CRESTOR) 20 MG tablet TAKE 1 TABLET(20 MG) BY MOUTH DAILY (Patient taking differently: Take 20 mg by mouth daily.) 30 tablet 5   timolol (TIMOPTIC) 0.5 % ophthalmic solution Place 1 drop into both eyes 2 (two) times daily.     vitamin B-12 (CYANOCOBALAMIN) 500 MCG tablet Take 1,000 mcg by mouth every morning.     vitamin C (ASCORBIC ACID) 500 MG tablet Take 500 mg by mouth daily.     No facility-administered medications prior to visit.    Allergies  Allergen Reactions   Levaquin [Levofloxacin] Other (See Comments)    Very nauseous and shakey   Sulfamethoxazole-Trimethoprim Nausea Only    shakey   Lactose Intolerance (Gi) Diarrhea and Other (See Comments)    Stomach hurts    Tramadol Hcl Nausea Only and Palpitations   Augmentin [Amoxicillin-Pot Clavulanate] Diarrhea   Ciprofloxacin Nausea Only    REACTION: Thrush, Nausea, Shakey   Pneumococcal Vaccine Polyvalent     REACTION: RED AND RAISED RASH ON ARM   Adhesive [Tape] Rash   Codeine Nausea Only    ROS Review of Systems  Constitutional:  Positive for appetite change. Negative for chills and fever.  Respiratory:  Negative for cough and shortness of breath.   Cardiovascular:  Negative for chest pain.  Gastrointestinal:  Negative for abdominal pain.  Genitourinary:  Negative for dysuria.     Objective:    Physical Exam Vitals reviewed.  Constitutional:      Appearance: Normal appearance.  Cardiovascular:      Rate and Rhythm: Normal rate and regular rhythm.  Pulmonary:     Effort: Pulmonary effort is normal.     Breath sounds: Normal breath sounds.  Abdominal:     General: There is no distension.     Palpations: Abdomen is soft.     Tenderness: There is no abdominal tenderness.     Comments: Wounds are healing well from recent laparoscopic cholecystectomy  Neurological:     Mental Status: She is alert.    BP (!) 160/70    Pulse (!) 54    Temp 97.8 F (36.6 C) (Oral)    Wt 111 lb 3.2 oz (50.4 kg)    SpO2 95%    BMI 20.01 kg/m  Wt Readings from Last 3 Encounters:  04/09/21 111 lb 3.2 oz (50.4 kg)  04/01/21 127 lb 10.3 oz (57.9 kg)  02/17/21 117 lb 6.4 oz (53.3 kg)     There are no preventive care reminders to display for this patient.  There are no preventive care reminders to display for this patient.  Lab Results  Component Value Date   TSH 0.766 03/26/2021   Lab Results  Component Value Date   WBC 21.7 (H) 04/01/2021   HGB 9.9 (L) 04/01/2021   HCT 29.4 (L) 04/01/2021   MCV 100.0 04/01/2021   PLT 184 04/01/2021   Lab Results  Component Value Date   NA 132 (L) 04/01/2021   K 3.3 (L) 04/01/2021   CO2 26 04/01/2021   GLUCOSE 89 04/01/2021   BUN <5 (L) 04/01/2021   CREATININE 0.83 04/01/2021   BILITOT 1.0 03/31/2021   ALKPHOS 82 03/31/2021   AST 26 03/31/2021   ALT 47 (H) 03/31/2021   PROT 5.3 (L) 03/31/2021   ALBUMIN 2.9 (L) 03/31/2021   CALCIUM  8.7 (L) 04/01/2021   ANIONGAP 5 04/01/2021   GFR 60.51 02/17/2021   Lab Results  Component Value Date   CHOL 132 08/21/2020   Lab Results  Component Value Date   HDL 39.20 08/21/2020   Lab Results  Component Value Date   LDLCALC 55 08/21/2020   Lab Results  Component Value Date   TRIG 188.0 (H) 08/21/2020   Lab Results  Component Value Date   CHOLHDL 3 08/21/2020   Lab Results  Component Value Date   HGBA1C 5.6 07/26/2006      Assessment & Plan:   #1 recent acute cholangitis.  Patient had emergent  ERCP and surgery has done well since then.  No further abdominal pain.  Recent elevated liver transaminases and suspect these will be trending downward.  #2 postoperative anemia.  This was relatively mild.  We will recheck CBC.  #3 mild hypokalemia.  Recent potassium 3.2.  Recheck comprehensive metabolic panel  #4 generalized weakness.  We did discuss possible referral for PT.  She would like to wait a couple weeks.  We suggested nutritional supplements such as Boost and she will consider this.  #5 hypertension.  Slightly up today but generally well controlled in the past.  We recommended close home monitoring over the next week.  If consistently up over 335 systolic be in touch.   No orders of the defined types were placed in this encounter.   Follow-up: No follow-ups on file.    Carolann Littler, MD

## 2021-04-20 ENCOUNTER — Telehealth: Payer: Self-pay | Admitting: Hematology

## 2021-04-20 NOTE — Telephone Encounter (Signed)
Scheduled appt per 1/17 referral. Spoke to pt who is aware of appt date and time. Pt is aware to arrive 15 mins prior to appt time.

## 2021-04-26 ENCOUNTER — Ambulatory Visit (INDEPENDENT_AMBULATORY_CARE_PROVIDER_SITE_OTHER): Payer: Medicare Other | Admitting: Family Medicine

## 2021-04-26 VITALS — BP 140/60 | HR 63 | Temp 97.6°F | Ht 62.0 in | Wt 110.0 lb

## 2021-04-26 DIAGNOSIS — I10 Essential (primary) hypertension: Secondary | ICD-10-CM

## 2021-04-26 DIAGNOSIS — R21 Rash and other nonspecific skin eruption: Secondary | ICD-10-CM

## 2021-04-26 DIAGNOSIS — E785 Hyperlipidemia, unspecified: Secondary | ICD-10-CM | POA: Diagnosis not present

## 2021-04-26 DIAGNOSIS — D72829 Elevated white blood cell count, unspecified: Secondary | ICD-10-CM | POA: Diagnosis not present

## 2021-04-26 DIAGNOSIS — E039 Hypothyroidism, unspecified: Secondary | ICD-10-CM

## 2021-04-26 MED ORDER — CICLOPIROX OLAMINE 0.77 % EX CREA: TOPICAL_CREAM | Freq: Two times a day (BID) | CUTANEOUS | 1 refills | Status: DC

## 2021-04-26 NOTE — Progress Notes (Signed)
Established Patient Office Visit  Subjective:  Patient ID: Rebecca [name], female    DOB: July 18, 1932  Age: 88 y.o. MRN: [id number]  CC:  Chief Complaint  Patient presents with   Annual Exam    HPI Rebecca [name] presents for medical follow-up.  Back in November she developed biliary colic and ultrasound showed gallbladder sludge.  We had referred her to general surgeon for evaluation but she ended up presenting to the ED with elevated LFTs and increasing white count.  She ended up with ERCP with biliary sphincterotomy and subsequently underwent laparoscopic cholecystectomy on Christmas Day.  She has been slowly recovering since then.  She did have elevated white count and her gallbladder pathology revealed atypical beta cell infiltrate with question of CLL.  Patient does have pending follow-up with hematologist next week.  In looking back over records her white count was normal back in 2019 and then mildly elevated at 11.5 thousand January 2020 and then up to 16.8 thousand 02/10/2021.  Her other medical problems include history of migraine headaches, hypertension, hyperlipidemia, hypertension, osteoporosis, spinal stenosis, hypothyroidism osteoarthritis involving multiple joints.  She had recent TSH normal range.  Last lipids were checked in April 2022.  She does feel like she is slowly recovering though she has still had some fatigue since her surgery.  She still gets yearly mammograms and plans to set that up soon.  She has a rash just slightly under the left breast and also right groin region which came up recently.  Slightly pruritic.  She tried some type of powder to keep the area dry but did not see any improvement.  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[place])    Tubular adenoma of colon 05/2009    Past Surgical History:  Procedure Laterality Date   APPENDECTOMY     ARCUATE KERATECTOMY     BILIARY DILATION  03/27/2021   Procedure: BILIARY DILATION;  Surgeon: [name], MD;  Location: [facility];  Service: Gastroenterology;;   CATARACT EXTRACTION, BILATERAL  2015   CHOLECYSTECTOMY N/A 03/28/2021   Procedure: LAPAROSCOPIC CHOLECYSTECTOMY;  Surgeon: Leighton Ruff, MD;  Location: [facility];  Service: General;  Laterality: N/A;   double pallital tori bone removed      ERCP N/A 03/27/2021   Procedure: ENDOSCOPIC RETROGRADE CHOLANGIOPANCREATOGRAPHY (ERCP);  Surgeon: [name], MD;  Location: [facility];  Service: Gastroenterology;  Laterality: N/A;   HERNIA REPAIR     umbilical   KNEE SURGERY Bilateral    morton's neuroma on rt foot and platar facial release     MOUTH SURGERY     "bone shaved off roof of mouth"   REMOVAL OF STONES  03/27/2021   Procedure: REMOVAL OF STONES;  Surgeon: [name], MD;  Location: [facility];  Service: Gastroenterology;;   SALIVARY GLAND SURGERY     removal   SPHINCTEROTOMY  03/27/2021   Procedure: SPHINCTEROTOMY;  Surgeon: [name], MD;  Location: [facility];  Service: Gastroenterology;;   TONSILLECTOMY      Family History  Problem Relation Age of Onset   Diabetes  Mother    Heart disease Mother    Hyperlipidemia Mother    Hypertension Mother    Anuerysm Father    Heart disease Father    Hypertension Father    Migraines Daughter    Kidney Stones Daughter    Colon polyps Sister    Lung cancer Sister    Colon polyps Brother    Diabetes Brother    Hyperlipidemia Brother    Hypertension Brother    Colon polyps Daughter    Breast cancer Daughter    Colon cancer Maternal Aunt        Rectal cancer   Cancer Daughter        salivary   Colon cancer Paternal Grandmother        with possible stomach  cancer   Heart disease Brother    Crohn's disease Neg Hx    Pancreatic cancer Neg Hx     Social History   Socioeconomic History   Marital status: Widowed    Spouse name: Not on file   Number of children: 3   Years of education: Not on file   Highest education level: Not on file  Occupational History   Occupation: Glass blower/designer    Comment: retired  Tobacco Use   Smoking status: Never   Smokeless tobacco: Never  Vaping Use   Vaping Use: Never used  Substance and Sexual Activity   Alcohol use: Yes    Alcohol/week: 0.0 standard drinks    Comment: rare   Drug use: No   Sexual activity: Yes  Other Topics Concern   Not on file  Social History Narrative   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   Lives alone on [address], 3 children, 7 grandchildren, 2 Designer, industrial/product, all local and supportive   Attends church   Enjoys reading, especially mysteries   Walks for exercise                     Social Determinants of Radio broadcast assistant Strain: Not on file  Food Insecurity: Not on file  Transportation Needs: Not on file  Physical Activity: Not on file  Stress: Not on file  Social Connections: Not on file  Intimate Partner Violence: Not on file    Outpatient Medications Prior to Visit  Medication Sig Dispense Refill   ALPRAZolam (XANAX) 0.25 MG tablet TAKE 1 TABLET(0.25 MG) BY MOUTH AT BEDTIME AS NEEDED (Patient taking differently: Take 0.25 mg by mouth at bedtime as needed for sleep.) 20 tablet 1   Calcium Carb-Cholecalciferol (CALCIUM-VITAMIN D) 600-400 MG-UNIT TABS Take 1 tablet by mouth daily.     Cholecalciferol (VITAMIN D3) 2000 UNITS TABS Take 1 tablet by mouth daily.     diclofenac sodium (VOLTAREN) 1 % GEL Apply 2 g topically 4 (four) times daily. 1 Tube 1   esomeprazole (NEXIUM) 20 MG capsule Take 20 mg by mouth 2 (two) times daily before a meal.     latanoprost (XALATAN) 0.005 % ophthalmic solution Place 1 drop into  both eyes at bedtime.     levothyroxine (SYNTHROID) 88 MCG tablet TAKE 1 TABLET(88 MCG) BY MOUTH DAILY (Patient taking differently: Take 88 mcg by mouth daily before breakfast.) 90 tablet 1   loperamide (IMODIUM) 2 MG capsule Take 2 capsules (4 mg total) by mouth every 6 (six) hours as needed for diarrhea or loose stools. 30 capsule 0   losartan (COZAAR) 100 MG tablet TAKE 1 TABLET BY MOUTH EVERY  DAY (Patient taking differently: Take 100 mg by mouth daily.) 90 tablet 1   metoCLOPramide (REGLAN) 5 MG tablet TAKE 1 TABLET BY MOUTH TWICE DAILY WITH MEALS 60 tablet 3   nystatin (MYCOSTATIN) 100000 UNIT/ML suspension Take 5 mLs by mouth every 4 (four) hours.     ondansetron (ZOFRAN-ODT) 4 MG disintegrating tablet DISSOLVE 1 TABLET(4 MG) ON THE TONGUE EVERY 8 HOURS AS NEEDED FOR NAUSEA OR VOMITING (Patient taking differently: Take 4 mg by mouth every 8 (eight) hours as needed for nausea or vomiting.) 12 tablet 0   rosuvastatin (CRESTOR) 20 MG tablet TAKE 1 TABLET(20 MG) BY MOUTH DAILY (Patient taking differently: Take 20 mg by mouth daily.) 30 tablet 5   timolol (TIMOPTIC) 0.5 % ophthalmic solution Place 1 drop into both eyes 2 (two) times daily.     vitamin B-12 (CYANOCOBALAMIN) 500 MCG tablet Take 1,000 mcg by mouth every morning.     vitamin C (ASCORBIC ACID) 500 MG tablet Take 500 mg by mouth daily.     No facility-administered medications prior to visit.    Allergies  Allergen Reactions   Levaquin [Levofloxacin] Other (See Comments)    Very nauseous and shakey   Sulfamethoxazole-Trimethoprim Nausea Only    shakey   Lactose Intolerance (Gi) Diarrhea and Other (See Comments)    Stomach hurts    Tramadol Hcl Nausea Only and Palpitations   Augmentin [Amoxicillin-Pot Clavulanate] Diarrhea   Ciprofloxacin Nausea Only    REACTION: Thrush, Nausea, Shakey   Pneumococcal Vaccine Polyvalent     REACTION: RED AND RAISED RASH ON ARM   Adhesive [Tape] Rash   Codeine Nausea Only    ROS Review of  Systems  Constitutional:  Positive for fatigue. Negative for chills and fever.  Eyes:  Negative for visual disturbance.  Respiratory:  Negative for cough, chest tightness, shortness of breath and wheezing.   Cardiovascular:  Negative for chest pain, palpitations and leg swelling.  Skin:  Positive for rash.  Neurological:  Negative for dizziness, seizures, syncope, weakness, light-headedness and headaches.     Objective:    Physical Exam Constitutional:      Appearance: She is well-developed.  Eyes:     Pupils: Pupils are equal, round, and reactive to light.  Neck:     Thyroid: No thyromegaly.     Vascular: No JVD.  Cardiovascular:     Rate and Rhythm: Normal rate and regular rhythm.     Heart sounds:    No gallop.  Pulmonary:     Effort: Pulmonary effort is normal. No respiratory distress.     Breath sounds: Normal breath sounds. No wheezing or rales.  Musculoskeletal:     Cervical back: Neck supple.     Right lower leg: No edema.     Left lower leg: No edema.  Skin:    Findings: Rash present.     Comments: Small area of rash right groin region which is erythematous and slightly scaly.  She has similar smaller patch just under her left breast.  Neurological:     Mental Status: She is alert.    BP 140/60 (BP Location: Left Arm, Patient Position: Sitting, Cuff Size: Normal)    Pulse 63    Temp 97.6 F (36.4 C) (Oral)    Ht 5\' 2"  (1.575 m)    Wt 110 lb (49.9 kg)    SpO2 98%    BMI 20.12 kg/m  Wt Readings from Last 3 Encounters:  04/26/21 110 lb (49.9 kg)  04/09/21  111 lb 3.2 oz (50.4 kg)  04/01/21 127 lb 10.3 oz (57.9 kg)     Health Maintenance Due  Topic Date Due   TETANUS/TDAP  07/29/2018    There are no preventive care reminders to display for this patient.  Lab Results  Component Value Date   TSH 0.766 03/26/2021   Lab Results  Component Value Date   WBC 19.2 Repeated and verified X2. (HH) 04/09/2021   HGB 11.1 (L) 04/09/2021   HCT 33.4 (L) 04/09/2021    MCV 99.9 04/09/2021   PLT 219.0 04/09/2021   Lab Results  Component Value Date   NA 135 04/09/2021   K 4.1 04/09/2021   CO2 28 04/09/2021   GLUCOSE 96 04/09/2021   BUN 6 04/09/2021   CREATININE 0.76 04/09/2021   BILITOT 0.7 04/09/2021   ALKPHOS 81 04/09/2021   AST 23 04/09/2021   ALT 18 04/09/2021   PROT 6.3 04/09/2021   ALBUMIN 4.0 04/09/2021   CALCIUM 10.1 04/09/2021   ANIONGAP 5 04/01/2021   GFR 71.09 04/09/2021   Lab Results  Component Value Date   CHOL 132 08/21/2020   Lab Results  Component Value Date   HDL 39.20 08/21/2020   Lab Results  Component Value Date   LDLCALC 55 08/21/2020   Lab Results  Component Value Date   TRIG 188.0 (H) 08/21/2020   Lab Results  Component Value Date   CHOLHDL 3 08/21/2020   Lab Results  Component Value Date   HGBA1C 5.6 07/26/2006      Assessment & Plan:   #1 hypertension adequately controlled.  Blood pressure today initially seated 140/68 and standing 132/68.  Continue current dose of losartan.  #2 hypothyroidism.  Recent TSH at goal.  Continue levothyroxine and recheck labs in 1 year for repeat TSH  #3 hyperlipidemia.  Treated with rosuvastatin.  She will be due for follow-up labs in a few months.  Recheck at 12-month follow-up  #4 skin rash.  Possibly fungal given location.  Loprox cream to use once or twice daily as needed.  Be in touch if rash not clearing in 2 to 3 weeks  #5  leukocytosis.  Question of atypical beta cell infiltrate on recent gallbladder pathology.  Rule out CLL versus other.  Hematology consult pending.  Meds ordered this encounter  Medications   ciclopirox (LOPROX) 0.77 % cream    Sig: Apply topically 2 (two) times daily.    Dispense:  15 g    Refill:  1    Follow-up: Return in about 6 months (around 10/24/2021).    Carolann Littler, MD

## 2021-04-26 NOTE — Patient Instructions (Addendum)
Set up repeat mammogram  Keep follow up with Hematologist  to evaluate elevated WBC  Let's plan to check lipids at 6 month follow up .   BP looks good and continue to monitor     Please let me know if rash not clearing in 2-3 weeks.

## 2021-04-27 DIAGNOSIS — H401132 Primary open-angle glaucoma, bilateral, moderate stage: Secondary | ICD-10-CM | POA: Diagnosis not present

## 2021-04-27 DIAGNOSIS — H52203 Unspecified astigmatism, bilateral: Secondary | ICD-10-CM | POA: Diagnosis not present

## 2021-05-03 DIAGNOSIS — L570 Actinic keratosis: Secondary | ICD-10-CM | POA: Diagnosis not present

## 2021-05-03 DIAGNOSIS — Z85828 Personal history of other malignant neoplasm of skin: Secondary | ICD-10-CM | POA: Diagnosis not present

## 2021-05-05 ENCOUNTER — Inpatient Hospital Stay: Payer: Medicare Other

## 2021-05-05 ENCOUNTER — Other Ambulatory Visit: Payer: Self-pay

## 2021-05-05 ENCOUNTER — Inpatient Hospital Stay: Payer: Medicare Other | Attending: Hematology | Admitting: Hematology

## 2021-05-05 VITALS — BP 152/64 | HR 50 | Temp 97.5°F | Resp 18 | Wt 111.3 lb

## 2021-05-05 DIAGNOSIS — I10 Essential (primary) hypertension: Secondary | ICD-10-CM | POA: Insufficient documentation

## 2021-05-05 DIAGNOSIS — K589 Irritable bowel syndrome without diarrhea: Secondary | ICD-10-CM | POA: Insufficient documentation

## 2021-05-05 DIAGNOSIS — E785 Hyperlipidemia, unspecified: Secondary | ICD-10-CM | POA: Diagnosis not present

## 2021-05-05 DIAGNOSIS — F419 Anxiety disorder, unspecified: Secondary | ICD-10-CM | POA: Diagnosis not present

## 2021-05-05 DIAGNOSIS — K5010 Crohn's disease of large intestine without complications: Secondary | ICD-10-CM | POA: Insufficient documentation

## 2021-05-05 DIAGNOSIS — C9110 Chronic lymphocytic leukemia of B-cell type not having achieved remission: Secondary | ICD-10-CM

## 2021-05-05 DIAGNOSIS — H409 Unspecified glaucoma: Secondary | ICD-10-CM | POA: Insufficient documentation

## 2021-05-05 DIAGNOSIS — Z79899 Other long term (current) drug therapy: Secondary | ICD-10-CM | POA: Insufficient documentation

## 2021-05-05 DIAGNOSIS — K219 Gastro-esophageal reflux disease without esophagitis: Secondary | ICD-10-CM | POA: Insufficient documentation

## 2021-05-05 DIAGNOSIS — Z791 Long term (current) use of non-steroidal anti-inflammatories (NSAID): Secondary | ICD-10-CM | POA: Diagnosis not present

## 2021-05-05 DIAGNOSIS — K3184 Gastroparesis: Secondary | ICD-10-CM | POA: Insufficient documentation

## 2021-05-05 LAB — CBC WITH DIFFERENTIAL/PLATELET
Abs Immature Granulocytes: 0.03 10*3/uL (ref 0.00–0.07)
Basophils Absolute: 0.1 10*3/uL (ref 0.0–0.1)
Basophils Relative: 0 %
Eosinophils Absolute: 0.4 10*3/uL (ref 0.0–0.5)
Eosinophils Relative: 2 %
HCT: 35.8 % — ABNORMAL LOW (ref 36.0–46.0)
Hemoglobin: 11.9 g/dL — ABNORMAL LOW (ref 12.0–15.0)
Immature Granulocytes: 0 %
Lymphocytes Relative: 72 %
Lymphs Abs: 15.8 10*3/uL — ABNORMAL HIGH (ref 0.7–4.0)
MCH: 33.4 pg (ref 26.0–34.0)
MCHC: 33.2 g/dL (ref 30.0–36.0)
MCV: 100.6 fL — ABNORMAL HIGH (ref 80.0–100.0)
Monocytes Absolute: 2.4 10*3/uL — ABNORMAL HIGH (ref 0.1–1.0)
Monocytes Relative: 11 %
Neutro Abs: 3.4 10*3/uL (ref 1.7–7.7)
Neutrophils Relative %: 15 %
Platelets: 141 10*3/uL — ABNORMAL LOW (ref 150–400)
RBC: 3.56 MIL/uL — ABNORMAL LOW (ref 3.87–5.11)
RDW: 13.2 % (ref 11.5–15.5)
Smear Review: NORMAL
WBC: 22.1 10*3/uL — ABNORMAL HIGH (ref 4.0–10.5)
nRBC: 0 % (ref 0.0–0.2)

## 2021-05-05 LAB — HEPATITIS C ANTIBODY: HCV Ab: NONREACTIVE

## 2021-05-05 LAB — CMP (CANCER CENTER ONLY)
ALT: 18 U/L (ref 0–44)
AST: 22 U/L (ref 15–41)
Albumin: 4.2 g/dL (ref 3.5–5.0)
Alkaline Phosphatase: 88 U/L (ref 38–126)
Anion gap: 6 (ref 5–15)
BUN: 13 mg/dL (ref 8–23)
CO2: 26 mmol/L (ref 22–32)
Calcium: 10.1 mg/dL (ref 8.9–10.3)
Chloride: 99 mmol/L (ref 98–111)
Creatinine: 0.81 mg/dL (ref 0.44–1.00)
GFR, Estimated: >60 mL/min (ref >60)
Glucose, Bld: 109 mg/dL — ABNORMAL HIGH (ref 70–99)
Potassium: 4.4 mmol/L (ref 3.5–5.1)
Sodium: 131 mmol/L — ABNORMAL LOW (ref 135–145)
Total Bilirubin: 0.6 mg/dL (ref 0.3–1.2)
Total Protein: 6.3 g/dL — ABNORMAL LOW (ref 6.5–8.1)

## 2021-05-05 LAB — HEPATITIS B SURFACE ANTIGEN: Hepatitis B Surface Ag: NONREACTIVE

## 2021-05-05 LAB — HEPATITIS B CORE ANTIBODY, TOTAL: Hep B Core Total Ab: NONREACTIVE

## 2021-05-05 LAB — LACTATE DEHYDROGENASE: LDH: 123 U/L (ref 98–192)

## 2021-05-05 NOTE — Progress Notes (Addendum)
[name] Kitchen   HEMATOLOGY/ONCOLOGY CONSULTATION NOTE  Date of Service: 05/05/2021  Patient Care Team: [name], MD as PCP - General (Family Medicine) Ladene Artist, MD as Consulting Physician (Gastroenterology) Aloha Gell, MD as Consulting Physician (Obstetrics and Gynecology) Luberta Mutter, MD as Consulting Physician (Ophthalmology) [name], [facility] as Pharmacist (Pharmacist)  CHIEF COMPLAINTS/PURPOSE OF CONSULTATION:  lymphocytosis  HISTORY OF PRESENTING ILLNESS:  Rebecca [name] is a wonderful 88 y.o. female who has been referred to [place] by Dr Leighton Ruff MD for evaluation and management of lymphocytosis.  Patient has a history of hypertension, GERD, hypothyroidism was noted to have biliary colic in November 6063 and referred for surgical evaluation due to persistent and worsening symptoms.  In the emergency room the patient was noted to have elevated LFTs and significant leukocytosis of 27.4k.  CT scan showed cholelithiasis, ultrasound abdomen showed cholelithiasis without acute cholecystitis.  MRCP showed distended CBD with choledocholithiasis.  Patient subsequently had ERCP on 12/24 with biliary sphincterotomy and subsequently had her laparoscopic cholecystectomy on 03/28/2021.  Surgical pathology from her cholecystectomy showed chronic cholecystitis without cholelithiasis. 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 Labs ordered 04/09/2021 showed persistent leukocytosis of 19.2k with lymphocyte count of 15.2k hemoglobin of 11.1 and platelets of 219k.  Patient was referred to [place] for further evaluation of CD5 positive lymphoproliferative disorder.  Patient notes no fevers no chills no night sweats. No new lumps or bumps. CT abdomen 04/15/2020 showed enlarged abdominal pelvic lymph nodes.   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[place])    Tubular adenoma of colon 05/2009    SURGICAL HISTORY: Past Surgical History:  Procedure Laterality Date   APPENDECTOMY     ARCUATE KERATECTOMY     BILIARY DILATION  03/27/2021   Procedure: BILIARY DILATION;  Surgeon: [name], MD;  Location: [facility];  Service: Gastroenterology;;   CATARACT EXTRACTION, BILATERAL  2015   CHOLECYSTECTOMY N/A 03/28/2021   Procedure: LAPAROSCOPIC CHOLECYSTECTOMY;  Surgeon: Leighton Ruff, MD;  Location: [facility];  Service: General;  Laterality: N/A;   double pallital tori bone removed      ERCP N/A 03/27/2021   Procedure: ENDOSCOPIC RETROGRADE CHOLANGIOPANCREATOGRAPHY (ERCP);  Surgeon: [name], MD;  Location: [facility];  Service: Gastroenterology;  Laterality: N/A;   HERNIA REPAIR     umbilical   KNEE SURGERY Bilateral    morton's neuroma on rt foot and platar facial release     MOUTH SURGERY     "bone shaved off roof of mouth"   REMOVAL OF STONES  03/27/2021   Procedure: REMOVAL OF STONES;  Surgeon: [name], MD;  Location: [facility];  Service: Gastroenterology;;   SALIVARY GLAND SURGERY     removal   SPHINCTEROTOMY  03/27/2021   Procedure: SPHINCTEROTOMY;  Surgeon: [name], MD;  Location: [facility];  Service: Gastroenterology;;   TONSILLECTOMY      SOCIAL HISTORY: Social History   Socioeconomic History   Marital status: Widowed    Spouse name: Not on file   Number of children: 3  Years of education: Not on file   Highest education level: Not on file  Occupational History   Occupation: Glass blower/designer    Comment: retired  Tobacco Use   Smoking status: Never   Smokeless tobacco: Never  Vaping Use   Vaping Use: Never used  Substance and Sexual Activity    Alcohol use: Yes    Alcohol/week: 0.0 standard drinks    Comment: rare   Drug use: No   Sexual activity: Yes  Other Topics Concern   Not on file  Social History Narrative   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   Lives alone on [address], 3 children, 7 grandchildren, 2 Designer, industrial/product, all local and supportive   Attends church   Enjoys reading, especially mysteries   Walks for exercise                     Social Determinants of Radio broadcast assistant Strain: Not on file  Food Insecurity: Not on file  Transportation Needs: Not on file  Physical Activity: Not on file  Stress: Not on file  Social Connections: Not on file  Intimate Partner Violence: Not on file    FAMILY HISTORY: Family History  Problem Relation Age of Onset   Diabetes Mother    Heart disease Mother    Hyperlipidemia Mother    Hypertension Mother    Anuerysm Father    Heart disease Father    Hypertension Father    Migraines Daughter    Kidney Stones Daughter    Colon polyps Sister    Lung cancer Sister    Colon polyps Brother    Diabetes Brother    Hyperlipidemia Brother    Hypertension Brother    Colon polyps Daughter    Breast cancer Daughter    Colon cancer Maternal Aunt        Rectal cancer   Cancer Daughter        salivary   Colon cancer Paternal Grandmother        with possible stomach cancer   Heart disease Brother    Crohn's disease Neg Hx    Pancreatic cancer Neg Hx     ALLERGIES:  is allergic to levaquin [levofloxacin], sulfamethoxazole-trimethoprim, lactose intolerance (gi), tramadol hcl, augmentin [amoxicillin-pot clavulanate], ciprofloxacin, pneumococcal vaccine polyvalent, adhesive [tape], and codeine.  MEDICATIONS:  Current Outpatient Medications  Medication Sig Dispense Refill   ALPRAZolam (XANAX) 0.25 MG tablet TAKE 1 TABLET(0.25 MG) BY MOUTH AT BEDTIME AS NEEDED (Patient taking differently: Take 0.25 mg by mouth at bedtime as  needed for sleep.) 20 tablet 1   Calcium Carb-Cholecalciferol (CALCIUM-VITAMIN D) 600-400 MG-UNIT TABS Take 1 tablet by mouth daily.     Cholecalciferol (VITAMIN D3) 2000 UNITS TABS Take 1 tablet by mouth daily.     ciclopirox (LOPROX) 0.77 % cream Apply topically 2 (two) times daily. 15 g 1   diclofenac sodium (VOLTAREN) 1 % GEL Apply 2 g topically 4 (four) times daily. 1 Tube 1   esomeprazole (NEXIUM) 20 MG capsule Take 20 mg by mouth 2 (two) times daily before a meal.     latanoprost (XALATAN) 0.005 % ophthalmic solution Place 1 drop into both eyes at bedtime.     levothyroxine (SYNTHROID) 88 MCG tablet TAKE 1 TABLET(88 MCG) BY MOUTH DAILY (Patient taking differently: Take 88 mcg by mouth daily before breakfast.) 90 tablet 1   loperamide (IMODIUM) 2 MG capsule Take 2 capsules (4 mg total) by mouth  every 6 (six) hours as needed for diarrhea or loose stools. 30 capsule 0   losartan (COZAAR) 100 MG tablet TAKE 1 TABLET BY MOUTH EVERY DAY (Patient taking differently: Take 100 mg by mouth daily.) 90 tablet 1   metoCLOPramide (REGLAN) 5 MG tablet TAKE 1 TABLET BY MOUTH TWICE DAILY WITH MEALS 60 tablet 3   nystatin (MYCOSTATIN) 100000 UNIT/ML suspension Take 5 mLs by mouth every 4 (four) hours.     ondansetron (ZOFRAN-ODT) 4 MG disintegrating tablet DISSOLVE 1 TABLET(4 MG) ON THE TONGUE EVERY 8 HOURS AS NEEDED FOR NAUSEA OR VOMITING (Patient taking differently: Take 4 mg by mouth every 8 (eight) hours as needed for nausea or vomiting.) 12 tablet 0   rosuvastatin (CRESTOR) 20 MG tablet TAKE 1 TABLET(20 MG) BY MOUTH DAILY (Patient taking differently: Take 20 mg by mouth daily.) 30 tablet 5   timolol (TIMOPTIC) 0.5 % ophthalmic solution Place 1 drop into both eyes 2 (two) times daily.     vitamin B-12 (CYANOCOBALAMIN) 500 MCG tablet Take 1,000 mcg by mouth every morning.     vitamin C (ASCORBIC ACID) 500 MG tablet Take 500 mg by mouth daily.     No current facility-administered medications for this visit.     REVIEW OF SYSTEMS:    10 Point review of Systems was done is negative except as noted above.  PHYSICAL EXAMINATION: ECOG PERFORMANCE STATUS: 1 - Symptomatic but completely ambulatory  . Vitals:   05/05/21 1110  BP: (!) 152/64  Pulse: (!) 50  Resp: 18  Temp: (!) 97.5 F (36.4 C)  SpO2: 100%   Filed Weights   05/05/21 1110  Weight: 111 lb 4.8 oz (50.5 kg)   .Body mass index is 20.36 kg/m.  GENERAL:alert, in no acute distress and comfortable SKIN: no acute rashes, no significant lesions EYES: conjunctiva are pink and non-injected, sclera anicteric OROPHARYNX: MMM, no exudates, no oropharyngeal erythema or ulceration NECK: supple, no JVD LYMPH:  no palpable lymphadenopathy in the cervical, axillary or inguinal regions LUNGS: clear to auscultation b/l with normal respiratory effort HEART: regular rate & rhythm ABDOMEN:  normoactive bowel sounds , non tender, not distended.  We will hepatosplenomegaly Extremity: no pedal edema PSYCH: alert & oriented x 3 with fluent speech NEURO: no focal motor/sensory deficits  LABORATORY DATA:  I have reviewed the data as listed  . CBC Latest Ref Rng & Units 04/09/2021 04/01/2021 03/31/2021  WBC 4.0 - 10.5 K/uL 19.2 Repeated and verified X2.([place]) 21.7(H) 25.6(H)  Hemoglobin 12.0 - 15.0 g/dL 11.1(L) 9.9(L) 10.9(L)  Hematocrit 36.0 - 46.0 % 33.4(L) 29.4(L) 32.4(L)  Platelets 150.0 - 400.0 K/uL 219.0 184 185   Component     Latest Ref Rng & Units 04/09/2021  WBC     4.0 - 10.5 K/uL 19.2 Repeated and verified X2. (HH)  RBC     3.87 - 5.11 Mil/uL 3.35 (L)  Hemoglobin     12.0 - 15.0 g/dL 11.1 (L)  HCT     36.0 - 46.0 % 33.4 (L)  MCV     78.0 - 100.0 fl 99.9  MCHC     30.0 - 36.0 g/dL 33.3  RDW     11.5 - 15.5 % 14.2  Platelets     150.0 - 400.0 K/uL 219.0  Neutrophils     43.0 - 77.0 % 17.1 (L)  Lymphocytes     12.0 - 46.0 % 78.9 (H)  Monocytes Relative     3.0 - 12.0 % 2.8 (L)  Eosinophil     0.0 - 5.0 % 1.0   Basophil     0.0 - 3.0 % 0.2  NEUT#     1.4 - 7.7 K/uL 3.3  Lymphocyte #     0.7 - 4.0 K/uL 15.2 (H)  Monocyte #     0.1 - 1.0 K/uL 0.5  Eosinophils Absolute     0.0 - 0.7 K/uL 0.2  Basophils Absolute     0.0 - 0.1 K/uL 0.0   . CMP Latest Ref Rng & Units 04/09/2021 04/01/2021 03/31/2021  Glucose 70 - 99 mg/dL 96 89 90  BUN 6 - 23 mg/dL 6 <5(L) 6(L)  Creatinine 0.40 - 1.20 mg/dL 0.76 0.83 0.89  Sodium 135 - 145 mEq/L 135 132(L) 130(L)  Potassium 3.5 - 5.1 mEq/L 4.1 3.3(L) 3.5  Chloride 96 - 112 mEq/L 101 101 99  CO2 19 - 32 mEq/L 28 26 24   Calcium 8.4 - 10.5 mg/dL 10.1 8.7(L) 9.1  Total Protein 6.0 - 8.3 g/dL 6.3 - 5.3(L)  Total Bilirubin 0.2 - 1.2 mg/dL 0.7 - 1.0  Alkaline Phos 39 - 117 U/L 81 - 82  AST 0 - 37 U/L 23 - 26  ALT 0 - 35 U/L 18 - 47(H)   PATHOLOGY SURGICAL PATHOLOGY   THIS IS AN ADDENDUM REPORT   CASE: [id number]  PATIENT: Rebecca [name]  Surgical Pathology Report  Addendum   Reason for Addendum #1:  Immunohistochemistry results   Clinical History: Acute Cholecystitis ([facility])      FINAL MICROSCOPIC DIAGNOSIS:   A. GALLBLADDER, CHOLECYSTECTOMY:  - Chronic cholecystitis, without cholelithiasis.  - Atypical CD5+ B cell infiltrate, most suggestive of involvement by  CLL/SLL.   COMMENT:   A. Given the presence of marked lymphocytic inflammation,  immunohistochemical studies were performed. Studies show a significant  predominance of CD20+ B cells, with aberrant co-expression of CD5 and  CD23. There are relatively fewer background CD3+ T cells. The features  are most suggestive of involvement by CLL/SLL. An immunohistochemical  study for Cyclin-D1 is pending, and will be reported as an addendum.  Recommend correlation with peripheral blood findings and evaluation for  other potential sites of adenopathy.     RADIOGRAPHIC STUDIES: I have personally reviewed the radiological images as listed and agreed with the findings in the report. No results  found.  ASSESSMENT & PLAN:   88 year old female with recent laparoscopic cholecystectomy noted to have  1) absolute lymphocytosis on peripheral blood 2) CD5 positive lymphoid infiltrate in gallbladder pathology suggestive of involvement by CLL/SLL PLAN -Patient's hospital and outside records reviewed in detail. -Discussed pathology results from her gallbladder sample which showed CD5 positive lymphoid infiltrate of the gallbladder consistent with CLL/SLL involvement of gallbladder. -She has obvious absolute lymphocytosis. -We discussed that the differential of her presentation is either likely CLL/SLL or less likely mantle cell lymphoma -We discussed work-up indicated.  We will send peripheral blood flow cytometry and CLL FISH panel to evaluate her condition further. -Peripheral blood for FISH for t(11;14) to rule out mantle cell lymphoma. -Hepatitis profile -Further work-up including imaging based on initial lab results. -I discussed likely diagnosis of CLL/SLL, natural history, work-up, treatment considerations etc. with the patient in details.  3) . Patient Active Problem List   Diagnosis Date Noted   Malnutrition of moderate degree 03/27/2021   Choledocholithiasis 03/26/2021   Cholangitis 03/26/2021   Cholelithiasis 03/25/2021   Elevated LFTs 03/25/2021   Chronic hyponatremia 03/25/2021   Bilateral primary osteoarthritis of knee 06/11/2019  Pain in left shoulder 05/22/2019   Unspecified vitamin D deficiency 12/25/2013   Atherosclerosis 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 04/20/2011   SPINAL STENOSIS, LUMBAR 03/09/2010   OTHER OSTEOPOROSIS 03/09/2010   SCOLIOSIS, LUMBAR SPINE 03/09/2010   MIGRAINE, OPHTHALMIC 10/29/2009   LUMBAR RADICULOPATHY, ATYPICAL 10/29/2009   POLYNEUROPATHY OTHER DISEASES CLASSIFIED ELSW 09/22/2009   UNSPECIFIED PERIPHERAL VASCULAR DISEASE 09/22/2009   ADENOMATOUS COLONIC POLYP 06/10/2009   History of iron deficiency  05/06/2009   CAROTID ARTERY DISEASE 07/28/2008   Allergic rhinitis 03/20/2008   DYSPHAGIA 03/12/2008   GANGLION OF TENDON SHEATH 08/17/2007   Hypothyroidism 11/15/2006   Hyperlipidemia 11/07/2006   ADVEF, DRUG/MEDICINAL/BIOLOGICAL SUBST NOS 11/07/2006   Essential hypertension 10/18/2006   GERD 10/18/2006   DIVERTICULOSIS, COLON 10/18/2006   -continue f/u with PCP for mx of other chronic medical comorbidities.  . Orders Placed This Encounter  Procedures   CBC with Differential/Platelet    Standing Status:   Future    Number of Occurrences:   1    Standing Expiration Date:   05/05/2022   CMP ([place] only)    Standing Status:   Future    Number of Occurrences:   1    Standing Expiration Date:   05/05/2022   Lactate dehydrogenase    Standing Status:   Future    Number of Occurrences:   1    Standing Expiration Date:   05/05/2022   Flow Cytometry, Peripheral Blood (Oncology)    Persistent lymphocytosis likely CLL    Standing Status:   Future    Number of Occurrences:   1    Standing Expiration Date:   05/05/2022   Hepatitis C antibody    Standing Status:   Future    Number of Occurrences:   1    Standing Expiration Date:   05/05/2022   Hepatitis B surface antigen    Standing Status:   Future    Number of Occurrences:   1    Standing Expiration Date:   05/05/2022   Hepatitis B core antibody, total    Standing Status:   Future    Number of Occurrences:   1    Standing Expiration Date:   05/05/2022   FISH, CLL Prognostic Panel    Standing Status:   Future    Number of Occurrences:   1    Standing Expiration Date:   05/05/2022     All of the patients questions were answered with apparent satisfaction. The patient knows to call the clinic with any problems, questions or concerns.  I spent  40 mins counseling the patient face to face. The total time spent in the appointment was 35 mins*   Sullivan Lone MD MS AAHIVMS [facility] Hematology/Oncology Physician [place]  (Office):       [phone] (Work cell):  [phone] (Fax):           [phone]  05/05/2021 11:36 AM  .*Total Encounter Time as defined by the Centers for Medicare and Medicaid Services includes, in addition to the face-to-face time of a patient visit (documented in the note above) non-face-to-face time: obtaining and reviewing outside history, ordering and reviewing medications, tests or procedures, care coordination (communications with other health care professionals or caregivers) and documentation in the medical record.

## 2021-05-07 LAB — SURGICAL PATHOLOGY

## 2021-05-10 LAB — FLOW CYTOMETRY

## 2021-05-11 DIAGNOSIS — Z1231 Encounter for screening mammogram for malignant neoplasm of breast: Secondary | ICD-10-CM | POA: Diagnosis not present

## 2021-05-11 DIAGNOSIS — Z4689 Encounter for fitting and adjustment of other specified devices: Secondary | ICD-10-CM | POA: Diagnosis not present

## 2021-05-11 DIAGNOSIS — N816 Rectocele: Secondary | ICD-10-CM | POA: Diagnosis not present

## 2021-05-11 DIAGNOSIS — N8110 Cystocele, unspecified: Secondary | ICD-10-CM | POA: Diagnosis not present

## 2021-05-12 LAB — FISH,CLL PROGNOSTIC PANEL

## 2021-05-13 LAB — LYMPHOMA-MANTLE CELL

## 2021-05-14 DIAGNOSIS — Z20822 Contact with and (suspected) exposure to covid-19: Secondary | ICD-10-CM | POA: Diagnosis not present

## 2021-05-18 ENCOUNTER — Ambulatory Visit (INDEPENDENT_AMBULATORY_CARE_PROVIDER_SITE_OTHER): Payer: Medicare Other | Admitting: Family Medicine

## 2021-05-18 VITALS — BP 120/60 | HR 55 | Temp 98.2°F | Wt 111.1 lb

## 2021-05-18 DIAGNOSIS — J019 Acute sinusitis, unspecified: Secondary | ICD-10-CM

## 2021-05-18 MED ORDER — AZITHROMYCIN 250 MG PO TABS: ORAL_TABLET | ORAL | 0 refills | Status: AC

## 2021-05-18 NOTE — Progress Notes (Signed)
Established Patient Office Visit  Subjective:  Patient ID: Rebecca [name], female    DOB: 1932/09/28  Age: 88 y.o. MRN: [id number]  CC:  Chief Complaint  Patient presents with   Cough    Productive cough. Drainage, nasal congestion, x 1 week    HPI Rebecca [name] presents for 1 week history of postnasal drainage and sinus pressure especially ethmoid and maxillary sinuses.  She has a productive cough.  Increased malaise.  No fever.  Symptoms started over a week ago.  She has noticed some thick yellow crusted bloody drainage at times.  Multiple allergies but has tolerated Zithromax in the past.  She has had history of difficult to shake sinus infections in the past.  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[place])    Tubular adenoma of colon 05/2009    Past Surgical History:  Procedure Laterality Date   APPENDECTOMY     ARCUATE KERATECTOMY     BILIARY DILATION  03/27/2021   Procedure: BILIARY DILATION;  Surgeon: [name], MD;  Location: [facility];  Service: Gastroenterology;;   CATARACT EXTRACTION, BILATERAL  2015   CHOLECYSTECTOMY N/A 03/28/2021   Procedure: LAPAROSCOPIC CHOLECYSTECTOMY;  Surgeon: Leighton Ruff, MD;  Location: [facility];  Service: General;  Laterality: N/A;   double pallital tori bone removed      ERCP N/A 03/27/2021   Procedure: ENDOSCOPIC RETROGRADE CHOLANGIOPANCREATOGRAPHY (ERCP);  Surgeon: [name], MD;  Location: [facility];  Service: Gastroenterology;  Laterality: N/A;   HERNIA REPAIR     umbilical   KNEE SURGERY Bilateral    morton's neuroma on rt foot and platar facial release     MOUTH SURGERY     "bone shaved off roof of mouth"    REMOVAL OF STONES  03/27/2021   Procedure: REMOVAL OF STONES;  Surgeon: [name], MD;  Location: [facility];  Service: Gastroenterology;;   SALIVARY GLAND SURGERY     removal   SPHINCTEROTOMY  03/27/2021   Procedure: SPHINCTEROTOMY;  Surgeon: [name], MD;  Location: [facility];  Service: Gastroenterology;;   TONSILLECTOMY      Family History  Problem Relation Age of Onset   Diabetes Mother    Heart disease Mother    Hyperlipidemia Mother    Hypertension Mother    Anuerysm Father    Heart disease Father    Hypertension Father    Migraines Daughter    Kidney Stones Daughter    Colon polyps Sister    Lung cancer Sister    Colon polyps Brother    Diabetes Brother    Hyperlipidemia Brother    Hypertension Brother    Colon polyps Daughter    Breast cancer Daughter    Colon cancer Maternal Aunt        Rectal cancer   Cancer Daughter        salivary   Colon cancer Paternal Grandmother        with possible stomach cancer   Heart disease Brother    Crohn's disease Neg Hx    Pancreatic cancer Neg Hx  Social History   Socioeconomic History   Marital status: Widowed    Spouse name: Not on file   Number of children: 3   Years of education: Not on file   Highest education level: Not on file  Occupational History   Occupation: Glass blower/designer    Comment: retired  Tobacco Use   Smoking status: Never   Smokeless tobacco: Never  Vaping Use   Vaping Use: Never used  Substance and Sexual Activity   Alcohol use: Yes    Alcohol/week: 0.0 standard drinks    Comment: rare   Drug use: No   Sexual activity: Yes  Other Topics Concern   Not on file  Social History Narrative   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   Lives alone on [address], 3 children, 7 grandchildren, 2 Designer, industrial/product, all local and supportive   Attends church   Enjoys reading, especially mysteries   Walks for exercise                      Social Determinants of Radio broadcast assistant Strain: Not on file  Food Insecurity: Not on file  Transportation Needs: Not on file  Physical Activity: Not on file  Stress: Not on file  Social Connections: Not on file  Intimate Partner Violence: Not on file    Outpatient Medications Prior to Visit  Medication Sig Dispense Refill   ALPRAZolam (XANAX) 0.25 MG tablet TAKE 1 TABLET(0.25 MG) BY MOUTH AT BEDTIME AS NEEDED (Patient taking differently: Take 0.25 mg by mouth at bedtime as needed for sleep.) 20 tablet 1   Calcium Carb-Cholecalciferol (CALCIUM-VITAMIN D) 600-400 MG-UNIT TABS Take 1 tablet by mouth daily.     Cholecalciferol (VITAMIN D3) 2000 UNITS TABS Take 1 tablet by mouth daily.     ciclopirox (LOPROX) 0.77 % cream Apply topically 2 (two) times daily. 15 g 1   diclofenac sodium (VOLTAREN) 1 % GEL Apply 2 g topically 4 (four) times daily. 1 Tube 1   esomeprazole (NEXIUM) 20 MG capsule Take 20 mg by mouth 2 (two) times daily before a meal.     latanoprost (XALATAN) 0.005 % ophthalmic solution Place 1 drop into both eyes at bedtime.     levothyroxine (SYNTHROID) 88 MCG tablet TAKE 1 TABLET(88 MCG) BY MOUTH DAILY (Patient taking differently: Take 88 mcg by mouth daily before breakfast.) 90 tablet 1   loperamide (IMODIUM) 2 MG capsule Take 2 capsules (4 mg total) by mouth every 6 (six) hours as needed for diarrhea or loose stools. 30 capsule 0   losartan (COZAAR) 100 MG tablet TAKE 1 TABLET BY MOUTH EVERY DAY (Patient taking differently: Take 100 mg by mouth daily.) 90 tablet 1   metoCLOPramide (REGLAN) 5 MG tablet TAKE 1 TABLET BY MOUTH TWICE DAILY WITH MEALS 60 tablet 3   nystatin (MYCOSTATIN) 100000 UNIT/ML suspension Take 5 mLs by mouth every 4 (four) hours.     ondansetron (ZOFRAN-ODT) 4 MG disintegrating tablet DISSOLVE 1 TABLET(4 MG) ON THE TONGUE EVERY 8 HOURS AS NEEDED FOR NAUSEA OR VOMITING (Patient taking differently: Take 4 mg by mouth every 8 (eight) hours as needed  for nausea or vomiting.) 12 tablet 0   rosuvastatin (CRESTOR) 20 MG tablet TAKE 1 TABLET(20 MG) BY MOUTH DAILY (Patient taking differently: Take 20 mg by mouth daily.) 30 tablet 5   timolol (TIMOPTIC) 0.5 % ophthalmic solution Place 1 drop into both eyes 2 (two) times daily.  vitamin B-12 (CYANOCOBALAMIN) 500 MCG tablet Take 1,000 mcg by mouth every morning.     vitamin C (ASCORBIC ACID) 500 MG tablet Take 500 mg by mouth daily.     No facility-administered medications prior to visit.    Allergies  Allergen Reactions   Levaquin [Levofloxacin] Other (See Comments)    Very nauseous and shakey   Sulfamethoxazole-Trimethoprim Nausea Only    shakey   Lactose Intolerance (Gi) Diarrhea and Other (See Comments)    Stomach hurts    Tramadol Hcl Nausea Only and Palpitations   Augmentin [Amoxicillin-Pot Clavulanate] Diarrhea   Ciprofloxacin Nausea Only    REACTION: Thrush, Nausea, Shakey   Pneumococcal Vaccine Polyvalent     REACTION: RED AND RAISED RASH ON ARM   Adhesive [Tape] Rash   Codeine Nausea Only    ROS Review of Systems  Constitutional:  Negative for chills and fever.  HENT:  Positive for congestion, sinus pressure and sinus pain.   Respiratory:  Negative for shortness of breath.      Objective:    Physical Exam Vitals reviewed.  Cardiovascular:     Rate and Rhythm: Normal rate and regular rhythm.  Pulmonary:     Effort: Pulmonary effort is normal.     Breath sounds: Normal breath sounds.  Musculoskeletal:     Cervical back: Neck supple.  Neurological:     Mental Status: She is alert.    BP 120/60 (BP Location: Left Arm, Patient Position: Sitting, Cuff Size: Normal)    Pulse (!) 55    Temp 98.2 F (36.8 C) (Oral)    Wt 111 lb 1.6 oz (50.4 kg)    SpO2 93%    BMI 20.32 kg/m  Wt Readings from Last 3 Encounters:  05/18/21 111 lb 1.6 oz (50.4 kg)  05/05/21 111 lb 4.8 oz (50.5 kg)  04/26/21 110 lb (49.9 kg)     Health Maintenance Due  Topic Date Due    TETANUS/TDAP  07/29/2018    There are no preventive care reminders to display for this patient.  Lab Results  Component Value Date   TSH 0.766 03/26/2021   Lab Results  Component Value Date   WBC 22.1 (H) 05/05/2021   HGB 11.9 (L) 05/05/2021   HCT 35.8 (L) 05/05/2021   MCV 100.6 (H) 05/05/2021   PLT 141 (L) 05/05/2021   Lab Results  Component Value Date   NA 131 (L) 05/05/2021   K 4.4 05/05/2021   CO2 26 05/05/2021   GLUCOSE 109 (H) 05/05/2021   BUN 13 05/05/2021   CREATININE 0.81 05/05/2021   BILITOT 0.6 05/05/2021   ALKPHOS 88 05/05/2021   AST 22 05/05/2021   ALT 18 05/05/2021   PROT 6.3 (L) 05/05/2021   ALBUMIN 4.2 05/05/2021   CALCIUM 10.1 05/05/2021   ANIONGAP 6 05/05/2021   GFR 71.09 04/09/2021   Lab Results  Component Value Date   CHOL 132 08/21/2020   Lab Results  Component Value Date   HDL 39.20 08/21/2020   Lab Results  Component Value Date   LDLCALC 55 08/21/2020   Lab Results  Component Value Date   TRIG 188.0 (H) 08/21/2020   Lab Results  Component Value Date   CHOLHDL 3 08/21/2020   Lab Results  Component Value Date   HGBA1C 5.6 07/26/2006      Assessment & Plan:   Acute sinusitis symptoms.  Start Zithromax for 5 days.  Plenty fluids and rest.  Consider over-the-counter plain Mucinex.  Follow-up for any  persistent or worsening symptoms.  Meds ordered this encounter  Medications   azithromycin (ZITHROMAX Z-PAK) 250 MG tablet    Sig: Take 2 tablets (500 mg) on  Day 1,  followed by 1 tablet (250 mg) once daily on Days 2 through 5.    Dispense:  6 each    Refill:  0    Follow-up: No follow-ups on file.    Carolann Littler, MD

## 2021-05-21 ENCOUNTER — Other Ambulatory Visit: Payer: Self-pay

## 2021-05-21 ENCOUNTER — Inpatient Hospital Stay (HOSPITAL_BASED_OUTPATIENT_CLINIC_OR_DEPARTMENT_OTHER): Payer: Medicare Other | Admitting: Hematology

## 2021-05-21 DIAGNOSIS — C9110 Chronic lymphocytic leukemia of B-cell type not having achieved remission: Secondary | ICD-10-CM | POA: Diagnosis not present

## 2021-05-21 NOTE — Progress Notes (Signed)
[name] Kitchen   HEMATOLOGY/ONCOLOGY CLINIC NOTE  Date of Service: 05/26/2021  Patient Care Team: [name], MD as PCP - General (Family Medicine) Ladene Artist, MD as Consulting Physician (Gastroenterology) Aloha Gell, MD as Consulting Physician (Obstetrics and Gynecology) Luberta Mutter, MD as Consulting Physician (Ophthalmology) [name], [facility] as Pharmacist (Pharmacist)  CHIEF COMPLAINTS/PURPOSE OF CONSULTATION:  Follow-up for newly diagnosed CLL  HISTORY OF PRESENTING ILLNESS:   Rebecca [name] is a wonderful 88 y.o. female who has been referred to [place] by Dr Leighton Ruff MD for evaluation and management of lymphocytosis.  Patient has a history of hypertension, GERD, hypothyroidism was noted to have biliary colic in November 7741 and referred for surgical evaluation due to persistent and worsening symptoms.  In the emergency room the patient was noted to have elevated LFTs and significant leukocytosis of 27.4k.  CT scan showed cholelithiasis, ultrasound abdomen showed cholelithiasis without acute cholecystitis.  MRCP showed distended CBD with choledocholithiasis.  Patient subsequently had ERCP on 12/24 with biliary sphincterotomy and subsequently had her laparoscopic cholecystectomy on 03/28/2021.  Surgical pathology from her cholecystectomy showed chronic cholecystitis without cholelithiasis. 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 Labs ordered 04/09/2021 showed persistent leukocytosis of 19.2k with lymphocyte count of 15.2k hemoglobin of 11.1 and platelets of 219k.  Patient was referred to [place] for further evaluation of CD5 positive lymphoproliferative disorder.  Patient notes no fevers no chills no night sweats. No new lumps or bumps. CT abdomen 04/15/2020 showed enlarged abdominal pelvic lymph nodes.  INTERVAL HISTORY  ..I connected with BRITTYN SALAZ on 05/21/2021  at 11:20 AM EST by telephone visit and  verified that I am speaking with the correct person using two identifiers.   I discussed the limitations, risks, security and privacy concerns of performing an evaluation and management service by telemedicine and the availability of in-person appointments. I also discussed with the patient that there may be a patient responsible charge related to this service. The patient expressed understanding and agreed to proceed.   Patients location: Home Providers location: [place]  Chief Complaint: Review of lab results for newly diagnosed CLL.  Patient notes no new symptoms since her last clinic visit. We discussed that her labs confirm diagnosis of CLL and ruled out mantle cell lymphoma based on FISH studies. Her prognostic markers were discussed. All other labs were discussed in detail as well and the patient's questions were answered.  MEDICAL HISTORY:  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[place])    Tubular adenoma of colon 05/2009    SURGICAL HISTORY: Past Surgical History:  Procedure Laterality Date   APPENDECTOMY     ARCUATE KERATECTOMY     BILIARY DILATION  03/27/2021   Procedure: BILIARY DILATION;  Surgeon: [name], MD;  Location: [facility];  Service: Gastroenterology;;   CATARACT EXTRACTION, BILATERAL  2015   CHOLECYSTECTOMY N/A 03/28/2021   Procedure: LAPAROSCOPIC CHOLECYSTECTOMY;  Surgeon: Leighton Ruff, MD;  Location: [facility];  Service: General;  Laterality: N/A;   double pallital tori bone removed  ERCP N/A 03/27/2021   Procedure: ENDOSCOPIC RETROGRADE CHOLANGIOPANCREATOGRAPHY (ERCP);  Surgeon: [name], MD;  Location: [facility];  Service:  Gastroenterology;  Laterality: N/A;   HERNIA REPAIR     umbilical   KNEE SURGERY Bilateral    morton's neuroma on rt foot and platar facial release     MOUTH SURGERY     "bone shaved off roof of mouth"   REMOVAL OF STONES  03/27/2021   Procedure: REMOVAL OF STONES;  Surgeon: [name], MD;  Location: [facility];  Service: Gastroenterology;;   SALIVARY GLAND SURGERY     removal   SPHINCTEROTOMY  03/27/2021   Procedure: SPHINCTEROTOMY;  Surgeon: [name], MD;  Location: [facility];  Service: Gastroenterology;;   TONSILLECTOMY      SOCIAL HISTORY: Social History   Socioeconomic History   Marital status: Widowed    Spouse name: Not on file   Number of children: 3   Years of education: Not on file   Highest education level: Not on file  Occupational History   Occupation: Glass blower/designer    Comment: retired  Tobacco Use   Smoking status: Never   Smokeless tobacco: Never  Vaping Use   Vaping Use: Never used  Substance and Sexual Activity   Alcohol use: Yes    Alcohol/week: 0.0 standard drinks    Comment: rare   Drug use: No   Sexual activity: Yes  Other Topics Concern   Not on file  Social History Narrative   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   Lives alone on [address], 3 children, 7 grandchildren, 2 Designer, industrial/product, all local and supportive   Attends church   Enjoys reading, especially mysteries   Walks for exercise                     Social Determinants of Radio broadcast assistant Strain: Not on file  Food Insecurity: Not on file  Transportation Needs: Not on file  Physical Activity: Not on file  Stress: Not on file  Social Connections: Not on file  Intimate Partner Violence: Not on file    FAMILY HISTORY: Family History  Problem Relation Age of Onset   Diabetes Mother    Heart disease Mother    Hyperlipidemia Mother    Hypertension Mother    Anuerysm Father    Heart disease Father     Hypertension Father    Migraines Daughter    Kidney Stones Daughter    Colon polyps Sister    Lung cancer Sister    Colon polyps Brother    Diabetes Brother    Hyperlipidemia Brother    Hypertension Brother    Colon polyps Daughter    Breast cancer Daughter    Colon cancer Maternal Aunt        Rectal cancer   Cancer Daughter        salivary   Colon cancer Paternal Grandmother        with possible stomach cancer   Heart disease Brother    Crohn's disease Neg Hx    Pancreatic cancer Neg Hx     ALLERGIES:  is allergic to levaquin [levofloxacin], sulfamethoxazole-trimethoprim, lactose intolerance (gi), tramadol hcl, augmentin [amoxicillin-pot clavulanate], ciprofloxacin, pneumococcal vaccine polyvalent, adhesive [tape], and codeine.  MEDICATIONS:  Current Outpatient Medications  Medication Sig Dispense Refill   ALPRAZolam (XANAX) 0.25 MG tablet TAKE 1 TABLET(0.25 MG) BY MOUTH AT BEDTIME AS NEEDED (Patient taking differently: Take  0.25 mg by mouth at bedtime as needed for sleep.) 20 tablet 1   Calcium Carb-Cholecalciferol (CALCIUM-VITAMIN D) 600-400 MG-UNIT TABS Take 1 tablet by mouth daily.     Cholecalciferol (VITAMIN D3) 2000 UNITS TABS Take 1 tablet by mouth daily.     ciclopirox (LOPROX) 0.77 % cream Apply topically 2 (two) times daily. 15 g 1   diclofenac sodium (VOLTAREN) 1 % GEL Apply 2 g topically 4 (four) times daily. 1 Tube 1   esomeprazole (NEXIUM) 20 MG capsule Take 20 mg by mouth 2 (two) times daily before a meal.     latanoprost (XALATAN) 0.005 % ophthalmic solution Place 1 drop into both eyes at bedtime.     levothyroxine (SYNTHROID) 88 MCG tablet TAKE 1 TABLET(88 MCG) BY MOUTH DAILY (Patient taking differently: Take 88 mcg by mouth daily before breakfast.) 90 tablet 1   loperamide (IMODIUM) 2 MG capsule Take 2 capsules (4 mg total) by mouth every 6 (six) hours as needed for diarrhea or loose stools. 30 capsule 0   losartan (COZAAR) 100 MG tablet TAKE 1 TABLET BY MOUTH  EVERY DAY (Patient taking differently: Take 100 mg by mouth daily.) 90 tablet 1   metoCLOPramide (REGLAN) 5 MG tablet TAKE 1 TABLET BY MOUTH TWICE DAILY WITH MEALS 60 tablet 3   nystatin (MYCOSTATIN) 100000 UNIT/ML suspension Take 5 mLs by mouth every 4 (four) hours.     ondansetron (ZOFRAN-ODT) 4 MG disintegrating tablet DISSOLVE 1 TABLET(4 MG) ON THE TONGUE EVERY 8 HOURS AS NEEDED FOR NAUSEA OR VOMITING (Patient taking differently: Take 4 mg by mouth every 8 (eight) hours as needed for nausea or vomiting.) 12 tablet 0   rosuvastatin (CRESTOR) 20 MG tablet TAKE 1 TABLET(20 MG) BY MOUTH DAILY (Patient taking differently: Take 20 mg by mouth daily.) 30 tablet 5   timolol (TIMOPTIC) 0.5 % ophthalmic solution Place 1 drop into both eyes 2 (two) times daily.     vitamin B-12 (CYANOCOBALAMIN) 500 MCG tablet Take 1,000 mcg by mouth every morning.     vitamin C (ASCORBIC ACID) 500 MG tablet Take 500 mg by mouth daily.     No current facility-administered medications for this visit.    REVIEW OF SYSTEMS:    10 Point review of Systems was done is negative except as noted above.  PHYSICAL EXAMINATION:  Telemedicine visit  LABORATORY DATA:  I have reviewed the data as listed  CBC Latest Ref Rng & Units 05/05/2021 04/09/2021 04/01/2021  WBC 4.0 - 10.5 K/uL 22.1(H) 19.2 Repeated and verified X2.([place]) 21.7(H)  Hemoglobin 12.0 - 15.0 g/dL 11.9(L) 11.1(L) 9.9(L)  Hematocrit 36.0 - 46.0 % 35.8(L) 33.4(L) 29.4(L)  Platelets 150 - 400 K/uL 141(L) 219.0 184   CMP Latest Ref Rng & Units 05/05/2021 04/09/2021 04/01/2021  Glucose 70 - 99 mg/dL 109(H) 96 89  BUN 8 - 23 mg/dL 13 6 <5(L)  Creatinine 0.44 - 1.00 mg/dL 0.81 0.76 0.83  Sodium 135 - 145 mmol/L 131(L) 135 132(L)  Potassium 3.5 - 5.1 mmol/L 4.4 4.1 3.3(L)  Chloride 98 - 111 mmol/L 99 101 101  CO2 22 - 32 mmol/L 26 28 26   Calcium 8.9 - 10.3 mg/dL 10.1 10.1 8.7(L)  Total Protein 6.5 - 8.1 g/dL 6.3(L) 6.3 -  Total Bilirubin 0.3 - 1.2 mg/dL 0.6 0.7 -   Alkaline Phos 38 - 126 U/L 88 81 -  AST 15 - 41 U/L 22 23 -  ALT 0 - 44 U/L 18 18 -   Component  Latest Ref Rng & Units 05/05/2021  Hep B Core Total Ab     NON REACTIVE NON REACTIVE  Hepatitis B Surface Ag     NON REACTIVE NON REACTIVE  HCV Ab     NON REACTIVE NON REACTIVE  LDH     98 - 192 U/L 123   Surgical Pathology  CASE: [id number]  PATIENT: Ferrah Roston  Flow Pathology Report      Clinical history: CLL    DIAGNOSIS:   -  Monoclonal B-cell population with co-expression of CD5 comprises 80%  of all lymphocytes  -  See comment   COMMENT:   The phenotypic features are consistent with involvement by non-Hodgkin  B-cell lymphoma. Given the presence of CD5 expression, the differential  diagnosis includes chronic lymphocytic leukemia/small lymphocytic  lymphoma and mantle cell lymphoma.   PATHOLOGY SURGICAL PATHOLOGY   THIS IS AN ADDENDUM REPORT   CASE: [id number]  PATIENT: Doree Barthel  Surgical Pathology Report  Addendum   Reason for Addendum #1:  Immunohistochemistry results   Clinical History: Acute Cholecystitis ([facility])      FINAL MICROSCOPIC DIAGNOSIS:   A. GALLBLADDER, CHOLECYSTECTOMY:  - Chronic cholecystitis, without cholelithiasis.  - Atypical CD5+ B cell infiltrate, most suggestive of involvement by  CLL/SLL.   COMMENT:   A. Given the presence of marked lymphocytic inflammation,  immunohistochemical studies were performed. Studies show a significant  predominance of CD20+ B cells, with aberrant co-expression of CD5 and  CD23. There are relatively fewer background CD3+ T cells. The features  are most suggestive of involvement by CLL/SLL. An immunohistochemical  study for Cyclin-D1 is pending, and will be reported as an addendum.  Recommend correlation with peripheral blood findings and evaluation for  other potential sites of adenopathy.     RADIOGRAPHIC STUDIES: I have personally reviewed the radiological images as listed  and agreed with the findings in the report. No results found.  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 2) CD5 positive lymphoid infiltrate in gallbladder pathology suggestive of involvement by CLL/SLL PLAN -Discussed blood FISH results which ruled out mantle cell lymphoma and confirm the diagnosis of CLL. -Patient has no constitutional symptoms -Clinically does not have any significant lymphadenopathy or hepatosplenomegaly. -Blood counts do not show significant anemia hemoglobin is 11.9 platelets 141k -CT abdomen was done recently on 03/25/2021-did not show significant lymphadenopathy or hepatosplenomegaly.  Noted to have some lung nodules in the lower lobes. -We will get CT chest to complete her staging and evaluate the lung nodules prior to her follow-up visit -We discussed in detail her new diagnosis of CLL the natural history, prognosis, prognostic factors including her mutation studies, treatment considerations, principles and logic of treatment versus observation. -At this time the patient is not a candidate to pursue active treatments -We shall see her back in 12 weeks to monitor her blood counts and get a CT chest prior to that 3) .  Basal cell carcinoma on the right side of the nose Seeing Dr. Wilhemina Bonito at [facility] dermatology for management  We discussed that CLL and especially with 13 q. deletion poses a risk factor for recurrent nonmelanoma skin cancers and she should continue follow-up with dermatology for close management. 4) Patient Active Problem List   Diagnosis Date Noted   Malnutrition of moderate degree 03/27/2021   Choledocholithiasis 03/26/2021   Cholangitis 03/26/2021   Cholelithiasis 03/25/2021  Elevated LFTs 03/25/2021   Chronic hyponatremia 03/25/2021   Bilateral primary osteoarthritis of knee 06/11/2019   Pain in left shoulder 05/22/2019    Unspecified vitamin D deficiency 12/25/2013   Atherosclerosis 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 04/20/2011   SPINAL STENOSIS, LUMBAR 03/09/2010   OTHER OSTEOPOROSIS 03/09/2010   SCOLIOSIS, LUMBAR SPINE 03/09/2010   MIGRAINE, OPHTHALMIC 10/29/2009   LUMBAR RADICULOPATHY, ATYPICAL 10/29/2009   POLYNEUROPATHY OTHER DISEASES CLASSIFIED ELSW 09/22/2009   UNSPECIFIED PERIPHERAL VASCULAR DISEASE 09/22/2009   ADENOMATOUS COLONIC POLYP 06/10/2009   History of iron deficiency 05/06/2009   CAROTID ARTERY DISEASE 07/28/2008   Allergic rhinitis 03/20/2008   DYSPHAGIA 03/12/2008   GANGLION OF TENDON SHEATH 08/17/2007   Hypothyroidism 11/15/2006   Hyperlipidemia 11/07/2006   ADVEF, DRUG/MEDICINAL/BIOLOGICAL SUBST NOS 11/07/2006   Essential hypertension 10/18/2006   GERD 10/18/2006   DIVERTICULOSIS, COLON 10/18/2006   -continue f/u with PCP for mx of other chronic medical comorbidities.  Follow-up Ct chest in 11 weeks RTC with Dr Irene Limbo with labs in 12 weeks  Total time spent on this phone encounter 23 minutes  Sullivan Lone MD [place] AAHIVMS [facility] Hematology/Oncology Physician [facility]  .*Total Encounter Time as defined by the Centers for Medicare and Medicaid Services includes, in addition to the face-to-face time of a patient visit (documented in the note above) non-face-to-face time: obtaining and reviewing outside history, ordering and reviewing medications, tests or procedures, care coordination (communications with other health care professionals or caregivers) and documentation in the medical record.

## 2021-05-25 ENCOUNTER — Telehealth: Payer: Self-pay | Admitting: Hematology

## 2021-05-25 NOTE — Telephone Encounter (Signed)
Scheduled follow-up appointment per 2/17 los. Patient is aware. 

## 2021-05-26 ENCOUNTER — Telehealth: Payer: Self-pay | Admitting: Pharmacist

## 2021-05-26 NOTE — Chronic Care Management (AMB) (Signed)
° ° °  Chronic Care Management Pharmacy Assistant   Name: Rebecca [name]  MRN: [id number] DOB: October 15, 1932  05/26/21 APPOINTMENT REMINDER  Patient was reminded to have all medications, supplements and any blood glucose and blood pressure readings available for review with Jeni Salles, Pharm. D, for telephone visit on 05/28/21 at 2.   Care Gaps: TDAP - Overdue BP- 120/60 (05/18/21) AWV- MSG sent to Ramond Craver CMA to schedule.    Star Rating Drug: Losartan 100 mg - Last filled 05/03/21 90 DS at [facility] Rosuvastatin 20 mg - Last filled 05/03/21  Any gaps in medications fill history?    SIG:    Medications: Outpatient Encounter Medications as of 05/26/2021  Medication Sig Note   ALPRAZolam (XANAX) 0.25 MG tablet TAKE 1 TABLET(0.25 MG) BY MOUTH AT BEDTIME AS NEEDED (Patient taking differently: Take 0.25 mg by mouth at bedtime as needed for sleep.)    Calcium Carb-Cholecalciferol (CALCIUM-VITAMIN D) 600-400 MG-UNIT TABS Take 1 tablet by mouth daily.    Cholecalciferol (VITAMIN D3) 2000 UNITS TABS Take 1 tablet by mouth daily.    ciclopirox (LOPROX) 0.77 % cream Apply topically 2 (two) times daily.    diclofenac sodium (VOLTAREN) 1 % GEL Apply 2 g topically 4 (four) times daily.    esomeprazole (NEXIUM) 20 MG capsule Take 20 mg by mouth 2 (two) times daily before a meal.    latanoprost (XALATAN) 0.005 % ophthalmic solution Place 1 drop into both eyes at bedtime.    levothyroxine (SYNTHROID) 88 MCG tablet TAKE 1 TABLET(88 MCG) BY MOUTH DAILY (Patient taking differently: Take 88 mcg by mouth daily before breakfast.)    loperamide (IMODIUM) 2 MG capsule Take 2 capsules (4 mg total) by mouth every 6 (six) hours as needed for diarrhea or loose stools.    losartan (COZAAR) 100 MG tablet TAKE 1 TABLET BY MOUTH EVERY DAY (Patient taking differently: Take 100 mg by mouth daily.)    metoCLOPramide (REGLAN) 5 MG tablet TAKE 1 TABLET BY MOUTH TWICE DAILY WITH MEALS 03/26/2021: 30 day supply  02/19/2021   nystatin (MYCOSTATIN) 100000 UNIT/ML suspension Take 5 mLs by mouth every 4 (four) hours.    ondansetron (ZOFRAN-ODT) 4 MG disintegrating tablet DISSOLVE 1 TABLET(4 MG) ON THE TONGUE EVERY 8 HOURS AS NEEDED FOR NAUSEA OR VOMITING (Patient taking differently: Take 4 mg by mouth every 8 (eight) hours as needed for nausea or vomiting.)    rosuvastatin (CRESTOR) 20 MG tablet TAKE 1 TABLET(20 MG) BY MOUTH DAILY (Patient taking differently: Take 20 mg by mouth daily.)    timolol (TIMOPTIC) 0.5 % ophthalmic solution Place 1 drop into both eyes 2 (two) times daily.    vitamin B-12 (CYANOCOBALAMIN) 500 MCG tablet Take 1,000 mcg by mouth every morning.    vitamin C (ASCORBIC ACID) 500 MG tablet Take 500 mg by mouth daily.    [DISCONTINUED] olmesartan (BENICAR) 20 MG tablet Take 20 mg by mouth daily.      No facility-administered encounter medications on file as of 05/26/2021.     [place] Clinical Pharmacist Assistant [phone]

## 2021-05-27 ENCOUNTER — Telehealth: Payer: Self-pay | Admitting: Family Medicine

## 2021-05-27 NOTE — Telephone Encounter (Signed)
Please find out who her daughter is so Dr. Yong Channel can know.

## 2021-05-27 NOTE — Telephone Encounter (Signed)
Pt would like to become a pt of Dr Yong Channel. She is currently a pt of [facility]. Her daughter is a pt of [facility]. I am not sending a msg to her PCP until I hear from Dr Yong Channel. Please advise

## 2021-05-28 ENCOUNTER — Ambulatory Visit (INDEPENDENT_AMBULATORY_CARE_PROVIDER_SITE_OTHER): Payer: Medicare Other | Admitting: Pharmacist

## 2021-05-28 DIAGNOSIS — I10 Essential (primary) hypertension: Secondary | ICD-10-CM

## 2021-05-28 DIAGNOSIS — E785 Hyperlipidemia, unspecified: Secondary | ICD-10-CM

## 2021-05-28 NOTE — Telephone Encounter (Signed)
Pt would like to become a pt of Dr Yong Channel due to her daughter already being a pt.

## 2021-05-28 NOTE — Telephone Encounter (Signed)
Pt's daughter is Neoma Laming A. Shelton.

## 2021-05-28 NOTE — Progress Notes (Signed)
Chronic Care Management Pharmacy Note  06/01/2021 Name:  Rebecca [name] MRN:  [id number] DOB:  07/29/1932  Summary: BP at goal < 140/90 per office readings LDL at goal < 70  Recommendations/Changes made from today's visit: -Recommended for patient to check BP regularly at home -Updated medication list and allergies per patient request  Plan: -Transfer PCP to Dr. Yong Channel  -Follow up with CPP in [place] office    Subjective: Rebecca [name] is an 88 y.o. year old female who is a primary patient of Burchette, Alinda Sierras, MD.  The CCM team was consulted for assistance with disease management and care coordination needs.    Engaged with patient by telephone for follow up visit in response to provider referral for pharmacy case management and/or care coordination services.   Consent to Services:  The patient was given information about Chronic Care Management services, agreed to services, and gave verbal consent prior to initiation of services.  Please see initial visit note for detailed documentation.   Patient Care Team: [name], MD as PCP - General (Family Medicine) Ladene Artist, MD as Consulting Physician (Gastroenterology) Aloha Gell, MD as Consulting Physician (Obstetrics and Gynecology) Luberta Mutter, MD as Consulting Physician (Ophthalmology) [name], [facility] as Pharmacist (Pharmacist)  Recent office visits: 05/18/21 Carolann Littler, MD: Patient presented for cough. Prescribed Zithromax x 5 days and consider OTC Mucinex.  04/26/21 Carolann Littler, MD: Patient presented for chronic conditions follow up. Prescribed ciclopirox cream for possible fungal infection.   04/09/21 Carolann Littler, MD: Patient presented for hospital follow up.  03/12/21 Carolann Littler, MD: Patient presented for RUQ abdominal pain.  02/17/21 Carolann Littler, MD: Patient presented for hypokalemia and follow up lab work.  02/10/21 Carolann Littler, MD: Patient  presented for jaundice. Plan for abdominal ultrasound and lab work.  Recent consult visits: 05/21/21 Sullivan Lone, MD (oncology): Patient presented for CLL follow up. Plan for CT chest in 12 weeks.  05/05/21 Sullivan Lone, MD (oncology): Patient presented for initial work up. Plan for lab work and consider diagnosis of CLL/SLL.  03/10/21 Danella Sensing (dermatology): Patient presented for follow up. Unable to access notes.  03/01/21 Aloha Gell (OBGYN): Patient presented for GYN exam. Unable to access notes.  01/12/21 Joni Fears, MD (ortho): Patient presented for OA of both knees follow up. Hyaluronan injection #3 administered.  01/05/21 Joni Fears, MD (ortho): Patient presented for OA of both knees follow up. Hyaluronan injection #2 administered.  12/29/20 Joni Fears, MD (ortho): Patient presented for OA of both knees. Plan for 3 orthovisc injections. Follow up in 1 week for repeat injections.  12/23/20 Luberta Mutter (ophthalmology): Patient presented for glaucoma. Unable to access notes.  Hospital visits: 12/22-12/29/22 Patient admitted to [facility] for cholelithiasis. No medication changes.   Objective:  Lab Results  Component Value Date   CREATININE 0.81 05/05/2021   BUN 13 05/05/2021   GFR 71.09 04/09/2021   GFRNONAA >60 05/05/2021   GFRAA >60 02/23/2016   NA 131 (L) 05/05/2021   K 4.4 05/05/2021   CALCIUM 10.1 05/05/2021   CO2 26 05/05/2021   GLUCOSE 109 (H) 05/05/2021    Lab Results  Component Value Date/Time   HGBA1C 5.6 07/26/2006 08:23 AM   GFR 71.09 04/09/2021 11:56 AM   GFR 60.51 02/17/2021 12:01 PM    Last diabetic Eye exam: No results found for: HMDIABEYEEXA  Last diabetic Foot exam: No results found for: HMDIABFOOTEX   Lab Results  Component  Value Date   CHOL 132 08/21/2020   HDL 39.20 08/21/2020   LDLCALC 55 08/21/2020   LDLDIRECT 89.0 05/13/2020   TRIG 188.0 (H) 08/21/2020   CHOLHDL 3 08/21/2020    Hepatic  Function Latest Ref Rng & Units 05/05/2021 04/09/2021 03/31/2021  Total Protein 6.5 - 8.1 g/dL 6.3(L) 6.3 5.3(L)  Albumin 3.5 - 5.0 g/dL 4.2 4.0 2.9(L)  AST 15 - 41 U/L _0 ALT 0 - 44 U/L 18 18 47(H)  Alk Phosphatase 38 - 126 U/L 88 81 82  Total Bilirubin 0.3 - 1.2 mg/dL 0.6 0.7 1.0  Bilirubin, Direct 0.0 - 0.2 mg/dL - - 0.3(H)    Lab Results  Component Value Date/Time   TSH 0.766 03/26/2021 03:38 AM   TSH 0.73 03/20/2020 11:21 AM   TSH 2.24 08/16/2019 11:07 AM   FREET4 1.43 (H) 03/26/2021 03:38 AM   FREET4 0.92 04/28/2011 12:44 PM    CBC Latest Ref Rng & Units 05/05/2021 04/09/2021 04/01/2021  WBC 4.0 - 10.5 K/uL 22.1(H) 19.2 Repeated and verified X2.([place]) 21.7(H)  Hemoglobin 12.0 - 15.0 g/dL 11.9(L) 11.1(L) 9.9(L)  Hematocrit 36.0 - 46.0 % 35.8(L) 33.4(L) 29.4(L)  Platelets 150 - 400 K/uL 141(L) 219.0 184    Lab Results  Component Value Date/Time   VD25OH 45 03/20/2020 11:21 AM   VD25OH 47.49 01/01/2016 09:55 AM   VD25OH 41.39 12/25/2013 09:39 AM    Clinical ASCVD: Yes  The ASCVD Risk score (Arnett DK, et al., 2019) failed to calculate for the following reasons:   The 2019 ASCVD risk score is only valid for ages 27 to 75    Depression screen PHQ 2/9 05/18/2021 04/09/2021 03/20/2020  Decreased Interest 0 0 0  Down, Depressed, Hopeless 0 0 0  PHQ - 2 Score 0 0 0  Altered sleeping 0 0 -  Tired, decreased energy 0 3 -  Change in appetite 0 0 -  Feeling bad or failure about yourself  0 0 -  Trouble concentrating 0 0 -  Moving slowly or fidgety/restless 0 0 -  Suicidal thoughts 0 0 -  PHQ-9 Score 0 3 -  Difficult doing work/chores - - -  Some recent data might be hidden     Social History   Tobacco Use  Smoking Status Never  Smokeless Tobacco Never   BP Readings from Last 3 Encounters:  05/18/21 120/60  05/05/21 (!) 152/64  04/26/21 140/60   Pulse Readings from Last 3 Encounters:  05/18/21 (!) 55  05/05/21 (!) 50  04/26/21 63   Wt Readings from Last 3  Encounters:  05/18/21 111 lb 1.6 oz (50.4 kg)  05/05/21 111 lb 4.8 oz (50.5 kg)  04/26/21 110 lb (49.9 kg)   BMI Readings from Last 3 Encounters:  05/18/21 20.32 kg/m  05/05/21 20.36 kg/m  04/26/21 20.12 kg/m    Assessment/Interventions: Review of patient past medical history, allergies, medications, health status, including review of consultants reports, laboratory and other test data, was performed as part of comprehensive evaluation and provision of chronic care management services.   SDOH:  (Social Determinants of Health) assessments and interventions performed: No  SDOH Screenings   Alcohol Screen: Not on file  Depression (PHQ2-9): Low Risk    PHQ-2 Score: 0  Financial Resource Strain: Not on file  Food Insecurity: Not on file  Housing: Not on file  Physical Activity: Not on file  Social Connections: Not on file  Stress: Not on file  Tobacco Use: Low Risk  Smoking Tobacco Use: Never   Smokeless Tobacco Use: Never   Passive Exposure: Not on file  Transportation Needs: Not on file    Downs  Allergies  Allergen Reactions   Levaquin [Levofloxacin] Other (See Comments)    Very nauseous and shakey   Sulfamethoxazole-Trimethoprim Nausea Only    shakey   Lactose Intolerance (Gi) Diarrhea and Other (See Comments)    Stomach hurts    Tramadol Hcl Nausea Only and Palpitations   Augmentin [Amoxicillin-Pot Clavulanate] Diarrhea   Ciprofloxacin Nausea Only    REACTION: Thrush, Nausea, Shakey   Pneumococcal Vaccine Polyvalent     REACTION: RED AND RAISED RASH ON ARM   Zosyn [Piperacillin Sod-Tazobactam So] Diarrhea   Adhesive [Tape] Rash   Codeine Nausea Only    Medications Reviewed Today     Reviewed by [name], [facility] (Pharmacist) on 05/28/21 at 1425  Med List Status: <None>   Medication Order Taking? Sig Documenting Provider Last Dose Status Informant  ALPRAZolam (XANAX) 0.25 MG tablet [id number] Yes TAKE 1 TABLET(0.25 MG) BY MOUTH AT BEDTIME AS  NEEDED [name], MD Taking Active Self           Med Note Broadus John, [facility]   Sat Mar 27, 2021  7:10 PM)    Calcium Carb-Cholecalciferol (CALCIUM-VITAMIN D) 600-400 MG-UNIT TABS [id number] Yes Take 1 tablet by mouth daily. [provider] Taking Active Self  Cholecalciferol (VITAMIN D3) 2000 UNITS TABS [id number] Yes Take 1 tablet by mouth daily. [provider] Taking Active Self  diclofenac sodium (VOLTAREN) 1 % GEL [id number] Yes Apply 2 g topically 4 (four) times daily. [name], MD Taking Active Self  esomeprazole (NEXIUM) 20 MG capsule [id number] Yes Take 20 mg by mouth 2 (two) times daily before a meal. [provider] Taking Active Self  latanoprost (XALATAN) 0.005 % ophthalmic solution [id number] Yes Place 1 drop into both eyes at bedtime. [provider] Taking Active Self           Med Note Maud Deed   XFG Mar 27, 2021  7:13 PM)    levothyroxine (SYNTHROID) 88 MCG tablet [id number] Yes TAKE 1 TABLET(88 MCG) BY MOUTH DAILY Burchette, Alinda Sierras, MD Taking Active Self           Med Note Broadus John, [place]   Sat Mar 27, 2021  7:14 PM)    loperamide (IMODIUM) 2 MG capsule [id number] Yes Take 2 capsules (4 mg total) by mouth every 6 (six) hours as needed for diarrhea or loose stools. Antonieta Pert, MD Taking Active   losartan (COZAAR) 100 MG tablet [id number] Yes TAKE 1 TABLET BY MOUTH EVERY DAY [name], MD Taking Active Self           Med Note Broadus John, [place]   Sat Mar 27, 2021  7:15 PM)    metoCLOPramide (REGLAN) 5 MG tablet [id number] Yes TAKE 1 TABLET BY MOUTH TWICE DAILY WITH MEALS  Patient taking differently: Take 5 mg by mouth daily. TAKE 1 TABLET BY MOUTH TWICE DAILY WITH MEALS   Burchette, Alinda Sierras, MD Taking Active Self           Med Note Kipp Brood, Prabhjot Maddux G   Fri May 28, 2021  2:19 PM)      Discontinued 07/28/11 1010 (Reorder)   ondansetron (ZOFRAN-ODT) 4 MG disintegrating tablet [id number] Yes DISSOLVE 1 TABLET(4 MG) ON THE  TONGUE EVERY 8 HOURS AS NEEDED FOR NAUSEA OR VOMITING Burchette, Alinda Sierras, MD Taking Active  Self           Med Note Maud Deed   ZOX Mar 27, 2021  7:16 PM)    rosuvastatin (CRESTOR) 20 MG tablet [id number] Yes TAKE 1 TABLET(20 MG) BY MOUTH DAILY Burchette, Alinda Sierras, MD Taking Active Self           Med Note Broadus John, [place]   Sat Mar 27, 2021  7:17 PM)    timolol (TIMOPTIC) 0.5 % ophthalmic solution [id number] Yes Place 1 drop into both eyes 2 (two) times daily. [provider] Taking Active Self           Med Note Maud Deed   NFA Mar 27, 2021  7:14 PM)    vitamin B-12 (CYANOCOBALAMIN) 500 MCG tablet [id number] Yes Take 500 mcg by mouth every morning. [provider] Taking Active Self  vitamin C (ASCORBIC ACID) 500 MG tablet [id number] Yes Take 500 mg by mouth daily. [provider] Taking Active Self            Patient Active Problem List   Diagnosis Date Noted   Malnutrition of moderate degree 03/27/2021   Choledocholithiasis 03/26/2021   Cholangitis 03/26/2021   Cholelithiasis 03/25/2021   Elevated LFTs 03/25/2021   Chronic hyponatremia 03/25/2021   Bilateral primary osteoarthritis of knee 06/11/2019   Pain in left shoulder 05/22/2019   Unspecified vitamin D deficiency 12/25/2013   Atherosclerosis 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 04/20/2011   SPINAL STENOSIS, LUMBAR 03/09/2010   OTHER OSTEOPOROSIS 03/09/2010   SCOLIOSIS, LUMBAR SPINE 03/09/2010   MIGRAINE, OPHTHALMIC 10/29/2009   LUMBAR RADICULOPATHY, ATYPICAL 10/29/2009   POLYNEUROPATHY OTHER DISEASES CLASSIFIED ELSW 09/22/2009   UNSPECIFIED PERIPHERAL VASCULAR DISEASE 09/22/2009   ADENOMATOUS COLONIC POLYP 06/10/2009   History of iron deficiency 05/06/2009   CAROTID ARTERY DISEASE 07/28/2008   Allergic rhinitis 03/20/2008   DYSPHAGIA 03/12/2008   GANGLION OF TENDON SHEATH 08/17/2007   Hypothyroidism 11/15/2006   Hyperlipidemia 11/07/2006   ADVEF,  DRUG/MEDICINAL/BIOLOGICAL SUBST NOS 11/07/2006   Essential hypertension 10/18/2006   GERD 10/18/2006   DIVERTICULOSIS, COLON 10/18/2006    Immunization History  Administered Date(s) Administered   Fluad Quad(high Dose 65+) 12/31/2018, 01/06/2021   H1N1 06/02/2008   Influenza Split 12/29/2010   Influenza Whole 04/04/2001, 02/01/2007, 12/24/2007, 12/16/2008, 01/15/2009, 01/05/2010   Influenza, High Dose Seasonal PF 12/31/2014, 01/01/2016, 01/11/2017, 01/02/2018   Influenza,inj,Quad PF,6+ Mos 12/19/2012, 12/25/2013   Influenza-Unspecified 02/10/2020   PFIZER(Purple Top)SARS-COV-2 Vaccination 05/06/2019, 05/27/2019, 02/10/2020, 01/10/2021   Pneumococcal Polysaccharide-23 03/05/1999   Td 04/05/1999, 07/28/2008   Zoster Recombinat (Shingrix) 08/04/2016, 10/12/2016   Zoster, Live 10/24/2007    Conditions to be addressed/monitored:  Hypertension, Hyperlipidemia, GERD, Hypothyroidism, Anxiety, Osteopenia and Neuropathy  Conditions addressed this visit: Hypertension, hyperlipidemia  Care Plan : [place]  Updates made by [name], [place] since 06/01/2021 12:00 AM     Problem: Problem: Hypertension, Hyperlipidemia, GERD, Hypothyroidism, Anxiety, Osteopenia and Neuropathy      Long-Range Goal: Patient-Specific Goal   Start Date: 09/04/2020  Expected End Date: 09/04/2021  Recent Progress: On track  Priority: High  Note:   Current Barriers:  Unable to independently monitor therapeutic efficacy  Pharmacist Clinical Goal(s):  Patient will achieve adherence to monitoring guidelines and medication adherence to achieve therapeutic efficacy through collaboration with PharmD and provider.   Interventions: 1:1 collaboration with [name], MD regarding development and update of comprehensive plan of care as evidenced by provider attestation and co-signature Inter-disciplinary care team collaboration (  see longitudinal plan of care) Comprehensive medication review  performed; medication list updated in electronic medical record  Hypertension (BP goal <140/90) -Controlled -Current treatment: Losartan 100 mg 1 tablet daily - Appropriate, Effective, Safe, Accessible -Medications previously tried: none  -Current home readings: not checking -Current dietary habits: limits salt intake -Current exercise habits: limited -Denies hypotensive/hypertensive symptoms -Educated on BP goals and benefits of medications for prevention of heart attack, stroke and kidney damage; Importance of home blood pressure monitoring; Proper BP monitoring technique; -Counseled to monitor BP at home weekly, document, and provide log at future appointments -Counseled on diet and exercise extensively Recommended to continue current medication  Hyperlipidemia: (LDL goal < 70) -Controlled -Current treatment: Rosuvastatin 1 tablet daily - Appropriate, Effective, Safe, Accessible -Medications previously tried: simvastatin (myalgias) -Current dietary patterns: did not discuss -Current exercise habits: limited -Educated on Cholesterol goals;  Benefits of statin for ASCVD risk reduction; Importance of limiting foods high in cholesterol; Exercise goal of 150 minutes per week; -Counseled on diet and exercise extensively Recommended to continue current medication  Anxiety (Goal: minimize symptoms) -Controlled -Current treatment: Alprazolam 0.25 mg 1 tablet at bedtime as needed - Appropriate, Effective, Safe, Accessible -Medications previously tried/failed: none -GAD7: n/a -Educated on Benefits of medication for symptom control -Recommended to continue current medication Counseled on continued use of alprazolam sparingly due to long term risks  Osteopenia (Goal prevent fractures) -Controlled -Last DEXA Scan: not able to access records from OBGYN   T-Score femoral neck: n/a  T-Score total hip: n/a  T-Score lumbar spine: n/a  T-Score forearm radius: n/a  10-year probability  of major osteoporotic fracture: n/a  10-year probability of hip fracture: n/a -Patient is not a candidate for pharmacologic treatment -Current treatment  Vitamin D 2000 units 1 tablet daily - Appropriate, Effective, Safe, Accessible Calcium carbonate-vit D 600-200 mg-unit 1 tablet 2 times daily - Appropriate, Effective, Safe, Accessible -Medications previously tried: Reclast (finished 5 years of treatment) -Recommend [phone] units of vitamin D daily. Recommend 1200 mg of calcium daily from dietary and supplemental sources. Recommend weight-bearing and muscle strengthening exercises for building and maintaining bone density. -Counseled on diet and exercise extensively  Neuropathy (Goal: minimize symptoms) -Controlled -Current treatment  No medications -Medications previously tried: gabapentin -Recommended to continue current medication  GERD (Goal: minimize symptoms) -Controlled -Current treatment  Esomeprazole 20 mg 1 capsule twice daily - Appropriate, Effective, Safe, Accessible Metoclopramide 5 mg 1 tablet daily (written as twice daily) - Appropriate, Effective, Safe, Accessible -Medications previously tried: none  -Counseled on diet and exercise extensively Recommended to continue current medication  Eye medications (Goal: minimize symptoms) -Controlled -Current treatment  Latanoprost 0.005% ophthalmic solution place 1 drop in both eyes at bedtime - Appropriate, Effective, Safe, Accessible Timolol 0.5% ophthalmic solution place 1 drop in both eyes twice daily Appropriate, Effective, Safe, Accessible -Medications previously tried: Administrator (cost) -Recommended to continue current medication   Health Maintenance -Vaccine gaps: tetanus (too expensive), Prevnar -Current therapy:  Vitamin B 12 500 mcg 1 tablets daily Diclofenac gel 1% as needed Vitamin C 500 mg 1 tablet daily -Educated on Cost vs benefit of each product must be carefully weighed by individual consumer -Patient  is satisfied with current therapy and denies issues -Recommended to continue current medication  Patient Goals/Self-Care Activities Patient will:  - take medications as prescribed check blood pressure weekly, document, and provide at future appointments target a minimum of 150 minutes of moderate intensity exercise weekly  Follow Up Plan: The patient has been provided  with contact information for the care management team and has been advised to call with any health related questions or concerns.          Medication Assistance: None required.  Patient affirms current coverage meets needs.  Compliance/Adherence/Medication fill history: Care Gaps: Tetanus BP- 120/60 (05/18/21)  Star-Rating Drugs: Losartan 100 mg - Last filled 05/03/21 90 DS at [facility] Rosuvastatin 20 mg - Last filled 05/03/21  Patient's preferred pharmacy is:  [facility] [place], [address] [place] [address] Phone: [phone] Fax: [phone]  Uses pill box? No - in medicine cabinet Pt endorses 100% compliance  We discussed: Current pharmacy is preferred with insurance plan and patient is satisfied with pharmacy services Patient decided to: Continue current medication management strategy  Care Plan and Follow Up Patient Decision:  Patient agrees to Care Plan and Follow-up.  Plan: The patient has been provided with contact information for the care management team and has been advised to call with any health related questions or concerns.   Jeni Salles, PharmD [facility] Clinical Pharmacist [place] [facility] [place]

## 2021-05-28 NOTE — Telephone Encounter (Signed)
Yes, this is fine.

## 2021-05-31 NOTE — Telephone Encounter (Signed)
Called pt and left VM to call our office to schedule a TOC.

## 2021-06-01 DIAGNOSIS — I10 Essential (primary) hypertension: Secondary | ICD-10-CM

## 2021-06-01 DIAGNOSIS — E785 Hyperlipidemia, unspecified: Secondary | ICD-10-CM | POA: Diagnosis not present

## 2021-06-01 DIAGNOSIS — M8580 Other specified disorders of bone density and structure, unspecified site: Secondary | ICD-10-CM | POA: Diagnosis not present

## 2021-06-01 NOTE — Patient Instructions (Signed)
Hi Rebecca [name],  It was great to catch up with you again! Just as we discussed, I sent a message to Rebecca [name] (the pharmacist at [name] office) to reach out to you in a couple of months to check back in on you.  Please reach out to me if you have any questions or need anything in the future!  Best, Rebecca [name]  Rebecca [name], PharmD, [facility]   Visit Information   Goals Addressed   None    Patient Care Plan: CCM Pharmacy Care Plan     Problem Identified: Problem: Hypertension, Hyperlipidemia, GERD, Hypothyroidism, Anxiety, Osteopenia and Neuropathy      Long-Range Goal: Patient-Specific Goal   Start Date: 09/04/2020  Expected End Date: 09/04/2021  Recent Progress: On track  Priority: High  Note:   Current Barriers:  Unable to independently monitor therapeutic efficacy  Pharmacist Clinical Goal(s):  Patient will achieve adherence to monitoring guidelines and medication adherence to achieve therapeutic efficacy through collaboration with PharmD and provider.   Interventions: 1:1 collaboration with Rebecca Post, MD regarding development and update of comprehensive plan of care as evidenced by provider attestation and co-signature Inter-disciplinary care team collaboration (see longitudinal plan of care) Comprehensive medication review performed; medication list updated in electronic medical record  Hypertension (BP goal <140/90) -Controlled -Current treatment: Losartan 100 mg 1 tablet daily - Appropriate, Effective, Safe, Accessible -Medications previously tried: none  -Current home readings: not checking -Current dietary habits: limits salt intake -Current exercise habits: limited -Denies hypotensive/hypertensive symptoms -Educated on BP goals and benefits of medications for prevention of heart attack, stroke and kidney damage; Importance of home blood pressure monitoring; Proper BP monitoring  technique; -Counseled to monitor BP at home weekly, document, and provide log at future appointments -Counseled on diet and exercise extensively Recommended to continue current medication  Hyperlipidemia: (LDL goal < 70) -Controlled -Current treatment: Rosuvastatin 1 tablet daily - Appropriate, Effective, Safe, Accessible -Medications previously tried: simvastatin (myalgias) -Current dietary patterns: did not discuss -Current exercise habits: limited -Educated on Cholesterol goals;  Benefits of statin for ASCVD risk reduction; Importance of limiting foods high in cholesterol; Exercise goal of 150 minutes per week; -Counseled on diet and exercise extensively Recommended to continue current medication  Anxiety (Goal: minimize symptoms) -Controlled -Current treatment: Alprazolam 0.25 mg 1 tablet at bedtime as needed - Appropriate, Effective, Safe, Accessible -Medications previously tried/failed: none -GAD7: n/a -Educated on Benefits of medication for symptom control -Recommended to continue current medication Counseled on continued use of alprazolam sparingly due to long term risks  Osteopenia (Goal prevent fractures) -Controlled -Last DEXA Scan: not able to access records from OBGYN   T-Score femoral neck: n/a  T-Score total hip: n/a  T-Score lumbar spine: n/a  T-Score forearm radius: n/a  10-year probability of major osteoporotic fracture: n/a  10-year probability of hip fracture: n/a -Patient is not a candidate for pharmacologic treatment -Current treatment  Vitamin D 2000 units 1 tablet daily - Appropriate, Effective, Safe, Accessible Calcium carbonate-vit D 600-200 mg-unit 1 tablet 2 times daily - Appropriate, Effective, Safe, Accessible -Medications previously tried: Reclast (finished 5 years of treatment) -Recommend [id number] units of vitamin D daily. Recommend 1200 mg of calcium daily from dietary and supplemental sources. Recommend weight-bearing and muscle  strengthening exercises for building and maintaining bone density. -Counseled on diet and exercise extensively  Neuropathy (Goal: minimize symptoms) -Controlled -Current treatment  No medications -Medications previously tried: gabapentin -Recommended to continue current medication  GERD (Goal:  minimize symptoms) -Controlled -Current treatment  Esomeprazole 20 mg 1 capsule twice daily - Appropriate, Effective, Safe, Accessible Metoclopramide 5 mg 1 tablet daily (written as twice daily) - Appropriate, Effective, Safe, Accessible -Medications previously tried: none  -Counseled on diet and exercise extensively Recommended to continue current medication  Eye medications (Goal: minimize symptoms) -Controlled -Current treatment  Latanoprost 0.005% ophthalmic solution place 1 drop in both eyes at bedtime - Appropriate, Effective, Safe, Accessible Timolol 0.5% ophthalmic solution place 1 drop in both eyes twice daily Appropriate, Effective, Safe, Accessible -Medications previously tried: Administrator (cost) -Recommended to continue current medication   Health Maintenance -Vaccine gaps: tetanus (too expensive), Prevnar -Current therapy:  Vitamin B 12 500 mcg 1 tablets daily Diclofenac gel 1% as needed Vitamin C 500 mg 1 tablet daily -Educated on Cost vs benefit of each product must be carefully weighed by individual consumer -Patient is satisfied with current therapy and denies issues -Recommended to continue current medication  Patient Goals/Self-Care Activities Patient will:  - take medications as prescribed check blood pressure weekly, document, and provide at future appointments target a minimum of 150 minutes of moderate intensity exercise weekly  Follow Up Plan: The patient has been provided with contact information for the care management team and has been advised to call with any health related questions or concerns.         Patient verbalizes understanding of instructions  and care plan provided today and agrees to view in [facility]. Active MyChart status confirmed with patient.   Patient will be transferring PCPs to [facility] [name] office.  [name], [facility]

## 2021-06-16 ENCOUNTER — Ambulatory Visit (INDEPENDENT_AMBULATORY_CARE_PROVIDER_SITE_OTHER): Payer: Medicare Other | Admitting: Orthopaedic Surgery

## 2021-06-16 ENCOUNTER — Encounter: Payer: Self-pay | Admitting: Orthopaedic Surgery

## 2021-06-16 ENCOUNTER — Ambulatory Visit (INDEPENDENT_AMBULATORY_CARE_PROVIDER_SITE_OTHER): Payer: Medicare Other

## 2021-06-16 ENCOUNTER — Other Ambulatory Visit: Payer: Self-pay

## 2021-06-16 ENCOUNTER — Ambulatory Visit: Payer: Self-pay

## 2021-06-16 DIAGNOSIS — M25562 Pain in left knee: Secondary | ICD-10-CM | POA: Diagnosis not present

## 2021-06-16 DIAGNOSIS — M25561 Pain in right knee: Secondary | ICD-10-CM | POA: Diagnosis not present

## 2021-06-16 DIAGNOSIS — G8929 Other chronic pain: Secondary | ICD-10-CM

## 2021-06-16 MED ORDER — METHYLPREDNISOLONE ACETATE 40 MG/ML IJ SUSP
60.0000 mg | INTRAMUSCULAR | Status: AC | PRN
  Administered 2021-06-16: 60 mg via INTRA_ARTICULAR

## 2021-06-16 MED ORDER — LIDOCAINE HCL 1 % IJ SOLN
5.0000 mL | INTRAMUSCULAR | Status: AC | PRN
  Administered 2021-06-16: 5 mL

## 2021-06-16 NOTE — Progress Notes (Signed)
? ?Office Visit Note ?  ?Patient: Rebecca [name]           ?Date of Birth: 10-08-1932           ?MRN: [id number] ?Visit Date: 06/16/2021 ?             ?Requested by: [name], MD ?[facility] ?[place],  [place] [address] ?PCP: [name], MD ? ? ?Assessment & Plan: ?Visit Diagnoses:  ?1. Chronic pain of both knees   ? ? ?Plan: 88 year old with history of end-stage osteoarthritis of her knees.  She did most recently try Orthovisc injections about 6 months ago she is not sure if she had any significant relief on these.  She is not interested in knee replacement.  She is currently seeing an oncologist for CLL.  She does take Motrin as needed.  She would like to try bilateral steroid injections today see if this can help her.  She understands the only fix for her issue is knee replacement she would like to continue with conservative treatment ? ?Follow-Up Instructions: No follow-ups on file.  ? ?Orders:  ?Orders Placed This Encounter  ?Procedures  ?? XR KNEE 3 VIEW LEFT  ?? XR KNEE 3 VIEW RIGHT  ? ?No orders of the defined types were placed in this encounter. ? ? ? ? Procedures: ?Large Joint Inj: bilateral knee on 06/16/2021 2:31 PM ?Indications: pain and diagnostic evaluation ?Details: 25 G 1.5 in needle, anteromedial approach ? ?Arthrogram: No ? ?Medications (Right): 5 mL lidocaine 1 %; 60 mg methylPREDNISolone acetate 40 MG/ML ?Medications (Left): 5 mL lidocaine 1 %; 60 mg methylPREDNISolone acetate 40 MG/ML ?Outcome: tolerated well, no immediate complications ?Procedure, treatment alternatives, risks and benefits explained, specific risks discussed. Consent was given by the patient.  ? ? ? ? ?Clinical Data: ?No additional findings. ? ? ?Subjective: ?Chief Complaint  ?Patient presents with  ?? Right Knee - Pain  ?? Left Knee - Pain  ?Patient presents today for bilateral knee pain. She said that they have hurt for quite some time, but getting worse. She has pain with flexion in her knees  while walking, climb steps, etc. She feels better with rest. She is not diabetic.  ? ? ? ?Review of Systems  ?All other systems reviewed and are negative. ? ? ?Objective: ?Vital Signs: There were no vitals taken for this visit. ? ?Physical Exam ?Constitutional:   ?   Appearance: Normal appearance.  ?Pulmonary:  ?   Effort: Pulmonary effort is normal.  ?Neurological:  ?   General: No focal deficit present.  ?   Mental Status: She is alert and oriented to person, place, and time.  ? ? ?Ortho Exam ?Examination of her bilateral knees she has no effusions no redness no swelling.  She does have quite a bit of crepitation with range of motion.  She has minimal patellar mobility global pain throughout the knee bilaterally ?Specialty Comments:  ?No specialty comments available. ? ?Imaging: ?No results found. ? ? ?PMFS History: ?Patient Active Problem List  ? Diagnosis Date Noted  ?? Malnutrition of moderate degree 03/27/2021  ?? Choledocholithiasis 03/26/2021  ?? Cholangitis 03/26/2021  ?? Cholelithiasis 03/25/2021  ?? Elevated LFTs 03/25/2021  ?? Chronic hyponatremia 03/25/2021  ?? Bilateral primary osteoarthritis of knee 06/11/2019  ?? Pain in left shoulder 05/22/2019  ?? Unspecified vitamin D deficiency 12/25/2013  ?? Atherosclerosis 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 04/20/2011  ?? SPINAL STENOSIS, LUMBAR 03/09/2010  ?? OTHER  OSTEOPOROSIS 03/09/2010  ?? SCOLIOSIS, LUMBAR SPINE 03/09/2010  ?? MIGRAINE, OPHTHALMIC 10/29/2009  ?? LUMBAR RADICULOPATHY, ATYPICAL 10/29/2009  ?? POLYNEUROPATHY OTHER DISEASES CLASSIFIED ELSW 09/22/2009  ?? UNSPECIFIED PERIPHERAL VASCULAR DISEASE 09/22/2009  ?? ADENOMATOUS COLONIC POLYP 06/10/2009  ?? History of iron deficiency 05/06/2009  ?? CAROTID ARTERY DISEASE 07/28/2008  ?? Allergic rhinitis 03/20/2008  ?? DYSPHAGIA 03/12/2008  ?? GANGLION OF TENDON SHEATH 08/17/2007  ?? Hypothyroidism 11/15/2006  ?? Hyperlipidemia 11/07/2006  ?? ADVEF,  DRUG/MEDICINAL/BIOLOGICAL SUBST NOS 11/07/2006  ?? Essential hypertension 10/18/2006  ?? GERD 10/18/2006  ?? DIVERTICULOSIS, COLON 10/18/2006  ? ?Past Medical History:  ?Diagnosis Date  ?? Anemia   ?? Anxiety   ?? Bifascicular block   ?? Diverticulitis   ?? Diverticulosis   ?? Gastroparesis   ?? GERD (gastroesophageal reflux disease)   ?? Glaucoma   ?? Hemorrhoids   ?? Hyperlipidemia   ?? Hypertension   ?? Hypertension   ?? IBS (irritable bowel syndrome)   ?? Rectal ulcer   ?? 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 Tubular adenoma of colon 05/2009  ?  ?Family History  ?Problem Relation Age of Onset  ?? Diabetes Mother   ?? Heart disease Mother   ?? Hyperlipidemia Mother   ?? Hypertension Mother   ?? Anuerysm Father   ?? Heart disease Father   ?? Hypertension Father   ?? Migraines Daughter   ?? Kidney Stones Daughter   ?? Colon polyps Sister   ?? Lung cancer Sister   ?? Colon polyps Brother   ?? Diabetes Brother   ?? Hyperlipidemia Brother   ?? Hypertension Brother   ?? Colon polyps Daughter   ?? Breast cancer Daughter   ?? Colon cancer Maternal Aunt   ?     Rectal cancer  ?? Cancer Daughter   ?     salivary  ?? Colon cancer Paternal Grandmother   ?     with possible stomach cancer  ?? Heart disease Brother   ?? Crohn's disease Neg Hx   ?? Pancreatic cancer Neg Hx   ?  ?Past Surgical History:  ?Procedure Laterality Date  ?? APPENDECTOMY    ?? ARCUATE KERATECTOMY    ?? BILIARY DILATION  03/27/2021  ? Procedure: BILIARY DILATION;  Surgeon: [name], MD;  Location: [facility];  Service: Gastroenterology;;  ?? CATARACT EXTRACTION, BILATERAL  2015  ?? CHOLECYSTECTOMY N/A 03/28/2021  ? Procedure: LAPAROSCOPIC CHOLECYSTECTOMY;  Surgeon: Leighton Ruff, MD;  Location: [facility];  Service: General;  Laterality: N/A;  ?? double pallital tori bone removed     ?? ERCP N/A 03/27/2021  ? Procedure: ENDOSCOPIC RETROGRADE CHOLANGIOPANCREATOGRAPHY (ERCP);  Surgeon: [name], MD;  Location: [facility];  Service:  Gastroenterology;  Laterality: N/A;  ?? HERNIA REPAIR    ? umbilical  ?? KNEE SURGERY Bilateral   ?? morton's neuroma on rt foot and platar facial release    ?? MOUTH SURGERY    ? "bone shaved off roof of mouth"  ?? REMOVAL OF STONES  03/27/2021  ? Procedure: REMOVAL OF STONES;  Surgeon: [name], MD;  Location: [facility];  Service: Gastroenterology;;  ?? SALIVARY GLAND SURGERY    ? removal  ?? SPHINCTEROTOMY  03/27/2021  ? Procedure: SPHINCTEROTOMY;  Surgeon: [name], MD;  Location: [facility];  Service: Gastroenterology;;  ?? TONSILLECTOMY    ? ?Social History  ? ?Occupational History  ?? 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 Smoking status: Never  ??  Smokeless tobacco: Never  ?Vaping Use  ?? Vaping Use: Never used  ?Substance and Sexual Activity  ?? Alcohol use: Yes  ?  Alcohol/week: 0.0 standard drinks  ?  Comment: rare  ?? Drug use: No  ?? Sexual activity: Yes  ? ? ? ? ? ? ?

## 2021-06-24 ENCOUNTER — Other Ambulatory Visit: Payer: Self-pay | Admitting: Family Medicine

## 2021-07-07 ENCOUNTER — Ambulatory Visit (INDEPENDENT_AMBULATORY_CARE_PROVIDER_SITE_OTHER): Payer: Medicare Other | Admitting: Physician Assistant

## 2021-07-07 ENCOUNTER — Encounter: Payer: Self-pay | Admitting: Physician Assistant

## 2021-07-07 ENCOUNTER — Ambulatory Visit (INDEPENDENT_AMBULATORY_CARE_PROVIDER_SITE_OTHER): Payer: Medicare Other

## 2021-07-07 DIAGNOSIS — M25552 Pain in left hip: Secondary | ICD-10-CM | POA: Diagnosis not present

## 2021-07-07 NOTE — Progress Notes (Signed)
? ?Office Visit Note ?  ?Patient: Rebecca [name]           ?Date of Birth: 10-16-1932           ?MRN: [id number] ?Visit Date: 07/07/2021 ?             ?Requested by: [name], MD ?[place] ?[place],  [place] [address] ?PCP: [name], MD ? ?Chief Complaint  ?Patient presents with  ? Left Hip - Pain  ? ? ? ? ?HPI: ?Rebecca [name] is a pleasant 88 year old woman who presents today with a 2-day history of left hip pain.  She denies any other recent illness.  She just said she got up yesterday and had pain significant enough that had trouble bearing weight on the left hip.  She applied some ice and took it easy and is feeling much better today.  She describes today as just being sore.  She has a previous history of a greater trochanter fracture and some ectopic bone formation.  She denies any other knee or thigh pain ? ?Assessment & Plan: ?Visit Diagnoses:  ?1. Pain in left hip   ? ? ?Plan: Exam consistent with some tenderness over the greater greater trochanter.  She has good motion of her hip without any pain with internal/external rotation.  I am encouraged that she is feeling better.  I suggest she continue to treat this symptomatically with alternating ice and heat and taking Motrin as needed.  She will schedule a follow-up with Dr. Durward Fortes in 2 weeks.  If she is doing better she may cancel this.  If she has acute episodes of this pain once again increasing she will contact us we will get her into soon as possible ? ?Follow-Up Instructions: No follow-ups on file.  ? ?Ortho Exam ? ?Patient is alert, oriented, no adenopathy, well-dressed, normal affect, normal respiratory effort. ?Examination she ambulates comfortably with a cane.  Her compartments are soft and nontender.  She is mostly tender over the lateral proximal area of the hip in the area of the greater trochanter.  No groin pain.  No pain with range of motion internal/external rotation of her hip.  No redness of the hip no  swelling ? ?Imaging: ?XR HIP UNILAT W OR W/O PELVIS 2-3 VIEWS LEFT ? ?Result Date: 07/07/2021 ?Radiographs of her left hip were reviewed today.  She does have some early arthritic changes and some joint space narrowing in the inferior aspect of the femoral acetabular joint.  Cannot appreciate any acute fractures.  She does have some ectopic bone formation over the greater trochanter previous area of concern.  Do not see significant changes from previous x-rays done in August  ?No images are attached to the encounter. ? ?Labs: ?Lab Results  ?Component Value Date  ? HGBA1C 5.6 07/26/2006  ? ESRSEDRATE 22 02/10/2021  ? REPTSTATUS 03/04/2018 FINAL 03/03/2018  ? CULT  03/03/2018  ?  NO GROWTH ?Performed at [facility], [place] [address] [place],  [address] ?  ? LABORGA NO GROWTH 06/10/2016  ? ? ? ?Lab Results  ?Component Value Date  ? ALBUMIN 4.2 05/05/2021  ? ALBUMIN 4.0 04/09/2021  ? ALBUMIN 2.9 (L) 03/31/2021  ? ? ?Lab Results  ?Component Value Date  ? MG 1.8 03/28/2021  ? ?Lab Results  ?Component Value Date  ? VD25OH 45 03/20/2020  ? VD25OH 47.49 01/01/2016  ? VD25OH 41.39 12/25/2013  ? ? ?No results found for: PREALBUMIN ? ?  Latest  Ref Rng & Units 05/05/2021  ? 12:38 PM 04/09/2021  ? 11:56 AM 04/01/2021  ?  5:30 AM  ?CBC EXTENDED  ?WBC 4.0 - 10.5 K/uL 22.1   19.2 Repeated and verified X2.   21.7    ?RBC 3.87 - 5.11 MIL/uL 3.56   3.35   2.94    ?Hemoglobin 12.0 - 15.0 g/dL 11.9   11.1   9.9    ?HCT 36.0 - 46.0 % 35.8   33.4   29.4    ?Platelets 150 - 400 K/uL 141   219.0   184    ?NEUT# 1.7 - 7.7 K/uL 3.4   3.3   3.4    ?Lymph# 0.7 - 4.0 K/uL 15.8   15.2   16.4    ? ? ? ?There is no height or weight on file to calculate BMI. ? ?Orders:  ?Orders Placed This Encounter  ?Procedures  ? XR HIP UNILAT W OR W/O PELVIS 2-3 VIEWS LEFT  ? ?No orders of the defined types were placed in this encounter. ? ? ? Procedures: ?No procedures performed ? ?Clinical Data: ?No additional findings. ? ?ROS: ? ?All other systems  negative, except as noted in the HPI. ?Review of Systems ? ?Objective: ?Vital Signs: There were no vitals taken for this visit. ? ?Specialty Comments:  ?No specialty comments available. ? ?PMFS History: ?Patient Active Problem List  ? Diagnosis Date Noted  ? Malnutrition of moderate degree 03/27/2021  ? Choledocholithiasis 03/26/2021  ? Cholangitis 03/26/2021  ? Cholelithiasis 03/25/2021  ? Elevated LFTs 03/25/2021  ? Chronic hyponatremia 03/25/2021  ? Bilateral primary osteoarthritis of knee 06/11/2019  ? Pain in left shoulder 05/22/2019  ? Unspecified vitamin D deficiency 12/25/2013  ? Atherosclerosis 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 04/20/2011  ? SPINAL STENOSIS, LUMBAR 03/09/2010  ? OTHER OSTEOPOROSIS 03/09/2010  ? SCOLIOSIS, LUMBAR SPINE 03/09/2010  ? MIGRAINE, OPHTHALMIC 10/29/2009  ? LUMBAR RADICULOPATHY, ATYPICAL 10/29/2009  ? POLYNEUROPATHY OTHER DISEASES CLASSIFIED ELSW 09/22/2009  ? UNSPECIFIED PERIPHERAL VASCULAR DISEASE 09/22/2009  ? ADENOMATOUS COLONIC POLYP 06/10/2009  ? History of iron deficiency 05/06/2009  ? CAROTID ARTERY DISEASE 07/28/2008  ? Allergic rhinitis 03/20/2008  ? DYSPHAGIA 03/12/2008  ? GANGLION OF TENDON SHEATH 08/17/2007  ? Hypothyroidism 11/15/2006  ? Hyperlipidemia 11/07/2006  ? ADVEF, DRUG/MEDICINAL/BIOLOGICAL SUBST NOS 11/07/2006  ? Essential hypertension 10/18/2006  ? GERD 10/18/2006  ? DIVERTICULOSIS, COLON 10/18/2006  ? ?Past Medical History:  ?Diagnosis Date  ? Anemia   ? Anxiety   ? Bifascicular block   ? Diverticulitis   ? Diverticulosis   ? Gastroparesis   ? GERD (gastroesophageal reflux disease)   ? Glaucoma   ? Hemorrhoids   ? Hyperlipidemia   ? Hypertension   ? Hypertension   ? IBS (irritable bowel syndrome)   ? Rectal ulcer   ? Segmental colitis ([address])   ? Tubular adenoma of colon 05/2009  ?  ?Family History  ?Problem Relation Age of Onset  ? Diabetes Mother   ? Heart disease Mother   ? Hyperlipidemia Mother   ? Hypertension Mother    ? Anuerysm Father   ? Heart disease Father   ? Hypertension Father   ? Migraines Daughter   ? Kidney Stones Daughter   ? Colon polyps Sister   ? Lung cancer Sister   ? Colon polyps Brother   ? Diabetes Brother   ? Hyperlipidemia Brother   ? Hypertension Brother   ? Colon polyps Daughter   ?  Breast cancer Daughter   ? Colon cancer Maternal Aunt   ?     Rectal cancer  ? Cancer Daughter   ?     salivary  ? Colon cancer Paternal Grandmother   ?     with possible stomach cancer  ? Heart disease Brother   ? Crohn's disease Neg Hx   ? Pancreatic cancer Neg Hx   ?  ?Past Surgical History:  ?Procedure Laterality Date  ? APPENDECTOMY    ? ARCUATE KERATECTOMY    ? BILIARY DILATION  03/27/2021  ? Procedure: BILIARY DILATION;  Surgeon: [name], MD;  Location: [facility];  Service: Gastroenterology;;  ? CATARACT EXTRACTION, BILATERAL  2015  ? CHOLECYSTECTOMY N/A 03/28/2021  ? Procedure: LAPAROSCOPIC CHOLECYSTECTOMY;  Surgeon: Leighton Ruff, MD;  Location: [facility];  Service: General;  Laterality: N/A;  ? double pallital tori bone removed     ? ERCP N/A 03/27/2021  ? Procedure: ENDOSCOPIC RETROGRADE CHOLANGIOPANCREATOGRAPHY (ERCP);  Surgeon: [name], MD;  Location: [facility];  Service: Gastroenterology;  Laterality: N/A;  ? HERNIA REPAIR    ? umbilical  ? KNEE SURGERY Bilateral   ? morton's neuroma on rt foot and platar facial release    ? MOUTH SURGERY    ? "bone shaved off roof of mouth"  ? REMOVAL OF STONES  03/27/2021  ? Procedure: REMOVAL OF STONES;  Surgeon: [name], MD;  Location: [facility];  Service: Gastroenterology;;  ? SALIVARY GLAND SURGERY    ? removal  ? SPHINCTEROTOMY  03/27/2021  ? Procedure: SPHINCTEROTOMY;  Surgeon: [name], MD;  Location: [facility];  Service: Gastroenterology;;  ? TONSILLECTOMY    ? ?Social History  ? ?Occupational History  ? 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 Smoking status: Never  ? Smokeless tobacco: Never  ?Vaping  Use  ? Vaping Use: Never used  ?Substance and Sexual Activity  ? Alcohol use: Yes  ?  Alcohol/week: 0.0 standard drinks  ?  Comment: rare  ? Drug use: No  ? Sexual activity: Yes  ? ? ? ? ? ?

## 2021-07-08 ENCOUNTER — Telehealth: Payer: Medicare Other

## 2021-07-12 ENCOUNTER — Other Ambulatory Visit: Payer: Self-pay | Admitting: Family Medicine

## 2021-07-12 NOTE — Telephone Encounter (Signed)
Last T4 labs elevated- 03/26/21 ?Last OV- 05/18/2021 ?Patient's next OV is TOC to Dr. Yong Channel on 08/03/2021 ? ?Is okay for refill at same [facility]?   Please advise ?

## 2021-07-12 NOTE — Telephone Encounter (Signed)
Last refill- 11/30/2020-20 tabs, 1 refills ?Last OV- 05/18/2021 ? ?No future OV scheduled.   Can this patient receive a refill? ?

## 2021-07-14 DIAGNOSIS — Z85828 Personal history of other malignant neoplasm of skin: Secondary | ICD-10-CM | POA: Diagnosis not present

## 2021-07-14 DIAGNOSIS — D225 Melanocytic nevi of trunk: Secondary | ICD-10-CM | POA: Diagnosis not present

## 2021-07-14 DIAGNOSIS — L821 Other seborrheic keratosis: Secondary | ICD-10-CM | POA: Diagnosis not present

## 2021-07-14 DIAGNOSIS — D1801 Hemangioma of skin and subcutaneous tissue: Secondary | ICD-10-CM | POA: Diagnosis not present

## 2021-07-14 DIAGNOSIS — D2261 Melanocytic nevi of right upper limb, including shoulder: Secondary | ICD-10-CM | POA: Diagnosis not present

## 2021-07-15 ENCOUNTER — Telehealth: Payer: Self-pay | Admitting: Family Medicine

## 2021-07-15 ENCOUNTER — Other Ambulatory Visit: Payer: Self-pay | Admitting: Family Medicine

## 2021-07-15 ENCOUNTER — Other Ambulatory Visit: Payer: Self-pay

## 2021-07-15 DIAGNOSIS — E785 Hyperlipidemia, unspecified: Secondary | ICD-10-CM

## 2021-07-15 MED ORDER — LEVOTHYROXINE SODIUM 88 MCG PO TABS: 88.0000 ug | ORAL_TABLET | Freq: Every day | ORAL | 0 refills | Status: DC

## 2021-07-15 MED ORDER — ALPRAZOLAM 0.25 MG PO TABS: ORAL_TABLET | ORAL | 0 refills | Status: DC

## 2021-07-15 MED ORDER — LEVOTHYROXINE SODIUM 88 MCG PO TABS: ORAL_TABLET | ORAL | 0 refills | Status: DC

## 2021-07-15 NOTE — Telephone Encounter (Signed)
Patient called to get refill on ALPRAZolam (XANAX) 0.25 MG tablet ? ?levothyroxine (SYNTHROID) 88 [facility] tablet ? ?Patient only has one tablet left on thyroid medication.  ? ? ? ? ? ?Please send to  ?[id number] [address], [place] Phone:  [phone]  ?Fax:  [phone]  ?  ? ? ? ?Please advise  ?

## 2021-07-15 NOTE — Telephone Encounter (Signed)
Done for 30 days  

## 2021-07-15 NOTE — Telephone Encounter (Signed)
Lvm for patient, requested medication sent to pharmacy ?

## 2021-07-15 NOTE — Telephone Encounter (Signed)
Dr. Elease Hashimoto patient.  ? ?Last OV- 05/18/2021 ?Last refill on Xanax- 11/30/20-20 tabs, 1 refill ?Pharmacy updated.  ?**Refill for Levothyroxine has been sent to pharmacy ? ?Next OV TOC with Dr. Garret Reddish on 08/03/21. ?

## 2021-07-19 ENCOUNTER — Telehealth: Payer: Self-pay

## 2021-07-19 NOTE — Telephone Encounter (Signed)
I did not see this patient on my schedule for a past visit or any visit at [facility].  It looks like she is transferring care.  Can we reach out and get her scheduled for her preferred time?

## 2021-07-19 NOTE — Telephone Encounter (Signed)
Patient states she was supposed to have had an appt today 4/17 at 12pm with Christian.  Is requesting phone call back asap.

## 2021-07-20 ENCOUNTER — Other Ambulatory Visit: Payer: Self-pay | Admitting: Hematology

## 2021-07-20 ENCOUNTER — Ambulatory Visit: Payer: Medicare Other | Admitting: Orthopaedic Surgery

## 2021-07-20 DIAGNOSIS — C9110 Chronic lymphocytic leukemia of B-cell type not having achieved remission: Secondary | ICD-10-CM

## 2021-08-03 ENCOUNTER — Encounter: Payer: Medicare Other | Admitting: Family Medicine

## 2021-08-04 DIAGNOSIS — R159 Full incontinence of feces: Secondary | ICD-10-CM | POA: Diagnosis not present

## 2021-08-04 DIAGNOSIS — N8110 Cystocele, unspecified: Secondary | ICD-10-CM | POA: Diagnosis not present

## 2021-08-04 DIAGNOSIS — N816 Rectocele: Secondary | ICD-10-CM | POA: Diagnosis not present

## 2021-08-04 DIAGNOSIS — Z4689 Encounter for fitting and adjustment of other specified devices: Secondary | ICD-10-CM | POA: Diagnosis not present

## 2021-08-06 ENCOUNTER — Ambulatory Visit (HOSPITAL_COMMUNITY)
Admission: RE | Admit: 2021-08-06 | Discharge: 2021-08-06 | Disposition: A | Discharge status: Home / Self Care | Payer: Medicare Other | Source: Ambulatory Visit | Attending: Hematology | Admitting: Hematology

## 2021-08-06 DIAGNOSIS — C9110 Chronic lymphocytic leukemia of B-cell type not having achieved remission: Secondary | ICD-10-CM

## 2021-08-06 DIAGNOSIS — R918 Other nonspecific abnormal finding of lung field: Secondary | ICD-10-CM | POA: Diagnosis not present

## 2021-08-06 DIAGNOSIS — I700 Atherosclerosis of aorta: Secondary | ICD-10-CM | POA: Diagnosis not present

## 2021-08-12 ENCOUNTER — Other Ambulatory Visit: Payer: Self-pay

## 2021-08-12 DIAGNOSIS — C9110 Chronic lymphocytic leukemia of B-cell type not having achieved remission: Secondary | ICD-10-CM

## 2021-08-13 ENCOUNTER — Inpatient Hospital Stay: Payer: Medicare Other

## 2021-08-13 ENCOUNTER — Inpatient Hospital Stay: Payer: Medicare Other | Attending: Hematology | Admitting: Hematology

## 2021-08-13 ENCOUNTER — Other Ambulatory Visit: Payer: Self-pay

## 2021-08-13 VITALS — BP 155/69 | HR 97 | Temp 97.5°F | Resp 18 | Ht 62.0 in | Wt 115.2 lb

## 2021-08-13 DIAGNOSIS — C9110 Chronic lymphocytic leukemia of B-cell type not having achieved remission: Secondary | ICD-10-CM

## 2021-08-13 LAB — CMP (CANCER CENTER ONLY)
ALT: 13 U/L (ref 0–44)
AST: 21 U/L (ref 15–41)
Albumin: 4.2 g/dL (ref 3.5–5.0)
Alkaline Phosphatase: 72 U/L (ref 38–126)
Anion gap: 6 (ref 5–15)
BUN: 14 mg/dL (ref 8–23)
CO2: 27 mmol/L (ref 22–32)
Calcium: 9.9 mg/dL (ref 8.9–10.3)
Chloride: 98 mmol/L (ref 98–111)
Creatinine: 0.91 mg/dL (ref 0.44–1.00)
GFR, Estimated: >60 mL/min (ref >60)
Glucose, Bld: 97 mg/dL (ref 70–99)
Potassium: 4.4 mmol/L (ref 3.5–5.1)
Sodium: 131 mmol/L — ABNORMAL LOW (ref 135–145)
Total Bilirubin: 0.6 mg/dL (ref 0.3–1.2)
Total Protein: 6.4 g/dL — ABNORMAL LOW (ref 6.5–8.1)

## 2021-08-13 LAB — CBC WITH DIFFERENTIAL (CANCER CENTER ONLY)
Abs Immature Granulocytes: 0.03 10*3/uL (ref 0.00–0.07)
Basophils Absolute: 0.1 10*3/uL (ref 0.0–0.1)
Basophils Relative: 1 %
Eosinophils Absolute: 0.2 10*3/uL (ref 0.0–0.5)
Eosinophils Relative: 1 %
HCT: 36.2 % (ref 36.0–46.0)
Hemoglobin: 12.3 g/dL (ref 12.0–15.0)
Immature Granulocytes: 0 %
Lymphocytes Relative: 72 %
Lymphs Abs: 14.3 10*3/uL — ABNORMAL HIGH (ref 0.7–4.0)
MCH: 33.7 pg (ref 26.0–34.0)
MCHC: 34 g/dL (ref 30.0–36.0)
MCV: 99.2 fL (ref 80.0–100.0)
Monocytes Absolute: 2.1 10*3/uL — ABNORMAL HIGH (ref 0.1–1.0)
Monocytes Relative: 11 %
Neutro Abs: 3 10*3/uL (ref 1.7–7.7)
Neutrophils Relative %: 15 %
Platelet Count: 155 10*3/uL (ref 150–400)
RBC: 3.65 MIL/uL — ABNORMAL LOW (ref 3.87–5.11)
RDW: 12.7 % (ref 11.5–15.5)
Smear Review: NORMAL
WBC Count: 19.7 10*3/uL — ABNORMAL HIGH (ref 4.0–10.5)
nRBC: 0 % (ref 0.0–0.2)

## 2021-08-13 LAB — LACTATE DEHYDROGENASE: LDH: 135 U/L (ref 98–192)

## 2021-08-16 ENCOUNTER — Telehealth: Payer: Self-pay | Admitting: Hematology

## 2021-08-16 NOTE — Telephone Encounter (Signed)
Scheduled follow-up appointment per 5/12 los. Patient is aware. 

## 2021-08-17 ENCOUNTER — Other Ambulatory Visit: Payer: Self-pay | Admitting: Family Medicine

## 2021-08-20 NOTE — Progress Notes (Signed)
[name] Kitchen   HEMATOLOGY/ONCOLOGY CLINIC NOTE  Date of Service: 08/20/2021  Patient Care Team: [name], MD as PCP - General (Family Medicine) Ladene Artist, MD as Consulting Physician (Gastroenterology) Aloha Gell, MD as Consulting Physician (Obstetrics and Gynecology) Luberta Mutter, MD as Consulting Physician (Ophthalmology) [name], [facility] as Pharmacist (Pharmacist)  CHIEF COMPLAINTS/PURPOSE OF CONSULTATION:  Follow-up for continued evaluation and management of CLL  HISTORY OF PRESENTING ILLNESS:   Rebecca [name] is a wonderful 88 y.o. female who has been referred to [place] by Dr Leighton Ruff MD for evaluation and management of lymphocytosis.  Patient has a history of hypertension, GERD, hypothyroidism was noted to have biliary colic in November 7619 and referred for surgical evaluation due to persistent and worsening symptoms.  In the emergency room the patient was noted to have elevated LFTs and significant leukocytosis of 27.4k.  CT scan showed cholelithiasis, ultrasound abdomen showed cholelithiasis without acute cholecystitis.  MRCP showed distended CBD with choledocholithiasis.  Patient subsequently had ERCP on 12/24 with biliary sphincterotomy and subsequently had her laparoscopic cholecystectomy on 03/28/2021.  Surgical pathology from her cholecystectomy showed chronic cholecystitis without cholelithiasis. 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 Labs ordered 04/09/2021 showed persistent leukocytosis of 19.2k with lymphocyte count of 15.2k hemoglobin of 11.1 and platelets of 219k.  Patient was referred to [place] for further evaluation of CD5 positive lymphoproliferative disorder.  Patient notes no fevers no chills no night sweats. No new lumps or bumps. CT abdomen 04/15/2020 showed enlarged abdominal pelvic lymph nodes.  INTERVAL HISTORY  Patient is here for continued evaluation and management of her chronic  lymphocytic leukemia.  Patient notes no acute new symptoms since her last clinic visit.  No new lumps or bumps.  No fevers no chills no night sweats no unexpected weight loss. Labs done today were discussed in detail with the patient. Patient's recent CT of the chest was also discussed in detail.  MEDICAL HISTORY:  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[place])    Tubular adenoma of colon 05/2009    SURGICAL HISTORY: Past Surgical History:  Procedure Laterality Date   APPENDECTOMY     ARCUATE KERATECTOMY     BILIARY DILATION  03/27/2021   Procedure: BILIARY DILATION;  Surgeon: [name], MD;  Location: [facility];  Service: Gastroenterology;;   CATARACT EXTRACTION, BILATERAL  2015   CHOLECYSTECTOMY N/A 03/28/2021   Procedure: LAPAROSCOPIC CHOLECYSTECTOMY;  Surgeon: Leighton Ruff, MD;  Location: [facility];  Service: General;  Laterality: N/A;   double pallital tori bone removed      ERCP N/A 03/27/2021   Procedure: ENDOSCOPIC RETROGRADE CHOLANGIOPANCREATOGRAPHY (ERCP);  Surgeon: [name], MD;  Location: [facility];  Service: Gastroenterology;  Laterality: N/A;   HERNIA REPAIR     umbilical   KNEE SURGERY Bilateral    morton's neuroma on rt foot and platar facial release     MOUTH SURGERY     "bone shaved off roof of mouth"   REMOVAL OF STONES  03/27/2021   Procedure: REMOVAL OF STONES;  Surgeon: [name], MD;  Location: [facility];  Service: Gastroenterology;;   SALIVARY GLAND SURGERY     removal   SPHINCTEROTOMY  03/27/2021   Procedure: SPHINCTEROTOMY;  Surgeon: [name], MD;  Location: [facility];  Service: Gastroenterology;;   TONSILLECTOMY       SOCIAL HISTORY: Social History   Socioeconomic History   Marital status: Widowed    Spouse name: Not on file   Number of children: 3   Years of education: Not on file   Highest education level: Not on file  Occupational History   Occupation: Glass blower/designer    Comment: retired  Tobacco Use   Smoking status: Never   Smokeless tobacco: Never  Vaping Use   Vaping Use: Never used  Substance and Sexual Activity   Alcohol use: Yes    Alcohol/week: 0.0 standard drinks    Comment: rare   Drug use: No   Sexual activity: Yes  Other Topics Concern   Not on file  Social History Narrative   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   Lives alone on [address], 3 children, 7 grandchildren, 2 Designer, industrial/product, all local and supportive   Attends church   Enjoys reading, especially mysteries   Walks for exercise                     Social Determinants of Radio broadcast assistant Strain: Not on file  Food Insecurity: Not on file  Transportation Needs: Not on file  Physical Activity: Not on file  Stress: Not on file  Social Connections: Not on file  Intimate Partner Violence: Not on file    FAMILY HISTORY: Family History  Problem Relation Age of Onset   Diabetes Mother    Heart disease Mother    Hyperlipidemia Mother    Hypertension Mother    Anuerysm Father    Heart disease Father    Hypertension Father    Migraines Daughter    Kidney Stones Daughter    Colon polyps Sister    Lung cancer Sister    Colon polyps Brother    Diabetes Brother    Hyperlipidemia Brother    Hypertension Brother    Colon polyps Daughter    Breast cancer Daughter    Colon cancer Maternal Aunt        Rectal cancer   Cancer Daughter        salivary   Colon cancer Paternal Grandmother        with possible stomach cancer   Heart disease Brother    Crohn's disease Neg Hx    Pancreatic cancer Neg Hx     ALLERGIES:  is allergic to levaquin  [levofloxacin], sulfamethoxazole-trimethoprim, lactose intolerance (gi), tramadol hcl, augmentin [amoxicillin-pot clavulanate], ciprofloxacin, pneumococcal vaccine polyvalent, zosyn [piperacillin sod-tazobactam so], adhesive [tape], and codeine.  MEDICATIONS:  Current Outpatient Medications  Medication Sig Dispense Refill   ALPRAZolam (XANAX) 0.25 MG tablet TAKE 1 TABLET(0.25 MG) BY MOUTH AT BEDTIME AS NEEDED 20 tablet 0   ALPRAZolam (XANAX) 0.25 MG tablet TAKE 1 TABLET(0.25 MG) BY MOUTH AT BEDTIME AS NEEDED 30 tablet 0   Calcium Carb-Cholecalciferol (CALCIUM-VITAMIN D) 600-400 MG-UNIT TABS Take 1 tablet by mouth daily.     Cholecalciferol (VITAMIN D3) 2000 UNITS TABS Take 1 tablet by mouth daily.     diclofenac sodium (VOLTAREN) 1 % GEL Apply 2 g topically 4 (four) times daily. 1 Tube 1   esomeprazole (NEXIUM) 20 MG capsule  Take 20 mg by mouth 2 (two) times daily before a meal.     latanoprost (XALATAN) 0.005 % ophthalmic solution Place 1 drop into both eyes at bedtime.     levothyroxine (SYNTHROID) 88 MCG tablet TAKE 1 TABLET(88 MCG) BY MOUTH DAILY 90 tablet 0   levothyroxine (SYNTHROID) 88 MCG tablet Take 1 tablet (88 mcg total) by mouth daily before breakfast. 30 tablet 0   loperamide (IMODIUM) 2 MG capsule Take 2 capsules (4 mg total) by mouth every 6 (six) hours as needed for diarrhea or loose stools. 30 capsule 0   losartan (COZAAR) 100 MG tablet TAKE 1 TABLET BY MOUTH EVERY DAY 90 tablet 0   metoCLOPramide (REGLAN) 5 MG tablet TAKE 1 TABLET BY MOUTH TWICE DAILY WITH MEALS (Patient taking differently: Take 5 mg by mouth daily. TAKE 1 TABLET BY MOUTH TWICE DAILY WITH MEALS) 60 tablet 3   ondansetron (ZOFRAN-ODT) 4 MG disintegrating tablet DISSOLVE 1 TABLET(4 MG) ON THE TONGUE EVERY 8 HOURS AS NEEDED FOR NAUSEA OR VOMITING 12 tablet 0   rosuvastatin (CRESTOR) 20 MG tablet TAKE 1 TABLET(20 MG) BY MOUTH DAILY 30 tablet 5   timolol (TIMOPTIC) 0.5 % ophthalmic solution Place 1 drop into both eyes  2 (two) times daily.     vitamin B-12 (CYANOCOBALAMIN) 500 MCG tablet Take 500 mcg by mouth every morning.     vitamin C (ASCORBIC ACID) 500 MG tablet Take 500 mg by mouth daily.     No current facility-administered medications for this visit.    REVIEW OF SYSTEMS:   10 Point review of Systems was done is negative except as noted above.  PHYSICAL EXAMINATION:  .BP (!) 155/69 (BP Location: Left Arm, Patient Position: Sitting)   Pulse 97   Temp (!) 97.5 F (36.4 C) (Temporal)   Resp 18   Ht '5\' 2"'$  (1.575 m)   Wt 115 lb 3.2 oz (52.3 kg)   SpO2 100%   BMI 21.07 kg/m  NAD GENERAL:alert, in no acute distress and comfortable SKIN: no acute rashes, no significant lesions EYES: conjunctiva are pink and non-injected, sclera anicteric OROPHARYNX: MMM, no exudates, no oropharyngeal erythema or ulceration NECK: supple, no JVD LYMPH:  no palpable lymphadenopathy in the cervical, axillary or inguinal regions LUNGS: clear to auscultation b/l with normal respiratory effort HEART: regular rate & rhythm ABDOMEN:  normoactive bowel sounds , non tender, not distended. Extremity: no pedal edema PSYCH: alert & oriented x 3 with fluent speech NEURO: no focal motor/sensory deficits   LABORATORY DATA:  I have reviewed the data as listed     Latest Ref Rng & Units 08/13/2021   10:36 AM 05/05/2021   12:38 PM 04/09/2021   11:56 AM  CBC  WBC 4.0 - 10.5 K/uL 19.7   22.1   19.2 Repeated and verified X2.    Hemoglobin 12.0 - 15.0 g/dL 12.3   11.9   11.1    Hematocrit 36.0 - 46.0 % 36.2   35.8   33.4    Platelets 150 - 400 K/uL 155   141   219.0        Latest Ref Rng & Units 08/13/2021   10:36 AM 05/05/2021   12:38 PM 04/09/2021   11:56 AM  CMP  Glucose 70 - 99 mg/dL 97   109   96    BUN 8 - 23 mg/dL '14   13   6    '$ Creatinine 0.44 - 1.00 mg/dL 0.91   0.81  0.76    Sodium 135 - 145 mmol/L 131   131   135    Potassium 3.5 - 5.1 mmol/L 4.4   4.4   4.1    Chloride 98 - 111 mmol/L 98   99   101    CO2  22 - 32 mmol/L '27   26   28    '$ Calcium 8.9 - 10.3 mg/dL 9.9   10.1   10.1    Total Protein 6.5 - 8.1 g/dL 6.4   6.3   6.3    Total Bilirubin 0.3 - 1.2 mg/dL 0.6   0.6   0.7    Alkaline Phos 38 - 126 U/L 72   88   81    AST 15 - 41 U/L '21   22   23    '$ ALT 0 - 44 U/L '13   18   18     '$ Component     Latest Ref Rng & Units 05/05/2021  Hep B Core Total Ab     NON REACTIVE NON REACTIVE  Hepatitis B Surface Ag     NON REACTIVE NON REACTIVE  HCV Ab     NON REACTIVE NON REACTIVE  LDH     98 - 192 U/L 123   Surgical Pathology  CASE: [id number]  PATIENT: Rebecca [name]  Flow Pathology Report      Clinical history: CLL    DIAGNOSIS:   -  Monoclonal B-cell population with co-expression of CD5 comprises 80%  of all lymphocytes  -  See comment   COMMENT:   The phenotypic features are consistent with involvement by non-Hodgkin  B-cell lymphoma. Given the presence of CD5 expression, the differential  diagnosis includes chronic lymphocytic leukemia/small lymphocytic  lymphoma and mantle cell lymphoma.   PATHOLOGY SURGICAL PATHOLOGY   THIS IS AN ADDENDUM REPORT   CASE: [id number]  PATIENT: Rebecca [name]  Surgical Pathology Report  Addendum   Reason for Addendum #1:  Immunohistochemistry results   Clinical History: Acute Cholecystitis ([facility])      FINAL MICROSCOPIC DIAGNOSIS:   A. GALLBLADDER, CHOLECYSTECTOMY:  - Chronic cholecystitis, without cholelithiasis.  - Atypical CD5+ B cell infiltrate, most suggestive of involvement by  CLL/SLL.   COMMENT:   A. Given the presence of marked lymphocytic inflammation,  immunohistochemical studies were performed. Studies show a significant  predominance of CD20+ B cells, with aberrant co-expression of CD5 and  CD23. There are relatively fewer background CD3+ T cells. The features  are most suggestive of involvement by CLL/SLL. An immunohistochemical  study for Cyclin-D1 is pending, and will be reported as an addendum.   Recommend correlation with peripheral blood findings and evaluation for  other potential sites of adenopathy.     RADIOGRAPHIC STUDIES: I have personally reviewed the radiological images as listed and agreed with the findings in the report. CT Chest Wo Contrast  Result Date: 08/08/2021 CLINICAL DATA:  CLL/SLL staging * Tracking Code: BO * EXAM: CT CHEST WITHOUT CONTRAST TECHNIQUE: Multidetector CT imaging of the chest was performed following the standard protocol without IV contrast. RADIATION DOSE REDUCTION: This exam was performed according to the departmental dose-optimization program which includes automated exposure control, adjustment of the mA and/or kV according to patient size and/or use of iterative reconstruction technique. COMPARISON:  None Available. FINDINGS: Cardiovascular: Aortic atherosclerosis. Normal heart size. Three-vessel coronary artery calcifications. No pericardial effusion. Mediastinum/Nodes: Numerous prominent subcentimeter bilateral axillary and subpectoral lymph nodes (series 2, image 37, 52), as well as prominent  subcentimeter mediastinal and hilar lymph nodes (series 2, image 63). No overtly enlarged mediastinal, hilar, or axillary lymph nodes. Atrophic thyroid. Trachea, and esophagus demonstrate no significant findings. Lungs/Pleura: Unchanged 0.6 cm nodule of the dependent right lower lobe (series 5, image 93). Unchanged 1.0 x 0.6 cm nodule of the peripheral left lower lobe (series 5, image 83). No pleural effusion or pneumothorax. Upper Abdomen: No acute abnormality. Status post cholecystectomy. Multiple liver cysts. Benign, fat containing left adrenal adenoma (series 2, image 141). Musculoskeletal: No chest wall abnormality. No suspicious osseous lesions identified. Thoracolumbar scoliosis. IMPRESSION: 1. Numerous prominent subcentimeter bilateral axillary, subpectoral, and mediastinal lymph nodes, although nonspecific generally in keeping with reported diagnosis of  CLL/SLL. No overt lymphadenopathy in the chest. 2. Unchanged nodules of the bilateral lower lobes, nonspecific. Given initially established stability, future CT at 18-24 months (from initial initial baseline examination dated 03/25/2021) is considered optional for low-risk patients, but is recommended for high-risk patients. This recommendation follows the consensus statement: Guidelines for Management of Incidental Pulmonary Nodules Detected on CT Images: From the Fleischner Society 2017; Radiology 2017; 284:228-243. 3. Coronary artery disease. Aortic Atherosclerosis (ICD10-I70.0). Electronically Signed   By: Delanna Ahmadi M.D.   On: 08/08/2021 16:47    ASSESSMENT & PLAN:   88 year old female with recent laparoscopic cholecystectomy noted to have  1) recently diagnosed CLL/SLL with 13 q. and 11 q. Deletions. 2) CD5 positive lymphoid infiltrate in gallbladder pathology suggestive of involvement by CLL/SLL -CT abdomen was done recently on 03/25/2021-did not show significant lymphadenopathy or hepatosplenomegaly.  Noted to have some lung nodules in the lower lobes.  PLAN Patient's labs from today were discussed in detail with her. CBC shows hemoglobin of 12.3 with a WBC count of 19.7k platelet count of 155k CMP stable LDH 135 CT chest done on 08/06/2021 showed numerous subcentimeter bilateral axillary subpectoral and mediastinal lymph nodes.  No overt lymphadenopathy in the chest.  Unchanged nodules in bilateral lower lobes of the lung which are nonspecific.  3) .  Basal cell carcinoma on the right side of the nose Seeing Dr. Wilhemina Bonito at [facility] dermatology for management  -Continue active surveillance for recurrent skin cancers since the CLL especially with 13 q. deletion would be a risk factor.  4) Patient Active Problem List   Diagnosis Date Noted   Malnutrition of moderate degree 03/27/2021   Choledocholithiasis 03/26/2021   Cholangitis 03/26/2021   Cholelithiasis 03/25/2021    Elevated LFTs 03/25/2021   Chronic hyponatremia 03/25/2021   Bilateral primary osteoarthritis of knee 06/11/2019   Pain in left shoulder 05/22/2019   Unspecified vitamin D deficiency 12/25/2013   Atherosclerosis 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 04/20/2011   SPINAL STENOSIS, LUMBAR 03/09/2010   OTHER OSTEOPOROSIS 03/09/2010   SCOLIOSIS, LUMBAR SPINE 03/09/2010   MIGRAINE, OPHTHALMIC 10/29/2009   LUMBAR RADICULOPATHY, ATYPICAL 10/29/2009   POLYNEUROPATHY OTHER DISEASES CLASSIFIED ELSW 09/22/2009   UNSPECIFIED PERIPHERAL VASCULAR DISEASE 09/22/2009   ADENOMATOUS COLONIC POLYP 06/10/2009   History of iron deficiency 05/06/2009   CAROTID ARTERY DISEASE 07/28/2008   Allergic rhinitis 03/20/2008   DYSPHAGIA 03/12/2008   GANGLION OF TENDON SHEATH 08/17/2007   Hypothyroidism 11/15/2006   Hyperlipidemia 11/07/2006   ADVEF, DRUG/MEDICINAL/BIOLOGICAL SUBST NOS 11/07/2006   Essential hypertension 10/18/2006   GERD 10/18/2006   DIVERTICULOSIS, COLON 10/18/2006   -continue f/u with PCP for mx of other chronic medical comorbidities.  Follow-up Return to clinic with Dr. Irene Limbo with labs in 4 months   The total time spent  in the appointment was 20 minutes*.  All of the patient's questions were answered with apparent satisfaction. The patient knows to call the clinic with any problems, questions or concerns.   Sullivan Lone MD MS AAHIVMS [facility] Hematology/Oncology Physician [facility]  .*Total Encounter Time as defined by the Centers for Medicare and Medicaid Services includes, in addition to the face-to-face time of a patient visit (documented in the note above) non-face-to-face time: obtaining and reviewing outside history, ordering and reviewing medications, tests or procedures, care coordination (communications with other health care professionals or caregivers) and documentation in the medical record.

## 2021-08-26 ENCOUNTER — Telehealth: Payer: Self-pay

## 2021-08-26 ENCOUNTER — Telehealth: Payer: Self-pay | Admitting: Orthopaedic Surgery

## 2021-08-26 NOTE — Telephone Encounter (Signed)
Ok to pre cert-has been over 6 months

## 2021-08-26 NOTE — Telephone Encounter (Signed)
Patient called asked if she can get the gel injections again in her knees? The number to contact patient is [phone]

## 2021-08-26 NOTE — Telephone Encounter (Signed)
Notified patient that we have sent for approval.

## 2021-08-26 NOTE — Telephone Encounter (Signed)
Please precert for bilateral gel injections. Dr.Whitfield's patient.

## 2021-09-07 ENCOUNTER — Telehealth: Payer: Self-pay

## 2021-09-07 NOTE — Telephone Encounter (Signed)
VOB submitted for Orthoivsc, bilateral knee BV pending

## 2021-09-07 NOTE — Telephone Encounter (Signed)
Noted  

## 2021-09-08 ENCOUNTER — Telehealth: Payer: Self-pay | Admitting: Family Medicine

## 2021-09-08 DIAGNOSIS — R531 Weakness: Secondary | ICD-10-CM

## 2021-09-08 DIAGNOSIS — R29898 Other symptoms and signs involving the musculoskeletal system: Secondary | ICD-10-CM

## 2021-09-08 NOTE — Telephone Encounter (Addendum)
Pt is calling and would like to having physical therapy for her legs to strengthening her legs. Pt said md told her he can give her a referral

## 2021-09-08 NOTE — Progress Notes (Deleted)
Chronic Care Management Pharmacy Note  09/08/2021 Name:  Rebecca [name] MRN:  [id number] DOB:  02/19/1933  Summary: BP at goal < 140/90 per office readings LDL at goal < 70  Recommendations/Changes made from today's visit: -Recommended for patient to check BP regularly at home -Updated medication list and allergies per patient request  Plan: -Transfer PCP to Dr. Yong Channel  -Follow up with CPP in [place] office    Subjective: Rebecca [name] is an 88 y.o. year old female who is a primary patient of Rebecca [name].  The CCM team was consulted for assistance with disease management and care coordination needs.    Engaged with patient by telephone for follow up visit in response to provider referral for pharmacy case management and/or care coordination services.   Consent to Services:  The patient was given information about Chronic Care Management services, agreed to services, and gave verbal consent prior to initiation of services.  Please see initial visit note for detailed documentation.   Patient Care Team: Rebecca [name] as PCP - General (Family Medicine) Rebecca [name] as Consulting Physician (Gastroenterology) Rebecca [name] as Consulting Physician (Obstetrics and Gynecology) Rebecca [name] as Consulting Physician (Ophthalmology) Rebecca [name], [facility] as Pharmacist (Pharmacist)  Recent office visits: 05/18/21 Rebecca [name]: Patient presented for cough. Prescribed Zithromax x 5 days and consider OTC Mucinex.  04/26/21 Rebecca [name]: Patient presented for chronic conditions follow up. Prescribed ciclopirox cream for possible fungal infection.   04/09/21 Rebecca [name]: Patient presented for hospital follow up.  03/12/21 Rebecca [name]: Patient presented for RUQ abdominal pain.  02/17/21 Rebecca [name]: Patient presented for hypokalemia and follow up lab work.  02/10/21 Rebecca [name]: Patient  presented for jaundice. Plan for abdominal ultrasound and lab work.  Recent consult visits: 05/21/21 Rebecca [name] (oncology): Patient presented for CLL follow up. Plan for CT chest in 12 weeks.  05/05/21 Rebecca [name] (oncology): Patient presented for initial work up. Plan for lab work and consider diagnosis of CLL/SLL.  03/10/21 Rebecca [name] (dermatology): Patient presented for follow up. Unable to access notes.  03/01/21 Rebecca Gell (OBGYN): Patient presented for GYN exam. Unable to access notes.  01/12/21 Rebecca [name] (ortho): Patient presented for OA of both knees follow up. Hyaluronan injection #3 administered.  01/05/21 Rebecca [name] (ortho): Patient presented for OA of both knees follow up. Hyaluronan injection #2 administered.  12/29/20 Rebecca [name] (ortho): Patient presented for OA of both knees. Plan for 3 orthovisc injections. Follow up in 1 week for repeat injections.  12/23/20 Rebecca Mutter (ophthalmology): Patient presented for glaucoma. Unable to access notes.  Hospital visits: 12/22-12/29/22 Patient admitted to [facility] for cholelithiasis. No medication changes.   Objective:  Lab Results  Component Value Date   CREATININE 0.91 08/13/2021   BUN 14 08/13/2021   GFR 71.09 04/09/2021   GFRNONAA >60 08/13/2021   GFRAA >60 02/23/2016   NA 131 (L) 08/13/2021   K 4.4 08/13/2021   CALCIUM 9.9 08/13/2021   CO2 27 08/13/2021   GLUCOSE 97 08/13/2021    Lab Results  Component Value Date/Time   HGBA1C 5.6 07/26/2006 08:23 AM   GFR 71.09 04/09/2021 11:56 AM   GFR 60.51 02/17/2021 12:01 PM    Last diabetic Eye exam: No results found for: HMDIABEYEEXA  Last diabetic Foot exam: No results found for: HMDIABFOOTEX   Lab Results  Component Value Date  CHOL 132 08/21/2020   HDL 39.20 08/21/2020   LDLCALC 55 08/21/2020   LDLDIRECT 89.0 05/13/2020   TRIG 188.0 (H) 08/21/2020   CHOLHDL 3 08/21/2020       Latest Ref Rng &  Units 08/13/2021   10:36 AM 05/05/2021   12:38 PM 04/09/2021   11:56 AM  Hepatic Function  Total Protein 6.5 - 8.1 g/dL 6.4   6.3   6.3    Albumin 3.5 - 5.0 g/dL 4.2   4.2   4.0    AST 15 - 41 U/L 21   22   23     ALT 0 - 44 U/L 13   18   18     Alk Phosphatase 38 - 126 U/L 72   88   81    Total Bilirubin 0.3 - 1.2 mg/dL 0.6   0.6   0.7      Lab Results  Component Value Date/Time   TSH 0.766 03/26/2021 03:38 AM   TSH 0.73 03/20/2020 11:21 AM   TSH 2.24 08/16/2019 11:07 AM   FREET4 1.43 (H) 03/26/2021 03:38 AM   FREET4 0.92 04/28/2011 12:44 PM       Latest Ref Rng & Units 08/13/2021   10:36 AM 05/05/2021   12:38 PM 04/09/2021   11:56 AM  CBC  WBC 4.0 - 10.5 K/uL 19.7   22.1   19.2 Repeated and verified X2.    Hemoglobin 12.0 - 15.0 g/dL 12.3   11.9   11.1    Hematocrit 36.0 - 46.0 % 36.2   35.8   33.4    Platelets 150 - 400 K/uL 155   141   219.0      Lab Results  Component Value Date/Time   VD25OH 45 03/20/2020 11:21 AM   VD25OH 47.49 01/01/2016 09:55 AM   VD25OH 41.39 12/25/2013 09:39 AM    Clinical ASCVD: Yes  The ASCVD Risk score (Arnett DK, et al., 2019) failed to calculate for the following reasons:   The 2019 ASCVD risk score is only valid for ages 65 to 65       05/18/2021   10:26 AM 04/09/2021   11:05 AM 03/20/2020   10:19 AM  Depression screen PHQ 2/9  Decreased Interest 0 0 0  Down, Depressed, Hopeless 0 0 0  PHQ - 2 Score 0 0 0  Altered sleeping 0 0   Tired, decreased energy 0 3   Change in appetite 0 0   Feeling bad or failure about yourself  0 0   Trouble concentrating 0 0   Moving slowly or fidgety/restless 0 0   Suicidal thoughts 0 0   PHQ-9 Score 0 3      Social History   Tobacco Use  Smoking Status Never  Smokeless Tobacco Never   BP Readings from Last 3 Encounters:  08/13/21 (!) 155/69  05/18/21 120/60  05/05/21 (!) 152/64   Pulse Readings from Last 3 Encounters:  08/13/21 97  05/18/21 (!) 55  05/05/21 (!) 50   Wt Readings from Last 3  Encounters:  08/13/21 115 lb 3.2 oz (52.3 kg)  05/18/21 111 lb 1.6 oz (50.4 kg)  05/05/21 111 lb 4.8 oz (50.5 kg)   BMI Readings from Last 3 Encounters:  08/13/21 21.07 kg/m  05/18/21 20.32 kg/m  05/05/21 20.36 kg/m    Assessment/Interventions: Review of patient past medical history, allergies, medications, health status, including review of consultants reports, laboratory and other test data, was performed as part of comprehensive  evaluation and provision of chronic care management services.   SDOH:  (Social Determinants of Health) assessments and interventions performed: No  SDOH Screenings   Alcohol Screen: Not on file  Depression (PHQ2-9): Low Risk    PHQ-2 Score: 0  Financial Resource Strain: Not on file  Food Insecurity: Not on file  Housing: Not on file  Physical Activity: Not on file  Social Connections: Not on file  Stress: Not on file  Tobacco Use: Low Risk    Smoking Tobacco Use: Never   Smokeless Tobacco Use: Never   Passive Exposure: Not on file  Transportation Needs: Not on file    [place]  Allergies  Allergen Reactions   Levaquin [Levofloxacin] Other (See Comments)    Very nauseous and shakey   Sulfamethoxazole-Trimethoprim Nausea Only    shakey   Lactose Intolerance (Gi) Diarrhea and Other (See Comments)    Stomach hurts    Tramadol Hcl Nausea Only and Palpitations   Augmentin [Amoxicillin-Pot Clavulanate] Diarrhea   Ciprofloxacin Nausea Only    REACTION: Thrush, Nausea, Shakey   Pneumococcal Vaccine Polyvalent     REACTION: RED AND RAISED RASH ON ARM   Zosyn [Piperacillin Sod-Tazobactam So] Diarrhea   Adhesive [Tape] Rash   Codeine Nausea Only    Medications Reviewed Today     Reviewed by Ara Kussmaul, RN (Registered Nurse) on 08/13/21 at [place] List Status: <None>   Medication Order Taking? Sig Documenting Provider Last Dose Status Informant  ALPRAZolam (XANAX) 0.25 MG tablet [id number]  TAKE 1 TABLET(0.25 MG) BY MOUTH AT  BEDTIME AS NEEDED Rebecca [name]  Active   ALPRAZolam Duanne Moron) 0.25 MG tablet [id number]  TAKE 1 TABLET(0.25 MG) BY MOUTH AT BEDTIME AS NEEDED Laurey Morale, [name]  Active   Calcium Carb-Cholecalciferol (CALCIUM-VITAMIN D) 600-400 MG-UNIT TABS [id number] No Take 1 tablet by mouth daily. Provider, Historical, [name] Taking Active Self  Cholecalciferol (VITAMIN D3) 2000 UNITS TABS [id number] No Take 1 tablet by mouth daily. Provider, Historical, [name] Taking Active Self  diclofenac sodium (VOLTAREN) 1 % GEL [id number] No Apply 2 g topically 4 (four) times daily. Rebecca [name] Taking Active Self  esomeprazole (NEXIUM) 20 MG capsule [id number] No Take 20 mg by mouth 2 (two) times daily before a meal. Provider, Historical, [name] Taking Active Self  latanoprost (XALATAN) 0.005 % ophthalmic solution [id number] No Place 1 drop into both eyes at bedtime. Provider, Historical, [name] Taking Active Self           Med Note Maud Deed   BPJ Mar 27, 2021  7:13 PM)    levothyroxine (SYNTHROID) 88 MCG tablet [id number]  TAKE 1 TABLET(88 MCG) BY MOUTH DAILY Rebecca [name]  Active   levothyroxine (SYNTHROID) 88 MCG tablet [id number]  Take 1 tablet (88 mcg total) by mouth daily before breakfast. Laurey Morale, [name]  Active   loperamide (IMODIUM) 2 MG capsule [id number] No Take 2 capsules (4 mg total) by mouth every 6 (six) hours as needed for diarrhea or loose stools. Antonieta Pert, [name] Taking Active   losartan (COZAAR) 100 MG tablet [id number] No TAKE 1 TABLET BY MOUTH EVERY DAY Rebecca [name] Taking Active Self           Med Note Broadus John, [place]   Sat Mar 27, 2021  7:15 PM)    metoCLOPramide (REGLAN) 5 MG tablet [id number] No TAKE 1 TABLET BY MOUTH TWICE DAILY WITH MEALS  Patient taking differently: Take 5 mg  by mouth daily. TAKE 1 TABLET BY MOUTH TWICE DAILY WITH MEALS   Rebecca [name] Taking Active Self           Med Note Kipp Brood, MADELINE G   Fri May 28, 2021  2:19 PM)    Discontinued 07/28/11 1010  (Reorder)   ondansetron (ZOFRAN-ODT) 4 MG disintegrating tablet [id number] No DISSOLVE 1 TABLET(4 MG) ON THE TONGUE EVERY 8 HOURS AS NEEDED FOR NAUSEA OR VOMITING Rebecca [name] Taking Active Self           Med Note Maud Deed   JQG Mar 27, 2021  7:16 PM)    rosuvastatin (CRESTOR) 20 MG tablet [id number] No TAKE 1 TABLET(20 MG) BY MOUTH DAILY Marin Olp, [name] Taking Active   timolol (TIMOPTIC) 0.5 % ophthalmic solution [id number] No Place 1 drop into both eyes 2 (two) times daily. Provider, Historical, [name] Taking Active Self           Med Note Maud Deed   QDI Mar 27, 2021  7:14 PM)    vitamin B-12 (CYANOCOBALAMIN) 500 MCG tablet [id number] No Take 500 mcg by mouth every morning. Provider, Historical, [name] Taking Active Self  vitamin C (ASCORBIC ACID) 500 MG tablet [id number] No Take 500 mg by mouth daily. Provider, Historical, [name] Taking Active Self            Patient Active Problem List   Diagnosis Date Noted   Malnutrition of moderate degree 03/27/2021   Choledocholithiasis 03/26/2021   Cholangitis 03/26/2021   Cholelithiasis 03/25/2021   Elevated LFTs 03/25/2021   Chronic hyponatremia 03/25/2021   Bilateral primary osteoarthritis of knee 06/11/2019   Pain in left shoulder 05/22/2019   Unspecified vitamin D deficiency 12/25/2013   Atherosclerosis 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 04/20/2011   SPINAL STENOSIS, LUMBAR 03/09/2010   OTHER OSTEOPOROSIS 03/09/2010   SCOLIOSIS, LUMBAR SPINE 03/09/2010   MIGRAINE, OPHTHALMIC 10/29/2009   LUMBAR RADICULOPATHY, ATYPICAL 10/29/2009   POLYNEUROPATHY OTHER DISEASES CLASSIFIED ELSW 09/22/2009   UNSPECIFIED PERIPHERAL VASCULAR DISEASE 09/22/2009   ADENOMATOUS COLONIC POLYP 06/10/2009   History of iron deficiency 05/06/2009   CAROTID ARTERY DISEASE 07/28/2008   Allergic rhinitis 03/20/2008   DYSPHAGIA 03/12/2008   GANGLION OF TENDON SHEATH 08/17/2007   Hypothyroidism 11/15/2006    Hyperlipidemia 11/07/2006   ADVEF, DRUG/MEDICINAL/BIOLOGICAL SUBST NOS 11/07/2006   Essential hypertension 10/18/2006   GERD 10/18/2006   DIVERTICULOSIS, COLON 10/18/2006    Immunization History  Administered Date(s) Administered   Fluad Quad(high Dose 65+) 12/31/2018, 01/06/2021   H1N1 06/02/2008   Influenza Split 12/29/2010   Influenza Whole 04/04/2001, 02/01/2007, 12/24/2007, 12/16/2008, 01/15/2009, 01/05/2010   Influenza, High Dose Seasonal PF 12/31/2014, 01/01/2016, 01/11/2017, 01/02/2018   Influenza,inj,Quad PF,6+ Mos 12/19/2012, 12/25/2013   Influenza-Unspecified 02/10/2020   PFIZER(Purple Top)SARS-COV-2 Vaccination 05/06/2019, 05/27/2019, 02/10/2020, 01/10/2021   Pneumococcal Polysaccharide-23 03/05/1999   Td 04/05/1999, 07/28/2008   Zoster Recombinat (Shingrix) 08/04/2016, 10/12/2016   Zoster, Live 10/24/2007    Conditions to be addressed/monitored:  Hypertension, Hyperlipidemia, GERD, Hypothyroidism, Anxiety, Osteopenia and Neuropathy  Conditions addressed this visit: Hypertension, hyperlipidemia  There are no care plans that you recently modified to display for this patient.      Medication Assistance: None required.  Patient affirms current coverage meets needs.  Compliance/Adherence/Medication fill history: Care Gaps: Tetanus BP- 120/60 (05/18/21)  Star-Rating Drugs: Losartan 100 mg - Last filled 08/17/21 90 DS at [facility] Rosuvastatin 20 mg - Last filled 06/25/21 90ds  Patient's preferred pharmacy is:  [facility] [place], [place] [address] Phone: [phone] Fax: [place], [place] - [address] [facility] [place] [address] Phone: [phone] Fax: [phone]  Uses pill box? No - in medicine cabinet Pt endorses 100% compliance  We discussed: Current pharmacy is  preferred with insurance plan and patient is satisfied with pharmacy services Patient decided to: Continue current medication management strategy  Care Plan and Follow Up Patient Decision:  Patient agrees to Care Plan and Follow-up.  Plan: The patient has been provided with contact information for the care management team and has been advised to call with any health related questions or concerns.   Jeni Salles, PharmD [facility] Clinical Pharmacist [place] [facility] [place]   Current Barriers:  Unable to independently monitor therapeutic efficacy  Pharmacist Clinical Goal(s):  Patient will achieve adherence to monitoring guidelines and medication adherence to achieve therapeutic efficacy through collaboration with PharmD and provider.   Interventions: 1:1 collaboration with Garret Reddish regarding development and update of comprehensive plan of care as evidenced by provider attestation and co-signature Inter-disciplinary care team collaboration (see longitudinal plan of care) Comprehensive medication review performed; medication list updated in electronic medical record  Hypertension (BP goal <140/90) -Controlled -Current treatment: Losartan 100 mg 1 tablet daily - Appropriate, Effective, Safe, Accessible -Medications previously tried: none  -Current home readings: not checking -Current dietary habits: limits salt intake -Current exercise habits: limited -Denies hypotensive/hypertensive symptoms -Educated on BP goals and benefits of medications for prevention of heart attack, stroke and kidney damage; Importance of home blood pressure monitoring; Proper BP monitoring technique; -Counseled to monitor BP at home weekly, document, and provide log at future appointments -Counseled on diet and exercise extensively Recommended to continue current medication  Hyperlipidemia: (LDL goal < 70) -Controlled -Current treatment: Rosuvastatin 20 tablet daily -  Appropriate, Effective, Safe, Accessible -Medications previously tried: simvastatin (myalgias) -Current dietary patterns: did not discuss -Current exercise habits: limited -Educated on Cholesterol goals;  Benefits of statin for ASCVD risk reduction; Importance of limiting foods high in cholesterol; Exercise goal of 150 minutes per week; -Counseled on diet and exercise extensively Recommended to continue current medication  Anxiety (Goal: minimize symptoms) -Controlled -Current treatment: Alprazolam 0.25 mg 1 tablet at bedtime as needed - Appropriate, Effective, Safe, Accessible -Medications previously tried/failed: none -GAD7: n/a -Educated on Benefits of medication for symptom control -Recommended to continue current medication Counseled on continued use of alprazolam sparingly due to long term risks  Osteopenia (Goal prevent fractures) -Controlled -Last DEXA Scan: not able to access records from OBGYN   T-Score femoral neck: n/a  T-Score total hip: n/a  T-Score lumbar spine: n/a  T-Score forearm radius: n/a  10-year probability of major osteoporotic fracture: n/a  10-year probability of hip fracture: n/a -Patient is not a candidate for pharmacologic treatment -Current treatment  Vitamin D 2000 units 1 tablet daily - Appropriate, Effective, Safe, Accessible Calcium carbonate-vit D 600-200 mg-unit 1 tablet 2 times daily - Appropriate, Effective, Safe, Accessible -Medications previously tried: Reclast (finished 5 years of treatment) -Recommend [phone] units of vitamin D daily. Recommend 1200 mg of calcium daily from dietary and supplemental sources. Recommend weight-bearing and muscle strengthening exercises for building and maintaining bone density. -Counseled on diet and exercise extensively  Neuropathy (Goal: minimize symptoms) -Controlled -Current treatment  No medications -Medications previously tried:  gabapentin -Recommended to continue current medication  GERD  (Goal: minimize symptoms) -Controlled -Current treatment  Esomeprazole 20 mg 1 capsule twice daily - Appropriate, Effective, Safe, Accessible Metoclopramide 5 mg 1 tablet daily (written as twice daily) - Appropriate, Effective, Safe, Accessible -Medications previously tried: none  -Counseled on diet and exercise extensively Recommended to continue current medication  Eye medications (Goal: minimize symptoms) -Controlled -Current treatment  Latanoprost 0.005% ophthalmic solution place 1 drop in both eyes at bedtime - Appropriate, Effective, Safe, Accessible Timolol 0.5% ophthalmic solution place 1 drop in both eyes twice daily Appropriate, Effective, Safe, Accessible -Medications previously tried: Administrator (cost) -Recommended to continue current medication   Health Maintenance -Vaccine gaps: tetanus (too expensive), Prevnar -Current therapy:  Vitamin B 12 500 mcg 1 tablets daily Diclofenac gel 1% as needed Vitamin C 500 mg 1 tablet daily -Educated on Cost vs benefit of each product must be carefully weighed by individual consumer -Patient is satisfied with current therapy and denies issues -Recommended to continue current medication  Patient Goals/Self-Care Activities Patient will:  - take medications as prescribed check blood pressure weekly, document, and provide at future appointments target a minimum of 150 minutes of moderate intensity exercise weekly  Follow Up Plan: The patient has been provided with contact information for the care management team and has been advised to call with any health related questions or concerns.

## 2021-09-08 NOTE — Telephone Encounter (Signed)
I spoke with the pt and she stated that PT want a visit with Dr. Yong Channel before she can be seen by them and that appointment with Dr. Yong Channel is not until the end of this month. Pt requested a referral to [facility] health rehab on [address] in [place].

## 2021-09-09 NOTE — Telephone Encounter (Signed)
Spoke with the patient and informed her the PT order was entered as below and someone will contact her with appt info.

## 2021-09-13 ENCOUNTER — Telehealth: Payer: Self-pay | Admitting: Family Medicine

## 2021-09-13 NOTE — Telephone Encounter (Signed)
Sciatica pain and right foot numbness, requesting a prescription for prednisone. Offered ov, declined hoping she could get the script without an appointment

## 2021-09-14 NOTE — Telephone Encounter (Signed)
Pt has been added to Dr. Jerilee Hoh schedule for 6/15

## 2021-09-16 ENCOUNTER — Encounter: Payer: Self-pay | Admitting: Internal Medicine

## 2021-09-16 ENCOUNTER — Ambulatory Visit: Payer: Medicare Other | Attending: Family Medicine | Admitting: Physical Therapy

## 2021-09-16 ENCOUNTER — Ambulatory Visit (INDEPENDENT_AMBULATORY_CARE_PROVIDER_SITE_OTHER): Payer: Medicare Other | Admitting: Internal Medicine

## 2021-09-16 ENCOUNTER — Encounter: Payer: Self-pay | Admitting: Physical Therapy

## 2021-09-16 VITALS — BP 130/68 | HR 41 | Temp 97.9°F | Wt 117.0 lb

## 2021-09-16 DIAGNOSIS — M545 Low back pain, unspecified: Secondary | ICD-10-CM

## 2021-09-16 DIAGNOSIS — M79604 Pain in right leg: Secondary | ICD-10-CM

## 2021-09-16 DIAGNOSIS — M25552 Pain in left hip: Secondary | ICD-10-CM | POA: Insufficient documentation

## 2021-09-16 DIAGNOSIS — R29898 Other symptoms and signs involving the musculoskeletal system: Secondary | ICD-10-CM | POA: Diagnosis not present

## 2021-09-16 DIAGNOSIS — G8929 Other chronic pain: Secondary | ICD-10-CM | POA: Diagnosis not present

## 2021-09-16 DIAGNOSIS — M5441 Lumbago with sciatica, right side: Secondary | ICD-10-CM | POA: Insufficient documentation

## 2021-09-16 DIAGNOSIS — R262 Difficulty in walking, not elsewhere classified: Secondary | ICD-10-CM | POA: Diagnosis not present

## 2021-09-16 DIAGNOSIS — M6281 Muscle weakness (generalized): Secondary | ICD-10-CM | POA: Diagnosis not present

## 2021-09-16 MED ORDER — PREDNISONE 10 MG (21) PO TBPK: ORAL_TABLET | ORAL | 0 refills | Status: DC

## 2021-09-16 NOTE — Progress Notes (Signed)
Established Patient Office Visit     CC/Reason for Visit: Right low back pain and right foot numbness  HPI: Rebecca [name] is a 88 y.o. female who is coming in today for the above mentioned reasons.  She is followed by Dr. Ellene Route for a "bad back, trapped L4-L5 nerve".  She manages with frequent epidural injections.  About a week ago she was cleaning a lower kitchen cabinet that had some water damage with a lot of bending over.  Since then she has developed what she recognizes is right-sided sciatica with right foot numbness.  She has had prednisone before that has helped and she is hoping to get the same today.  She is already doing PT that she does not find particularly helpful.  As soon as her current issue began she scheduled an appointment with Dr. Ellene Route however he has no availability until August.    She wants [place] to know that she stopped her statin due to myalgias.  Past Medical/Surgical History: 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[place])    Tubular adenoma of colon 05/2009    Past Surgical History:  Procedure Laterality Date   APPENDECTOMY     ARCUATE KERATECTOMY     BILIARY DILATION  03/27/2021   Procedure: BILIARY DILATION;  Surgeon: [name], MD;  Location: [facility];  Service: Gastroenterology;;   CATARACT EXTRACTION, BILATERAL  2015   CHOLECYSTECTOMY N/A 03/28/2021   Procedure: LAPAROSCOPIC CHOLECYSTECTOMY;  Surgeon: Leighton Ruff, MD;  Location: [facility];  Service: General;  Laterality: N/A;   double pallital tori bone removed      ERCP N/A 03/27/2021   Procedure: ENDOSCOPIC RETROGRADE CHOLANGIOPANCREATOGRAPHY (ERCP);  Surgeon: [name], MD;  Location:  [facility];  Service: Gastroenterology;  Laterality: N/A;   HERNIA REPAIR     umbilical   KNEE SURGERY Bilateral    morton's neuroma on rt foot and platar facial release     MOUTH SURGERY     "bone shaved off roof of mouth"   REMOVAL OF STONES  03/27/2021   Procedure: REMOVAL OF STONES;  Surgeon: [name], MD;  Location: [facility];  Service: Gastroenterology;;   SALIVARY GLAND SURGERY     removal   SPHINCTEROTOMY  03/27/2021   Procedure: SPHINCTEROTOMY;  Surgeon: [name], MD;  Location: [facility];  Service: Gastroenterology;;   TONSILLECTOMY      Social History:  reports that she has never smoked. She has never used smokeless tobacco. She reports current alcohol use. She reports that she does not use drugs.  Allergies: Allergies  Allergen Reactions   Levaquin [Levofloxacin] Other (See Comments)    Very nauseous and shakey   Sulfamethoxazole-Trimethoprim Nausea Only    shakey   Lactose Intolerance (Gi) Diarrhea and Other (See Comments)    Stomach hurts    Tramadol Hcl Nausea Only and Palpitations   Augmentin [Amoxicillin-Pot Clavulanate] Diarrhea   Ciprofloxacin Nausea Only    REACTION: Thrush, Nausea, Shakey   Pneumococcal Vaccine Polyvalent     REACTION: RED AND RAISED RASH ON ARM   Zosyn [  Piperacillin Sod-Tazobactam So] Diarrhea   Adhesive [Tape] Rash   Codeine Nausea Only    Family History:  Family History  Problem Relation Age of Onset   Diabetes Mother    Heart disease Mother    Hyperlipidemia Mother    Hypertension Mother    Anuerysm Father    Heart disease Father    Hypertension Father    Migraines Daughter    Kidney Stones Daughter    Colon polyps Sister    Lung cancer Sister    Colon polyps Brother    Diabetes Brother    Hyperlipidemia Brother    Hypertension Brother    Colon polyps Daughter    Breast cancer Daughter    Colon cancer Maternal Aunt        Rectal cancer   Cancer Daughter        salivary   Colon cancer  Paternal Grandmother        with possible stomach cancer   Heart disease Brother    Crohn's disease Neg Hx    Pancreatic cancer Neg Hx      Current Outpatient Medications:    ALPRAZolam (XANAX) 0.25 MG tablet, TAKE 1 TABLET(0.25 MG) BY MOUTH AT BEDTIME AS NEEDED, Disp: 20 tablet, Rfl: 0   ALPRAZolam (XANAX) 0.25 MG tablet, TAKE 1 TABLET(0.25 MG) BY MOUTH AT BEDTIME AS NEEDED, Disp: 30 tablet, Rfl: 0   Calcium Carb-Cholecalciferol (CALCIUM-VITAMIN D) 600-400 MG-UNIT TABS, Take 1 tablet by mouth daily., Disp: , Rfl:    Cholecalciferol (VITAMIN D3) 2000 UNITS TABS, Take 1 tablet by mouth daily., Disp: , Rfl:    diclofenac sodium (VOLTAREN) 1 % GEL, Apply 2 g topically 4 (four) times daily., Disp: 1 Tube, Rfl: 1   esomeprazole (NEXIUM) 20 MG capsule, Take 20 mg by mouth 2 (two) times daily before a meal., Disp: , Rfl:    latanoprost (XALATAN) 0.005 % ophthalmic solution, Place 1 drop into both eyes at bedtime., Disp: , Rfl:    levothyroxine (SYNTHROID) 88 MCG tablet, TAKE 1 TABLET(88 MCG) BY MOUTH DAILY, Disp: 90 tablet, Rfl: 0   levothyroxine (SYNTHROID) 88 MCG tablet, Take 1 tablet (88 mcg total) by mouth daily before breakfast., Disp: 30 tablet, Rfl: 0   loperamide (IMODIUM) 2 MG capsule, Take 2 capsules (4 mg total) by mouth every 6 (six) hours as needed for diarrhea or loose stools., Disp: 30 capsule, Rfl: 0   losartan (COZAAR) 100 MG tablet, TAKE 1 TABLET BY MOUTH EVERY DAY, Disp: 90 tablet, Rfl: 0   metoCLOPramide (REGLAN) 5 MG tablet, TAKE 1 TABLET BY MOUTH TWICE DAILY WITH MEALS (Patient taking differently: Take 5 mg by mouth daily. TAKE 1 TABLET BY MOUTH TWICE DAILY WITH MEALS), Disp: 60 tablet, Rfl: 3   ondansetron (ZOFRAN-ODT) 4 MG disintegrating tablet, DISSOLVE 1 TABLET(4 MG) ON THE TONGUE EVERY 8 HOURS AS NEEDED FOR NAUSEA OR VOMITING, Disp: 12 tablet, Rfl: 0   predniSONE (STERAPRED UNI-PAK 21 TAB) 10 MG (21) TBPK tablet, Take as directed, Disp: 21 tablet, Rfl: 0   rosuvastatin  (CRESTOR) 20 MG tablet, TAKE 1 TABLET(20 MG) BY MOUTH DAILY, Disp: 30 tablet, Rfl: 5   timolol (TIMOPTIC) 0.5 % ophthalmic solution, Place 1 drop into both eyes 2 (two) times daily., Disp: , Rfl:    vitamin B-12 (CYANOCOBALAMIN) 500 MCG tablet, Take 500 mcg by mouth every morning., Disp: , Rfl:    vitamin C (ASCORBIC ACID) 500 MG tablet, Take 500 mg by mouth daily., Disp: , Rfl:  Review of Systems:  Constitutional: Denies fever, chills, diaphoresis, appetite change and fatigue.  HEENT: Denies photophobia, eye pain, redness, hearing loss, ear pain, congestion, sore throat, rhinorrhea, sneezing, mouth sores, trouble swallowing, neck pain, neck stiffness and tinnitus.   Respiratory: Denies SOB, DOE, cough, chest tightness,  and wheezing.   Cardiovascular: Denies chest pain, palpitations and leg swelling.  Gastrointestinal: Denies nausea, vomiting, abdominal pain, diarrhea, constipation, blood in stool and abdominal distention.  Genitourinary: Denies dysuria, urgency, frequency, hematuria, flank pain and difficulty urinating.  Endocrine: Denies: hot or cold intolerance, sweats, changes in hair or nails, polyuria, polydipsia. Musculoskeletal: Positive for myalgias, back pain, joint swelling, arthralgias and gait problem.  Skin: Denies pallor, rash and wound.  Neurological: Denies dizziness, seizures, syncope, weakness, light-headedness, numbness and headaches.  Hematological: Denies adenopathy. Easy bruising, personal or family bleeding history  Psychiatric/Behavioral: Denies suicidal ideation, mood changes, confusion, nervousness, sleep disturbance and agitation    Physical Exam: Vitals:   09/16/21 1102  BP: 130/68  Pulse: (!) 41  Temp: 97.9 F (36.6 C)  TempSrc: Oral  SpO2: 99%  Weight: 117 lb (53.1 kg)    Body mass index is 21.4 kg/m.   Constitutional: NAD, calm, comfortable ambulates with a cane Eyes: PERRL, lids and conjunctivae normal ENMT: Mucous membranes are moist.   Respiratory: clear to auscultation bilaterally, no wheezing, no crackles. Normal respiratory effort. No accessory muscle use.  Psychiatric: Normal judgment and insight. Alert and oriented x 3. Normal mood.    Impression and Plan:  Low back pain radiating to right leg  - Plan: predniSONE (STERAPRED UNI-PAK 21 TAB) 10 MG (21) TBPK tablet -I think reasonable to prescribe steroid pack, advised to continue PT, she already has appointment with her neurosurgeon in the next few months.    Time spent:23 minutes reviewing chart, interviewing and examining patient and formulating plan of care.     Lelon Frohlich, MD [facility]

## 2021-09-16 NOTE — Therapy (Signed)
OUTPATIENT PHYSICAL THERAPY LOWER EXTREMITY EVALUATION   Patient Name: Rebecca [name] MRN: [id number] DOB:Sep 29, 1932, 88 y.o., female Today's Date: 09/16/2021   PT End of Session - 09/16/21 1640     Visit Number 1    Date for PT Re-Evaluation 11/25/21    PT Start Time 1401    PT Stop Time 6063    PT Time Calculation (min) 40 min    Activity Tolerance Patient tolerated treatment well    Behavior During Therapy [facility] for tasks assessed/performed             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[place])    Tubular adenoma of colon 05/2009   Past Surgical History:  Procedure Laterality Date   APPENDECTOMY     ARCUATE KERATECTOMY     BILIARY DILATION  03/27/2021   Procedure: BILIARY DILATION;  Surgeon: [name], MD;  Location: [facility];  Service: Gastroenterology;;   CATARACT EXTRACTION, BILATERAL  2015   CHOLECYSTECTOMY N/A 03/28/2021   Procedure: LAPAROSCOPIC CHOLECYSTECTOMY;  Surgeon: Leighton Ruff, MD;  Location: [facility];  Service: General;  Laterality: N/A;   double pallital tori bone removed      ERCP N/A 03/27/2021   Procedure: ENDOSCOPIC RETROGRADE CHOLANGIOPANCREATOGRAPHY (ERCP);  Surgeon: [name], MD;  Location: [facility];  Service: Gastroenterology;  Laterality: N/A;   HERNIA REPAIR     umbilical   KNEE SURGERY Bilateral    morton's neuroma on rt foot and platar facial release     MOUTH SURGERY     "bone shaved off roof of mouth"   REMOVAL OF STONES  03/27/2021   Procedure: REMOVAL OF STONES;  Surgeon: [name], MD;  Location: [facility];  Service: Gastroenterology;;   SALIVARY GLAND SURGERY     removal   SPHINCTEROTOMY  03/27/2021    Procedure: SPHINCTEROTOMY;  Surgeon: [name], MD;  Location: [facility];  Service: Gastroenterology;;   TONSILLECTOMY     Patient Active Problem List   Diagnosis Date Noted   Malnutrition of moderate degree 03/27/2021   Choledocholithiasis 03/26/2021   Cholangitis 03/26/2021   Cholelithiasis 03/25/2021   Elevated LFTs 03/25/2021   Chronic hyponatremia 03/25/2021   Bilateral primary osteoarthritis of knee 06/11/2019   Pain in left shoulder 05/22/2019   Unspecified vitamin D deficiency 12/25/2013   Atherosclerosis 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 04/20/2011   SPINAL STENOSIS, LUMBAR 03/09/2010   OTHER OSTEOPOROSIS 03/09/2010   SCOLIOSIS, LUMBAR SPINE 03/09/2010   MIGRAINE, OPHTHALMIC 10/29/2009   LUMBAR RADICULOPATHY, ATYPICAL 10/29/2009   POLYNEUROPATHY OTHER DISEASES CLASSIFIED ELSW 09/22/2009   UNSPECIFIED PERIPHERAL VASCULAR DISEASE 09/22/2009   ADENOMATOUS COLONIC POLYP 06/10/2009   History of iron deficiency 05/06/2009   CAROTID ARTERY DISEASE 07/28/2008   Allergic rhinitis 03/20/2008   DYSPHAGIA 03/12/2008   GANGLION OF TENDON SHEATH 08/17/2007   Hypothyroidism 11/15/2006   Hyperlipidemia 11/07/2006   ADVEF, DRUG/MEDICINAL/BIOLOGICAL SUBST NOS 11/07/2006   Essential hypertension 10/18/2006   GERD 10/18/2006   DIVERTICULOSIS, COLON 10/18/2006    PCP: [name], MD   REFERRING PROVIDER: [name],  MD   REFERRING DIAG: R29.898 (ICD-10-CM) - Weakness of both lower extremities   THERAPY DIAG:  Pain in left hip  Muscle weakness (generalized)  Difficulty in walking, not elsewhere classified  Chronic bilateral low back pain with right-sided sciatica  Rationale for Evaluation and Treatment Rehabilitation  ONSET DATE: 09/09/2021   SUBJECTIVE:   SUBJECTIVE STATEMENT: Patient reports S/P 2 surgeries at Christmas. She also was taking Crestor and feels this made her legs feel jiggly. She has had to use a  cane since all of this happened, and feels like she stays bent over. She had a broken hip and received PT ending 1/22. At that time she did still use the cane sometimes, but now uses it all the time. She has also recently developed sciatica and pain and numbness down her RLE.  PERTINENT HISTORY: L hip fracture. L4, L5 with nerve impingement on the R, per patient. 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 No active treatment.  PAIN:  Are you having pain? Yes: NPRS scale: 4/10 Pain location: R hip and foot Pain description: pain in hip, numbness in foot Aggravating factors: activity, bending Relieving factors: heating pad, avoid ben=ding  PRECAUTIONS: None  WEIGHT BEARING RESTRICTIONS No  FALLS:  Has patient fallen in last 6 months? No  LIVING ENVIRONMENT: Lives with: lives with their family and lives alone Lives in: House/apartment Stairs: Yes: External: 1 steps; none Has following equipment at home: Single point cane  OCCUPATION: retired  PLOF: Independent  PATIENT GOALS Walk straight, preferably without a cane   OBJECTIVE:   DIAGNOSTIC FINDINGS: N/A  COGNITION:  Overall cognitive status: Within functional limits for tasks assessed     SENSATION: Not tested  EDEMA: None   MUSCLE LENGTH: Hamstrings: Right Approx 75 degrees Thomas test: Slightly beyond neutral B.  POSTURE: forward head, decreased lumbar lordosis, decreased thoracic kyphosis, flexed trunk , and Scoliosis with rib hump on L, R paraspinal hump at lumbar region, R shoulder significantly lower than L  PALPATION: Tightness noted throughout paraspinals. TTP medial L scapular border, L gluts.  LOWER EXTREMITY ROM: BLE ROM generally WFL, but R hip mildly diminished and painful.      LOWER EXTREMITY MMT:  MMT Right eval Left eval  Hip flexion 3+ 4  Hip extension 3 4-  Hip abduction 3 4-  Hip adduction    Hip internal rotation    Hip  external rotation    Knee flexion 3+ 4  Knee extension 4- 4  Ankle dorsiflexion 4 4  Ankle plantarflexion    Ankle inversion    Ankle eversion     (Blank rows = not tested)    FUNCTIONAL TESTS:  5 times sit to stand: 22.00 Timed up and go (TUG): 19.28  GAIT: Distance walked: 61' Assistive device utilized: Single point cane Level of assistance: Modified independence Comments: Small steps, shuffling, slow steps, patient reports she feels unsteady.    TODAY'S TREATMENT: Patient education   PATIENT EDUCATION:  Education details: POC Person educated: Patient Education method: Explanation Education comprehension: verbalized understanding   HOME EXERCISE PROGRAM: TBD  ASSESSMENT:  CLINICAL IMPRESSION: Patient is a 88 y.o. who was seen today for physical therapy evaluation and treatment for low back pain and R LE pain and numbness, BLE weakness. She also demonstrates decreased balance, decreased safety and I with functional mobility. Has appointments to see both ortho for knees and neuro for back, for possible injections, but could not schedule  until July. She will benefit from PT to address her weakness, flexed posture, decreased ROM, in order to improve her balance and functional mobility and decrease pain.   OBJECTIVE IMPAIRMENTS Abnormal gait, decreased activity tolerance, decreased balance, decreased coordination, decreased mobility, difficulty walking, decreased ROM, decreased strength, impaired flexibility, improper body mechanics, postural dysfunction, and pain.   ACTIVITY LIMITATIONS carrying, lifting, bending, standing, squatting, sleeping, stairs, reach over head, and locomotion level  PARTICIPATION LIMITATIONS: meal prep, cleaning, laundry, shopping, and yard work  PERSONAL FACTORS Age, Past/current experiences, and Time since onset of injury/illness/exacerbation are also affecting patient's functional outcome.   REHAB POTENTIAL: Good  CLINICAL DECISION  MAKING: Stable/uncomplicated  EVALUATION COMPLEXITY: Low   GOALS: Goals reviewed with patient? Yes  SHORT TERM GOALS: Target date: 10/14/2021  I with basic HEP Baseline: Goal status: INITIAL  LONG TERM GOALS: Target date: 11/25/2021   I with final HEP Baseline:  Goal status: INITIAL  2.  Perform TUG in < 12 seconds to demonstrate improved balance Baseline: 19.28 Goal status: INITIAL  3.  Perform 5 x STS in < 12 seconds to demonstrate improved BLE strength Baseline: 22.00 Goal status: INITIAL  4.  Patient will ambulate x at least 500' using LRAD, MI, on level and unlevel surfaces, demonstrating normalized gait pattern Baseline: Small steps, shuffling, slow steps, patient reports she feels unsteady. Goal status: INITIAL  5.  Patient will report LBP of <3/10 at any point during the day or evening. Baseline: Up to 6/10, 4/10 today Goal status: INITIAL    PLAN: PT FREQUENCY: 2x/week  PT DURATION: 10 weeks  PLANNED INTERVENTIONS: Therapeutic exercises, Therapeutic activity, Neuromuscular re-education, Balance training, Gait training, Patient/Family education, Joint mobilization, Dry Needling, Electrical stimulation, Cryotherapy, Moist heat, Traction, Ionotophoresis '4mg'$ /ml Dexamethasone, and Manual therapy  PLAN FOR NEXT SESSION: Initiate HEP   Marcelina Morel, DPT 09/16/2021, 4:42 PM

## 2021-09-17 ENCOUNTER — Other Ambulatory Visit: Payer: Self-pay

## 2021-09-17 DIAGNOSIS — M170 Bilateral primary osteoarthritis of knee: Secondary | ICD-10-CM

## 2021-09-20 ENCOUNTER — Telehealth: Payer: Medicare Other

## 2021-09-22 ENCOUNTER — Ambulatory Visit: Payer: Medicare Other | Admitting: Physical Therapy

## 2021-09-22 ENCOUNTER — Encounter: Payer: Self-pay | Admitting: Physical Therapy

## 2021-09-22 DIAGNOSIS — M25552 Pain in left hip: Secondary | ICD-10-CM

## 2021-09-22 DIAGNOSIS — M545 Low back pain, unspecified: Secondary | ICD-10-CM | POA: Diagnosis not present

## 2021-09-22 DIAGNOSIS — G8929 Other chronic pain: Secondary | ICD-10-CM

## 2021-09-22 DIAGNOSIS — M5441 Lumbago with sciatica, right side: Secondary | ICD-10-CM | POA: Diagnosis not present

## 2021-09-22 DIAGNOSIS — M6281 Muscle weakness (generalized): Secondary | ICD-10-CM

## 2021-09-22 DIAGNOSIS — R262 Difficulty in walking, not elsewhere classified: Secondary | ICD-10-CM | POA: Diagnosis not present

## 2021-09-22 NOTE — Therapy (Signed)
OUTPATIENT PHYSICAL THERAPY LOWER EXTREMITY EVALUATION   Patient Name: Rebecca [name] MRN: [id number] DOB:01-28-1933, 88 y.o., female Today's Date: 09/22/2021   PT End of Session - 09/22/21 1514     Visit Number 2    Date for PT Re-Evaluation 11/25/21    PT Start Time 1502    PT Stop Time 2025    PT Time Calculation (min) 39 min    Activity Tolerance Patient tolerated treatment well    Behavior During Therapy [facility] for tasks assessed/performed              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[place])    Tubular adenoma of colon 05/2009   Past Surgical History:  Procedure Laterality Date   APPENDECTOMY     ARCUATE KERATECTOMY     BILIARY DILATION  03/27/2021   Procedure: BILIARY DILATION;  Surgeon: [name], MD;  Location: [facility];  Service: Gastroenterology;;   CATARACT EXTRACTION, BILATERAL  2015   CHOLECYSTECTOMY N/A 03/28/2021   Procedure: LAPAROSCOPIC CHOLECYSTECTOMY;  Surgeon: Leighton Ruff, MD;  Location: [facility];  Service: General;  Laterality: N/A;   double pallital tori bone removed      ERCP N/A 03/27/2021   Procedure: ENDOSCOPIC RETROGRADE CHOLANGIOPANCREATOGRAPHY (ERCP);  Surgeon: [name], MD;  Location: [facility];  Service: Gastroenterology;  Laterality: N/A;   HERNIA REPAIR     umbilical   KNEE SURGERY Bilateral    morton's neuroma on rt foot and platar facial release     MOUTH SURGERY     "bone shaved off roof of mouth"   REMOVAL OF STONES  03/27/2021   Procedure: REMOVAL OF STONES;  Surgeon: [name], MD;  Location: [facility];  Service: Gastroenterology;;   SALIVARY GLAND SURGERY     removal   SPHINCTEROTOMY  03/27/2021    Procedure: SPHINCTEROTOMY;  Surgeon: [name], MD;  Location: [facility];  Service: Gastroenterology;;   TONSILLECTOMY     Patient Active Problem List   Diagnosis Date Noted   Malnutrition of moderate degree 03/27/2021   Choledocholithiasis 03/26/2021   Cholangitis 03/26/2021   Cholelithiasis 03/25/2021   Elevated LFTs 03/25/2021   Chronic hyponatremia 03/25/2021   Bilateral primary osteoarthritis of knee 06/11/2019   Pain in left shoulder 05/22/2019   Unspecified vitamin D deficiency 12/25/2013   Atherosclerosis 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 04/20/2011   SPINAL STENOSIS, LUMBAR 03/09/2010   OTHER OSTEOPOROSIS 03/09/2010   SCOLIOSIS, LUMBAR SPINE 03/09/2010   MIGRAINE, OPHTHALMIC 10/29/2009   LUMBAR RADICULOPATHY, ATYPICAL 10/29/2009   POLYNEUROPATHY OTHER DISEASES CLASSIFIED ELSW 09/22/2009   UNSPECIFIED PERIPHERAL VASCULAR DISEASE 09/22/2009   ADENOMATOUS COLONIC POLYP 06/10/2009   History of iron deficiency 05/06/2009   CAROTID ARTERY DISEASE 07/28/2008   Allergic rhinitis 03/20/2008   DYSPHAGIA 03/12/2008   GANGLION OF TENDON SHEATH 08/17/2007   Hypothyroidism 11/15/2006   Hyperlipidemia 11/07/2006   ADVEF, DRUG/MEDICINAL/BIOLOGICAL SUBST NOS 11/07/2006   Essential hypertension 10/18/2006   GERD 10/18/2006   DIVERTICULOSIS, COLON 10/18/2006    PCP: [name], MD   REFERRING PROVIDER: Carolann Littler  W, MD   REFERRING DIAG: R29.898 (ICD-10-CM) - Weakness of both lower extremities   THERAPY DIAG:  Pain in left hip  Muscle weakness (generalized)  Difficulty in walking, not elsewhere classified  Chronic bilateral low back pain with right-sided sciatica  Chronic bilateral low back pain without sciatica  Rationale for Evaluation and Treatment Rehabilitation  ONSET DATE: 09/09/2021   SUBJECTIVE:   SUBJECTIVE STATEMENT: Patient reports R knee pain, but not terrible  PERTINENT HISTORY: L hip  fracture. L4, L5 with nerve impingement on the R, per patient. 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 No active treatment.  PAIN:  Are you having pain? Yes: NPRS scale: 4/10 Pain location: R hip and foot Pain description: pain in hip, numbness in foot Aggravating factors: activity, bending Relieving factors: heating pad, avoid ben=ding  PRECAUTIONS: None  WEIGHT BEARING RESTRICTIONS No  FALLS:  Has patient fallen in last 6 months? No  LIVING ENVIRONMENT: Lives with: lives with their family and lives alone Lives in: House/apartment Stairs: Yes: External: 1 steps; none Has following equipment at home: Single point cane  OCCUPATION: retired  PLOF: Independent  PATIENT GOALS Walk straight, preferably without a cane   OBJECTIVE:   DIAGNOSTIC FINDINGS: N/A  COGNITION:  Overall cognitive status: Within functional limits for tasks assessed     SENSATION: Not tested  EDEMA: None   MUSCLE LENGTH: Hamstrings: Right Approx 75 degrees Thomas test: Slightly beyond neutral B.  POSTURE: forward head, decreased lumbar lordosis, decreased thoracic kyphosis, flexed trunk , and Scoliosis with rib hump on L, R paraspinal hump at lumbar region, R shoulder significantly lower than L  PALPATION: Tightness noted throughout paraspinals. TTP medial L scapular border, L gluts.  LOWER EXTREMITY ROM: BLE ROM generally WFL, but R hip mildly diminished and painful.      LOWER EXTREMITY MMT:  MMT Right eval Left eval  Hip flexion 3+ 4  Hip extension 3 4-  Hip abduction 3 4-  Hip adduction    Hip internal rotation    Hip external rotation    Knee flexion 3+ 4  Knee extension 4- 4  Ankle dorsiflexion 4 4  Ankle plantarflexion    Ankle inversion    Ankle eversion     (Blank rows = not tested)    FUNCTIONAL TESTS:  5 times sit to stand: 22.00 Timed up and go (TUG): 19.28  GAIT: Distance walked: 58' Assistive device  utilized: Single point cane Level of assistance: Modified independence Comments: Small steps, shuffling, slow steps, patient reports she feels unsteady.    TODAY'S TREATMENT:  09/22/21 Nu-Step L3 x 6 minutes Supine stretches SKTC, Active HS, figure 4, ITB, 30 sec each side. Supine strength- bridging, SLR, SLR with ER, 10 each Standing hip flexor stretch facing wall, mini squats x 10, rows, sh ext, sh ER with Red Tband  09/16/21 Patient education   PATIENT EDUCATION:  Education details: HEP Person educated: Patient Education method: Consulting civil engineer, Systems developer, handout Education comprehension: verbalized understanding, return demo   HOME EXERCISE PROGRAM: [id number]  ASSESSMENT:  CLINICAL IMPRESSION: Patient reports mild soreness. Initiated HEP with patient returning demo of all exercises, no C/O pain.   OBJECTIVE IMPAIRMENTS Abnormal gait, decreased activity tolerance, decreased balance, decreased coordination, decreased mobility, difficulty walking, decreased ROM, decreased strength, impaired flexibility, improper body mechanics, postural dysfunction, and pain.   ACTIVITY LIMITATIONS carrying, lifting, bending, standing, squatting, sleeping, stairs, reach over head, and locomotion level  PARTICIPATION LIMITATIONS: meal prep,  cleaning, laundry, shopping, and yard work  PERSONAL FACTORS Age, Past/current experiences, and Time since onset of injury/illness/exacerbation are also affecting patient's functional outcome.   REHAB POTENTIAL: Good  CLINICAL DECISION MAKING: Stable/uncomplicated  EVALUATION COMPLEXITY: Low   GOALS: Goals reviewed with patient? Yes  SHORT TERM GOALS: Target date: 10/14/2021  I with basic HEP Baseline: initiated Goal status: ongoing  LONG TERM GOALS: Target date: 11/25/2021   I with final HEP Baseline:  Goal status: INITIAL  2.  Perform TUG in < 12 seconds to demonstrate improved balance Baseline: 19.28 Goal status: INITIAL  3.  Perform 5 x STS  in < 12 seconds to demonstrate improved BLE strength Baseline: 22.00 Goal status: INITIAL  4.  Patient will ambulate x at least 500' using LRAD, MI, on level and unlevel surfaces, demonstrating normalized gait pattern Baseline: Small steps, shuffling, slow steps, patient reports she feels unsteady. Goal status: INITIAL  5.  Patient will report LBP of <3/10 at any point during the day or evening. Baseline: Up to 6/10, 4/10 today Goal status: INITIAL    PLAN: PT FREQUENCY: 2x/week  PT DURATION: 10 weeks  PLANNED INTERVENTIONS: Therapeutic exercises, Therapeutic activity, Neuromuscular re-education, Balance training, Gait training, Patient/Family education, Joint mobilization, Dry Needling, Electrical stimulation, Cryotherapy, Moist heat, Traction, Ionotophoresis '4mg'$ /ml Dexamethasone, and Manual therapy  PLAN FOR NEXT SESSION: Initiate HEP   Marcelina Morel, DPT 09/22/2021, 3:46 PM

## 2021-09-23 ENCOUNTER — Ambulatory Visit: Payer: Medicare Other | Admitting: Physical Therapy

## 2021-09-28 ENCOUNTER — Ambulatory Visit: Payer: Medicare Other | Admitting: Physical Therapy

## 2021-09-29 ENCOUNTER — Ambulatory Visit: Payer: Medicare Other

## 2021-09-29 DIAGNOSIS — M6281 Muscle weakness (generalized): Secondary | ICD-10-CM | POA: Diagnosis not present

## 2021-09-29 DIAGNOSIS — M545 Low back pain, unspecified: Secondary | ICD-10-CM | POA: Diagnosis not present

## 2021-09-29 DIAGNOSIS — M5441 Lumbago with sciatica, right side: Secondary | ICD-10-CM | POA: Diagnosis not present

## 2021-09-29 DIAGNOSIS — R262 Difficulty in walking, not elsewhere classified: Secondary | ICD-10-CM | POA: Diagnosis not present

## 2021-09-29 DIAGNOSIS — G8929 Other chronic pain: Secondary | ICD-10-CM | POA: Diagnosis not present

## 2021-09-29 DIAGNOSIS — M25552 Pain in left hip: Secondary | ICD-10-CM | POA: Diagnosis not present

## 2021-09-29 NOTE — Therapy (Signed)
OUTPATIENT PHYSICAL THERAPY LOWER EXTREMITY TREATMENT   Patient Name: Rebecca [name] MRN: [id number] DOB:1932/10/15, 88 y.o., female Today's Date: 09/29/2021   PT End of Session - 09/29/21 1448     Visit Number 3    Date for PT Re-Evaluation 11/25/21    PT Start Time 1448    PT Stop Time 1528    PT Time Calculation (min) 40 min    Activity Tolerance Patient tolerated treatment well    Behavior During Therapy [facility] for tasks assessed/performed               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[place])    Tubular adenoma of colon 05/2009   Past Surgical History:  Procedure Laterality Date   APPENDECTOMY     ARCUATE KERATECTOMY     BILIARY DILATION  03/27/2021   Procedure: BILIARY DILATION;  Surgeon: [name], MD;  Location: [facility];  Service: Gastroenterology;;   CATARACT EXTRACTION, BILATERAL  2015   CHOLECYSTECTOMY N/A 03/28/2021   Procedure: LAPAROSCOPIC CHOLECYSTECTOMY;  Surgeon: Leighton Ruff, MD;  Location: [facility];  Service: General;  Laterality: N/A;   double pallital tori bone removed      ERCP N/A 03/27/2021   Procedure: ENDOSCOPIC RETROGRADE CHOLANGIOPANCREATOGRAPHY (ERCP);  Surgeon: [name], MD;  Location: [facility];  Service: Gastroenterology;  Laterality: N/A;   HERNIA REPAIR     umbilical   KNEE SURGERY Bilateral    morton's neuroma on rt foot and platar facial release     MOUTH SURGERY     "bone shaved off roof of mouth"   REMOVAL OF STONES  03/27/2021   Procedure: REMOVAL OF STONES;  Surgeon: [name], MD;  Location: [facility];  Service: Gastroenterology;;   SALIVARY GLAND SURGERY     removal   SPHINCTEROTOMY  03/27/2021    Procedure: SPHINCTEROTOMY;  Surgeon: [name], MD;  Location: [facility];  Service: Gastroenterology;;   TONSILLECTOMY     Patient Active Problem List   Diagnosis Date Noted   Malnutrition of moderate degree 03/27/2021   Choledocholithiasis 03/26/2021   Cholangitis 03/26/2021   Cholelithiasis 03/25/2021   Elevated LFTs 03/25/2021   Chronic hyponatremia 03/25/2021   Bilateral primary osteoarthritis of knee 06/11/2019   Pain in left shoulder 05/22/2019   Unspecified vitamin D deficiency 12/25/2013   Atherosclerosis 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 04/20/2011   SPINAL STENOSIS, LUMBAR 03/09/2010   OTHER OSTEOPOROSIS 03/09/2010   SCOLIOSIS, LUMBAR SPINE 03/09/2010   MIGRAINE, OPHTHALMIC 10/29/2009   LUMBAR RADICULOPATHY, ATYPICAL 10/29/2009   POLYNEUROPATHY OTHER DISEASES CLASSIFIED ELSW 09/22/2009   UNSPECIFIED PERIPHERAL VASCULAR DISEASE 09/22/2009   ADENOMATOUS COLONIC POLYP 06/10/2009   History of iron deficiency 05/06/2009   CAROTID ARTERY DISEASE 07/28/2008   Allergic rhinitis 03/20/2008   DYSPHAGIA 03/12/2008   GANGLION OF TENDON SHEATH 08/17/2007   Hypothyroidism 11/15/2006   Hyperlipidemia 11/07/2006   ADVEF, DRUG/MEDICINAL/BIOLOGICAL SUBST NOS 11/07/2006   Essential hypertension 10/18/2006   GERD 10/18/2006   DIVERTICULOSIS, COLON 10/18/2006    PCP: [name], MD   REFERRING PROVIDER: Elease Hashimoto,  Alinda Sierras, MD   REFERRING DIAG: R29.898 (ICD-10-CM) - Weakness of both lower extremities   THERAPY DIAG:  Chronic bilateral low back pain without sciatica  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    PERTINENT HISTORY: L hip fracture. L4, L5 with nerve impingement on the R, per patient. 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 No active treatment.  PAIN:  Are you having pain? Yes: NPRS scale: 4/10 Pain location: R hip and foot Pain description: pain in hip, numbness in foot Aggravating factors: activity, bending Relieving factors: heating pad, avoid ben=ding  PRECAUTIONS: None  WEIGHT BEARING RESTRICTIONS No  FALLS:  Has patient fallen in last 6 months? No  LIVING ENVIRONMENT: Lives with: lives with their family and lives alone Lives in: House/apartment Stairs: Yes: External: 1 steps; none Has following equipment at home: Single point cane  OCCUPATION: retired  PLOF: Independent  PATIENT GOALS Walk straight, preferably without a cane   OBJECTIVE:   DIAGNOSTIC FINDINGS: N/A  COGNITION:  Overall cognitive status: Within functional limits for tasks assessed     SENSATION: Not tested  EDEMA: None   MUSCLE LENGTH: Hamstrings: Right Approx 75 degrees Thomas test: Slightly beyond neutral B.  POSTURE: forward head, decreased lumbar lordosis, decreased thoracic kyphosis, flexed trunk , and Scoliosis with rib hump on L, R paraspinal hump at lumbar region, R shoulder significantly lower than L  PALPATION: Tightness noted throughout paraspinals. TTP medial L scapular border, L gluts.  LOWER EXTREMITY ROM: BLE ROM generally WFL, but R hip mildly diminished and painful.      LOWER EXTREMITY MMT:  MMT Right eval Left eval  Hip flexion 3+ 4  Hip extension 3 4-  Hip abduction 3 4-  Hip adduction    Hip internal rotation    Hip external rotation    Knee flexion 3+ 4  Knee extension 4- 4  Ankle dorsiflexion 4 4  Ankle plantarflexion    Ankle inversion    Ankle eversion     (Blank rows = not tested)    FUNCTIONAL TESTS:  5 times sit to stand:  22.00 Timed up and go (TUG): 19.28  GAIT: Distance walked: 58' Assistive device utilized: Single point cane Level of assistance: Modified independence Comments: Small steps, shuffling, slow steps, patient reports she feels unsteady.    TODAY'S TREATMENT: 09/29/21 Nustep L3 x49mns Supine K2C 30s HS stretch 30s  Piriformis stretch 30s  Feet on ball trunk rotations 164m SLR x10, 2#x10 LAQ 2# 2x10 2 way hip 2# 2x10    STS 2x10 Step ups 4" w/1HHA   09/22/21 Nu-Step L3 x 6 minutes Supine stretches SKTC, Active HS, figure 4, ITB, 30 sec each side. Supine strength- bridging, SLR, SLR with ER, 10 each Standing hip flexor stretch facing wall, mini squats x 10, rows, sh ext, sh ER with Red Tband  09/16/21 Patient education   PATIENT EDUCATION:  Education details: HEP Person educated: Patient Education method: ExConsulting civil engineerdeSystems developerhandout Education comprehension: verbalized understanding, return demo   HOME EXERCISE PROGRAM: [id number]:  CLINICAL IMPRESSION:  Patient returns to PT feeling tired from  being at the dentist all morning for wisdom tooth filling. She reports mild pain in back and soreness in knees. We worked on low back and LE mobility, flexibility, and strengthening exercises today. Pt complains of cramps in quads when doing stretches at clinic and at home. She has noticeable difference in strength in R compared to L, and is weaker on her R side. She did will with session and will continue to progress.   OBJECTIVE IMPAIRMENTS Abnormal gait, decreased activity tolerance, decreased balance, decreased coordination, decreased mobility, difficulty walking, decreased ROM, decreased strength, impaired flexibility, improper body mechanics, postural dysfunction, and pain.   ACTIVITY LIMITATIONS carrying, lifting, bending, standing, squatting, sleeping, stairs, reach over head, and locomotion level  PARTICIPATION LIMITATIONS: meal prep, cleaning, laundry, shopping,  and yard work  PERSONAL FACTORS Age, Past/current experiences, and Time since onset of injury/illness/exacerbation are also affecting patient's functional outcome.   REHAB POTENTIAL: Good  CLINICAL DECISION MAKING: Stable/uncomplicated  EVALUATION COMPLEXITY: Low   GOALS: Goals reviewed with patient? Yes  SHORT TERM GOALS: Target date: 10/14/2021  I with basic HEP Baseline: initiated Goal status: ongoing  LONG TERM GOALS: Target date: 11/25/2021   I with final HEP Baseline:  Goal status: INITIAL  2.  Perform TUG in < 12 seconds to demonstrate improved balance Baseline: 19.28 Goal status: INITIAL  3.  Perform 5 x STS in < 12 seconds to demonstrate improved BLE strength Baseline: 22.00 Goal status: INITIAL  4.  Patient will ambulate x at least 500' using LRAD, MI, on level and unlevel surfaces, demonstrating normalized gait pattern Baseline: Small steps, shuffling, slow steps, patient reports she feels unsteady. Goal status: INITIAL  5.  Patient will report LBP of <3/10 at any point during the day or evening. Baseline: Up to 6/10, 4/10 today Goal status: INITIAL    PLAN: PT FREQUENCY: 2x/week  PT DURATION: 10 weeks  PLANNED INTERVENTIONS: Therapeutic exercises, Therapeutic activity, Neuromuscular re-education, Balance training, Gait training, Patient/Family education, Joint mobilization, Dry Needling, Electrical stimulation, Cryotherapy, Moist heat, Traction, Ionotophoresis '4mg'$ /ml Dexamethasone, and Manual therapy  PLAN FOR NEXT SESSION: LE strengthening, functional activities   Marcelina Morel, DPT 09/29/2021, 3:29 PM

## 2021-09-30 ENCOUNTER — Encounter: Payer: Self-pay | Admitting: Family Medicine

## 2021-09-30 ENCOUNTER — Ambulatory Visit (INDEPENDENT_AMBULATORY_CARE_PROVIDER_SITE_OTHER): Payer: Medicare Other | Admitting: Family Medicine

## 2021-09-30 VITALS — BP 132/68 | HR 51 | Temp 97.9°F | Ht 62.0 in | Wt 121.0 lb

## 2021-09-30 DIAGNOSIS — I779 Disorder of arteries and arterioles, unspecified: Secondary | ICD-10-CM | POA: Diagnosis not present

## 2021-09-30 DIAGNOSIS — E785 Hyperlipidemia, unspecified: Secondary | ICD-10-CM | POA: Diagnosis not present

## 2021-09-30 DIAGNOSIS — M810 Age-related osteoporosis without current pathological fracture: Secondary | ICD-10-CM | POA: Diagnosis not present

## 2021-09-30 DIAGNOSIS — I10 Essential (primary) hypertension: Secondary | ICD-10-CM | POA: Diagnosis not present

## 2021-09-30 DIAGNOSIS — C9110 Chronic lymphocytic leukemia of B-cell type not having achieved remission: Secondary | ICD-10-CM

## 2021-09-30 DIAGNOSIS — E039 Hypothyroidism, unspecified: Secondary | ICD-10-CM

## 2021-09-30 MED ORDER — LOSARTAN POTASSIUM 100 MG PO TABS: 100.0000 mg | ORAL_TABLET | Freq: Every day | ORAL | 3 refills | Status: DC

## 2021-09-30 NOTE — Assessment & Plan Note (Signed)
S: medication: losartan '100mg'$  Home readings #s: has a hard time checking BP Readings from Last 3 Encounters:  09/30/21 132/68  09/16/21 130/68  08/13/21 (!) 155/69  A/P: Controlled. Continue current medications.

## 2021-09-30 NOTE — Assessment & Plan Note (Signed)
S: compliant On thyroid medication-levothyroxine 88 mcg Lab Results  Component Value Date   TSH 0.766 03/26/2021  A/P:Controlled. Continue current medications.

## 2021-09-30 NOTE — Assessment & Plan Note (Signed)
#  hyperlipidemia #carotid stenosis noted 06/22/20 most recently- 1-39% in right and left ICA. ECA >50% stenosed S: Medication:Rosuvastatin 20 mg prescribed- had a jell/weakness sensation in calves when she took it and opted to stop -weakness on lipitor in past Lab Results  Component Value Date   CHOL 132 08/21/2020   HDL 39.20 08/21/2020   LDLCALC 55 08/21/2020   LDLDIRECT 89.0 05/13/2020   TRIG 188.0 (H) 08/21/2020   CHOLHDL 3 08/21/2020  A/P: Cholesterol was excellent when checked in 2022 but she has not been able to tolerate the rosuvastatin 20 mg due to a weakness/jellylike sensation in her legs.  Also did not tolerate atorvastatin in the past due to weakness - She would like to see how she would do on just 10 mg of rosuvastatin and she is going to try that and update me within a month if she is tolerating that-we can always send in a new prescription for just rosuvastatin 10 mg -Carotid artery without significant narrowing but with some plaque/atherosclerosis LDL goal 70 or less is reasonable but we need to balance this with her side effects

## 2021-09-30 NOTE — Patient Instructions (Addendum)
-   She would like to see how she would do on just 10 mg of rosuvastatin and she is going to try that and update me within a month if she is tolerating that-we can always send in a new prescription for just rosuvastatin 10 mg  Recommended follow up: Return in about 6 months (around 04/01/2022) for followup or sooner if needed.Schedule b4 you leave.

## 2021-09-30 NOTE — Progress Notes (Signed)
Phone: [phone]   Subjective:  Patient presents today to establish care with me as their new primary care provider. Patient was formerly a patient of Dr. Elease Hashimoto.  Chief Complaint  Patient presents with   Annual Exam    Has not taken Rosuvastatin due to leg cramps in a few weeks.   See problem oriented charting  The following were reviewed and entered/updated in epic: Past Medical History:  Diagnosis Date   Anemia    Anxiety    Bifascicular block    Diverticulitis    Diverticulosis    Gastroparesis    GERD (gastroesophageal reflux disease)    Glaucoma    Hemorrhoids    Hyperlipidemia    Hypertension    Hypertension    IBS (irritable bowel syndrome)    Rectal ulcer    SCOLIOSIS, LUMBAR SPINE 03/09/2010   Qualifier: Diagnosis of  By: Arnoldo Morale MD, John E    Segmental colitis [facility])    Tubular adenoma of colon 05/2009   Patient Active Problem List   Diagnosis Date Noted   CLL (chronic lymphocytic leukemia) ([place]) 09/30/2021    Priority: High   Carotid artery disease ([place]) 07/28/2008    Priority: High   Chronic hyponatremia 03/25/2021    Priority: Medium    Unspecified vitamin D deficiency 12/25/2013    Priority: Medium    SPINAL STENOSIS, LUMBAR 03/09/2010    Priority: Medium    Osteoporosis 03/09/2010    Priority: Medium    MIGRAINE, OPHTHALMIC 10/29/2009    Priority: Medium    Hypothyroidism 11/15/2006    Priority: Medium    Hyperlipidemia 11/07/2006    Priority: Medium    Essential hypertension 10/18/2006    Priority: Medium    GERD 10/18/2006    Priority: Medium    Cholangitis 03/26/2021    Priority: Low   Cholelithiasis 03/25/2021    Priority: Low   Elevated LFTs 03/25/2021    Priority: Low   Bilateral primary osteoarthritis of knee 06/11/2019    Priority: Low   Pain in left shoulder 05/22/2019    Priority: Low   ADENOMATOUS COLONIC POLYP 06/10/2009    Priority: Low   History of iron deficiency 05/06/2009    Priority: Low   Allergic rhinitis  03/20/2008    Priority: Low   DYSPHAGIA 03/12/2008    Priority: Low   Ganglion of tendon sheath 08/17/2007    Priority: Low   DIVERTICULOSIS, COLON 10/18/2006    Priority: Low   Past Surgical History:  Procedure Laterality Date   APPENDECTOMY     ARCUATE KERATECTOMY     BILIARY DILATION  03/27/2021   Procedure: BILIARY DILATION;  Surgeon: [name], MD;  Location: [facility];  Service: Gastroenterology;;   CATARACT EXTRACTION, BILATERAL  2015   CHOLECYSTECTOMY N/A 03/28/2021   Procedure: LAPAROSCOPIC CHOLECYSTECTOMY;  Surgeon: Leighton Ruff, MD;  Location: [facility];  Service: General;  Laterality: N/A;   double pallital tori bone removed      ERCP N/A 03/27/2021   Procedure: ENDOSCOPIC RETROGRADE CHOLANGIOPANCREATOGRAPHY (ERCP);  Surgeon: [name], MD;  Location: [facility];  Service: Gastroenterology;  Laterality: N/A;   HERNIA REPAIR     umbilical   KNEE SURGERY Bilateral    morton's neuroma on rt foot and platar facial release     MOUTH SURGERY     "bone shaved off roof of mouth"   REMOVAL OF STONES  03/27/2021   Procedure: REMOVAL OF STONES;  Surgeon: [name], MD;  Location: [facility];  Service: Gastroenterology;;   SALIVARY GLAND SURGERY     removal   SPHINCTEROTOMY  03/27/2021   Procedure: SPHINCTEROTOMY;  Surgeon: [name], MD;  Location: [facility];  Service: Gastroenterology;;   TONSILLECTOMY      Family History  Problem Relation Age of Onset   Diabetes Mother    Heart disease Mother    Hyperlipidemia Mother    Hypertension Mother    Anuerysm Father        abdominal- heavy smoker   Heart disease Father    Hypertension Father    Colon polyps Sister    Lung cancer Sister        smoker   Colon polyps Brother    Diabetes Brother    Hyperlipidemia Brother    Hypertension Brother    Heart disease Brother    Colon cancer Paternal Grandmother        with possible stomach cancer   Migraines Daughter    Kidney Stones  Daughter    Colon polyps Daughter    Breast cancer Daughter    Cancer Daughter        salivary   Colon cancer Maternal Aunt        Rectal cancer   Crohn's disease Neg Hx    Pancreatic cancer Neg Hx     Medications- reviewed and updated Current Outpatient Medications  Medication Sig Dispense Refill   ALPRAZolam (XANAX) 0.25 MG tablet TAKE 1 TABLET(0.25 MG) BY MOUTH AT BEDTIME AS NEEDED 20 tablet 0   Calcium Carb-Cholecalciferol (CALCIUM-VITAMIN D) 600-400 MG-UNIT TABS Take 1 tablet by mouth daily.     Cholecalciferol (VITAMIN D3) 2000 UNITS TABS Take 1 tablet by mouth daily.     diclofenac sodium (VOLTAREN) 1 % GEL Apply 2 g topically 4 (four) times daily. 1 Tube 1   esomeprazole (NEXIUM) 20 MG capsule Take 20 mg by mouth 2 (two) times daily before a meal.     latanoprost (XALATAN) 0.005 % ophthalmic solution Place 1 drop into both eyes at bedtime.     levothyroxine (SYNTHROID) 88 MCG tablet Take 1 tablet (88 mcg total) by mouth daily before breakfast. 30 tablet 0   loperamide (IMODIUM) 2 MG capsule Take 2 capsules (4 mg total) by mouth every 6 (six) hours as needed for diarrhea or loose stools. 30 capsule 0   metoCLOPramide (REGLAN) 5 MG tablet TAKE 1 TABLET BY MOUTH TWICE DAILY WITH MEALS (Patient taking differently: Take 5 mg by mouth daily. TAKE 1 TABLET BY MOUTH TWICE DAILY WITH MEALS) 60 tablet 3   timolol (TIMOPTIC) 0.5 % ophthalmic solution Place 1 drop into both eyes 2 (two) times daily.     vitamin B-12 (CYANOCOBALAMIN) 500 MCG tablet Take 500 mcg by mouth every morning.     vitamin C (ASCORBIC ACID) 500 MG tablet Take 500 mg by mouth daily.     losartan (COZAAR) 100 MG tablet Take 1 tablet (100 mg total) by mouth daily. 90 tablet 3   rosuvastatin (CRESTOR) 20 MG tablet TAKE 1 TABLET(20 MG) BY MOUTH DAILY (Patient not taking: Reported on 09/30/2021) 30 tablet 5   No current facility-administered medications for this visit.    Allergies-reviewed and updated Allergies   Allergen Reactions   Levaquin [Levofloxacin] Other (See Comments)    Very nauseous and shakey   Sulfamethoxazole-Trimethoprim Nausea Only    shakey   Lactose Intolerance (Gi) Diarrhea and Other (See Comments)    Stomach hurts    Tramadol Hcl Nausea Only  and Palpitations   Augmentin [Amoxicillin-Pot Clavulanate] Diarrhea   Ciprofloxacin Nausea Only    REACTION: Thrush, Nausea, Shakey   Pneumococcal Vaccine Polyvalent     REACTION: RED AND RAISED RASH ON ARM   Zosyn [Piperacillin Sod-Tazobactam So] Diarrhea   Adhesive [Tape] Rash   Codeine Nausea Only    Social History   Social History Narrative   Widowed. 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 Irene Limbo closely   #hypertension S: medication: losartan '100mg'$  Home readings #s: has a hard time checking BP Readings from Last 3 Encounters:  09/30/21 132/68  09/16/21 130/68  08/13/21 (!) 155/69  A/P: Controlled. Continue current medications.   #hyperlipidemia #carotid stenosis noted 06/22/20 most recently- 1-39% in right and left ICA. ECA >50% stenosed S: Medication:Rosuvastatin 20 mg prescribed- had a jell/weakness sensation in calves when she took it and opted to stop -weakness on lipitor in past Lab Results  Component Value Date   CHOL 132 08/21/2020   HDL 39.20 08/21/2020    LDLCALC 55 08/21/2020   LDLDIRECT 89.0 05/13/2020   TRIG 188.0 (H) 08/21/2020   CHOLHDL 3 08/21/2020  A/P: Cholesterol was excellent when checked in 2022 but she has not been able to tolerate the rosuvastatin 20 mg due to a weakness/jellylike sensation in her legs.  Also did not tolerate atorvastatin in the past due to weakness - She would like to see how she would do on just 10 mg of rosuvastatin and she is going to try that and update me within a month if she is tolerating that-we can always send in a new prescription for just rosuvastatin 10 mg -Carotid artery without significant narrowing but with some plaque/atherosclerosis LDL goal 70 or less is reasonable but we need to balance this with her side effects    #hypothyroidism S: compliant On thyroid medication-levothyroxine 88 mcg Lab Results  Component Value Date   TSH 0.766 03/26/2021  A/P:Controlled. Continue current medications.    # Osteoporosis #Vitamin D deficiency S: Last DEXA:  with GYN and has planned repeat Medication (bisphosphonate or prolia): 5 years fosamax through GYN in past  Calcium: '1200mg'$  (through diet ok) recommended  Vitamin D: At least 1000 units a day recommended-takes 2000 units Last vitamin D Lab Results  Component Value Date   VD25OH 45 03/20/2020  A/P:  sounds to have bene appropriately treated by GYN- continue to follow with them    # GERD S:Medication: Nexium 20 mg- controls symptoms -Takes empiric B12 with nexium A/P: Controlled. Continue current medications.    #Lumbar stenosis with history of sciatica S:Recurrence in 2023 with Sterapred pack and physical therapy A/P: has visit at end of July  with Dr. Ellene Route- hoping to get off of cane and feel better.     Hyperlipidemia   #hyperlipidemia #carotid stenosis noted 06/22/20 most recently- 1-39% in right and left ICA. ECA >50% stenosed S: Medication:Rosuvastatin 20 mg prescribed- had a jell/weakness sensation in calves when she took it and opted  to stop -weakness on lipitor in past Lab Results  Component Value Date   CHOL 132 08/21/2020   HDL 39.20 08/21/2020   LDLCALC 55 08/21/2020   LDLDIRECT 89.0 05/13/2020   TRIG 188.0 (H) 08/21/2020   CHOLHDL 3 08/21/2020  A/P: Cholesterol was excellent when checked in 2022 but she has not been able to tolerate the rosuvastatin 20 mg due to a weakness/jellylike sensation in her legs.  Also did not tolerate atorvastatin in the past due to weakness - She would like to see how she would do on just 10 mg of rosuvastatin and she is going to try that and update me within a month if she is tolerating that-we can always send in a new prescription for just rosuvastatin 10 mg -Carotid artery without significant narrowing but with some plaque/atherosclerosis LDL goal 70 or less is reasonable but we need to balance this with her side effects  Essential hypertension S: medication: losartan '100mg'$  Home readings #s: has a hard time checking BP Readings from Last 3 Encounters:  09/30/21 132/68  09/16/21 130/68  08/13/21 (!) 155/69  A/P: Controlled. Continue current medications.   Hypothyroidism S: compliant On thyroid medication-levothyroxine 88 mcg Lab Results  Component Value Date   TSH 0.766 03/26/2021  A/P:Controlled. Continue current medications.    Recommended follow up: Return in about 6 months (around 04/01/2022) for followup or sooner if needed.Schedule b4 you leave. Future Appointments  Date Time Provider [place]  10/06/2021  3:45 PM Marcelina Morel, PT [facility]  10/07/2021  2:45 PM Marcelina Morel, PT [facility]  10/12/2021  1:00 PM Garald Balding, MD [facility] None  10/13/2021 10:15 AM Andris Baumann, PT [facility]  10/15/2021  9:30 AM Marcelina Morel, PT [facility]  10/19/2021  1:15 PM Garald Balding, MD [facility] None  10/19/2021  3:00 PM Andris Baumann, [place]  10/20/2021  3:00 PM Marcelina Morel, PT [facility]  10/25/2021  3:00 PM Marcelina Morel, PT  [facility]  10/28/2021  1:00 PM Garald Balding, MD [facility] None  10/28/2021  2:45 PM Marcelina Morel, PT [facility]  12/14/2021 12:30 PM [facility] None  12/14/2021  1:00 PM Brunetta Genera, MD [facility] None   Lab/Order associations:   ICD-10-CM   1. Hyperlipidemia, unspecified hyperlipidemia type  E78.5     2. Bilateral carotid artery disease, unspecified type (HCC)  I77.9     3. Essential hypertension  I10     4. Hypothyroidism, unspecified type  E03.9     5. Osteoporosis without current pathological fracture, unspecified osteoporosis type  M81.0     6. CLL (chronic lymphocytic leukemia) (HCC)  C91.10       Meds ordered this encounter  Medications   losartan (COZAAR) 100 MG tablet    Sig: Take 1 tablet (100 mg total) by mouth daily.    Dispense:  90 tablet    Refill:  3    Time Spent: 40 minutes of total time (1:57 PM- 2:37 PM) was spent on the date of the encounter performing the following actions: chart review prior to seeing the patient, obtaining history and reviewing/updating chart  together, performing a medically necessary exam, counseling on the treatment plan- mainly continuing treatment except for tweak on statin, placing orders, and documenting in our EHR.   Return precautions advised.  Garret Reddish, MD

## 2021-10-06 ENCOUNTER — Encounter: Payer: Self-pay | Admitting: Physical Therapy

## 2021-10-06 ENCOUNTER — Ambulatory Visit: Payer: Medicare Other | Attending: Family Medicine | Admitting: Physical Therapy

## 2021-10-06 DIAGNOSIS — M25552 Pain in left hip: Secondary | ICD-10-CM | POA: Insufficient documentation

## 2021-10-06 DIAGNOSIS — R262 Difficulty in walking, not elsewhere classified: Secondary | ICD-10-CM | POA: Diagnosis not present

## 2021-10-06 DIAGNOSIS — M5441 Lumbago with sciatica, right side: Secondary | ICD-10-CM | POA: Diagnosis not present

## 2021-10-06 DIAGNOSIS — M6281 Muscle weakness (generalized): Secondary | ICD-10-CM | POA: Diagnosis not present

## 2021-10-06 DIAGNOSIS — G8929 Other chronic pain: Secondary | ICD-10-CM | POA: Diagnosis not present

## 2021-10-06 DIAGNOSIS — M545 Low back pain, unspecified: Secondary | ICD-10-CM | POA: Diagnosis not present

## 2021-10-06 NOTE — Therapy (Signed)
OUTPATIENT PHYSICAL THERAPY LOWER EXTREMITY TREATMENT   Patient Name: Rebecca [name] MRN: [id number] DOB:[birth year], 88 y.o., female Today's Date: 10/06/2021   PT End of Session - 10/06/21 1627     Visit Number 4    Date for PT Re-Evaluation 11/25/21    PT Start Time 7829    PT Stop Time 1626    PT Time Calculation (min) 39 min    Activity Tolerance Patient tolerated treatment well    Behavior During Therapy WFL for tasks assessed/performed                Past Medical History:  Diagnosis Date   Anemia    Anxiety    Bifascicular block    Diverticulitis    Diverticulosis    Gastroparesis    GERD (gastroesophageal reflux disease)    Glaucoma    Hemorrhoids    Hyperlipidemia    Hypertension    Hypertension    IBS (irritable bowel syndrome)    Rectal ulcer    SCOLIOSIS, LUMBAR SPINE 03/09/2010   Qualifier: Diagnosis of  By: Arnoldo Morale MD, John E    Segmental colitis [facility])    Tubular adenoma of colon 05/2009   Past Surgical History:  Procedure Laterality Date   APPENDECTOMY     ARCUATE KERATECTOMY     BILIARY DILATION  03/27/2021   Procedure: BILIARY DILATION;  Surgeon: [name], MD;  Location: [facility];  Service: Gastroenterology;;   CATARACT EXTRACTION, BILATERAL  2015   CHOLECYSTECTOMY N/A 03/28/2021   Procedure: LAPAROSCOPIC CHOLECYSTECTOMY;  Surgeon: Leighton Ruff, MD;  Location: [facility];  Service: General;  Laterality: N/A;   double pallital tori bone removed      ERCP N/A 03/27/2021   Procedure: ENDOSCOPIC RETROGRADE CHOLANGIOPANCREATOGRAPHY (ERCP);  Surgeon: [name], MD;  Location: [facility];  Service: Gastroenterology;  Laterality: N/A;   HERNIA REPAIR     umbilical   KNEE SURGERY Bilateral    morton's neuroma on rt foot and platar facial release     MOUTH SURGERY     "bone shaved off roof of mouth"   REMOVAL OF STONES  03/27/2021   Procedure: REMOVAL OF STONES;  Surgeon: [name], MD;  Location: [facility];   Service: Gastroenterology;;   SALIVARY GLAND SURGERY     removal   SPHINCTEROTOMY  03/27/2021   Procedure: SPHINCTEROTOMY;  Surgeon: [name], MD;  Location: [facility];  Service: Gastroenterology;;   TONSILLECTOMY     Patient Active Problem List   Diagnosis Date Noted   CLL (chronic lymphocytic leukemia) ([place]) 09/30/2021   Cholangitis 03/26/2021   Cholelithiasis 03/25/2021   Elevated LFTs 03/25/2021   Chronic hyponatremia 03/25/2021   Bilateral primary osteoarthritis of knee 06/11/2019   Pain in left shoulder 05/22/2019   Unspecified vitamin D deficiency 12/25/2013   SPINAL STENOSIS, LUMBAR 03/09/2010   Osteoporosis 03/09/2010   MIGRAINE, OPHTHALMIC 10/29/2009   ADENOMATOUS COLONIC POLYP 06/10/2009   History of iron deficiency 05/06/2009   Carotid artery disease ([place]) 07/28/2008   Allergic rhinitis 03/20/2008   DYSPHAGIA 03/12/2008   Ganglion of tendon sheath 08/17/2007   Hypothyroidism 11/15/2006   Hyperlipidemia 11/07/2006   Essential hypertension 10/18/2006   GERD 10/18/2006   DIVERTICULOSIS, COLON 10/18/2006    PCP: [name], MD   REFERRING PROVIDER: [name], MD   REFERRING DIAG: R29.898 (ICD-10-CM) - Weakness of both lower extremities   THERAPY DIAG:  Chronic bilateral low back pain without sciatica  Pain in left  hip  Chronic bilateral low back pain with right-sided sciatica  Difficulty in walking, not elsewhere classified  Muscle weakness (generalized)  Rationale for Evaluation and Treatment Rehabilitation  ONSET DATE: 09/09/2021   SUBJECTIVE:   SUBJECTIVE STATEMENT: Patient reports that she has missed a couple days of HEP, but is trying to be consistent.   PERTINENT HISTORY: L hip fracture. L4, L5 with nerve impingement on the R, per patient. 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 No active treatment.  PAIN:  Are you having pain? Yes: NPRS scale: 4/10 Pain  location: R hip and foot Pain description: pain in hip, numbness in foot Aggravating factors: activity, bending Relieving factors: heating pad, avoid ben=ding  PRECAUTIONS: None  WEIGHT BEARING RESTRICTIONS No  FALLS:  Has patient fallen in last 6 months? No  LIVING ENVIRONMENT: Lives with: lives with their family and lives alone Lives in: House/apartment Stairs: Yes: External: 1 steps; none Has following equipment at home: Single point cane  OCCUPATION: retired  PLOF: Independent  PATIENT GOALS Walk straight, preferably without a cane   OBJECTIVE:   DIAGNOSTIC FINDINGS: N/A  COGNITION:  Overall cognitive status: Within functional limits for tasks assessed     SENSATION: Not tested  EDEMA: None   MUSCLE LENGTH: Hamstrings: Right Approx 75 degrees Thomas test: Slightly beyond neutral B.  POSTURE: forward head, decreased lumbar lordosis, decreased thoracic kyphosis, flexed trunk , and Scoliosis with rib hump on L, R paraspinal hump at lumbar region, R shoulder significantly lower than L  PALPATION: Tightness noted throughout paraspinals. TTP medial L scapular border, L gluts.  LOWER EXTREMITY ROM: BLE ROM generally WFL, but R hip mildly diminished and painful.      LOWER EXTREMITY MMT:  MMT Right eval Left eval  Hip flexion 3+ 4  Hip extension 3 4-  Hip abduction 3 4-  Hip adduction    Hip internal rotation    Hip external rotation    Knee flexion 3+ 4  Knee extension 4- 4  Ankle dorsiflexion 4 4  Ankle plantarflexion    Ankle inversion    Ankle eversion     (Blank rows = not tested)    FUNCTIONAL TESTS:  5 times sit to stand: 22.00 Timed up and go (TUG): 19.28  GAIT: Distance walked: 44' Assistive device utilized: Single point cane Level of assistance: Modified independence Comments: Small steps, shuffling, slow steps, patient reports she feels unsteady.    TODAY'S TREATMENT: 10/06/21 Nu-Step L3 x 6 mins Sitting LE stretching- HS,  figure 4, ITB. Patient had great difficulty achieving a stretch. Facilitated lower trunk mobilizations-ant/post, and lateral. Again, great diffiuclty, required max TC and VC on physiodisc. Ambulated in assessment with cane in R hand, L hand and holding cane up with BUE. She maintains L lateral trunk flexion, and forward flex, slightly improved while holding the cane. Her trunk is very immobile in any direction.  09/29/21 Nustep L3 x67mns Supine K2C 30s HS stretch 30s  Piriformis stretch 30s  Feet on ball trunk rotations 175m SLR x10, 2#x10 LAQ 2# 2x10 2 way hip 2# 2x10    STS 2x10 Step ups 4" w/1HHA   09/22/21 Nu-Step L3 x 6 minutes Supine stretches SKTC, Active HS, figure 4, ITB, 30 sec each side. Supine strength- bridging, SLR, SLR with ER, 10 each Standing hip flexor stretch facing wall, mini squats x 10, rows, sh ext, sh ER with Red Tband  09/16/21 Patient education   PATIENT  EDUCATION:  Education details: HEP Person educated: Patient Education method: Consulting civil engineer, demo, handout Education comprehension: verbalized understanding, return demo   HOME EXERCISE PROGRAM: [id number]  ASSESSMENT:  CLINICAL IMPRESSION: Patient very stiff in trunk, with R lateral trunk flex and forward flexion. Therapist facilitated lower trunk mobilizations in all directions, required max A.   OBJECTIVE IMPAIRMENTS Abnormal gait, decreased activity tolerance, decreased balance, decreased coordination, decreased mobility, difficulty walking, decreased ROM, decreased strength, impaired flexibility, improper body mechanics, postural dysfunction, and pain.   ACTIVITY LIMITATIONS carrying, lifting, bending, standing, squatting, sleeping, stairs, reach over head, and locomotion level  PARTICIPATION LIMITATIONS: meal prep, cleaning, laundry, shopping, and yard work  PERSONAL FACTORS Age, Past/current experiences, and Time since onset of injury/illness/exacerbation are also affecting patient's  functional outcome.   REHAB POTENTIAL: Good  CLINICAL DECISION MAKING: Stable/uncomplicated  EVALUATION COMPLEXITY: Low   GOALS: Goals reviewed with patient? Yes  SHORT TERM GOALS: Target date: 10/14/2021  I with basic HEP Baseline: initiated Goal status: ongoing  LONG TERM GOALS: Target date: 11/25/2021   I with final HEP Baseline:  Goal status: INITIAL  2.  Perform TUG in < 12 seconds to demonstrate improved balance Baseline: 19.28 Goal status: INITIAL  3.  Perform 5 x STS in < 12 seconds to demonstrate improved BLE strength Baseline: 22.00 Goal status: INITIAL  4.  Patient will ambulate x at least 500' using LRAD, MI, on level and unlevel surfaces, demonstrating normalized gait pattern Baseline: Small steps, shuffling, slow steps, patient reports she feels unsteady. Goal status: INITIAL  5.  Patient will report LBP of <3/10 at any point during the day or evening. Baseline: Up to 6/10, 4/10 today Goal status: INITIAL    PLAN: PT FREQUENCY: 2x/week  PT DURATION: 10 weeks  PLANNED INTERVENTIONS: Therapeutic exercises, Therapeutic activity, Neuromuscular re-education, Balance training, Gait training, Patient/Family education, Joint mobilization, Dry Needling, Electrical stimulation, Cryotherapy, Moist heat, Traction, Ionotophoresis '4mg'$ /ml Dexamethasone, and Manual therapy  PLAN FOR NEXT SESSION: Trunk mobilizaitons- lower trunk, postural re-educaiton and strengthening.   Marcelina Morel, DPT 10/06/2021, 4:28 PM

## 2021-10-07 ENCOUNTER — Ambulatory Visit: Payer: Medicare Other | Admitting: Physical Therapy

## 2021-10-07 ENCOUNTER — Encounter: Payer: Self-pay | Admitting: Physical Therapy

## 2021-10-07 DIAGNOSIS — M6281 Muscle weakness (generalized): Secondary | ICD-10-CM

## 2021-10-07 DIAGNOSIS — G8929 Other chronic pain: Secondary | ICD-10-CM | POA: Diagnosis not present

## 2021-10-07 DIAGNOSIS — M25552 Pain in left hip: Secondary | ICD-10-CM | POA: Diagnosis not present

## 2021-10-07 DIAGNOSIS — R262 Difficulty in walking, not elsewhere classified: Secondary | ICD-10-CM | POA: Diagnosis not present

## 2021-10-07 DIAGNOSIS — M545 Low back pain, unspecified: Secondary | ICD-10-CM | POA: Diagnosis not present

## 2021-10-07 DIAGNOSIS — M5441 Lumbago with sciatica, right side: Secondary | ICD-10-CM | POA: Diagnosis not present

## 2021-10-07 NOTE — Therapy (Signed)
OUTPATIENT PHYSICAL THERAPY LOWER EXTREMITY TREATMENT   Patient Name: Rebecca [name] MRN: [id number] DOB:11/11/1932, 88 y.o., female Today's Date: 10/07/2021   PT End of Session - 10/07/21 1520     Visit Number 5    Date for PT Re-Evaluation 11/25/21    PT Start Time 1448    PT Stop Time 1526    PT Time Calculation (min) 38 min    Activity Tolerance Patient tolerated treatment well    Behavior During Therapy WFL for tasks assessed/performed                 Past Medical History:  Diagnosis Date   Anemia    Anxiety    Bifascicular block    Diverticulitis    Diverticulosis    Gastroparesis    GERD (gastroesophageal reflux disease)    Glaucoma    Hemorrhoids    Hyperlipidemia    Hypertension    Hypertension    IBS (irritable bowel syndrome)    Rectal ulcer    SCOLIOSIS, LUMBAR SPINE 03/09/2010   Qualifier: Diagnosis of  By: Arnoldo Morale MD, John E    Segmental colitis [facility])    Tubular adenoma of colon 05/2009   Past Surgical History:  Procedure Laterality Date   APPENDECTOMY     ARCUATE KERATECTOMY     BILIARY DILATION  03/27/2021   Procedure: BILIARY DILATION;  Surgeon: [name], MD;  Location: [facility];  Service: Gastroenterology;;   CATARACT EXTRACTION, BILATERAL  2015   CHOLECYSTECTOMY N/A 03/28/2021   Procedure: LAPAROSCOPIC CHOLECYSTECTOMY;  Surgeon: Leighton Ruff, MD;  Location: [facility];  Service: General;  Laterality: N/A;   double pallital tori bone removed      ERCP N/A 03/27/2021   Procedure: ENDOSCOPIC RETROGRADE CHOLANGIOPANCREATOGRAPHY (ERCP);  Surgeon: [name], MD;  Location: [facility];  Service: Gastroenterology;  Laterality: N/A;   HERNIA REPAIR     umbilical   KNEE SURGERY Bilateral    morton's neuroma on rt foot and platar facial release     MOUTH SURGERY     "bone shaved off roof of mouth"   REMOVAL OF STONES  03/27/2021   Procedure: REMOVAL OF STONES;  Surgeon: [name], MD;  Location: [facility];   Service: Gastroenterology;;   SALIVARY GLAND SURGERY     removal   SPHINCTEROTOMY  03/27/2021   Procedure: SPHINCTEROTOMY;  Surgeon: [name], MD;  Location: [facility];  Service: Gastroenterology;;   TONSILLECTOMY     Patient Active Problem List   Diagnosis Date Noted   CLL (chronic lymphocytic leukemia) ([place]) 09/30/2021   Cholangitis 03/26/2021   Cholelithiasis 03/25/2021   Elevated LFTs 03/25/2021   Chronic hyponatremia 03/25/2021   Bilateral primary osteoarthritis of knee 06/11/2019   Pain in left shoulder 05/22/2019   Unspecified vitamin D deficiency 12/25/2013   SPINAL STENOSIS, LUMBAR 03/09/2010   Osteoporosis 03/09/2010   MIGRAINE, OPHTHALMIC 10/29/2009   ADENOMATOUS COLONIC POLYP 06/10/2009   History of iron deficiency 05/06/2009   Carotid artery disease ([place]) 07/28/2008   Allergic rhinitis 03/20/2008   DYSPHAGIA 03/12/2008   Ganglion of tendon sheath 08/17/2007   Hypothyroidism 11/15/2006   Hyperlipidemia 11/07/2006   Essential hypertension 10/18/2006   GERD 10/18/2006   DIVERTICULOSIS, COLON 10/18/2006    PCP: [name], MD   REFERRING PROVIDER: [name], MD   REFERRING DIAG: R29.898 (ICD-10-CM) - Weakness of both lower extremities   THERAPY DIAG:  Chronic bilateral low back pain without sciatica  Pain in  left hip  Chronic bilateral low back pain with right-sided sciatica  Difficulty in walking, not elsewhere classified  Muscle weakness (generalized)  Rationale for Evaluation and Treatment Rehabilitation  ONSET DATE: 09/09/2021   SUBJECTIVE:   SUBJECTIVE STATEMENT: Patient reports she was not too sore yesterday after treatment.   PERTINENT HISTORY: L hip fracture. L4, L5 with nerve impingement on the R, per patient. 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 No active treatment.  PAIN:  Are you having pain? Yes: NPRS scale: 4/10 Pain location: R hip and  foot Pain description: pain in hip, numbness in foot Aggravating factors: activity, bending Relieving factors: heating pad, avoid ben=ding  PRECAUTIONS: None  WEIGHT BEARING RESTRICTIONS No  FALLS:  Has patient fallen in last 6 months? No  LIVING ENVIRONMENT: Lives with: lives with their family and lives alone Lives in: House/apartment Stairs: Yes: External: 1 steps; none Has following equipment at home: Single point cane  OCCUPATION: retired  PLOF: Independent  PATIENT GOALS Walk straight, preferably without a cane   OBJECTIVE:   DIAGNOSTIC FINDINGS: N/A  COGNITION:  Overall cognitive status: Within functional limits for tasks assessed     SENSATION: Not tested  EDEMA: None   MUSCLE LENGTH: Hamstrings: Right Approx 75 degrees Thomas test: Slightly beyond neutral B.  POSTURE: forward head, decreased lumbar lordosis, decreased thoracic kyphosis, flexed trunk , and Scoliosis with rib hump on L, R paraspinal hump at lumbar region, R shoulder significantly lower than L  PALPATION: Tightness noted throughout paraspinals. TTP medial L scapular border, L gluts.  LOWER EXTREMITY ROM: BLE ROM generally WFL, but R hip mildly diminished and painful.      LOWER EXTREMITY MMT:  MMT Right eval Left eval  Hip flexion 3+ 4  Hip extension 3 4-  Hip abduction 3 4-  Hip adduction    Hip internal rotation    Hip external rotation    Knee flexion 3+ 4  Knee extension 4- 4  Ankle dorsiflexion 4 4  Ankle plantarflexion    Ankle inversion    Ankle eversion     (Blank rows = not tested)    FUNCTIONAL TESTS:  5 times sit to stand: 22.00 Timed up and go (TUG): 19.28  GAIT: Distance walked: 69' Assistive device utilized: Single point cane Level of assistance: Modified independence Comments: Small steps, shuffling, slow steps, patient reports she feels unsteady.    TODAY'S TREATMENT: 10/07/21 Sit <> stand x 10 reps, no hands,. Seated trunk mobilization- Large  physioball at her back, practiced ant/post lower body weight shifts with ball and therapist providing tactile cues. Seated rotational weight shifts with hands placed on mat to her R. Weight shifts forward and back into diagonal x 6 reps with therapist facilitating lower trunk weight shifts in diagonal. Returned to seated lower trunk mobilizations with improved excursion, placed hands behind hips and added upper body ext 6 x 5 sec Supine chin tucks to elongate posterior neck, 8 x 10 sec. Step ups on 4" step with BUE support x 10, then side step ups with UUE support x 10 each.    10/06/21 Nu-Step L3 x 6 mins Sitting LE stretching- HS, figure 4, ITB. Patient had great difficulty achieving a stretch. Facilitated lower trunk mobilizations-ant/post, and lateral. Again, great diffiuclty, required max TC and VC on physiodisc. Ambulated in assessment with cane in R hand, L hand and holding cane up with BUE. She maintains L lateral trunk flexion, and forward  flex, slightly improved while holding the cane. Her trunk is very immobile in any direction.  09/29/21 Nustep L3 x11mns Supine K2C 30s HS stretch 30s  Piriformis stretch 30s  Feet on ball trunk rotations 168m SLR x10, 2#x10 LAQ 2# 2x10 2 way hip 2# 2x10    STS 2x10 Step ups 4" w/1HHA   09/22/21 Nu-Step L3 x 6 minutes Supine stretches SKTC, Active HS, figure 4, ITB, 30 sec each side. Supine strength- bridging, SLR, SLR with ER, 10 each Standing hip flexor stretch facing wall, mini squats x 10, rows, sh ext, sh ER with Red Tband  09/16/21 Patient education   PATIENT EDUCATION:  Education details: HEP Person educated: Patient Education method: ExConsulting civil engineerdeSystems developerhandout Education comprehension: verbalized understanding, return demo   HOME EXERCISE PROGRAM: [id number]:  CLINICAL IMPRESSION: Emphasized trunk mobility today, with much improved lower trunk movement. Also addressed upper trunk ext with improved shoulder ext,  but continued forward head.   OBJECTIVE IMPAIRMENTS Abnormal gait, decreased activity tolerance, decreased balance, decreased coordination, decreased mobility, difficulty walking, decreased ROM, decreased strength, impaired flexibility, improper body mechanics, postural dysfunction, and pain.   ACTIVITY LIMITATIONS carrying, lifting, bending, standing, squatting, sleeping, stairs, reach over head, and locomotion level  PARTICIPATION LIMITATIONS: meal prep, cleaning, laundry, shopping, and yard work  PERSONAL FACTORS Age, Past/current experiences, and Time since onset of injury/illness/exacerbation are also affecting patient's functional outcome.   REHAB POTENTIAL: Good  CLINICAL DECISION MAKING: Stable/uncomplicated  EVALUATION COMPLEXITY: Low   GOALS: Goals reviewed with patient? Yes  SHORT TERM GOALS: Target date: 10/14/2021  I with basic HEP Baseline: initiated Goal status: ongoing  LONG TERM GOALS: Target date: 11/25/2021   I with final HEP Baseline:  Goal status: INITIAL  2.  Perform TUG in < 12 seconds to demonstrate improved balance Baseline: 19.28 Goal status: INITIAL  3.  Perform 5 x STS in < 12 seconds to demonstrate improved BLE strength Baseline: 22.00 Goal status: INITIAL  4.  Patient will ambulate x at least 500' using LRAD, MI, on level and unlevel surfaces, demonstrating normalized gait pattern Baseline: Small steps, shuffling, slow steps, patient reports she feels unsteady. Goal status: INITIAL  5.  Patient will report LBP of <3/10 at any point during the day or evening. Baseline: Up to 6/10, 4/10 today Goal status: INITIAL    PLAN: PT FREQUENCY: 2x/week  PT DURATION: 10 weeks  PLANNED INTERVENTIONS: Therapeutic exercises, Therapeutic activity, Neuromuscular re-education, Balance training, Gait training, Patient/Family education, Joint mobilization, Dry Needling, Electrical stimulation, Cryotherapy, Moist heat, Traction, Ionotophoresis '4mg'$ /ml  Dexamethasone, and Manual therapy  PLAN FOR NEXT SESSION:  strengthening.   SuMarcelina MorelDPT 10/07/2021, 3:27 PM

## 2021-10-12 ENCOUNTER — Ambulatory Visit: Payer: Medicare Other | Admitting: Orthopaedic Surgery

## 2021-10-13 ENCOUNTER — Ambulatory Visit (INDEPENDENT_AMBULATORY_CARE_PROVIDER_SITE_OTHER): Payer: Medicare Other | Admitting: Physician Assistant

## 2021-10-13 ENCOUNTER — Ambulatory Visit: Payer: Medicare Other

## 2021-10-13 ENCOUNTER — Encounter: Payer: Self-pay | Admitting: Physician Assistant

## 2021-10-13 VITALS — BP 134/68 | HR 47 | Temp 97.6°F | Ht 62.0 in | Wt 119.4 lb

## 2021-10-13 DIAGNOSIS — S90811A Abrasion, right foot, initial encounter: Secondary | ICD-10-CM | POA: Diagnosis not present

## 2021-10-13 NOTE — Progress Notes (Signed)
Subjective:    Patient ID: Rebecca [name], female    DOB: 03/01/1933, 88 y.o.   MRN: [id number]  Chief Complaint  Patient presents with   Foot Pain    Pt coming in due to pain on ball of foot; possible bunion and using pads round pads in the area; layers of skin came off with it when she tried to take it off; pt is afraid of getting infection; pt having trouble walking on the right foot    Foot Pain   Patient is in today for right foot pain x 3 days. States she took a bunion pad off the area on Sunday night and ripped skin off with it. Hurts to walk on. Nervous about infection developing. No fever. No numbness in foot. No redness or discharge.   Past Medical History:  Diagnosis Date   Anemia    Anxiety    Bifascicular block    Diverticulitis    Diverticulosis    Gastroparesis    GERD (gastroesophageal reflux disease)    Glaucoma    Hemorrhoids    Hyperlipidemia    Hypertension    Hypertension    IBS (irritable bowel syndrome)    Rectal ulcer    SCOLIOSIS, LUMBAR SPINE 03/09/2010   Qualifier: Diagnosis of  By: Arnoldo Morale MD, John E    Segmental colitis [facility])    Tubular adenoma of colon 05/2009    Past Surgical History:  Procedure Laterality Date   APPENDECTOMY     ARCUATE KERATECTOMY     BILIARY DILATION  03/27/2021   Procedure: BILIARY DILATION;  Surgeon: [name], MD;  Location: [facility];  Service: Gastroenterology;;   CATARACT EXTRACTION, BILATERAL  2015   CHOLECYSTECTOMY N/A 03/28/2021   Procedure: LAPAROSCOPIC CHOLECYSTECTOMY;  Surgeon: Leighton Ruff, MD;  Location: [facility];  Service: General;  Laterality: N/A;   double pallital tori bone removed      ERCP N/A 03/27/2021   Procedure: ENDOSCOPIC RETROGRADE CHOLANGIOPANCREATOGRAPHY (ERCP);  Surgeon: [name], MD;  Location: [facility];  Service: Gastroenterology;  Laterality: N/A;   HERNIA REPAIR     umbilical   KNEE SURGERY Bilateral    morton's neuroma on rt foot and platar facial  release     MOUTH SURGERY     "bone shaved off roof of mouth"   REMOVAL OF STONES  03/27/2021   Procedure: REMOVAL OF STONES;  Surgeon: [name], MD;  Location: [facility];  Service: Gastroenterology;;   SALIVARY GLAND SURGERY     removal   SPHINCTEROTOMY  03/27/2021   Procedure: SPHINCTEROTOMY;  Surgeon: [name], MD;  Location: [facility];  Service: Gastroenterology;;   TONSILLECTOMY      Family History  Problem Relation Age of Onset   Diabetes Mother    Heart disease Mother    Hyperlipidemia Mother    Hypertension Mother    Anuerysm Father        abdominal- heavy smoker   Heart disease Father    Hypertension Father    Colon polyps Sister    Lung cancer Sister        smoker   Colon polyps Brother    Diabetes Brother    Hyperlipidemia Brother    Hypertension Brother    Heart disease Brother    Colon cancer Paternal Grandmother        with possible stomach cancer   Migraines Daughter    Kidney Stones Daughter    Colon polyps Daughter  Breast cancer Daughter    Cancer Daughter        salivary   Colon cancer Maternal Aunt        Rectal cancer   Crohn's disease Neg Hx    Pancreatic cancer Neg Hx     Social History   Tobacco Use   Smoking status: Never   Smokeless tobacco: Never  Vaping Use   Vaping Use: Never used  Substance Use Topics   Alcohol use: Yes    Alcohol/week: 0.0 standard drinks of alcohol    Comment: rare   Drug use: No     Allergies  Allergen Reactions   Levaquin [Levofloxacin] Other (See Comments)    Very nauseous and shakey   Sulfamethoxazole-Trimethoprim Nausea Only    shakey   Lactose Intolerance (Gi) Diarrhea and Other (See Comments)    Stomach hurts    Tramadol Hcl Nausea Only and Palpitations   Augmentin [Amoxicillin-Pot Clavulanate] Diarrhea   Ciprofloxacin Nausea Only    REACTION: Thrush, Nausea, Shakey   Pneumococcal Vaccine Polyvalent     REACTION: RED AND RAISED RASH ON ARM   Zosyn [Piperacillin  Sod-Tazobactam So] Diarrhea   Adhesive [Tape] Rash   Codeine Nausea Only    Review of Systems NEGATIVE UNLESS OTHERWISE INDICATED IN HPI      Objective:     BP 134/68 (BP Location: Right Arm)   Pulse (!) 47 Comment: pt states taking medicatio for gluacoma makes HR low  Temp 97.6 F (36.4 C) (Temporal)   Ht '5\' 2"'$  (1.575 m)   Wt 119 lb 6.4 oz (54.2 kg)   SpO2 98%   BMI 21.84 kg/m   Wt Readings from Last 3 Encounters:  10/13/21 119 lb 6.4 oz (54.2 kg)  09/30/21 121 lb (54.9 kg)  09/16/21 117 lb (53.1 kg)    BP Readings from Last 3 Encounters:  10/13/21 134/68  09/30/21 132/68  09/16/21 130/68     Physical Exam Constitutional:      Appearance: Normal appearance.  Musculoskeletal:     Right foot: Normal range of motion and normal capillary refill. Normal pulse.     Comments: Small abrasion R plantar surface of foot near base of 1st and 2nd digit. R foot N/V intact. No surrounding redness or drainage.   Neurological:     Mental Status: She is alert.        Assessment & Plan:   Problem List Items Addressed This Visit   None Visit Diagnoses     Abrasion, right foot, initial encounter    -  Primary      1. Abrasion, right foot, initial encounter -Not diabetic, not anticoagulated; will update Tdap at pharmacy -Reassured no signs of infection -Dressed in office today; AVS provided with additional care  Recheck prn     Zadie Deemer M Vickie Ponds, PA-C

## 2021-10-13 NOTE — Patient Instructions (Signed)
No signs of infection! Please have your Tetanus shot updated at your pharmacy. Keep non-stick bandage with Vaseline over the area anytime you will be up and about. Wash once daily with soap and water, pat dry. If redness or fevers or odorous discharge, recheck with [place].  Take care, Thanks, Atzin Buchta, PA-C

## 2021-10-14 ENCOUNTER — Telehealth: Payer: Self-pay | Admitting: Family Medicine

## 2021-10-14 NOTE — Telephone Encounter (Signed)
Returned pt call and advised per Alyssa this would take approx 1 wk to heal; if resting and sitting it is fine to remove wrap, however if up walking around it would best to keep wrapped. Pt verbalized understanding.

## 2021-10-14 NOTE — Telephone Encounter (Signed)
Patient requests to be called at ph# [phone] to let Patient know how long Patient must keep the ball of her wrapped and approximately how long will it take to heal.

## 2021-10-15 ENCOUNTER — Ambulatory Visit: Payer: Medicare Other | Admitting: Physical Therapy

## 2021-10-19 ENCOUNTER — Ambulatory Visit: Payer: Medicare Other | Admitting: Orthopaedic Surgery

## 2021-10-19 ENCOUNTER — Ambulatory Visit: Payer: Medicare Other

## 2021-10-20 ENCOUNTER — Ambulatory Visit: Payer: Medicare Other | Admitting: Physical Therapy

## 2021-10-25 ENCOUNTER — Ambulatory Visit: Payer: Medicare Other | Admitting: Physical Therapy

## 2021-10-25 DIAGNOSIS — N816 Rectocele: Secondary | ICD-10-CM | POA: Diagnosis not present

## 2021-10-25 DIAGNOSIS — N952 Postmenopausal atrophic vaginitis: Secondary | ICD-10-CM | POA: Diagnosis not present

## 2021-10-25 DIAGNOSIS — Z4689 Encounter for fitting and adjustment of other specified devices: Secondary | ICD-10-CM | POA: Diagnosis not present

## 2021-10-25 DIAGNOSIS — N950 Postmenopausal bleeding: Secondary | ICD-10-CM | POA: Diagnosis not present

## 2021-10-26 DIAGNOSIS — H401122 Primary open-angle glaucoma, left eye, moderate stage: Secondary | ICD-10-CM | POA: Diagnosis not present

## 2021-10-26 DIAGNOSIS — H401113 Primary open-angle glaucoma, right eye, severe stage: Secondary | ICD-10-CM | POA: Diagnosis not present

## 2021-10-27 ENCOUNTER — Encounter: Payer: Self-pay | Admitting: Podiatry

## 2021-10-27 ENCOUNTER — Ambulatory Visit (INDEPENDENT_AMBULATORY_CARE_PROVIDER_SITE_OTHER): Payer: Medicare Other | Admitting: Podiatry

## 2021-10-27 DIAGNOSIS — Q828 Other specified congenital malformations of skin: Secondary | ICD-10-CM | POA: Diagnosis not present

## 2021-10-27 DIAGNOSIS — M216X9 Other acquired deformities of unspecified foot: Secondary | ICD-10-CM

## 2021-10-27 DIAGNOSIS — M2041 Other hammer toe(s) (acquired), right foot: Secondary | ICD-10-CM

## 2021-10-28 ENCOUNTER — Encounter: Payer: Self-pay | Admitting: Physical Therapy

## 2021-10-28 ENCOUNTER — Ambulatory Visit: Payer: Medicare Other | Admitting: Physical Therapy

## 2021-10-28 ENCOUNTER — Ambulatory Visit: Payer: Medicare Other | Admitting: Orthopaedic Surgery

## 2021-10-28 DIAGNOSIS — M6281 Muscle weakness (generalized): Secondary | ICD-10-CM

## 2021-10-28 DIAGNOSIS — M545 Low back pain, unspecified: Secondary | ICD-10-CM | POA: Diagnosis not present

## 2021-10-28 DIAGNOSIS — G8929 Other chronic pain: Secondary | ICD-10-CM

## 2021-10-28 DIAGNOSIS — M25552 Pain in left hip: Secondary | ICD-10-CM

## 2021-10-28 DIAGNOSIS — R262 Difficulty in walking, not elsewhere classified: Secondary | ICD-10-CM | POA: Diagnosis not present

## 2021-10-28 DIAGNOSIS — M5441 Lumbago with sciatica, right side: Secondary | ICD-10-CM | POA: Diagnosis not present

## 2021-10-28 NOTE — Therapy (Signed)
OUTPATIENT PHYSICAL THERAPY LOWER EXTREMITY TREATMENT   Patient Name: Rebecca [name] MRN: [id number] DOB:Jan 21, 1933, 88 y.o., female Today's Date: 10/28/2021   PT End of Session - 10/28/21 1517     Visit Number 6    Date for PT Re-Evaluation 11/25/21    PT Start Time 1448    PT Stop Time 1526    PT Time Calculation (min) 38 min    Activity Tolerance Patient tolerated treatment well    Behavior During Therapy WFL for tasks assessed/performed                  Past Medical History:  Diagnosis Date   Anemia    Anxiety    Bifascicular block    Diverticulitis    Diverticulosis    Gastroparesis    GERD (gastroesophageal reflux disease)    Glaucoma    Hemorrhoids    Hyperlipidemia    Hypertension    Hypertension    IBS (irritable bowel syndrome)    Rectal ulcer    SCOLIOSIS, LUMBAR SPINE 03/09/2010   Qualifier: Diagnosis of  By: Arnoldo Morale MD, John E    Segmental colitis [facility])    Tubular adenoma of colon 05/2009   Past Surgical History:  Procedure Laterality Date   APPENDECTOMY     ARCUATE KERATECTOMY     BILIARY DILATION  03/27/2021   Procedure: BILIARY DILATION;  Surgeon: [name], MD;  Location: [facility];  Service: Gastroenterology;;   CATARACT EXTRACTION, BILATERAL  2015   CHOLECYSTECTOMY N/A 03/28/2021   Procedure: LAPAROSCOPIC CHOLECYSTECTOMY;  Surgeon: Leighton Ruff, MD;  Location: [facility];  Service: General;  Laterality: N/A;   double pallital tori bone removed      ERCP N/A 03/27/2021   Procedure: ENDOSCOPIC RETROGRADE CHOLANGIOPANCREATOGRAPHY (ERCP);  Surgeon: [name], MD;  Location: [facility];  Service: Gastroenterology;  Laterality: N/A;   HERNIA REPAIR     umbilical   KNEE SURGERY Bilateral    morton's neuroma on rt foot and platar facial release     MOUTH SURGERY     "bone shaved off roof of mouth"   REMOVAL OF STONES  03/27/2021   Procedure: REMOVAL OF STONES;  Surgeon: [name], MD;  Location: [facility];   Service: Gastroenterology;;   SALIVARY GLAND SURGERY     removal   SPHINCTEROTOMY  03/27/2021   Procedure: SPHINCTEROTOMY;  Surgeon: [name], MD;  Location: [facility];  Service: Gastroenterology;;   TONSILLECTOMY     Patient Active Problem List   Diagnosis Date Noted   CLL (chronic lymphocytic leukemia) ([place]) 09/30/2021   Cholangitis 03/26/2021   Cholelithiasis 03/25/2021   Elevated LFTs 03/25/2021   Chronic hyponatremia 03/25/2021   Bilateral primary osteoarthritis of knee 06/11/2019   Pain in left shoulder 05/22/2019   Unspecified vitamin D deficiency 12/25/2013   SPINAL STENOSIS, LUMBAR 03/09/2010   Osteoporosis 03/09/2010   MIGRAINE, OPHTHALMIC 10/29/2009   ADENOMATOUS COLONIC POLYP 06/10/2009   History of iron deficiency 05/06/2009   Carotid artery disease ([place]) 07/28/2008   Allergic rhinitis 03/20/2008   DYSPHAGIA 03/12/2008   Ganglion of tendon sheath 08/17/2007   Hypothyroidism 11/15/2006   Hyperlipidemia 11/07/2006   Essential hypertension 10/18/2006   GERD 10/18/2006   DIVERTICULOSIS, COLON 10/18/2006    PCP: [name], MD   REFERRING PROVIDER: [name], MD   REFERRING DIAG: R29.898 (ICD-10-CM) - Weakness of both lower extremities   THERAPY DIAG:  Chronic bilateral low back pain without sciatica  Difficulty  in walking, not elsewhere classified  Muscle weakness (generalized)  Pain in left hip  Chronic bilateral low back pain with right-sided sciatica  Rationale for Evaluation and Treatment Rehabilitation  ONSET DATE: 09/09/2021   SUBJECTIVE:   SUBJECTIVE STATEMENT: Patient reports that her foot has been very sore, causing her to miss several treatments. She had a cushioning pad on her foot and when she pulled the pad off, it ripped the skin off, causing it to bleed. She could not WB for a couple weeks. She finally got in to see a Podiatrist who examined it and told her she could walk normally. She had to cancel her  appointments for her knee injections.   PERTINENT HISTORY: L hip fracture. L4, L5 with nerve impingement on the R, per patient. 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 No active treatment.  PAIN:  Are you having pain? Yes: NPRS scale: 4/10 Pain location: R hip and foot Pain description: pain in hip, numbness in foot Aggravating factors: activity, bending Relieving factors: heating pad, avoid ben=ding  PRECAUTIONS: None  WEIGHT BEARING RESTRICTIONS No  FALLS:  Has patient fallen in last 6 months? No  LIVING ENVIRONMENT: Lives with: lives with their family and lives alone Lives in: House/apartment Stairs: Yes: External: 1 steps; none Has following equipment at home: Single point cane  OCCUPATION: retired  PLOF: Independent  PATIENT GOALS Walk straight, preferably without a cane   OBJECTIVE:   DIAGNOSTIC FINDINGS: N/A  COGNITION:  Overall cognitive status: Within functional limits for tasks assessed     SENSATION: Not tested  EDEMA: None   MUSCLE LENGTH: Hamstrings: Right Approx 75 degrees Thomas test: Slightly beyond neutral B.  POSTURE: forward head, decreased lumbar lordosis, decreased thoracic kyphosis, flexed trunk , and Scoliosis with rib hump on L, R paraspinal hump at lumbar region, R shoulder significantly lower than L  PALPATION: Tightness noted throughout paraspinals. TTP medial L scapular border, L gluts.  LOWER EXTREMITY ROM: BLE ROM generally WFL, but R hip mildly diminished and painful.      LOWER EXTREMITY MMT:  MMT Right eval Left eval  Hip flexion 3+ 4  Hip extension 3 4-  Hip abduction 3 4-  Hip adduction    Hip internal rotation    Hip external rotation    Knee flexion 3+ 4  Knee extension 4- 4  Ankle dorsiflexion 4 4  Ankle plantarflexion    Ankle inversion    Ankle eversion     (Blank rows = not tested)    FUNCTIONAL TESTS:  5 times sit to stand: 22.00 Timed up  and go (TUG): 19.28  GAIT: Distance walked: 49' Assistive device utilized: Single point cane Level of assistance: Modified independence Comments: Small steps, shuffling, slow steps, patient reports she feels unsteady.    TODAY'S TREATMENT: 10/28/21 NuStep L3 x 4 minutes, then 2 x 20 seconds at L5 Sit on physiodisc- ant/post, lat lower body weight shifts with improved control noted. Static sitting on disc while reaching up and then into diagonals with BUE holding 2# ball 10 reps each. Side to side stepping with red T band resistance around knees, 10 reps to each side, B HHA 4 square stepping 5 times in each direction iwht straight cane and CGA, mild unsteadiness. Walked 100' with straight cane and MI demosntrating improved upright posture after treatment.   10/07/21 Sit <> stand x 10 reps, no hands,. Seated trunk mobilization- Large physioball at her back, practiced  ant/post lower body weight shifts with ball and therapist providing tactile cues. Seated rotational weight shifts with hands placed on mat to her R. Weight shifts forward and back into diagonal x 6 reps with therapist facilitating lower trunk weight shifts in diagonal. Returned to seated lower trunk mobilizations with improved excursion, placed hands behind hips and added upper body ext 6 x 5 sec Supine chin tucks to elongate posterior neck, 8 x 10 sec. Step ups on 4" step with BUE support x 10, then side step ups with UUE support x 10 each.    10/06/21 Nu-Step L3 x 6 mins Sitting LE stretching- HS, figure 4, ITB. Patient had great difficulty achieving a stretch. Facilitated lower trunk mobilizations-ant/post, and lateral. Again, great diffiuclty, required max TC and VC on physiodisc. Ambulated in assessment with cane in R hand, L hand and holding cane up with BUE. She maintains L lateral trunk flexion, and forward flex, slightly improved while holding the cane. Her trunk is very immobile in any direction.  09/29/21 Nustep L3  x60mns Supine K2C 30s HS stretch 30s  Piriformis stretch 30s  Feet on ball trunk rotations 183m SLR x10, 2#x10 LAQ 2# 2x10 2 way hip 2# 2x10    STS 2x10 Step ups 4" w/1HHA   09/22/21 Nu-Step L3 x 6 minutes Supine stretches SKTC, Active HS, figure 4, ITB, 30 sec each side. Supine strength- bridging, SLR, SLR with ER, 10 each Standing hip flexor stretch facing wall, mini squats x 10, rows, sh ext, sh ER with Red Tband  09/16/21 Patient education   PATIENT EDUCATION:  Education details: HEP Person educated: Patient Education method: ExConsulting civil engineerdeSystems developerhandout Education comprehension: verbalized understanding, return demo   HOME EXERCISE PROGRAM: [id number]:  CLINICAL IMPRESSION: Patient returns after missing over 2 weeks due to a foot injury, which is resolved. Continued with trunk and postural stability activities, as well as LE strength and balance to try to recover her deficits.   OBJECTIVE IMPAIRMENTS Abnormal gait, decreased activity tolerance, decreased balance, decreased coordination, decreased mobility, difficulty walking, decreased ROM, decreased strength, impaired flexibility, improper body mechanics, postural dysfunction, and pain.   ACTIVITY LIMITATIONS carrying, lifting, bending, standing, squatting, sleeping, stairs, reach over head, and locomotion level  PARTICIPATION LIMITATIONS: meal prep, cleaning, laundry, shopping, and yard work  PERSONAL FACTORS Age, Past/current experiences, and Time since onset of injury/illness/exacerbation are also affecting patient's functional outcome.   REHAB POTENTIAL: Good  CLINICAL DECISION MAKING: Stable/uncomplicated  EVALUATION COMPLEXITY: Low   GOALS: Goals reviewed with patient? Yes  SHORT TERM GOALS: Target date: 10/14/2021  I with basic HEP Baseline: initiated Goal status: ongoing  LONG TERM GOALS: Target date: 11/25/2021   I with final HEP Baseline:  Goal status: INITIAL  2.  Perform TUG in <  12 seconds to demonstrate improved balance Baseline: 19.28 Goal status: INITIAL  3.  Perform 5 x STS in < 12 seconds to demonstrate improved BLE strength Baseline: 22.00 Goal status: INITIAL  4.  Patient will ambulate x at least 500' using LRAD, MI, on level and unlevel surfaces, demonstrating normalized gait pattern Baseline: Small steps, shuffling, slow steps, patient reports she feels unsteady. Goal status: INITIAL  5.  Patient will report LBP of <3/10 at any point during the day or evening. Baseline: Up to 6/10, 4/10 today Goal status: INITIAL    PLAN: PT FREQUENCY: 2x/week  PT DURATION: 10 weeks  PLANNED INTERVENTIONS: Therapeutic exercises, Therapeutic activity, Neuromuscular re-education, Balance training, Gait training, Patient/Family education, Joint mobilization,  Dry Needling, Electrical stimulation, Cryotherapy, Moist heat, Traction, Ionotophoresis '4mg'$ /ml Dexamethasone, and Manual therapy  PLAN FOR NEXT SESSION:  strengthening.   Marcelina Morel, DPT 10/28/2021, 3:29 PM

## 2021-10-28 NOTE — Progress Notes (Signed)
Subjective:   Patient ID: Rebecca [name], female   DOB: 88 y.o.   MRN: [id number]   HPI Patient presents stating that she gets a lesion on the bottom of the right foot which gets sore and she tries to use medicated corn pads and feels like she is walking on her bones and is not sure what might be causing this.  Patient does try to wear relative cushion shoe gear.  Patient does not smoke would like to be more active   Review of Systems  All other systems reviewed and are negative.       Objective:  Physical Exam Vitals and nursing note reviewed.  Constitutional:      Appearance: She is well-developed.  Pulmonary:     Effort: Pulmonary effort is normal.  Musculoskeletal:        General: Normal range of motion.  Skin:    General: Skin is warm.  Neurological:     Mental Status: She is alert.     Neurological sensation mildly diminished with patient found to have vascular status intact but low.  Patient does have significant bone structure exposure in the forefoot with a diminished fat pad and has lesion subfirst metatarsal right keratotic with a lucent type core.  Patient has good digital perfusion well oriented x3     Assessment:  Difficult cases I do think were dealing with structural changes here and also lesion formation with significant diminishment of the fat pad     Plan:  H&P condition reviewed and discussed.  There is no good answer for this condition except for trying to increase cushion and padding careful debridement and patient will use padding even though I am concerned about her using anything with acid on it.  Patient will be seen back as needed with all questions answered today

## 2021-11-01 ENCOUNTER — Telehealth: Payer: Self-pay | Admitting: Orthopaedic Surgery

## 2021-11-01 NOTE — Telephone Encounter (Signed)
Appts scheduled:

## 2021-11-01 NOTE — Telephone Encounter (Signed)
Pt called and is wondering an update on her bil gel injections   She wants the series of 3 injection  Cb [phone]

## 2021-11-02 ENCOUNTER — Ambulatory Visit: Payer: Medicare Other | Attending: Family Medicine | Admitting: Physical Therapy

## 2021-11-02 ENCOUNTER — Encounter: Payer: Self-pay | Admitting: Physical Therapy

## 2021-11-02 DIAGNOSIS — M25552 Pain in left hip: Secondary | ICD-10-CM | POA: Diagnosis not present

## 2021-11-02 DIAGNOSIS — R262 Difficulty in walking, not elsewhere classified: Secondary | ICD-10-CM | POA: Insufficient documentation

## 2021-11-02 DIAGNOSIS — M545 Low back pain, unspecified: Secondary | ICD-10-CM | POA: Diagnosis not present

## 2021-11-02 DIAGNOSIS — G8929 Other chronic pain: Secondary | ICD-10-CM | POA: Diagnosis not present

## 2021-11-02 DIAGNOSIS — M6281 Muscle weakness (generalized): Secondary | ICD-10-CM | POA: Insufficient documentation

## 2021-11-02 DIAGNOSIS — M5441 Lumbago with sciatica, right side: Secondary | ICD-10-CM | POA: Insufficient documentation

## 2021-11-02 NOTE — Therapy (Signed)
OUTPATIENT PHYSICAL THERAPY LOWER EXTREMITY TREATMENT   Patient Name: MONITA SWIER MRN: [id number] DOB:15-Apr-1932, 88 y.o., female Today's Date: 11/02/2021   PT End of Session - 11/02/21 1333     Visit Number 7    Date for PT Re-Evaluation 11/25/21    PT Start Time 1319    PT Stop Time 6962    PT Time Calculation (min) 39 min    Activity Tolerance Patient tolerated treatment well    Behavior During Therapy WFL for tasks assessed/performed                   Past Medical History:  Diagnosis Date   Anemia    Anxiety    Bifascicular block    Diverticulitis    Diverticulosis    Gastroparesis    GERD (gastroesophageal reflux disease)    Glaucoma    Hemorrhoids    Hyperlipidemia    Hypertension    Hypertension    IBS (irritable bowel syndrome)    Rectal ulcer    SCOLIOSIS, LUMBAR SPINE 03/09/2010   Qualifier: Diagnosis of  By: Arnoldo Morale MD, John E    Segmental colitis [facility])    Tubular adenoma of colon 05/2009   Past Surgical History:  Procedure Laterality Date   APPENDECTOMY     ARCUATE KERATECTOMY     BILIARY DILATION  03/27/2021   Procedure: BILIARY DILATION;  Surgeon: [name], MD;  Location: [facility];  Service: Gastroenterology;;   CATARACT EXTRACTION, BILATERAL  2015   CHOLECYSTECTOMY N/A 03/28/2021   Procedure: LAPAROSCOPIC CHOLECYSTECTOMY;  Surgeon: Leighton Ruff, MD;  Location: [facility];  Service: General;  Laterality: N/A;   double pallital tori bone removed      ERCP N/A 03/27/2021   Procedure: ENDOSCOPIC RETROGRADE CHOLANGIOPANCREATOGRAPHY (ERCP);  Surgeon: [name], MD;  Location: [facility];  Service: Gastroenterology;  Laterality: N/A;   HERNIA REPAIR     umbilical   KNEE SURGERY Bilateral    morton's neuroma on rt foot and platar facial release     MOUTH SURGERY     "bone shaved off roof of mouth"   REMOVAL OF STONES  03/27/2021   Procedure: REMOVAL OF STONES;  Surgeon: [name], MD;  Location: [facility];   Service: Gastroenterology;;   SALIVARY GLAND SURGERY     removal   SPHINCTEROTOMY  03/27/2021   Procedure: SPHINCTEROTOMY;  Surgeon: [name], MD;  Location: [facility];  Service: Gastroenterology;;   TONSILLECTOMY     Patient Active Problem List   Diagnosis Date Noted   CLL (chronic lymphocytic leukemia) ([place]) 09/30/2021   Cholangitis 03/26/2021   Cholelithiasis 03/25/2021   Elevated LFTs 03/25/2021   Chronic hyponatremia 03/25/2021   Bilateral primary osteoarthritis of knee 06/11/2019   Pain in left shoulder 05/22/2019   Unspecified vitamin D deficiency 12/25/2013   SPINAL STENOSIS, LUMBAR 03/09/2010   Osteoporosis 03/09/2010   MIGRAINE, OPHTHALMIC 10/29/2009   ADENOMATOUS COLONIC POLYP 06/10/2009   History of iron deficiency 05/06/2009   Carotid artery disease ([place]) 07/28/2008   Allergic rhinitis 03/20/2008   DYSPHAGIA 03/12/2008   Ganglion of tendon sheath 08/17/2007   Hypothyroidism 11/15/2006   Hyperlipidemia 11/07/2006   Essential hypertension 10/18/2006   GERD 10/18/2006   DIVERTICULOSIS, COLON 10/18/2006    PCP: [name], MD   REFERRING PROVIDER: [name], MD   REFERRING DIAG: R29.898 (ICD-10-CM) - Weakness of both lower extremities   THERAPY DIAG:  Chronic bilateral low back pain without sciatica  Difficulty in walking, not elsewhere classified  Muscle weakness (generalized)  Chronic bilateral low back pain with right-sided sciatica  Pain in left hip  Rationale for Evaluation and Treatment Rehabilitation  ONSET DATE: 09/09/2021   SUBJECTIVE:   SUBJECTIVE STATEMENT: Patient reports no real changes since last appointment. She has new appointments scheduled for injections, but not until Sept.   PERTINENT HISTORY: L hip fracture. L4, L5 with nerve impingement on the R, per patient. 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 No active treatment.  PAIN:  Are you  having pain? Yes: NPRS scale: 4/10 Pain location: R hip and foot Pain description: pain in hip, numbness in foot Aggravating factors: activity, bending Relieving factors: heating pad, avoid ben=ding  PRECAUTIONS: None  WEIGHT BEARING RESTRICTIONS No  FALLS:  Has patient fallen in last 6 months? No  LIVING ENVIRONMENT: Lives with: lives with their family and lives alone Lives in: House/apartment Stairs: Yes: External: 1 steps; none Has following equipment at home: Single point cane  OCCUPATION: retired  PLOF: Independent  PATIENT GOALS Walk straight, preferably without a cane   OBJECTIVE:   DIAGNOSTIC FINDINGS: N/A  COGNITION:  Overall cognitive status: Within functional limits for tasks assessed     SENSATION: Not tested  EDEMA: None   MUSCLE LENGTH: Hamstrings: Right Approx 75 degrees Thomas test: Slightly beyond neutral B.  POSTURE: forward head, decreased lumbar lordosis, decreased thoracic kyphosis, flexed trunk , and Scoliosis with rib hump on L, R paraspinal hump at lumbar region, R shoulder significantly lower than L  PALPATION: Tightness noted throughout paraspinals. TTP medial L scapular border, L gluts.  LOWER EXTREMITY ROM: BLE ROM generally WFL, but R hip mildly diminished and painful.      LOWER EXTREMITY MMT:  MMT Right eval Left eval  Hip flexion 3+ 4  Hip extension 3 4-  Hip abduction 3 4-  Hip adduction    Hip internal rotation    Hip external rotation    Knee flexion 3+ 4  Knee extension 4- 4  Ankle dorsiflexion 4 4  Ankle plantarflexion    Ankle inversion    Ankle eversion     (Blank rows = not tested)    FUNCTIONAL TESTS:  5 times sit to stand: 22.00 Timed up and go (TUG): 19.28  GAIT: Distance walked: 97' Assistive device utilized: Single point cane Level of assistance: Modified independence Comments: Small steps, shuffling, slow steps, patient reports she feels unsteady.   TODAY'S TREATMENT: 11/01/21 Recumbent  cycling L1 x 4 minutes Sit to stand from elevated mat while holding 1# ball in BUE 1 x 10 Side to side step with red Tband resistance at knees, x 10 reps, HHA. Sid e to side quick steps Forward and back quick steps Quick steps changing directions upon command of therapist- All performed with B HHA. Step ups on 4" step with B UE support on rails 10 reps each leg Lateral step ups on 4" step, 10 each leg.  10/28/21 NuStep L3 x 4 minutes, then 2 x 20 seconds at L5 Sit on physiodisc- ant/post, lat lower body weight shifts with improved control noted. Static sitting on disc while reaching up and then into diagonals with BUE holding 2# ball 10 reps each. Side to side stepping with red T band resistance around knees, 10 reps to each side, B HHA 4 square stepping 5 times in each direction iwht straight cane and CGA, mild unsteadiness. Walked 100' with straight  cane and MI demosntrating improved upright posture after treatment.   10/07/21 Sit <> stand x 10 reps, no hands,. Seated trunk mobilization- Large physioball at her back, practiced ant/post lower body weight shifts with ball and therapist providing tactile cues. Seated rotational weight shifts with hands placed on mat to her R. Weight shifts forward and back into diagonal x 6 reps with therapist facilitating lower trunk weight shifts in diagonal. Returned to seated lower trunk mobilizations with improved excursion, placed hands behind hips and added upper body ext 6 x 5 sec Supine chin tucks to elongate posterior neck, 8 x 10 sec. Step ups on 4" step with BUE support x 10, then side step ups with UUE support x 10 each.    10/06/21 Nu-Step L3 x 6 mins Sitting LE stretching- HS, figure 4, ITB. Patient had great difficulty achieving a stretch. Facilitated lower trunk mobilizations-ant/post, and lateral. Again, great diffiuclty, required max TC and VC on physiodisc. Ambulated in assessment with cane in R hand, L hand and holding cane up with BUE.  She maintains L lateral trunk flexion, and forward flex, slightly improved while holding the cane. Her trunk is very immobile in any direction.  09/29/21 Nustep L3 x19mns Supine K2C 30s HS stretch 30s  Piriformis stretch 30s  Feet on ball trunk rotations 115m SLR x10, 2#x10 LAQ 2# 2x10 2 way hip 2# 2x10    STS 2x10 Step ups 4" w/1HHA   09/22/21 Nu-Step L3 x 6 minutes Supine stretches SKTC, Active HS, figure 4, ITB, 30 sec each side. Supine strength- bridging, SLR, SLR with ER, 10 each Standing hip flexor stretch facing wall, mini squats x 10, rows, sh ext, sh ER with Red Tband  09/16/21 Patient education   PATIENT EDUCATION:  Education details: HEP Person educated: Patient Education method: ExConsulting civil engineerdeSystems developerhandout Education comprehension: verbalized understanding, return demo   HOME EXERCISE PROGRAM: [id number]:  CLINICAL IMPRESSION: Patient reports no new problems, her foot feels better, but her back still hurts. Treatment combined both strengthening for LE and hips as well as balance and sped of movement training to decrease fall risk.   OBJECTIVE IMPAIRMENTS Abnormal gait, decreased activity tolerance, decreased balance, decreased coordination, decreased mobility, difficulty walking, decreased ROM, decreased strength, impaired flexibility, improper body mechanics, postural dysfunction, and pain.   ACTIVITY LIMITATIONS carrying, lifting, bending, standing, squatting, sleeping, stairs, reach over head, and locomotion level  PARTICIPATION LIMITATIONS: meal prep, cleaning, laundry, shopping, and yard work  PERSONAL FACTORS Age, Past/current experiences, and Time since onset of injury/illness/exacerbation are also affecting patient's functional outcome.   REHAB POTENTIAL: Good  CLINICAL DECISION MAKING: Stable/uncomplicated  EVALUATION COMPLEXITY: Low   GOALS: Goals reviewed with patient? Yes  SHORT TERM GOALS: Target date: 10/14/2021  I with basic  HEP Baseline: initiated Goal status: ongoing  LONG TERM GOALS: Target date: 11/25/2021   I with final HEP Baseline:  Goal status: INITIAL  2.  Perform TUG in < 12 seconds to demonstrate improved balance Baseline: 19.28 Goal status: INITIAL  3.  Perform 5 x STS in < 12 seconds to demonstrate improved BLE strength Baseline: 22.00 Goal status: INITIAL  4.  Patient will ambulate x at least 500' using LRAD, MI, on level and unlevel surfaces, demonstrating normalized gait pattern Baseline: Small steps, shuffling, slow steps, patient reports she feels unsteady. Goal status: INITIAL  5.  Patient will report LBP of <3/10 at any point during the day or evening. Baseline: Up to 6/10, 4/10 today Goal status: INITIAL  PLAN: PT FREQUENCY: 2x/week  PT DURATION: 10 weeks  PLANNED INTERVENTIONS: Therapeutic exercises, Therapeutic activity, Neuromuscular re-education, Balance training, Gait training, Patient/Family education, Joint mobilization, Dry Needling, Electrical stimulation, Cryotherapy, Moist heat, Traction, Ionotophoresis '4mg'$ /ml Dexamethasone, and Manual therapy  PLAN FOR NEXT SESSION:  strengthening.   Marcelina Morel, DPT 11/02/2021, 1:59 PM

## 2021-11-04 ENCOUNTER — Ambulatory Visit (INDEPENDENT_AMBULATORY_CARE_PROVIDER_SITE_OTHER): Payer: Medicare Other | Admitting: Orthopaedic Surgery

## 2021-11-04 ENCOUNTER — Ambulatory Visit: Payer: Medicare Other | Admitting: Physical Therapy

## 2021-11-04 ENCOUNTER — Encounter: Payer: Self-pay | Admitting: Orthopaedic Surgery

## 2021-11-04 ENCOUNTER — Encounter: Payer: Self-pay | Admitting: Physical Therapy

## 2021-11-04 DIAGNOSIS — M545 Low back pain, unspecified: Secondary | ICD-10-CM | POA: Diagnosis not present

## 2021-11-04 DIAGNOSIS — M6281 Muscle weakness (generalized): Secondary | ICD-10-CM

## 2021-11-04 DIAGNOSIS — M170 Bilateral primary osteoarthritis of knee: Secondary | ICD-10-CM

## 2021-11-04 DIAGNOSIS — R262 Difficulty in walking, not elsewhere classified: Secondary | ICD-10-CM

## 2021-11-04 DIAGNOSIS — M25552 Pain in left hip: Secondary | ICD-10-CM

## 2021-11-04 DIAGNOSIS — G8929 Other chronic pain: Secondary | ICD-10-CM

## 2021-11-04 DIAGNOSIS — M5441 Lumbago with sciatica, right side: Secondary | ICD-10-CM | POA: Diagnosis not present

## 2021-11-04 MED ORDER — HYALURONAN 30 MG/2ML IX SOSY
30.0000 mg | PREFILLED_SYRINGE | INTRA_ARTICULAR | Status: AC | PRN
  Administered 2021-11-04: 30 mg via INTRA_ARTICULAR

## 2021-11-04 NOTE — Progress Notes (Signed)
Office Visit Note   Patient: Rebecca [name]           Date of Birth: Mar 22, 1933           MRN: [id number] Visit Date: 11/04/2021              Requested by: [name], MD [place],  [place] [id number] PCP: Marin Olp, MD   Assessment & Plan: Visit Diagnoses:  1. Bilateral primary osteoarthritis of knee     Plan: Mrs. Trosper has been approved for bilateral Orthovisc injections.  The first injections were performed today along the medial aspect of both of her knees after alcohol Betadine prep.  We will plan to return to the office weekly for the next 2 weeks to complete the series of 3  Follow-Up Instructions: Return in about 1 week (around 11/11/2021).   Orders:  No orders of the defined types were placed in this encounter.  No orders of the defined types were placed in this encounter.     Procedures: Large Joint Inj: bilateral knee on 11/04/2021 2:31 PM Indications: pain and joint swelling Details: 25 G 1.5 in needle, anteromedial approach  Arthrogram: No  Medications (Right): 30 mg Hyaluronan 30 MG/2ML Medications (Left): 30 mg Hyaluronan 30 MG/2ML Outcome: tolerated well, no immediate complications Procedure, treatment alternatives, risks and benefits explained, specific risks discussed. Consent was given by the patient. Immediately prior to procedure a time out was called to verify the correct patient, procedure, equipment, support staff and site/side marked as required. Patient was prepped and draped in the usual sterile fashion.      Clinical Data: No additional findings.   Subjective: Chief Complaint  Patient presents with   Left Knee - Follow-up   Right Knee - Follow-up   Patient presents today for bilateral Orthovisc in her left and right knee. Patient states that she feels like her right knee has become worse then her left knee. She is currently ambulating with a cane at this time to help with balance and movement.  She reports that she has not had any injuries or falls that she would like to report.  Review of Systems   Objective: Vital Signs: There were no vitals taken for this visit.  Physical Exam  Ortho Exam neither knee was hot red warm but may be very minimal effusion.  Full extension flexed over 100 degrees without instability.  Mostly medial joint pain but mild bilaterally  Specialty Comments:  No specialty comments available.  Imaging: No results found.   PMFS History: Patient Active Problem List   Diagnosis Date Noted   CLL (chronic lymphocytic leukemia) ([place]) 09/30/2021   Cholangitis 03/26/2021   Cholelithiasis 03/25/2021   Elevated LFTs 03/25/2021   Chronic hyponatremia 03/25/2021   Bilateral primary osteoarthritis of knee 06/11/2019   Pain in left shoulder 05/22/2019   Unspecified vitamin D deficiency 12/25/2013   SPINAL STENOSIS, LUMBAR 03/09/2010   Osteoporosis 03/09/2010   MIGRAINE, OPHTHALMIC 10/29/2009   ADENOMATOUS COLONIC POLYP 06/10/2009   History of iron deficiency 05/06/2009   Carotid artery disease ([place]) 07/28/2008   Allergic rhinitis 03/20/2008   DYSPHAGIA 03/12/2008   Ganglion of tendon sheath 08/17/2007   Hypothyroidism 11/15/2006   Hyperlipidemia 11/07/2006   Essential hypertension 10/18/2006   GERD 10/18/2006   DIVERTICULOSIS, COLON 10/18/2006   Past Medical History:  Diagnosis Date   Anemia    Anxiety    Bifascicular block    Diverticulitis    Diverticulosis  Gastroparesis    GERD (gastroesophageal reflux disease)    Glaucoma    Hemorrhoids    Hyperlipidemia    Hypertension    Hypertension    IBS (irritable bowel syndrome)    Rectal ulcer    SCOLIOSIS, LUMBAR SPINE 03/09/2010   Qualifier: Diagnosis of  By: Arnoldo Morale MD, John E    Segmental colitis [facility])    Tubular adenoma of colon 05/2009    Family History  Problem Relation Age of Onset   Diabetes Mother    Heart disease Mother    Hyperlipidemia Mother    Hypertension Mother     Anuerysm Father        abdominal- heavy smoker   Heart disease Father    Hypertension Father    Colon polyps Sister    Lung cancer Sister        smoker   Colon polyps Brother    Diabetes Brother    Hyperlipidemia Brother    Hypertension Brother    Heart disease Brother    Colon cancer Paternal Grandmother        with possible stomach cancer   Migraines Daughter    Kidney Stones Daughter    Colon polyps Daughter    Breast cancer Daughter    Cancer Daughter        salivary   Colon cancer Maternal Aunt        Rectal cancer   Crohn's disease Neg Hx    Pancreatic cancer Neg Hx     Past Surgical History:  Procedure Laterality Date   APPENDECTOMY     ARCUATE KERATECTOMY     BILIARY DILATION  03/27/2021   Procedure: BILIARY DILATION;  Surgeon: [name], MD;  Location: [facility];  Service: Gastroenterology;;   CATARACT EXTRACTION, BILATERAL  2015   CHOLECYSTECTOMY N/A 03/28/2021   Procedure: LAPAROSCOPIC CHOLECYSTECTOMY;  Surgeon: Leighton Ruff, MD;  Location: [facility];  Service: General;  Laterality: N/A;   double pallital tori bone removed      ERCP N/A 03/27/2021   Procedure: ENDOSCOPIC RETROGRADE CHOLANGIOPANCREATOGRAPHY (ERCP);  Surgeon: [name], MD;  Location: [facility];  Service: Gastroenterology;  Laterality: N/A;   HERNIA REPAIR     umbilical   KNEE SURGERY Bilateral    morton's neuroma on rt foot and platar facial release     MOUTH SURGERY     "bone shaved off roof of mouth"   REMOVAL OF STONES  03/27/2021   Procedure: REMOVAL OF STONES;  Surgeon: [name], MD;  Location: [facility];  Service: Gastroenterology;;   SALIVARY GLAND SURGERY     removal   SPHINCTEROTOMY  03/27/2021   Procedure: SPHINCTEROTOMY;  Surgeon: [name], MD;  Location: [facility];  Service: Gastroenterology;;   TONSILLECTOMY     Social History   Occupational History   Occupation: Glass blower/designer    Comment: retired  Tobacco Use   Smoking status:  Never   Smokeless tobacco: Never  Vaping Use   Vaping Use: Never used  Substance and Sexual Activity   Alcohol use: Yes    Alcohol/week: 0.0 standard drinks of alcohol    Comment: rare   Drug use: No   Sexual activity: Not Currently

## 2021-11-04 NOTE — Therapy (Signed)
OUTPATIENT PHYSICAL THERAPY LOWER EXTREMITY TREATMENT   Patient Name: ARAIYA TILMON MRN: [id number] DOB:22-Dec-1932, 88 y.o., female Today's Date: 11/04/2021   PT End of Session - 11/04/21 1154     Visit Number 8    Date for PT Re-Evaluation 11/25/21    PT Start Time 1149    PT Stop Time 1215    PT Time Calculation (min) 26 min    Activity Tolerance Patient tolerated treatment well    Behavior During Therapy [facility] for tasks assessed/performed                    Past Medical History:  Diagnosis Date   Anemia    Anxiety    Bifascicular block    Diverticulitis    Diverticulosis    Gastroparesis    GERD (gastroesophageal reflux disease)    Glaucoma    Hemorrhoids    Hyperlipidemia    Hypertension    Hypertension    IBS (irritable bowel syndrome)    Rectal ulcer    SCOLIOSIS, LUMBAR SPINE 03/09/2010   Qualifier: Diagnosis of  By: Arnoldo Morale MD, John E    Segmental colitis [facility])    Tubular adenoma of colon 05/2009   Past Surgical History:  Procedure Laterality Date   APPENDECTOMY     ARCUATE KERATECTOMY     BILIARY DILATION  03/27/2021   Procedure: BILIARY DILATION;  Surgeon: [name], MD;  Location: [facility];  Service: Gastroenterology;;   CATARACT EXTRACTION, BILATERAL  2015   CHOLECYSTECTOMY N/A 03/28/2021   Procedure: LAPAROSCOPIC CHOLECYSTECTOMY;  Surgeon: Leighton Ruff, MD;  Location: [facility];  Service: General;  Laterality: N/A;   double pallital tori bone removed      ERCP N/A 03/27/2021   Procedure: ENDOSCOPIC RETROGRADE CHOLANGIOPANCREATOGRAPHY (ERCP);  Surgeon: [name], MD;  Location: [facility];  Service: Gastroenterology;  Laterality: N/A;   HERNIA REPAIR     umbilical   KNEE SURGERY Bilateral    morton's neuroma on rt foot and platar facial release     MOUTH SURGERY     "bone shaved off roof of mouth"   REMOVAL OF STONES  03/27/2021   Procedure: REMOVAL OF STONES;  Surgeon: [name], MD;  Location: [facility];   Service: Gastroenterology;;   SALIVARY GLAND SURGERY     removal   SPHINCTEROTOMY  03/27/2021   Procedure: SPHINCTEROTOMY;  Surgeon: [name], MD;  Location: [facility];  Service: Gastroenterology;;   TONSILLECTOMY     Patient Active Problem List   Diagnosis Date Noted   CLL (chronic lymphocytic leukemia) ([place]) 09/30/2021   Cholangitis 03/26/2021   Cholelithiasis 03/25/2021   Elevated LFTs 03/25/2021   Chronic hyponatremia 03/25/2021   Bilateral primary osteoarthritis of knee 06/11/2019   Pain in left shoulder 05/22/2019   Unspecified vitamin D deficiency 12/25/2013   SPINAL STENOSIS, LUMBAR 03/09/2010   Osteoporosis 03/09/2010   MIGRAINE, OPHTHALMIC 10/29/2009   ADENOMATOUS COLONIC POLYP 06/10/2009   History of iron deficiency 05/06/2009   Carotid artery disease ([place]) 07/28/2008   Allergic rhinitis 03/20/2008   DYSPHAGIA 03/12/2008   Ganglion of tendon sheath 08/17/2007   Hypothyroidism 11/15/2006   Hyperlipidemia 11/07/2006   Essential hypertension 10/18/2006   GERD 10/18/2006   DIVERTICULOSIS, COLON 10/18/2006    PCP: [name], MD   REFERRING PROVIDER: [name], MD   REFERRING DIAG: R29.898 (ICD-10-CM) - Weakness of both lower extremities   THERAPY DIAG:  Chronic bilateral low back pain without sciatica  Difficulty in walking, not elsewhere classified  Muscle weakness (generalized)  Pain in left hip  Chronic bilateral low back pain with right-sided sciatica  Rationale for Evaluation and Treatment Rehabilitation  ONSET DATE: 09/09/2021   SUBJECTIVE:   SUBJECTIVE STATEMENT: Patient reports No changes. She needs to leave early due to having a Dr appointment across town to receive injections in her knees.   PERTINENT HISTORY: L hip fracture. L4, L5 with nerve impingement on the R, per patient. 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 No active treatment.  PAIN:  Are  you having pain? Yes: NPRS scale: 4/10 Pain location: R hip and foot Pain description: pain in hip, numbness in foot Aggravating factors: activity, bending Relieving factors: heating pad, avoid ben=ding  PRECAUTIONS: None  WEIGHT BEARING RESTRICTIONS No  FALLS:  Has patient fallen in last 6 months? No  LIVING ENVIRONMENT: Lives with: lives with their family and lives alone Lives in: House/apartment Stairs: Yes: External: 1 steps; none Has following equipment at home: Single point cane  OCCUPATION: retired  PLOF: Independent  PATIENT GOALS Walk straight, preferably without a cane   OBJECTIVE:   DIAGNOSTIC FINDINGS: N/A  COGNITION:  Overall cognitive status: Within functional limits for tasks assessed     SENSATION: Not tested  EDEMA: None   MUSCLE LENGTH: Hamstrings: Right Approx 75 degrees Thomas test: Slightly beyond neutral B.  POSTURE: forward head, decreased lumbar lordosis, decreased thoracic kyphosis, flexed trunk , and Scoliosis with rib hump on L, R paraspinal hump at lumbar region, R shoulder significantly lower than L  PALPATION: Tightness noted throughout paraspinals. TTP medial L scapular border, L gluts.  LOWER EXTREMITY ROM: BLE ROM generally WFL, but R hip mildly diminished and painful.      LOWER EXTREMITY MMT:  MMT Right eval Left eval  Hip flexion 3+ 4  Hip extension 3 4-  Hip abduction 3 4-  Hip adduction    Hip internal rotation    Hip external rotation    Knee flexion 3+ 4  Knee extension 4- 4  Ankle dorsiflexion 4 4  Ankle plantarflexion    Ankle inversion    Ankle eversion     (Blank rows = not tested)    FUNCTIONAL TESTS:  5 times sit to stand: 22.00 Timed up and go (TUG): 19.28  GAIT: Distance walked: 12' Assistive device utilized: Single point cane Level of assistance: Modified independence Comments: Small steps, shuffling, slow steps, patient reports she feels unsteady.   TODAY'S TREATMENT: 11/04/21 NuStep  L5 x 6 minutes Supine DKTC on physioball for stretch 10 x 3 sec hold Lower trunk rotation on physioball 5x to each side Piriformis stretch over B B x 30 sec B bridge over ball x 10 reps B bridge over ball, rotating hips slightly to each side x 5 reps. Alternate SLR off ball while stabilizing with the opposite leg x 10 reps. Sit to stand x 5 reps.  11/01/21 Recumbent cycling L1 x 4 minutes Sit to stand from elevated mat while holding 1# ball in BUE 1 x 10 Side to side step with red Tband resistance at knees, x 10 reps, HHA. Sid e to side quick steps Forward and back quick steps Quick steps changing directions upon command of therapist- All performed with B HHA. Step ups on 4" step with B UE support on rails 10 reps each leg Lateral step ups on 4" step, 10 each leg.  10/28/21 NuStep L3 x  4 minutes, then 2 x 20 seconds at L5 Sit on physiodisc- ant/post, lat lower body weight shifts with improved control noted. Static sitting on disc while reaching up and then into diagonals with BUE holding 2# ball 10 reps each. Side to side stepping with red T band resistance around knees, 10 reps to each side, B HHA 4 square stepping 5 times in each direction iwht straight cane and CGA, mild unsteadiness. Walked 100' with straight cane and MI demosntrating improved upright posture after treatment.   10/07/21 Sit <> stand x 10 reps, no hands,. Seated trunk mobilization- Large physioball at her back, practiced ant/post lower body weight shifts with ball and therapist providing tactile cues. Seated rotational weight shifts with hands placed on mat to her R. Weight shifts forward and back into diagonal x 6 reps with therapist facilitating lower trunk weight shifts in diagonal. Returned to seated lower trunk mobilizations with improved excursion, placed hands behind hips and added upper body ext 6 x 5 sec Supine chin tucks to elongate posterior neck, 8 x 10 sec. Step ups on 4" step with BUE support x 10, then  side step ups with UUE support x 10 each.    10/06/21 Nu-Step L3 x 6 mins Sitting LE stretching- HS, figure 4, ITB. Patient had great difficulty achieving a stretch. Facilitated lower trunk mobilizations-ant/post, and lateral. Again, great diffiuclty, required max TC and VC on physiodisc. Ambulated in assessment with cane in R hand, L hand and holding cane up with BUE. She maintains L lateral trunk flexion, and forward flex, slightly improved while holding the cane. Her trunk is very immobile in any direction.  09/29/21 Nustep L3 x17mns Supine K2C 30s HS stretch 30s  Piriformis stretch 30s  Feet on ball trunk rotations 132m SLR x10, 2#x10 LAQ 2# 2x10 2 way hip 2# 2x10    STS 2x10 Step ups 4" w/1HHA   09/22/21 Nu-Step L3 x 6 minutes Supine stretches SKTC, Active HS, figure 4, ITB, 30 sec each side. Supine strength- bridging, SLR, SLR with ER, 10 each Standing hip flexor stretch facing wall, mini squats x 10, rows, sh ext, sh ER with Red Tband  09/16/21 Patient education   PATIENT EDUCATION:  Education details: HEP Person educated: Patient Education method: ExConsulting civil engineerdeSystems developerhandout Education comprehension: verbalized understanding, return demo   HOME EXERCISE PROGRAM: [id number]:  CLINICAL IMPRESSION: Patient reports no new problems. Short session today as she has a Dr appointment for B knee injections. She tolerated combination of stretch and stabilization exercises, although B knees limited her sit to stand due to pain.   OBJECTIVE IMPAIRMENTS Abnormal gait, decreased activity tolerance, decreased balance, decreased coordination, decreased mobility, difficulty walking, decreased ROM, decreased strength, impaired flexibility, improper body mechanics, postural dysfunction, and pain.   ACTIVITY LIMITATIONS carrying, lifting, bending, standing, squatting, sleeping, stairs, reach over head, and locomotion level  PARTICIPATION LIMITATIONS: meal prep, cleaning,  laundry, shopping, and yard work  PERSONAL FACTORS Age, Past/current experiences, and Time since onset of injury/illness/exacerbation are also affecting patient's functional outcome.   REHAB POTENTIAL: Good  CLINICAL DECISION MAKING: Stable/uncomplicated  EVALUATION COMPLEXITY: Low   GOALS: Goals reviewed with patient? Yes  SHORT TERM GOALS: Target date: 10/14/2021  I with basic HEP Baseline: initiated Goal status: ongoing  LONG TERM GOALS: Target date: 11/25/2021   I with final HEP Baseline:  Goal status: INITIAL  2.  Perform TUG in < 12 seconds to demonstrate improved balance Baseline: 19.28 Goal status: INITIAL  3.  Perform 5 x STS in < 12 seconds to demonstrate improved BLE strength Baseline: 22.00 Goal status: INITIAL  4.  Patient will ambulate x at least 500' using LRAD, MI, on level and unlevel surfaces, demonstrating normalized gait pattern Baseline: Small steps, shuffling, slow steps, patient reports she feels unsteady. Goal status: INITIAL  5.  Patient will report LBP of <3/10 at any point during the day or evening. Baseline: Up to 6/10, 4/10 today Goal status: INITIAL    PLAN: PT FREQUENCY: 2x/week  PT DURATION: 10 weeks  PLANNED INTERVENTIONS: Therapeutic exercises, Therapeutic activity, Neuromuscular re-education, Balance training, Gait training, Patient/Family education, Joint mobilization, Dry Needling, Electrical stimulation, Cryotherapy, Moist heat, Traction, Ionotophoresis '4mg'$ /ml Dexamethasone, and Manual therapy  PLAN FOR NEXT SESSION:  strengthening.   Marcelina Morel, DPT 11/04/2021, 12:16 PM

## 2021-11-06 ENCOUNTER — Other Ambulatory Visit: Payer: Self-pay | Admitting: Family Medicine

## 2021-11-06 DIAGNOSIS — E785 Hyperlipidemia, unspecified: Secondary | ICD-10-CM

## 2021-11-08 ENCOUNTER — Ambulatory Visit: Payer: Medicare Other | Admitting: Physical Therapy

## 2021-11-09 ENCOUNTER — Other Ambulatory Visit: Payer: Self-pay | Admitting: Family Medicine

## 2021-11-09 DIAGNOSIS — E785 Hyperlipidemia, unspecified: Secondary | ICD-10-CM

## 2021-11-09 MED ORDER — LEVOTHYROXINE SODIUM 88 MCG PO TABS: 88.0000 ug | ORAL_TABLET | Freq: Every day | ORAL | 3 refills | Status: DC

## 2021-11-09 MED ORDER — ALPRAZOLAM 0.25 MG PO TABS: ORAL_TABLET | ORAL | 0 refills | Status: DC

## 2021-11-09 NOTE — Telephone Encounter (Signed)
  LAST APPOINTMENT DATE:  Please schedule appointment if longer than 1 year  09/30/21  NEXT APPOINTMENT DATE: 03/25/22  MEDICATION:levothyroxine (SYNTHROID) 88 MCG tablet  Patient requests 90 day supply  AND  ALPRAZolam (XANAX) 0.25 MG tablet   Is the patient out of medication? No-approx 4 tablets left Levothyroxine  PHARMACY: [facility] [place], [address] AT [address] Phone:  [phone]  Fax:  [phone]     Patient requests to be called at ph# [phone] if any questions or concerns

## 2021-11-11 ENCOUNTER — Ambulatory Visit: Payer: Medicare Other | Admitting: Physical Therapy

## 2021-11-11 ENCOUNTER — Encounter: Payer: Self-pay | Admitting: Physical Therapy

## 2021-11-11 DIAGNOSIS — G8929 Other chronic pain: Secondary | ICD-10-CM

## 2021-11-11 DIAGNOSIS — M6281 Muscle weakness (generalized): Secondary | ICD-10-CM | POA: Diagnosis not present

## 2021-11-11 DIAGNOSIS — M25552 Pain in left hip: Secondary | ICD-10-CM | POA: Diagnosis not present

## 2021-11-11 DIAGNOSIS — R262 Difficulty in walking, not elsewhere classified: Secondary | ICD-10-CM | POA: Diagnosis not present

## 2021-11-11 DIAGNOSIS — M545 Low back pain, unspecified: Secondary | ICD-10-CM | POA: Diagnosis not present

## 2021-11-11 DIAGNOSIS — M5441 Lumbago with sciatica, right side: Secondary | ICD-10-CM | POA: Diagnosis not present

## 2021-11-11 NOTE — Therapy (Signed)
OUTPATIENT PHYSICAL THERAPY LOWER EXTREMITY TREATMENT   Patient Name: Rebecca [name] MRN: [id number] DOB:28-Jun-1932, 88 y.o., female Today's Date: 11/11/2021   PT End of Session - 11/11/21 1324     Visit Number 9    Date for PT Re-Evaluation 11/25/21    PT Start Time 1320    PT Stop Time 3149    PT Time Calculation (min) 38 min    Activity Tolerance Patient tolerated treatment well    Behavior During Therapy WFL for tasks assessed/performed                     Past Medical History:  Diagnosis Date   Anemia    Anxiety    Bifascicular block    Diverticulitis    Diverticulosis    Gastroparesis    GERD (gastroesophageal reflux disease)    Glaucoma    Hemorrhoids    Hyperlipidemia    Hypertension    Hypertension    IBS (irritable bowel syndrome)    Rectal ulcer    SCOLIOSIS, LUMBAR SPINE 03/09/2010   Qualifier: Diagnosis of  By: Arnoldo Morale MD, John E    Segmental colitis [facility])    Tubular adenoma of colon 05/2009   Past Surgical History:  Procedure Laterality Date   APPENDECTOMY     ARCUATE KERATECTOMY     BILIARY DILATION  03/27/2021   Procedure: BILIARY DILATION;  Surgeon: [name], MD;  Location: [facility];  Service: Gastroenterology;;   CATARACT EXTRACTION, BILATERAL  2015   CHOLECYSTECTOMY N/A 03/28/2021   Procedure: LAPAROSCOPIC CHOLECYSTECTOMY;  Surgeon: Leighton Ruff, MD;  Location: [facility];  Service: General;  Laterality: N/A;   double pallital tori bone removed      ERCP N/A 03/27/2021   Procedure: ENDOSCOPIC RETROGRADE CHOLANGIOPANCREATOGRAPHY (ERCP);  Surgeon: [name], MD;  Location: [facility];  Service: Gastroenterology;  Laterality: N/A;   HERNIA REPAIR     umbilical   KNEE SURGERY Bilateral    morton's neuroma on rt foot and platar facial release     MOUTH SURGERY     "bone shaved off roof of mouth"   REMOVAL OF STONES  03/27/2021   Procedure: REMOVAL OF STONES;  Surgeon: [name], MD;  Location: [facility];  Service: Gastroenterology;;   SALIVARY GLAND SURGERY     removal   SPHINCTEROTOMY  03/27/2021   Procedure: SPHINCTEROTOMY;  Surgeon: [name], MD;  Location: [facility];  Service: Gastroenterology;;   TONSILLECTOMY     Patient Active Problem List   Diagnosis Date Noted   CLL (chronic lymphocytic leukemia) ([place]) 09/30/2021   Cholangitis 03/26/2021   Cholelithiasis 03/25/2021   Elevated LFTs 03/25/2021   Chronic hyponatremia 03/25/2021   Bilateral primary osteoarthritis of knee 06/11/2019   Pain in left shoulder 05/22/2019   Unspecified vitamin D deficiency 12/25/2013   SPINAL STENOSIS, LUMBAR 03/09/2010   Osteoporosis 03/09/2010   MIGRAINE, OPHTHALMIC 10/29/2009   ADENOMATOUS COLONIC POLYP 06/10/2009   History of iron deficiency 05/06/2009   Carotid artery disease ([place]) 07/28/2008   Allergic rhinitis 03/20/2008   DYSPHAGIA 03/12/2008   Ganglion of tendon sheath 08/17/2007   Hypothyroidism 11/15/2006   Hyperlipidemia 11/07/2006   Essential hypertension 10/18/2006   GERD 10/18/2006   DIVERTICULOSIS, COLON 10/18/2006    PCP: [name], MD   REFERRING PROVIDER: [name], MD   REFERRING DIAG: R29.898 (ICD-10-CM) - Weakness of both lower extremities   THERAPY DIAG:  Chronic bilateral low back pain without  sciatica  Difficulty in walking, not elsewhere classified  Muscle weakness (generalized)  Chronic bilateral low back pain with right-sided sciatica  Pain in left hip  Rationale for Evaluation and Treatment Rehabilitation  ONSET DATE: 09/09/2021   SUBJECTIVE:   SUBJECTIVE STATEMENT: Patient reports that her knees feel better. Her R knee is still somewhat sore, but not as bad as prior to her injections. She is scheduled for one more set next week.   PERTINENT HISTORY: L hip fracture. L4, L5 with nerve impingement on the R, per patient. 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 No active treatment.  PAIN:  Are you having pain? Yes: NPRS scale: 4/10 Pain location: R hip and foot Pain description: pain in hip, numbness in foot Aggravating factors: activity, bending Relieving factors: heating pad, avoid ben=ding  PRECAUTIONS: None  WEIGHT BEARING RESTRICTIONS No  FALLS:  Has patient fallen in last 6 months? No  LIVING ENVIRONMENT: Lives with: lives with their family and lives alone Lives in: House/apartment Stairs: Yes: External: 1 steps; none Has following equipment at home: Single point cane  OCCUPATION: retired  PLOF: Independent  PATIENT GOALS Walk straight, preferably without a cane   OBJECTIVE:   DIAGNOSTIC FINDINGS: N/A  COGNITION:  Overall cognitive status: Within functional limits for tasks assessed     SENSATION: Not tested  EDEMA: None   MUSCLE LENGTH: Hamstrings: Right Approx 75 degrees Thomas test: Slightly beyond neutral B.  POSTURE: forward head, decreased lumbar lordosis, decreased thoracic kyphosis, flexed trunk , and Scoliosis with rib hump on L, R paraspinal hump at lumbar region, R shoulder significantly lower than L  PALPATION: Tightness noted throughout paraspinals. TTP medial L scapular border, L gluts.  LOWER EXTREMITY ROM: BLE ROM generally WFL, but R hip mildly diminished and painful.      LOWER EXTREMITY MMT:  MMT Right eval Left eval  Hip flexion 3+ 4  Hip extension 3 4-  Hip abduction 3 4-  Hip adduction    Hip internal rotation    Hip external rotation    Knee flexion 3+ 4  Knee extension 4- 4  Ankle dorsiflexion 4 4  Ankle plantarflexion    Ankle inversion    Ankle eversion     (Blank rows = not tested)    FUNCTIONAL TESTS:  5 times sit to stand: 22.00 Timed up and go (TUG): 19.28  GAIT: Distance walked: 34' Assistive device utilized: Single point cane Level of assistance: Modified independence Comments: Small steps, shuffling, slow steps, patient reports she feels  unsteady.   TODAY'S TREATMENT: 11/11/21 Recumbent bike L3 x 6 minutes. Step ups onto 4" step with BUE support 10 reps each Side step ups onto 4" step, UUE support Crossover step ups onto 4" step, 10 reps each, UUE support Quick steps in all directions, changing upon therapist command 2 minutes, B HHA 4 square stepping for speed in both directions-30 sec each SLS- alternately tapping each foot on cones in semicircle in front. She had much more difficulty standing on LLE due to hip weakness.  11/04/21 NuStep L5 x 6 minutes Supine DKTC on physioball for stretch 10 x 3 sec hold Lower trunk rotation on physioball 5x to each side Piriformis stretch over B B x 30 sec B bridge over ball x 10 reps B bridge over ball, rotating hips slightly to each side x 5 reps. Alternate SLR off ball while stabilizing with the opposite leg x 10 reps. Sit  to stand x 5 reps.  11/01/21 Recumbent cycling L1 x 4 minutes Sit to stand from elevated mat while holding 1# ball in BUE 1 x 10 Side to side step with red Tband resistance at knees, x 10 reps, HHA. Sid e to side quick steps Forward and back quick steps Quick steps changing directions upon command of therapist- All performed with B HHA. Step ups on 4" step with B UE support on rails 10 reps each leg Lateral step ups on 4" step, 10 each leg.  10/28/21 NuStep L3 x 4 minutes, then 2 x 20 seconds at L5 Sit on physiodisc- ant/post, lat lower body weight shifts with improved control noted. Static sitting on disc while reaching up and then into diagonals with BUE holding 2# ball 10 reps each. Side to side stepping with red T band resistance around knees, 10 reps to each side, B HHA 4 square stepping 5 times in each direction iwht straight cane and CGA, mild unsteadiness. Walked 100' with straight cane and MI demosntrating improved upright posture after treatment.   10/07/21 Sit <> stand x 10 reps, no hands,. Seated trunk mobilization- Large physioball at her  back, practiced ant/post lower body weight shifts with ball and therapist providing tactile cues. Seated rotational weight shifts with hands placed on mat to her R. Weight shifts forward and back into diagonal x 6 reps with therapist facilitating lower trunk weight shifts in diagonal. Returned to seated lower trunk mobilizations with improved excursion, placed hands behind hips and added upper body ext 6 x 5 sec Supine chin tucks to elongate posterior neck, 8 x 10 sec. Step ups on 4" step with BUE support x 10, then side step ups with UUE support x 10 each.    10/06/21 Nu-Step L3 x 6 mins Sitting LE stretching- HS, figure 4, ITB. Patient had great difficulty achieving a stretch. Facilitated lower trunk mobilizations-ant/post, and lateral. Again, great diffiuclty, required max TC and VC on physiodisc. Ambulated in assessment with cane in R hand, L hand and holding cane up with BUE. She maintains L lateral trunk flexion, and forward flex, slightly improved while holding the cane. Her trunk is very immobile in any direction.  09/29/21 Nustep L3 x37mns Supine K2C 30s HS stretch 30s  Piriformis stretch 30s  Feet on ball trunk rotations 145m SLR x10, 2#x10 LAQ 2# 2x10 2 way hip 2# 2x10    STS 2x10 Step ups 4" w/1HHA   09/22/21 Nu-Step L3 x 6 minutes Supine stretches SKTC, Active HS, figure 4, ITB, 30 sec each side. Supine strength- bridging, SLR, SLR with ER, 10 each Standing hip flexor stretch facing wall, mini squats x 10, rows, sh ext, sh ER with Red Tband  09/16/21 Patient education   PATIENT EDUCATION:  Education details: HEP Person educated: Patient Education method: ExConsulting civil engineerdeSystems developerhandout Education comprehension: verbalized understanding, return demo   HOME EXERCISE PROGRAM: [id number]:  CLINICAL IMPRESSION: Patient reports improved pain in knees after her second round of injections. Has one more set scheduled for next week. She tolerated increased challenge  and strengthening today. Also focused on speed of movement and balance training.   OBJECTIVE IMPAIRMENTS Abnormal gait, decreased activity tolerance, decreased balance, decreased coordination, decreased mobility, difficulty walking, decreased ROM, decreased strength, impaired flexibility, improper body mechanics, postural dysfunction, and pain.   ACTIVITY LIMITATIONS carrying, lifting, bending, standing, squatting, sleeping, stairs, reach over head, and locomotion level  PARTICIPATION LIMITATIONS: meal prep, cleaning, laundry, shopping, and yard work  PERSONAL  FACTORS Age, Past/current experiences, and Time since onset of injury/illness/exacerbation are also affecting patient's functional outcome.   REHAB POTENTIAL: Good  CLINICAL DECISION MAKING: Stable/uncomplicated  EVALUATION COMPLEXITY: Low   GOALS: Goals reviewed with patient? Yes  SHORT TERM GOALS: Target date: 10/14/2021  I with basic HEP Baseline: initiated Goal status: ongoing  LONG TERM GOALS: Target date: 11/25/2021   I with final HEP Baseline:  Goal status: INITIAL  2.  Perform TUG in < 12 seconds to demonstrate improved balance Baseline: 19.28 Goal status: INITIAL  3.  Perform 5 x STS in < 12 seconds to demonstrate improved BLE strength Baseline: 22.00 Goal status: INITIAL  4.  Patient will ambulate x at least 500' using LRAD, MI, on level and unlevel surfaces, demonstrating normalized gait pattern Baseline: Small steps, shuffling, slow steps, patient reports she feels unsteady. Goal status: INITIAL  5.  Patient will report LBP of <3/10 at any point during the day or evening. Baseline: Up to 6/10, 4/10 today Goal status: INITIAL    PLAN: PT FREQUENCY: 2x/week  PT DURATION: 10 weeks  PLANNED INTERVENTIONS: Therapeutic exercises, Therapeutic activity, Neuromuscular re-education, Balance training, Gait training, Patient/Family education, Joint mobilization, Dry Needling, Electrical stimulation,  Cryotherapy, Moist heat, Traction, Ionotophoresis 54m/ml Dexamethasone, and Manual therapy  PLAN FOR NEXT SESSION:  strengthening.   SMarcelina Morel DPT 11/11/2021, 1:58 PM

## 2021-11-12 ENCOUNTER — Ambulatory Visit (INDEPENDENT_AMBULATORY_CARE_PROVIDER_SITE_OTHER): Payer: Medicare Other | Admitting: Physician Assistant

## 2021-11-12 ENCOUNTER — Encounter: Payer: Self-pay | Admitting: Physician Assistant

## 2021-11-12 DIAGNOSIS — M170 Bilateral primary osteoarthritis of knee: Secondary | ICD-10-CM

## 2021-11-12 MED ORDER — HYALURONAN 30 MG/2ML IX SOSY
30.0000 mg | PREFILLED_SYRINGE | INTRA_ARTICULAR | Status: AC | PRN
  Administered 2021-11-12: 30 mg via INTRA_ARTICULAR

## 2021-11-12 NOTE — Progress Notes (Signed)
Office Visit Note   Patient: Rebecca [name]           Date of Birth: 09/27/1932           MRN: [id number] Visit Date: 11/12/2021              Requested by: Marin Olp, MD [place],  [place] [address] PCP: Marin Olp, MD  Chief Complaint  Patient presents with  . Left Knee - Follow-up  . Right Knee - Follow-up      HPI: Patient is a pleasant 88 year old woman who comes in for her second Orthovisc injection into both of her knees she does think she is gotten some relief.  Had no problem with the first injections  Assessment & Plan: Visit Diagnoses: Osteoarthritis bilateral knees  Plan: Patient tolerated the injections well will follow-up for final injections in 1 week  Follow-Up Instructions: No follow-ups on file.   Ortho Exam  Patient is alert, oriented, no adenopathy, well-dressed, normal affect, normal respiratory effort. Bilateral knees no redness no erythema no swelling.  Imaging: No results found. No images are attached to the encounter.  Labs: Lab Results  Component Value Date   HGBA1C 5.6 07/26/2006   ESRSEDRATE 22 02/10/2021   REPTSTATUS 03/04/2018 FINAL 03/03/2018   CULT  03/03/2018    NO GROWTH Performed at [facility], [employer] [address], [place], [place] [address]    LABORGA NO GROWTH 06/10/2016     Lab Results  Component Value Date   ALBUMIN 4.2 08/13/2021   ALBUMIN 4.2 05/05/2021   ALBUMIN 4.0 04/09/2021    Lab Results  Component Value Date   MG 1.8 03/28/2021   Lab Results  Component Value Date   VD25OH 45 03/20/2020   VD25OH 47.49 01/01/2016   VD25OH 41.39 12/25/2013    No results found for: "PREALBUMIN"    Latest Ref Rng & Units 08/13/2021   10:36 AM 05/05/2021   12:38 PM 04/09/2021   11:56 AM  CBC EXTENDED  WBC 4.0 - 10.5 K/uL 19.7  22.1  19.2 Repeated and verified X2.   RBC 3.87 - 5.11 MIL/uL 3.65  3.56  3.35   Hemoglobin 12.0 - 15.0 g/dL 12.3  11.9  11.1   HCT 36.0 - 46.0 % 36.2   35.8  33.4   Platelets 150 - 400 K/uL 155  141  219.0   NEUT# 1.7 - 7.7 K/uL 3.0  3.4  3.3   Lymph# 0.7 - 4.0 K/uL 14.3  15.8  15.2      There is no height or weight on file to calculate BMI.  Orders:  No orders of the defined types were placed in this encounter.  No orders of the defined types were placed in this encounter.    Procedures: Large Joint Inj: bilateral knee on 11/12/2021 1:19 PM Indications: pain and diagnostic evaluation Details: 25 G 1.5 in needle, anteromedial approach  Arthrogram: No  Medications (Right): 30 mg Hyaluronan 30 MG/2ML Medications (Left): 30 mg Hyaluronan 30 MG/2ML Outcome: tolerated well, no immediate complications Procedure, treatment alternatives, risks and benefits explained, specific risks discussed. Consent was given by the patient.    Clinical Data: No additional findings.  ROS:  All other systems negative, except as noted in the HPI. Review of Systems  Objective: Vital Signs: There were no vitals taken for this visit.  Specialty Comments:  No specialty comments available.  PMFS History: Patient Active Problem List   Diagnosis  Date Noted  . CLL (chronic lymphocytic leukemia) ([place]) 09/30/2021  . Cholangitis 03/26/2021  . Cholelithiasis 03/25/2021  . Elevated LFTs 03/25/2021  . Chronic hyponatremia 03/25/2021  . Bilateral primary osteoarthritis of knee 06/11/2019  . Pain in left shoulder 05/22/2019  . Unspecified vitamin D deficiency 12/25/2013  . SPINAL STENOSIS, LUMBAR 03/09/2010  . Osteoporosis 03/09/2010  . MIGRAINE, OPHTHALMIC 10/29/2009  . ADENOMATOUS COLONIC POLYP 06/10/2009  . History of iron deficiency 05/06/2009  . Carotid artery disease ([place]) 07/28/2008  . Allergic rhinitis 03/20/2008  . DYSPHAGIA 03/12/2008  . Ganglion of tendon sheath 08/17/2007  . Hypothyroidism 11/15/2006  . Hyperlipidemia 11/07/2006  . Essential hypertension 10/18/2006  . GERD 10/18/2006  . DIVERTICULOSIS, COLON 10/18/2006   Past  Medical History:  Diagnosis Date  . Anemia   . Anxiety   . Bifascicular block   . Diverticulitis   . Diverticulosis   . Gastroparesis   . GERD (gastroesophageal reflux disease)   . Glaucoma   . Hemorrhoids   . Hyperlipidemia   . Hypertension   . Hypertension   . IBS (irritable bowel syndrome)   . Rectal ulcer   . SCOLIOSIS, LUMBAR SPINE 03/09/2010   Qualifier: Diagnosis of  By: Arnoldo Morale MD, Balinda Quails   . Segmental colitis ([place])   . Tubular adenoma of colon 05/2009    Family History  Problem Relation Age of Onset  . Diabetes Mother   . Heart disease Mother   . Hyperlipidemia Mother   . Hypertension Mother   . Anuerysm Father        abdominal- heavy smoker  . Heart disease Father   . Hypertension Father   . Colon polyps Sister   . Lung cancer Sister        smoker  . Colon polyps Brother   . Diabetes Brother   . Hyperlipidemia Brother   . Hypertension Brother   . Heart disease Brother   . Colon cancer Paternal Grandmother        with possible stomach cancer  . Migraines Daughter   . Kidney Stones Daughter   . Colon polyps Daughter   . Breast cancer Daughter   . Cancer Daughter        salivary  . Colon cancer Maternal Aunt        Rectal cancer  . Crohn's disease Neg Hx   . Pancreatic cancer Neg Hx     Past Surgical History:  Procedure Laterality Date  . APPENDECTOMY    . ARCUATE KERATECTOMY    . BILIARY DILATION  03/27/2021   Procedure: BILIARY DILATION;  Surgeon: [name], MD;  Location: [facility];  Service: Gastroenterology;;  . CATARACT EXTRACTION, BILATERAL  2015  . CHOLECYSTECTOMY N/A 03/28/2021   Procedure: LAPAROSCOPIC CHOLECYSTECTOMY;  Surgeon: Leighton Ruff, MD;  Location: [facility];  Service: General;  Laterality: N/A;  . double pallital tori bone removed     . ERCP N/A 03/27/2021   Procedure: ENDOSCOPIC RETROGRADE CHOLANGIOPANCREATOGRAPHY (ERCP);  Surgeon: [name], MD;  Location: [facility];  Service: Gastroenterology;  Laterality:  N/A;  . HERNIA REPAIR     umbilical  . KNEE SURGERY Bilateral   . morton's neuroma on rt foot and platar facial release    . MOUTH SURGERY     "bone shaved off roof of mouth"  . REMOVAL OF STONES  03/27/2021   Procedure: REMOVAL OF STONES;  Surgeon: [name], MD;  Location: [facility];  Service: Gastroenterology;;  . SALIVARY GLAND SURGERY  removal  . SPHINCTEROTOMY  03/27/2021   Procedure: SPHINCTEROTOMY;  Surgeon: [name], MD;  Location: [facility];  Service: Gastroenterology;;  . TONSILLECTOMY     Social History   Occupational History  . Occupation: Glass blower/designer    Comment: retired  Tobacco Use  . Smoking status: Never  . Smokeless tobacco: Never  Vaping Use  . Vaping Use: Never used  Substance and Sexual Activity  . Alcohol use: Yes    Alcohol/week: 0.0 standard drinks of alcohol    Comment: rare  . Drug use: No  . Sexual activity: Not Currently

## 2021-11-16 ENCOUNTER — Ambulatory Visit: Payer: Medicare Other | Admitting: Physical Therapy

## 2021-11-16 ENCOUNTER — Other Ambulatory Visit: Payer: Self-pay

## 2021-11-16 ENCOUNTER — Encounter: Payer: Self-pay | Admitting: Physical Therapy

## 2021-11-16 DIAGNOSIS — M25552 Pain in left hip: Secondary | ICD-10-CM | POA: Diagnosis not present

## 2021-11-16 DIAGNOSIS — M545 Low back pain, unspecified: Secondary | ICD-10-CM

## 2021-11-16 DIAGNOSIS — G8929 Other chronic pain: Secondary | ICD-10-CM

## 2021-11-16 DIAGNOSIS — R262 Difficulty in walking, not elsewhere classified: Secondary | ICD-10-CM

## 2021-11-16 DIAGNOSIS — M6281 Muscle weakness (generalized): Secondary | ICD-10-CM | POA: Diagnosis not present

## 2021-11-16 DIAGNOSIS — M5441 Lumbago with sciatica, right side: Secondary | ICD-10-CM | POA: Diagnosis not present

## 2021-11-16 NOTE — Patient Outreach (Signed)
  Care Coordination   11/16/2021 Name: Rebecca [name] MRN: [id number] DOB: 1932-10-02   Care Coordination Outreach Attempts:  An unsuccessful telephone outreach was attempted today to offer the patient information about available care coordination services as a benefit of their health plan.   Follow Up Plan:  Additional outreach attempts will be made to offer the patient care coordination information and services.   Encounter Outcome:  No Answer  Care Coordination Interventions Activated:  No   Care Coordination Interventions:  No, not indicated    Peter Garter RN, BSN,CCM, [facility] [phone]

## 2021-11-16 NOTE — Therapy (Signed)
OUTPATIENT PHYSICAL THERAPY LOWER EXTREMITY TREATMENT  Progress Note Reporting Period 09/16/21 to 11/16/21  See note below for Objective Data and Assessment of Progress/Goals.      Patient Name: Rebecca [name] MRN: [id number] DOB:30-Aug-1932, 88 y.o., female Today's Date: 11/16/2021   PT End of Session - 11/16/21 1328     Visit Number 10    Date for PT Re-Evaluation 11/25/21    PT Start Time 1318    PT Stop Time 8588    PT Time Calculation (min) 40 min    Activity Tolerance Patient tolerated treatment well    Behavior During Therapy [facility] for tasks assessed/performed                     Past Medical History:  Diagnosis Date   Anemia    Anxiety    Bifascicular block    Diverticulitis    Diverticulosis    Gastroparesis    GERD (gastroesophageal reflux disease)    Glaucoma    Hemorrhoids    Hyperlipidemia    Hypertension    Hypertension    IBS (irritable bowel syndrome)    Rectal ulcer    SCOLIOSIS, LUMBAR SPINE 03/09/2010   Qualifier: Diagnosis of  By: Arnoldo Morale MD, John E    Segmental colitis [facility])    Tubular adenoma of colon 05/2009   Past Surgical History:  Procedure Laterality Date   APPENDECTOMY     ARCUATE KERATECTOMY     BILIARY DILATION  03/27/2021   Procedure: BILIARY DILATION;  Surgeon: [name], MD;  Location: [facility];  Service: Gastroenterology;;   CATARACT EXTRACTION, BILATERAL  2015   CHOLECYSTECTOMY N/A 03/28/2021   Procedure: LAPAROSCOPIC CHOLECYSTECTOMY;  Surgeon: Leighton Ruff, MD;  Location: [facility];  Service: General;  Laterality: N/A;   double pallital tori bone removed      ERCP N/A 03/27/2021   Procedure: ENDOSCOPIC RETROGRADE CHOLANGIOPANCREATOGRAPHY (ERCP);  Surgeon: [name], MD;  Location: [facility];  Service: Gastroenterology;  Laterality: N/A;   HERNIA REPAIR     umbilical   KNEE SURGERY Bilateral    morton's neuroma on rt foot and platar facial release     MOUTH SURGERY     "bone shaved off roof  of mouth"   REMOVAL OF STONES  03/27/2021   Procedure: REMOVAL OF STONES;  Surgeon: [name], MD;  Location: [facility];  Service: Gastroenterology;;   SALIVARY GLAND SURGERY     removal   SPHINCTEROTOMY  03/27/2021   Procedure: SPHINCTEROTOMY;  Surgeon: [name], MD;  Location: [facility];  Service: Gastroenterology;;   TONSILLECTOMY     Patient Active Problem List   Diagnosis Date Noted   CLL (chronic lymphocytic leukemia) ([place]) 09/30/2021   Cholangitis 03/26/2021   Cholelithiasis 03/25/2021   Elevated LFTs 03/25/2021   Chronic hyponatremia 03/25/2021   Bilateral primary osteoarthritis of knee 06/11/2019   Pain in left shoulder 05/22/2019   Unspecified vitamin D deficiency 12/25/2013   SPINAL STENOSIS, LUMBAR 03/09/2010   Osteoporosis 03/09/2010   MIGRAINE, OPHTHALMIC 10/29/2009   ADENOMATOUS COLONIC POLYP 06/10/2009   History of iron deficiency 05/06/2009   Carotid artery disease ([place]) 07/28/2008   Allergic rhinitis 03/20/2008   DYSPHAGIA 03/12/2008   Ganglion of tendon sheath 08/17/2007   Hypothyroidism 11/15/2006   Hyperlipidemia 11/07/2006   Essential hypertension 10/18/2006   GERD 10/18/2006   DIVERTICULOSIS, COLON 10/18/2006    PCP: [name], MD   REFERRING PROVIDER: [name], MD  REFERRING DIAG: R29.898 (ICD-10-CM) - Weakness of both lower extremities   THERAPY DIAG:  Chronic bilateral low back pain without sciatica  Difficulty in walking, not elsewhere classified  Muscle weakness (generalized)  Chronic bilateral low back pain with right-sided sciatica  Pain in left hip  Rationale for Evaluation and Treatment Rehabilitation  ONSET DATE: 09/09/2021   SUBJECTIVE:   SUBJECTIVE STATEMENT: Patient reports that her knees feel better. Her R knee is still somewhat sore, but not as bad as prior to her injections. She is scheduled for one more set next week.   PERTINENT HISTORY: L hip fracture. L4, L5 with nerve  impingement on the R, per patient. 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 No active treatment.  PAIN:  Are you having pain? Yes: NPRS scale: 4/10 Pain location: R hip and foot Pain description: pain in hip, numbness in foot Aggravating factors: activity, bending Relieving factors: heating pad, avoid ben=ding  PRECAUTIONS: None  WEIGHT BEARING RESTRICTIONS No  FALLS:  Has patient fallen in last 6 months? No  LIVING ENVIRONMENT: Lives with: lives with their family and lives alone Lives in: House/apartment Stairs: Yes: External: 1 steps; none Has following equipment at home: Single point cane  OCCUPATION: retired  PLOF: Independent  PATIENT GOALS Walk straight, preferably without a cane   OBJECTIVE:   DIAGNOSTIC FINDINGS: N/A  COGNITION:  Overall cognitive status: Within functional limits for tasks assessed     SENSATION: Not tested  EDEMA: None   MUSCLE LENGTH: Hamstrings: Right Approx 75 degrees Thomas test: Slightly beyond neutral B.  POSTURE: forward head, decreased lumbar lordosis, decreased thoracic kyphosis, flexed trunk , and Scoliosis with rib hump on L, R paraspinal hump at lumbar region, R shoulder significantly lower than L  PALPATION: Tightness noted throughout paraspinals. TTP medial L scapular border, L gluts.  LOWER EXTREMITY ROM: BLE ROM generally WFL, but R hip mildly diminished and painful.      LOWER EXTREMITY MMT:  MMT Right eval Left eval  Hip flexion 3+ 4  Hip extension 3 4-  Hip abduction 3 4-  Hip adduction    Hip internal rotation    Hip external rotation    Knee flexion 3+ 4  Knee extension 4- 4  Ankle dorsiflexion 4 4  Ankle plantarflexion    Ankle inversion    Ankle eversion     (Blank rows = not tested)    FUNCTIONAL TESTS:  5 times sit to stand: 22.00 Timed up and go (TUG): 19.28  GAIT: Distance walked: 43' Assistive device utilized: Single point  cane Level of assistance: Modified independence Comments: Small steps, shuffling, slow steps, patient reports she feels unsteady.   TODAY'S TREATMENT: 11/16/21 Supine SLR, 1# on R, 2 x 10 each leg, SLR with ER 2 x 10 reps each leg TUG 10.20, 19 sec Seated postural strength and control- hold ball against wall with posterior head while performing rows and sh horizontal abd, 2 x 10 each Lateral trunk stretch for R side, seated, reach L hand to floor with lateral flexion, raising R arm overhead to increase stretch.    11/11/21 Recumbent bike L3 x 6 minutes. Step ups onto 4" step with BUE support 10 reps each Side step ups onto 4" step, UUE support Crossover step ups onto 4" step, 10 reps each, UUE support Quick steps in all directions, changing upon therapist command 2 minutes, B HHA 4 square stepping for speed in both directions-30 sec  each SLS- alternately tapping each foot on cones in semicircle in front. She had much more difficulty standing on LLE due to hip weakness.  11/04/21 NuStep L5 x 6 minutes Supine DKTC on physioball for stretch 10 x 3 sec hold Lower trunk rotation on physioball 5x to each side Piriformis stretch over B B x 30 sec B bridge over ball x 10 reps B bridge over ball, rotating hips slightly to each side x 5 reps. Alternate SLR off ball while stabilizing with the opposite leg x 10 reps. Sit to stand x 5 reps.  11/01/21 Recumbent cycling L1 x 4 minutes Sit to stand from elevated mat while holding 1# ball in BUE 1 x 10 Side to side step with red Tband resistance at knees, x 10 reps, HHA. Sid e to side quick steps Forward and back quick steps Quick steps changing directions upon command of therapist- All performed with B HHA. Step ups on 4" step with B UE support on rails 10 reps each leg Lateral step ups on 4" step, 10 each leg.  10/28/21 NuStep L3 x 4 minutes, then 2 x 20 seconds at L5 Sit on physiodisc- ant/post, lat lower body weight shifts with improved  control noted. Static sitting on disc while reaching up and then into diagonals with BUE holding 2# ball 10 reps each. Side to side stepping with red T band resistance around knees, 10 reps to each side, B HHA 4 square stepping 5 times in each direction iwht straight cane and CGA, mild unsteadiness. Walked 100' with straight cane and MI demosntrating improved upright posture after treatment.   10/07/21 Sit <> stand x 10 reps, no hands,. Seated trunk mobilization- Large physioball at her back, practiced ant/post lower body weight shifts with ball and therapist providing tactile cues. Seated rotational weight shifts with hands placed on mat to her R. Weight shifts forward and back into diagonal x 6 reps with therapist facilitating lower trunk weight shifts in diagonal. Returned to seated lower trunk mobilizations with improved excursion, placed hands behind hips and added upper body ext 6 x 5 sec Supine chin tucks to elongate posterior neck, 8 x 10 sec. Step ups on 4" step with BUE support x 10, then side step ups with UUE support x 10 each.    10/06/21 Nu-Step L3 x 6 mins Sitting LE stretching- HS, figure 4, ITB. Patient had great difficulty achieving a stretch. Facilitated lower trunk mobilizations-ant/post, and lateral. Again, great diffiuclty, required max TC and VC on physiodisc. Ambulated in assessment with cane in R hand, L hand and holding cane up with BUE. She maintains L lateral trunk flexion, and forward flex, slightly improved while holding the cane. Her trunk is very immobile in any direction.  09/29/21 Nustep L3 x55mns Supine K2C 30s HS stretch 30s  Piriformis stretch 30s  Feet on ball trunk rotations 172m SLR x10, 2#x10 LAQ 2# 2x10 2 way hip 2# 2x10    STS 2x10 Step ups 4" w/1HHA   09/22/21 Nu-Step L3 x 6 minutes Supine stretches SKTC, Active HS, figure 4, ITB, 30 sec each side. Supine strength- bridging, SLR, SLR with ER, 10 each Standing hip flexor stretch facing wall,  mini squats x 10, rows, sh ext, sh ER with Red Tband  09/16/21 Patient education   PATIENT EDUCATION:  Education details: HEP Person educated: Patient Education method: ExConsulting civil engineerdeSystems developerhandout Education comprehension: verbalized understanding, return demo   HOME EXERCISE PROGRAM: [id number]:  CLINICAL IMPRESSION: Patient reports iR  knee medial swelling and pain, limiting her participation today. 10V re-assessment performed, although limited. She is demonstrating progress toward all LTG, but was not quite as mobile today due to the pain.   OBJECTIVE IMPAIRMENTS Abnormal gait, decreased activity tolerance, decreased balance, decreased coordination, decreased mobility, difficulty walking, decreased ROM, decreased strength, impaired flexibility, improper body mechanics, postural dysfunction, and pain.   ACTIVITY LIMITATIONS carrying, lifting, bending, standing, squatting, sleeping, stairs, reach over head, and locomotion level  PARTICIPATION LIMITATIONS: meal prep, cleaning, laundry, shopping, and yard work  PERSONAL FACTORS Age, Past/current experiences, and Time since onset of injury/illness/exacerbation are also affecting patient's functional outcome.   REHAB POTENTIAL: Good  CLINICAL DECISION MAKING: Stable/uncomplicated  EVALUATION COMPLEXITY: Low   GOALS: Goals reviewed with patient? Yes  SHORT TERM GOALS: Target date: 10/14/2021  I with basic HEP Baseline: initiated Goal status: ongoing  LONG TERM GOALS: Target date: 11/25/2021   I with final HEP Baseline:  Goal status: ongoing  2.  Perform TUG in < 12 seconds to demonstrate improved balance Baseline: 19.00 Goal status: 0ngoing  3.  Perform 5 x STS in < 12 seconds to demonstrate improved BLE strength Baseline: 22.00 Goal status: ongoing  4.  Patient will ambulate x at least 500' using LRAD, MI, on level and unlevel surfaces, demonstrating normalized gait pattern Baseline: Small steps,  shuffling, slow steps, patient reports she feels unsteady. Goal status: ongoing  5.  Patient will report LBP of <3/10 at any point during the day or evening. Baseline: Up to 5/10 today Goal status: ongoing    PLAN: PT FREQUENCY: 2x/week  PT DURATION: 10 weeks  PLANNED INTERVENTIONS: Therapeutic exercises, Therapeutic activity, Neuromuscular re-education, Balance training, Gait training, Patient/Family education, Joint mobilization, Dry Needling, Electrical stimulation, Cryotherapy, Moist heat, Traction, Ionotophoresis 87m/ml Dexamethasone, and Manual therapy  PLAN FOR NEXT SESSION:  strengthening.   SMarcelina Morel DPT 11/16/2021, 2:02 PM

## 2021-11-16 NOTE — Patient Outreach (Signed)
  Care Coordination   Initial Visit Note   11/16/2021 Name: Rebecca [name] MRN: [id number] DOB: [birth year]  Rebecca [name] is a 88 y.o. year old female who sees Yong Channel, Brayton Mars, MD for primary care. I spoke with  Joretta Bachelor by phone today  What matters to the patients health and wellness today?  I am doing fine and completed my therapy for my knee today    Goals Addressed             This Visit's Progress    COMPLETED: Care Coordination Activities - no follow up required       Care Coordination Interventions: Provided education to patient re: Annual Wellness Visit and care coordination services Reviewed medications with patient and discussed adherence Assessed social determinant of health barriers No further interventions needed          SDOH assessments and interventions completed:  Yes  SDOH Interventions Today    Flowsheet Row Most Recent Value  SDOH Interventions   Food Insecurity Interventions Intervention Not Indicated  Housing Interventions Intervention Not Indicated  Transportation Interventions Intervention Not Indicated        Care Coordination Interventions Activated:  Yes  Care Coordination Interventions:  Yes, provided   Follow up plan: No further intervention required.   Encounter Outcome:  Pt. Visit Completed  Peter Garter RN, BSN,CCM, CDE Care Management Coordinator [facility] [phone]

## 2021-11-16 NOTE — Patient Instructions (Signed)
Visit Information  Thank you for taking time to visit with me today. Please don't hesitate to contact me if I can be of assistance to you.   Following are the goals we discussed today:   Goals Addressed             This Visit's Progress    COMPLETED: Care Coordination Activities - no follow up required       Care Coordination Interventions: Provided education to patient re: Annual Wellness Visit and care coordination services Reviewed medications with patient and discussed adherence Assessed social determinant of health barriers No further interventions needed            If you are experiencing a Mental Health or [place] or need someone to talk to, please call the Suicide and Crisis Lifeline: 988 call the [place] National Suicide Prevention Lifeline: 236-435-3953 or TTY: 2548279582 TTY (220)743-8729) to talk to a trained counselor call 1-800-273-TALK (toll free, 24 hour hotline) go to [facility] [place] [phone]) call 911   Patient verbalizes understanding of instructions and care plan provided today and agrees to view in [place]. Active MyChart status and patient understanding of how to access instructions and care plan via MyChart confirmed with patient.     No further follow up required: call to schedule your Annual Wellness Visit   Peter Garter RN, [facility], Day Management Coordinator [place] [facility] [phone]

## 2021-11-17 ENCOUNTER — Encounter: Payer: Self-pay | Admitting: Physician Assistant

## 2021-11-17 ENCOUNTER — Ambulatory Visit (INDEPENDENT_AMBULATORY_CARE_PROVIDER_SITE_OTHER): Payer: Medicare Other | Admitting: Physician Assistant

## 2021-11-17 DIAGNOSIS — M170 Bilateral primary osteoarthritis of knee: Secondary | ICD-10-CM

## 2021-11-17 NOTE — Progress Notes (Signed)
Office Visit Note   Patient: Rebecca [name]           Date of Birth: 03/21/1933           MRN: [id number] Visit Date: 11/17/2021              Requested by: Marin Olp, MD [place],  [place] [address] PCP: Marin Olp, MD  Chief Complaint  Patient presents with   Right Knee - Follow-up      HPI: Ms. Chaudhuri comes in today with a complaint of right knee pain.  She is 6 days status post Orthovisc injections into both of her knees second in the series.  She said she did not have any pain right after the injections but started to notice pain and a little swelling in her right knee as she drove home.  She denies any fever chills or malaise.  She is better but still has a little bit of pain in the right knee.  Assessment & Plan: Visit Diagnoses: Osteoarthritis bilateral knees  Plan: She does not have any problems with the left knee and she does not have any effusion redness or heat in the right knee.  That being said I discussed with her holding off 1 week on the injections and she will follow-up next Thursday for her final set.  She is happy with this answer.  She will treat her knee symptomatically with a little ice  Follow-Up Instructions: 1 week  Ortho Exam  Patient is alert, oriented, no adenopathy, well-dressed, normal affect, normal respiratory effort. Examination of her right knee she does not have any effusion no warmth no erythema no cellulitis her compartments are soft and nontender.  No significant tenderness to palpation  Imaging: No results found. No images are attached to the encounter.  Labs: Lab Results  Component Value Date   HGBA1C 5.6 07/26/2006   ESRSEDRATE 22 02/10/2021   REPTSTATUS 03/04/2018 FINAL 03/03/2018   CULT  03/03/2018    NO GROWTH Performed at [facility], [place] [address] [place], [place] [address]    LABORGA NO GROWTH 06/10/2016     Lab Results  Component Value Date   ALBUMIN 4.2 08/13/2021    ALBUMIN 4.2 05/05/2021   ALBUMIN 4.0 04/09/2021    Lab Results  Component Value Date   MG 1.8 03/28/2021   Lab Results  Component Value Date   VD25OH 45 03/20/2020   VD25OH 47.49 01/01/2016   VD25OH 41.39 12/25/2013    No results found for: "PREALBUMIN"    Latest Ref Rng & Units 08/13/2021   10:36 AM 05/05/2021   12:38 PM 04/09/2021   11:56 AM  CBC EXTENDED  WBC 4.0 - 10.5 K/uL 19.7  22.1  19.2 Repeated and verified X2.   RBC 3.87 - 5.11 MIL/uL 3.65  3.56  3.35   Hemoglobin 12.0 - 15.0 g/dL 12.3  11.9  11.1   HCT 36.0 - 46.0 % 36.2  35.8  33.4   Platelets 150 - 400 K/uL 155  141  219.0   NEUT# 1.7 - 7.7 K/uL 3.0  3.4  3.3   Lymph# 0.7 - 4.0 K/uL 14.3  15.8  15.2      There is no height or weight on file to calculate BMI.  Orders:  No orders of the defined types were placed in this encounter.  No orders of the defined types were placed in this encounter.    Procedures: No procedures  performed  Clinical Data: No additional findings.  ROS:  All other systems negative, except as noted in the HPI. Review of Systems  Objective: Vital Signs: There were no vitals taken for this visit.  Specialty Comments:  No specialty comments available.  PMFS History: Patient Active Problem List   Diagnosis Date Noted   CLL (chronic lymphocytic leukemia) ([place]) 09/30/2021   Cholangitis 03/26/2021   Cholelithiasis 03/25/2021   Elevated LFTs 03/25/2021   Chronic hyponatremia 03/25/2021   Bilateral primary osteoarthritis of knee 06/11/2019   Pain in left shoulder 05/22/2019   Unspecified vitamin D deficiency 12/25/2013   SPINAL STENOSIS, LUMBAR 03/09/2010   Osteoporosis 03/09/2010   MIGRAINE, OPHTHALMIC 10/29/2009   ADENOMATOUS COLONIC POLYP 06/10/2009   History of iron deficiency 05/06/2009   Carotid artery disease ([place]) 07/28/2008   Allergic rhinitis 03/20/2008   DYSPHAGIA 03/12/2008   Ganglion of tendon sheath 08/17/2007   Hypothyroidism 11/15/2006   Hyperlipidemia  11/07/2006   Essential hypertension 10/18/2006   GERD 10/18/2006   DIVERTICULOSIS, COLON 10/18/2006   Past Medical History:  Diagnosis Date   Anemia    Anxiety    Bifascicular block    Diverticulitis    Diverticulosis    Gastroparesis    GERD (gastroesophageal reflux disease)    Glaucoma    Hemorrhoids    Hyperlipidemia    Hypertension    Hypertension    IBS (irritable bowel syndrome)    Rectal ulcer    SCOLIOSIS, LUMBAR SPINE 03/09/2010   Qualifier: Diagnosis of  By: Arnoldo Morale MD, John E    Segmental colitis ([place])    Tubular adenoma of colon 05/2009    Family History  Problem Relation Age of Onset   Diabetes Mother    Heart disease Mother    Hyperlipidemia Mother    Hypertension Mother    Anuerysm Father        abdominal- heavy smoker   Heart disease Father    Hypertension Father    Colon polyps Sister    Lung cancer Sister        smoker   Colon polyps Brother    Diabetes Brother    Hyperlipidemia Brother    Hypertension Brother    Heart disease Brother    Colon cancer Paternal Grandmother        with possible stomach cancer   Migraines Daughter    Kidney Stones Daughter    Colon polyps Daughter    Breast cancer Daughter    Cancer Daughter        salivary   Colon cancer Maternal Aunt        Rectal cancer   Crohn's disease Neg Hx    Pancreatic cancer Neg Hx     Past Surgical History:  Procedure Laterality Date   APPENDECTOMY     ARCUATE KERATECTOMY     BILIARY DILATION  03/27/2021   Procedure: BILIARY DILATION;  Surgeon: [name], MD;  Location: [facility];  Service: Gastroenterology;;   CATARACT EXTRACTION, BILATERAL  2015   CHOLECYSTECTOMY N/A 03/28/2021   Procedure: LAPAROSCOPIC CHOLECYSTECTOMY;  Surgeon: Leighton Ruff, MD;  Location: [facility];  Service: General;  Laterality: N/A;   double pallital tori bone removed      ERCP N/A 03/27/2021   Procedure: ENDOSCOPIC RETROGRADE CHOLANGIOPANCREATOGRAPHY (ERCP);  Surgeon: [name], MD;   Location: [facility];  Service: Gastroenterology;  Laterality: N/A;   HERNIA REPAIR     umbilical   KNEE SURGERY Bilateral    morton's neuroma on  rt foot and platar facial release     MOUTH SURGERY     "bone shaved off roof of mouth"   REMOVAL OF STONES  03/27/2021   Procedure: REMOVAL OF STONES;  Surgeon: [name], MD;  Location: [facility];  Service: Gastroenterology;;   SALIVARY GLAND SURGERY     removal   SPHINCTEROTOMY  03/27/2021   Procedure: SPHINCTEROTOMY;  Surgeon: [name], MD;  Location: [facility];  Service: Gastroenterology;;   TONSILLECTOMY     Social History   Occupational History   Occupation: Glass blower/designer    Comment: retired  Tobacco Use   Smoking status: Never   Smokeless tobacco: Never  Vaping Use   Vaping Use: Never used  Substance and Sexual Activity   Alcohol use: Yes    Alcohol/week: 0.0 standard drinks of alcohol    Comment: rare   Drug use: No   Sexual activity: Not Currently

## 2021-11-18 ENCOUNTER — Ambulatory Visit: Payer: Medicare Other | Admitting: Physical Therapy

## 2021-11-19 ENCOUNTER — Ambulatory Visit: Payer: Medicare Other | Admitting: Physical Therapy

## 2021-11-19 ENCOUNTER — Encounter: Payer: Self-pay | Admitting: Physical Therapy

## 2021-11-19 DIAGNOSIS — R262 Difficulty in walking, not elsewhere classified: Secondary | ICD-10-CM

## 2021-11-19 DIAGNOSIS — M6281 Muscle weakness (generalized): Secondary | ICD-10-CM

## 2021-11-19 DIAGNOSIS — M5441 Lumbago with sciatica, right side: Secondary | ICD-10-CM | POA: Diagnosis not present

## 2021-11-19 DIAGNOSIS — M545 Low back pain, unspecified: Secondary | ICD-10-CM

## 2021-11-19 DIAGNOSIS — M25552 Pain in left hip: Secondary | ICD-10-CM | POA: Diagnosis not present

## 2021-11-19 DIAGNOSIS — G8929 Other chronic pain: Secondary | ICD-10-CM | POA: Diagnosis not present

## 2021-11-19 NOTE — Therapy (Signed)
OUTPATIENT PHYSICAL THERAPY LOWER EXTREMITY TREATMENT  Progress Note Reporting Period 09/16/21 to 11/16/21  See note below for Objective Data and Assessment of Progress/Goals.      Patient Name: Rebecca [name] MRN: [id number] DOB:[birth year], 88 y.o., female Today's Date: 11/19/2021   PT End of Session - 11/19/21 1110     Visit Number 11    Date for PT Re-Evaluation 11/25/21    PT Start Time 1104    PT Stop Time 1143    PT Time Calculation (min) 39 min    Activity Tolerance Patient tolerated treatment well    Behavior During Therapy WFL for tasks assessed/performed                      Past Medical History:  Diagnosis Date   Anemia    Anxiety    Bifascicular block    Diverticulitis    Diverticulosis    Gastroparesis    GERD (gastroesophageal reflux disease)    Glaucoma    Hemorrhoids    Hyperlipidemia    Hypertension    Hypertension    IBS (irritable bowel syndrome)    Rectal ulcer    SCOLIOSIS, LUMBAR SPINE 03/09/2010   Qualifier: Diagnosis of  By: Arnoldo Morale MD, John E    Segmental colitis [facility])    Tubular adenoma of colon 05/2009   Past Surgical History:  Procedure Laterality Date   APPENDECTOMY     ARCUATE KERATECTOMY     BILIARY DILATION  03/27/2021   Procedure: BILIARY DILATION;  Surgeon: [name], MD;  Location: [facility];  Service: Gastroenterology;;   CATARACT EXTRACTION, BILATERAL  2015   CHOLECYSTECTOMY N/A 03/28/2021   Procedure: LAPAROSCOPIC CHOLECYSTECTOMY;  Surgeon: Leighton Ruff, MD;  Location: [facility];  Service: General;  Laterality: N/A;   double pallital tori bone removed      ERCP N/A 03/27/2021   Procedure: ENDOSCOPIC RETROGRADE CHOLANGIOPANCREATOGRAPHY (ERCP);  Surgeon: [name], MD;  Location: [facility];  Service: Gastroenterology;  Laterality: N/A;   HERNIA REPAIR     umbilical   KNEE SURGERY Bilateral    morton's neuroma on rt foot and platar facial release     MOUTH SURGERY     "bone shaved off  roof of mouth"   REMOVAL OF STONES  03/27/2021   Procedure: REMOVAL OF STONES;  Surgeon: [name], MD;  Location: [facility];  Service: Gastroenterology;;   SALIVARY GLAND SURGERY     removal   SPHINCTEROTOMY  03/27/2021   Procedure: SPHINCTEROTOMY;  Surgeon: [name], MD;  Location: [facility];  Service: Gastroenterology;;   TONSILLECTOMY     Patient Active Problem List   Diagnosis Date Noted   CLL (chronic lymphocytic leukemia) ([place]) 09/30/2021   Cholangitis 03/26/2021   Cholelithiasis 03/25/2021   Elevated LFTs 03/25/2021   Chronic hyponatremia 03/25/2021   Bilateral primary osteoarthritis of knee 06/11/2019   Pain in left shoulder 05/22/2019   Unspecified vitamin D deficiency 12/25/2013   SPINAL STENOSIS, LUMBAR 03/09/2010   Osteoporosis 03/09/2010   MIGRAINE, OPHTHALMIC 10/29/2009   ADENOMATOUS COLONIC POLYP 06/10/2009   History of iron deficiency 05/06/2009   Carotid artery disease ([place]) 07/28/2008   Allergic rhinitis 03/20/2008   DYSPHAGIA 03/12/2008   Ganglion of tendon sheath 08/17/2007   Hypothyroidism 11/15/2006   Hyperlipidemia 11/07/2006   Essential hypertension 10/18/2006   GERD 10/18/2006   DIVERTICULOSIS, COLON 10/18/2006    PCP: [name], MD   REFERRING PROVIDER: [name], MD  REFERRING DIAG: R29.898 (ICD-10-CM) - Weakness of both lower extremities   THERAPY DIAG:  Chronic bilateral low back pain without sciatica  Difficulty in walking, not elsewhere classified  Muscle weakness (generalized)  Chronic bilateral low back pain with right-sided sciatica  Pain in left hip  Rationale for Evaluation and Treatment Rehabilitation  ONSET DATE: 09/09/2021   SUBJECTIVE:   SUBJECTIVE STATEMENT: Patient reports that her R knee remains swollen and sore. She went to her Dr, saw the PA who does not feel it is infected, but did not give her much other guidance. She returns to the Dr next week.   PERTINENT  HISTORY: L hip fracture. L4, L5 with nerve impingement on the R, per patient. 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 No active treatment.  PAIN:  Are you having pain? Yes: NPRS scale: 4/10 Pain location: R hip and foot Pain description: pain in hip, numbness in foot Aggravating factors: activity, bending Relieving factors: heating pad, avoid ben=ding  PRECAUTIONS: None  WEIGHT BEARING RESTRICTIONS No  FALLS:  Has patient fallen in last 6 months? No  LIVING ENVIRONMENT: Lives with: lives with their family and lives alone Lives in: House/apartment Stairs: Yes: External: 1 steps; none Has following equipment at home: Single point cane  OCCUPATION: retired  PLOF: Independent  PATIENT GOALS Walk straight, preferably without a cane   OBJECTIVE:   DIAGNOSTIC FINDINGS: N/A  COGNITION:  Overall cognitive status: Within functional limits for tasks assessed     SENSATION: Not tested  EDEMA: None   MUSCLE LENGTH: Hamstrings: Right Approx 75 degrees Thomas test: Slightly beyond neutral B.  POSTURE: forward head, decreased lumbar lordosis, decreased thoracic kyphosis, flexed trunk , and Scoliosis with rib hump on L, R paraspinal hump at lumbar region, R shoulder significantly lower than L  PALPATION: Tightness noted throughout paraspinals. TTP medial L scapular border, L gluts.  LOWER EXTREMITY ROM: BLE ROM generally WFL, but R hip mildly diminished and painful.      LOWER EXTREMITY MMT:  MMT Right eval Left eval  Hip flexion 3+ 4  Hip extension 3 4-  Hip abduction 3 4-  Hip adduction    Hip internal rotation    Hip external rotation    Knee flexion 3+ 4  Knee extension 4- 4  Ankle dorsiflexion 4 4  Ankle plantarflexion    Ankle inversion    Ankle eversion     (Blank rows = not tested)    FUNCTIONAL TESTS:  5 times sit to stand: 22.00 Timed up and go (TUG): 19.28  GAIT: Distance walked:  41' Assistive device utilized: Single point cane Level of assistance: Modified independence Comments: Small steps, shuffling, slow steps, patient reports she feels unsteady.   TODAY'S TREATMENT: 11/19/21 Recumbent bike L2.5 x 6 minutes Seated rotation to R, place hands on mat and walk them out away from trunk and back. 5 x to each side. Lan to R on elbow. Elevate LLE up and out to the side, touch top ov mat with heel. 2 x 5 to each side. Supine with legs over physioball-bridge, bridge with IR, B knees to chest, B KTC in diagonals, lower trunk rotation, 10 reps Standing with back to wall, chin tuck to hold ball against wall while performing horizontal abd against red tband resistance. Side flexion to L keeping back against wall. Heel raises on 4" step, 2 x 10 reps Low back stretch over large Blue physioball, 5 x 5 sec  hold straight, L , and R  11/16/21 Supine SLR, 1# on R, 2 x 10 each leg, SLR with ER 2 x 10 reps each leg TUG 10.20, 19 sec Seated postural strength and control- hold ball against wall with posterior head while performing rows and sh horizontal abd, 2 x 10 each Lateral trunk stretch for R side, seated, reach L hand to floor with lateral flexion, raising R arm overhead to increase stretch.  11/11/21 Recumbent bike L3 x 6 minutes. Step ups onto 4" step with BUE support 10 reps each Side step ups onto 4" step, UUE support Crossover step ups onto 4" step, 10 reps each, UUE support Quick steps in all directions, changing upon therapist command 2 minutes, B HHA 4 square stepping for speed in both directions-30 sec each SLS- alternately tapping each foot on cones in semicircle in front. She had much more difficulty standing on LLE due to hip weakness.  11/04/21 NuStep L5 x 6 minutes Supine DKTC on physioball for stretch 10 x 3 sec hold Lower trunk rotation on physioball 5x to each side Piriformis stretch over B B x 30 sec B bridge over ball x 10 reps B bridge over ball, rotating  hips slightly to each side x 5 reps. Alternate SLR off ball while stabilizing with the opposite leg x 10 reps. Sit to stand x 5 reps.  11/01/21 Recumbent cycling L1 x 4 minutes Sit to stand from elevated mat while holding 1# ball in BUE 1 x 10 Side to side step with red Tband resistance at knees, x 10 reps, HHA. Sid e to side quick steps Forward and back quick steps Quick steps changing directions upon command of therapist- All performed with B HHA. Step ups on 4" step with B UE support on rails 10 reps each leg Lateral step ups on 4" step, 10 each leg.  10/28/21 NuStep L3 x 4 minutes, then 2 x 20 seconds at L5 Sit on physiodisc- ant/post, lat lower body weight shifts with improved control noted. Static sitting on disc while reaching up and then into diagonals with BUE holding 2# ball 10 reps each. Side to side stepping with red T band resistance around knees, 10 reps to each side, B HHA 4 square stepping 5 times in each direction iwht straight cane and CGA, mild unsteadiness. Walked 100' with straight cane and MI demosntrating improved upright posture after treatment.   10/07/21 Sit <> stand x 10 reps, no hands,. Seated trunk mobilization- Large physioball at her back, practiced ant/post lower body weight shifts with ball and therapist providing tactile cues. Seated rotational weight shifts with hands placed on mat to her R. Weight shifts forward and back into diagonal x 6 reps with therapist facilitating lower trunk weight shifts in diagonal. Returned to seated lower trunk mobilizations with improved excursion, placed hands behind hips and added upper body ext 6 x 5 sec Supine chin tucks to elongate posterior neck, 8 x 10 sec. Step ups on 4" step with BUE support x 10, then side step ups with UUE support x 10 each.    10/06/21 Nu-Step L3 x 6 mins Sitting LE stretching- HS, figure 4, ITB. Patient had great difficulty achieving a stretch. Facilitated lower trunk mobilizations-ant/post,  and lateral. Again, great diffiuclty, required max TC and VC on physiodisc. Ambulated in assessment with cane in R hand, L hand and holding cane up with BUE. She maintains L lateral trunk flexion, and forward flex, slightly improved while holding the cane. Her  trunk is very immobile in any direction.  09/29/21 Nustep L3 x92mns Supine K2C 30s HS stretch 30s  Piriformis stretch 30s  Feet on ball trunk rotations 131m SLR x10, 2#x10 LAQ 2# 2x10 2 way hip 2# 2x10    STS 2x10 Step ups 4" w/1HHA   09/22/21 Nu-Step L3 x 6 minutes Supine stretches SKTC, Active HS, figure 4, ITB, 30 sec each side. Supine strength- bridging, SLR, SLR with ER, 10 each Standing hip flexor stretch facing wall, mini squats x 10, rows, sh ext, sh ER with Red Tband  09/16/21 Patient education   PATIENT EDUCATION:  Education details: HEP Person educated: Patient Education method: ExConsulting civil engineerdeSystems developerhandout Education comprehension: verbalized understanding, return demo   HOME EXERCISE PROGRAM: [id number]:  CLINICAL IMPRESSION: Patient reports R knee pain and swelling continue today. She saw the PA who does not feel the knee is infected, gave no further instruction. Treatment focused again on trunk strength and mobility.   OBJECTIVE IMPAIRMENTS Abnormal gait, decreased activity tolerance, decreased balance, decreased coordination, decreased mobility, difficulty walking, decreased ROM, decreased strength, impaired flexibility, improper body mechanics, postural dysfunction, and pain.   ACTIVITY LIMITATIONS carrying, lifting, bending, standing, squatting, sleeping, stairs, reach over head, and locomotion level  PARTICIPATION LIMITATIONS: meal prep, cleaning, laundry, shopping, and yard work  PERSONAL FACTORS Age, Past/current experiences, and Time since onset of injury/illness/exacerbation are also affecting patient's functional outcome.   REHAB POTENTIAL: Good  CLINICAL DECISION MAKING:  Stable/uncomplicated  EVALUATION COMPLEXITY: Low   GOALS: Goals reviewed with patient? Yes  SHORT TERM GOALS: Target date: 10/14/2021  I with basic HEP Baseline: initiated Goal status: ongoing  LONG TERM GOALS: Target date: 11/25/2021   I with final HEP Baseline:  Goal status: ongoing  2.  Perform TUG in < 12 seconds to demonstrate improved balance Baseline: 19.00 Goal status: 0ngoing  3.  Perform 5 x STS in < 12 seconds to demonstrate improved BLE strength Baseline: 22.00 Goal status: ongoing  4.  Patient will ambulate x at least 500' using LRAD, MI, on level and unlevel surfaces, demonstrating normalized gait pattern Baseline: Small steps, shuffling, slow steps, patient reports she feels unsteady. Goal status: ongoing  5.  Patient will report LBP of <3/10 at any point during the day or evening. Baseline: Up to 5/10 today Goal status: ongoing    PLAN: PT FREQUENCY: 2x/week  PT DURATION: 10 weeks  PLANNED INTERVENTIONS: Therapeutic exercises, Therapeutic activity, Neuromuscular re-education, Balance training, Gait training, Patient/Family education, Joint mobilization, Dry Needling, Electrical stimulation, Cryotherapy, Moist heat, Traction, Ionotophoresis 61m21ml Dexamethasone, and Manual therapy  PLAN FOR NEXT SESSION:  strengthening.   SusMarcelina MorelPT 11/19/2021, 11:42 AM

## 2021-11-22 ENCOUNTER — Ambulatory Visit: Payer: Medicare Other | Admitting: Physician Assistant

## 2021-11-23 ENCOUNTER — Ambulatory Visit: Payer: Medicare Other | Admitting: Physical Therapy

## 2021-11-23 ENCOUNTER — Encounter: Payer: Self-pay | Admitting: Physical Therapy

## 2021-11-23 DIAGNOSIS — M6281 Muscle weakness (generalized): Secondary | ICD-10-CM

## 2021-11-23 DIAGNOSIS — R262 Difficulty in walking, not elsewhere classified: Secondary | ICD-10-CM

## 2021-11-23 DIAGNOSIS — M25552 Pain in left hip: Secondary | ICD-10-CM

## 2021-11-23 DIAGNOSIS — G8929 Other chronic pain: Secondary | ICD-10-CM

## 2021-11-23 DIAGNOSIS — M545 Low back pain, unspecified: Secondary | ICD-10-CM | POA: Diagnosis not present

## 2021-11-23 DIAGNOSIS — M5441 Lumbago with sciatica, right side: Secondary | ICD-10-CM | POA: Diagnosis not present

## 2021-11-23 NOTE — Therapy (Signed)
OUTPATIENT PHYSICAL THERAPY LOWER EXTREMITY TREATMENT  Progress Note Reporting Period 09/16/21 to 11/16/21  See note below for Objective Data and Assessment of Progress/Goals.      Patient Name: Rebecca [name] MRN: [id number] DOB:[birth year], 88 y.o., female Today's Date: 11/23/2021   PT End of Session - 11/23/21 1332     Visit Number 12    Date for PT Re-Evaluation 11/25/21    PT Start Time 1321    PT Stop Time 1359    PT Time Calculation (min) 38 min    Activity Tolerance Patient tolerated treatment well    Behavior During Therapy WFL for tasks assessed/performed                       Past Medical History:  Diagnosis Date   Anemia    Anxiety    Bifascicular block    Diverticulitis    Diverticulosis    Gastroparesis    GERD (gastroesophageal reflux disease)    Glaucoma    Hemorrhoids    Hyperlipidemia    Hypertension    Hypertension    IBS (irritable bowel syndrome)    Rectal ulcer    SCOLIOSIS, LUMBAR SPINE 03/09/2010   Qualifier: Diagnosis of  By: Arnoldo Morale MD, John E    Segmental colitis [facility])    Tubular adenoma of colon 05/2009   Past Surgical History:  Procedure Laterality Date   APPENDECTOMY     ARCUATE KERATECTOMY     BILIARY DILATION  03/27/2021   Procedure: BILIARY DILATION;  Surgeon: [name], MD;  Location: [facility];  Service: Gastroenterology;;   CATARACT EXTRACTION, BILATERAL  2015   CHOLECYSTECTOMY N/A 03/28/2021   Procedure: LAPAROSCOPIC CHOLECYSTECTOMY;  Surgeon: Leighton Ruff, MD;  Location: [facility];  Service: General;  Laterality: N/A;   double pallital tori bone removed      ERCP N/A 03/27/2021   Procedure: ENDOSCOPIC RETROGRADE CHOLANGIOPANCREATOGRAPHY (ERCP);  Surgeon: [name], MD;  Location: [facility];  Service: Gastroenterology;  Laterality: N/A;   HERNIA REPAIR     umbilical   KNEE SURGERY Bilateral    morton's neuroma on rt foot and platar facial release     MOUTH SURGERY     "bone shaved off  roof of mouth"   REMOVAL OF STONES  03/27/2021   Procedure: REMOVAL OF STONES;  Surgeon: [name], MD;  Location: [facility];  Service: Gastroenterology;;   SALIVARY GLAND SURGERY     removal   SPHINCTEROTOMY  03/27/2021   Procedure: SPHINCTEROTOMY;  Surgeon: [name], MD;  Location: [facility];  Service: Gastroenterology;;   TONSILLECTOMY     Patient Active Problem List   Diagnosis Date Noted   CLL (chronic lymphocytic leukemia) ([place]) 09/30/2021   Cholangitis 03/26/2021   Cholelithiasis 03/25/2021   Elevated LFTs 03/25/2021   Chronic hyponatremia 03/25/2021   Bilateral primary osteoarthritis of knee 06/11/2019   Pain in left shoulder 05/22/2019   Unspecified vitamin D deficiency 12/25/2013   SPINAL STENOSIS, LUMBAR 03/09/2010   Osteoporosis 03/09/2010   MIGRAINE, OPHTHALMIC 10/29/2009   ADENOMATOUS COLONIC POLYP 06/10/2009   History of iron deficiency 05/06/2009   Carotid artery disease ([place]) 07/28/2008   Allergic rhinitis 03/20/2008   DYSPHAGIA 03/12/2008   Ganglion of tendon sheath 08/17/2007   Hypothyroidism 11/15/2006   Hyperlipidemia 11/07/2006   Essential hypertension 10/18/2006   GERD 10/18/2006   DIVERTICULOSIS, COLON 10/18/2006    PCP: [name], MD   REFERRING PROVIDER: [name],  MD   REFERRING DIAG: R29.898 (ICD-10-CM) - Weakness of both lower extremities   THERAPY DIAG:  Chronic bilateral low back pain without sciatica  Difficulty in walking, not elsewhere classified  Muscle weakness (generalized)  Chronic bilateral low back pain with right-sided sciatica  Pain in left hip  Rationale for Evaluation and Treatment Rehabilitation  ONSET DATE: 09/09/2021   SUBJECTIVE:   SUBJECTIVE STATEMENT: Patient reports that she did not sleep well last night due to ear pain. She has an appointment with an ENT on Friday.   PERTINENT HISTORY: L hip fracture. L4, L5 with nerve impingement on the R, per patient. 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 No active treatment.  PAIN:  Are you having pain? Yes: NPRS scale: 4/10 Pain location: R hip and foot Pain description: pain in hip, numbness in foot Aggravating factors: activity, bending Relieving factors: heating pad, avoid ben=ding  PRECAUTIONS: None  WEIGHT BEARING RESTRICTIONS No  FALLS:  Has patient fallen in last 6 months? No  LIVING ENVIRONMENT: Lives with: lives with their family and lives alone Lives in: House/apartment Stairs: Yes: External: 1 steps; none Has following equipment at home: Single point cane  OCCUPATION: retired  PLOF: Independent  PATIENT GOALS Walk straight, preferably without a cane   OBJECTIVE:   DIAGNOSTIC FINDINGS: N/A  COGNITION:  Overall cognitive status: Within functional limits for tasks assessed     SENSATION: Not tested  EDEMA: None   MUSCLE LENGTH: Hamstrings: Right Approx 75 degrees Thomas test: Slightly beyond neutral B.  POSTURE: forward head, decreased lumbar lordosis, decreased thoracic kyphosis, flexed trunk , and Scoliosis with rib hump on L, R paraspinal hump at lumbar region, R shoulder significantly lower than L  PALPATION: Tightness noted throughout paraspinals. TTP medial L scapular border, L gluts.  LOWER EXTREMITY ROM: BLE ROM generally WFL, but R hip mildly diminished and painful.      LOWER EXTREMITY MMT:  MMT Right eval Left eval  Hip flexion 3+ 4  Hip extension 3 4-  Hip abduction 3 4-  Hip adduction    Hip internal rotation    Hip external rotation    Knee flexion 3+ 4  Knee extension 4- 4  Ankle dorsiflexion 4 4  Ankle plantarflexion    Ankle inversion    Ankle eversion     (Blank rows = not tested)    FUNCTIONAL TESTS:  5 times sit to stand: 22.00 Timed up and go (TUG): 19.28  GAIT: Distance walked: 42' Assistive device utilized: Single point cane Level of assistance: Modified  independence Comments: Small steps, shuffling, slow steps, patient reports she feels unsteady.   TODAY'S TREATMENT: 11/23/21 Recumbent bike L3 x 6 minutes Seated alternated arm and leg raise with upright posture. 10 reps each side. Sit to stand from elevated surface due to knee pain from lower surface, 10 reps Mini squats x 10 Seated clamshells against Red Tband resistance, 2 x 10 reps. Seated with B legs over red physioball. Stabilize through trunk while moving one foot to floor and lifting back onto the ball, stabilizing with other foot. 10 reps each foot. B side stepping up onto Airex pad and then down, 10 times to each side. Seated trunk stability. Hold ball against wall with chin tuck while performing rows and shoulder ext against red Tband, 10 reps each. Seated lateral stretch, 2 x 5 reps to each side, emphasizing R trunk stretch.   11/19/21 Recumbent bike L2.5 x  6 minutes Seated rotation to R, place hands on mat and walk them out away from trunk and back. 5 x to each side. Lan to R on elbow. Elevate LLE up and out to the side, touch top ov mat with heel. 2 x 5 to each side. Supine with legs over physioball-bridge, bridge with IR, B knees to chest, B KTC in diagonals, lower trunk rotation, 10 reps Standing with back to wall, chin tuck to hold ball against wall while performing horizontal abd against red tband resistance. Side flexion to L keeping back against wall. Heel raises on 4" step, 2 x 10 reps Low back stretch over large Blue physioball, 5 x 5 sec hold straight, L , and R  11/16/21 Supine SLR, 1# on R, 2 x 10 each leg, SLR with ER 2 x 10 reps each leg TUG 10.20, 19 sec Seated postural strength and control- hold ball against wall with posterior head while performing rows and sh horizontal abd, 2 x 10 each Lateral trunk stretch for R side, seated, reach L hand to floor with lateral flexion, raising R arm overhead to increase stretch.  11/11/21 Recumbent bike L3 x 6  minutes. Step ups onto 4" step with BUE support 10 reps each Side step ups onto 4" step, UUE support Crossover step ups onto 4" step, 10 reps each, UUE support Quick steps in all directions, changing upon therapist command 2 minutes, B HHA 4 square stepping for speed in both directions-30 sec each SLS- alternately tapping each foot on cones in semicircle in front. She had much more difficulty standing on LLE due to hip weakness.  11/04/21 NuStep L5 x 6 minutes Supine DKTC on physioball for stretch 10 x 3 sec hold Lower trunk rotation on physioball 5x to each side Piriformis stretch over B B x 30 sec B bridge over ball x 10 reps B bridge over ball, rotating hips slightly to each side x 5 reps. Alternate SLR off ball while stabilizing with the opposite leg x 10 reps. Sit to stand x 5 reps.  11/01/21 Recumbent cycling L1 x 4 minutes Sit to stand from elevated mat while holding 1# ball in BUE 1 x 10 Side to side step with red Tband resistance at knees, x 10 reps, HHA. Sid e to side quick steps Forward and back quick steps Quick steps changing directions upon command of therapist- All performed with B HHA. Step ups on 4" step with B UE support on rails 10 reps each leg Lateral step ups on 4" step, 10 each leg.  10/28/21 NuStep L3 x 4 minutes, then 2 x 20 seconds at L5 Sit on physiodisc- ant/post, lat lower body weight shifts with improved control noted. Static sitting on disc while reaching up and then into diagonals with BUE holding 2# ball 10 reps each. Side to side stepping with red T band resistance around knees, 10 reps to each side, B HHA 4 square stepping 5 times in each direction iwht straight cane and CGA, mild unsteadiness. Walked 100' with straight cane and MI demosntrating improved upright posture after treatment.   10/07/21 Sit <> stand x 10 reps, no hands,. Seated trunk mobilization- Large physioball at her back, practiced ant/post lower body weight shifts with ball and  therapist providing tactile cues. Seated rotational weight shifts with hands placed on mat to her R. Weight shifts forward and back into diagonal x 6 reps with therapist facilitating lower trunk weight shifts in diagonal. Returned to seated lower trunk mobilizations  with improved excursion, placed hands behind hips and added upper body ext 6 x 5 sec Supine chin tucks to elongate posterior neck, 8 x 10 sec. Step ups on 4" step with BUE support x 10, then side step ups with UUE support x 10 each.    10/06/21 Nu-Step L3 x 6 mins Sitting LE stretching- HS, figure 4, ITB. Patient had great difficulty achieving a stretch. Facilitated lower trunk mobilizations-ant/post, and lateral. Again, great diffiuclty, required max TC and VC on physiodisc. Ambulated in assessment with cane in R hand, L hand and holding cane up with BUE. She maintains L lateral trunk flexion, and forward flex, slightly improved while holding the cane. Her trunk is very immobile in any direction.  09/29/21 Nustep L3 x67mns Supine K2C 30s HS stretch 30s  Piriformis stretch 30s  Feet on ball trunk rotations 193m SLR x10, 2#x10 LAQ 2# 2x10 2 way hip 2# 2x10    STS 2x10 Step ups 4" w/1HHA   09/22/21 Nu-Step L3 x 6 minutes Supine stretches SKTC, Active HS, figure 4, ITB, 30 sec each side. Supine strength- bridging, SLR, SLR with ER, 10 each Standing hip flexor stretch facing wall, mini squats x 10, rows, sh ext, sh ER with Red Tband  09/16/21 Patient education   PATIENT EDUCATION:  Education details: HEP Person educated: Patient Education method: ExConsulting civil engineerdeSystems developerhandout Education comprehension: verbalized understanding, return demo   HOME EXERCISE PROGRAM: [id number]:  CLINICAL IMPRESSION: Patient reports L ear pain kept her from sleeping well and she wanted to take it easy today. Treatment focused on trunk strength and stability, avoided supine due to ear pain.   OBJECTIVE IMPAIRMENTS Abnormal  gait, decreased activity tolerance, decreased balance, decreased coordination, decreased mobility, difficulty walking, decreased ROM, decreased strength, impaired flexibility, improper body mechanics, postural dysfunction, and pain.   ACTIVITY LIMITATIONS carrying, lifting, bending, standing, squatting, sleeping, stairs, reach over head, and locomotion level  PARTICIPATION LIMITATIONS: meal prep, cleaning, laundry, shopping, and yard work  PERSONAL FACTORS Age, Past/current experiences, and Time since onset of injury/illness/exacerbation are also affecting patient's functional outcome.   REHAB POTENTIAL: Good  CLINICAL DECISION MAKING: Stable/uncomplicated  EVALUATION COMPLEXITY: Low   GOALS: Goals reviewed with patient? Yes  SHORT TERM GOALS: Target date: 10/14/2021  I with basic HEP Baseline: initiated Goal status: ongoing  LONG TERM GOALS: Target date: 11/25/2021   I with final HEP Baseline:  Goal status: ongoing  2.  Perform TUG in < 12 seconds to demonstrate improved balance Baseline: 19.00 Goal status: 0ngoing  3.  Perform 5 x STS in < 12 seconds to demonstrate improved BLE strength Baseline: 22.00 Goal status: ongoing  4.  Patient will ambulate x at least 500' using LRAD, MI, on level and unlevel surfaces, demonstrating normalized gait pattern Baseline: Small steps, shuffling, slow steps, patient reports she feels unsteady. Goal status: ongoing  5.  Patient will report LBP of <3/10 at any point during the day or evening. Baseline: Up to 5/10 today Goal status: ongoing    PLAN: PT FREQUENCY: 2x/week  PT DURATION: 10 weeks  PLANNED INTERVENTIONS: Therapeutic exercises, Therapeutic activity, Neuromuscular re-education, Balance training, Gait training, Patient/Family education, Joint mobilization, Dry Needling, Electrical stimulation, Cryotherapy, Moist heat, Traction, Ionotophoresis 7m88ml Dexamethasone, and Manual therapy  PLAN FOR NEXT SESSION:  re-assess for  [facility]   SusMarcelina MorelPT 11/23/2021, 2:03 PM

## 2021-11-25 ENCOUNTER — Ambulatory Visit: Payer: Medicare Other | Admitting: Physical Therapy

## 2021-11-25 ENCOUNTER — Encounter: Payer: Self-pay | Admitting: Physical Therapy

## 2021-11-25 ENCOUNTER — Ambulatory Visit (INDEPENDENT_AMBULATORY_CARE_PROVIDER_SITE_OTHER): Payer: Medicare Other | Admitting: Orthopaedic Surgery

## 2021-11-25 ENCOUNTER — Encounter: Payer: Self-pay | Admitting: Orthopaedic Surgery

## 2021-11-25 DIAGNOSIS — M25552 Pain in left hip: Secondary | ICD-10-CM

## 2021-11-25 DIAGNOSIS — G8929 Other chronic pain: Secondary | ICD-10-CM | POA: Diagnosis not present

## 2021-11-25 DIAGNOSIS — M5441 Lumbago with sciatica, right side: Secondary | ICD-10-CM | POA: Diagnosis not present

## 2021-11-25 DIAGNOSIS — M6281 Muscle weakness (generalized): Secondary | ICD-10-CM | POA: Diagnosis not present

## 2021-11-25 DIAGNOSIS — M170 Bilateral primary osteoarthritis of knee: Secondary | ICD-10-CM | POA: Diagnosis not present

## 2021-11-25 DIAGNOSIS — R262 Difficulty in walking, not elsewhere classified: Secondary | ICD-10-CM

## 2021-11-25 DIAGNOSIS — M545 Low back pain, unspecified: Secondary | ICD-10-CM | POA: Diagnosis not present

## 2021-11-25 MED ORDER — HYALURONAN 30 MG/2ML IX SOSY
30.0000 mg | PREFILLED_SYRINGE | INTRA_ARTICULAR | Status: AC | PRN
  Administered 2021-11-25: 30 mg via INTRA_ARTICULAR

## 2021-11-25 NOTE — Progress Notes (Signed)
Office Visit Note   Patient: Rebecca [name]           Date of Birth: 03-19-1933           MRN: [id number] Visit Date: 11/25/2021              Requested by: Marin Olp, MD [place],  [place] [id number] PCP: Marin Olp, MD   Assessment & Plan: Visit Diagnoses:  1. Bilateral primary osteoarthritis of knee     Plan: 88 year old woman who comes in for her third bilateral Orthovisc injections.  We did delay 1 week because she had some pain in her right knee after the second injections.  She is doing better now.  She does think overall the injections have helped  Follow-Up Instructions: Return if symptoms worsen or fail to improve.   Orders:  Orders Placed This Encounter  Procedures   Large Joint Inj: bilateral knee   No orders of the defined types were placed in this encounter.     Procedures: Large Joint Inj: bilateral knee on 11/25/2021 2:31 PM Indications: pain and diagnostic evaluation Details: 25 G 1.5 in needle, anteromedial approach  Arthrogram: No  Medications (Right): 30 mg Hyaluronan 30 MG/2ML Medications (Left): 30 mg Hyaluronan 30 MG/2ML Outcome: tolerated well, no immediate complications Procedure, treatment alternatives, risks and benefits explained, specific risks discussed. Consent was given by the patient.      Clinical Data: No additional findings.   Subjective: Chief Complaint  Patient presents with   Right Knee - Follow-up   Left Knee - Follow-up   Patient presents today for follow up of her bilateral knee pain. She states that she still has been having continued right knee pain and would like to be evaluated for this. She states that she has completed orthovisc injection and would like to continue with the series if possible. Patient is currently ambulating with a cane at this time.   Review of Systems  All other systems reviewed and are negative.    Objective: Vital Signs: There were no vitals taken for  this visit.  Physical Exam Patient appears well sitting comfortably in a chair Ortho Exam Examination of bilateral knees no effusions no erythema no swelling.  No cellulitis Specialty Comments:  No specialty comments available.  Imaging: No results found.   PMFS History: Patient Active Problem List   Diagnosis Date Noted   CLL (chronic lymphocytic leukemia) ([place]) 09/30/2021   Cholangitis 03/26/2021   Cholelithiasis 03/25/2021   Elevated LFTs 03/25/2021   Chronic hyponatremia 03/25/2021   Bilateral primary osteoarthritis of knee 06/11/2019   Pain in left shoulder 05/22/2019   Unspecified vitamin D deficiency 12/25/2013   SPINAL STENOSIS, LUMBAR 03/09/2010   Osteoporosis 03/09/2010   MIGRAINE, OPHTHALMIC 10/29/2009   ADENOMATOUS COLONIC POLYP 06/10/2009   History of iron deficiency 05/06/2009   Carotid artery disease ([place]) 07/28/2008   Allergic rhinitis 03/20/2008   DYSPHAGIA 03/12/2008   Ganglion of tendon sheath 08/17/2007   Hypothyroidism 11/15/2006   Hyperlipidemia 11/07/2006   Essential hypertension 10/18/2006   GERD 10/18/2006   DIVERTICULOSIS, COLON 10/18/2006   Past Medical History:  Diagnosis Date   Anemia    Anxiety    Bifascicular block    Diverticulitis    Diverticulosis    Gastroparesis    GERD (gastroesophageal reflux disease)    Glaucoma    Hemorrhoids    Hyperlipidemia    Hypertension    Hypertension    IBS (irritable bowel  syndrome)    Rectal ulcer    SCOLIOSIS, LUMBAR SPINE 03/09/2010   Qualifier: Diagnosis of  By: Arnoldo Morale MD, John E    Segmental colitis [facility])    Tubular adenoma of colon 05/2009    Family History  Problem Relation Age of Onset   Diabetes Mother    Heart disease Mother    Hyperlipidemia Mother    Hypertension Mother    Anuerysm Father        abdominal- heavy smoker   Heart disease Father    Hypertension Father    Colon polyps Sister    Lung cancer Sister        smoker   Colon polyps Brother    Diabetes Brother     Hyperlipidemia Brother    Hypertension Brother    Heart disease Brother    Colon cancer Paternal Grandmother        with possible stomach cancer   Migraines Daughter    Kidney Stones Daughter    Colon polyps Daughter    Breast cancer Daughter    Cancer Daughter        salivary   Colon cancer Maternal Aunt        Rectal cancer   Crohn's disease Neg Hx    Pancreatic cancer Neg Hx     Past Surgical History:  Procedure Laterality Date   APPENDECTOMY     ARCUATE KERATECTOMY     BILIARY DILATION  03/27/2021   Procedure: BILIARY DILATION;  Surgeon: [name], MD;  Location: [facility];  Service: Gastroenterology;;   CATARACT EXTRACTION, BILATERAL  2015   CHOLECYSTECTOMY N/A 03/28/2021   Procedure: LAPAROSCOPIC CHOLECYSTECTOMY;  Surgeon: Leighton Ruff, MD;  Location: [facility];  Service: General;  Laterality: N/A;   double pallital tori bone removed      ERCP N/A 03/27/2021   Procedure: ENDOSCOPIC RETROGRADE CHOLANGIOPANCREATOGRAPHY (ERCP);  Surgeon: [name], MD;  Location: [facility];  Service: Gastroenterology;  Laterality: N/A;   HERNIA REPAIR     umbilical   KNEE SURGERY Bilateral    morton's neuroma on rt foot and platar facial release     MOUTH SURGERY     "bone shaved off roof of mouth"   REMOVAL OF STONES  03/27/2021   Procedure: REMOVAL OF STONES;  Surgeon: [name], MD;  Location: [facility];  Service: Gastroenterology;;   SALIVARY GLAND SURGERY     removal   SPHINCTEROTOMY  03/27/2021   Procedure: SPHINCTEROTOMY;  Surgeon: [name], MD;  Location: [facility];  Service: Gastroenterology;;   TONSILLECTOMY     Social History   Occupational History   Occupation: Glass blower/designer    Comment: retired  Tobacco Use   Smoking status: Never   Smokeless tobacco: Never  Vaping Use   Vaping Use: Never used  Substance and Sexual Activity   Alcohol use: Yes    Alcohol/week: 0.0 standard drinks of alcohol    Comment: rare   Drug use:  No   Sexual activity: Not Currently

## 2021-11-25 NOTE — Therapy (Signed)
OUTPATIENT PHYSICAL THERAPY LOWER EXTREMITY TREATMENT     PHYSICAL THERAPY DISCHARGE SUMMARY  Visits from Start of Care: 13  Current functional level related to goals / functional outcomes: Patient demonstrates improved strength and balance, continued pain.   Remaining deficits: Fluctuating back pain   Education / Equipment: HEP   Patient agrees to discharge. Patient goals were partially met. Patient is being discharged due to maximized rehab potential.    Patient Name: Rebecca [name] MRN: [id number] DOB:June 11, 1932, 88 y.o., female Today's Date: 11/25/2021   PT End of Session - 11/25/21 1254     Visit Number 13    Date for PT Re-Evaluation 11/25/21    PT Start Time 1145    PT Stop Time 1223    PT Time Calculation (min) 38 min    Activity Tolerance Patient tolerated treatment well    Behavior During Therapy [facility] for tasks assessed/performed               Past Medical History:  Diagnosis Date   Anemia    Anxiety    Bifascicular block    Diverticulitis    Diverticulosis    Gastroparesis    GERD (gastroesophageal reflux disease)    Glaucoma    Hemorrhoids    Hyperlipidemia    Hypertension    Hypertension    IBS (irritable bowel syndrome)    Rectal ulcer    SCOLIOSIS, LUMBAR SPINE 03/09/2010   Qualifier: Diagnosis of  By: Arnoldo Morale MD, John E    Segmental colitis [facility])    Tubular adenoma of colon 05/2009   Past Surgical History:  Procedure Laterality Date   APPENDECTOMY     ARCUATE KERATECTOMY     BILIARY DILATION  03/27/2021   Procedure: BILIARY DILATION;  Surgeon: [name], MD;  Location: [facility];  Service: Gastroenterology;;   CATARACT EXTRACTION, BILATERAL  2015   CHOLECYSTECTOMY N/A 03/28/2021   Procedure: LAPAROSCOPIC CHOLECYSTECTOMY;  Surgeon: Leighton Ruff, MD;  Location: [facility];  Service: General;  Laterality: N/A;   double pallital tori bone removed      ERCP N/A 03/27/2021   Procedure: ENDOSCOPIC RETROGRADE  CHOLANGIOPANCREATOGRAPHY (ERCP);  Surgeon: [name], MD;  Location: [facility];  Service: Gastroenterology;  Laterality: N/A;   HERNIA REPAIR     umbilical   KNEE SURGERY Bilateral    morton's neuroma on rt foot and platar facial release     MOUTH SURGERY     "bone shaved off roof of mouth"   REMOVAL OF STONES  03/27/2021   Procedure: REMOVAL OF STONES;  Surgeon: [name], MD;  Location: [facility];  Service: Gastroenterology;;   SALIVARY GLAND SURGERY     removal   SPHINCTEROTOMY  03/27/2021   Procedure: SPHINCTEROTOMY;  Surgeon: [name], MD;  Location: [facility];  Service: Gastroenterology;;   TONSILLECTOMY     Patient Active Problem List   Diagnosis Date Noted   CLL (chronic lymphocytic leukemia) ([place]) 09/30/2021   Cholangitis 03/26/2021   Cholelithiasis 03/25/2021   Elevated LFTs 03/25/2021   Chronic hyponatremia 03/25/2021   Bilateral primary osteoarthritis of knee 06/11/2019   Pain in left shoulder 05/22/2019   Unspecified vitamin D deficiency 12/25/2013   SPINAL STENOSIS, LUMBAR 03/09/2010   Osteoporosis 03/09/2010   MIGRAINE, OPHTHALMIC 10/29/2009   ADENOMATOUS COLONIC POLYP 06/10/2009   History of iron deficiency 05/06/2009   Carotid artery disease ([place]) 07/28/2008   Allergic rhinitis 03/20/2008   DYSPHAGIA 03/12/2008   Ganglion of tendon sheath 08/17/2007  Hypothyroidism 11/15/2006   Hyperlipidemia 11/07/2006   Essential hypertension 10/18/2006   GERD 10/18/2006   DIVERTICULOSIS, COLON 10/18/2006    PCP: [name], MD   REFERRING PROVIDER: [name], MD   REFERRING DIAG: R29.898 (ICD-10-CM) - Weakness of both lower extremities   THERAPY DIAG:  Chronic bilateral low back pain without sciatica  Difficulty in walking, not elsewhere classified  Muscle weakness (generalized)  Chronic bilateral low back pain with right-sided sciatica  Pain in left hip  Rationale for Evaluation and Treatment  Rehabilitation  ONSET DATE: 09/09/2021   SUBJECTIVE:   SUBJECTIVE STATEMENT: Patient reports some dizziness due to her ear.   PERTINENT HISTORY: L hip fracture. L4, L5 with nerve impingement on the R, per patient. 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 No active treatment.  PAIN:  Are you having pain? Yes: NPRS scale: 4/10 Pain location: R hip and foot Pain description: pain in hip, numbness in foot Aggravating factors: activity, bending Relieving factors: heating pad, avoid ben=ding  PRECAUTIONS: None  WEIGHT BEARING RESTRICTIONS No  FALLS:  Has patient fallen in last 6 months? No  LIVING ENVIRONMENT: Lives with: lives with their family and lives alone Lives in: House/apartment Stairs: Yes: External: 1 steps; none Has following equipment at home: Single point cane  OCCUPATION: retired  PLOF: Independent  PATIENT GOALS Walk straight, preferably without a cane   OBJECTIVE:   DIAGNOSTIC FINDINGS: N/A  COGNITION:  Overall cognitive status: Within functional limits for tasks assessed     SENSATION: Not tested  EDEMA: None   MUSCLE LENGTH: Hamstrings: Right Approx 75 degrees Thomas test: Slightly beyond neutral B.  POSTURE: forward head, decreased lumbar lordosis, decreased thoracic kyphosis, flexed trunk , and Scoliosis with rib hump on L, R paraspinal hump at lumbar region, R shoulder significantly lower than L  PALPATION: Tightness noted throughout paraspinals. TTP medial L scapular border, L gluts.  LOWER EXTREMITY ROM: BLE ROM generally WFL, but R hip mildly diminished and painful.      LOWER EXTREMITY MMT:  MMT Right eval Left eval  Hip flexion 3+ 4  Hip extension 3 4-  Hip abduction 3 4-  Hip adduction    Hip internal rotation    Hip external rotation    Knee flexion 3+ 4  Knee extension 4- 4  Ankle dorsiflexion 4 4  Ankle plantarflexion    Ankle inversion    Ankle eversion      (Blank rows = not tested)    FUNCTIONAL TESTS:  5 times sit to stand: 22.00 Timed up and go (TUG): 19.28  GAIT: Distance walked: 25' Assistive device utilized: Single point cane Level of assistance: Modified independence Comments: Small steps, shuffling, slow steps, patient reports she feels unsteady.   TODAY'S TREATMENT: 11/25/21 Functional status re-assessed for D/C HEP updated to include additional balance and strength exercises.  11/23/21 Recumbent bike L3 x 6 minutes Seated alternated arm and leg raise with upright posture. 10 reps each side. Sit to stand from elevated surface due to knee pain from lower surface, 10 reps Mini squats x 10 Seated clamshells against Red Tband resistance, 2 x 10 reps. Seated with B legs over red physioball. Stabilize through trunk while moving one foot to floor and lifting back onto the ball, stabilizing with other foot. 10 reps each foot. B side stepping up onto Airex pad and then down, 10 times to each side. Seated trunk stability. Hold ball against wall  with chin tuck while performing rows and shoulder ext against red Tband, 10 reps each. Seated lateral stretch, 2 x 5 reps to each side, emphasizing R trunk stretch.   11/19/21 Recumbent bike L2.5 x 6 minutes Seated rotation to R, place hands on mat and walk them out away from trunk and back. 5 x to each side. Lan to R on elbow. Elevate LLE up and out to the side, touch top ov mat with heel. 2 x 5 to each side. Supine with legs over physioball-bridge, bridge with IR, B knees to chest, B KTC in diagonals, lower trunk rotation, 10 reps Standing with back to wall, chin tuck to hold ball against wall while performing horizontal abd against red tband resistance. Side flexion to L keeping back against wall. Heel raises on 4" step, 2 x 10 reps Low back stretch over large Blue physioball, 5 x 5 sec hold straight, L , and R  11/16/21 Supine SLR, 1# on R, 2 x 10 each leg, SLR with ER 2 x 10 reps each  leg TUG 10.20, 19 sec Seated postural strength and control- hold ball against wall with posterior head while performing rows and sh horizontal abd, 2 x 10 each Lateral trunk stretch for R side, seated, reach L hand to floor with lateral flexion, raising R arm overhead to increase stretch.  11/11/21 Recumbent bike L3 x 6 minutes. Step ups onto 4" step with BUE support 10 reps each Side step ups onto 4" step, UUE support Crossover step ups onto 4" step, 10 reps each, UUE support Quick steps in all directions, changing upon therapist command 2 minutes, B HHA 4 square stepping for speed in both directions-30 sec each SLS- alternately tapping each foot on cones in semicircle in front. She had much more difficulty standing on LLE due to hip weakness.  11/04/21 NuStep L5 x 6 minutes Supine DKTC on physioball for stretch 10 x 3 sec hold Lower trunk rotation on physioball 5x to each side Piriformis stretch over B B x 30 sec B bridge over ball x 10 reps B bridge over ball, rotating hips slightly to each side x 5 reps. Alternate SLR off ball while stabilizing with the opposite leg x 10 reps. Sit to stand x 5 reps.  11/01/21 Recumbent cycling L1 x 4 minutes Sit to stand from elevated mat while holding 1# ball in BUE 1 x 10 Side to side step with red Tband resistance at knees, x 10 reps, HHA. Sid e to side quick steps Forward and back quick steps Quick steps changing directions upon command of therapist- All performed with B HHA. Step ups on 4" step with B UE support on rails 10 reps each leg Lateral step ups on 4" step, 10 each leg.  10/28/21 NuStep L3 x 4 minutes, then 2 x 20 seconds at L5 Sit on physiodisc- ant/post, lat lower body weight shifts with improved control noted. Static sitting on disc while reaching up and then into diagonals with BUE holding 2# ball 10 reps each. Side to side stepping with red T band resistance around knees, 10 reps to each side, B HHA 4 square stepping 5 times in  each direction iwht straight cane and CGA, mild unsteadiness. Walked 100' with straight cane and MI demosntrating improved upright posture after treatment.   10/07/21 Sit <> stand x 10 reps, no hands,. Seated trunk mobilization- Large physioball at her back, practiced ant/post lower body weight shifts with ball and therapist providing tactile cues. Seated  rotational weight shifts with hands placed on mat to her R. Weight shifts forward and back into diagonal x 6 reps with therapist facilitating lower trunk weight shifts in diagonal. Returned to seated lower trunk mobilizations with improved excursion, placed hands behind hips and added upper body ext 6 x 5 sec Supine chin tucks to elongate posterior neck, 8 x 10 sec. Step ups on 4" step with BUE support x 10, then side step ups with UUE support x 10 each.    10/06/21 Nu-Step L3 x 6 mins Sitting LE stretching- HS, figure 4, ITB. Patient had great difficulty achieving a stretch. Facilitated lower trunk mobilizations-ant/post, and lateral. Again, great diffiuclty, required max TC and VC on physiodisc. Ambulated in assessment with cane in R hand, L hand and holding cane up with BUE. She maintains L lateral trunk flexion, and forward flex, slightly improved while holding the cane. Her trunk is very immobile in any direction.  09/29/21 Nustep L3 x62mns Supine K2C 30s HS stretch 30s  Piriformis stretch 30s  Feet on ball trunk rotations 136m SLR x10, 2#x10 LAQ 2# 2x10 2 way hip 2# 2x10    STS 2x10 Step ups 4" w/1HHA   09/22/21 Nu-Step L3 x 6 minutes Supine stretches SKTC, Active HS, figure 4, ITB, 30 sec each side. Supine strength- bridging, SLR, SLR with ER, 10 each Standing hip flexor stretch facing wall, mini squats x 10, rows, sh ext, sh ER with Red Tband  09/16/21 Patient education   PATIENT EDUCATION:  Education details: HEP Person educated: Patient Education method: ExConsulting civil engineerdeSystems developerhandout Education comprehension: verbalized  understanding, return demo   HOME EXERCISE PROGRAM: [id number]:  CLINICAL IMPRESSION: Patient reports L ear pain continues. She will see the Dr on Friday. She feels some dizziness due to the ear feeling clogged. She has progressed toward all LTG. She still feels unsteady occasionally. Encouraged to request a new consult for balance assessment if she is still feeling dizzy after getting her ear addressed.   OBJECTIVE IMPAIRMENTS Abnormal gait, decreased activity tolerance, decreased balance, decreased coordination, decreased mobility, difficulty walking, decreased ROM, decreased strength, impaired flexibility, improper body mechanics, postural dysfunction, and pain.   ACTIVITY LIMITATIONS carrying, lifting, bending, standing, squatting, sleeping, stairs, reach over head, and locomotion level  PARTICIPATION LIMITATIONS: meal prep, cleaning, laundry, shopping, and yard work  PERSONAL FACTORS Age, Past/current experiences, and Time since onset of injury/illness/exacerbation are also affecting patient's functional outcome.   REHAB POTENTIAL: Good  CLINICAL DECISION MAKING: Stable/uncomplicated  EVALUATION COMPLEXITY: Low   GOALS: Goals reviewed with patient? Yes  SHORT TERM GOALS: Target date: 10/14/2021  I with basic HEP Baseline: initiated Goal status: met  LONG TERM GOALS: Target date: 11/25/2021   I with final HEP Baseline:  Goal status: met  2.  Perform TUG in < 12 seconds to demonstrate improved balance Baseline: 19.00, 11/25/21 14.12 Goal status: partially met  3.  Perform 5 x STS in < 12 seconds to demonstrate improved BLE strength Baseline: 22.00, 8/24-18.39 Goal status: partially met  4.  Patient will ambulate x at least 500' using LRAD, MI, on level and unlevel surfaces, demonstrating normalized gait pattern Baseline: Small steps, shuffling, slow steps, patient reports she feels unsteady. 8/24-Patient walked 500' with straight cane, MI on indoor  surfaces. Much improved stability, no shuffling, increased speed. Goal status: met  5.  Patient will report LBP of <3/10 at any point during the day or evening. Baseline: Up to 5/10, 8/24-4/10 Goal status: partially met  PLAN: PT FREQUENCY: 2x/week  PT DURATION: 10 weeks  PLANNED INTERVENTIONS: Therapeutic exercises, Therapeutic activity, Neuromuscular re-education, Balance training, Gait training, Patient/Family education, Joint mobilization, Dry Needling, Electrical stimulation, Cryotherapy, Moist heat, Traction, Ionotophoresis 77m/ml Dexamethasone, and Manual therapy  PLAN FOR NEXT SESSION:  [facility]   SMarcelina Morel DPT 11/25/2021, 12:55 PM

## 2021-11-26 DIAGNOSIS — H938X3 Other specified disorders of ear, bilateral: Secondary | ICD-10-CM | POA: Diagnosis not present

## 2021-11-30 ENCOUNTER — Telehealth: Payer: Self-pay | Admitting: Orthopaedic Surgery

## 2021-11-30 NOTE — Telephone Encounter (Signed)
Pt called requesting a handicap placard. Please call pt when ready for pick up. Pt phone number is [phone].

## 2021-12-01 NOTE — Telephone Encounter (Signed)
Form completed and placed at the front desk

## 2021-12-01 NOTE — Telephone Encounter (Signed)
Pt was called and informed and stated understanding

## 2021-12-13 ENCOUNTER — Other Ambulatory Visit: Payer: Self-pay | Admitting: *Deleted

## 2021-12-13 DIAGNOSIS — L900 Lichen sclerosus et atrophicus: Secondary | ICD-10-CM | POA: Diagnosis not present

## 2021-12-13 DIAGNOSIS — Z01411 Encounter for gynecological examination (general) (routine) with abnormal findings: Secondary | ICD-10-CM | POA: Diagnosis not present

## 2021-12-13 DIAGNOSIS — Z124 Encounter for screening for malignant neoplasm of cervix: Secondary | ICD-10-CM | POA: Diagnosis not present

## 2021-12-13 DIAGNOSIS — N816 Rectocele: Secondary | ICD-10-CM | POA: Diagnosis not present

## 2021-12-13 DIAGNOSIS — C9110 Chronic lymphocytic leukemia of B-cell type not having achieved remission: Secondary | ICD-10-CM

## 2021-12-13 DIAGNOSIS — Z6822 Body mass index (BMI) 22.0-22.9, adult: Secondary | ICD-10-CM | POA: Diagnosis not present

## 2021-12-13 DIAGNOSIS — N952 Postmenopausal atrophic vaginitis: Secondary | ICD-10-CM | POA: Diagnosis not present

## 2021-12-13 DIAGNOSIS — Z01419 Encounter for gynecological examination (general) (routine) without abnormal findings: Secondary | ICD-10-CM | POA: Diagnosis not present

## 2021-12-13 DIAGNOSIS — Z4689 Encounter for fitting and adjustment of other specified devices: Secondary | ICD-10-CM | POA: Diagnosis not present

## 2021-12-13 DIAGNOSIS — M810 Age-related osteoporosis without current pathological fracture: Secondary | ICD-10-CM | POA: Diagnosis not present

## 2021-12-13 DIAGNOSIS — N8110 Cystocele, unspecified: Secondary | ICD-10-CM | POA: Diagnosis not present

## 2021-12-13 DIAGNOSIS — Z0142 Encounter for cervical smear to confirm findings of recent normal smear following initial abnormal smear: Secondary | ICD-10-CM | POA: Diagnosis not present

## 2021-12-14 ENCOUNTER — Encounter: Payer: Self-pay | Admitting: Physician Assistant

## 2021-12-14 ENCOUNTER — Ambulatory Visit (INDEPENDENT_AMBULATORY_CARE_PROVIDER_SITE_OTHER): Payer: Medicare Other | Admitting: Physician Assistant

## 2021-12-14 ENCOUNTER — Inpatient Hospital Stay: Payer: Medicare Other | Attending: Hematology | Admitting: Hematology

## 2021-12-14 ENCOUNTER — Ambulatory Visit (INDEPENDENT_AMBULATORY_CARE_PROVIDER_SITE_OTHER): Payer: Medicare Other

## 2021-12-14 ENCOUNTER — Inpatient Hospital Stay: Payer: Medicare Other

## 2021-12-14 ENCOUNTER — Other Ambulatory Visit: Payer: Self-pay

## 2021-12-14 VITALS — BP 171/78 | HR 50 | Temp 97.7°F | Resp 16 | Ht 62.0 in | Wt 123.1 lb

## 2021-12-14 DIAGNOSIS — E039 Hypothyroidism, unspecified: Secondary | ICD-10-CM | POA: Insufficient documentation

## 2021-12-14 DIAGNOSIS — I10 Essential (primary) hypertension: Secondary | ICD-10-CM | POA: Diagnosis not present

## 2021-12-14 DIAGNOSIS — K219 Gastro-esophageal reflux disease without esophagitis: Secondary | ICD-10-CM | POA: Diagnosis not present

## 2021-12-14 DIAGNOSIS — M25561 Pain in right knee: Secondary | ICD-10-CM

## 2021-12-14 DIAGNOSIS — C9110 Chronic lymphocytic leukemia of B-cell type not having achieved remission: Secondary | ICD-10-CM | POA: Diagnosis not present

## 2021-12-14 LAB — CBC WITH DIFFERENTIAL (CANCER CENTER ONLY)
Abs Immature Granulocytes: 0.2 10*3/uL — ABNORMAL HIGH (ref 0.00–0.07)
Basophils Absolute: 0 10*3/uL (ref 0.0–0.1)
Basophils Relative: 0 %
Eosinophils Absolute: 0 10*3/uL (ref 0.0–0.5)
Eosinophils Relative: 0 %
HCT: 35.6 % — ABNORMAL LOW (ref 36.0–46.0)
Hemoglobin: 12.2 g/dL (ref 12.0–15.0)
Lymphocytes Relative: 67 %
Lymphs Abs: 12.1 10*3/uL — ABNORMAL HIGH (ref 0.7–4.0)
MCH: 34.2 pg — ABNORMAL HIGH (ref 26.0–34.0)
MCHC: 34.3 g/dL (ref 30.0–36.0)
MCV: 99.7 fL (ref 80.0–100.0)
Monocytes Absolute: 0.7 10*3/uL (ref 0.1–1.0)
Monocytes Relative: 4 %
Myelocytes: 1 %
Neutro Abs: 5.1 10*3/uL (ref 1.7–7.7)
Neutrophils Relative %: 28 %
Platelet Count: 136 10*3/uL — ABNORMAL LOW (ref 150–400)
RBC: 3.57 MIL/uL — ABNORMAL LOW (ref 3.87–5.11)
RDW: 12.4 % (ref 11.5–15.5)
Smear Review: NORMAL
WBC Count: 18.1 10*3/uL — ABNORMAL HIGH (ref 4.0–10.5)
nRBC: 0.1 % (ref 0.0–0.2)

## 2021-12-14 LAB — CMP (CANCER CENTER ONLY)
ALT: 11 U/L (ref 0–44)
AST: 19 U/L (ref 15–41)
Albumin: 4.1 g/dL (ref 3.5–5.0)
Alkaline Phosphatase: 77 U/L (ref 38–126)
Anion gap: 4 — ABNORMAL LOW (ref 5–15)
BUN: 16 mg/dL (ref 8–23)
CO2: 27 mmol/L (ref 22–32)
Calcium: 9.9 mg/dL (ref 8.9–10.3)
Chloride: 99 mmol/L (ref 98–111)
Creatinine: 1.12 mg/dL — ABNORMAL HIGH (ref 0.44–1.00)
GFR, Estimated: 48 mL/min — ABNORMAL LOW (ref >60)
Glucose, Bld: 111 mg/dL — ABNORMAL HIGH (ref 70–99)
Potassium: 4.6 mmol/L (ref 3.5–5.1)
Sodium: 130 mmol/L — ABNORMAL LOW (ref 135–145)
Total Bilirubin: 0.5 mg/dL (ref 0.3–1.2)
Total Protein: 6.2 g/dL — ABNORMAL LOW (ref 6.5–8.1)

## 2021-12-14 LAB — LACTATE DEHYDROGENASE: LDH: 126 U/L (ref 98–192)

## 2021-12-14 NOTE — Progress Notes (Signed)
Office Visit Note   Patient: Rebecca [name]           Date of Birth: 09/06/1932           MRN: [id number] Visit Date: 12/14/2021              Requested by: Rebecca [name], Rebecca [name],  Rebecca [name] [id number] PCP: Rebecca Olp, MD  Chief Complaint  Patient presents with   Right [name] - Pain      HPI: [name] is a pleasant 88 year old patient of Dr. Rudene Anda.  She has a history of tricompartmental arthritis of both of her knees.  She recently underwent viscosupplementation in both of her knees.  Last injection was 824.  She was doing fairly well.  Unfortunately a couple weeks ago she tripped on a rug and fell onto the front of her right [name].  She did not report any significant swelling or bruising.  It is still sore today and she just wants to make sure she did not do anything to her [name].  Assessment & Plan: Visit Diagnoses:  1. Acute pain of right [name]     Plan: Exam and x-rays are reassuring.  I suspect she just has some inflammation of her arthritic symptoms.  Most of her pain was at the superior pole of the patella.  She does have good strength and is able to fire her quadriceps and hold her leg in a straight leg raise.  We discussed using Voltaren gel which she has used in the past.  Also alternating heat and ice may be helpful to her.  She will make [name] appointment to follow-up with Dr. Durward Fortes in a couple weeks.  She may cancel this appointment if she is doing better.  If she still has pain may be reasonable to give her a steroid injection.  Follow-Up Instructions: 2 weeks  Ortho Exam  Patient is alert, oriented, no adenopathy, well-dressed, normal affect, normal respiratory effort. Right [name] no effusion no ecchymosis no deformity.  She does have some slight tenderness at the superior pole of the patella.  She is able to hold her leg in a straight leg raise.  Good varus valgus stability.  No tenderness that is increased over the medial  lateral joint line.  She does have crepitus which is baseline with range of motion.  Compartments of the lower  Imaging: XR [name] 3 VIEW RIGHT  Result Date: 12/14/2021 Three-view radiographs of her right [name] were reviewed today and compared to those 5 months ago.  She has tricompartmental arthritis with loss of joint space periarticular osteophytes.  She has calcification within the quadricep tendon.  No acute fractures.  Films are very similar to those taken 5 months ago.  No images are attached to the encounter.  Labs: Lab Results  Component Value Date   HGBA1C 5.6 07/26/2006   ESRSEDRATE 22 02/10/2021   REPTSTATUS 03/04/2018 FINAL 03/03/2018   CULT  03/03/2018    NO GROWTH Performed at [facility], [place] [address] [place], [place] [address]    LABORGA NO GROWTH 06/10/2016     Lab Results  Component Value Date   ALBUMIN 4.1 12/14/2021   ALBUMIN 4.2 08/13/2021   ALBUMIN 4.2 05/05/2021    Lab Results  Component Value Date   MG 1.8 03/28/2021   Lab Results  Component Value Date   VD25OH 45 03/20/2020   VD25OH 47.49 01/01/2016   VD25OH 41.39 12/25/2013  No results found for: "PREALBUMIN"    Latest Ref Rng & Units 12/14/2021   12:33 PM 08/13/2021   10:36 AM 05/05/2021   12:38 PM  CBC EXTENDED  WBC 4.0 - 10.5 K/uL 18.1  19.7  22.1   RBC 3.87 - 5.11 MIL/uL 3.57  3.65  3.56   Hemoglobin 12.0 - 15.0 g/dL 12.2  12.3  11.9   HCT 36.0 - 46.0 % 35.6  36.2  35.8   Platelets 150 - 400 K/uL 136  155  141   NEUT# 1.7 - 7.7 K/uL 5.1  3.0  3.4   Lymph# 0.7 - 4.0 K/uL 12.1  14.3  15.8      There is no height or weight on file to calculate BMI.  Orders:  Orders Placed This Encounter  Procedures   XR [name] 3 VIEW RIGHT   No orders of the defined types were placed in this encounter.    Procedures: No procedures performed  Clinical Data: No additional findings.  ROS:  All other systems negative, except as noted in the HPI. Review of  Systems  Objective: Vital Signs: There were no vitals taken for this visit.  Specialty Comments:  No specialty comments available.  PMFS History: Patient Active Problem List   Diagnosis Date Noted   CLL (chronic lymphocytic leukemia) ([place]) 09/30/2021   Cholangitis 03/26/2021   Cholelithiasis 03/25/2021   Elevated LFTs 03/25/2021   Chronic hyponatremia 03/25/2021   Bilateral primary osteoarthritis of [name] 06/11/2019   Pain in left shoulder 05/22/2019   Unspecified vitamin D deficiency 12/25/2013   SPINAL STENOSIS, LUMBAR 03/09/2010   Osteoporosis 03/09/2010   MIGRAINE, OPHTHALMIC 10/29/2009   ADENOMATOUS COLONIC POLYP 06/10/2009   History of iron deficiency 05/06/2009   Carotid artery disease ([place]) 07/28/2008   Allergic rhinitis 03/20/2008   DYSPHAGIA 03/12/2008   Ganglion of tendon sheath 08/17/2007   Hypothyroidism 11/15/2006   Hyperlipidemia 11/07/2006   Essential hypertension 10/18/2006   GERD 10/18/2006   DIVERTICULOSIS, COLON 10/18/2006   Past Medical History:  Diagnosis Date   Anemia    Anxiety    Bifascicular block    Diverticulitis    Diverticulosis    Gastroparesis    GERD (gastroesophageal reflux disease)    Glaucoma    Hemorrhoids    Hyperlipidemia    Hypertension    Hypertension    IBS (irritable bowel syndrome)    Rectal ulcer    SCOLIOSIS, LUMBAR SPINE 03/09/2010   Qualifier: Diagnosis of  By: Arnoldo Morale MD, John E    Segmental colitis ([place])    Tubular adenoma of colon 05/2009    Family History  Problem Relation Age of Onset   Diabetes Mother    Heart disease Mother    Hyperlipidemia Mother    Hypertension Mother    Anuerysm Father        abdominal- heavy smoker   Heart disease Father    Hypertension Father    Colon polyps Sister    Lung cancer Sister        smoker   Colon polyps Brother    Diabetes Brother    Hyperlipidemia Brother    Hypertension Brother    Heart disease Brother    Colon cancer Paternal Grandmother        with  possible stomach cancer   Migraines Daughter    Kidney Stones Daughter    Colon polyps Daughter    Breast cancer Daughter    Cancer Daughter  salivary   Colon cancer Maternal Aunt        Rectal cancer   Crohn's disease Neg Hx    Pancreatic cancer Neg Hx     Past Surgical History:  Procedure Laterality Date   APPENDECTOMY     ARCUATE KERATECTOMY     BILIARY DILATION  03/27/2021   Procedure: BILIARY DILATION;  Surgeon: [name], MD;  Location: [facility];  Service: Gastroenterology;;   CATARACT EXTRACTION, BILATERAL  2015   CHOLECYSTECTOMY N/A 03/28/2021   Procedure: LAPAROSCOPIC CHOLECYSTECTOMY;  Surgeon: Leighton Ruff, MD;  Location: [facility];  Service: General;  Laterality: N/A;   double pallital tori bone removed      ERCP N/A 03/27/2021   Procedure: ENDOSCOPIC RETROGRADE CHOLANGIOPANCREATOGRAPHY (ERCP);  Surgeon: [name], MD;  Location: [facility];  Service: Gastroenterology;  Laterality: N/A;   HERNIA REPAIR     umbilical   [name] SURGERY Bilateral    morton's neuroma on rt foot and platar facial release     MOUTH SURGERY     "bone shaved off roof of mouth"   REMOVAL OF STONES  03/27/2021   Procedure: REMOVAL OF STONES;  Surgeon: [name], MD;  Location: [facility];  Service: Gastroenterology;;   SALIVARY GLAND SURGERY     removal   SPHINCTEROTOMY  03/27/2021   Procedure: SPHINCTEROTOMY;  Surgeon: [name], MD;  Location: [facility];  Service: Gastroenterology;;   TONSILLECTOMY     Social History   Occupational History   Occupation: Glass blower/designer    Comment: retired  Tobacco Use   Smoking status: Never   Smokeless tobacco: Never  Vaping Use   Vaping Use: Never used  Substance and Sexual Activity   Alcohol use: Yes    Alcohol/week: 0.0 standard drinks of alcohol    Comment: rare   Drug use: No   Sexual activity: Not Currently

## 2021-12-14 NOTE — Progress Notes (Signed)
[name] Kitchen   HEMATOLOGY/ONCOLOGY CLINIC NOTE  Date of Service: 12/14/2021  Patient Care Team: Marin Olp, MD as PCP - General (Family Medicine) Ladene Artist, MD as Consulting Physician (Gastroenterology) Aloha Gell, MD as Consulting Physician (Obstetrics and Gynecology) Luberta Mutter, MD as Consulting Physician (Ophthalmology) [name], [facility] as Pharmacist (Pharmacist)  CHIEF COMPLAINTS/PURPOSE OF CONSULTATION:  Follow-up for continued evaluation and management of CLL  HISTORY OF PRESENTING ILLNESS:  Rebecca [name] is a wonderful 88 y.o. female who has been referred to [place] by Dr Leighton Ruff MD for evaluation and management of lymphocytosis.  Patient has a history of hypertension, GERD, hypothyroidism was noted to have biliary colic in November 8563 and referred for surgical evaluation due to persistent and worsening symptoms.  In the emergency room the patient was noted to have elevated LFTs and significant leukocytosis of 27.4k.  CT scan showed cholelithiasis, ultrasound abdomen showed cholelithiasis without acute cholecystitis.  MRCP showed distended CBD with choledocholithiasis.  Patient subsequently had ERCP on 12/24 with biliary sphincterotomy and subsequently had her laparoscopic cholecystectomy on 03/28/2021.  Surgical pathology from her cholecystectomy showed chronic cholecystitis without cholelithiasis. 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 Labs ordered 04/09/2021 showed persistent leukocytosis of 19.2k with lymphocyte count of 15.2k hemoglobin of 11.1 and platelets of 219k.  Patient was referred to [place] for further evaluation of CD5 positive lymphoproliferative disorder.  Patient notes no fevers no chills no night sweats. No new lumps or bumps. CT abdomen 04/15/2020 showed enlarged abdominal pelvic lymph nodes.  INTERVAL HISTORY  Rebecca [name] is a 88 y.o. female is here for continued evaluation and  management of her chronic lymphocytic leukemia. She reports She is doing well with no new symptoms or concerns.  We discussed her developing hyponatremia and that she should try to increase her sodium intake. We also discussed that her low sodium levels might related to her thyroid and we discussed getting her thyroid labs with her PCP.  We discussed staying up to date with age appropriate vaccinations along with the RSV vaccine.  No fever, chills, night sweats. No new lumps, bumps, or lesions/rashes. No new or unexpected weight loss. No other new or acute focal symptoms.  Labs done today were reviewed in detail.  MEDICAL HISTORY:  Past Medical History:  Diagnosis Date   Anemia    Anxiety    Bifascicular block    Diverticulitis    Diverticulosis    Gastroparesis    GERD (gastroesophageal reflux disease)    Glaucoma    Hemorrhoids    Hyperlipidemia    Hypertension    Hypertension    IBS (irritable bowel syndrome)    Rectal ulcer    SCOLIOSIS, LUMBAR SPINE 03/09/2010   Qualifier: Diagnosis of  By: Arnoldo Morale MD, John E    Segmental colitis [facility])    Tubular adenoma of colon 05/2009    SURGICAL HISTORY: Past Surgical History:  Procedure Laterality Date   APPENDECTOMY     ARCUATE KERATECTOMY     BILIARY DILATION  03/27/2021   Procedure: BILIARY DILATION;  Surgeon: [name], MD;  Location: [facility];  Service: Gastroenterology;;   CATARACT EXTRACTION, BILATERAL  2015   CHOLECYSTECTOMY N/A 03/28/2021   Procedure: LAPAROSCOPIC CHOLECYSTECTOMY;  Surgeon: Leighton Ruff, MD;  Location: [facility];  Service: General;  Laterality: N/A;   double pallital tori bone removed      ERCP N/A 03/27/2021   Procedure: ENDOSCOPIC RETROGRADE CHOLANGIOPANCREATOGRAPHY (  ERCP);  Surgeon: [name], MD;  Location: [facility];  Service: Gastroenterology;  Laterality: N/A;   HERNIA REPAIR     umbilical   KNEE SURGERY Bilateral    morton's neuroma on rt foot and platar facial release      MOUTH SURGERY     "bone shaved off roof of mouth"   REMOVAL OF STONES  03/27/2021   Procedure: REMOVAL OF STONES;  Surgeon: [name], MD;  Location: [facility];  Service: Gastroenterology;;   SALIVARY GLAND SURGERY     removal   SPHINCTEROTOMY  03/27/2021   Procedure: SPHINCTEROTOMY;  Surgeon: [name], MD;  Location: [facility];  Service: Gastroenterology;;   TONSILLECTOMY      SOCIAL HISTORY: Social History   Socioeconomic History   Marital status: Widowed    Spouse name: Not on file   Number of children: 3   Years of education: Not on file   Highest education level: Not on file  Occupational History   Occupation: Glass blower/designer    Comment: retired  Tobacco Use   Smoking status: Never   Smokeless tobacco: Never  Vaping Use   Vaping Use: Never used  Substance and Sexual Activity   Alcohol use: Yes    Alcohol/week: 0.0 standard drinks of alcohol    Comment: rare   Drug use: No   Sexual activity: Not Currently  Other Topics Concern   Not on file  Social History Narrative   Widowed. 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      Hobbies: Enjoys reading, especially mysteries, Walks for exercise   Attends church                  Social Determinants of Health   Financial Resource Strain: [place]  (03/22/2020)   Overall Financial Resource Strain (CARDIA)    Difficulty of Paying Living Expenses: Not hard at all  Food Insecurity: No Food Insecurity (11/16/2021)   Hunger Vital Sign    Worried About Running Out of Food in the Last Year: Never true    [place] in the Last Year: Never true  Transportation Needs: No Transportation Needs (11/16/2021)   PRAPARE - Hydrologist (Medical): No    Lack of Transportation (Non-Medical): No  Physical Activity: Insufficiently Active (11/19/2019)   Exercise Vital  Sign    Days of Exercise per Week: 7 days    Minutes of Exercise per Session: 20 min  Stress: No Stress Concern Present (11/19/2019)   [place]    Feeling of Stress : Not at all  Social Connections: Moderately Isolated (11/19/2019)   Social Connection and Isolation Panel [NHANES]    Frequency of Communication with Friends and Family: More than three times a week    Frequency of Social Gatherings with Friends and Family: More than three times a week    Attends Religious Services: More than 4 times per year    Active Member of [employer] or Organizations: No    Attends Archivist Meetings: Never    Marital Status: Widowed  Intimate Partner Violence: Not At Risk (11/19/2019)   Humiliation, Afraid, Rape, and Kick questionnaire    Fear of Current or Ex-Partner: No    Emotionally Abused: No    Physically Abused: No    Sexually  Abused: No    FAMILY HISTORY: Family History  Problem Relation Age of Onset   Diabetes Mother    Heart disease Mother    Hyperlipidemia Mother    Hypertension Mother    Anuerysm Father        abdominal- heavy smoker   Heart disease Father    Hypertension Father    Colon polyps Sister    Lung cancer Sister        smoker   Colon polyps Brother    Diabetes Brother    Hyperlipidemia Brother    Hypertension Brother    Heart disease Brother    Colon cancer Paternal Grandmother        with possible stomach cancer   Migraines Daughter    Kidney Stones Daughter    Colon polyps Daughter    Breast cancer Daughter    Cancer Daughter        salivary   Colon cancer Maternal Aunt        Rectal cancer   Crohn's disease Neg Hx    Pancreatic cancer Neg Hx     ALLERGIES:  is allergic to levaquin [levofloxacin], sulfamethoxazole-trimethoprim, lactose intolerance (gi), tramadol hcl, augmentin [amoxicillin-pot clavulanate], ciprofloxacin, pneumococcal vaccine polyvalent, zosyn [piperacillin  sod-tazobactam so], adhesive [tape], and codeine.  MEDICATIONS:  Current Outpatient Medications  Medication Sig Dispense Refill   ALPRAZolam (XANAX) 0.25 MG tablet TAKE 1 TABLET(0.25 MG) BY MOUTH AT BEDTIME AS NEEDED. Do not drive for 8 hours after taking. Stop taking if you have any falls until next visit with physician 20 tablet 0   Calcium Carb-Cholecalciferol (CALCIUM-VITAMIN D) 600-400 MG-UNIT TABS Take 1 tablet by mouth daily.     Cholecalciferol (VITAMIN D3) 2000 UNITS TABS Take 1 tablet by mouth daily.     diclofenac sodium (VOLTAREN) 1 % GEL Apply 2 g topically 4 (four) times daily. 1 Tube 1   esomeprazole (NEXIUM) 20 MG capsule Take 20 mg by mouth 2 (two) times daily before a meal.     latanoprost (XALATAN) 0.005 % ophthalmic solution Place 1 drop into both eyes at bedtime.     levothyroxine (SYNTHROID) 88 MCG tablet Take 1 tablet (88 mcg total) by mouth daily before breakfast. 90 tablet 3   loperamide (IMODIUM) 2 MG capsule Take 2 capsules (4 mg total) by mouth every 6 (six) hours as needed for diarrhea or loose stools. 30 capsule 0   losartan (COZAAR) 100 MG tablet Take 1 tablet (100 mg total) by mouth daily. 90 tablet 3   metoCLOPramide (REGLAN) 5 MG tablet TAKE 1 TABLET BY MOUTH TWICE DAILY WITH MEALS (Patient taking differently: Take 5 mg by mouth daily. TAKE 1 TABLET BY MOUTH TWICE DAILY WITH MEALS) 60 tablet 3   timolol (TIMOPTIC) 0.5 % ophthalmic solution Place 1 drop into both eyes 2 (two) times daily.     vitamin B-12 (CYANOCOBALAMIN) 500 MCG tablet Take 500 mcg by mouth every morning.     vitamin C (ASCORBIC ACID) 500 MG tablet Take 500 mg by mouth daily.     estradiol (ESTRACE) 0.1 MG/GM vaginal cream Place vaginally.     rosuvastatin (CRESTOR) 20 MG tablet TAKE 1 TABLET(20 MG) BY MOUTH DAILY (Patient not taking: Reported on 12/14/2021) 30 tablet 5   No current facility-administered medications for this visit.    REVIEW OF SYSTEMS:   10 Point review of Systems was done  is negative except as noted above.  PHYSICAL EXAMINATION:  .BP (!) 171/78 (BP Location: Left Arm, Patient  Position: Sitting) Comment: nurse notified  Pulse (!) 50 Comment: nurse notified  Temp 97.7 F (36.5 C) (Temporal)   Resp 16   Ht 5' 2"  (1.575 m)   Wt 123 lb 1.6 oz (55.8 kg)   SpO2 100%   BMI 22.52 kg/m  NAD GENERAL:alert, in no acute distress and comfortable SKIN: no acute rashes, no significant lesions EYES: conjunctiva are pink and non-injected, sclera anicteric NECK: supple, no JVD LYMPH:  no palpable lymphadenopathy in the cervical, axillary or inguinal regions LUNGS: clear to auscultation b/l with normal respiratory effort HEART: regular rate & rhythm ABDOMEN:  normoactive bowel sounds , non tender, not distended. Extremity: no pedal edema PSYCH: alert & oriented x 3 with fluent speech NEURO: no focal motor/sensory deficits  LABORATORY DATA:  I have reviewed the data as listed     Latest Ref Rng & Units 12/14/2021   12:33 PM 08/13/2021   10:36 AM 05/05/2021   12:38 PM  CBC  WBC 4.0 - 10.5 K/uL 18.1  19.7  22.1   Hemoglobin 12.0 - 15.0 g/dL 12.2  12.3  11.9   Hematocrit 36.0 - 46.0 % 35.6  36.2  35.8   Platelets 150 - 400 K/uL 136  155  141       Latest Ref Rng & Units 12/14/2021   12:33 PM 08/13/2021   10:36 AM 05/05/2021   12:38 PM  CMP  Glucose 70 - 99 mg/dL 111  97  109   BUN 8 - 23 mg/dL 16  14  13    Creatinine 0.44 - 1.00 mg/dL 1.12  0.91  0.81   Sodium 135 - 145 mmol/L 130  131  131   Potassium 3.5 - 5.1 mmol/L 4.6  4.4  4.4   Chloride 98 - 111 mmol/L 99  98  99   CO2 22 - 32 mmol/L 27  27  26    Calcium 8.9 - 10.3 mg/dL 9.9  9.9  10.1   Total Protein 6.5 - 8.1 g/dL 6.2  6.4  6.3   Total Bilirubin 0.3 - 1.2 mg/dL 0.5  0.6  0.6   Alkaline Phos 38 - 126 U/L 77  72  88   AST 15 - 41 U/L 19  21  22    ALT 0 - 44 U/L 11  13  18     Component     Latest Ref Rng & Units 05/05/2021  Hep B Core Total Ab     NON REACTIVE NON REACTIVE  Hepatitis B Surface  Ag     NON REACTIVE NON REACTIVE  HCV Ab     NON REACTIVE NON REACTIVE  LDH     98 - 192 U/L 123   Surgical Pathology  CASE: [id number]  PATIENT: Honor Meding  Flow Pathology Report      Clinical history: CLL    DIAGNOSIS:   -  Monoclonal B-cell population with co-expression of CD5 comprises 80%  of all lymphocytes  -  See comment   COMMENT:   The phenotypic features are consistent with involvement by non-Hodgkin  B-cell lymphoma. Given the presence of CD5 expression, the differential  diagnosis includes chronic lymphocytic leukemia/small lymphocytic  lymphoma and mantle cell lymphoma.   PATHOLOGY SURGICAL PATHOLOGY   THIS IS AN ADDENDUM REPORT   CASE: [id number]  PATIENT: Doree Barthel  Surgical Pathology Report  Addendum   Reason for Addendum #1:  Immunohistochemistry results   Clinical History: Acute Cholecystitis ([facility])      FINAL MICROSCOPIC  DIAGNOSIS:   A. GALLBLADDER, CHOLECYSTECTOMY:  - Chronic cholecystitis, without cholelithiasis.  - Atypical CD5+ B cell infiltrate, most suggestive of involvement by  CLL/SLL.   COMMENT:   A. Given the presence of marked lymphocytic inflammation,  immunohistochemical studies were performed. Studies show a significant  predominance of CD20+ B cells, with aberrant co-expression of CD5 and  CD23. There are relatively fewer background CD3+ T cells. The features  are most suggestive of involvement by CLL/SLL. An immunohistochemical  study for Cyclin-D1 is pending, and will be reported as an addendum.  Recommend correlation with peripheral blood findings and evaluation for  other potential sites of adenopathy.     RADIOGRAPHIC STUDIES: I have personally reviewed the radiological images as listed and agreed with the findings in the report. No results found.  ASSESSMENT & PLAN:   88 year old female with recent laparoscopic cholecystectomy noted to have  1) recently diagnosed CLL/SLL with 13 q. and 11  q. Deletions. 2) CD5 positive lymphoid infiltrate in gallbladder pathology suggestive of involvement by CLL/SLL -CT abdomen was done recently on 03/25/2021-did not show significant lymphadenopathy or hepatosplenomegaly.  Noted to have some lung nodules in the lower lobes.  PLAN Patient's labs from today were discussed in detail with her. CBC shows hemoglobin of 12.2 with a WBC count of 18.1k platelet count of 136k CMP stable LDH 126 CT chest done on 08/06/2021 showed numerous subcentimeter bilateral axillary subpectoral and mediastinal lymph nodes.  No overt lymphadenopathy in the chest.  Unchanged nodules in bilateral lower lobes of the lung which are nonspecific. -She is recommended to try to increase her sodium intake. We also discussed that her low sodium levels might related to her thyroid and we discussed getting her thyroid labs with her PCP. -She is advised to stay up to date with age appropriate vaccinations along with the RSV vaccine.  3) .  Basal cell carcinoma on the right side of the nose Seeing Dr. Wilhemina Bonito at [facility] dermatology for management  -Continue active surveillance for recurrent skin cancers since the CLL especially with 13 q. deletion would be a risk factor.  4) Patient Active Problem List   Diagnosis Date Noted   CLL (chronic lymphocytic leukemia) ([place]) 09/30/2021   Cholangitis 03/26/2021   Cholelithiasis 03/25/2021   Elevated LFTs 03/25/2021   Chronic hyponatremia 03/25/2021   Bilateral primary osteoarthritis of knee 06/11/2019   Pain in left shoulder 05/22/2019   Unspecified vitamin D deficiency 12/25/2013   SPINAL STENOSIS, LUMBAR 03/09/2010   Osteoporosis 03/09/2010   MIGRAINE, OPHTHALMIC 10/29/2009   ADENOMATOUS COLONIC POLYP 06/10/2009   History of iron deficiency 05/06/2009   Carotid artery disease ([place]) 07/28/2008   Allergic rhinitis 03/20/2008   DYSPHAGIA 03/12/2008   Ganglion of tendon sheath 08/17/2007   Hypothyroidism 11/15/2006    Hyperlipidemia 11/07/2006   Essential hypertension 10/18/2006   GERD 10/18/2006   DIVERTICULOSIS, COLON 10/18/2006   -continue f/u with PCP for mx of other chronic medical comorbidities.  Follow-up Return to clinic with Dr. Irene Limbo with labs in 6 months  The total time spent in the appointment was 20 minutes*.  All of the patient's questions were answered with apparent satisfaction. The patient knows to call the clinic with any problems, questions or concerns.   Sullivan Lone MD MS AAHIVMS [facility] Hematology/Oncology Physician [facility]  .*Total Encounter Time as defined by the Centers for Medicare and Medicaid Services includes, in addition to the face-to-face time of a patient visit (documented in the note  above) non-face-to-face time: obtaining and reviewing outside history, ordering and reviewing medications, tests or procedures, care coordination (communications with other health care professionals or caregivers) and documentation in the medical record.  I, Melene Muller, am acting as scribe for Dr. Sullivan Lone, MD.  .I have reviewed the above documentation for accuracy and completeness, and I agree with the above. Brunetta Genera MD

## 2021-12-20 DIAGNOSIS — M419 Scoliosis, unspecified: Secondary | ICD-10-CM | POA: Diagnosis not present

## 2021-12-24 ENCOUNTER — Other Ambulatory Visit: Payer: Self-pay | Admitting: Family Medicine

## 2021-12-24 DIAGNOSIS — K2100 Gastro-esophageal reflux disease with esophagitis, without bleeding: Secondary | ICD-10-CM

## 2021-12-27 ENCOUNTER — Encounter: Payer: Self-pay | Admitting: *Deleted

## 2021-12-27 ENCOUNTER — Ambulatory Visit (INDEPENDENT_AMBULATORY_CARE_PROVIDER_SITE_OTHER): Payer: Medicare Other

## 2021-12-27 DIAGNOSIS — Z0000 Encounter for general adult medical examination without abnormal findings: Secondary | ICD-10-CM

## 2021-12-27 NOTE — Progress Notes (Signed)
Virtual Visit via Telephone Note  I connected with  Rebecca [name] on 12/27/21 at 10:45 AM EDT by telephone and verified that I am speaking with the correct person using two identifiers.  Medicare Annual Wellness visit completed telephonically due to Covid-19 pandemic.   Persons participating in this call: This Health Coach and this patient.   Location: Patient: home Provider: office    I discussed the limitations, risks, security and privacy concerns of performing an evaluation and management service by telephone and the availability of in person appointments. The patient expressed understanding and agreed to proceed.  Unable to perform video visit due to video visit attempted and failed and/or patient does not have video capability.   Some vital signs may be absent or patient reported.   Willette Brace, LPN   Subjective:   Rebecca [name] is a 88 y.o. female who presents for an Initial Medicare Annual Wellness Visit.  Review of Systems     Cardiac Risk Factors include: advanced age (>51mn, >>58women);dyslipidemia;hypertension     Objective:    There were no vitals filed for this visit. There is no height or weight on file to calculate BMI.     12/27/2021   10:31 AM 03/25/2021    9:08 PM 03/25/2021    3:44 PM 11/19/2019   11:29 AM 07/29/2019    2:54 PM 05/09/2018    2:41 PM 02/08/2017    1:52 PM  Advanced Directives  Does Patient Have a Medical Advance Directive? Yes Yes No Yes Yes Yes Yes  Type of [place]amed[place]of [place] will [place] will  [place] will  [place] will   Does patient want to make changes to medical advance directive?  No - Patient declined  No - Patient declined No - Patient declined    Copy of [place]in Chart? No - copy requested No - copy requested  No - copy requested  No - copy requested     Current Medications  (verified) Outpatient Encounter Medications as of 12/27/2021  Medication Sig   ALPRAZolam (XANAX) 0.25 MG tablet TAKE 1 TABLET(0.25 MG) BY MOUTH AT BEDTIME AS NEEDED. Do not drive for 8 hours after taking. Stop taking if you have any falls until next visit with physician   Calcium Carb-Cholecalciferol (CALCIUM-VITAMIN D) 600-400 MG-UNIT TABS Take 1 tablet by mouth daily.   Cholecalciferol (VITAMIN D3) 2000 UNITS TABS Take 1 tablet by mouth daily.   diclofenac sodium (VOLTAREN) 1 % GEL Apply 2 g topically 4 (four) times daily.   esomeprazole (NEXIUM) 20 MG capsule Take 20 mg by mouth 2 (two) times daily before a meal.   estradiol (ESTRACE) 0.1 MG/GM vaginal cream Place vaginally.   latanoprost (XALATAN) 0.005 % ophthalmic solution Place 1 drop into both eyes at bedtime.   levothyroxine (SYNTHROID) 88 MCG tablet Take 1 tablet (88 mcg total) by mouth daily before breakfast.   loperamide (IMODIUM) 2 MG capsule Take 2 capsules (4 mg total) by mouth every 6 (six) hours as needed for diarrhea or loose stools.   losartan (COZAAR) 100 MG tablet Take 1 tablet (100 mg total) by mouth daily.   metoCLOPramide (REGLAN) 5 MG tablet TAKE 1 TABLET BY MOUTH TWICE DAILY WITH MEALS   rosuvastatin (CRESTOR) 20 MG tablet TAKE 1 TABLET(20 MG) BY MOUTH DAILY   timolol (TIMOPTIC) 0.5 % ophthalmic solution Place 1 drop into both eyes 2 (two) times daily.  vitamin B-12 (CYANOCOBALAMIN) 500 MCG tablet Take 500 mcg by mouth every morning.   vitamin C (ASCORBIC ACID) 500 MG tablet Take 500 mg by mouth daily.   [DISCONTINUED] olmesartan (BENICAR) 20 MG tablet Take 20 mg by mouth daily.     No facility-administered encounter medications on file as of 12/27/2021.    Allergies (verified) Levaquin [levofloxacin], Sulfamethoxazole-trimethoprim, Lactose intolerance (gi), Tramadol hcl, Augmentin [amoxicillin-pot clavulanate], Ciprofloxacin, Piperacillin, Pneumococcal vaccine polyvalent, Zosyn [piperacillin sod-tazobactam so],  Adhesive [tape], and Codeine   History: Past Medical History:  Diagnosis Date   Anemia    Anxiety    Bifascicular block    Diverticulitis    Diverticulosis    Gastroparesis    GERD (gastroesophageal reflux disease)    Glaucoma    Hemorrhoids    Hyperlipidemia    Hypertension    Hypertension    IBS (irritable bowel syndrome)    Rectal ulcer    SCOLIOSIS, LUMBAR SPINE 03/09/2010   Qualifier: Diagnosis of  By: Arnoldo Morale MD, John E    Segmental colitis [facility])    Tubular adenoma of colon 05/2009   Past Surgical History:  Procedure Laterality Date   APPENDECTOMY     ARCUATE KERATECTOMY     BILIARY DILATION  03/27/2021   Procedure: BILIARY DILATION;  Surgeon: [name], MD;  Location: [facility];  Service: Gastroenterology;;   CATARACT EXTRACTION, BILATERAL  2015   CHOLECYSTECTOMY N/A 03/28/2021   Procedure: LAPAROSCOPIC CHOLECYSTECTOMY;  Surgeon: Leighton Ruff, MD;  Location: [facility];  Service: General;  Laterality: N/A;   double pallital tori bone removed      ERCP N/A 03/27/2021   Procedure: ENDOSCOPIC RETROGRADE CHOLANGIOPANCREATOGRAPHY (ERCP);  Surgeon: [name], MD;  Location: [facility];  Service: Gastroenterology;  Laterality: N/A;   HERNIA REPAIR     umbilical   KNEE SURGERY Bilateral    morton's neuroma on rt foot and platar facial release     MOUTH SURGERY     "bone shaved off roof of mouth"   REMOVAL OF STONES  03/27/2021   Procedure: REMOVAL OF STONES;  Surgeon: [name], MD;  Location: [facility];  Service: Gastroenterology;;   SALIVARY GLAND SURGERY     removal   SPHINCTEROTOMY  03/27/2021   Procedure: SPHINCTEROTOMY;  Surgeon: [name], MD;  Location: [facility];  Service: Gastroenterology;;   TONSILLECTOMY     Family History  Problem Relation Age of Onset   Diabetes Mother    Heart disease Mother    Hyperlipidemia Mother    Hypertension Mother    Anuerysm Father        abdominal- heavy smoker   Heart disease Father     Hypertension Father    Colon polyps Sister    Lung cancer Sister        smoker   Colon polyps Brother    Diabetes Brother    Hyperlipidemia Brother    Hypertension Brother    Heart disease Brother    Colon cancer Paternal Grandmother        with possible stomach cancer   Migraines Daughter    Kidney Stones Daughter    Colon polyps Daughter    Breast cancer Daughter    Cancer Daughter        salivary   Colon cancer Maternal Aunt        Rectal cancer   Crohn's disease Neg Hx    Pancreatic cancer Neg Hx    Social History   Socioeconomic History  Marital status: Widowed    Spouse name: Not on file   Number of children: 3   Years of education: Not on file   Highest education level: Not on file  Occupational History   Occupation: Glass blower/designer    Comment: retired  Tobacco Use   Smoking status: Never   Smokeless tobacco: Never  Vaping Use   Vaping Use: Never used  Substance and Sexual Activity   Alcohol use: Yes    Alcohol/week: 0.0 standard drinks of alcohol    Comment: rare   Drug use: No   Sexual activity: Not Currently  Other Topics Concern   Not on file  Social History Narrative   Widowed. 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      Hobbies: Enjoys reading, especially mysteries, Walks for exercise   Attends church                  Social Determinants of Health   Financial Resource Strain: [place]  (12/27/2021)   Overall Financial Resource Strain (CARDIA)    Difficulty of Paying Living Expenses: Not hard at all  Food Insecurity: No Food Insecurity (12/27/2021)   Hunger Vital Sign    Worried About Running Out of Food in the Last Year: Never true    [place] in the Last Year: Never true  Transportation Needs: No Transportation Needs (12/27/2021)   PRAPARE - Hydrologist (Medical): No    Lack of  Transportation (Non-Medical): No  Physical Activity: Insufficiently Active (12/27/2021)   Exercise Vital Sign    Days of Exercise per Week: 7 days    Minutes of Exercise per Session: 20 min  Stress: No Stress Concern Present (12/27/2021)   [place]    Feeling of Stress : Not at all  Social Connections: Moderately Isolated (12/27/2021)   Social Connection and Isolation Panel [NHANES]    Frequency of Communication with Friends and Family: More than three times a week    Frequency of Social Gatherings with Friends and Family: More than three times a week    Attends Religious Services: More than 4 times per year    Active Member of [employer] or Organizations: No    Attends Archivist Meetings: Never    Marital Status: Widowed    Tobacco Counseling Counseling given: Not Answered   Clinical Intake:  Pre-visit preparation completed: Yes  Pain : No/denies pain     Nutritional Risks: None Diabetes: No  How often do you need to have someone help you when you read instructions, pamphlets, or other written materials from your doctor or pharmacy?: 1 - Never  Diabetic?no  Interpreter Needed?: No  Information entered by :: Charlott Rakes, LPN   Activities of Daily Living    12/27/2021   10:33 AM 03/25/2021    9:11 PM  In your present state of health, do you have any difficulty performing the following activities:  Hearing? 0   Vision? 0   Difficulty concentrating or making decisions? 0   Walking or climbing stairs? 0   Dressing or bathing? 0   Doing errands, shopping? 0 0  Preparing Food and eating ? N   Using the Toilet? N   In the past six months, have you accidently leaked urine? N   Do you have problems  with loss of bowel control? N   Managing your Medications? N   Managing your Finances? N   Housekeeping or managing your Housekeeping? N     Patient Care Team: Marin Olp, MD as PCP -  General (Family Medicine) Ladene Artist, MD as Consulting Physician (Gastroenterology) Aloha Gell, MD as Consulting Physician (Obstetrics and Gynecology) Luberta Mutter, MD as Consulting Physician (Ophthalmology) [name], [facility] as Pharmacist (Pharmacist)  Indicate any recent Medical Services you may have received from other than [facility] providers in the past year (date may be approximate).     Assessment:   This is a routine wellness examination for Rebecca [name].  Hearing/Vision screen Hearing Screening - Comments:: Wears hearing aids  Vision Screening - Comments:: Pt follows up with Dr Ellie Lunch for annual eye exams   Dietary issues and exercise activities discussed: Current Exercise Habits: Home exercise routine, Type of exercise: walking, Time (Minutes): 20, Frequency (Times/Week): 7, Weekly Exercise (Minutes/Week): 140   Goals Addressed             This Visit's Progress    Patient Stated       None at this time        Depression Screen    12/27/2021   10:29 AM 09/16/2021   11:39 AM 05/18/2021   10:26 AM 04/09/2021   11:05 AM 03/20/2020   10:19 AM 11/19/2019   11:31 AM 02/15/2019    9:57 AM  PHQ 2/9 Scores  PHQ - 2 Score 0 0 0 0 0 0 0  PHQ- 9 Score  0 0 3  0     Fall Risk    12/27/2021   10:32 AM 09/16/2021   11:40 AM 05/18/2021   10:26 AM 04/09/2021   11:05 AM 04/09/2021   11:04 AM  Fall Risk   Falls in the past year? 1 0 0 0 0  Number falls in past yr: 1 0     Injury with Fall? 0 0     Risk for fall due to : Impaired vision No Fall Risks     Follow up Falls prevention discussed Falls evaluation completed       FALL RISK PREVENTION PERTAINING TO THE HOME:  Any stairs in or around the home? No  If so, are there any without handrails? No  Home free of loose throw rugs in walkways, pet beds, electrical cords, etc? Yes  Adequate lighting in your home to reduce risk of falls? Yes   ASSISTIVE DEVICES UTILIZED TO PREVENT FALLS:  Life alert? No  Use of a  cane, walker or w/c? Yes  Grab bars in the bathroom? Yes  Shower chair or bench in shower? No  Elevated toilet seat or a handicapped toilet? No   TIMED UP AND GO:  Was the test performed? No .   Cognitive Function: pt is alert to time and place, declined full 6 cit at this time         12/27/2021   10:33 AM 11/19/2019   11:34 AM  6CIT Screen  What Year? 0 points 0 points  What month? 0 points 0 points  What time?  0 points  Count back from 20  0 points  Months in reverse  0 points  Repeat phrase  0 points  Total Score  0 points    Immunizations Immunization History  Administered Date(s) Administered   Fluad Quad(high Dose 65+) 12/31/2018, 01/06/2021   H1N1 06/02/2008   Influenza Split 12/29/2010   Influenza  Whole 04/04/2001, 02/01/2007, 12/24/2007, 12/16/2008, 01/15/2009, 01/05/2010   Influenza, High Dose Seasonal PF 12/31/2014, 01/01/2016, 01/11/2017, 01/02/2018   Influenza,inj,Quad PF,6+ Mos 12/19/2012, 12/25/2013   Influenza-Unspecified 02/10/2020   PFIZER(Purple Top)SARS-COV-2 Vaccination 05/06/2019, 05/27/2019, 02/10/2020, 01/10/2021   Pneumococcal Polysaccharide-23 03/05/1999   Td 04/05/1999, 07/28/2008   Zoster Recombinat (Shingrix) 08/04/2016, 10/12/2016   Zoster, Live 10/24/2007    TDAP status: Due, Education has been provided regarding the importance of this vaccine. Advised may receive this vaccine at local pharmacy or Health Dept. Aware to provide a copy of the vaccination record if obtained from local pharmacy or Health Dept. Verbalized acceptance and understanding.  Flu Vaccine status: Due, Education has been provided regarding the importance of this vaccine. Advised may receive this vaccine at local pharmacy or Health Dept. Aware to provide a copy of the vaccination record if obtained from local pharmacy or Health Dept. Verbalized acceptance and understanding.  Pneumococcal vaccine status: Due, Education has been provided regarding the importance of this  vaccine. Advised may receive this vaccine at local pharmacy or Health Dept. Aware to provide a copy of the vaccination record if obtained from local pharmacy or Health Dept. Verbalized acceptance and understanding.  Covid-19 vaccine status: Completed vaccines  Qualifies for Shingles Vaccine? Yes   Zostavax completed Yes   Shingrix Completed?: Yes  Screening Tests Health Maintenance  Topic Date Due   COVID-19 Vaccine (5 - Pfizer risk series) 03/07/2021   INFLUENZA VACCINE  11/02/2021   TETANUS/TDAP  09/17/2022 (Originally 07/29/2018)   Pneumonia Vaccine 56+ Years old (2 - PCV) 10/14/2022 (Originally 03/04/2000)   DEXA SCAN  Completed   Zoster Vaccines- Shingrix  Completed   HPV VACCINES  Aged Out   MAMMOGRAM  Discontinued    Health Maintenance  Health Maintenance Due  Topic Date Due   COVID-19 Vaccine (5 - Pfizer risk series) 03/07/2021   INFLUENZA VACCINE  11/02/2021    Colorectal cancer screening: No longer required.   Mammogram status: Completed 06/01/20. Repeat every year  Bone Density status: Completed 05/01/17. Results reflect: Bone density results: OSTEOPOROSIS. Repeat every as directed  years.   Additional Screening:   Vision Screening: Recommended annual ophthalmology exams for early detection of glaucoma and other disorders of the eye. Is the patient up to date with their annual eye exam?  Yes  Who is the provider or what is the name of the office in which the patient attends annual eye exams? Dr Ellie Lunch If pt is not established with a provider, would they like to be referred to a provider to establish care? No .   Dental Screening: Recommended annual dental exams for proper oral hygiene  Community Resource Referral / Chronic Care Management: CRR required this visit?  No   CCM required this visit?  No      Plan:     I have personally reviewed and noted the following in the patient's chart:   Medical and social history Use of alcohol, tobacco or illicit  drugs  Current medications and supplements including opioid prescriptions. Patient is not currently taking opioid prescriptions. Functional ability and status Nutritional status Physical activity Advanced directives List of other physicians Hospitalizations, surgeries, and ER visits in previous 12 months Vitals Screenings to include cognitive, depression, and falls Referrals and appointments  In addition, I have reviewed and discussed with patient certain preventive protocols, quality metrics, and best practice recommendations. A written personalized care plan for preventive services as well as general preventive health recommendations were provided to patient.  Willette Brace, LPN   [id number]   Nurse Notes: none

## 2021-12-27 NOTE — Patient Instructions (Addendum)
Rebecca [name] , Thank you for taking time to come for your Medicare Wellness Visit. I appreciate your ongoing commitment to your health goals. Please review the following plan we discussed and let me know if I can assist you in the future.   These are the goals we discussed:  Goals      patient     Will continue walking; try to build up to 30 minutes 5 days a week      Patient Stated     Maintain level of function, staying independent     Patient Stated     I would like to be able to walk without my cruches     Patient Stated     None at this time         This is a list of the screening recommended for you and due dates:  Health Maintenance  Topic Date Due   COVID-19 Vaccine (5 - Pfizer risk series) 03/07/2021   Flu Shot  11/02/2021   Tetanus Vaccine  09/17/2022*   Pneumonia Vaccine (2 - PCV) 10/14/2022*   DEXA scan (bone density measurement)  Completed   Zoster (Shingles) Vaccine  Completed   HPV Vaccine  Aged Out   Mammogram  Discontinued  *Topic was postponed. The date shown is not the original due date.    Advanced directives: Please bring a copy of your health care power of attorney and living will to the office at your convenience.  Conditions/risks identified: none at this time   Next appointment: Follow up in one year for your annual wellness visit    Preventive Care 65 Years and Older, Female Preventive care refers to lifestyle choices and visits with your health care provider that can promote health and wellness. What does preventive care include? A yearly physical exam. This is also called an annual well check. Dental exams once or twice a year. Routine eye exams. Ask your health care provider how often you should have your eyes checked. Personal lifestyle choices, including: Daily care of your teeth and gums. Regular physical activity. Eating a healthy diet. Avoiding tobacco and drug use. Limiting alcohol use. Practicing safe sex. Taking low-dose  aspirin every day. Taking vitamin and mineral supplements as recommended by your health care provider. What happens during an annual well check? The services and screenings done by your health care provider during your annual well check will depend on your age, overall health, lifestyle risk factors, and family history of disease. Counseling  Your health care provider may ask you questions about your: Alcohol use. Tobacco use. Drug use. Emotional well-being. Home and relationship well-being. Sexual activity. Eating habits. History of falls. Memory and ability to understand (cognition). Work and work Statistician. Reproductive health. Screening  You may have the following tests or measurements: Height, weight, and BMI. Blood pressure. Lipid and cholesterol levels. These may be checked every 5 years, or more frequently if you are over 64 years old. Skin check. Lung cancer screening. You may have this screening every year starting at age 54 if you have a 30-pack-year history of smoking and currently smoke or have quit within the past 15 years. Fecal occult blood test (FOBT) of the stool. You may have this test every year starting at age 53. Flexible sigmoidoscopy or colonoscopy. You may have a sigmoidoscopy every 5 years or a colonoscopy every 10 years starting at age 58. Hepatitis C blood test. Hepatitis B blood test. Sexually transmitted disease (STD) testing. Diabetes screening. This is  done by checking your blood sugar (glucose) after you have not eaten for a while (fasting). You may have this done every 1-3 years. Bone density scan. This is done to screen for osteoporosis. You may have this done starting at age 67. Mammogram. This may be done every 1-2 years. Talk to your health care provider about how often you should have regular mammograms. Talk with your health care provider about your test results, treatment options, and if necessary, the need for more tests. Vaccines  Your  health care provider may recommend certain vaccines, such as: Influenza vaccine. This is recommended every year. Tetanus, diphtheria, and acellular pertussis (Tdap, Td) vaccine. You may need a Td booster every 10 years. Zoster vaccine. You may need this after age 71. Pneumococcal 13-valent conjugate (PCV13) vaccine. One dose is recommended after age 39. Pneumococcal polysaccharide (PPSV23) vaccine. One dose is recommended after age 3. Talk to your health care provider about which screenings and vaccines you need and how often you need them. This information is not intended to replace advice given to you by your health care provider. Make sure you discuss any questions you have with your health care provider. Document Released: 04/17/2015 Document Revised: 12/09/2015 Document Reviewed: 01/20/2015 Elsevier Interactive Patient Education  2017 [employer] Prevention in the Home Falls can cause injuries. They can happen to people of all ages. There are many things you can do to make your home safe and to help prevent falls. What can I do on the outside of my home? Regularly fix the edges of walkways and driveways and fix any cracks. Remove anything that might make you trip as you walk through a door, such as a raised step or threshold. Trim any bushes or trees on the path to your home. Use bright outdoor lighting. Clear any walking paths of anything that might make someone trip, such as rocks or tools. Regularly check to see if handrails are loose or broken. Make sure that both sides of any steps have handrails. Any raised decks and porches should have guardrails on the edges. Have any leaves, snow, or ice cleared regularly. Use sand or salt on walking paths during winter. Clean up any spills in your garage right away. This includes oil or grease spills. What can I do in the bathroom? Use night lights. Install grab bars by the toilet and in the tub and shower. Do not use towel bars as  grab bars. Use non-skid mats or decals in the tub or shower. If you need to sit down in the shower, use a plastic, non-slip stool. Keep the floor dry. Clean up any water that spills on the floor as soon as it happens. Remove soap buildup in the tub or shower regularly. Attach bath mats securely with double-sided non-slip rug tape. Do not have throw rugs and other things on the floor that can make you trip. What can I do in the bedroom? Use night lights. Make sure that you have a light by your bed that is easy to reach. Do not use any sheets or blankets that are too big for your bed. They should not hang down onto the floor. Have a firm chair that has side arms. You can use this for support while you get dressed. Do not have throw rugs and other things on the floor that can make you trip. What can I do in the kitchen? Clean up any spills right away. Avoid walking on wet floors. Keep items that you  use a lot in easy-to-reach places. If you need to reach something above you, use a strong step stool that has a grab bar. Keep electrical cords out of the way. Do not use floor polish or wax that makes floors slippery. If you must use wax, use non-skid floor wax. Do not have throw rugs and other things on the floor that can make you trip. What can I do with my stairs? Do not leave any items on the stairs. Make sure that there are handrails on both sides of the stairs and use them. Fix handrails that are broken or loose. Make sure that handrails are as long as the stairways. Check any carpeting to make sure that it is firmly attached to the stairs. Fix any carpet that is loose or worn. Avoid having throw rugs at the top or bottom of the stairs. If you do have throw rugs, attach them to the floor with carpet tape. Make sure that you have a light switch at the top of the stairs and the bottom of the stairs. If you do not have them, ask someone to add them for you. What else can I do to help prevent  falls? Wear shoes that: Do not have high heels. Have rubber bottoms. Are comfortable and fit you well. Are closed at the toe. Do not wear sandals. If you use a stepladder: Make sure that it is fully opened. Do not climb a closed stepladder. Make sure that both sides of the stepladder are locked into place. Ask someone to hold it for you, if possible. Clearly mark and make sure that you can see: Any grab bars or handrails. First and last steps. Where the edge of each step is. Use tools that help you move around (mobility aids) if they are needed. These include: Canes. Walkers. Scooters. Crutches. Turn on the lights when you go into a dark area. Replace any light bulbs as soon as they burn out. Set up your furniture so you have a clear path. Avoid moving your furniture around. If any of your floors are uneven, fix them. If there are any pets around you, be aware of where they are. Review your medicines with your doctor. Some medicines can make you feel dizzy. This can increase your chance of falling. Ask your doctor what other things that you can do to help prevent falls. This information is not intended to replace advice given to you by your health care provider. Make sure you discuss any questions you have with your health care provider. Document Released: 01/15/2009 Document Revised: 08/27/2015 Document Reviewed: 04/25/2014 Elsevier Interactive Patient Education  2017 [employer].

## 2021-12-28 ENCOUNTER — Ambulatory Visit: Payer: Medicare Other | Admitting: Orthopaedic Surgery

## 2021-12-30 DIAGNOSIS — M48061 Spinal stenosis, lumbar region without neurogenic claudication: Secondary | ICD-10-CM | POA: Diagnosis not present

## 2021-12-30 DIAGNOSIS — M5116 Intervertebral disc disorders with radiculopathy, lumbar region: Secondary | ICD-10-CM | POA: Diagnosis not present

## 2021-12-30 DIAGNOSIS — M5117 Intervertebral disc disorders with radiculopathy, lumbosacral region: Secondary | ICD-10-CM | POA: Diagnosis not present

## 2021-12-30 DIAGNOSIS — M5416 Radiculopathy, lumbar region: Secondary | ICD-10-CM | POA: Diagnosis not present

## 2021-12-30 DIAGNOSIS — M419 Scoliosis, unspecified: Secondary | ICD-10-CM | POA: Diagnosis not present

## 2022-01-06 DIAGNOSIS — Z23 Encounter for immunization: Secondary | ICD-10-CM | POA: Diagnosis not present

## 2022-01-24 ENCOUNTER — Other Ambulatory Visit: Payer: Self-pay | Admitting: Family Medicine

## 2022-01-26 DIAGNOSIS — Z23 Encounter for immunization: Secondary | ICD-10-CM | POA: Diagnosis not present

## 2022-02-04 ENCOUNTER — Telehealth: Payer: Self-pay | Admitting: Pharmacist

## 2022-02-04 NOTE — Progress Notes (Signed)
Chronic Care Management Pharmacy Assistant   Name: ANJELITA SHEAHAN  MRN: [id number] DOB: [birth year]   Reason for Encounter: General Adherence Call   Recent office visits:  10/13/2021 OV (Fam Med) Allwardt, Randa Evens, PA-C; no medication changes indicated.  09/30/2021 OV (PCP) Marin Olp, MD; she has not been able to tolerate the rosuvastatin 20 mg due to a weakness/jellylike sensation in her legs. Also did not tolerate atorvastatin in the past due to weakness - She would like to see how she would do on just 10 mg of rosuvastatin and she is going to try that and update me within a month if she is tolerating that-   Recent consult visits:  12/20/2021 OV ([place]) Kristeen Miss MD; no further information available.  12/14/2021 OV (Orthopedics) Persons, Bevely Palmer, [place]; We discussed using Voltaren gel which she has used in the past.   12/14/2021 OV (Oncology) Brunetta Genera, MD; no medication changes indicated.  11/26/2021 OV (Otolaryngology) Spainhour, Ann Lions, PA-C; no medication changes indicated.  11/25/2021 OV (Orthopedics) Garald Balding, MD; no medication changes indicated.  11/17/2021 OV (Orthopedics) Persons, Bevely Palmer, [place]; no medication changes indicated.  11/12/2021 OV (Orthopedics) Persons, Bevely Palmer, [place]; no medication changes indicated.  11/04/2021 OV (Orthopedics) Garald Balding, MD; no medication changes indicated.  10/27/2021 OV (Podiatry) Wallene Huh, DPM; no medication changes indicated.  Hospital visits:  None in previous 6 months  Medications: Outpatient Encounter Medications as of 02/04/2022  Medication Sig   ALPRAZolam (XANAX) 0.25 MG tablet TAKE 1 TABLET BY MOUTH AT BEDTIME AS NEEDED, DO NOT DRIVE FOR 8 HOURS AFTER TAKING AND STOP IF ANY FALLS UNTIL NEXT DOCTOR VISIT   Calcium Carb-Cholecalciferol (CALCIUM-VITAMIN D) 600-400 MG-UNIT TABS Take 1 tablet by mouth daily.   Cholecalciferol (VITAMIN D3) 2000  UNITS TABS Take 1 tablet by mouth daily.   diclofenac sodium (VOLTAREN) 1 % GEL Apply 2 g topically 4 (four) times daily.   esomeprazole (NEXIUM) 20 MG capsule Take 20 mg by mouth 2 (two) times daily before a meal.   estradiol (ESTRACE) 0.1 MG/GM vaginal cream Place vaginally.   latanoprost (XALATAN) 0.005 % ophthalmic solution Place 1 drop into both eyes at bedtime.   levothyroxine (SYNTHROID) 88 MCG tablet Take 1 tablet (88 mcg total) by mouth daily before breakfast.   loperamide (IMODIUM) 2 MG capsule Take 2 capsules (4 mg total) by mouth every 6 (six) hours as needed for diarrhea or loose stools.   losartan (COZAAR) 100 MG tablet Take 1 tablet (100 mg total) by mouth daily.   metoCLOPramide (REGLAN) 5 MG tablet TAKE 1 TABLET BY MOUTH TWICE DAILY WITH MEALS   rosuvastatin (CRESTOR) 20 MG tablet TAKE 1 TABLET(20 MG) BY MOUTH DAILY   timolol (TIMOPTIC) 0.5 % ophthalmic solution Place 1 drop into both eyes 2 (two) times daily.   vitamin B-12 (CYANOCOBALAMIN) 500 MCG tablet Take 500 mcg by mouth every morning.   vitamin C (ASCORBIC ACID) 500 MG tablet Take 500 mg by mouth daily.   [DISCONTINUED] olmesartan (BENICAR) 20 MG tablet Take 20 mg by mouth daily.     No facility-administered encounter medications on file as of 02/04/2022.   Contacted [facility] for [employer] Review Call   Chart Review:  Have there been any documented new, changed, or discontinued medications since last visit? No  Has there been any documented recent hospitalizations or ED visits since last visit with Clinical Pharmacist? No Brief Summary (including  medication and/or Diagnosis changes): Patient states she tried to take Rosuvastatin 10 mg. She states she stopped taking it because it made her have restless legs syndrome at night.   Adherence Review:  Does the Clinical Pharmacist Assistant have access to adherence rates? Yes Adherence rates for STAR metric medications:  Losartan 100 mg last filled 02/04/2022 90  DS Rosuvastatin 20 mg last filled 02/04/2022 90 DS Does the patient have >5 day gap between last estimated fill dates for any of the above medications or other medication gaps? No Reason for medication gaps. none   Disease State Questions:  Able to connect with Patient? Yes Did patient have any problems with their health recently? No Have you had any admissions or emergency room visits or worsening of your condition(s) since last visit? No Have you had any visits with new specialists or providers since your last visit? No Have you had any new health care problem(s) since your last visit? No Have you run out of any of your medications since you last spoke with clinical pharmacist? No Are there any medications you are not taking as prescribed? Yes Are you having any issues or side effects with your medications? Yes Note of issues or side effects: Patient states she stopped taking Rosuvastatin as it made her legs feel like Jello. She also states this medication caused her to have restless legs at night. She states all symptoms have resolved since she stopped taking said medication. Do you have any other health concerns or questions you want to discuss with your Clinical Pharmacist before your next visit? No Are there any health concerns that you feel we can do a better job addressing? No Are you having any problems with any of the following since the last visit: (select all that apply)  None 12. Any falls since last visit? No 13. Any increased or uncontrolled pain since last visit? No  Care Gaps: Medicare Annual Wellness: Completed 12/27/2021 Hemoglobin A1C: None available Colonoscopy: Aged out Dexa Scan: Completed Mammogram: Aged out  Future Appointments  Date Time Provider [place]  03/17/2022 11:20 AM Marin Olp, MD [facility]  04/18/2022 11:00 AM [facility] CCM PHARMACIST [facility]  06/14/2022 12:30 PM [facility] None  06/14/2022  1:00 PM Brunetta Genera, MD [facility] None  01/05/2023 11:00 AM [facility] HEALTH COACH [facility]  Star Rating Drugs: Losartan 100 mg last filled 02/04/2022 90 DS Rosuvastatin 20 mg last filled 02/04/2022 90 DS  April D Calhoun, [place] Pharmacist Assistant [phone]

## 2022-02-25 ENCOUNTER — Other Ambulatory Visit: Payer: Self-pay

## 2022-02-25 ENCOUNTER — Emergency Department (HOSPITAL_COMMUNITY): Payer: Medicare Other

## 2022-02-25 ENCOUNTER — Emergency Department (HOSPITAL_COMMUNITY)
Admission: EM | Admit: 2022-02-25 | Discharge: 2022-02-25 | Disposition: A | Discharge status: Home / Self Care | Payer: Medicare Other | Attending: Emergency Medicine | Admitting: Emergency Medicine

## 2022-02-25 ENCOUNTER — Encounter (HOSPITAL_COMMUNITY): Payer: Self-pay

## 2022-02-25 DIAGNOSIS — D72829 Elevated white blood cell count, unspecified: Secondary | ICD-10-CM | POA: Diagnosis not present

## 2022-02-25 DIAGNOSIS — E871 Hypo-osmolality and hyponatremia: Secondary | ICD-10-CM | POA: Diagnosis not present

## 2022-02-25 DIAGNOSIS — R350 Frequency of micturition: Secondary | ICD-10-CM | POA: Insufficient documentation

## 2022-02-25 DIAGNOSIS — R10817 Generalized abdominal tenderness: Secondary | ICD-10-CM | POA: Diagnosis not present

## 2022-02-25 DIAGNOSIS — I959 Hypotension, unspecified: Secondary | ICD-10-CM | POA: Diagnosis not present

## 2022-02-25 DIAGNOSIS — N281 Cyst of kidney, acquired: Secondary | ICD-10-CM | POA: Diagnosis not present

## 2022-02-25 DIAGNOSIS — R109 Unspecified abdominal pain: Secondary | ICD-10-CM | POA: Insufficient documentation

## 2022-02-25 DIAGNOSIS — N390 Urinary tract infection, site not specified: Secondary | ICD-10-CM | POA: Diagnosis not present

## 2022-02-25 DIAGNOSIS — K7689 Other specified diseases of liver: Secondary | ICD-10-CM | POA: Diagnosis not present

## 2022-02-25 LAB — CBC WITH DIFFERENTIAL/PLATELET
Abs Immature Granulocytes: 0.05 10*3/uL (ref 0.00–0.07)
Basophils Absolute: 0.1 10*3/uL (ref 0.0–0.1)
Basophils Relative: 0 %
Eosinophils Absolute: 0 10*3/uL (ref 0.0–0.5)
Eosinophils Relative: 0 %
HCT: 35.2 % — ABNORMAL LOW (ref 36.0–46.0)
Hemoglobin: 12.1 g/dL (ref 12.0–15.0)
Immature Granulocytes: 0 %
Lymphocytes Relative: 84 %
Lymphs Abs: 25.2 10*3/uL — ABNORMAL HIGH (ref 0.7–4.0)
MCH: 34.5 pg — ABNORMAL HIGH (ref 26.0–34.0)
MCHC: 34.4 g/dL (ref 30.0–36.0)
MCV: 100.3 fL — ABNORMAL HIGH (ref 80.0–100.0)
Monocytes Absolute: 1.1 10*3/uL — ABNORMAL HIGH (ref 0.1–1.0)
Monocytes Relative: 4 %
Neutro Abs: 3.7 10*3/uL (ref 1.7–7.7)
Neutrophils Relative %: 12 %
Platelets: 151 10*3/uL (ref 150–400)
RBC: 3.51 MIL/uL — ABNORMAL LOW (ref 3.87–5.11)
RDW: 13.2 % (ref 11.5–15.5)
WBC: 30.2 10*3/uL — ABNORMAL HIGH (ref 4.0–10.5)
nRBC: 0 % (ref 0.0–0.2)

## 2022-02-25 LAB — URINALYSIS, ROUTINE W REFLEX MICROSCOPIC
Bilirubin Urine: NEGATIVE
Glucose, UA: NEGATIVE mg/dL
Hgb urine dipstick: NEGATIVE
Ketones, ur: 5 mg/dL — AB
Nitrite: NEGATIVE
Protein, ur: NEGATIVE mg/dL
Specific Gravity, Urine: 1.013 (ref 1.005–1.030)
pH: 7 (ref 5.0–8.0)

## 2022-02-25 LAB — COMPREHENSIVE METABOLIC PANEL
ALT: 18 U/L (ref 0–44)
AST: 26 U/L (ref 15–41)
Albumin: 4.2 g/dL (ref 3.5–5.0)
Alkaline Phosphatase: 52 U/L (ref 38–126)
Anion gap: 9 (ref 5–15)
BUN: 13 mg/dL (ref 8–23)
CO2: 22 mmol/L (ref 22–32)
Calcium: 9.7 mg/dL (ref 8.9–10.3)
Chloride: 94 mmol/L — ABNORMAL LOW (ref 98–111)
Creatinine, Ser: 0.94 mg/dL (ref 0.44–1.00)
GFR, Estimated: 59 mL/min — ABNORMAL LOW (ref >60)
Glucose, Bld: 110 mg/dL — ABNORMAL HIGH (ref 70–99)
Potassium: 4.4 mmol/L (ref 3.5–5.1)
Sodium: 125 mmol/L — ABNORMAL LOW (ref 135–145)
Total Bilirubin: 0.9 mg/dL (ref 0.3–1.2)
Total Protein: 6.5 g/dL (ref 6.5–8.1)

## 2022-02-25 LAB — LACTIC ACID, PLASMA: Lactic Acid, Venous: 1 mmol/L (ref 0.5–1.9)

## 2022-02-25 LAB — LIPASE, BLOOD: Lipase: 26 U/L (ref 11–51)

## 2022-02-25 MED ORDER — FOSFOMYCIN TROMETHAMINE 3 G PO PACK
3.0000 g | PACK | Freq: Once | ORAL | Status: AC
  Administered 2022-02-25: 3 g via ORAL
  Filled 2022-02-25: qty 3

## 2022-02-25 MED ORDER — SODIUM CHLORIDE 0.9 % IV SOLN: INTRAVENOUS | Status: DC

## 2022-02-25 MED ORDER — IOHEXOL 300 MG/ML  SOLN
100.0000 mL | Freq: Once | INTRAMUSCULAR | Status: AC | PRN
  Administered 2022-02-25: 100 mL via INTRAVENOUS

## 2022-02-25 MED ORDER — SODIUM CHLORIDE 0.9 % IV BOLUS
500.0000 mL | Freq: Once | INTRAVENOUS | Status: AC
  Administered 2022-02-25: 500 mL via INTRAVENOUS

## 2022-02-25 NOTE — ED Provider Triage Note (Signed)
Emergency Medicine Provider Triage Evaluation Note  Rebecca [name] , a 88 y.o. female  was evaluated in triage.  Pt complains of difficulties urinating that started yesterday associated with suprapubic pressure.  Patient notes she has a history of UTIs causing urinary retention which she feels she has now.  No fever or chills.  Review of Systems  Positive: Urinary retention Negative: fever  Physical Exam  BP (!) 161/87   Pulse 72   Temp 98.1 F (36.7 C) (Oral)   Resp 18   Ht 5' 5"  (1.651 m)   Wt 56.2 kg   SpO2 100%   BMI 20.63 kg/m  Gen:   Awake, no distress   Resp:  Normal effort  MSK:   Moves extremities without difficulty  Other:    Medical Decision Making  Medically screening exam initiated at 5:27 PM.  Appropriate orders placed.  Rebecca [name] was informed that the remainder of the evaluation will be completed by another provider, this initial triage assessment does not replace that evaluation, and the importance of remaining in the ED until their evaluation is complete.  Bladder scan Labs Unable to place foley in triage.    Suzy Bouchard, [place] 02/25/22 1728

## 2022-02-25 NOTE — ED Triage Notes (Addendum)
Per EMS- Patient is from home. Patient reports that she has had frequent urination and c/o lower abdominal pain x 2 days. Patient reports a history of UTI's.  Patient added that she has voided 1 time since 0700 today.

## 2022-02-25 NOTE — ED Provider Notes (Addendum)
[facility] DEPT Provider Note   CSN: [id number] Arrival date & time: 02/25/22  1633     History  Chief Complaint  Patient presents with   Urinary Frequency   Abdominal Pain    Rebecca [name] is a 88 y.o. female.  88 year old female presents with urgency x 1 day.  Has had nausea no vomiting.  No fever or flank pain.  History of multiple UTIs in the past and this feels similar.  Does not take any chronic antibiotics.  Denies any weakness.  Does have some suprapubic pressure.  Patient does have a history of CLL.  Denies any vaginal bleeding.  Called EMS and transported here       Home Medications Prior to Admission medications   Medication Sig Start Date End Date Taking? Authorizing Provider  ALPRAZolam (XANAX) 0.25 MG tablet TAKE 1 TABLET BY MOUTH AT BEDTIME AS NEEDED, DO NOT DRIVE FOR 8 HOURS AFTER TAKING AND STOP IF ANY FALLS UNTIL NEXT DOCTOR VISIT 01/24/22   Marin Olp, MD  Calcium Carb-Cholecalciferol (CALCIUM-VITAMIN D) 600-400 MG-UNIT TABS Take 1 tablet by mouth daily.    [provider]  Cholecalciferol (VITAMIN D3) 2000 UNITS TABS Take 1 tablet by mouth daily.    [provider]  diclofenac sodium (VOLTAREN) 1 % GEL Apply 2 g topically 4 (four) times daily. 08/09/17   Burchette, Alinda Sierras, MD  esomeprazole (NEXIUM) 20 MG capsule Take 20 mg by mouth 2 (two) times daily before a meal.    [provider]  estradiol (ESTRACE) 0.1 MG/GM vaginal cream Place vaginally. 12/13/21   [provider]  latanoprost (XALATAN) 0.005 % ophthalmic solution Place 1 drop into both eyes at bedtime. 01/04/21   [provider]  levothyroxine (SYNTHROID) 88 MCG tablet Take 1 tablet (88 mcg total) by mouth daily before breakfast. 11/09/21   Marin Olp, MD  loperamide (IMODIUM) 2 MG capsule Take 2 capsules (4 mg total) by mouth every 6 (six) hours as needed for diarrhea or loose stools. 04/01/21   Antonieta Pert, MD   losartan (COZAAR) 100 MG tablet Take 1 tablet (100 mg total) by mouth daily. 09/30/21   Marin Olp, MD  metoCLOPramide (REGLAN) 5 MG tablet TAKE 1 TABLET BY MOUTH TWICE DAILY WITH MEALS 12/24/21   Burchette, Alinda Sierras, MD  rosuvastatin (CRESTOR) 20 MG tablet TAKE 1 TABLET(20 MG) BY MOUTH DAILY 06/25/21   Marin Olp, MD  timolol (TIMOPTIC) 0.5 % ophthalmic solution Place 1 drop into both eyes 2 (two) times daily. 09/08/20   [provider]  vitamin B-12 (CYANOCOBALAMIN) 500 MCG tablet Take 500 mcg by mouth every morning.    [provider]  vitamin C (ASCORBIC ACID) 500 MG tablet Take 500 mg by mouth daily.    [provider]  olmesartan (BENICAR) 20 MG tablet Take 20 mg by mouth daily.    07/28/11  [provider]      Allergies    Levaquin [levofloxacin], Sulfamethoxazole-trimethoprim, Lactose intolerance (gi), Tramadol hcl, Augmentin [amoxicillin-pot clavulanate], Ciprofloxacin, Piperacillin, Pneumococcal vaccine polyvalent, Zosyn [piperacillin sod-tazobactam so], Adhesive [tape], and Codeine    Review of Systems   Review of Systems  All other systems reviewed and are negative.   Physical Exam Updated Vital Signs BP (!) 161/87   Pulse 72   Temp 98.1 F (36.7 C) (Oral)   Resp 18   Ht 1.651 m (5' 5" )   Wt 56.2 kg   SpO2 100%  BMI 20.63 kg/m  Physical Exam Vitals and nursing note reviewed.  Constitutional:      General: She is not in acute distress.    Appearance: Normal appearance. She is well-developed. She is not toxic-appearing.  HENT:     Head: Normocephalic and atraumatic.  Eyes:     General: Lids are normal.     Conjunctiva/sclera: Conjunctivae normal.     Pupils: Pupils are equal, round, and reactive to light.  Neck:     Thyroid: No thyroid mass.     Trachea: No tracheal deviation.  Cardiovascular:     Rate and Rhythm: Normal rate and regular rhythm.     Heart sounds: Normal heart sounds. No murmur heard.    No gallop.   Pulmonary:     Effort: Pulmonary effort is normal. No respiratory distress.     Breath sounds: Normal breath sounds. No stridor. No decreased breath sounds, wheezing, rhonchi or rales.  Abdominal:     General: There is no distension.     Palpations: Abdomen is soft.     Tenderness: There is abdominal tenderness in the suprapubic area. There is no guarding or rebound.    Musculoskeletal:        General: No tenderness. Normal range of motion.     Cervical back: Normal range of motion and neck supple.  Skin:    General: Skin is warm and dry.     Findings: No abrasion or rash.  Neurological:     Mental Status: She is alert and oriented to person, place, and time. Mental status is at baseline.     GCS: GCS eye subscore is 4. GCS verbal subscore is 5. GCS motor subscore is 6.     Cranial Nerves: No cranial nerve deficit.     Sensory: No sensory deficit.     Motor: Motor function is intact.  Psychiatric:        Attention and Perception: Attention normal.        Speech: Speech normal.        Behavior: Behavior normal.     ED Results / Procedures / Treatments   Labs (all labs ordered are listed, but only abnormal results are displayed) Labs Reviewed  CBC WITH DIFFERENTIAL/PLATELET - Abnormal; Notable for the following components:      Result Value   WBC 30.2 (*)    RBC 3.51 (*)    HCT 35.2 (*)    MCV 100.3 (*)    MCH 34.5 (*)    Lymphs Abs 25.2 (*)    Monocytes Absolute 1.1 (*)    All other components within normal limits  COMPREHENSIVE METABOLIC PANEL - Abnormal; Notable for the following components:   Sodium 125 (*)    Chloride 94 (*)    Glucose, Bld 110 (*)    GFR, Estimated 59 (*)    All other components within normal limits  CULTURE, BLOOD (ROUTINE X 2)  CULTURE, BLOOD (ROUTINE X 2)  LIPASE, BLOOD  URINALYSIS, ROUTINE W REFLEX MICROSCOPIC  LACTIC ACID, PLASMA  LACTIC ACID, PLASMA    EKG None  Radiology No results found.  Procedures Procedures     Medications Ordered in ED Medications - No data to display  ED Course/ Medical Decision Making/ A&P                           Medical Decision Making Amount and/or Complexity of Data Reviewed Labs: ordered.  Risk Prescription drug management.  Patient hyponatremic here and has no history of same according to old records.  She is currently asymptomatic with this.  Was told to increase her salt intake.  Patient has significant leukocytosis of 30,000 but has history of CLL and this is consistent with that.  Urinalysis shows 6-10 white cells and trace leukocyte esterase.  Indicative of possible infection.  Patient given dose of fosfomycin here.  10:45 PM Abdominal CT per my interpretation shows no acute findings here.  Will give patient first dose of antibiotics here.  Family at bedside informed about patient sodium and the need for follow-up.  Will discharge home        Final Clinical Impression(s) / ED Diagnoses Final diagnoses:  None    Rx / DC Orders ED Discharge Orders     None         Lacretia Leigh, MD 02/25/22 2246    Lacretia Leigh, MD 02/25/22 2247

## 2022-02-28 ENCOUNTER — Telehealth: Payer: Self-pay

## 2022-02-28 ENCOUNTER — Telehealth: Payer: Self-pay | Admitting: Family Medicine

## 2022-02-28 NOTE — Patient Outreach (Signed)
  Care Coordination TOC Note Transition Care Management Unsuccessful Follow-up Telephone Call  Date of discharge and from where:  02/25/22-[facility]  Attempts:  1st Attempt  Reason for unsuccessful TCM follow-up call:  Left voice message    Enzo Montgomery, RN,BSN,CCM [facility] Telephonic Care Management Coordinator Direct Phone: [phone] Toll Free: [phone] Fax: [phone]

## 2022-02-28 NOTE — Telephone Encounter (Signed)
Patient Name: Rebecca [name] Gender: Female DOB: 1933-04-02 Age: 88 Y 17 D Return Phone Number: [phone] (Primary) Address: City/ State/ Zip: [place]  [address] Client [place] [facility] [place] Client Site [place] [facility] [place] Night Provider Garret Reddish- MD Contact Type Call Who Is Calling Patient / Member / Family / Caregiver Call Type Triage / Clinical Relationship To Patient Self Return Phone Number [phone] (Primary) Chief Complaint Urination Pain Reason for Call Symptomatic / Request for [place] states is having urinary pain lower abdomen, knows she has an infection. Translation No Nurse Assessment Nurse: Ottis Stain, RN, Sherrie Date/Time (Eastern Time): 02/25/2022 9:37:44 AM Confirm and document reason for call. If symptomatic, describe symptoms. ---Caller states having pain in lower abd, No burning, +frequency, +urgency. No fever. Symptoms started Wednesday. +fluids, +UOP in last 8 hrs. Able to pee more than a few drops. Does the patient have any new or worsening symptoms? ---Yes Will a triage be completed? ---Yes Related visit to physician within the last 2 weeks? ---No Does the PT have any chronic conditions? (i.e. diabetes, asthma, this includes High risk factors for pregnancy, etc.) ---Yes List chronic conditions. ---HTN, Leukemia, Is this a behavioral health or substance abuse call? ---No Guidelines Guideline Title Affirmed Question Affirmed Notes Nurse Date/Time (Eastern Time) Urinary Symptoms Urinating more frequently than usual (i.e., frequency) Ottis Stain, RN, [place] 02/25/2022 9:40:53 AM Disp. Time Eilene Ghazi Time) Disposition Final User 02/25/2022 9:44:37 AM See PCP within 24 Hours Yes Ottis Stain, RN, [place] Final Disposition 02/25/2022 9:44:37 AM See PCP within 24 Hours Yes Ottis Stain, RN, Sherrie Caller Disagree/Comply Comply Caller Understands Yes PreDisposition  Call Doctor Care Advice Given Per Guideline SEE PCP WITHIN 24 HOURS: * IF OFFICE WILL BE CLOSED: You need to be seen within the next 24 hours. A clinic or an urgent care center is often a good source of care if your doctor's office is closed or you can't get an appointment. DRINK PLENTY OF LIQUIDS: * Drink plenty of liquids. It is important to stay well-hydrated. CALL BACK IF: * Fever occurs * Unable to urinate and bladder feels full * You become worse Referrals [facility] [place] [facility] [place]

## 2022-02-28 NOTE — Telephone Encounter (Signed)
Pt was seen in [facility] on 02/25/22  Patient Name: Rebecca [name] Gender: Female DOB: 1932/11/13 Age: 88 Y 6 D Return Phone Number: [phone] (Primary), [phone] (Secondary) Address: City/ State/ Zip: [place]  [address] Client [place] [facility] [place] Client Site [place] [facility] [place] Night Provider Garret Reddish- MD Contact Type Call Who Is Calling Patient / Member / Family / Caregiver Call Type Triage / Clinical Caller Name Iona Stay Relationship To Patient Self Return Phone Number [phone] (Primary) Chief Complaint SEVERE ABDOMINAL PAIN - Severe pain in abdomen Reason for Call Medication Question / Request Initial Comment Caller states she has a bad urinary infection. She is having pain in her lower abdomen. This is her second call. She was advised to go to urgent care and is declining. She would like an antibiotic. Translation No Nurse Assessment Nurse: Carlis Abbott, RN, Almyra Free Date/Time Eilene Ghazi Time): 02/25/2022 1:41:28 PM Confirm and document reason for call. If symptomatic, describe symptoms. ---Caller states she has a bad urinary infection. She is having pain in her lower abdomen. This is her second call. She was advised to go to urgent care and is declining. She would like an antibiotic. pt states pain/symptoms not worse. Does the patient have any new or worsening symptoms? ---No Disp. Time Eilene Ghazi Time) Disposition Final User 02/25/2022 1:40:01 PM Send to [facility]gent [place] 02/25/2022 1:52:18 PM Called On-Call Provider Carlis Abbott, RN, Almy[place] Free 02/25/2022 1:53:14 PM Called On-Call Provider Carlis Abbott, RN, Almyra Free Reason: on call provider agrees that patient needs to be seen in [facility] 02/25/2022 1:56:20 PM Clinical Call Yes Carlis Abbott, RN, Almyra Free Final Disposition 02/25/2022 1:56:20 PM Clinical Call Yes Carlis Abbott, RN, Almyra Free Comments User: Lanae Boast, RN Date/Time Eilene Ghazi Time): 02/25/2022 1:56:02 PM spoke  with daughter and told her that on call provider agrees that she needs to go to urgent care. she is going to take pt to urgent care. Paging DoctorName Phone DateTime Result/ Outcome Message Type Notes Lodema Pilot [phone] 02/25/2022 1:52:18 PM Called On Call Provider - Reached Doctor Paged Lodema Pilot 02/25/2022 1:52:40 PM Spoke with On Call - General Message Result on call agrees that patient needs to be seen, she cannot call in antibiotics.

## 2022-03-01 ENCOUNTER — Telehealth: Payer: Self-pay

## 2022-03-01 DIAGNOSIS — H401113 Primary open-angle glaucoma, right eye, severe stage: Secondary | ICD-10-CM | POA: Diagnosis not present

## 2022-03-01 DIAGNOSIS — H401122 Primary open-angle glaucoma, left eye, moderate stage: Secondary | ICD-10-CM | POA: Diagnosis not present

## 2022-03-01 NOTE — Patient Outreach (Signed)
  Care Coordination TOC Note Transition Care Management Follow-up Telephone Call Date of discharge and from where: 02/25/22-[facility] ED Dx: lower UTI How have you been since you were released from the hospital? Patient reports she is doing okay-still not 100% better. She voices urine is clear and she has no pain with urination. Appetite been fair. Patient reports she has hx of recurrent UTIs but it had been a while since she had one. She is considering calling her old urology office to be seen.  Any questions or concerns? Yes-Patient wanting to know name of abx she was given while in the ED and provided her with this info.  Items Reviewed: Did the pt receive and understand the discharge instructions provided? Yes  Medications obtained and verified? Yes  Other? Yes  Any new allergies since your discharge? No  Dietary orders reviewed? Yes Do you have support at home? Yes   Home Care and Equipment/Supplies: Were home health services ordered? not applicable If so, what is the name of the agency? N/A  Has the agency set up a time to come to the patient's home? not applicable Were any new equipment or medical supplies ordered?  No What is the name of the medical supply agency? N/A Were you able to get the supplies/equipment? not applicable Do you have any questions related to the use of the equipment or supplies? No  Functional Questionnaire: (I = Independent and D = Dependent) ADLs: I  Bathing/Dressing- I  Meal Prep- I  Eating- I  Maintaining continence- I  Transferring/Ambulation- I  Managing Meds- I  Follow up appointments reviewed:  PCP Hospital f/u appt confirmed? Yes  Scheduled to see Dr. Yong Channel on 03/17/22 @ 11:20am. [facility] f/u appt confirmed?  N/A  . Are transportation arrangements needed? No  If their condition worsens, is the pt aware to call PCP or go to the Emergency Dept.? Yes Was the patient provided with contact information for the PCP's office or  ED? Yes Was to pt encouraged to call back with questions or concerns? Yes  SDOH assessments and interventions completed:   Yes  Care Coordination Interventions Activated:  Yes   Care Coordination Interventions:  Education provided    Encounter Outcome:  Pt. Visit Completed    Enzo Montgomery, RN,BSN,CCM [facility] Management Telephonic Care Management Coordinator Direct Phone: [phone] Toll Free: [phone] Fax: [phone]

## 2022-03-03 LAB — CULTURE, BLOOD (ROUTINE X 2)
Culture: NO GROWTH
Special Requests: ADEQUATE

## 2022-03-07 DIAGNOSIS — Z4689 Encounter for fitting and adjustment of other specified devices: Secondary | ICD-10-CM | POA: Diagnosis not present

## 2022-03-07 DIAGNOSIS — R300 Dysuria: Secondary | ICD-10-CM | POA: Diagnosis not present

## 2022-03-12 ENCOUNTER — Other Ambulatory Visit: Payer: Self-pay | Admitting: Family Medicine

## 2022-03-14 DIAGNOSIS — R102 Pelvic and perineal pain: Secondary | ICD-10-CM | POA: Diagnosis not present

## 2022-03-14 DIAGNOSIS — N3000 Acute cystitis without hematuria: Secondary | ICD-10-CM | POA: Diagnosis not present

## 2022-03-16 ENCOUNTER — Other Ambulatory Visit: Payer: Self-pay

## 2022-03-16 ENCOUNTER — Emergency Department (HOSPITAL_COMMUNITY)
Admission: EM | Admit: 2022-03-16 | Discharge: 2022-03-16 | Disposition: A | Discharge status: Home / Self Care | Payer: Medicare Other | Attending: Student | Admitting: Student

## 2022-03-16 ENCOUNTER — Encounter (HOSPITAL_COMMUNITY): Payer: Self-pay

## 2022-03-16 DIAGNOSIS — R350 Frequency of micturition: Secondary | ICD-10-CM | POA: Insufficient documentation

## 2022-03-16 DIAGNOSIS — D72829 Elevated white blood cell count, unspecified: Secondary | ICD-10-CM | POA: Diagnosis not present

## 2022-03-16 DIAGNOSIS — R110 Nausea: Secondary | ICD-10-CM | POA: Diagnosis not present

## 2022-03-16 DIAGNOSIS — R300 Dysuria: Secondary | ICD-10-CM | POA: Insufficient documentation

## 2022-03-16 DIAGNOSIS — I10 Essential (primary) hypertension: Secondary | ICD-10-CM | POA: Diagnosis not present

## 2022-03-16 DIAGNOSIS — Z79899 Other long term (current) drug therapy: Secondary | ICD-10-CM | POA: Diagnosis not present

## 2022-03-16 DIAGNOSIS — R1030 Lower abdominal pain, unspecified: Secondary | ICD-10-CM | POA: Insufficient documentation

## 2022-03-16 LAB — URINALYSIS, ROUTINE W REFLEX MICROSCOPIC
Bilirubin Urine: NEGATIVE
Glucose, UA: NEGATIVE mg/dL
Hgb urine dipstick: NEGATIVE
Ketones, ur: NEGATIVE mg/dL
Nitrite: NEGATIVE
Protein, ur: NEGATIVE mg/dL
Specific Gravity, Urine: 1.008 (ref 1.005–1.030)
pH: 7 (ref 5.0–8.0)

## 2022-03-16 LAB — CBC WITH DIFFERENTIAL/PLATELET
Abs Immature Granulocytes: 0.05 10*3/uL (ref 0.00–0.07)
Basophils Absolute: 0.1 10*3/uL (ref 0.0–0.1)
Basophils Relative: 0 %
Eosinophils Absolute: 0.1 10*3/uL (ref 0.0–0.5)
Eosinophils Relative: 0 %
HCT: 35.5 % — ABNORMAL LOW (ref 36.0–46.0)
Hemoglobin: 12 g/dL (ref 12.0–15.0)
Immature Granulocytes: 0 %
Lymphocytes Relative: 82 %
Lymphs Abs: 25.2 10*3/uL — ABNORMAL HIGH (ref 0.7–4.0)
MCH: 34.6 pg — ABNORMAL HIGH (ref 26.0–34.0)
MCHC: 33.8 g/dL (ref 30.0–36.0)
MCV: 102.3 fL — ABNORMAL HIGH (ref 80.0–100.0)
Monocytes Absolute: 1.9 10*3/uL — ABNORMAL HIGH (ref 0.1–1.0)
Monocytes Relative: 6 %
Neutro Abs: 3.8 10*3/uL (ref 1.7–7.7)
Neutrophils Relative %: 12 %
Platelets: 158 10*3/uL (ref 150–400)
RBC: 3.47 MIL/uL — ABNORMAL LOW (ref 3.87–5.11)
RDW: 13.6 % (ref 11.5–15.5)
WBC Morphology: ABNORMAL
WBC: 31.1 10*3/uL — ABNORMAL HIGH (ref 4.0–10.5)
nRBC: 0 % (ref 0.0–0.2)

## 2022-03-16 LAB — COMPREHENSIVE METABOLIC PANEL
ALT: 16 U/L (ref 0–44)
AST: 20 U/L (ref 15–41)
Albumin: 3.9 g/dL (ref 3.5–5.0)
Alkaline Phosphatase: 54 U/L (ref 38–126)
Anion gap: 7 (ref 5–15)
BUN: 13 mg/dL (ref 8–23)
CO2: 22 mmol/L (ref 22–32)
Calcium: 9.8 mg/dL (ref 8.9–10.3)
Chloride: 101 mmol/L (ref 98–111)
Creatinine, Ser: 0.81 mg/dL (ref 0.44–1.00)
GFR, Estimated: >60 mL/min (ref >60)
Glucose, Bld: 116 mg/dL — ABNORMAL HIGH (ref 70–99)
Potassium: 4.4 mmol/L (ref 3.5–5.1)
Sodium: 130 mmol/L — ABNORMAL LOW (ref 135–145)
Total Bilirubin: 0.7 mg/dL (ref 0.3–1.2)
Total Protein: 6.1 g/dL — ABNORMAL LOW (ref 6.5–8.1)

## 2022-03-16 MED ORDER — SODIUM CHLORIDE 0.9 % IV SOLN
2.0000 g | Freq: Once | INTRAVENOUS | Status: AC
  Administered 2022-03-16: 2 g via INTRAVENOUS
  Filled 2022-03-16: qty 20

## 2022-03-16 MED ORDER — PHENAZOPYRIDINE HCL 200 MG PO TABS
200.0000 mg | ORAL_TABLET | Freq: Once | ORAL | Status: AC
  Administered 2022-03-16: 200 mg via ORAL
  Filled 2022-03-16: qty 1

## 2022-03-16 MED ORDER — LOPERAMIDE HCL 2 MG PO CAPS: 2.0000 mg | ORAL_CAPSULE | Freq: Four times a day (QID) | ORAL | 0 refills | Status: AC | PRN

## 2022-03-16 MED ORDER — ONDANSETRON 4 MG PO TBDP: 4.0000 mg | ORAL_TABLET | Freq: Three times a day (TID) | ORAL | 0 refills | Status: AC | PRN

## 2022-03-16 MED ORDER — CEFPODOXIME PROXETIL 100 MG PO TABS: 100.0000 mg | ORAL_TABLET | Freq: Two times a day (BID) | ORAL | 0 refills | Status: AC

## 2022-03-16 MED ORDER — PHENAZOPYRIDINE HCL 200 MG PO TABS: 200.0000 mg | ORAL_TABLET | Freq: Three times a day (TID) | ORAL | 0 refills | Status: AC

## 2022-03-16 MED ORDER — ONDANSETRON HCL 4 MG/2ML IJ SOLN
4.0000 mg | Freq: Once | INTRAMUSCULAR | Status: AC
  Administered 2022-03-16: 4 mg via INTRAVENOUS
  Filled 2022-03-16: qty 2

## 2022-03-16 NOTE — ED Provider Notes (Signed)
[facility] DEPT Provider Note  CSN: [id number] Arrival date & time: 03/16/22 9373  Chief Complaint(s) No chief complaint on file.  HPI Kaylynn Chamblin Bensman is a 88 y.o. female with PMH CLL, gastroparesis, HTN, HLD, IBS, frequent UTIs who presents emergency department for evaluation of incomplete bladder emptying, dysuria, frequency and nausea.  Patient first started to develop symptoms in late November 2023 where she was seen here in the emergency department and discharged after single dose fosfomycin.  She followed back up on 03/07/2022 with her OB/GYN who prescribed Macrobid and ultimately obtained a urine culture that showed no growth.  She followed up with her urologist at [facility] urology who was attempting to track down this urine culture data and scheduled follow-up for 1 week from today.  She states that immediately upon finishing her Macrobid yesterday, her symptoms returned that woke her from sleep this morning with dysuria, increased frequency and nausea.  She states that she feels like she cannot fully empty her bladder.  Denies chest pain, shortness of breath, headache, fever or other systemic symptoms.   Past Medical History Past Medical History:  Diagnosis Date   Anemia    Anxiety    Bifascicular block    Diverticulitis    Diverticulosis    Gastroparesis    GERD (gastroesophageal reflux disease)    Glaucoma    Hemorrhoids    Hyperlipidemia    Hypertension    Hypertension    IBS (irritable bowel syndrome)    Rectal ulcer    SCOLIOSIS, LUMBAR SPINE 03/09/2010   Qualifier: Diagnosis of  By: Arnoldo Morale MD, John E    Segmental colitis [facility])    Tubular adenoma of colon 05/2009   Patient Active Problem List   Diagnosis Date Noted   CLL (chronic lymphocytic leukemia) ([place]) 09/30/2021   Cholangitis 03/26/2021   Cholelithiasis 03/25/2021   Elevated LFTs 03/25/2021   Chronic hyponatremia 03/25/2021   Bilateral primary osteoarthritis of knee  06/11/2019   Pain in left shoulder 05/22/2019   Unspecified vitamin D deficiency 12/25/2013   SPINAL STENOSIS, LUMBAR 03/09/2010   Osteoporosis 03/09/2010   MIGRAINE, OPHTHALMIC 10/29/2009   ADENOMATOUS COLONIC POLYP 06/10/2009   History of iron deficiency 05/06/2009   Carotid artery disease ([place]) 07/28/2008   Allergic rhinitis 03/20/2008   DYSPHAGIA 03/12/2008   Ganglion of tendon sheath 08/17/2007   Hypothyroidism 11/15/2006   Hyperlipidemia 11/07/2006   Essential hypertension 10/18/2006   GERD 10/18/2006   DIVERTICULOSIS, COLON 10/18/2006   Home Medication(s) Prior to Admission medications   Medication Sig Start Date End Date Taking? Authorizing Provider  acetaminophen (TYLENOL) 500 MG tablet Take 500-1,000 mg by mouth every 6 (six) hours as needed for mild pain or headache.    [provider]  ALPRAZolam (XANAX) 0.25 MG tablet TAKE 1 TABLET BY MOUTH AT BEDTIME AS NEEDED. DO NOT DRIVE FOR 8 HOURS AFTER TAKING AND STOP IF ANY FALLS UNTIL NEXT DOCTOR VISIT 03/14/22   Marin Olp, MD  Calcium Carb-Cholecalciferol (CALCIUM-VITAMIN D) 600-400 MG-UNIT TABS Take 1 tablet by mouth daily.    [provider]  Cholecalciferol (VITAMIN D3) 2000 UNITS TABS Take 1 tablet by mouth daily.    [provider]  diclofenac sodium (VOLTAREN) 1 % GEL Apply 2 g topically 4 (four) times daily. Patient taking differently: Apply 2 g topically 4 (four) times daily as needed (for pain). 08/09/17   Burchette, Alinda Sierras, MD  esomeprazole (NEXIUM) 20 MG capsule Take 20 mg by mouth 2 (two)  times daily before a meal.    [provider]  estradiol (ESTRACE) 0.1 MG/GM vaginal cream Place 0.5 g vaginally See admin instructions. Place 0.5 g vaginally two to three times a week 12/13/21   [provider]  levothyroxine (SYNTHROID) 88 MCG tablet Take 1 tablet (88 mcg total) by mouth daily before breakfast. 11/09/21   Marin Olp, MD  loperamide (IMODIUM) 2 MG capsule Take 2  capsules (4 mg total) by mouth every 6 (six) hours as needed for diarrhea or loose stools. 04/01/21   Antonieta Pert, MD  losartan (COZAAR) 100 MG tablet Take 1 tablet (100 mg total) by mouth daily. 09/30/21   Marin Olp, MD  metoCLOPramide (REGLAN) 5 MG tablet TAKE 1 TABLET BY MOUTH TWICE DAILY WITH MEALS Patient taking differently: Take 5 mg by mouth 2 (two) times daily with a meal. 12/24/21   Burchette, Alinda Sierras, MD  nystatin-triamcinolone (MYCOLOG II) cream Apply 1 Application topically See admin instructions. Apply to the affected area 2 times a week as needed for irritation    [provider]  rosuvastatin (CRESTOR) 20 MG tablet TAKE 1 TABLET(20 MG) BY MOUTH DAILY Patient not taking: Reported on 02/25/2022 06/25/21   Marin Olp, MD  timolol (TIMOPTIC) 0.5 % ophthalmic solution Place 1 drop into both eyes 2 (two) times daily. 09/08/20   [provider]  vitamin B-12 (CYANOCOBALAMIN) 500 MCG tablet Take 500 mcg by mouth every morning.    [provider]  vitamin C (ASCORBIC ACID) 500 MG tablet Take 500 mg by mouth daily.    [provider]  VYZULTA 0.024 % SOLN Place 1 drop into both eyes at bedtime.    [provider]  olmesartan (BENICAR) 20 MG tablet Take 20 mg by mouth daily.    07/28/11  [provider]                                                                                                                                    Past Surgical History Past Surgical History:  Procedure Laterality Date   APPENDECTOMY     ARCUATE KERATECTOMY     BILIARY DILATION  03/27/2021   Procedure: BILIARY DILATION;  Surgeon: [name], MD;  Location: [facility];  Service: Gastroenterology;;   CATARACT EXTRACTION, BILATERAL  2015   CHOLECYSTECTOMY N/A 03/28/2021   Procedure: LAPAROSCOPIC CHOLECYSTECTOMY;  Surgeon: Leighton Ruff, MD;  Location: [facility];  Service: General;  Laterality: N/A;   double pallital tori bone removed       ERCP N/A 03/27/2021   Procedure: ENDOSCOPIC RETROGRADE CHOLANGIOPANCREATOGRAPHY (ERCP);  Surgeon: [name], MD;  Location: [facility];  Service: Gastroenterology;  Laterality: N/A;   HERNIA REPAIR     umbilical   KNEE SURGERY Bilateral    morton's neuroma on rt foot and platar facial release     MOUTH SURGERY     "bone shaved  off roof of mouth"   REMOVAL OF STONES  03/27/2021   Procedure: REMOVAL OF STONES;  Surgeon: [name], MD;  Location: [facility];  Service: Gastroenterology;;   SALIVARY GLAND SURGERY     removal   SPHINCTEROTOMY  03/27/2021   Procedure: SPHINCTEROTOMY;  Surgeon: [name], MD;  Location: [facility];  Service: Gastroenterology;;   TONSILLECTOMY     Family History Family History  Problem Relation Age of Onset   Diabetes Mother    Heart disease Mother    Hyperlipidemia Mother    Hypertension Mother    Anuerysm Father        abdominal- heavy smoker   Heart disease Father    Hypertension Father    Colon polyps Sister    Lung cancer Sister        smoker   Colon polyps Brother    Diabetes Brother    Hyperlipidemia Brother    Hypertension Brother    Heart disease Brother    Colon cancer Paternal Grandmother        with possible stomach cancer   Migraines Daughter    Kidney Stones Daughter    Colon polyps Daughter    Breast cancer Daughter    Cancer Daughter        salivary   Colon cancer Maternal Aunt        Rectal cancer   Crohn's disease Neg Hx    Pancreatic cancer Neg Hx     Social History Social History   Tobacco Use   Smoking status: Never   Smokeless tobacco: Never  Vaping Use   Vaping Use: Never used  Substance Use Topics   Alcohol use: Yes    Alcohol/week: 0.0 standard drinks of alcohol    Comment: rare   Drug use: No   Allergies Levaquin [levofloxacin], Sulfamethoxazole-trimethoprim, Lactose intolerance (gi), Tramadol hcl, Augmentin [amoxicillin-pot clavulanate], Ciprofloxacin, Rosuvastatin, Zosyn  [piperacillin sod-tazobactam so], Adhesive [tape], Codeine, and Pneumococcal vaccine polyvalent  Review of Systems Review of Systems  Genitourinary:  Positive for difficulty urinating, dysuria and urgency.    Physical Exam Vital Signs  I have reviewed the triage vital signs BP (!) 142/76   Pulse (!) 58   Temp 98.8 F (37.1 C) (Oral)   Resp 17   SpO2 97%   Physical Exam Vitals and nursing note reviewed.  Constitutional:      General: She is not in acute distress.    Appearance: She is well-developed.  HENT:     Head: Normocephalic and atraumatic.  Eyes:     Conjunctiva/sclera: Conjunctivae normal.  Cardiovascular:     Rate and Rhythm: Normal rate and regular rhythm.     Heart sounds: No murmur heard. Pulmonary:     Effort: Pulmonary effort is normal. No respiratory distress.     Breath sounds: Normal breath sounds.  Abdominal:     Palpations: Abdomen is soft.     Tenderness: There is abdominal tenderness.  Musculoskeletal:        General: No swelling.     Cervical back: Neck supple.  Skin:    General: Skin is warm and dry.     Capillary Refill: Capillary refill takes less than 2 seconds.  Neurological:     Mental Status: She is alert.  Psychiatric:        Mood and Affect: Mood normal.     ED Results and Treatments Labs (all labs ordered are listed, but only abnormal results are displayed) Labs Reviewed  URINALYSIS,  ROUTINE W REFLEX MICROSCOPIC - Abnormal; Notable for the following components:      Result Value   Color, Urine STRAW (*)    Leukocytes,Ua LARGE (*)    Bacteria, UA RARE (*)    All other components within normal limits  CBC WITH DIFFERENTIAL/PLATELET - Abnormal; Notable for the following components:   WBC 31.1 (*)    RBC 3.47 (*)    HCT 35.5 (*)    MCV 102.3 (*)    MCH 34.6 (*)    All other components within normal limits  URINE CULTURE  COMPREHENSIVE METABOLIC PANEL                                                                                                                           Radiology No results found.  Pertinent labs & imaging results that were available during my care of the patient were reviewed by me and considered in my medical decision making (see MDM for details).  Medications Ordered in ED Medications  ondansetron (ZOFRAN) injection 4 mg (4 mg Intravenous Given 03/16/22 1030)                                                                                                                                     Procedures Ultrasound ED Abd  Date/Time: 03/16/2022 11:32 AM  Performed by: Teressa Lower, MD Authorized by: Teressa Lower, MD   Procedure details:    Indications: decreased urinary output     Bladder:  Visualized        Bladder findings:    Free pelvic fluid: not identified     Volume:  165    (including critical care time)  Medical Decision Making / ED Course   This patient presents to the ED for concern of dysuria, nausea, this involves an extensive number of treatment options, and is a complaint that carries with it a high risk of complications and morbidity.  The differential diagnosis includes UTI, interstitial cystitis, urinary obstruction, intra-abdominal infection  MDM: Patient seen the emergency room for evaluation of dysuria and nausea.  Physical exam with mild tenderness in the suprapubic region but is otherwise unremarkable.  Bedside ultrasound showing approximately 165 cc of retained urine as a PVR.  Laboratory evaluation with a leukocytosis to 31.1 with a lymphocytic predominance consistent with her known history of CLL and is similar to labs obtained 2 weeks ago.  There is also a mild hyponatremia at 130 which was also seen previously.  Urinalysis with large leuk esterase, 6-10 white blood cells but rare bacteria and urine culture sent.  Patient empirically started on ceftriaxone given failure of Macrobid and fosfomycin and after a dose of Zofran her symptoms significant  proved.  I spoke with the urologist on-call Dr. Alinda Money who is recommending suppressive antibiotics until urology follow-up in 1 week which is already established and they will follow-up culture data.  Patient was tolerating ceftriaxone well here in the emergency department and she will be discharged on Vantin until urology follow-up and was given prescriptions for Imodium and Zofran for medication symptom control and a prescription for Pyridium was sent to the pharmacy as well.  Patient given return precautions and both her and her daughter voiced understanding.   Additional history obtained: -Additional history obtained from daughter -External records from outside source obtained and reviewed including: Chart review including previous notes, labs, imaging, consultation notes   Lab Tests: -I ordered, reviewed, and interpreted labs.   The pertinent results include:   Labs Reviewed  URINALYSIS, ROUTINE W REFLEX MICROSCOPIC - Abnormal; Notable for the following components:      Result Value   Color, Urine STRAW (*)    Leukocytes,Ua LARGE (*)    Bacteria, UA RARE (*)    All other components within normal limits  CBC WITH DIFFERENTIAL/PLATELET - Abnormal; Notable for the following components:   WBC 31.1 (*)    RBC 3.47 (*)    HCT 35.5 (*)    MCV 102.3 (*)    MCH 34.6 (*)    All other components within normal limits  URINE CULTURE  COMPREHENSIVE METABOLIC PANEL      Medicines ordered and prescription drug management: Meds ordered this encounter  Medications   ondansetron (ZOFRAN) injection 4 mg    -I have reviewed the patients home medicines and have made adjustments as needed  Critical interventions none  Consultations Obtained: I requested consultation with the urologist on-call Dr. Alinda Money,  and discussed lab and imaging findings as well as pertinent plan - they recommend: Suppressive antibiotics and outpatient follow-up   Cardiac Monitoring: The patient was maintained on a  cardiac monitor.  I personally viewed and interpreted the cardiac monitored which showed an underlying rhythm of: NSR  Social Determinants of Health:  Factors impacting patients care include: none   Reevaluation: After the interventions noted above, I reevaluated the patient and found that they have :improved  Co morbidities that complicate the patient evaluation  Past Medical History:  Diagnosis Date   Anemia    Anxiety    Bifascicular block    Diverticulitis    Diverticulosis    Gastroparesis    GERD (gastroesophageal reflux disease)    Glaucoma    Hemorrhoids    Hyperlipidemia    Hypertension    Hypertension    IBS (irritable bowel syndrome)    Rectal ulcer    SCOLIOSIS, LUMBAR SPINE 03/09/2010   Qualifier: Diagnosis of  By: Arnoldo Morale MD, John E    Segmental colitis [facility])    Tubular adenoma of colon 05/2009      Dispostion: I considered admission for this patient, and we did discuss admission for persistent UTI and failure of outpatient antibiotics but after discussion with urology and shared decision making in the room, we decided on suppressive outpatient oral antibiotics and urology follow-up.  Return precautions given and pt discharged with urology follow-up.     Final  Clinical Impression(s) / ED Diagnoses Final diagnoses:  None     @PCDICTATION @    Teressa Lower, MD 03/16/22 1137

## 2022-03-16 NOTE — ED Triage Notes (Signed)
[facility] reports pt coming from home for urine retention and spasm. Recent UTI which she is being treated for.

## 2022-03-16 NOTE — ED Notes (Signed)
As per Dr. Tinnie Gens, pt had an ultra sound and does not need the bladder scan

## 2022-03-17 ENCOUNTER — Encounter: Payer: Self-pay | Admitting: *Deleted

## 2022-03-17 ENCOUNTER — Ambulatory Visit: Payer: Medicare Other | Admitting: Family Medicine

## 2022-03-17 LAB — URINE CULTURE: Culture: <10000 — AB

## 2022-03-22 DIAGNOSIS — N3020 Other chronic cystitis without hematuria: Secondary | ICD-10-CM | POA: Diagnosis not present

## 2022-03-22 DIAGNOSIS — N3000 Acute cystitis without hematuria: Secondary | ICD-10-CM | POA: Diagnosis not present

## 2022-03-25 ENCOUNTER — Ambulatory Visit: Payer: Medicare Other | Admitting: Family Medicine

## 2022-03-25 DIAGNOSIS — Z4689 Encounter for fitting and adjustment of other specified devices: Secondary | ICD-10-CM | POA: Diagnosis not present

## 2022-03-25 DIAGNOSIS — N8110 Cystocele, unspecified: Secondary | ICD-10-CM | POA: Diagnosis not present

## 2022-03-25 DIAGNOSIS — N898 Other specified noninflammatory disorders of vagina: Secondary | ICD-10-CM | POA: Diagnosis not present

## 2022-03-25 DIAGNOSIS — N816 Rectocele: Secondary | ICD-10-CM | POA: Diagnosis not present

## 2022-03-25 DIAGNOSIS — N390 Urinary tract infection, site not specified: Secondary | ICD-10-CM | POA: Diagnosis not present

## 2022-04-05 ENCOUNTER — Ambulatory Visit (INDEPENDENT_AMBULATORY_CARE_PROVIDER_SITE_OTHER): Payer: Medicare Other | Admitting: Family Medicine

## 2022-04-05 ENCOUNTER — Encounter: Payer: Self-pay | Admitting: Family Medicine

## 2022-04-05 VITALS — BP 138/70 | HR 54 | Temp 98.0°F | Ht 65.0 in | Wt 118.6 lb

## 2022-04-05 DIAGNOSIS — Z79899 Other long term (current) drug therapy: Secondary | ICD-10-CM

## 2022-04-05 DIAGNOSIS — I779 Disorder of arteries and arterioles, unspecified: Secondary | ICD-10-CM

## 2022-04-05 DIAGNOSIS — E559 Vitamin D deficiency, unspecified: Secondary | ICD-10-CM | POA: Diagnosis not present

## 2022-04-05 DIAGNOSIS — I10 Essential (primary) hypertension: Secondary | ICD-10-CM

## 2022-04-05 DIAGNOSIS — E871 Hypo-osmolality and hyponatremia: Secondary | ICD-10-CM | POA: Diagnosis not present

## 2022-04-05 DIAGNOSIS — C9110 Chronic lymphocytic leukemia of B-cell type not having achieved remission: Secondary | ICD-10-CM

## 2022-04-05 DIAGNOSIS — M791 Myalgia, unspecified site: Secondary | ICD-10-CM

## 2022-04-05 DIAGNOSIS — E785 Hyperlipidemia, unspecified: Secondary | ICD-10-CM | POA: Diagnosis not present

## 2022-04-05 DIAGNOSIS — E039 Hypothyroidism, unspecified: Secondary | ICD-10-CM

## 2022-04-05 LAB — CBC WITH DIFFERENTIAL/PLATELET
Basophils Absolute: 0.1 10*3/uL (ref 0.0–0.1)
Basophils Relative: 0.4 % (ref 0.0–3.0)
Eosinophils Absolute: 0.1 10*3/uL (ref 0.0–0.7)
Eosinophils Relative: 0.4 % (ref 0.0–5.0)
HCT: 36.3 % (ref 36.0–46.0)
Hemoglobin: 12.2 g/dL (ref 12.0–15.0)
Lymphocytes Relative: 86.6 % — ABNORMAL HIGH (ref 12.0–46.0)
Lymphs Abs: 28 10*3/uL — ABNORMAL HIGH (ref 0.7–4.0)
MCHC: 33.6 g/dL (ref 30.0–36.0)
MCV: 103.1 fl — ABNORMAL HIGH (ref 78.0–100.0)
Monocytes Absolute: 0.7 10*3/uL (ref 0.1–1.0)
Monocytes Relative: 2.3 % — ABNORMAL LOW (ref 3.0–12.0)
Neutro Abs: 3.3 10*3/uL (ref 1.4–7.7)
Neutrophils Relative %: 10.3 % — ABNORMAL LOW (ref 43.0–77.0)
Platelets: 214 10*3/uL (ref 150.0–400.0)
RBC: 3.52 Mil/uL — ABNORMAL LOW (ref 3.87–5.11)
RDW: 13.7 % (ref 11.5–15.5)
WBC: 32.3 10*3/uL (ref 4.0–10.5)

## 2022-04-05 LAB — COMPREHENSIVE METABOLIC PANEL
ALT: 12 U/L (ref 0–35)
AST: 18 U/L (ref 0–37)
Albumin: 4.1 g/dL (ref 3.5–5.2)
Alkaline Phosphatase: 66 U/L (ref 39–117)
BUN: 21 mg/dL (ref 6–23)
CO2: 24 mEq/L (ref 19–32)
Calcium: 9.9 mg/dL (ref 8.4–10.5)
Chloride: 96 mEq/L (ref 96–112)
Creatinine, Ser: 0.96 mg/dL (ref 0.40–1.20)
GFR: 53.34 mL/min — ABNORMAL LOW (ref >60.00)
Glucose, Bld: 105 mg/dL — ABNORMAL HIGH (ref 70–99)
Potassium: 4.4 mEq/L (ref 3.5–5.1)
Sodium: 131 mEq/L — ABNORMAL LOW (ref 135–145)
Total Bilirubin: 0.5 mg/dL (ref 0.2–1.2)
Total Protein: 5.9 g/dL — ABNORMAL LOW (ref 6.0–8.3)

## 2022-04-05 LAB — LIPID PANEL
Cholesterol: 267 mg/dL — ABNORMAL HIGH (ref 0–200)
HDL: 47.7 mg/dL
LDL Cholesterol: 186 mg/dL — ABNORMAL HIGH (ref 0–99)
NonHDL: 219.07
Total CHOL/HDL Ratio: 6
Triglycerides: 165 mg/dL — ABNORMAL HIGH (ref 0.0–149.0)
VLDL: 33 mg/dL (ref 0.0–40.0)

## 2022-04-05 LAB — VITAMIN B12: Vitamin B-12: 190 pg/mL — ABNORMAL LOW (ref 211–911)

## 2022-04-05 LAB — TSH: TSH: 0.89 u[IU]/mL (ref 0.35–5.50)

## 2022-04-05 LAB — VITAMIN D 25 HYDROXY (VIT D DEFICIENCY, FRACTURES): VITD: 42.96 ng/mL (ref 30.00–100.00)

## 2022-04-05 NOTE — Addendum Note (Signed)
Addended by: Doran Clay A on: 04/05/2022 02:06 PM   Modules accepted: Orders

## 2022-04-05 NOTE — Patient Instructions (Addendum)
Health Maintenance Due  Topic Date Due   DTaP/Tdap/Td (3 - Tdap)- due at pharmacy 07/29/2018  - also let us know when you get rsv shot  Please stop by lab before you go If you have mychart- we will send your results within 3 business days of [place] receiving them.  If you do not have mychart- we will call you about results within 5 business days of [place] receiving them.  *please also note that you will see labs on mychart as soon as they post. I will later go in and write notes on them- will say "notes from Dr. Yong Channel"   We will call you within two weeks about your referral for carotid artery ultrasound. If you do not hear within 2 weeks, give [place] a call.   Recommended follow up: Return in about 6 months (around 10/04/2022) for followup or sooner if needed.Schedule b4 you leave.

## 2022-04-05 NOTE — Progress Notes (Signed)
Phone [phone] In person visit   Subjective:   Rebecca [name] is a 89 y.o. year old very pleasant female patient who presents for/with See problem oriented charting Chief Complaint  Patient presents with   Follow-up   Hyperlipidemia    Past Medical History-  Patient Active Problem List   Diagnosis Date Noted   CLL (chronic lymphocytic leukemia) ([place]) 09/30/2021    Priority: High   Carotid artery disease ([place]) 07/28/2008    Priority: High   Myalgia 04/05/2022    Priority: Medium    Chronic hyponatremia 03/25/2021    Priority: Medium    Vitamin D deficiency 12/25/2013    Priority: Medium    SPINAL STENOSIS, LUMBAR 03/09/2010    Priority: Medium    Osteoporosis 03/09/2010    Priority: Medium    MIGRAINE, OPHTHALMIC 10/29/2009    Priority: Medium    Hypothyroidism 11/15/2006    Priority: Medium    Hyperlipidemia 11/07/2006    Priority: Medium    Essential hypertension 10/18/2006    Priority: Medium    GERD 10/18/2006    Priority: Medium    Cholangitis 03/26/2021    Priority: Low   Cholelithiasis 03/25/2021    Priority: Low   Elevated LFTs 03/25/2021    Priority: Low   Bilateral primary osteoarthritis of knee 06/11/2019    Priority: Low   Pain in left shoulder 05/22/2019    Priority: Low   ADENOMATOUS COLONIC POLYP 06/10/2009    Priority: Low   History of iron deficiency 05/06/2009    Priority: Low   Allergic rhinitis 03/20/2008    Priority: Low   DYSPHAGIA 03/12/2008    Priority: Low   Ganglion of tendon sheath 08/17/2007    Priority: Low   DIVERTICULOSIS, COLON 10/18/2006    Priority: Low    Medications- reviewed and updated Current Outpatient Medications  Medication Sig Dispense Refill   ALPRAZolam (XANAX) 0.25 MG tablet TAKE 1 TABLET BY MOUTH AT BEDTIME AS NEEDED. DO NOT DRIVE FOR 8 HOURS AFTER TAKING AND STOP IF ANY FALLS UNTIL NEXT DOCTOR VISIT 20 tablet 0   Calcium Carb-Cholecalciferol (CALCIUM-VITAMIN D) 600-400 MG-UNIT TABS Take 1 tablet  by mouth daily.     Cholecalciferol (VITAMIN D3) 2000 UNITS TABS Take 1 tablet by mouth daily.     diclofenac sodium (VOLTAREN) 1 % GEL Apply 2 g topically 4 (four) times daily. (Patient taking differently: Apply 2 g topically 4 (four) times daily as needed (for pain).) 1 Tube 1   doxycycline (VIBRA-TABS) 100 MG tablet Take 100 mg by mouth 2 (two) times daily.     esomeprazole (NEXIUM) 20 MG capsule Take 20 mg by mouth 2 (two) times daily before a meal.     estradiol (ESTRACE) 0.1 MG/GM vaginal cream Place 0.5 g vaginally See admin instructions. Place 0.5 g vaginally two to three times a week     levothyroxine (SYNTHROID) 88 MCG tablet Take 1 tablet (88 mcg total) by mouth daily before breakfast. 90 tablet 3   loperamide (IMODIUM) 2 MG capsule Take 1 capsule (2 mg total) by mouth 4 (four) times daily as needed for diarrhea or loose stools. 12 capsule 0   losartan (COZAAR) 100 MG tablet Take 1 tablet (100 mg total) by mouth daily. 90 tablet 3   metoCLOPramide (REGLAN) 5 MG tablet TAKE 1 TABLET BY MOUTH TWICE DAILY WITH MEALS (Patient taking differently: Take 5 mg by mouth 2 (two) times daily with a meal.) 60 tablet 3   nitrofurantoin, macrocrystal-monohydrate, (MACROBID)  100 MG capsule Take 100 mg by mouth every 12 (twelve) hours.     nystatin-triamcinolone (MYCOLOG II) cream Apply 1 Application topically See admin instructions. Apply to the affected area 2 times a week as needed for irritation     ondansetron (ZOFRAN-ODT) 4 MG disintegrating tablet Take 1 tablet (4 mg total) by mouth every 8 (eight) hours as needed for nausea or vomiting. 20 tablet 0   rosuvastatin (CRESTOR) 20 MG tablet TAKE 1 TABLET(20 MG) BY MOUTH DAILY 30 tablet 5   timolol (TIMOPTIC) 0.5 % ophthalmic solution Place 1 drop into both eyes 2 (two) times daily.     vitamin B-12 (CYANOCOBALAMIN) 500 MCG tablet Take 500 mcg by mouth every morning.     vitamin C (ASCORBIC ACID) 500 MG tablet Take 500 mg by mouth daily.     VYZULTA  0.024 % SOLN Place 1 drop into both eyes at bedtime.     acetaminophen (TYLENOL) 500 MG tablet Take 500-1,000 mg by mouth every 6 (six) hours as needed for mild pain or headache. (Patient not taking: Reported on 04/05/2022)     No current facility-administered medications for this visit.     Objective:  BP 138/70   Pulse (!) 54   Temp 98 F (36.7 C)   Ht '5\' 5"'$  (1.651 m)   Wt 118 lb 9.6 oz (53.8 kg)   SpO2 97%   BMI 19.74 kg/m  Gen: NAD, resting comfortably CV: RRR no murmurs rubs or gallops- not bradycardic on my exam Lungs: CTAB no crackles, wheeze, rhonchi Ext: no edema Skin: warm, dry     Assessment and Plan   % CLL- follows with Dr. Lorie Apley last visit in September  # Bilateral knee arthritis-has had series of Orthovisc injections x 3 through August - still with intermittent issues  # Persistent UTI-seen in the emergency department and admission was considered on 03/16/2022 (final culture showed less than 10,000 colonies) but in consultation with urology was discharged with suppressive antibiotics and urology follow-up-patient reports still on doxycycline but is almost done nad has close follow up.    #hypertension S: medication: losartan '100mg'$ . Was higher in emergency room- orthostatic issues in the past BP Readings from Last 3 Encounters:  04/05/22 138/70  03/16/22 (!) 165/89  02/25/22 (!) 157/79   A/P: reasonable control for age- continue current medications    #hyperlipidemia # Carotid stenosis 1 to 39% on the right and left ICA, ECA greater than 50% in 2022 S: Medication: No Rx -Rosuvastatin 20 mg prescribed- had a gel/weakness sensation in calves when she took it and opted to stop- symptoms resolved - she states also had restless legs on the medication that resolved on it -weakness on lipitor in past Lab Results  Component Value Date   CHOL 132 08/21/2020   HDL 39.20 08/21/2020   LDLCALC 55 08/21/2020   LDLDIRECT 89.0 05/13/2020   TRIG 188.0 (H) 08/21/2020    CHOLHDL 3 08/21/2020  A/P: update liipds but does not tolerate statins- if LDL above 70 perhaps consider zetia which is a non statin   - update carotid US at this time  #hypothyroidism S: compliant On thyroid medication-levothyroxine 88 mcg Lab Results  Component Value Date   TSH 0.766 03/26/2021  A/P:hopefully stable- update tsh today. Continue current meds for now   # Osteoporosis #Vitamin D deficiency S: Last DEXA:  with GYN and has planned repeat Medication (bisphosphonate or prolia): 5 years fosamax through GYN in past  Calcium: '1200mg'$  (through diet  ok) recommended - takes Vitamin D: At least 1000 units a day recommended- -takes 2000 units Last vitamin D Lab Results  Component Value Date   VD25OH 45 03/20/2020  A/P: continue gyn follow up- reports stability    # GERD S:Medication: Nexium 20 mg -Takes empiric B12 with this in past- now off  A/P: stable- continue current medicines     #Lumbar stenosis with history of sciatica- working with Dr. Ellene Route- innjections   # Hyponatremia-oncology wanted an updated TSH.  We will extend labs to further evaluate as below - tsh per oncology   Recommended follow up: Return in about 6 months (around 10/04/2022) for followup or sooner if needed.Schedule b4 you leave. Future Appointments  Date Time Provider [place]  04/18/2022 11:00 AM [facility] CCM PHARMACIST [facility]  06/14/2022 12:30 PM [facility] None  06/14/2022  1:00 PM Brunetta Genera, MD [facility] None  01/05/2023 11:00 AM [facility] HEALTH COACH [facility]   Lab/Order associations: NOT fasting- breakfast bacon and eggs and coffee with sugar- finished 8 am- almost 6 hours out   ICD-10-CM   1. Essential hypertension  I10     2. Bilateral carotid artery disease, unspecified type (HCC)  I77.9 VAS US CAROTID    3. Hypothyroidism, unspecified type  E03.9 TSH    4. Hyperlipidemia, unspecified hyperlipidemia type  E78.5 CBC with  Differential/Platelet    Comprehensive metabolic panel    Lipid panel    5. Vitamin D deficiency  E55.9 VITAMIN D 25 Hydroxy (Vit-D Deficiency, Fractures)    6. Myalgia  M79.10     7. CLL (chronic lymphocytic leukemia) (HCC) Chronic C91.10     8. High risk medication use  Z79.899 Vitamin B12    9. Hyponatremia  E87.1 Sodium, urine, random    Osmolality, urine    Osmolality      No orders of the defined types were placed in this encounter.   Return precautions advised.  Garret Reddish, MD

## 2022-04-06 ENCOUNTER — Telehealth: Payer: Self-pay | Admitting: Family Medicine

## 2022-04-06 NOTE — Telephone Encounter (Signed)
Pt would like a call back with help with questions that she is supposed to answer through [place].

## 2022-04-07 LAB — SODIUM, URINE, RANDOM

## 2022-04-07 LAB — OSMOLALITY: Osmolality: 275 mOsm/kg — ABNORMAL LOW (ref 278–305)

## 2022-04-07 LAB — OSMOLALITY, URINE

## 2022-04-07 NOTE — Telephone Encounter (Signed)
Called and spoke with pt

## 2022-04-08 ENCOUNTER — Encounter: Payer: Self-pay | Admitting: Family Medicine

## 2022-04-14 ENCOUNTER — Other Ambulatory Visit: Payer: Self-pay

## 2022-04-14 DIAGNOSIS — D485 Neoplasm of uncertain behavior of skin: Secondary | ICD-10-CM | POA: Diagnosis not present

## 2022-04-14 DIAGNOSIS — L905 Scar conditions and fibrosis of skin: Secondary | ICD-10-CM | POA: Diagnosis not present

## 2022-04-14 DIAGNOSIS — Z85828 Personal history of other malignant neoplasm of skin: Secondary | ICD-10-CM | POA: Diagnosis not present

## 2022-04-14 MED ORDER — ATORVASTATIN CALCIUM 10 MG PO TABS: 10.0000 mg | ORAL_TABLET | ORAL | 3 refills | Status: DC

## 2022-04-18 ENCOUNTER — Telehealth: Payer: Medicare Other

## 2022-04-22 ENCOUNTER — Telehealth: Payer: Self-pay | Admitting: Pharmacist

## 2022-04-22 ENCOUNTER — Encounter: Payer: Medicare Other | Admitting: Pharmacist

## 2022-04-22 NOTE — Progress Notes (Signed)
Care Management & Coordination Services Pharmacy Team  Reason for Encounter: Appointment Reminder    Patient contacted to confirm telephone appointment with 04/25/2022 PharmD, on [name] at 12:00 pm.   Star Rating Drugs:  Atorvastatin 10 mg last filled 04/14/2022 90 DS Losartan 100 mg last filled 02/04/2022 90 DS Rosuvastatin 20 mg last filled 02/04/2022 90 DS  Care Gaps: Annual wellness visit in last year? Yes  April D Calhoun, [place] Pharmacist Assistant [phone]

## 2022-04-25 ENCOUNTER — Ambulatory Visit: Payer: Medicare Other | Admitting: Pharmacist

## 2022-04-25 NOTE — Progress Notes (Signed)
Care Management & Coordination Services Pharmacy Note  04/25/2022 Name:  Rebecca [name] MRN:  [id number] DOB:  Aug 20, 1932  Summary: PharmD Fu visit.  Patient LDL increased since she stopped statin.  Now taking lipitor once weekly.  She is tolerating, unsure if this will be enough to get her to goal but would rather go slow and her take something.  BP controlled after recent ED visits.  Recommendations/Changes made from today's visit: CMA to check on statin in 2-4 weeks.  Consider 2x per week at that time  Follow up plan: FU with me 4-6 months   Subjective: Rebecca [name] is an 89 y.o. year old female who is a primary patient of Yong Channel, Brayton Mars, MD.  The care coordination team was consulted for assistance with disease management and care coordination needs.    Engaged with patient by telephone for follow up visit.  Recent office visits:  10/13/2021 OV (Fam Med) Allwardt, Alyssa M, PA-C; no medication changes indicated.   09/30/2021 OV (PCP) Marin Olp, MD; she has not been able to tolerate the rosuvastatin 20 mg due to a weakness/jellylike sensation in her legs. Also did not tolerate atorvastatin in the past due to weakness - She would like to see how she would do on just 10 mg of rosuvastatin and she is going to try that and update me within a month if she is tolerating that-    Recent consult visits:  12/20/2021 OV ([place]) Kristeen Miss MD; no further information available.   12/14/2021 OV (Orthopedics) Persons, Bevely Palmer, [place]; We discussed using Voltaren gel which she has used in the past.    12/14/2021 OV (Oncology) Brunetta Genera, MD; no medication changes indicated.   11/26/2021 OV (Otolaryngology) Spainhour, Ann Lions, PA-C; no medication changes indicated.   11/25/2021 OV (Orthopedics) Garald Balding, MD; no medication changes indicated.   11/17/2021 OV (Orthopedics) Persons, Bevely Palmer, [place]; no medication changes  indicated.   11/12/2021 OV (Orthopedics) Persons, Bevely Palmer, [place]; no medication changes indicated.   11/04/2021 OV (Orthopedics) Garald Balding, MD; no medication changes indicated.   10/27/2021 OV (Podiatry) Wallene Huh, DPM; no medication changes indicated.   Hospital visits:  None in previous 6 months   Objective:  Lab Results  Component Value Date   CREATININE 0.96 04/05/2022   BUN 21 04/05/2022   GFR 53.34 (L) 04/05/2022   GFRNONAA >60 03/16/2022   GFRAA >60 02/23/2016   NA 131 (L) 04/05/2022   K 4.4 04/05/2022   CALCIUM 9.9 04/05/2022   CO2 24 04/05/2022   GLUCOSE 105 (H) 04/05/2022    Lab Results  Component Value Date/Time   HGBA1C 5.6 07/26/2006 08:23 AM   GFR 53.34 (L) 04/05/2022 01:59 PM   GFR 71.09 04/09/2021 11:56 AM    Last diabetic Eye exam: No results found for: "HMDIABEYEEXA"  Last diabetic Foot exam: No results found for: "HMDIABFOOTEX"   Lab Results  Component Value Date   CHOL 267 (H) 04/05/2022   HDL 47.70 04/05/2022   LDLCALC 186 (H) 04/05/2022   LDLDIRECT 89.0 05/13/2020   TRIG 165.0 (H) 04/05/2022   CHOLHDL 6 04/05/2022       Latest Ref Rng & Units 04/05/2022    1:59 PM 03/16/2022   10:18 AM 02/25/2022    5:48 PM  Hepatic Function  Total Protein 6.0 - 8.3 g/dL 5.9  6.1  6.5   Albumin 3.5 - 5.2 g/dL 4.1  3.9  4.2  AST 0 - 37 U/L '18  20  26   '$ ALT 0 - 35 U/L '12  16  18   '$ Alk Phosphatase 39 - 117 U/L 66  54  52   Total Bilirubin 0.2 - 1.2 mg/dL 0.5  0.7  0.9     Lab Results  Component Value Date/Time   TSH 0.89 04/05/2022 01:59 PM   TSH 0.766 03/26/2021 03:38 AM   TSH 0.73 03/20/2020 11:21 AM   FREET4 1.43 (H) 03/26/2021 03:38 AM   FREET4 0.92 04/28/2011 12:44 PM       Latest Ref Rng & Units 04/05/2022    1:59 PM 03/16/2022   10:18 AM 02/25/2022    5:48 PM  CBC  WBC 4.0 - 10.5 K/uL 32.3 Repeated and verified X2.  31.1  30.2   Hemoglobin 12.0 - 15.0 g/dL 12.2  12.0  12.1   Hematocrit 36.0 - 46.0 % 36.3  35.5  35.2    Platelets 150.0 - 400.0 K/uL 214.0  158  151     Lab Results  Component Value Date/Time   VD25OH 42.96 04/05/2022 01:59 PM   VD25OH 45 03/20/2020 11:21 AM   VD25OH 47.49 01/01/2016 09:55 AM   VITAMINB12 190 (L) 04/05/2022 01:59 PM   VITAMINB12 366 03/20/2020 11:21 AM    Clinical ASCVD: Yes  The ASCVD Risk score (Arnett DK, et al., 2019) failed to calculate for the following reasons:   The 2019 ASCVD risk score is only valid for ages 25 to 66        12/27/2021   10:29 AM 09/16/2021   11:39 AM 05/18/2021   10:26 AM  Depression screen PHQ 2/9  Decreased Interest 0 0 0  Down, Depressed, Hopeless 0 0 0  PHQ - 2 Score 0 0 0  Altered sleeping  0 0  Tired, decreased energy  0 0  Change in appetite  0 0  Feeling bad or failure about yourself   0 0  Trouble concentrating  0 0  Moving slowly or fidgety/restless  0 0  Suicidal thoughts  0 0  PHQ-9 Score  0 0  Difficult doing work/chores  Not difficult at all      Social History   Tobacco Use  Smoking Status Never  Smokeless Tobacco Never   BP Readings from Last 3 Encounters:  04/05/22 138/70  03/16/22 (!) 165/89  02/25/22 (!) 157/79   Pulse Readings from Last 3 Encounters:  04/05/22 (!) 54  03/16/22 (!) 57  02/25/22 83   Wt Readings from Last 3 Encounters:  04/05/22 118 lb 9.6 oz (53.8 kg)  02/25/22 124 lb (56.2 kg)  12/14/21 123 lb 1.6 oz (55.8 kg)   BMI Readings from Last 3 Encounters:  04/05/22 19.74 kg/m  02/25/22 20.63 kg/m  12/14/21 22.52 kg/m    Allergies  Allergen Reactions   Levaquin [Levofloxacin] Other (See Comments)    Very nauseous and shakey   Sulfamethoxazole-Trimethoprim Nausea Only and Other (See Comments)    Shakiness, also   Lactose Intolerance (Gi) Diarrhea and Other (See Comments)    Makes the "stomach hurt"   Tramadol Hcl Nausea Only and Palpitations   Augmentin [Amoxicillin-Pot Clavulanate] Diarrhea   Ciprofloxacin Nausea Only and Other (See Comments)    Thrush and Shakiness also    Rosuvastatin Other (See Comments)    Caused restless legs   Zosyn [Piperacillin Sod-Tazobactam So] Diarrhea and Other (See Comments)    Severe diarrhea   Adhesive [Tape] Rash   Codeine  Nausea Only   Pneumococcal Vaccine Polyvalent Rash and Other (See Comments)    Caused a RED AND RAISED RASH ON ARM    Medications Reviewed Today     Reviewed by Angela Adam, CMA (Certified Medical Assistant) on 04/05/22 at [place] List Status: <None>   Medication Order Taking? Sig Documenting Provider Last Dose Status Informant  acetaminophen (TYLENOL) 500 MG tablet [id number]  Take 500-1,000 mg by mouth every 6 (six) hours as needed for mild pain or headache. [provider]  Active Self  ALPRAZolam Duanne Moron) 0.25 MG tablet [id number]  TAKE 1 TABLET BY MOUTH AT BEDTIME AS NEEDED. DO NOT DRIVE FOR 8 HOURS AFTER TAKING AND STOP IF ANY FALLS UNTIL NEXT DOCTOR VISIT Marin Olp, MD  Active   Calcium Carb-Cholecalciferol (CALCIUM-VITAMIN D) 600-400 MG-UNIT TABS [id number]  Take 1 tablet by mouth daily. [provider]  Active Self           Med Note Duffy Bruce, Legrand Como   Fri Feb 25, 2022 10:39 PM) Patient said she also takes vitamin D-3 by itself  Cholecalciferol (VITAMIN D3) 2000 UNITS TABS [id number]  Take 1 tablet by mouth daily. [provider]  Active Self  diclofenac sodium (VOLTAREN) 1 % GEL [id number]  Apply 2 g topically 4 (four) times daily.  Patient taking differently: Apply 2 g topically 4 (four) times daily as needed (for pain).   [name], MD  Active Self  esomeprazole (NEXIUM) 20 MG capsule [id number]  Take 20 mg by mouth 2 (two) times daily before a meal. [provider]  Active Self  estradiol (ESTRACE) 0.1 MG/GM vaginal cream [id number]  Place 0.5 g vaginally See admin instructions. Place 0.5 g vaginally two to three times a week [provider]  Active Self  levothyroxine (SYNTHROID) 88 MCG tablet [id number]  Take 1 tablet (88 mcg total) by  mouth daily before breakfast. Marin Olp, MD  Active Self  loperamide (IMODIUM) 2 MG capsule [id number]  Take 1 capsule (2 mg total) by mouth 4 (four) times daily as needed for diarrhea or loose stools. Kommor, Madison, MD  Active   losartan (COZAAR) 100 MG tablet [id number]  Take 1 tablet (100 mg total) by mouth daily. Marin Olp, MD  Active Self  metoCLOPramide (REGLAN) 5 MG tablet [id number]  TAKE 1 TABLET BY MOUTH TWICE DAILY WITH MEALS  Patient taking differently: Take 5 mg by mouth 2 (two) times daily with a meal.   Burchette, Alinda Sierras, MD  Active Self  nystatin-triamcinolone (MYCOLOG II) cream [id number]  Apply 1 Application topically See admin instructions. Apply to the affected area 2 times a week as needed for irritation [provider]  Active Self    Discontinued 07/28/11 1010 (Reorder)   ondansetron (ZOFRAN-ODT) 4 MG disintegrating tablet [id number]  Take 1 tablet (4 mg total) by mouth every 8 (eight) hours as needed for nausea or vomiting. Kommor, Madison, MD  Active   rosuvastatin (CRESTOR) 20 MG tablet [id number]  TAKE 1 TABLET(20 MG) BY MOUTH DAILY  Patient not taking: Reported on 02/25/2022   Marin Olp, MD  Active Self  timolol (TIMOPTIC) 0.5 % ophthalmic solution [id number]  Place 1 drop into both eyes 2 (two) times daily. [provider]  Active Self           Med Note Maud Deed   [facility] Mar 27, 2021  7:14 PM)    vitamin B-12 (CYANOCOBALAMIN) 500 MCG tablet [id number]  Take  500 mcg by mouth every morning. [provider]  Active Self  vitamin C (ASCORBIC ACID) 500 MG tablet [id number]  Take 500 mg by mouth daily. [provider]  Active Self  VYZULTA 0.024 % SOLN [id number]  Place 1 drop into both eyes at bedtime. [provider]  Active Self            SDOH:  (Social Determinants of Health) assessments and interventions performed: No, done within the past year Financial Resource Strain: Low Risk  (12/27/2021)    Overall Financial Resource Strain (CARDIA)    Difficulty of Paying Living Expenses: Not hard at all    SDOH Interventions    Flowsheet Row Telephone from 03/01/2022 in [place] from 12/27/2021 in [place] Patient Outreach Telephone from 11/16/2021 in [place] Management from 03/06/2020 in [place] at [place] from 11/19/2019 in [place] at [place] from 05/09/2018 in [place] at [place] Interventions Intervention Not Indicated Intervention Not Indicated Intervention Not Indicated -- Intervention Not Indicated --  Housing Interventions -- Intervention Not Indicated Intervention Not Indicated -- Intervention Not Indicated --  Transportation Interventions Intervention Not Indicated Intervention Not Indicated Intervention Not Indicated Intervention Not Indicated Intervention Not Indicated --  Depression Interventions/Treatment  -- -- -- -- PHQ2-9 Score <4 Follow-up Not Indicated PHQ2-9 Score <4 Follow-up Not Indicated  Financial Strain Interventions -- Intervention Not Indicated -- Intervention Not Indicated -- --  Physical Activity Interventions -- Intervention Not Indicated -- -- Intervention Not Indicated --  Stress Interventions -- Intervention Not Indicated -- -- Intervention Not Indicated --  Social Connections Interventions -- Intervention Not Indicated -- -- Intervention Not Indicated --      SDOH Screenings   Food Insecurity: No Food Insecurity (03/01/2022)  Housing: Low Risk  (12/27/2021)  Transportation Needs: No Transportation Needs (03/01/2022)  Alcohol Screen: Low Risk  (11/19/2019)  Depression (PHQ2-9): Low Risk  (12/27/2021)  Financial Resource Strain: Low Risk  (12/27/2021)  Physical Activity:  Insufficiently Active (12/27/2021)  Social Connections: Moderately Isolated (12/27/2021)  Stress: No Stress Concern Present (12/27/2021)  Tobacco Use: Low Risk  (04/05/2022)    Medication Assistance: None required.  Patient affirms current coverage meets needs.  Medication Access: Within the past 30 days, how often has patient missed a dose of medication? 0 Is a pillbox or other method used to improve adherence? Yes  Factors that may affect medication adherence? no barriers identified Are meds synced by current pharmacy? No  Are meds delivered by current pharmacy? No  Does patient experience delays in picking up medications due to transportation concerns? No   [facility] Reviewed: Is patient disadvantaged to use [facility]?: No  Current Rx insurance plan: Medicare Name and location of Current pharmacy:  [facility] [place], [address] [place] [address] Phone: [phone] Fax: [place], [place] [address] AT [facility] [address] [place] [address] Phone: [phone] Fax: [phone]  [facility] services reviewed with patient today?: Yes  Patient requests to transfer care to [facility]?: No  Reason patient declined to change pharmacies: Loyalty to other pharmacy/Patient preference and Patient is already actively enrolled with  [facility]  Compliance/Adherence/Medication fill history: Care Gaps: None  Star-Rating Drugs: Atorvastatin '10mg'$  once weekly 90ds 04/14/22   Assessment/Plan     Hypertension (BP goal <140/90) 04/25/22 -Controlled, based on recent OV -Current treatment: Losartan 100 mg 1 tablet daily - Appropriate, Effective, Safe, Accessible -Medications previously tried: none  -Current home readings: not checking -Current dietary habits: limits salt intake -Current exercise  habits: limited -Denies hypotensive/hypertensive symptoms -Educated on BP goals and benefits of medications for prevention of heart attack, stroke and kidney damage; Importance of home blood pressure monitoring; Continue on losartan, continue current monitoring parameters.  Recent elevations due to pain in ED. No changes at this time. 100% adherence  Hyperlipidemia: (LDL goal < 70) 04/25/22 -Uncontrolled, based on last lipid screenings -Current treatment: Atorvastatin '10mg'$  once weekly - Appropriate, Query Effective -Medications previously tried: simvastatin (myalgias), Crestor -Current dietary patterns: did not discuss -Current exercise habits: limited -Educated on Cholesterol goals;  Benefits of statin for ASCVD risk reduction; Importance of limiting foods high in cholesterol; Exercise goal of 150 minutes per week; -Counseled on diet and exercise extensively -Currently she is tolerating the once weekly dose of statin.  I am unsure if she will get to goal on ject once weekly.  She does state she would be willing to increase to twice weekly if she continues to tolerate.  Has FU visit with PCP in July.  I will plan to have CMA call to check on tolerance in about 2 weeks and we can discuss increased frequency at that time. No changes  Anxiety (Goal: minimize symptoms) -Controlled -Current treatment: Alprazolam 0.25 mg 1 tablet at bedtime as needed - Appropriate, Effective, Safe, Accessible -Medications previously tried/failed: none -GAD7: n/a -Educated on Benefits of medication for symptom control -Recommended to continue current medication Counseled on continued use of alprazolam sparingly due to long term risks  Osteopenia (Goal prevent fractures) -Controlled -Last DEXA Scan: not able to access records from OBGYN   T-Score femoral neck: n/a  T-Score total hip: n/a  T-Score lumbar spine: n/a  T-Score forearm radius: n/a  10-year probability of major osteoporotic fracture:  n/a  10-year probability of hip fracture: n/a -Patient is not a candidate for pharmacologic treatment -Current treatment  Vitamin D 2000 units 1 tablet daily - Appropriate, Effective, Safe, Accessible Calcium carbonate-vit D 600-200 mg-unit 1 tablet 2 times daily - Appropriate, Effective, Safe, Accessible -Medications previously tried: Reclast (finished 5 years of treatment) -Recommend [phone] units of vitamin D daily. Recommend 1200 mg of calcium daily from dietary and supplemental sources. Recommend weight-bearing and muscle strengthening exercises for building and maintaining bone density. -Counseled on diet and exercise extensively  Neuropathy (Goal: minimize symptoms) -Controlled -Current treatment  No medications -Medications previously tried: gabapentin -Recommended to continue current medication  GERD (Goal: minimize symptoms) -Controlled -Current treatment  Esomeprazole 20 mg 1 capsule twice daily - Appropriate, Effective, Safe, Accessible Metoclopramide 5 mg 1 tablet daily (written as twice daily) - Appropriate, Effective, Safe, Accessible -Medications previously tried: none  -Counseled on diet and exercise extensively Recommended to continue current medication  Eye medications (Goal: minimize symptoms) -Controlled -Current treatment  Latanoprost 0.005% ophthalmic solution place 1 drop in both eyes at bedtime - Appropriate, Effective, Safe, Accessible Timolol 0.5% ophthalmic solution place 1 drop in both eyes twice daily Appropriate, Effective, Safe, Accessible -Medications previously tried: Vyzulta (cost) -Recommended to continue current medication   Health Maintenance -Vaccine gaps: tetanus (too expensive), Prevnar -Current therapy:  Vitamin B 12 500 mcg 1 tablets daily  Diclofenac gel 1% as needed Vitamin C 500 mg 1 tablet daily -Educated on Cost vs benefit of each product must be carefully weighed by individual consumer -Patient is satisfied with current  therapy and denies issues -Recommended to continue current medication  Patient Goals/Self-Care Activities Patient will:  - take medications as prescribed check blood pressure weekly, document, and provide at future appointments target a minimum of 150 minutes of moderate intensity exercise weekly  Follow Up Plan: The patient has been provided with contact information for the care management team and has been advised to call with any health related questions or concerns.         Beverly Milch, PharmD Clinical Pharmacist  [facility] [phone]

## 2022-04-27 ENCOUNTER — Encounter (HOSPITAL_COMMUNITY): Payer: Medicare Other

## 2022-04-28 ENCOUNTER — Ambulatory Visit (HOSPITAL_COMMUNITY)
Admission: RE | Admit: 2022-04-28 | Discharge: 2022-04-28 | Disposition: A | Discharge status: Home / Self Care | Payer: Medicare Other | Source: Ambulatory Visit | Attending: Internal Medicine | Admitting: Internal Medicine

## 2022-04-28 DIAGNOSIS — I779 Disorder of arteries and arterioles, unspecified: Secondary | ICD-10-CM | POA: Diagnosis not present

## 2022-04-28 DIAGNOSIS — I6523 Occlusion and stenosis of bilateral carotid arteries: Secondary | ICD-10-CM | POA: Diagnosis not present

## 2022-04-29 ENCOUNTER — Other Ambulatory Visit: Payer: Self-pay | Admitting: Family Medicine

## 2022-05-20 DIAGNOSIS — Z1231 Encounter for screening mammogram for malignant neoplasm of breast: Secondary | ICD-10-CM | POA: Diagnosis not present

## 2022-05-20 DIAGNOSIS — N816 Rectocele: Secondary | ICD-10-CM | POA: Diagnosis not present

## 2022-05-20 DIAGNOSIS — Z4689 Encounter for fitting and adjustment of other specified devices: Secondary | ICD-10-CM | POA: Diagnosis not present

## 2022-05-20 DIAGNOSIS — N8110 Cystocele, unspecified: Secondary | ICD-10-CM | POA: Diagnosis not present

## 2022-05-31 ENCOUNTER — Other Ambulatory Visit: Payer: Self-pay | Admitting: Family Medicine

## 2022-06-03 ENCOUNTER — Telehealth: Payer: Self-pay | Admitting: Pharmacist

## 2022-06-03 NOTE — Progress Notes (Signed)
Care Management & Coordination Services Pharmacy Team  Reason for Encounter: General adherence update   Contacted patient for general health update and medication adherence call.  Spoke with patient on 06/03/2022    What concerns do you have about your medications? None  The patient denies side effects with their medications.   How often do you forget or accidentally miss a dose? Never  Do you use a pillbox? No  Are you having any problems getting your medications from your pharmacy? No  Has the cost of your medications been a concern? No If yes, what medication and is patient assistance available or has it been applied for?  Since last visit with PharmD, no interventions have been made.   The patient has not had an ED visit since last contact.   The patient denies problems with their health.   Patient denies concerns or questions for [name], PharmD at this time.   Counseled patient on: Access to carecoordination team for any cost, medication or pharmacy concerns.  Patient states she has been doing well taking her statin medication once a week. She is willing to go up to twice weekly.  Chart Updates:  Recent office visits:  None since last PharmD visit  Recent consult visits:  None since last PharmD visit  Hospital visits:  None since last PharmD visit  Medications: Outpatient Encounter Medications as of 06/03/2022  Medication Sig Note   acetaminophen (TYLENOL) 500 MG tablet Take 500-1,000 mg by mouth every 6 (six) hours as needed for mild pain or headache.    ALPRAZolam (XANAX) 0.25 MG tablet TAKE 1 TABLET BY MOUTH AT BEDTIME AS NEEDED. DO NOT DRIVE FOR 8 HOURS AFTER TAKING AND STOP IF ANY FALLS UNTIL NEXT DOCTOR VISIT    atorvastatin (LIPITOR) 10 MG tablet Take 1 tablet (10 mg total) by mouth once a week.    Calcium Carb-Cholecalciferol (CALCIUM-VITAMIN D) 600-400 MG-UNIT TABS Take 1 tablet by mouth daily. 02/25/2022: Patient said she also takes vitamin D-3 by  itself   Cholecalciferol (VITAMIN D3) 2000 UNITS TABS Take 1 tablet by mouth daily.    diclofenac sodium (VOLTAREN) 1 % GEL Apply 2 g topically 4 (four) times daily. (Patient taking differently: Apply 2 g topically 4 (four) times daily as needed (for pain).)    doxycycline (VIBRA-TABS) 100 MG tablet Take 100 mg by mouth 2 (two) times daily.    esomeprazole (NEXIUM) 20 MG capsule Take 20 mg by mouth 2 (two) times daily before a meal.    estradiol (ESTRACE) 0.1 MG/GM vaginal cream Place 0.5 g vaginally See admin instructions. Place 0.5 g vaginally two to three times a week    levothyroxine (SYNTHROID) 88 MCG tablet Take 1 tablet (88 mcg total) by mouth daily before breakfast.    loperamide (IMODIUM) 2 MG capsule Take 1 capsule (2 mg total) by mouth 4 (four) times daily as needed for diarrhea or loose stools.    losartan (COZAAR) 100 MG tablet Take 1 tablet (100 mg total) by mouth daily.    metoCLOPramide (REGLAN) 5 MG tablet TAKE 1 TABLET BY MOUTH TWICE DAILY WITH MEALS (Patient taking differently: Take 5 mg by mouth 2 (two) times daily with a meal.)    nitrofurantoin, macrocrystal-monohydrate, (MACROBID) 100 MG capsule Take 100 mg by mouth every 12 (twelve) hours.    nystatin-triamcinolone (MYCOLOG II) cream Apply 1 Application topically See admin instructions. Apply to the affected area 2 times a week as needed for irritation    ondansetron (ZOFRAN-ODT)  4 MG disintegrating tablet Take 1 tablet (4 mg total) by mouth every 8 (eight) hours as needed for nausea or vomiting.    timolol (TIMOPTIC) 0.5 % ophthalmic solution Place 1 drop into both eyes 2 (two) times daily.    vitamin B-12 (CYANOCOBALAMIN) 500 MCG tablet Take 500 mcg by mouth every morning.    vitamin C (ASCORBIC ACID) 500 MG tablet Take 500 mg by mouth daily.    VYZULTA 0.024 % SOLN Place 1 drop into both eyes at bedtime.    [DISCONTINUED] olmesartan (BENICAR) 20 MG tablet Take 20 mg by mouth daily.      No facility-administered encounter  medications on file as of 06/03/2022.    Recent vitals BP Readings from Last 3 Encounters:  04/05/22 138/70  03/16/22 (!) 165/89  02/25/22 (!) 157/79   Pulse Readings from Last 3 Encounters:  04/05/22 (!) 54  03/16/22 (!) 57  02/25/22 83   Wt Readings from Last 3 Encounters:  04/05/22 118 lb 9.6 oz (53.8 kg)  02/25/22 124 lb (56.2 kg)  12/14/21 123 lb 1.6 oz (55.8 kg)   BMI Readings from Last 3 Encounters:  04/05/22 19.74 kg/m  02/25/22 20.63 kg/m  12/14/21 22.52 kg/m    Recent lab results    Component Value Date/Time   NA 131 (L) 04/05/2022 1359   K 4.4 04/05/2022 1359   CL 96 04/05/2022 1359   CO2 24 04/05/2022 1359   GLUCOSE 105 (H) 04/05/2022 1359   BUN 21 04/05/2022 1359   CREATININE 0.96 04/05/2022 1359   CREATININE 1.12 (H) 12/14/2021 1233   CREATININE 0.90 (H) 03/20/2020 1121   CALCIUM 9.9 04/05/2022 1359    Lab Results  Component Value Date   CREATININE 0.96 04/05/2022   GFR 53.34 (L) 04/05/2022   GFRNONAA >60 03/16/2022   GFRAA >60 02/23/2016   Lab Results  Component Value Date/Time   HGBA1C 5.6 07/26/2006 08:23 AM    Lab Results  Component Value Date   CHOL 267 (H) 04/05/2022   HDL 47.70 04/05/2022   LDLCALC 186 (H) 04/05/2022   LDLDIRECT 89.0 05/13/2020   TRIG 165.0 (H) 04/05/2022   CHOLHDL 6 04/05/2022    Care Gaps: Annual wellness visit in last year? Yes   Star Rating Drugs:  Atorvastatin 10 mg last filled 04/14/2022 90 DS Losartan 100 mg last filled 02/04/2022 90 DS   Future Appointments  Date Time Provider [place]  06/14/2022 12:30 PM [facility] None  06/14/2022  1:00 PM Brunetta Genera, MD [facility] None  08/26/2022  2:00 PM Edythe Clarity, [place] None  10/04/2022  9:20 AM Marin Olp, MD [facility]  01/05/2023 11:00 AM [facility] HEALTH COACH [facility]   April D Calhoun, [place] Pharmacist Assistant [phone]

## 2022-06-13 ENCOUNTER — Other Ambulatory Visit: Payer: Self-pay

## 2022-06-13 DIAGNOSIS — C9110 Chronic lymphocytic leukemia of B-cell type not having achieved remission: Secondary | ICD-10-CM

## 2022-06-14 ENCOUNTER — Inpatient Hospital Stay: Payer: Medicare Other

## 2022-06-14 ENCOUNTER — Inpatient Hospital Stay: Payer: Medicare Other | Attending: Hematology | Admitting: Hematology

## 2022-06-14 VITALS — BP 158/68 | HR 52 | Temp 97.5°F | Resp 18 | Wt 118.6 lb

## 2022-06-14 DIAGNOSIS — C9110 Chronic lymphocytic leukemia of B-cell type not having achieved remission: Secondary | ICD-10-CM | POA: Diagnosis not present

## 2022-06-14 LAB — CMP (CANCER CENTER ONLY)
ALT: 10 U/L (ref 0–44)
AST: 18 U/L (ref 15–41)
Albumin: 4.2 g/dL (ref 3.5–5.0)
Alkaline Phosphatase: 74 U/L (ref 38–126)
Anion gap: 8 (ref 5–15)
BUN: 11 mg/dL (ref 8–23)
CO2: 25 mmol/L (ref 22–32)
Calcium: 10 mg/dL (ref 8.9–10.3)
Chloride: 97 mmol/L — ABNORMAL LOW (ref 98–111)
Creatinine: 0.83 mg/dL (ref 0.44–1.00)
GFR, Estimated: >60 mL/min (ref >60)
Glucose, Bld: 102 mg/dL — ABNORMAL HIGH (ref 70–99)
Potassium: 4.3 mmol/L (ref 3.5–5.1)
Sodium: 130 mmol/L — ABNORMAL LOW (ref 135–145)
Total Bilirubin: 0.5 mg/dL (ref 0.3–1.2)
Total Protein: 6.5 g/dL (ref 6.5–8.1)

## 2022-06-14 LAB — CBC WITH DIFFERENTIAL (CANCER CENTER ONLY)
Abs Immature Granulocytes: 0.05 10*3/uL (ref 0.00–0.07)
Basophils Absolute: 0.1 10*3/uL (ref 0.0–0.1)
Basophils Relative: 0 %
Eosinophils Absolute: 0.2 10*3/uL (ref 0.0–0.5)
Eosinophils Relative: 1 %
HCT: 36.6 % (ref 36.0–46.0)
Hemoglobin: 12.7 g/dL (ref 12.0–15.0)
Immature Granulocytes: 0 %
Lymphocytes Relative: 68 %
Lymphs Abs: 16.4 10*3/uL — ABNORMAL HIGH (ref 0.7–4.0)
MCH: 34.9 pg — ABNORMAL HIGH (ref 26.0–34.0)
MCHC: 34.7 g/dL (ref 30.0–36.0)
MCV: 100.5 fL — ABNORMAL HIGH (ref 80.0–100.0)
Monocytes Absolute: 3.7 10*3/uL — ABNORMAL HIGH (ref 0.1–1.0)
Monocytes Relative: 15 %
Neutro Abs: 3.8 10*3/uL (ref 1.7–7.7)
Neutrophils Relative %: 16 %
Platelet Count: 190 10*3/uL (ref 150–400)
RBC: 3.64 MIL/uL — ABNORMAL LOW (ref 3.87–5.11)
RDW: 12.2 % (ref 11.5–15.5)
Smear Review: NORMAL
WBC Count: 24.2 10*3/uL — ABNORMAL HIGH (ref 4.0–10.5)
nRBC: 0 % (ref 0.0–0.2)

## 2022-06-14 LAB — LACTATE DEHYDROGENASE: LDH: 129 U/L (ref 98–192)

## 2022-06-14 NOTE — Progress Notes (Signed)
Rebecca [name]   HEMATOLOGY/ONCOLOGY CLINIC NOTE  Date of Service: 06/14/2022  Patient Care Team: Marin Olp, MD as PCP - General (Family Medicine) Ladene Artist, MD as Consulting Physician (Gastroenterology) Aloha Gell, MD as Consulting Physician (Obstetrics and Gynecology) Luberta Mutter, MD as Consulting Physician (Ophthalmology) [name], [facility] (Inactive) as Pharmacist (Pharmacist)  CHIEF COMPLAINTS/PURPOSE OF CONSULTATION:  Follow-up for continued evaluation and management of CLL  HISTORY OF PRESENTING ILLNESS:  Rebecca [name] is a wonderful 89 y.o. female who has been referred to [place] by Dr Leighton Ruff MD for evaluation and management of lymphocytosis.  Patient has a history of hypertension, GERD, hypothyroidism was noted to have biliary colic in November [id number] and referred for surgical evaluation due to persistent and worsening symptoms.  In the emergency room the patient was noted to have elevated LFTs and significant leukocytosis of 27.4k.  CT scan showed cholelithiasis, ultrasound abdomen showed cholelithiasis without acute cholecystitis.  MRCP showed distended CBD with choledocholithiasis.  Patient subsequently had ERCP on 12/24 with biliary sphincterotomy and subsequently had her laparoscopic cholecystectomy on 03/28/2021.  Surgical pathology from her cholecystectomy showed chronic cholecystitis without cholelithiasis. 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 Labs ordered 04/09/2021 showed persistent leukocytosis of 19.2k with lymphocyte count of 15.2k hemoglobin of 11.1 and platelets of 219k.  Patient was referred to [place] for further evaluation of CD5 positive lymphoproliferative disorder.  Patient notes no fevers no chills no night sweats. No new lumps or bumps. CT abdomen 04/15/2020 showed enlarged abdominal pelvic lymph nodes.  INTERVAL HISTORY  Rebecca [name] is a 89 y.o. female is here for continued  evaluation and management of her chronic lymphocytic leukemia.   Patient was last seen by me on 12/14/2021 and she was doing well overall.   Patient is accompanied by her daughter during this visit. She reports she has been doing overall without any medical concerns since our last visit. She notes that she still has skin rash/lesion near her nose, which has not changed in the last 6 months. She is following up with her Dermatologist and they are observing it.   She notes that she is having her wisdom teeth removed tomorrow 06/15/2022.   She denies fever, chills, night sweats, infection issues, new lumps/bumps, shortness of breath, abdominal pain, chest pain, back pain, or leg swelling.  MEDICAL HISTORY:  Past Medical History:  Diagnosis Date   Anemia    Anxiety    Bifascicular block    Diverticulitis    Diverticulosis    Gastroparesis    GERD (gastroesophageal reflux disease)    Glaucoma    Hemorrhoids    Hyperlipidemia    Hypertension    Hypertension    IBS (irritable bowel syndrome)    Rectal ulcer    SCOLIOSIS, LUMBAR SPINE 03/09/2010   Qualifier: Diagnosis of  By: Arnoldo Morale MD, John E    Segmental colitis [facility])    Tubular adenoma of colon 05/2009    SURGICAL HISTORY: Past Surgical History:  Procedure Laterality Date   APPENDECTOMY     ARCUATE KERATECTOMY     BILIARY DILATION  03/27/2021   Procedure: BILIARY DILATION;  Surgeon: [name], MD;  Location: [facility];  Service: Gastroenterology;;   CATARACT EXTRACTION, BILATERAL  2015   CHOLECYSTECTOMY N/A 03/28/2021   Procedure: LAPAROSCOPIC CHOLECYSTECTOMY;  Surgeon: Leighton Ruff, MD;  Location: [facility];  Service: General;  Laterality: N/A;   double pallital tori bone removed  ERCP N/A 03/27/2021   Procedure: ENDOSCOPIC RETROGRADE CHOLANGIOPANCREATOGRAPHY (ERCP);  Surgeon: [name], MD;  Location: [facility];  Service: Gastroenterology;  Laterality: N/A;   HERNIA REPAIR     umbilical   KNEE  SURGERY Bilateral    morton's neuroma on rt foot and platar facial release     MOUTH SURGERY     "bone shaved off roof of mouth"   REMOVAL OF STONES  03/27/2021   Procedure: REMOVAL OF STONES;  Surgeon: [name], MD;  Location: [facility];  Service: Gastroenterology;;   SALIVARY GLAND SURGERY     removal   SPHINCTEROTOMY  03/27/2021   Procedure: SPHINCTEROTOMY;  Surgeon: [name], MD;  Location: [facility];  Service: Gastroenterology;;   TONSILLECTOMY      SOCIAL HISTORY: Social History   Socioeconomic History   Marital status: Widowed    Spouse name: Not on file   Number of children: 3   Years of education: Not on file   Highest education level: Not on file  Occupational History   Occupation: Glass blower/designer    Comment: retired  Tobacco Use   Smoking status: Never   Smokeless tobacco: Never  Vaping Use   Vaping Use: Never used  Substance and Sexual Activity   Alcohol use: Yes    Alcohol/week: 0.0 standard drinks of alcohol    Comment: rare   Drug use: No   Sexual activity: Not Currently  Other Topics Concern   Not on file  Social History Narrative   Widowed. 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      Hobbies: Enjoys reading, especially mysteries, Walks for exercise   Attends church                  Social Determinants of Health   Financial Resource Strain: [place]  (12/27/2021)   Overall Financial Resource Strain (CARDIA)    Difficulty of Paying Living Expenses: Not hard at all  Food Insecurity: No Food Insecurity (03/01/2022)   Hunger Vital Sign    Worried About Running Out of Food in the Last Year: Never true    [place] in the Last Year: Never true  Transportation Needs: No Transportation Needs (03/01/2022)   PRAPARE - Hydrologist (Medical): No    Lack of Transportation  (Non-Medical): No  Physical Activity: Insufficiently Active (12/27/2021)   Exercise Vital Sign    Days of Exercise per Week: 7 days    Minutes of Exercise per Session: 20 min  Stress: No Stress Concern Present (12/27/2021)   [place]    Feeling of Stress : Not at all  Social Connections: Moderately Isolated (12/27/2021)   Social Connection and Isolation Panel [NHANES]    Frequency of Communication with Friends and Family: More than three times a week    Frequency of Social Gatherings with Friends and Family: More than three times a week    Attends Religious Services: More than 4 times per year    Active Member of [employer] or Organizations: No    Attends Archivist Meetings: Never    Marital Status: Widowed  Intimate Partner Violence: Not At Risk (12/27/2021)   Humiliation, Afraid, Rape, and Kick questionnaire    Fear of Current or Ex-Partner: No    Emotionally Abused: No  Physically Abused: No    Sexually Abused: No    FAMILY HISTORY: Family History  Problem Relation Age of Onset   Diabetes Mother    Heart disease Mother    Hyperlipidemia Mother    Hypertension Mother    Anuerysm Father        abdominal- heavy smoker   Heart disease Father    Hypertension Father    Colon polyps Sister    Lung cancer Sister        smoker   Colon polyps Brother    Diabetes Brother    Hyperlipidemia Brother    Hypertension Brother    Heart disease Brother    Colon cancer Paternal Grandmother        with possible stomach cancer   Migraines Daughter    Kidney Stones Daughter    Colon polyps Daughter    Breast cancer Daughter    Cancer Daughter        salivary   Colon cancer Maternal Aunt        Rectal cancer   Crohn's disease Neg Hx    Pancreatic cancer Neg Hx     ALLERGIES:  is allergic to levaquin [levofloxacin], sulfamethoxazole-trimethoprim, lactose intolerance (gi), tramadol hcl, augmentin  [amoxicillin-pot clavulanate], ciprofloxacin, rosuvastatin, zosyn [piperacillin sod-tazobactam so], adhesive [tape], codeine, and pneumococcal vaccine polyvalent.  MEDICATIONS:  Current Outpatient Medications  Medication Sig Dispense Refill   acetaminophen (TYLENOL) 500 MG tablet Take 500-1,000 mg by mouth every 6 (six) hours as needed for mild pain or headache.     ALPRAZolam (XANAX) 0.25 MG tablet TAKE 1 TABLET BY MOUTH AT BEDTIME AS NEEDED. DO NOT DRIVE FOR 8 HOURS AFTER TAKING AND STOP IF ANY FALLS UNTIL NEXT DOCTOR VISIT 20 tablet 0   atorvastatin (LIPITOR) 10 MG tablet Take 1 tablet (10 mg total) by mouth once a week. 13 tablet 3   Calcium Carb-Cholecalciferol (CALCIUM-VITAMIN D) 600-400 MG-UNIT TABS Take 1 tablet by mouth daily.     Cholecalciferol (VITAMIN D3) 2000 UNITS TABS Take 1 tablet by mouth daily.     diclofenac sodium (VOLTAREN) 1 % GEL Apply 2 g topically 4 (four) times daily. (Patient taking differently: Apply 2 g topically 4 (four) times daily as needed (for pain).) 1 Tube 1   doxycycline (VIBRA-TABS) 100 MG tablet Take 100 mg by mouth 2 (two) times daily.     esomeprazole (NEXIUM) 20 MG capsule Take 20 mg by mouth 2 (two) times daily before a meal.     estradiol (ESTRACE) 0.1 MG/GM vaginal cream Place 0.5 g vaginally See admin instructions. Place 0.5 g vaginally two to three times a week     levothyroxine (SYNTHROID) 88 MCG tablet Take 1 tablet (88 mcg total) by mouth daily before breakfast. 90 tablet 3   loperamide (IMODIUM) 2 MG capsule Take 1 capsule (2 mg total) by mouth 4 (four) times daily as needed for diarrhea or loose stools. 12 capsule 0   losartan (COZAAR) 100 MG tablet Take 1 tablet (100 mg total) by mouth daily. 90 tablet 3   metoCLOPramide (REGLAN) 5 MG tablet TAKE 1 TABLET BY MOUTH TWICE DAILY WITH MEALS (Patient taking differently: Take 5 mg by mouth 2 (two) times daily with a meal.) 60 tablet 3   nitrofurantoin, macrocrystal-monohydrate, (MACROBID) 100 MG  capsule Take 100 mg by mouth every 12 (twelve) hours.     nystatin-triamcinolone (MYCOLOG II) cream Apply 1 Application topically See admin instructions. Apply to the affected area 2 times a week as  needed for irritation     ondansetron (ZOFRAN-ODT) 4 MG disintegrating tablet Take 1 tablet (4 mg total) by mouth every 8 (eight) hours as needed for nausea or vomiting. 20 tablet 0   timolol (TIMOPTIC) 0.5 % ophthalmic solution Place 1 drop into both eyes 2 (two) times daily.     vitamin B-12 (CYANOCOBALAMIN) 500 MCG tablet Take 500 mcg by mouth every morning.     vitamin C (ASCORBIC ACID) 500 MG tablet Take 500 mg by mouth daily.     VYZULTA 0.024 % SOLN Place 1 drop into both eyes at bedtime.     No current facility-administered medications for this visit.    REVIEW OF SYSTEMS:   10 Point review of Systems was done is negative except as noted above.  PHYSICAL EXAMINATION:  .BP (!) 158/68   Pulse (!) 52   Temp (!) 97.5 F (36.4 C)   Resp 18   Wt 118 lb 9.6 oz (53.8 kg)   SpO2 98%   BMI 19.74 kg/m  NAD GENERAL:alert, in no acute distress and comfortable SKIN: no acute rashes, no significant lesions EYES: conjunctiva are pink and non-injected, sclera anicteric NECK: supple, no JVD LYMPH:  no palpable lymphadenopathy in the cervical, axillary or inguinal regions LUNGS: clear to auscultation b/l with normal respiratory effort HEART: regular rate & rhythm ABDOMEN:  normoactive bowel sounds , non tender, not distended. Extremity: no pedal edema PSYCH: alert & oriented x 3 with fluent speech NEURO: no focal motor/sensory deficits  LABORATORY DATA:  I have reviewed the data as listed     Latest Ref Rng & Units 06/14/2022   12:40 PM 04/05/2022    1:59 PM 03/16/2022   10:18 AM  CBC  WBC 4.0 - 10.5 K/uL 24.2  32.3 Repeated and verified X2.  31.1   Hemoglobin 12.0 - 15.0 g/dL 12.7  12.2  12.0   Hematocrit 36.0 - 46.0 % 36.6  36.3  35.5   Platelets 150 - 400 K/uL 190  214.0  158        Latest Ref Rng & Units 06/14/2022   12:40 PM 04/05/2022    1:59 PM 03/16/2022   10:18 AM  CMP  Glucose 70 - 99 mg/dL 102  105  116   BUN 8 - 23 mg/dL 11  21  13    Creatinine 0.44 - 1.00 mg/dL 0.83  0.96  0.81   Sodium 135 - 145 mmol/L 130  131  130   Potassium 3.5 - 5.1 mmol/L 4.3  4.4  4.4   Chloride 98 - 111 mmol/L 97  96  101   CO2 22 - 32 mmol/L 25  24  22    Calcium 8.9 - 10.3 mg/dL 10.0  9.9  9.8   Total Protein 6.5 - 8.1 g/dL 6.5  5.9  6.1   Total Bilirubin 0.3 - 1.2 mg/dL 0.5  0.5  0.7   Alkaline Phos 38 - 126 U/L 74  66  54   AST 15 - 41 U/L 18  18  20    ALT 0 - 44 U/L 10  12  16     Component     Latest Ref Rng & Units 05/05/2021  Hep B Core Total Ab     NON REACTIVE NON REACTIVE  Hepatitis B Surface Ag     NON REACTIVE NON REACTIVE  HCV Ab     NON REACTIVE NON REACTIVE  LDH     98 - 192 U/L 123  Surgical Pathology  CASE: [id number]  PATIENT: Celesta Hewson  Flow Pathology Report      Clinical history: CLL    DIAGNOSIS:   -  Monoclonal B-cell population with co-expression of CD5 comprises 80%  of all lymphocytes  -  See comment   COMMENT:   The phenotypic features are consistent with involvement by non-Hodgkin  B-cell lymphoma. Given the presence of CD5 expression, the differential  diagnosis includes chronic lymphocytic leukemia/small lymphocytic  lymphoma and mantle cell lymphoma.   PATHOLOGY SURGICAL PATHOLOGY   THIS IS AN ADDENDUM REPORT   CASE: [id number]  PATIENT: Doree Barthel  Surgical Pathology Report  Addendum   Reason for Addendum #1:  Immunohistochemistry results   Clinical History: Acute Cholecystitis (jmc)      FINAL MICROSCOPIC DIAGNOSIS:   A. GALLBLADDER, CHOLECYSTECTOMY:  - Chronic cholecystitis, without cholelithiasis.  - Atypical CD5+ B cell infiltrate, most suggestive of involvement by  CLL/SLL.   COMMENT:   A. Given the presence of marked lymphocytic inflammation,  immunohistochemical studies were  performed. Studies show a significant  predominance of CD20+ B cells, with aberrant co-expression of CD5 and  CD23. There are relatively fewer background CD3+ T cells. The features  are most suggestive of involvement by CLL/SLL. An immunohistochemical  study for Cyclin-D1 is pending, and will be reported as an addendum.  Recommend correlation with peripheral blood findings and evaluation for  other potential sites of adenopathy.     RADIOGRAPHIC STUDIES: I have personally reviewed the radiological images as listed and agreed with the findings in the report. No results found.  ASSESSMENT & PLAN:   89 year old female with recent laparoscopic cholecystectomy noted to have  1) recently diagnosed CLL/SLL with 13 q. and 11 q. Deletions. 2) CD5 positive lymphoid infiltrate in gallbladder pathology suggestive of involvement by CLL/SLL -CT abdomen was done recently on 03/25/2021-did not show significant lymphadenopathy or hepatosplenomegaly.  Noted to have some lung nodules in the lower lobes.  3) .  Basal cell carcinoma on the right side of the nose Seeing Dr. Wilhemina Bonito at [facility] dermatology for management  -Continue active surveillance for recurrent skin cancers since the CLL especially with 13 q. deletion would be a risk factor.  PLAN: -discussed lab results from today, 06/14/2022, with the patient and her daughter. CBC shows elevated WBC at 24.2, but stable overall. Normal hgb and platelets  CMP is stable. -No clinical or lab results suggestive of significant progression of chronic lymphocytic leukemia. -No treatment needed at this time for CLL. -recommended influenza vaccine, COVID-19 Booster, RSV vaccine, and staying up to speed with other age appropriate vaccines.   -continue f/u with PCP for mx of other chronic medical comorbidities.  FOLLOW-UP: RTC with Dr Irene Limbo with labs in 6 months  The total time spent in the appointment was 21 minutes* .  All of the patient's questions  were answered with apparent satisfaction. The patient knows to call the clinic with any problems, questions or concerns.   Sullivan Lone MD MS AAHIVMS [facility] Hematology/Oncology Physician [facility]  .*Total Encounter Time as defined by the Centers for Medicare and Medicaid Services includes, in addition to the face-to-face time of a patient visit (documented in the note above) non-face-to-face time: obtaining and reviewing outside history, ordering and reviewing medications, tests or procedures, care coordination (communications with other health care professionals or caregivers) and documentation in the medical record.   Zettie Cooley, am acting as a Education administrator for Sullivan Lone, MD.  .  I have reviewed the above documentation for accuracy and completeness, and I agree with the above. Brunetta Genera MD

## 2022-06-15 ENCOUNTER — Other Ambulatory Visit: Payer: Self-pay | Admitting: Family Medicine

## 2022-06-15 MED ORDER — ATORVASTATIN CALCIUM 10 MG PO TABS: 10.0000 mg | ORAL_TABLET | ORAL | 3 refills | Status: DC

## 2022-06-21 ENCOUNTER — Telehealth: Payer: Self-pay | Admitting: Hematology

## 2022-06-21 NOTE — Telephone Encounter (Signed)
Called patient per voicemail received to reschedule f/u appointments. Patient rescheduled and notified.

## 2022-06-29 DIAGNOSIS — H401113 Primary open-angle glaucoma, right eye, severe stage: Secondary | ICD-10-CM | POA: Diagnosis not present

## 2022-06-29 DIAGNOSIS — H401122 Primary open-angle glaucoma, left eye, moderate stage: Secondary | ICD-10-CM | POA: Diagnosis not present

## 2022-06-30 ENCOUNTER — Other Ambulatory Visit: Payer: Self-pay | Admitting: Family Medicine

## 2022-06-30 NOTE — Telephone Encounter (Signed)
Last refill: 05/31/22 #20, 0 Last OV: 04/05/22 dx. HTN, anxiety

## 2022-07-04 NOTE — Telephone Encounter (Signed)
Team please reach out to her-frequency of taking this medication has substantially increased-she is high fall risk and I would really like to minimize this-I would suggest we have an appointment to discuss if she is willing

## 2022-07-05 NOTE — Telephone Encounter (Signed)
I refilled this-please let her know we were mainly wanted to make sure she was okay-sometimes increased use can be a sign of significant worsening of anxiety and/or depression and in cases like that I like to see patients back to see if there is something else we can do to help-I hope things will settle down for her and we can start to space the medicine back out

## 2022-07-05 NOTE — Telephone Encounter (Signed)
Team please reach out to her-frequency of taking this medication has substantially increased-she is high fall risk and I would really like to minimize this-I would suggest we have an appointment to discuss if she is willing

## 2022-07-05 NOTE — Telephone Encounter (Signed)
Pt returned call and states you are correct about the increase however, she never had any problems getting this filled by her other doctor and does not understand why this phone call with Dr. Ronney Lion questions was given to her. Pt states she spaces out when she takes her medicine and is not taking this daily. Pt states she is not abusing this medication, she only takes 1/2 tab sparingly however, she has had a rough few months and this simply helps calm her nerves and she has had to had a wisdom tooth pulled on top of everything else.

## 2022-07-07 ENCOUNTER — Ambulatory Visit (INDEPENDENT_AMBULATORY_CARE_PROVIDER_SITE_OTHER): Payer: Medicare Other | Admitting: Physician Assistant

## 2022-07-07 ENCOUNTER — Encounter: Payer: Self-pay | Admitting: Physician Assistant

## 2022-07-07 DIAGNOSIS — M25561 Pain in right knee: Secondary | ICD-10-CM | POA: Diagnosis not present

## 2022-07-07 DIAGNOSIS — M170 Bilateral primary osteoarthritis of knee: Secondary | ICD-10-CM | POA: Diagnosis not present

## 2022-07-07 DIAGNOSIS — M25562 Pain in left knee: Secondary | ICD-10-CM

## 2022-07-07 MED ORDER — BUPIVACAINE HCL 0.25 % IJ SOLN
2.0000 mL | INTRAMUSCULAR | Status: AC | PRN
  Administered 2022-07-07: 2 mL via INTRA_ARTICULAR

## 2022-07-07 MED ORDER — LIDOCAINE HCL 1 % IJ SOLN
2.0000 mL | INTRAMUSCULAR | Status: AC | PRN
  Administered 2022-07-07: 2 mL

## 2022-07-07 MED ORDER — METHYLPREDNISOLONE ACETATE 40 MG/ML IJ SUSP
80.0000 mg | INTRAMUSCULAR | Status: AC | PRN
  Administered 2022-07-07: 80 mg via INTRA_ARTICULAR

## 2022-07-07 NOTE — Progress Notes (Signed)
Office Visit Note   Patient: Rebecca [name]           Date of Birth: [birth year]           MRN: [id number] Visit Date: 07/07/2022              Requested by: Marin Olp, MD [place],  [place] [address] PCP: Marin Olp, MD  Chief Complaint  Patient presents with  . Right Knee - Pain      HPI: Ms. Prusha is a pleasant 89 year old patient of Dr. Rudene Anda.  She has a history of bilateral osteoarthritis of her knees.  She has done well with both steroid and viscosupplementation.  She said she was at a funeral recently and had to sit for a long time and has had some pain in her right knee.  Requesting an injection today  Assessment & Plan: Visit Diagnoses:  1. Bilateral primary osteoarthritis of knee     Plan: Will go forward with a right knee steroid injection.  It has been 6 months since she had her previous viscosupplementation.  She is thinking about having this again.  Will contact us if she wishes to go forward  Follow-Up Instructions: No follow-ups on file.   Ortho Exam  Patient is alert, oriented, no adenopathy, well-dressed, normal affect, normal respiratory effort. Right knee no effusion no warmth no erythema.  Compartments are soft and nontender she is globally tender over the medial lateral joint line.  She is neurovascular intact.  Imaging: No results found. No images are attached to the encounter.  Labs: Lab Results  Component Value Date   HGBA1C 5.6 07/26/2006   ESRSEDRATE 22 02/10/2021   REPTSTATUS 03/17/2022 FINAL 03/16/2022   CULT (A) 03/16/2022    <10,000 COLONIES/mL INSIGNIFICANT GROWTH Performed at [facility], [place] [address] [place], [place] [address]    LABORGA NO GROWTH 06/10/2016     Lab Results  Component Value Date   ALBUMIN 4.2 06/14/2022   ALBUMIN 4.1 04/05/2022   ALBUMIN 3.9 03/16/2022    Lab Results  Component Value Date   MG 1.8 03/28/2021   Lab Results  Component Value Date    VD25OH 42.96 04/05/2022   VD25OH 45 03/20/2020   VD25OH 47.49 01/01/2016    No results found for: "PREALBUMIN"    Latest Ref Rng & Units 06/14/2022   12:40 PM 04/05/2022    1:59 PM 03/16/2022   10:18 AM  CBC EXTENDED  WBC 4.0 - 10.5 K/uL 24.2  32.3 Repeated and verified X2.  31.1   RBC 3.87 - 5.11 MIL/uL 3.64  3.52  3.47   Hemoglobin 12.0 - 15.0 g/dL 12.7  12.2  12.0   HCT 36.0 - 46.0 % 36.6  36.3  35.5   Platelets 150 - 400 K/uL 190  214.0  158   NEUT# 1.7 - 7.7 K/uL 3.8  3.3  3.8   Lymph# 0.7 - 4.0 K/uL 16.4  28.0  25.2      There is no height or weight on file to calculate BMI.  Orders:  No orders of the defined types were placed in this encounter.  No orders of the defined types were placed in this encounter.    Procedures: Large Joint Inj: R knee on 07/07/2022 3:08 PM Indications: pain and diagnostic evaluation Details: 25 G 1.5 in needle, anteromedial approach  Arthrogram: No  Medications: 80 mg methylPREDNISolone acetate 40 MG/ML; 2 mL lidocaine 1 %; 2  mL bupivacaine 0.25 % Outcome: tolerated well, no immediate complications Procedure, treatment alternatives, risks and benefits explained, specific risks discussed. Consent was given by the patient.    Clinical Data: No additional findings.  ROS:  All other systems negative, except as noted in the HPI. Review of Systems  Objective: Vital Signs: There were no vitals taken for this visit.  Specialty Comments:  No specialty comments available.  PMFS History: Patient Active Problem List   Diagnosis Date Noted  . Myalgia 04/05/2022  . CLL (chronic lymphocytic leukemia) 09/30/2021  . Cholangitis 03/26/2021  . Cholelithiasis 03/25/2021  . Elevated LFTs 03/25/2021  . Chronic hyponatremia 03/25/2021  . Bilateral primary osteoarthritis of knee 06/11/2019  . Pain in left shoulder 05/22/2019  . Vitamin D deficiency 12/25/2013  . SPINAL STENOSIS, LUMBAR 03/09/2010  . Osteoporosis 03/09/2010  . MIGRAINE,  OPHTHALMIC 10/29/2009  . ADENOMATOUS COLONIC POLYP 06/10/2009  . History of iron deficiency 05/06/2009  . Carotid artery disease ([place]) 07/28/2008  . Allergic rhinitis 03/20/2008  . DYSPHAGIA 03/12/2008  . Ganglion of tendon sheath 08/17/2007  . Hypothyroidism 11/15/2006  . Hyperlipidemia 11/07/2006  . Essential hypertension 10/18/2006  . GERD 10/18/2006  . DIVERTICULOSIS, COLON 10/18/2006   Past Medical History:  Diagnosis Date  . Anemia   . Anxiety   . Bifascicular block   . Diverticulitis   . Diverticulosis   . Gastroparesis   . GERD (gastroesophageal reflux disease)   . Glaucoma   . Hemorrhoids   . Hyperlipidemia   . Hypertension   . Hypertension   . IBS (irritable bowel syndrome)   . Rectal ulcer   . SCOLIOSIS, LUMBAR SPINE 03/09/2010   Qualifier: Diagnosis of  By: Arnoldo Morale MD, Balinda Quails   . Segmental colitis   . Tubular adenoma of colon 05/2009    Family History  Problem Relation Age of Onset  . Diabetes Mother   . Heart disease Mother   . Hyperlipidemia Mother   . Hypertension Mother   . Anuerysm Father        abdominal- heavy smoker  . Heart disease Father   . Hypertension Father   . Colon polyps Sister   . Lung cancer Sister        smoker  . Colon polyps Brother   . Diabetes Brother   . Hyperlipidemia Brother   . Hypertension Brother   . Heart disease Brother   . Colon cancer Paternal Grandmother        with possible stomach cancer  . Migraines Daughter   . Kidney Stones Daughter   . Colon polyps Daughter   . Breast cancer Daughter   . Cancer Daughter        salivary  . Colon cancer Maternal Aunt        Rectal cancer  . Crohn's disease Neg Hx   . Pancreatic cancer Neg Hx     Past Surgical History:  Procedure Laterality Date  . APPENDECTOMY    . ARCUATE KERATECTOMY    . BILIARY DILATION  03/27/2021   Procedure: BILIARY DILATION;  Surgeon: [name], MD;  Location: [facility];  Service: Gastroenterology;;  . CATARACT EXTRACTION,  BILATERAL  2015  . CHOLECYSTECTOMY N/A 03/28/2021   Procedure: LAPAROSCOPIC CHOLECYSTECTOMY;  Surgeon: Leighton Ruff, MD;  Location: [facility];  Service: General;  Laterality: N/A;  . double pallital tori bone removed     . ERCP N/A 03/27/2021   Procedure: ENDOSCOPIC RETROGRADE CHOLANGIOPANCREATOGRAPHY (ERCP);  Surgeon: [name], MD;  Location:  [facility];  Service: Gastroenterology;  Laterality: N/A;  . HERNIA REPAIR     umbilical  . KNEE SURGERY Bilateral   . morton's neuroma on rt foot and platar facial release    . MOUTH SURGERY     "bone shaved off roof of mouth"  . REMOVAL OF STONES  03/27/2021   Procedure: REMOVAL OF STONES;  Surgeon: [name], MD;  Location: [facility];  Service: Gastroenterology;;  . SALIVARY GLAND SURGERY     removal  . SPHINCTEROTOMY  03/27/2021   Procedure: Joan Mayans;  Surgeon: [name], MD;  Location: [facility];  Service: Gastroenterology;;  . TONSILLECTOMY     Social History   Occupational History  . Occupation: Glass blower/designer    Comment: retired  Tobacco Use  . Smoking status: Never  . Smokeless tobacco: Never  Vaping Use  . Vaping Use: Never used  Substance and Sexual Activity  . Alcohol use: Yes    Alcohol/week: 0.0 standard drinks of alcohol    Comment: rare  . Drug use: No  . Sexual activity: Not Currently

## 2022-07-08 DIAGNOSIS — Z4689 Encounter for fitting and adjustment of other specified devices: Secondary | ICD-10-CM | POA: Diagnosis not present

## 2022-07-08 DIAGNOSIS — N898 Other specified noninflammatory disorders of vagina: Secondary | ICD-10-CM | POA: Diagnosis not present

## 2022-07-08 DIAGNOSIS — N8110 Cystocele, unspecified: Secondary | ICD-10-CM | POA: Diagnosis not present

## 2022-07-08 DIAGNOSIS — N8111 Cystocele, midline: Secondary | ICD-10-CM | POA: Diagnosis not present

## 2022-07-08 DIAGNOSIS — N816 Rectocele: Secondary | ICD-10-CM | POA: Diagnosis not present

## 2022-07-12 ENCOUNTER — Telehealth: Payer: Self-pay | Admitting: Family Medicine

## 2022-07-12 NOTE — Telephone Encounter (Signed)
Patient states: - Called on 4/5 to speak to [place] about B12 levels - She wanted to ask questions about the supplements she is taking and to make sure her regimen is right   Patient is requesting a call back from [place].

## 2022-07-13 DIAGNOSIS — D1801 Hemangioma of skin and subcutaneous tissue: Secondary | ICD-10-CM | POA: Diagnosis not present

## 2022-07-13 DIAGNOSIS — L821 Other seborrheic keratosis: Secondary | ICD-10-CM | POA: Diagnosis not present

## 2022-07-13 DIAGNOSIS — D485 Neoplasm of uncertain behavior of skin: Secondary | ICD-10-CM | POA: Diagnosis not present

## 2022-07-13 DIAGNOSIS — D220 Melanocytic nevi of lip: Secondary | ICD-10-CM | POA: Diagnosis not present

## 2022-07-13 DIAGNOSIS — Z85828 Personal history of other malignant neoplasm of skin: Secondary | ICD-10-CM | POA: Diagnosis not present

## 2022-07-13 DIAGNOSIS — D225 Melanocytic nevi of trunk: Secondary | ICD-10-CM | POA: Diagnosis not present

## 2022-07-13 DIAGNOSIS — C44722 Squamous cell carcinoma of skin of right lower limb, including hip: Secondary | ICD-10-CM | POA: Diagnosis not present

## 2022-07-13 NOTE — Telephone Encounter (Signed)
Called and spoke with pt and all questions answered. 

## 2022-07-21 DIAGNOSIS — N950 Postmenopausal bleeding: Secondary | ICD-10-CM | POA: Diagnosis not present

## 2022-07-27 ENCOUNTER — Telehealth: Payer: Self-pay | Admitting: Physician Assistant

## 2022-07-27 NOTE — Telephone Encounter (Signed)
Talked to patient about gel injection.

## 2022-07-27 NOTE — Telephone Encounter (Signed)
VOB submitted for Orthovisc, bilateral knee 

## 2022-07-27 NOTE — Telephone Encounter (Signed)
Patient asking about her Gel injection series, wants to know if ins approved it. Please advise

## 2022-08-01 ENCOUNTER — Other Ambulatory Visit: Payer: Self-pay

## 2022-08-01 DIAGNOSIS — N950 Postmenopausal bleeding: Secondary | ICD-10-CM | POA: Diagnosis not present

## 2022-08-01 DIAGNOSIS — M170 Bilateral primary osteoarthritis of knee: Secondary | ICD-10-CM

## 2022-08-01 DIAGNOSIS — R109 Unspecified abdominal pain: Secondary | ICD-10-CM | POA: Diagnosis not present

## 2022-08-01 DIAGNOSIS — N8110 Cystocele, unspecified: Secondary | ICD-10-CM | POA: Diagnosis not present

## 2022-08-01 DIAGNOSIS — N952 Postmenopausal atrophic vaginitis: Secondary | ICD-10-CM | POA: Diagnosis not present

## 2022-08-05 DIAGNOSIS — N760 Acute vaginitis: Secondary | ICD-10-CM | POA: Diagnosis not present

## 2022-08-05 DIAGNOSIS — N952 Postmenopausal atrophic vaginitis: Secondary | ICD-10-CM | POA: Diagnosis not present

## 2022-08-05 DIAGNOSIS — Z4689 Encounter for fitting and adjustment of other specified devices: Secondary | ICD-10-CM | POA: Diagnosis not present

## 2022-08-05 DIAGNOSIS — N898 Other specified noninflammatory disorders of vagina: Secondary | ICD-10-CM | POA: Diagnosis not present

## 2022-08-10 ENCOUNTER — Other Ambulatory Visit (INDEPENDENT_AMBULATORY_CARE_PROVIDER_SITE_OTHER): Payer: Medicare Other

## 2022-08-10 ENCOUNTER — Ambulatory Visit (INDEPENDENT_AMBULATORY_CARE_PROVIDER_SITE_OTHER): Payer: Medicare Other | Admitting: Physician Assistant

## 2022-08-10 ENCOUNTER — Encounter: Payer: Self-pay | Admitting: Physician Assistant

## 2022-08-10 DIAGNOSIS — M25561 Pain in right knee: Secondary | ICD-10-CM

## 2022-08-10 DIAGNOSIS — M170 Bilateral primary osteoarthritis of knee: Secondary | ICD-10-CM

## 2022-08-10 MED ORDER — HYALURONAN 30 MG/2ML IX SOSY
30.0000 mg | PREFILLED_SYRINGE | INTRA_ARTICULAR | Status: AC | PRN
  Administered 2022-08-10: 30 mg via INTRA_ARTICULAR

## 2022-08-10 NOTE — Progress Notes (Signed)
Office Visit Note   Patient: Rebecca [name]           Date of Birth: [birth year]           MRN: [id number] Visit Date: 08/10/2022              Requested by: Shelva Majestic, MD [address] [place],  [place] [address] PCP: Shelva Majestic, MD  Chief Complaint  Patient presents with  . Right Knee - Pain  . Left Knee - Pain      HPI: Rebecca [name] is a former patient of Dr. Cleophas Dunker.  She has a history of advanced arthritis of both of her knees.  She comes in today for Orthovisc injections into both of her knees.  She did say she had some increased pain in her right knee no particular injury though she was at a funeral and sitting for quite a while.  She denies any swelling or erythema.  Assessment & Plan: Visit Diagnoses:  1. Acute pain of right knee   2. Bilateral primary osteoarthritis of knee     Plan: X-rays are reassuring I think she just irritated her arthritis I do not really appreciate an effusion.  Will go forward with Orthovisc follow-up in 1 week  Follow-Up Instructions: No follow-ups on file.   Ortho Exam  Patient is alert, oriented, no adenopathy, well-dressed, normal affect, normal respiratory effort. Bilateral knees no redness no effusion she is neurovascularly intact compartments are soft and nontender strength is intact  Imaging: XR KNEE 3 VIEW RIGHT  Result Date: 08/10/2022 Three-view radiographs of her knee demonstrate advanced tricompartmental arthritis with bone-on-bone changes.  No acute fractures noted no dislocation noted very similar to previous x-rays  No images are attached to the encounter.  Labs: Lab Results  Component Value Date   HGBA1C 5.6 07/26/2006   ESRSEDRATE 22 02/10/2021   REPTSTATUS 03/17/2022 FINAL 03/16/2022   CULT (A) 03/16/2022    <10,000 COLONIES/mL INSIGNIFICANT GROWTH Performed at [facility], [address] [place], [place] [address]    LABORGA NO GROWTH 06/10/2016     Lab Results  Component Value Date    ALBUMIN 4.2 06/14/2022   ALBUMIN 4.1 04/05/2022   ALBUMIN 3.9 03/16/2022    Lab Results  Component Value Date   MG 1.8 03/28/2021   Lab Results  Component Value Date   VD25OH 42.96 04/05/2022   VD25OH 45 03/20/2020   VD25OH 47.49 01/01/2016    No results found for: "PREALBUMIN"    Latest Ref Rng & Units 06/14/2022   12:40 PM 04/05/2022    1:59 PM 03/16/2022   10:18 AM  CBC EXTENDED  WBC 4.0 - 10.5 K/uL 24.2  32.3 Repeated and verified X2.  31.1   RBC 3.87 - 5.11 MIL/uL 3.64  3.52  3.47   Hemoglobin 12.0 - 15.0 g/dL 82.9  56.2  13.0   HCT 36.0 - 46.0 % 36.6  36.3  35.5   Platelets 150 - 400 K/uL 190  214.0  158   NEUT# 1.7 - 7.7 K/uL 3.8  3.3  3.8   Lymph# 0.7 - 4.0 K/uL 16.4  28.0  25.2      There is no height or weight on file to calculate BMI.  Orders:  Orders Placed This Encounter  Procedures  . XR KNEE 3 VIEW RIGHT   No orders of the defined types were placed in this encounter.    Procedures: Large Joint Inj: bilateral knee on  08/10/2022 2:05 PM Indications: pain and diagnostic evaluation Details: 22 G 1.5 in needle, anteromedial approach  Arthrogram: No  Medications (Right): 30 mg Hyaluronan 30 MG/2ML Medications (Left): 30 mg Hyaluronan 30 MG/2ML Outcome: tolerated well, no immediate complications Procedure, treatment alternatives, risks and benefits explained, specific risks discussed. Consent was given by the patient.    Clinical Data: No additional findings.  ROS:  All other systems negative, except as noted in the HPI. Review of Systems  Objective: Vital Signs: There were no vitals taken for this visit.  Specialty Comments:  No specialty comments available.  PMFS History: Patient Active Problem List   Diagnosis Date Noted  . Myalgia 04/05/2022  . CLL (chronic lymphocytic leukemia) ([facility]) 09/30/2021  . Cholangitis 03/26/2021  . Cholelithiasis 03/25/2021  . Elevated LFTs 03/25/2021  . Chronic hyponatremia 03/25/2021  . Bilateral  primary osteoarthritis of knee 06/11/2019  . Pain in left shoulder 05/22/2019  . Vitamin D deficiency 12/25/2013  . SPINAL STENOSIS, LUMBAR 03/09/2010  . Osteoporosis 03/09/2010  . MIGRAINE, OPHTHALMIC 10/29/2009  . ADENOMATOUS COLONIC POLYP 06/10/2009  . History of iron deficiency 05/06/2009  . Carotid artery disease ([facility]) 07/28/2008  . Allergic rhinitis 03/20/2008  . DYSPHAGIA 03/12/2008  . Ganglion of tendon sheath 08/17/2007  . Hypothyroidism 11/15/2006  . Hyperlipidemia 11/07/2006  . Essential hypertension 10/18/2006  . GERD 10/18/2006  . DIVERTICULOSIS, COLON 10/18/2006   Past Medical History:  Diagnosis Date  . Anemia   . Anxiety   . Bifascicular block   . Diverticulitis   . Diverticulosis   . Gastroparesis   . GERD (gastroesophageal reflux disease)   . Glaucoma   . Hemorrhoids   . Hyperlipidemia   . Hypertension   . Hypertension   . IBS (irritable bowel syndrome)   . Rectal ulcer   . SCOLIOSIS, LUMBAR SPINE 03/09/2010   Qualifier: Diagnosis of  By: Lovell Sheehan MD, Balinda Quails   . Segmental colitis ([facility])   . Tubular adenoma of colon 05/2009    Family History  Problem Relation Age of Onset  . Diabetes Mother   . Heart disease Mother   . Hyperlipidemia Mother   . Hypertension Mother   . Anuerysm Father        abdominal- heavy smoker  . Heart disease Father   . Hypertension Father   . Colon polyps Sister   . Lung cancer Sister        smoker  . Colon polyps Brother   . Diabetes Brother   . Hyperlipidemia Brother   . Hypertension Brother   . Heart disease Brother   . Colon cancer Paternal Grandmother        with possible stomach cancer  . Migraines Daughter   . Kidney Stones Daughter   . Colon polyps Daughter   . Breast cancer Daughter   . Cancer Daughter        salivary  . Colon cancer Maternal Aunt        Rectal cancer  . Crohn's disease Neg Hx   . Pancreatic cancer Neg Hx     Past Surgical History:  Procedure Laterality Date  . APPENDECTOMY    .  ARCUATE KERATECTOMY    . BILIARY DILATION  03/27/2021   Procedure: BILIARY DILATION;  Surgeon: Rachael Fee, MD;  Location: [facility];  Service: Gastroenterology;;  . CATARACT EXTRACTION, BILATERAL  2015  . CHOLECYSTECTOMY N/A 03/28/2021   Procedure: LAPAROSCOPIC CHOLECYSTECTOMY;  Surgeon: Romie Levee, MD;  Location: [facility];  Service: General;  Laterality: N/A;  . double pallital tori bone removed     . ERCP N/A 03/27/2021   Procedure: ENDOSCOPIC RETROGRADE CHOLANGIOPANCREATOGRAPHY (ERCP);  Surgeon: Rachael Fee, MD;  Location: [facility];  Service: Gastroenterology;  Laterality: N/A;  . HERNIA REPAIR     umbilical  . KNEE SURGERY Bilateral   . morton's neuroma on rt foot and platar facial release    . MOUTH SURGERY     "bone shaved off roof of mouth"  . REMOVAL OF STONES  03/27/2021   Procedure: REMOVAL OF STONES;  Surgeon: Rachael Fee, MD;  Location: [facility];  Service: Gastroenterology;;  . SALIVARY GLAND SURGERY     removal  . SPHINCTEROTOMY  03/27/2021   Procedure: Dennison Mascot;  Surgeon: Rachael Fee, MD;  Location: [facility];  Service: Gastroenterology;;  . TONSILLECTOMY     Social History   Occupational History  . Occupation: Print production planner    Comment: retired  Tobacco Use  . Smoking status: Never  . Smokeless tobacco: Never  Vaping Use  . Vaping Use: Never used  Substance and Sexual Activity  . Alcohol use: Yes    Alcohol/week: 0.0 standard drinks of alcohol    Comment: rare  . Drug use: No  . Sexual activity: Not Currently

## 2022-08-17 DIAGNOSIS — Z4689 Encounter for fitting and adjustment of other specified devices: Secondary | ICD-10-CM | POA: Diagnosis not present

## 2022-08-17 DIAGNOSIS — N898 Other specified noninflammatory disorders of vagina: Secondary | ICD-10-CM | POA: Diagnosis not present

## 2022-08-17 DIAGNOSIS — B3731 Acute candidiasis of vulva and vagina: Secondary | ICD-10-CM | POA: Diagnosis not present

## 2022-08-17 DIAGNOSIS — N952 Postmenopausal atrophic vaginitis: Secondary | ICD-10-CM | POA: Diagnosis not present

## 2022-08-18 ENCOUNTER — Ambulatory Visit (INDEPENDENT_AMBULATORY_CARE_PROVIDER_SITE_OTHER): Payer: Medicare Other | Admitting: Physician Assistant

## 2022-08-18 ENCOUNTER — Encounter: Payer: Self-pay | Admitting: Physician Assistant

## 2022-08-18 DIAGNOSIS — M170 Bilateral primary osteoarthritis of knee: Secondary | ICD-10-CM | POA: Diagnosis not present

## 2022-08-18 MED ORDER — HYALURONAN 30 MG/2ML IX SOSY
30.0000 mg | PREFILLED_SYRINGE | INTRA_ARTICULAR | Status: AC | PRN
  Administered 2022-08-18: 30 mg via INTRA_ARTICULAR

## 2022-08-18 NOTE — Progress Notes (Signed)
Office Visit Note   Patient: Rebecca [name]           Date of Birth: 25-Apr-1932           MRN: [id number] Visit Date: 08/18/2022              Requested by: Shelva Majestic, MD [address] [place],  [place] [address] PCP: Shelva Majestic, MD  Chief Complaint  Patient presents with  . Right Knee - Follow-up  . Left Knee - Follow-up      HPI: Rebecca [name] comes in today for her second Orthovisc injections into her bilateral knees.  She did well with the first injections and has no complaints  Assessment & Plan: Visit Diagnoses:  1. Bilateral primary osteoarthritis of knee     Plan: Will follow-up in 1 week for final injections.  Follow-Up Instructions: Return in about 1 week (around 08/25/2022).   Ortho Exam  Patient is alert, oriented, no adenopathy, well-dressed, normal affect, normal respiratory effort. Bilateral knees no erythema no effusion.  She is neurovascular intact compartments are soft and compressible  Imaging: No results found. No images are attached to the encounter.  Labs: Lab Results  Component Value Date   HGBA1C 5.6 07/26/2006   ESRSEDRATE 22 02/10/2021   REPTSTATUS 03/17/2022 FINAL 03/16/2022   CULT (A) 03/16/2022    <10,000 COLONIES/mL INSIGNIFICANT GROWTH Performed at [facility], [address], [place], [place] [address]    LABORGA NO GROWTH 06/10/2016     Lab Results  Component Value Date   ALBUMIN 4.2 06/14/2022   ALBUMIN 4.1 04/05/2022   ALBUMIN 3.9 03/16/2022    Lab Results  Component Value Date   MG 1.8 03/28/2021   Lab Results  Component Value Date   VD25OH 42.96 04/05/2022   VD25OH 45 03/20/2020   VD25OH 47.49 01/01/2016    No results found for: "PREALBUMIN"    Latest Ref Rng & Units 06/14/2022   12:40 PM 04/05/2022    1:59 PM 03/16/2022   10:18 AM  CBC EXTENDED  WBC 4.0 - 10.5 K/uL 24.2  32.3 Repeated and verified X2.  31.1   RBC 3.87 - 5.11 MIL/uL 3.64  3.52  3.47   Hemoglobin 12.0 - 15.0  g/dL 82.9  56.2  13.0   HCT 36.0 - 46.0 % 36.6  36.3  35.5   Platelets 150 - 400 K/uL 190  214.0  158   NEUT# 1.7 - 7.7 K/uL 3.8  3.3  3.8   Lymph# 0.7 - 4.0 K/uL 16.4  28.0  25.2      There is no height or weight on file to calculate BMI.  Orders:  No orders of the defined types were placed in this encounter.  No orders of the defined types were placed in this encounter.    Procedures: Large Joint Inj: bilateral knee on 08/18/2022 1:36 PM Indications: pain and diagnostic evaluation Details: 22 G 1.5 in needle, anteromedial approach  Arthrogram: No  Medications (Right): 30 mg Hyaluronan 30 MG/2ML Medications (Left): 30 mg Hyaluronan 30 MG/2ML Outcome: tolerated well, no immediate complications Procedure, treatment alternatives, risks and benefits explained, specific risks discussed. Consent was given by the patient.    Clinical Data: No additional findings.  ROS:  All other systems negative, except as noted in the HPI. Review of Systems  Objective: Vital Signs: There were no vitals taken for this visit.  Specialty Comments:  No specialty comments available.  PMFS History: Patient  Active Problem List   Diagnosis Date Noted  . Myalgia 04/05/2022  . CLL (chronic lymphocytic leukemia) ([facility]) 09/30/2021  . Cholangitis 03/26/2021  . Cholelithiasis 03/25/2021  . Elevated LFTs 03/25/2021  . Chronic hyponatremia 03/25/2021  . Bilateral primary osteoarthritis of knee 06/11/2019  . Pain in left shoulder 05/22/2019  . Vitamin D deficiency 12/25/2013  . SPINAL STENOSIS, LUMBAR 03/09/2010  . Osteoporosis 03/09/2010  . MIGRAINE, OPHTHALMIC 10/29/2009  . ADENOMATOUS COLONIC POLYP 06/10/2009  . History of iron deficiency 05/06/2009  . Carotid artery disease ([facility]) 07/28/2008  . Allergic rhinitis 03/20/2008  . DYSPHAGIA 03/12/2008  . Ganglion of tendon sheath 08/17/2007  . Hypothyroidism 11/15/2006  . Hyperlipidemia 11/07/2006  . Essential hypertension 10/18/2006  . GERD  10/18/2006  . DIVERTICULOSIS, COLON 10/18/2006   Past Medical History:  Diagnosis Date  . Anemia   . Anxiety   . Bifascicular block   . Diverticulitis   . Diverticulosis   . Gastroparesis   . GERD (gastroesophageal reflux disease)   . Glaucoma   . Hemorrhoids   . Hyperlipidemia   . Hypertension   . Hypertension   . IBS (irritable bowel syndrome)   . Rectal ulcer   . SCOLIOSIS, LUMBAR SPINE 03/09/2010   Qualifier: Diagnosis of  By: Lovell Sheehan MD, Balinda Quails   . Segmental colitis ([facility])   . Tubular adenoma of colon 05/2009    Family History  Problem Relation Age of Onset  . Diabetes Mother   . Heart disease Mother   . Hyperlipidemia Mother   . Hypertension Mother   . Anuerysm Father        abdominal- heavy smoker  . Heart disease Father   . Hypertension Father   . Colon polyps Sister   . Lung cancer Sister        smoker  . Colon polyps Brother   . Diabetes Brother   . Hyperlipidemia Brother   . Hypertension Brother   . Heart disease Brother   . Colon cancer Paternal Grandmother        with possible stomach cancer  . Migraines Daughter   . Kidney Stones Daughter   . Colon polyps Daughter   . Breast cancer Daughter   . Cancer Daughter        salivary  . Colon cancer Maternal Aunt        Rectal cancer  . Crohn's disease Neg Hx   . Pancreatic cancer Neg Hx     Past Surgical History:  Procedure Laterality Date  . APPENDECTOMY    . ARCUATE KERATECTOMY    . BILIARY DILATION  03/27/2021   Procedure: BILIARY DILATION;  Surgeon: Rachael Fee, MD;  Location: [facility];  Service: Gastroenterology;;  . CATARACT EXTRACTION, BILATERAL  2015  . CHOLECYSTECTOMY N/A 03/28/2021   Procedure: LAPAROSCOPIC CHOLECYSTECTOMY;  Surgeon: Romie Levee, MD;  Location: [facility];  Service: General;  Laterality: N/A;  . double pallital tori bone removed     . ERCP N/A 03/27/2021   Procedure: ENDOSCOPIC RETROGRADE CHOLANGIOPANCREATOGRAPHY (ERCP);  Surgeon: Rachael Fee, MD;   Location: [facility];  Service: Gastroenterology;  Laterality: N/A;  . HERNIA REPAIR     umbilical  . KNEE SURGERY Bilateral   . morton's neuroma on rt foot and platar facial release    . MOUTH SURGERY     "bone shaved off roof of mouth"  . REMOVAL OF STONES  03/27/2021   Procedure: REMOVAL OF STONES;  Surgeon: Rachael Fee, MD;  Location: [facility];  Service: Gastroenterology;;  . SALIVARY GLAND SURGERY     removal  . SPHINCTEROTOMY  03/27/2021   Procedure: SPHINCTEROTOMY;  Surgeon: Rachael Fee, MD;  Location: [facility];  Service: Gastroenterology;;  . TONSILLECTOMY     Social History   Occupational History  . Occupation: Print production planner    Comment: retired  Tobacco Use  . Smoking status: Never  . Smokeless tobacco: Never  Vaping Use  . Vaping Use: Never used  Substance and Sexual Activity  . Alcohol use: Yes    Alcohol/week: 0.0 standard drinks of alcohol    Comment: rare  . Drug use: No  . Sexual activity: Not Currently

## 2022-08-23 ENCOUNTER — Other Ambulatory Visit: Payer: Self-pay

## 2022-08-23 MED ORDER — ALPRAZOLAM 0.25 MG PO TABS: ORAL_TABLET | ORAL | 0 refills | Status: DC

## 2022-08-23 NOTE — Progress Notes (Signed)
sent 

## 2022-08-23 NOTE — Progress Notes (Signed)
Care Management & Coordination Services Pharmacy Note  08/26/2022 Name:  Rebecca [name] MRN:  [id number] DOB:  [birth year]  Summary: PharmD Fu visit.  BP elevated at last visit with oncology.  Patient reports high BP at most office visits.  Have asked her to have family check with her home monitor and report back to me if elevated.  Upcoming physical in July with PCP.  Due for lipids at that time.  Tolerating increased dose to twice weekly of statin well.  Last DEXA on my record is 2019 - reports obgyn is coordinating this.  Last T-score from 2019 shows -3.8 in radius.    Recommendations/Changes made from today's visit: Recheck lipids - could increase to three times per week if necessary Check BP at home - contact if elevated Coordinate DEXA with obgyn - Forteo? If necessary to treat  Follow up plan: FU 3 months CMA to call to get BP and assess statin use in July after PCP appt.   Subjective: Rebecca [name] is an 89 y.o. year old female who is a primary patient of Durene Cal, Aldine Contes, MD.  The care coordination team was consulted for assistance with disease management and care coordination needs.    Engaged with patient by telephone for follow up visit.  Recent office visits:  10/13/2021 OV (Fam Med) Allwardt, Alyssa M, PA-C; no medication changes indicated.   09/30/2021 OV (PCP) Shelva Majestic, MD; she has not been able to tolerate the rosuvastatin 20 mg due to a weakness/jellylike sensation in her legs. Also did not tolerate atorvastatin in the past due to weakness - She would like to see how she would do on just 10 mg of rosuvastatin and she is going to try that and update me within a month if she is tolerating that-    Recent consult visits:  12/20/2021 OV [facility]) Barnett Abu MD; no further information available.   12/14/2021 OV (Orthopedics) Persons, [place], [place]; We discussed using Voltaren gel which she has used in the past.     12/14/2021 OV (Oncology) Johney Maine, MD; no medication changes indicated.   11/26/2021 OV (Otolaryngology) Spainhour, Crisoforo Oxford, PA-C; no medication changes indicated.   11/25/2021 OV (Orthopedics) Valeria Batman, MD; no medication changes indicated.   11/17/2021 OV (Orthopedics) Persons, [place], [place]; no medication changes indicated.   11/12/2021 OV (Orthopedics) Persons, [place], [place]; no medication changes indicated.   11/04/2021 OV (Orthopedics) Valeria Batman, MD; no medication changes indicated.   10/27/2021 OV (Podiatry) Lenn Sink, DPM; no medication changes indicated.   Hospital visits:  None in previous 6 months   Objective:  Lab Results  Component Value Date   CREATININE 0.83 06/14/2022   BUN 11 06/14/2022   GFR 53.34 (L) 04/05/2022   GFRNONAA >60 06/14/2022   GFRAA >60 02/23/2016   NA 130 (L) 06/14/2022   K 4.3 06/14/2022   CALCIUM 10.0 06/14/2022   CO2 25 06/14/2022   GLUCOSE 102 (H) 06/14/2022    Lab Results  Component Value Date/Time   HGBA1C 5.6 07/26/2006 08:23 AM   GFR 53.34 (L) 04/05/2022 01:59 PM   GFR 71.09 04/09/2021 11:56 AM    Last diabetic Eye exam: No results found for: "HMDIABEYEEXA"  Last diabetic Foot exam: No results found for: "HMDIABFOOTEX"   Lab Results  Component Value Date   CHOL 267 (H) 04/05/2022   HDL 47.70 04/05/2022   LDLCALC 186 (H) 04/05/2022   LDLDIRECT 89.0 05/13/2020  TRIG 165.0 (H) 04/05/2022   CHOLHDL 6 04/05/2022       Latest Ref Rng & Units 06/14/2022   12:40 PM 04/05/2022    1:59 PM 03/16/2022   10:18 AM  Hepatic Function  Total Protein 6.5 - 8.1 g/dL 6.5  5.9  6.1   Albumin 3.5 - 5.0 g/dL 4.2  4.1  3.9   AST 15 - 41 U/L 18  18  20    ALT 0 - 44 U/L 10  12  16    Alk Phosphatase 38 - 126 U/L 74  66  54   Total Bilirubin 0.3 - 1.2 mg/dL 0.5  0.5  0.7     Lab Results  Component Value Date/Time   TSH 0.89 04/05/2022 01:59 PM   TSH 0.766 03/26/2021 03:38 AM   TSH 0.73 03/20/2020  11:21 AM   FREET4 1.43 (H) 03/26/2021 03:38 AM   FREET4 0.92 04/28/2011 12:44 PM       Latest Ref Rng & Units 06/14/2022   12:40 PM 04/05/2022    1:59 PM 03/16/2022   10:18 AM  CBC  WBC 4.0 - 10.5 K/uL 24.2  32.3 Repeated and verified X2.  31.1   Hemoglobin 12.0 - 15.0 g/dL 40.9  81.1  91.4   Hematocrit 36.0 - 46.0 % 36.6  36.3  35.5   Platelets 150 - 400 K/uL 190  214.0  158     Lab Results  Component Value Date/Time   VD25OH 42.96 04/05/2022 01:59 PM   VD25OH 45 03/20/2020 11:21 AM   VD25OH 47.49 01/01/2016 09:55 AM   VITAMINB12 190 (L) 04/05/2022 01:59 PM   VITAMINB12 366 03/20/2020 11:21 AM    Clinical ASCVD: Yes  The ASCVD Risk score (Arnett DK, et al., 2019) failed to calculate for the following reasons:   The 2019 ASCVD risk score is only valid for ages 75 to 19        12/27/2021   10:29 AM 09/16/2021   11:39 AM 05/18/2021   10:26 AM  Depression screen PHQ 2/9  Decreased Interest 0 0 0  Down, Depressed, Hopeless 0 0 0  PHQ - 2 Score 0 0 0  Altered sleeping  0 0  Tired, decreased energy  0 0  Change in appetite  0 0  Feeling bad or failure about yourself   0 0  Trouble concentrating  0 0  Moving slowly or fidgety/restless  0 0  Suicidal thoughts  0 0  PHQ-9 Score  0 0  Difficult doing work/chores  Not difficult at all      Social History   Tobacco Use  Smoking Status Never  Smokeless Tobacco Never   BP Readings from Last 3 Encounters:  06/14/22 (!) 158/68  04/05/22 138/70  03/16/22 (!) 165/89   Pulse Readings from Last 3 Encounters:  06/14/22 (!) 52  04/05/22 (!) 54  03/16/22 (!) 57   Wt Readings from Last 3 Encounters:  06/14/22 118 lb 9.6 oz (53.8 kg)  04/05/22 118 lb 9.6 oz (53.8 kg)  02/25/22 124 lb (56.2 kg)   BMI Readings from Last 3 Encounters:  06/14/22 19.74 kg/m  04/05/22 19.74 kg/m  02/25/22 20.63 kg/m    Allergies  Allergen Reactions   Levaquin [Levofloxacin] Other (See Comments)    Very nauseous and shakey    Sulfamethoxazole-Trimethoprim Nausea Only and Other (See Comments)    Shakiness, also   Lactose Intolerance (Gi) Diarrhea and Other (See Comments)    Makes the "stomach hurt"  Tramadol Hcl Nausea Only and Palpitations   Augmentin [Amoxicillin-Pot Clavulanate] Diarrhea   Ciprofloxacin Nausea Only and Other (See Comments)    Thrush and Shakiness also   Rosuvastatin Other (See Comments)    Caused restless legs   Zosyn [Piperacillin Sod-Tazobactam So] Diarrhea and Other (See Comments)    Severe diarrhea   Adhesive [Tape] Rash   Codeine Nausea Only   Pneumococcal Vaccine Polyvalent Rash and Other (See Comments)    Caused a RED AND RAISED RASH ON ARM    Medications Reviewed Today     Reviewed by Erroll Luna, [facility] (Pharmacist) on 08/26/22 at 1514  Med List Status: <None>   Medication Order Taking? Sig Documenting Provider Last Dose Status Informant  acetaminophen (TYLENOL) 500 MG tablet [id number]  Take 500-1,000 mg by mouth every 6 (six) hours as needed for mild pain or headache. [provider]  Active Self  ALPRAZolam (XANAX) 0.25 MG tablet [id number] No TAKE 1 TABLET BY MOUTH AT BEDTIME AS NEEDED. DO NOT DRIVE FOR 8 HOURS AFTER TAKING AND STOP IF ANY FALLS UNTIL NEXT DOCTOR VISIT  Patient not taking: Reported on 08/26/2022   Shelva Majestic, MD Not Taking Active   atorvastatin (LIPITOR) 10 MG tablet [id number]  Take 1 tablet (10 mg total) by mouth 2 (two) times a week. Shelva Majestic, MD  Active   Calcium Carb-Cholecalciferol (CALCIUM-VITAMIN D) 600-400 MG-UNIT TABS [id number]  Take 1 tablet by mouth daily. [provider]  Active Self           Med Note Antony Madura, Arn Medal   Fri Feb 25, 2022 10:39 PM) Patient said she also takes vitamin D-3 by itself  Cholecalciferol (VITAMIN D3) 2000 UNITS TABS [id number]  Take 1 tablet by mouth daily. [provider]  Active Self  diclofenac sodium (VOLTAREN) 1 % GEL [id number]  Apply 2 g topically 4 (four) times daily.   Patient taking differently: Apply 2 g topically 4 (four) times daily as needed (for pain).   Kristian Covey, MD  Active Self  doxycycline (VIBRA-TABS) 100 MG tablet [id number]  Take 100 mg by mouth 2 (two) times daily. [provider]  Active   esomeprazole (NEXIUM) 20 MG capsule [id number]  Take 20 mg by mouth 2 (two) times daily before a meal. [provider]  Active Self  estradiol (ESTRACE) 0.1 MG/GM vaginal cream [id number]  Place 0.5 g vaginally See admin instructions. Place 0.5 g vaginally two to three times a week [provider]  Active Self  levothyroxine (SYNTHROID) 88 MCG tablet [id number] Yes Take 1 tablet (88 mcg total) by mouth daily before breakfast. Shelva Majestic, MD Taking Active Self  loperamide (IMODIUM) 2 MG capsule [id number] Yes Take 1 capsule (2 mg total) by mouth 4 (four) times daily as needed for diarrhea or loose stools. Kommor, Madison, MD Taking Active   losartan (COZAAR) 100 MG tablet [id number] Yes Take 1 tablet (100 mg total) by mouth daily. Shelva Majestic, MD Taking Active Self  metoCLOPramide (REGLAN) 5 MG tablet [id number] Yes TAKE 1 TABLET BY MOUTH TWICE DAILY WITH MEALS  Patient taking differently: Take 5 mg by mouth 2 (two) times daily with a meal.   Burchette, Elberta Fortis, MD Taking Active Self  nitrofurantoin, macrocrystal-monohydrate, (MACROBID) 100 MG capsule [id number]  Take 100 mg by mouth every 12 (twelve) hours. [provider]  Active   nystatin-triamcinolone (MYCOLOG II) cream [id number]  Apply 1 Application topically See admin instructions. Apply to the affected  area 2 times a week as needed for irritation [provider]  Active Self    Discontinued 07/28/11 1010 (Reorder)   ondansetron (ZOFRAN-ODT) 4 MG disintegrating tablet [id number]  Take 1 tablet (4 mg total) by mouth every 8 (eight) hours as needed for nausea or vomiting. Kommor, Madison, MD  Active   timolol (TIMOPTIC) 0.5 % ophthalmic solution  [id number]  Place 1 drop into both eyes 2 (two) times daily. [provider]  Active Self           Med Note Lenor Derrick   [facility] Mar 27, 2021  7:14 PM)    vitamin B-12 (CYANOCOBALAMIN) 500 MCG tablet [id number] Yes Take 500 mcg by mouth every morning. [provider] Taking Active Self  vitamin C (ASCORBIC ACID) 500 MG tablet [id number] Yes Take 500 mg by mouth daily. [provider] Taking Active Self  VYZULTA 0.024 % SOLN [id number]  Place 1 drop into both eyes at bedtime. [provider]  Active Self            SDOH:  (Social Determinants of Health) assessments and interventions performed: No, done within the past year Financial Resource Strain: Low Risk  (12/27/2021)   Overall Financial Resource Strain (CARDIA)    Difficulty of Paying Living Expenses: Not hard at all   Food Insecurity: No Food Insecurity (03/01/2022)   Hunger Vital Sign    Worried About Running Out of Food in the Last Year: Never true    Ran Out of Food in the Last Year: Never true    SDOH Interventions    Flowsheet Row Telephone from 03/01/2022 in [facility] Care Coordination Clinical Support from 12/27/2021 in [facility] Patient Outreach Telephone from 11/16/2021 in [facility] Care Coordination Chronic Care Management from 03/06/2020 in [facility] [place] Clinical Support from 11/19/2019 in [facility] [place] Clinical Support from 05/09/2018 in [facility] [place]  SDOH Interventions        Food Insecurity Interventions Intervention Not Indicated Intervention Not Indicated Intervention Not Indicated -- Intervention Not Indicated --  Housing Interventions -- Intervention Not Indicated Intervention Not Indicated -- Intervention Not Indicated --  Transportation Interventions Intervention Not Indicated Intervention Not Indicated Intervention  Not Indicated Intervention Not Indicated Intervention Not Indicated --  Depression Interventions/Treatment  -- -- -- -- PHQ2-9 Score <4 Follow-up Not Indicated PHQ2-9 Score <4 Follow-up Not Indicated  Financial Strain Interventions -- Intervention Not Indicated -- Intervention Not Indicated -- --  Physical Activity Interventions -- Intervention Not Indicated -- -- Intervention Not Indicated --  Stress Interventions -- Intervention Not Indicated -- -- Intervention Not Indicated --  Social Connections Interventions -- Intervention Not Indicated -- -- Intervention Not Indicated --      SDOH Screenings   Food Insecurity: No Food Insecurity (03/01/2022)  Housing: Low Risk  (12/27/2021)  Transportation Needs: No Transportation Needs (03/01/2022)  Alcohol Screen: Low Risk  (11/19/2019)  Depression (PHQ2-9): Low Risk  (12/27/2021)  Financial Resource Strain: Low Risk  (12/27/2021)  Physical Activity: Insufficiently Active (12/27/2021)  Social Connections: Moderately Isolated (12/27/2021)  Stress: No Stress Concern Present (12/27/2021)  Tobacco Use: Low Risk  (08/24/2022)    Medication Assistance: None required.  Patient affirms current coverage meets needs.  Medication Access: Within the past 30 days, how often has patient missed a dose of medication? 0 Is a pillbox or other method used to improve adherence? Yes  Factors that  may affect medication adherence? no barriers identified Are meds synced by current pharmacy? No  Are meds delivered by current pharmacy? No  Does patient experience delays in picking up medications due to transportation concerns? No   [facility] Reviewed: Is patient disadvantaged to use [facility]?: No  Current Rx insurance plan: Medicare Name and location of Current pharmacy:  [id number] G[place] Otto, [place] - [address] AT [facility] [address] [place] [address] Phone: [phone] Fax:  [phone]  [facility] #[id number] [place] Otto, [place] - [address] AT [facility] [address] [place] [address] Phone: [phone] Fax: [phone]  [facility] services reviewed with patient today?: Yes  Patient requests to transfer care to [facility]?: No  Reason patient declined to change pharmacies: Loyalty to other pharmacy/Patient preference and Patient is already actively enrolled with [facility]  Compliance/Adherence/Medication fill history: Care Gaps: None  Star-Rating Drugs: Atorvastatin 10mg  once weekly 90ds 06/15/22 Losartan 100mg  06/22/22 90ds   Assessment/Plan     Hypertension (BP goal <140/90) 08/26/22 -Uncontrolled, based on a few recent OV - has been controlled previously in office -Current treatment: Losartan 100 mg daily - Appropriate, Query Effective -Medications previously tried: none  -Current home readings: not checking, reports her son can check some when he comes over -Current dietary habits: limits salt intake -Current exercise habits: limited, will walk around the house with cane.  Reports outside of her house is uneven.  Fall risk so advised against this if she has a good place in her house to walk. -Denies hypotensive/hypertensive symptoms -Educated on BP goals and benefits of medications for prevention of heart attack, stroke and kidney damage; Importance of home blood pressure monitoring; Recommend no changes to medications at this time.  Hard to make judgements as she reports most if office readings are stressful and usually higher than normal.  I have asked her to have her son check them occasionally and record.  My phone number was provided in case she needs to call someone if consistently elevated. CMA to call in 2-4 weeks to assess home BP if we have not heard from her by then.  Hyperlipidemia/CAD: (LDL goal < 70) 04/25/22 -Uncontrolled, based on last lipid screenings - most recent LDL is 186 -Current  treatment: Atorvastatin 10mg  twice weekly - Appropriate, Query Effective -Medications previously tried: simvastatin (myalgias), Crestor -Current dietary patterns: did not discuss -Current exercise habits: limited -Educated on Cholesterol goals;  Benefits of statin for ASCVD risk reduction; Importance of limiting foods high in cholesterol; Exercise goal of 150 minutes per week; -Counseled on diet and exercise extensively She tolerated increase to twice weekly atorvastatin 10mg  fine.  Has upcoming physical with Dr. Durene Cal and requests full blood workup at this time.  Recommend lipids at this time, consider increase to three times per week with continued elevation of LDL.  Patient agreeable to this plan.  CMA to FU on labs in July - will assess tolerance to increased dose if we opt for this after the lab work returns.  Great job with adherence thus far!  Anxiety (Goal: minimize symptoms) -Controlled, not assessed -Current treatment: Alprazolam 0.25 mg 1 tablet at bedtime as needed - Appropriate, Effective, Safe, Accessible -Medications previously tried/failed: none -GAD7: n/a -Educated on Benefits of medication for symptom control -Recommended to continue current medication Counseled on continued use of alprazolam sparingly due to long term risks  Osteopenia (Goal prevent fractures) 08/26/22 -  Query Controlled - managed by OBGYN Dr. Ernestina Penna at [facility] -Last DEXA Scan: not able to access records from OBGYN   T-Score femoral neck: n/a  T-Score total hip: n/a  T-Score lumbar spine: n/a  T-Score forearm radius: n/a  10-year probability of major osteoporotic fracture: n/a  10-year probability of hip fracture: n/a -Per chart last dose of Reclast was in 2019.  Last available DEXA shows T-score of radius of -3.8 -Current treatment  Vitamin D 2000 units 1 tablet daily - Appropriate, Effective, Safe, Accessible Calcium carbonate-vit D 600-200 mg-unit 1 tablet 2 times daily - Appropriate,  Effective, Safe, Accessible -Medications previously tried: Reclast (finished 5 years of treatment) -We need DEXA scan to determine best course of action for patient.  She reports that she is probably due and has upcoming visit with her next week.  She will mention DEXA at that time.  In the mean time we will coordinate for records to be faxed over so that we have access.  Due to fall risk and previous DEXA.  Likely will need treatment pending DEXA results, could consider Forteo if affordable and patient agrees.  Caution walking and make sure to use cane to avoid falls.  Coordinate with gyn for DEXA and treatment options. CMA to coordinate this through phone call.   Neuropathy (Goal: minimize symptoms) -Controlled -Current treatment  No medications -Medications previously tried: gabapentin -Recommended to continue current medication  GERD (Goal: minimize symptoms) -Controlled -Current treatment  Esomeprazole 20 mg 1 capsule twice daily - Appropriate, Effective, Safe, Accessible Metoclopramide 5 mg 1 tablet daily (written as twice daily) - Appropriate, Effective, Safe, Accessible -Medications previously tried: none  -Counseled on diet and exercise extensively Recommended to continue current medications  Health Maintenance -Vaccine gaps: tetanus (too expensive), Prevnar -Current therapy:  Vitamin B 12 500 mcg 1 tablets daily Diclofenac gel 1% as needed Vitamin C 500 mg 1 tablet daily -Educated on Cost vs benefit of each product must be carefully weighed by individual consumer -Patient is satisfied with current therapy and denies issues -Recommended to continue current medication  Patient Goals/Self-Care Activities Patient will:  - take medications as prescribed check blood pressure weekly, document, and provide at future appointments target a minimum of 150 minutes of moderate intensity exercise weekly  Follow Up Plan: The patient has been provided with contact information for the  care management team and has been advised to call with any health related questions or concerns.         Willa Frater, PharmD Clinical Pharmacist  [facility] [phone]

## 2022-08-24 ENCOUNTER — Encounter: Payer: Self-pay | Admitting: Physician Assistant

## 2022-08-24 ENCOUNTER — Ambulatory Visit (INDEPENDENT_AMBULATORY_CARE_PROVIDER_SITE_OTHER): Payer: Medicare Other | Admitting: Physician Assistant

## 2022-08-24 DIAGNOSIS — C44722 Squamous cell carcinoma of skin of right lower limb, including hip: Secondary | ICD-10-CM | POA: Diagnosis not present

## 2022-08-24 DIAGNOSIS — Z85828 Personal history of other malignant neoplasm of skin: Secondary | ICD-10-CM | POA: Diagnosis not present

## 2022-08-24 DIAGNOSIS — M170 Bilateral primary osteoarthritis of knee: Secondary | ICD-10-CM | POA: Diagnosis not present

## 2022-08-24 MED ORDER — HYALURONAN 30 MG/2ML IX SOSY
30.0000 mg | PREFILLED_SYRINGE | INTRA_ARTICULAR | Status: AC | PRN
  Administered 2022-08-24: 30 mg via INTRA_ARTICULAR

## 2022-08-24 NOTE — Progress Notes (Signed)
Office Visit Note   Patient: JOLE BRANNAM           Date of Birth: Apr 29, 1932           MRN: [id number] Visit Date: 08/24/2022              Requested by: Shelva Majestic, MD [address] [place],  [place] [address] PCP: Shelva Majestic, MD  Chief Complaint  Patient presents with  . Right Knee - Follow-up  . Left Knee - Follow-up      HPI: Makeena is a pleasant 89 year old woman who comes in for her third Orthovisc injection into both of her knees.  She has had these in the past and found them helpful.  Has not had any reactions to previous injections  Assessment & Plan: Visit Diagnoses: Osteoarthritis bilateral knees  Plan: Knees were injected without difficulty she may follow-up as needed  Follow-Up Instructions: Return if symptoms worsen or fail to improve.   Ortho Exam  Patient is alert, oriented, no adenopathy, well-dressed, normal affect, normal respiratory effort. Bilateral knees no erythema no effusions compartments are soft and nontender she is neurovascularly intact  Imaging: No results found. No images are attached to the encounter.  Labs: Lab Results  Component Value Date   HGBA1C 5.6 07/26/2006   ESRSEDRATE 22 02/10/2021   REPTSTATUS 03/17/2022 FINAL 03/16/2022   CULT (A) 03/16/2022    <10,000 COLONIES/mL INSIGNIFICANT GROWTH Performed at [facility], [address] [place], [place] [address]    LABORGA NO GROWTH 06/10/2016     Lab Results  Component Value Date   ALBUMIN 4.2 06/14/2022   ALBUMIN 4.1 04/05/2022   ALBUMIN 3.9 03/16/2022    Lab Results  Component Value Date   MG 1.8 03/28/2021   Lab Results  Component Value Date   VD25OH 42.96 04/05/2022   VD25OH 45 03/20/2020   VD25OH 47.49 01/01/2016    No results found for: "PREALBUMIN"    Latest Ref Rng & Units 06/14/2022   12:40 PM 04/05/2022    1:59 PM 03/16/2022   10:18 AM  CBC EXTENDED  WBC 4.0 - 10.5 K/uL 24.2  32.3 Repeated and verified X2.  31.1   RBC  3.87 - 5.11 MIL/uL 3.64  3.52  3.47   Hemoglobin 12.0 - 15.0 g/dL 82.9  56.2  13.0   HCT 36.0 - 46.0 % 36.6  36.3  35.5   Platelets 150 - 400 K/uL 190  214.0  158   NEUT# 1.7 - 7.7 K/uL 3.8  3.3  3.8   Lymph# 0.7 - 4.0 K/uL 16.4  28.0  25.2      There is no height or weight on file to calculate BMI.  Orders:  No orders of the defined types were placed in this encounter.  No orders of the defined types were placed in this encounter.    Procedures: Large Joint Inj: bilateral knee on 08/24/2022 1:01 PM Indications: pain and diagnostic evaluation Details: 22 G 1.5 in needle, anteromedial approach  Arthrogram: No  Medications (Right): 30 mg Hyaluronan 30 MG/2ML Medications (Left): 30 mg Hyaluronan 30 MG/2ML Outcome: tolerated well, no immediate complications Procedure, treatment alternatives, risks and benefits explained, specific risks discussed. Consent was given by the patient.    Clinical Data: No additional findings.  ROS:  All other systems negative, except as noted in the HPI. Review of Systems  Objective: Vital Signs: There were no vitals taken for this visit.  Specialty Comments:  No specialty comments available.  PMFS History: Patient Active Problem List   Diagnosis Date Noted  . Myalgia 04/05/2022  . CLL (chronic lymphocytic leukemia) (HCC) 09/30/2021  . Cholangitis 03/26/2021  . Cholelithiasis 03/25/2021  . Elevated LFTs 03/25/2021  . Chronic hyponatremia 03/25/2021  . Bilateral primary osteoarthritis of knee 06/11/2019  . Pain in left shoulder 05/22/2019  . Vitamin D deficiency 12/25/2013  . SPINAL STENOSIS, LUMBAR 03/09/2010  . Osteoporosis 03/09/2010  . MIGRAINE, OPHTHALMIC 10/29/2009  . ADENOMATOUS COLONIC POLYP 06/10/2009  . History of iron deficiency 05/06/2009  . Carotid artery disease (HCC) 07/28/2008  . Allergic rhinitis 03/20/2008  . DYSPHAGIA 03/12/2008  . Ganglion of tendon sheath 08/17/2007  . Hypothyroidism 11/15/2006  .  Hyperlipidemia 11/07/2006  . Essential hypertension 10/18/2006  . GERD 10/18/2006  . DIVERTICULOSIS, COLON 10/18/2006   Past Medical History:  Diagnosis Date  . Anemia   . Anxiety   . Bifascicular block   . Diverticulitis   . Diverticulosis   . Gastroparesis   . GERD (gastroesophageal reflux disease)   . Glaucoma   . Hemorrhoids   . Hyperlipidemia   . Hypertension   . Hypertension   . IBS (irritable bowel syndrome)   . Rectal ulcer   . SCOLIOSIS, LUMBAR SPINE 03/09/2010   Qualifier: Diagnosis of  By: Lovell Sheehan MD, Balinda Quails   . Segmental colitis (HCC)   . Tubular adenoma of colon 05/2009    Family History  Problem Relation Age of Onset  . Diabetes Mother   . Heart disease Mother   . Hyperlipidemia Mother   . Hypertension Mother   . Anuerysm Father        abdominal- heavy smoker  . Heart disease Father   . Hypertension Father   . Colon polyps Sister   . Lung cancer Sister        smoker  . Colon polyps Brother   . Diabetes Brother   . Hyperlipidemia Brother   . Hypertension Brother   . Heart disease Brother   . Colon cancer Paternal Grandmother        with possible stomach cancer  . Migraines Daughter   . Kidney Stones Daughter   . Colon polyps Daughter   . Breast cancer Daughter   . Cancer Daughter        salivary  . Colon cancer Maternal Aunt        Rectal cancer  . Crohn's disease Neg Hx   . Pancreatic cancer Neg Hx     Past Surgical History:  Procedure Laterality Date  . APPENDECTOMY    . ARCUATE KERATECTOMY    . BILIARY DILATION  03/27/2021   Procedure: BILIARY DILATION;  Surgeon: Rachael Fee, MD;  Location: [facility];  Service: Gastroenterology;;  . CATARACT EXTRACTION, BILATERAL  2015  . CHOLECYSTECTOMY N/A 03/28/2021   Procedure: LAPAROSCOPIC CHOLECYSTECTOMY;  Surgeon: Romie Levee, MD;  Location: [facility];  Service: General;  Laterality: N/A;  . double pallital tori bone removed     . ERCP N/A 03/27/2021   Procedure: ENDOSCOPIC RETROGRADE  CHOLANGIOPANCREATOGRAPHY (ERCP);  Surgeon: Rachael Fee, MD;  Location: [facility];  Service: Gastroenterology;  Laterality: N/A;  . HERNIA REPAIR     umbilical  . KNEE SURGERY Bilateral   . morton's neuroma on rt foot and platar facial release    . MOUTH SURGERY     "bone shaved off roof of mouth"  . REMOVAL OF STONES  03/27/2021   Procedure: REMOVAL OF STONES;  Surgeon: Rachael Fee, MD;  Location: [facility];  Service: Gastroenterology;;  . SALIVARY GLAND SURGERY     removal  . SPHINCTEROTOMY  03/27/2021   Procedure: Dennison Mascot;  Surgeon: Rachael Fee, MD;  Location: [facility];  Service: Gastroenterology;;  . TONSILLECTOMY     Social History   Occupational History  . Occupation: Print production planner    Comment: retired  Tobacco Use  . Smoking status: Never  . Smokeless tobacco: Never  Vaping Use  . Vaping Use: Never used  Substance and Sexual Activity  . Alcohol use: Yes    Alcohol/week: 0.0 standard drinks of alcohol    Comment: rare  . Drug use: No  . Sexual activity: Not Currently

## 2022-08-26 ENCOUNTER — Ambulatory Visit: Payer: Medicare Other | Admitting: Pharmacist

## 2022-08-31 DIAGNOSIS — N8110 Cystocele, unspecified: Secondary | ICD-10-CM | POA: Diagnosis not present

## 2022-08-31 DIAGNOSIS — N816 Rectocele: Secondary | ICD-10-CM | POA: Diagnosis not present

## 2022-08-31 DIAGNOSIS — Z4689 Encounter for fitting and adjustment of other specified devices: Secondary | ICD-10-CM | POA: Diagnosis not present

## 2022-08-31 DIAGNOSIS — N950 Postmenopausal bleeding: Secondary | ICD-10-CM | POA: Diagnosis not present

## 2022-08-31 DIAGNOSIS — N898 Other specified noninflammatory disorders of vagina: Secondary | ICD-10-CM | POA: Diagnosis not present

## 2022-08-31 DIAGNOSIS — N760 Acute vaginitis: Secondary | ICD-10-CM | POA: Diagnosis not present

## 2022-09-19 ENCOUNTER — Encounter: Payer: Self-pay | Admitting: Hematology

## 2022-09-21 DIAGNOSIS — D485 Neoplasm of uncertain behavior of skin: Secondary | ICD-10-CM | POA: Diagnosis not present

## 2022-09-21 DIAGNOSIS — L570 Actinic keratosis: Secondary | ICD-10-CM | POA: Diagnosis not present

## 2022-09-21 DIAGNOSIS — Z85828 Personal history of other malignant neoplasm of skin: Secondary | ICD-10-CM | POA: Diagnosis not present

## 2022-09-21 DIAGNOSIS — L928 Other granulomatous disorders of the skin and subcutaneous tissue: Secondary | ICD-10-CM | POA: Diagnosis not present

## 2022-09-28 ENCOUNTER — Telehealth (INDEPENDENT_AMBULATORY_CARE_PROVIDER_SITE_OTHER): Payer: Medicare Other | Admitting: Family Medicine

## 2022-09-28 ENCOUNTER — Encounter: Payer: Self-pay | Admitting: Family Medicine

## 2022-09-28 VITALS — Ht 65.0 in | Wt 118.0 lb

## 2022-09-28 DIAGNOSIS — C9110 Chronic lymphocytic leukemia of B-cell type not having achieved remission: Secondary | ICD-10-CM | POA: Diagnosis not present

## 2022-09-28 DIAGNOSIS — M62838 Other muscle spasm: Secondary | ICD-10-CM

## 2022-09-28 DIAGNOSIS — M48062 Spinal stenosis, lumbar region with neurogenic claudication: Secondary | ICD-10-CM | POA: Diagnosis not present

## 2022-09-28 MED ORDER — CYCLOBENZAPRINE HCL 10 MG PO TABS: 10.0000 mg | ORAL_TABLET | Freq: Every evening | ORAL | 0 refills | Status: DC | PRN

## 2022-09-28 MED ORDER — PREDNISONE 10 MG PO TABS: ORAL_TABLET | ORAL | 0 refills | Status: DC

## 2022-09-28 NOTE — Progress Notes (Signed)
Virtual Visit via Video Note  Subjective  CC:  Chief Complaint  Patient presents with   back spasm    I connected with Rebecca [name] on 09/28/22 at  2:00 PM EDT by a video enabled telemedicine application and verified that I am speaking with the correct person using two identifiers. Location patient: Home Location provider: [facility] [address] Office Persons participating in the virtual visit: Rebecca [name], Rebecca Willow Ora, MD Trudie Reed CMA  I discussed the limitations of evaluation and management by telemedicine and the availability of in person appointments. The patient expressed understanding and agreed to proceed. HPI: Rebecca [name] is a 89 y.o. female who was contacted today to address the problems listed above in the chief complaint. 89 year old female with known lumbar stenosis treated chronically by neurosurgery with epidural steroid injections presents due to several day history of increased muscle spasms, worse at night.  Also with sciatic pain down her right leg.  No weakness, bowel or bladder incontinence or severe pain however rolling over in bed can be difficult due to the night muscle spasms.  She is requesting prednisone and muscle relaxers.  I have reviewed her chart.  She has been treated, although several years ago with Flexeril.  She reports that this worked well and she did not have any side effects.  She is aware of the risks.  Dr. Danielle Dess also who gives her prednisone intermittently.  Her last use of the prednisone pack was about a year ago she reports.  She has no urinary irritative symptoms.  Mental status is good.  She is able to ambulate.  Assessment  1. Spinal stenosis of lumbar region with neurogenic claudication   2. Muscle spasm   3. CLL (chronic lymphocytic leukemia) (HCC)      Plan  Spinal stenosis with radicular symptoms and muscle spasms: Patient appears nontoxic.  She seems to have a clear understanding of her  disorder and etiology of her problems.  I counseled her on treatment options.  We elected Flexeril to be used at night as needed with caution and prednisone.  She has the diagnosis of CLL and has an upcoming complete physical with lab work.  I did warn her that prednisone can falsely elevate the white blood cell count.  She will let us know if she has any worsening symptoms.  To emergency room for any weakness or bowel or bladder incontinence was recommended. I discussed the assessment and treatment plan with the patient. The patient was provided an opportunity to ask questions and all were answered. The patient agreed with the plan and demonstrated an understanding of the instructions.   The patient was advised to call back or seek an in-person evaluation if the symptoms worsen or if the condition fails to improve as anticipated. Follow up: As scheduled for complete physical with PCP 10/04/2022  Meds ordered this encounter  Medications   cyclobenzaprine (FLEXERIL) 10 MG tablet    Sig: Take 1 tablet (10 mg total) by mouth at bedtime as needed for muscle spasms.    Dispense:  30 tablet    Refill:  0   predniSONE (DELTASONE) 10 MG tablet    Sig: Take 4 tabs qd x 2 days, 3 qd x 2 days, 2 qd x 2d, 1qd x 3 days    Dispense:  21 tablet    Refill:  0      I reviewed the patients updated PMH, FH, and SocHx.  Patient Active Problem List   Diagnosis Date Noted   Myalgia 04/05/2022   CLL (chronic lymphocytic leukemia) (HCC) 09/30/2021   Cholangitis 03/26/2021   Cholelithiasis 03/25/2021   Elevated LFTs 03/25/2021   Chronic hyponatremia 03/25/2021   Bilateral primary osteoarthritis of knee 06/11/2019   Pain in left shoulder 05/22/2019   Vitamin D deficiency 12/25/2013   SPINAL STENOSIS, LUMBAR 03/09/2010   Osteoporosis 03/09/2010   MIGRAINE, OPHTHALMIC 10/29/2009   ADENOMATOUS COLONIC POLYP 06/10/2009   History of iron deficiency 05/06/2009   Carotid artery disease (HCC) 07/28/2008    Allergic rhinitis 03/20/2008   DYSPHAGIA 03/12/2008   Ganglion of tendon sheath 08/17/2007   Hypothyroidism 11/15/2006   Hyperlipidemia 11/07/2006   Essential hypertension 10/18/2006   GERD 10/18/2006   DIVERTICULOSIS, COLON 10/18/2006   Current Meds  Medication Sig   acetaminophen (TYLENOL) 500 MG tablet Take 500-1,000 mg by mouth every 6 (six) hours as needed for mild pain or headache.   atorvastatin (LIPITOR) 10 MG tablet Take 1 tablet (10 mg total) by mouth 2 (two) times a week.   Calcium Carb-Cholecalciferol (CALCIUM-VITAMIN D) 600-400 MG-UNIT TABS Take 1 tablet by mouth daily.   Cholecalciferol (VITAMIN D3) 2000 UNITS TABS Take 1 tablet by mouth daily.   cyclobenzaprine (FLEXERIL) 10 MG tablet Take 1 tablet (10 mg total) by mouth at bedtime as needed for muscle spasms.   diclofenac sodium (VOLTAREN) 1 % GEL Apply 2 g topically 4 (four) times daily. (Patient taking differently: Apply 2 g topically 4 (four) times daily as needed (for pain).)   esomeprazole (NEXIUM) 20 MG capsule Take 20 mg by mouth 2 (two) times daily before a meal.   estradiol (ESTRACE) 0.1 MG/GM vaginal cream Place 0.5 g vaginally See admin instructions. Place 0.5 g vaginally two to three times a week   levothyroxine (SYNTHROID) 88 MCG tablet Take 1 tablet (88 mcg total) by mouth daily before breakfast.   loperamide (IMODIUM) 2 MG capsule Take 1 capsule (2 mg total) by mouth 4 (four) times daily as needed for diarrhea or loose stools.   losartan (COZAAR) 100 MG tablet Take 1 tablet (100 mg total) by mouth daily.   metoCLOPramide (REGLAN) 5 MG tablet TAKE 1 TABLET BY MOUTH TWICE DAILY WITH MEALS (Patient taking differently: Take 5 mg by mouth 2 (two) times daily with a meal.)   nystatin-triamcinolone (MYCOLOG II) cream Apply 1 Application topically See admin instructions. Apply to the affected area 2 times a week as needed for irritation   ondansetron (ZOFRAN-ODT) 4 MG disintegrating tablet Take 1 tablet (4 mg total) by  mouth every 8 (eight) hours as needed for nausea or vomiting.   predniSONE (DELTASONE) 10 MG tablet Take 4 tabs qd x 2 days, 3 qd x 2 days, 2 qd x 2d, 1qd x 3 days   timolol (TIMOPTIC) 0.5 % ophthalmic solution Place 1 drop into both eyes 2 (two) times daily.   vitamin B-12 (CYANOCOBALAMIN) 500 MCG tablet Take 500 mcg by mouth every morning.   vitamin C (ASCORBIC ACID) 500 MG tablet Take 500 mg by mouth daily.   VYZULTA 0.024 % SOLN Place 1 drop into both eyes at bedtime.    Allergies: Patient is allergic to levaquin [levofloxacin], sulfamethoxazole-trimethoprim, lactose intolerance (gi), tramadol hcl, augmentin [amoxicillin-pot clavulanate], ciprofloxacin, rosuvastatin, zosyn [piperacillin sod-tazobactam so], adhesive [tape], codeine, and pneumococcal vaccine polyvalent. Family History: Patient family history includes Anuerysm in her father; Breast cancer in her daughter; Cancer in her daughter; Colon cancer in her maternal aunt  and paternal grandmother; Colon polyps in her brother, daughter, and sister; Diabetes in her brother and mother; Heart disease in her brother, father, and mother; Hyperlipidemia in her brother and mother; Hypertension in her brother, father, and mother; Kidney Stones in her daughter; Lung cancer in her sister; Migraines in her daughter. Social History:  Patient  reports that she has never smoked. She has never used smokeless tobacco. She reports current alcohol use. She reports that she does not use drugs.  Review of Systems: Constitutional: Negative for fever malaise or anorexia Cardiovascular: negative for chest pain Respiratory: negative for SOB or persistent cough Gastrointestinal: negative for abdominal pain  OBJECTIVE Vitals: Ht 5\' 5"  (1.651 m)   Wt 118 lb (53.5 kg)   BMI 19.64 kg/m  General: no acute distress , A&Ox3 Appears comfortable Sitting  Willow Ora, MD

## 2022-10-04 ENCOUNTER — Telehealth: Payer: Self-pay

## 2022-10-04 ENCOUNTER — Ambulatory Visit (INDEPENDENT_AMBULATORY_CARE_PROVIDER_SITE_OTHER): Payer: Medicare Other | Admitting: Family Medicine

## 2022-10-04 ENCOUNTER — Encounter: Payer: Self-pay | Admitting: Family Medicine

## 2022-10-04 VITALS — BP 152/76 | HR 58 | Temp 97.2°F | Ht 65.0 in | Wt 118.0 lb

## 2022-10-04 DIAGNOSIS — E039 Hypothyroidism, unspecified: Secondary | ICD-10-CM

## 2022-10-04 DIAGNOSIS — R739 Hyperglycemia, unspecified: Secondary | ICD-10-CM

## 2022-10-04 DIAGNOSIS — I10 Essential (primary) hypertension: Secondary | ICD-10-CM

## 2022-10-04 DIAGNOSIS — E538 Deficiency of other specified B group vitamins: Secondary | ICD-10-CM | POA: Diagnosis not present

## 2022-10-04 DIAGNOSIS — E785 Hyperlipidemia, unspecified: Secondary | ICD-10-CM | POA: Diagnosis not present

## 2022-10-04 DIAGNOSIS — C9110 Chronic lymphocytic leukemia of B-cell type not having achieved remission: Secondary | ICD-10-CM

## 2022-10-04 DIAGNOSIS — Z131 Encounter for screening for diabetes mellitus: Secondary | ICD-10-CM | POA: Diagnosis not present

## 2022-10-04 LAB — CBC WITH DIFFERENTIAL/PLATELET
Basophils Absolute: 0 10*3/uL (ref 0.0–0.1)
Basophils Relative: 0.1 % (ref 0.0–3.0)
Eosinophils Absolute: 0.3 10*3/uL (ref 0.0–0.7)
Eosinophils Relative: 1 % (ref 0.0–5.0)
HCT: 37.5 % (ref 36.0–46.0)
Hemoglobin: 12.3 g/dL (ref 12.0–15.0)
Lymphocytes Relative: 85 % — ABNORMAL HIGH (ref 12.0–46.0)
Lymphs Abs: 24.9 10*3/uL — ABNORMAL HIGH (ref 0.7–4.0)
MCHC: 32.9 g/dL (ref 30.0–36.0)
MCV: 101.5 fl — ABNORMAL HIGH (ref 78.0–100.0)
Monocytes Absolute: 0.6 10*3/uL (ref 0.1–1.0)
Monocytes Relative: 2.2 % — ABNORMAL LOW (ref 3.0–12.0)
Neutro Abs: 3.4 10*3/uL (ref 1.4–7.7)
Neutrophils Relative %: 11.7 % — ABNORMAL LOW (ref 43.0–77.0)
Platelets: 193 10*3/uL (ref 150.0–400.0)
RBC: 3.69 Mil/uL — ABNORMAL LOW (ref 3.87–5.11)
RDW: 12.8 % (ref 11.5–15.5)
WBC: 29.3 10*3/uL (ref 4.0–10.5)

## 2022-10-04 LAB — COMPREHENSIVE METABOLIC PANEL
ALT: 18 U/L (ref 0–35)
AST: 20 U/L (ref 0–37)
Albumin: 4.1 g/dL (ref 3.5–5.2)
Alkaline Phosphatase: 66 U/L (ref 39–117)
BUN: 17 mg/dL (ref 6–23)
CO2: 28 mEq/L (ref 19–32)
Calcium: 10.3 mg/dL (ref 8.4–10.5)
Chloride: 97 mEq/L (ref 96–112)
Creatinine, Ser: 0.9 mg/dL (ref 0.40–1.20)
GFR: 57.44 mL/min — ABNORMAL LOW (ref >60.00)
Glucose, Bld: 88 mg/dL (ref 70–99)
Potassium: 4.9 mEq/L (ref 3.5–5.1)
Sodium: 133 mEq/L — ABNORMAL LOW (ref 135–145)
Total Bilirubin: 0.7 mg/dL (ref 0.2–1.2)
Total Protein: 6.1 g/dL (ref 6.0–8.3)

## 2022-10-04 LAB — TSH: TSH: 3.44 u[IU]/mL (ref 0.35–5.50)

## 2022-10-04 LAB — LDL CHOLESTEROL, DIRECT: Direct LDL: 125 mg/dL

## 2022-10-04 LAB — VITAMIN B12: Vitamin B-12: >1500 pg/mL — ABNORMAL HIGH (ref 211–911)

## 2022-10-04 LAB — HEMOGLOBIN A1C: Hgb A1c MFr Bld: 5.9 % (ref 4.6–6.5)

## 2022-10-04 NOTE — Telephone Encounter (Signed)
Patient has known CLL and is followed by oncology-reasonably stable with recent results-I will review CBC later

## 2022-10-04 NOTE — Telephone Encounter (Signed)
Clydie Braun for called COL w/ WBC  29.3 , send message to Dr. Durene Cal

## 2022-10-04 NOTE — Patient Instructions (Addendum)
blood pressure high in office last 2 readings- she prefers not to increase medicine unless absolutely has to- feels higher in medical offices- we will confirm this with home testing- she agrees to check blood pressure at home at least 3-4 days a week and update me in 2-3 weeks. Goal <135/85  Please stop by lab before you go If you have mychart- we will send your results within 3 business days of [place] receiving them.  If you do not have mychart- we will call you about results within 5 business days of [place] receiving them.  *please also note that you will see labs on mychart as soon as they post. I will later go in and write notes on them- will say "notes from Dr. Durene Cal"     Recommended follow up: Return in about 6 months (around 04/06/2023) for followup or sooner if needed.Schedule b4 you leave.

## 2022-10-04 NOTE — Progress Notes (Signed)
Phone [phone] In person visit   Subjective:   Rebecca [name] is a 89 y.o. year old very pleasant female patient who presents for/with See problem oriented charting Chief Complaint  Patient presents with   Medical Management of Chronic Issues   Hyperlipidemia   Hypertension   spot on lower leg    Pt has a spot on lower right leg that she wants you to look at   Past Medical History-  Patient Active Problem List   Diagnosis Date Noted   CLL (chronic lymphocytic leukemia) (HCC) 09/30/2021    Priority: High   Carotid artery disease (HCC) 07/28/2008    Priority: High   Myalgia 04/05/2022    Priority: Medium    Chronic hyponatremia 03/25/2021    Priority: Medium    Vitamin D deficiency 12/25/2013    Priority: Medium    SPINAL STENOSIS, LUMBAR 03/09/2010    Priority: Medium    Osteoporosis 03/09/2010    Priority: Medium    MIGRAINE, OPHTHALMIC 10/29/2009    Priority: Medium    Hypothyroidism 11/15/2006    Priority: Medium    Hyperlipidemia 11/07/2006    Priority: Medium    Essential hypertension 10/18/2006    Priority: Medium    GERD 10/18/2006    Priority: Medium    Cholangitis 03/26/2021    Priority: Low   Cholelithiasis 03/25/2021    Priority: Low   Elevated LFTs 03/25/2021    Priority: Low   Bilateral primary osteoarthritis of knee 06/11/2019    Priority: Low   Pain in left shoulder 05/22/2019    Priority: Low   ADENOMATOUS COLONIC POLYP 06/10/2009    Priority: Low   History of iron deficiency 05/06/2009    Priority: Low   Allergic rhinitis 03/20/2008    Priority: Low   DYSPHAGIA 03/12/2008    Priority: Low   Ganglion of tendon sheath 08/17/2007    Priority: Low   DIVERTICULOSIS, COLON 10/18/2006    Priority: Low   B12 deficiency 10/04/2022    Medications- reviewed and updated Current Outpatient Medications  Medication Sig Dispense Refill   acetaminophen (TYLENOL) 500 MG tablet Take 500-1,000 mg by mouth every 6 (six) hours as needed for mild  pain or headache.     atorvastatin (LIPITOR) 10 MG tablet Take 1 tablet (10 mg total) by mouth 2 (two) times a week. 26 tablet 3   Calcium Carb-Cholecalciferol (CALCIUM-VITAMIN D) 600-400 MG-UNIT TABS Take 1 tablet by mouth daily.     Cholecalciferol (VITAMIN D3) 2000 UNITS TABS Take 1 tablet by mouth daily.     diclofenac sodium (VOLTAREN) 1 % GEL Apply 2 g topically 4 (four) times daily. (Patient taking differently: Apply 2 g topically 4 (four) times daily as needed (for pain).) 1 Tube 1   esomeprazole (NEXIUM) 20 MG capsule Take 20 mg by mouth 2 (two) times daily before a meal.     estradiol (ESTRACE) 0.1 MG/GM vaginal cream Place 0.5 g vaginally See admin instructions. Place 0.5 g vaginally two to three times a week     levothyroxine (SYNTHROID) 88 MCG tablet Take 1 tablet (88 mcg total) by mouth daily before breakfast. 90 tablet 3   loperamide (IMODIUM) 2 MG capsule Take 1 capsule (2 mg total) by mouth 4 (four) times daily as needed for diarrhea or loose stools. 12 capsule 0   losartan (COZAAR) 100 MG tablet Take 1 tablet (100 mg total) by mouth daily. 90 tablet 3   metoCLOPramide (REGLAN) 5 MG tablet TAKE 1 TABLET  BY MOUTH TWICE DAILY WITH MEALS (Patient taking differently: Take 5 mg by mouth 2 (two) times daily with a meal.) 60 tablet 3   nystatin-triamcinolone (MYCOLOG II) cream Apply 1 Application topically See admin instructions. Apply to the affected area 2 times a week as needed for irritation     timolol (TIMOPTIC) 0.5 % ophthalmic solution Place 1 drop into both eyes 2 (two) times daily.     vitamin B-12 (CYANOCOBALAMIN) 500 MCG tablet Take 500 mcg by mouth every morning.     vitamin C (ASCORBIC ACID) 500 MG tablet Take 500 mg by mouth daily.     VYZULTA 0.024 % SOLN Place 1 drop into both eyes at bedtime.     ALPRAZolam (XANAX) 0.25 MG tablet TAKE 1 TABLET BY MOUTH AT BEDTIME AS NEEDED. DO NOT DRIVE FOR 8 HOURS AFTER TAKING AND STOP IF ANY FALLS UNTIL NEXT DOCTOR VISIT (Patient not  taking: Reported on 08/26/2022) 20 tablet 0   cyclobenzaprine (FLEXERIL) 10 MG tablet Take 1 tablet (10 mg total) by mouth at bedtime as needed for muscle spasms. (Patient not taking: Reported on 10/04/2022) 30 tablet 0   ondansetron (ZOFRAN-ODT) 4 MG disintegrating tablet Take 1 tablet (4 mg total) by mouth every 8 (eight) hours as needed for nausea or vomiting. (Patient not taking: Reported on 10/04/2022) 20 tablet 0   No current facility-administered medications for this visit.     Objective:  BP (!) 152/76   Pulse (!) 58   Temp (!) 97.2 F (36.2 C)   Ht 5\' 5"  (1.651 m)   Wt 118 lb (53.5 kg)   SpO2 98%   BMI 19.64 kg/m  Gen: NAD, resting comfortably CV: RRR no murmurs rubs or gallops Lungs: CTAB no crackles, wheeze, rhonchi Ext: no edema Skin: warm, dry Neuro: walks with cane    Assessment and Plan    # Skin lesion S: Patient noted this on right lower leg starting months ago after removal for skin cancer then had to have another removal with chemo injections- finally 1.5 weeks ago had scraping which was clear for no cancer with Dr. Yetta Barre . Central white discoloration without obvious purulent drainage A/P: she is going to continue to monitor as slowly improving should- get smaller and smaller overtime if not then see Dr. Yetta Barre back     # Spinal stenosis-patient required prednisone course for this at 09/28/2022 visit with Dr. Mardelle Matte. Was getting spasms and muscle relaxant helped as well- much better today   # Bilateral knee arthritis-Orthovisc injections tend to help-most recently 08/24/2022 with [facility]- thinks this was the last one  % CLL- follows with Dr. Jeanmarie Plant last visit in march 2024 another visit in september  #hypertension S: medication: losartan 100 mg- took last night Home readings #s: doesn't check   BP Readings from Last 3 Encounters:  10/04/22 (!) 152/76  06/14/22 (!) 158/68  04/05/22 138/70   A/P: blood pressure high in office last 2 readings- she prefers  not to increase medicine unless absolutely has to- feels higher in medical offices- we will confirm this with home testing- she agrees to check blood pressure at home at least 3-4 days a week and update me in 2-3 weeks. Goal <135/85   #hyperlipidemia # Carotid stenosis 1 to 39% on the right and left ICA, ECA greater than 50% in 2022 S: Medication:atorvastatin 10 mg- twice weekly start 06/15/22 - no similar symptoms to rosuvastatin -rosuvastatin 20 mg prescribed- had a jell/weakness sensation in calves  when she took it and opted to stop -weakness on lipitor in past  Lab Results  Component Value Date   CHOL 267 (H) 04/05/2022   HDL 47.70 04/05/2022   LDLCALC 186 (H) 04/05/2022   LDLDIRECT 89.0 05/13/2020   TRIG 165.0 (H) 04/05/2022   CHOLHDL 6 04/05/2022   A/P: hopefully improving- update LDL - could consider 3x a week but want to adjust slowly due to prior weakness issues with statins Carotid stenosis- likely stable on statin but do want to get Low Density Lipoprotein (LDL cholesterol) under 70 if possible    #hypothyroidism S: compliant On thyroid medication-levothyroxine 88 mcg Lab Results  Component Value Date   TSH 0.89 04/05/2022  A/P:hopefully stable- update  today. Continue current meds for now    # Osteoporosis #Vitamin D deficiency S: Last DEXA:  with GYN and has planned repeat- reports slightly overdue but will call to schedule Medication (bisphosphonate or prolia): 5 years fosamax through GYN in past  Calcium: 1200mg  (through diet ok) recommended - taking Vitamin D: At least 1000 units a day recommended-takes 2000 units Last vitamin D Lab Results  Component Value Date   VD25OH 42.96 04/05/2022  A/P: osteoporosis hopefully stable- she agrees to call to schedule bone density with GYN    # B12 deficiency S: Current treatment/medication (oral vs. IM): Weekly injections for 1 month required in January  2024- but she opted for pill- still taking 1000 mcg daily A/P:  hopefully improved- update b12 today. Continue current meds for now    # Hyperglycemia/insulin resistance/prediabetes- was told sugar high years ago S:  Medication: none Exercise and diet- feeels could cut down on cookies Lab Results  Component Value Date   HGBA1C 5.6 07/26/2006   A/P: reports sugar high years ago- will update a1c with labs  Recommended follow up: Return in about 6 months (around 04/06/2023) for followup or sooner if needed.Schedule b4 you leave. Future Appointments  Date Time Provider Department Center  12/13/2022 12:30 PM [facility] LAB [facility] None  12/13/2022  1:00 PM Johney Maine, MD [facility] None  01/05/2023 11:00 AM [facility] ANNUAL WELLNESS VISIT 1 [facility] P[facility]    Lab/Order associations:   ICD-10-CM   1. CLL (chronic lymphocytic leukemia) (HCC)  C91.10     2. Hypothyroidism, unspecified type  E03.9 TSH    3. B12 deficiency  E53.8 Vitamin B12    4. Hyperlipidemia, unspecified hyperlipidemia type  E78.5 Comprehensive metabolic panel    CBC with Differential/Platelet    LDL cholesterol, direct    5. Essential hypertension  I10     6. Hyperglycemia  R73.9 Hemoglobin A1c    7. Screening for diabetes mellitus  Z13.1 Hemoglobin A1c      No orders of the defined types were placed in this encounter.   Return precautions advised.  Tana Conch, MD

## 2022-10-05 ENCOUNTER — Other Ambulatory Visit: Payer: Self-pay

## 2022-10-05 ENCOUNTER — Encounter: Payer: Self-pay | Admitting: Family Medicine

## 2022-10-05 MED ORDER — ATORVASTATIN CALCIUM 10 MG PO TABS: 10.0000 mg | ORAL_TABLET | ORAL | 3 refills | Status: DC

## 2022-10-11 ENCOUNTER — Other Ambulatory Visit (INDEPENDENT_AMBULATORY_CARE_PROVIDER_SITE_OTHER): Payer: Medicare Other

## 2022-10-11 ENCOUNTER — Encounter: Payer: Self-pay | Admitting: Physician Assistant

## 2022-10-11 ENCOUNTER — Ambulatory Visit (INDEPENDENT_AMBULATORY_CARE_PROVIDER_SITE_OTHER): Payer: Medicare Other | Admitting: Physician Assistant

## 2022-10-11 DIAGNOSIS — M25552 Pain in left hip: Secondary | ICD-10-CM | POA: Diagnosis not present

## 2022-10-11 NOTE — Progress Notes (Addendum)
Office Visit Note   Patient: Rebecca [name]           Date of Birth: Nov 01, 1932           MRN: [id number] Visit Date: 10/11/2022              Requested by: Shelva Majestic, MD [address] [place],  [place] [address] PCP: Shelva Majestic, MD  Chief Complaint  Patient presents with   Left Hip - Follow-up, Pain      HPI: Patient is a pleasant 89 year old woman who is a former patient of Dr. Cleophas Dunker.  She comes in today with a chief complaint of left lateral hip pain.  Pain is on and off.  She does have a history of trochanteric bursitis on this side and is also had from what she describes a nonoperative fracture years ago.  Denies any fever or chills no history of fall or recent injury  Assessment & Plan: Visit Diagnoses:  1. Pain in left hip     Plan: I reviewed the x-rays with Dr. Shon Baton when he arrived to clinic.  She does have some erosive change over the greater trochanter of the right  hip that was not seen on previous x-rays that showed some cystic changes.  Will discuss with her doing a CT scan to rule out any erosive process.  In the meantime would like her to use Voltaren gel  Follow-Up Instructions: After CT  Ortho Exam  Patient is alert, oriented, no adenopathy, well-dressed, normal affect, normal respiratory effort. Examination of her hip no redness no erythema she has no pain with internal/external rotation of her hip no groin pain she is neurovascularly intact.  She is focally tender over the greater trochanter.  There is no radiation of symptoms.  She ambulates with a cane which is baseline for her.  Imaging: No results found. No images are attached to the encounter.  Labs: Lab Results  Component Value Date   HGBA1C 5.9 10/04/2022   HGBA1C 5.6 07/26/2006   ESRSEDRATE 22 02/10/2021   REPTSTATUS 03/17/2022 FINAL 03/16/2022   CULT (A) 03/16/2022    <10,000 COLONIES/mL INSIGNIFICANT GROWTH Performed at [facility], [address],  [place], [place] [address]    LABORGA NO GROWTH 06/10/2016     Lab Results  Component Value Date   ALBUMIN 4.1 10/04/2022   ALBUMIN 4.2 06/14/2022   ALBUMIN 4.1 04/05/2022    Lab Results  Component Value Date   MG 1.8 03/28/2021   Lab Results  Component Value Date   VD25OH 42.96 04/05/2022   VD25OH 45 03/20/2020   VD25OH 47.49 01/01/2016    No results found for: "PREALBUMIN"    Latest Ref Rng & Units 10/04/2022    9:33 AM 06/14/2022   12:40 PM 04/05/2022    1:59 PM  CBC EXTENDED  WBC 4.0 - 10.5 K/uL 29.3 Repeated and verified X2.  24.2  32.3 Repeated and verified X2.   RBC 3.87 - 5.11 Mil/uL 3.69  3.64  3.52   Hemoglobin 12.0 - 15.0 g/dL 82.9  56.2  13.0   HCT 36.0 - 46.0 % 37.5  36.6  36.3   Platelets 150.0 - 400.0 K/uL 193.0  190  214.0   NEUT# 1.4 - 7.7 K/uL 3.4  3.8  3.3   Lymph# 0.7 - 4.0 K/uL 24.9  16.4  28.0      There is no height or weight on file to calculate BMI.  Orders:  Orders Placed This Encounter  Procedures   XR Pelvis 1-2 Views   No orders of the defined types were placed in this encounter.    Procedures: No procedures performed  Clinical Data: No additional findings.  ROS:  All other systems negative, except as noted in the HPI. Review of Systems  Objective: Vital Signs: There were no vitals taken for this visit.  Specialty Comments:  No specialty comments available.  PMFS History: Patient Active Problem List   Diagnosis Date Noted   B12 deficiency 10/04/2022   Myalgia 04/05/2022   CLL (chronic lymphocytic leukemia) (HCC) 09/30/2021   Cholangitis 03/26/2021   Cholelithiasis 03/25/2021   Elevated LFTs 03/25/2021   Chronic hyponatremia 03/25/2021   Bilateral primary osteoarthritis of knee 06/11/2019   Pain in left shoulder 05/22/2019   Vitamin D deficiency 12/25/2013   SPINAL STENOSIS, LUMBAR 03/09/2010   Osteoporosis 03/09/2010   MIGRAINE, OPHTHALMIC 10/29/2009   ADENOMATOUS COLONIC POLYP 06/10/2009   History of iron  deficiency 05/06/2009   Carotid artery disease (HCC) 07/28/2008   Allergic rhinitis 03/20/2008   DYSPHAGIA 03/12/2008   Ganglion of tendon sheath 08/17/2007   Hypothyroidism 11/15/2006   Hyperlipidemia 11/07/2006   Essential hypertension 10/18/2006   GERD 10/18/2006   DIVERTICULOSIS, COLON 10/18/2006   Past Medical History:  Diagnosis Date   Anemia    Anxiety    Bifascicular block    Diverticulitis    Diverticulosis    Gastroparesis    GERD (gastroesophageal reflux disease)    Glaucoma    Hemorrhoids    Hyperlipidemia    Hypertension    Hypertension    IBS (irritable bowel syndrome)    Rectal ulcer    SCOLIOSIS, LUMBAR SPINE 03/09/2010   Qualifier: Diagnosis of  By: Lovell Sheehan MD, John E    Segmental colitis (HCC)    Tubular adenoma of colon 05/2009    Family History  Problem Relation Age of Onset   Diabetes Mother    Heart disease Mother    Hyperlipidemia Mother    Hypertension Mother    Anuerysm Father        abdominal- heavy smoker   Heart disease Father    Hypertension Father    Colon polyps Sister    Lung cancer Sister        smoker   Colon polyps Brother    Diabetes Brother    Hyperlipidemia Brother    Hypertension Brother    Heart disease Brother    Colon cancer Paternal Grandmother        with possible stomach cancer   Migraines Daughter    Kidney Stones Daughter    Colon polyps Daughter    Breast cancer Daughter    Cancer Daughter        salivary   Colon cancer Maternal Aunt        Rectal cancer   Crohn's disease Neg Hx    Pancreatic cancer Neg Hx     Past Surgical History:  Procedure Laterality Date   APPENDECTOMY     ARCUATE KERATECTOMY     BILIARY DILATION  03/27/2021   Procedure: BILIARY DILATION;  Surgeon: Rachael Fee, MD;  Location: [facility];  Service: Gastroenterology;;   CATARACT EXTRACTION, BILATERAL  2015   CHOLECYSTECTOMY N/A 03/28/2021   Procedure: LAPAROSCOPIC CHOLECYSTECTOMY;  Surgeon: Romie Levee, MD;  Location: [facility];  Service: General;  Laterality: N/A;   double pallital tori bone removed      ERCP N/A 03/27/2021   Procedure: ENDOSCOPIC  RETROGRADE CHOLANGIOPANCREATOGRAPHY (ERCP);  Surgeon: Rachael Fee, MD;  Location: [facility];  Service: Gastroenterology;  Laterality: N/A;   HERNIA REPAIR     umbilical   KNEE SURGERY Bilateral    morton's neuroma on rt foot and platar facial release     MOUTH SURGERY     "bone shaved off roof of mouth"   REMOVAL OF STONES  03/27/2021   Procedure: REMOVAL OF STONES;  Surgeon: Rachael Fee, MD;  Location: [facility];  Service: Gastroenterology;;   SALIVARY GLAND SURGERY     removal   SPHINCTEROTOMY  03/27/2021   Procedure: SPHINCTEROTOMY;  Surgeon: Rachael Fee, MD;  Location: [facility];  Service: Gastroenterology;;   TONSILLECTOMY     Social History   Occupational History   Occupation: Print production planner    Comment: retired  Tobacco Use   Smoking status: Never   Smokeless tobacco: Never  Vaping Use   Vaping Use: Never used  Substance and Sexual Activity   Alcohol use: Yes    Alcohol/week: 0.0 standard drinks of alcohol    Comment: rare   Drug use: No   Sexual activity: Not Currently

## 2022-10-12 ENCOUNTER — Telehealth: Payer: Self-pay | Admitting: Physician Assistant

## 2022-10-13 ENCOUNTER — Encounter: Payer: Self-pay | Admitting: Physician Assistant

## 2022-10-13 ENCOUNTER — Ambulatory Visit (INDEPENDENT_AMBULATORY_CARE_PROVIDER_SITE_OTHER): Payer: Medicare Other | Admitting: Physician Assistant

## 2022-10-13 ENCOUNTER — Other Ambulatory Visit (INDEPENDENT_AMBULATORY_CARE_PROVIDER_SITE_OTHER): Payer: Medicare Other

## 2022-10-13 DIAGNOSIS — M25551 Pain in right hip: Secondary | ICD-10-CM | POA: Diagnosis not present

## 2022-10-13 DIAGNOSIS — M25552 Pain in left hip: Secondary | ICD-10-CM

## 2022-10-13 NOTE — Progress Notes (Signed)
Office Visit Note   Patient: Rebecca [name]           Date of Birth: 02-Apr-1933           MRN: [id number] Visit Date: 10/13/2022              Requested by: Rebecca Majestic, MD [address] [place],  [place] [address] PCP: Rebecca Majestic, MD  Chief Complaint  Patient presents with   Right Hip - Pain   Left Hip - Pain      HPI: Rebecca [name] comes in today accompanied by her daughter.  I saw her a few days ago and she had lateral left hip pain.  She called me yesterday and has an acute onset of increasing pain all over the lateral hip.  Nothing in the groin.  She has not had a fall or any other injury.  She has had a history of trochanteric bursitis and pain in this hip and was followed by Rebecca [name].  3 years ago she also had a CT of the hip which questioned a nondisplaced fracture of the trochanter.  No trauma.  She also has had an MRI of the hip which demonstrated ossifications probably as a soliloquy of the chronic tendon inflammation.  Rated her pain as severe yesterday it has improved though she still using a crutch  Assessment & Plan: Visit Diagnoses:  1. Pain in left hip   2. Bilateral hip pain     Plan: I do think most of this is from the ectopic calcifications.  She has no groin pain and no pain with manipulation of the hip.  She does also have severe degenerative changes in her lower back however again I think this is without any radicular findings.  Because of her previous history of nondisplaced fracture I will get a CT of the hip.  I will have her follow-up with Rebecca [name] for further management of this problem.  She is in agreement with this plan as is her daughter  Follow-Up Instructions: Return With Rebecca [name].   Ortho Exam  Patient is alert, oriented, no adenopathy, well-dressed, normal affect, normal respiratory effort. Examination of her left hip.  And right hip no redness no erythema.  She has full fluid range of motion no tenderness in the groin she  does have some tenderness over the lateral greater trochanter.  She has good flexion and extension change strength she is neurovascularly intact  Imaging: XR HIPS BILAT W OR W/O PELVIS 2V  Result Date: 10/13/2022 Bilateral hips imaged today.  No evidence of any acute fracture or dislocation abnormalities in the right hip were positional.  Left hip does have ossifications within the tendons also seen in the x-rays 3 years ago  No images are attached to the encounter.  Labs: Lab Results  Component Value Date   HGBA1C 5.9 10/04/2022   HGBA1C 5.6 07/26/2006   ESRSEDRATE 22 02/10/2021   REPTSTATUS 03/17/2022 FINAL 03/16/2022   CULT (A) 03/16/2022    <10,000 COLONIES/mL INSIGNIFICANT GROWTH Performed at [facility], [address], [place], [place] [address]    LABORGA NO GROWTH 06/10/2016     Lab Results  Component Value Date   ALBUMIN 4.1 10/04/2022   ALBUMIN 4.2 06/14/2022   ALBUMIN 4.1 04/05/2022    Lab Results  Component Value Date   MG 1.8 03/28/2021   Lab Results  Component Value Date   VD25OH 42.96 04/05/2022   VD25OH 45 03/20/2020  VD25OH 47.49 01/01/2016    No results found for: "PREALBUMIN"    Latest Ref Rng & Units 10/04/2022    9:33 AM 06/14/2022   12:40 PM 04/05/2022    1:59 PM  CBC EXTENDED  WBC 4.0 - 10.5 K/uL 29.3 Repeated and verified X2.  24.2  32.3 Repeated and verified X2.   RBC 3.87 - 5.11 Mil/uL 3.69  3.64  3.52   Hemoglobin 12.0 - 15.0 g/dL 16.1  09.6  04.5   HCT 36.0 - 46.0 % 37.5  36.6  36.3   Platelets 150.0 - 400.0 K/uL 193.0  190  214.0   NEUT# 1.4 - 7.7 K/uL 3.4  3.8  3.3   Lymph# 0.7 - 4.0 K/uL 24.9  16.4  28.0      There is no height or weight on file to calculate BMI.  Orders:  Orders Placed This Encounter  Procedures   XR HIPS BILAT W OR W/O PELVIS 2V   CT HIP LEFT WO CONTRAST   No orders of the defined types were placed in this encounter.    Procedures: No procedures performed  Clinical Data: No additional  findings.  ROS:  All other systems negative, except as noted in the HPI. Review of Systems  Objective: Vital Signs: There were no vitals taken for this visit.  Specialty Comments:  No specialty comments available.  PMFS History: Patient Active Problem List   Diagnosis Date Noted   B12 deficiency 10/04/2022   Myalgia 04/05/2022   CLL (chronic lymphocytic leukemia) (HCC) 09/30/2021   Cholangitis 03/26/2021   Cholelithiasis 03/25/2021   Elevated LFTs 03/25/2021   Chronic hyponatremia 03/25/2021   Bilateral primary osteoarthritis of knee 06/11/2019   Pain in left shoulder 05/22/2019   Vitamin D deficiency 12/25/2013   SPINAL STENOSIS, LUMBAR 03/09/2010   Osteoporosis 03/09/2010   MIGRAINE, OPHTHALMIC 10/29/2009   ADENOMATOUS COLONIC POLYP 06/10/2009   History of iron deficiency 05/06/2009   Carotid artery disease (HCC) 07/28/2008   Allergic rhinitis 03/20/2008   DYSPHAGIA 03/12/2008   Ganglion of tendon sheath 08/17/2007   Hypothyroidism 11/15/2006   Hyperlipidemia 11/07/2006   Essential hypertension 10/18/2006   GERD 10/18/2006   DIVERTICULOSIS, COLON 10/18/2006   Past Medical History:  Diagnosis Date   Anemia    Anxiety    Bifascicular block    Diverticulitis    Diverticulosis    Gastroparesis    GERD (gastroesophageal reflux disease)    Glaucoma    Hemorrhoids    Hyperlipidemia    Hypertension    Hypertension    IBS (irritable bowel syndrome)    Rectal ulcer    SCOLIOSIS, LUMBAR SPINE 03/09/2010   Qualifier: Diagnosis of  By: Rebecca Sheehan MD, Rebecca [name]    Segmental colitis (HCC)    Tubular adenoma of colon 05/2009    Family History  Problem Relation Age of Onset   Diabetes Mother    Heart disease Mother    Hyperlipidemia Mother    Hypertension Mother    Anuerysm Father        abdominal- heavy smoker   Heart disease Father    Hypertension Father    Colon polyps Sister    Lung cancer Sister        smoker   Colon polyps Brother    Diabetes Brother     Hyperlipidemia Brother    Hypertension Brother    Heart disease Brother    Colon cancer Paternal Grandmother        with possible stomach cancer  Migraines Daughter    Kidney Stones Daughter    Colon polyps Daughter    Breast cancer Daughter    Cancer Daughter        salivary   Colon cancer Maternal Aunt        Rectal cancer   Crohn's disease Neg Hx    Pancreatic cancer Neg Hx     Past Surgical History:  Procedure Laterality Date   APPENDECTOMY     ARCUATE KERATECTOMY     BILIARY DILATION  03/27/2021   Procedure: BILIARY DILATION;  Surgeon: Rachael Fee, MD;  Location: [facility];  Service: Gastroenterology;;   CATARACT EXTRACTION, BILATERAL  2015   CHOLECYSTECTOMY N/A 03/28/2021   Procedure: LAPAROSCOPIC CHOLECYSTECTOMY;  Surgeon: Romie Levee, MD;  Location: [facility];  Service: General;  Laterality: N/A;   double pallital tori bone removed      ERCP N/A 03/27/2021   Procedure: ENDOSCOPIC RETROGRADE CHOLANGIOPANCREATOGRAPHY (ERCP);  Surgeon: Rachael Fee, MD;  Location: [facility];  Service: Gastroenterology;  Laterality: N/A;   HERNIA REPAIR     umbilical   KNEE SURGERY Bilateral    morton's neuroma on rt foot and platar facial release     MOUTH SURGERY     "bone shaved off roof of mouth"   REMOVAL OF STONES  03/27/2021   Procedure: REMOVAL OF STONES;  Surgeon: Rachael Fee, MD;  Location: [facility];  Service: Gastroenterology;;   SALIVARY GLAND SURGERY     removal   SPHINCTEROTOMY  03/27/2021   Procedure: SPHINCTEROTOMY;  Surgeon: Rachael Fee, MD;  Location: [facility];  Service: Gastroenterology;;   TONSILLECTOMY     Social History   Occupational History   Occupation: Print production planner    Comment: retired  Tobacco Use   Smoking status: Never   Smokeless tobacco: Never  Vaping Use   Vaping status: Never Used  Substance and Sexual Activity   Alcohol use: Yes    Alcohol/week: 0.0 standard drinks of alcohol    Comment: rare   Drug use:  No   Sexual activity: Not Currently

## 2022-10-14 ENCOUNTER — Other Ambulatory Visit: Payer: Self-pay | Admitting: Physician Assistant

## 2022-10-17 ENCOUNTER — Ambulatory Visit: Payer: Medicare Other | Admitting: Podiatry

## 2022-10-17 ENCOUNTER — Other Ambulatory Visit: Payer: Self-pay | Admitting: Family Medicine

## 2022-10-17 ENCOUNTER — Encounter: Payer: Self-pay | Admitting: Podiatry

## 2022-10-17 DIAGNOSIS — D2371 Other benign neoplasm of skin of right lower limb, including hip: Secondary | ICD-10-CM | POA: Diagnosis not present

## 2022-10-17 NOTE — Progress Notes (Signed)
Chief Complaint  Patient presents with   Callouses    Trim calluses - bilateral hallux (R>L)     Subjective: 89 y.o. female presenting to the office today for follow-up evaluation of a symptomatic skin lesion to the plantar aspect of the right first MTP.  She was last seen in the office about 1 year ago and slowly the pain is returned.  She presents for follow-up treatment and evaluation   Past Medical History:  Diagnosis Date   Anemia    Anxiety    Bifascicular block    Diverticulitis    Diverticulosis    Gastroparesis    GERD (gastroesophageal reflux disease)    Glaucoma    Hemorrhoids    Hyperlipidemia    Hypertension    Hypertension    IBS (irritable bowel syndrome)    Rectal ulcer    SCOLIOSIS, LUMBAR SPINE 03/09/2010   Qualifier: Diagnosis of  By: Lovell Sheehan MD, John E    Segmental colitis [facility])    Tubular adenoma of colon 05/2009    Past Surgical History:  Procedure Laterality Date   APPENDECTOMY     ARCUATE KERATECTOMY     BILIARY DILATION  03/27/2021   Procedure: BILIARY DILATION;  Surgeon: Rachael Fee, MD;  Location: [facility];  Service: Gastroenterology;;   CATARACT EXTRACTION, BILATERAL  2015   CHOLECYSTECTOMY N/A 03/28/2021   Procedure: LAPAROSCOPIC CHOLECYSTECTOMY;  Surgeon: Romie Levee, MD;  Location: [facility];  Service: General;  Laterality: N/A;   double pallital tori bone removed      ERCP N/A 03/27/2021   Procedure: ENDOSCOPIC RETROGRADE CHOLANGIOPANCREATOGRAPHY (ERCP);  Surgeon: Rachael Fee, MD;  Location: [facility];  Service: Gastroenterology;  Laterality: N/A;   HERNIA REPAIR     umbilical   KNEE SURGERY Bilateral    morton's neuroma on rt foot and platar facial release     MOUTH SURGERY     "bone shaved off roof of mouth"   REMOVAL OF STONES  03/27/2021   Procedure: REMOVAL OF STONES;  Surgeon: Rachael Fee, MD;  Location: [facility];  Service: Gastroenterology;;   SALIVARY GLAND SURGERY     removal   SPHINCTEROTOMY   03/27/2021   Procedure: SPHINCTEROTOMY;  Surgeon: Rachael Fee, MD;  Location: [facility];  Service: Gastroenterology;;   TONSILLECTOMY      Allergies  Allergen Reactions   Levaquin [Levofloxacin] Other (See Comments)    Very nauseous and shakey   Sulfamethoxazole-Trimethoprim Nausea Only and Other (See Comments)    Shakiness, also   Lactose Intolerance (Gi) Diarrhea and Other (See Comments)    Makes the "stomach hurt"   Tramadol Hcl Nausea Only and Palpitations   Augmentin [Amoxicillin-Pot Clavulanate] Diarrhea   Ciprofloxacin Nausea Only and Other (See Comments)    Thrush and Shakiness also   Rosuvastatin Other (See Comments)    Caused restless legs   Zosyn [Piperacillin Sod-Tazobactam So] Diarrhea and Other (See Comments)    Severe diarrhea   Adhesive [Tape] Rash   Codeine Nausea Only   Pneumococcal Vaccine Polyvalent Rash and Other (See Comments)    Caused a RED AND RAISED RASH ON ARM     Objective:  Physical Exam General: Alert and oriented x3 in no acute distress  Dermatology: Hyperkeratotic lesion(s) present on the plantar aspect of the right great toe joint. Pain on palpation with a central nucleated core noted. Skin is warm, dry and supple bilateral lower extremities. Negative for open lesions or macerations.  Vascular: Palpable pedal  pulses bilaterally. No edema or erythema noted. Capillary refill within normal limits.  Neurological: Grossly intact via light touch  Musculoskeletal Exam: Pain on palpation at the keratotic lesion(s) noted. Range of motion within normal limits bilateral. Muscle strength 5/5 in all groups bilateral.  Assessment: 1.  Symptomatic benign skin lesion   Plan of Care:  -Patient evaluated -Excisional debridement of keratoic lesion(s) using a chisel blade was performed without incident.  -Salicylic acid applied with a bandaid -Return to the clinic PRN.   Felecia Shelling, DPM [facility]  Dr. Felecia Shelling, DPM     [address] [place], [place] [address]                Office [phone]  Fax [phone]

## 2022-10-20 ENCOUNTER — Other Ambulatory Visit: Payer: Medicare Other

## 2022-10-21 ENCOUNTER — Ambulatory Visit
Admission: RE | Admit: 2022-10-21 | Discharge: 2022-10-21 | Disposition: A | Discharge status: Home / Self Care | Payer: Medicare Other | Source: Ambulatory Visit | Attending: Physician Assistant | Admitting: Physician Assistant

## 2022-10-21 DIAGNOSIS — M169 Osteoarthritis of hip, unspecified: Secondary | ICD-10-CM | POA: Diagnosis not present

## 2022-10-21 DIAGNOSIS — M25552 Pain in left hip: Secondary | ICD-10-CM

## 2022-10-24 ENCOUNTER — Telehealth: Payer: Self-pay | Admitting: Physician Assistant

## 2022-10-24 NOTE — Telephone Encounter (Signed)
Patient called about her CT scan and wanted to go over the results.CB#[phone]

## 2022-10-27 DIAGNOSIS — H903 Sensorineural hearing loss, bilateral: Secondary | ICD-10-CM | POA: Diagnosis not present

## 2022-11-02 ENCOUNTER — Encounter: Payer: Self-pay | Admitting: Orthopaedic Surgery

## 2022-11-02 ENCOUNTER — Ambulatory Visit (INDEPENDENT_AMBULATORY_CARE_PROVIDER_SITE_OTHER): Payer: Medicare Other | Admitting: Orthopaedic Surgery

## 2022-11-02 DIAGNOSIS — M25552 Pain in left hip: Secondary | ICD-10-CM

## 2022-11-02 DIAGNOSIS — M7062 Trochanteric bursitis, left hip: Secondary | ICD-10-CM

## 2022-11-02 MED ORDER — LIDOCAINE HCL 1 % IJ SOLN
3.0000 mL | INTRAMUSCULAR | Status: AC | PRN
  Administered 2022-11-02: 3 mL

## 2022-11-02 MED ORDER — METHYLPREDNISOLONE ACETATE 40 MG/ML IJ SUSP
40.0000 mg | INTRAMUSCULAR | Status: AC | PRN
  Administered 2022-11-02: 40 mg via INTRA_ARTICULAR

## 2022-11-02 NOTE — Progress Notes (Signed)
The patient is an 89 year old female that I am seeing for the first time but she has been a long-term patient of the office and Dr. Cleophas Dunker.  She has had injections of her left hip trochanteric area in the past but it has been a long amount of time.  Recently she had such severe hip pain that she was sent for a CT scan of that hip to rule out any type of fracture.  She points to the trochanteric area and just behind that of the ischium as a source of her pain.  She is feeling better.  She does state that she would like to try an injection today.  She has been ambulating with a cane try and offload that hip.  On my exam her left hip moves smoothly and fluidly with no pain in the groin and no blocks to rotation.  Her left hip does have pain over the trochanteric area and the ischium on exam.  The CT scan is reviewed and it did not show any type of fracture or malalignment.  There is minimal arthritic changes in the hip joint.  There is osteophytes around the trochanteric area that may be contributing to some of the pain that she is experiencing with likely tendinosis of the gluteus medius and minimus tendons and partial tearing as well as hip bursitis.  I did try to find the point of maximal tenderness around the trochanteric area and provided a steroid injection per her request around this area.  Hopefully this will get better for her.  Follow-up can be as needed unless it is not getting better.  We would then consider injections potentially a little bit lower down or even around the ischium.     Procedure Note  Patient: Rebecca [name]             Date of Birth: 04-19-1932           MRN: [id number]             Visit Date: 11/02/2022  Procedures: Visit Diagnoses:  1. Pain in left hip   2. Greater trochanteric bursitis of left hip     Large Joint Inj: L greater trochanter on 11/02/2022 1:23 PM Indications: pain and diagnostic evaluation Details: 22 G 1.5 in needle, lateral  approach  Arthrogram: No  Medications: 3 mL lidocaine 1 %; 40 mg methylPREDNISolone acetate 40 MG/ML Outcome: tolerated well, no immediate complications Procedure, treatment alternatives, risks and benefits explained, specific risks discussed. Consent was given by the patient. Immediately prior to procedure a time out was called to verify the correct patient, procedure, equipment, support staff and site/side marked as required. Patient was prepped and draped in the usual sterile fashion.

## 2022-11-03 ENCOUNTER — Other Ambulatory Visit: Payer: Medicare Other

## 2022-11-07 NOTE — Telephone Encounter (Signed)
-----   Message from Tana Conch sent at 11/03/2022  9:01 PM EDT ----- Can you check in with her about home blood pressure readings?  I do not believe we have heard back but I could be missing it

## 2022-11-08 DIAGNOSIS — L905 Scar conditions and fibrosis of skin: Secondary | ICD-10-CM | POA: Diagnosis not present

## 2022-11-08 DIAGNOSIS — Z85828 Personal history of other malignant neoplasm of skin: Secondary | ICD-10-CM | POA: Diagnosis not present

## 2022-11-14 ENCOUNTER — Other Ambulatory Visit: Payer: Self-pay | Admitting: Family Medicine

## 2022-11-14 ENCOUNTER — Telehealth: Payer: Self-pay | Admitting: Family Medicine

## 2022-11-14 DIAGNOSIS — E785 Hyperlipidemia, unspecified: Secondary | ICD-10-CM

## 2022-11-14 DIAGNOSIS — K2100 Gastro-esophageal reflux disease with esophagitis, without bleeding: Secondary | ICD-10-CM

## 2022-11-14 MED ORDER — METOCLOPRAMIDE HCL 5 MG PO TABS: ORAL_TABLET | ORAL | 3 refills | Status: DC

## 2022-11-14 NOTE — Telephone Encounter (Signed)
Refill sent to requested pharmacy.

## 2022-11-14 NOTE — Telephone Encounter (Signed)
Prescription Request  11/14/2022  LOV: 10/04/2022  What is the name of the medication or equipment? metoCLOPramide (REGLAN) 5 MG tablet   Have you contacted your pharmacy to request a refill? Yes   Which pharmacy would you like this sent to?  [id number] [place],  - [phone] AT [facility] & [address] [place] [address] Phone: [phone] Fax: [phone]   Patient notified that their request is being sent to the clinical staff for review and that they should receive a response within 2 business days.   Please advise at Mobile [phone] (mobile)

## 2022-11-16 DIAGNOSIS — R29890 Loss of height: Secondary | ICD-10-CM | POA: Diagnosis not present

## 2022-11-16 DIAGNOSIS — Z8262 Family history of osteoporosis: Secondary | ICD-10-CM | POA: Diagnosis not present

## 2022-11-16 DIAGNOSIS — M8588 Other specified disorders of bone density and structure, other site: Secondary | ICD-10-CM | POA: Diagnosis not present

## 2022-12-12 ENCOUNTER — Other Ambulatory Visit: Payer: Self-pay

## 2022-12-12 DIAGNOSIS — C9110 Chronic lymphocytic leukemia of B-cell type not having achieved remission: Secondary | ICD-10-CM

## 2022-12-13 ENCOUNTER — Inpatient Hospital Stay: Payer: Medicare Other | Attending: Hematology

## 2022-12-13 ENCOUNTER — Other Ambulatory Visit: Payer: Self-pay

## 2022-12-13 ENCOUNTER — Other Ambulatory Visit: Payer: Medicare Other

## 2022-12-13 ENCOUNTER — Ambulatory Visit: Payer: Medicare Other | Admitting: Hematology

## 2022-12-13 ENCOUNTER — Inpatient Hospital Stay (HOSPITAL_BASED_OUTPATIENT_CLINIC_OR_DEPARTMENT_OTHER): Payer: Medicare Other | Admitting: Hematology

## 2022-12-13 VITALS — BP 176/63 | HR 40 | Temp 97.5°F | Resp 18 | Ht 65.0 in | Wt 119.2 lb

## 2022-12-13 DIAGNOSIS — E871 Hypo-osmolality and hyponatremia: Secondary | ICD-10-CM | POA: Diagnosis not present

## 2022-12-13 DIAGNOSIS — Z800 Family history of malignant neoplasm of digestive organs: Secondary | ICD-10-CM | POA: Diagnosis not present

## 2022-12-13 DIAGNOSIS — Z803 Family history of malignant neoplasm of breast: Secondary | ICD-10-CM | POA: Diagnosis not present

## 2022-12-13 DIAGNOSIS — E039 Hypothyroidism, unspecified: Secondary | ICD-10-CM | POA: Insufficient documentation

## 2022-12-13 DIAGNOSIS — C44311 Basal cell carcinoma of skin of nose: Secondary | ICD-10-CM | POA: Insufficient documentation

## 2022-12-13 DIAGNOSIS — R918 Other nonspecific abnormal finding of lung field: Secondary | ICD-10-CM | POA: Insufficient documentation

## 2022-12-13 DIAGNOSIS — R001 Bradycardia, unspecified: Secondary | ICD-10-CM | POA: Diagnosis not present

## 2022-12-13 DIAGNOSIS — C9110 Chronic lymphocytic leukemia of B-cell type not having achieved remission: Secondary | ICD-10-CM

## 2022-12-13 DIAGNOSIS — Z801 Family history of malignant neoplasm of trachea, bronchus and lung: Secondary | ICD-10-CM | POA: Insufficient documentation

## 2022-12-13 LAB — CMP (CANCER CENTER ONLY)
ALT: 12 U/L (ref 0–44)
AST: 20 U/L (ref 15–41)
Albumin: 4.1 g/dL (ref 3.5–5.0)
Alkaline Phosphatase: 65 U/L (ref 38–126)
Anion gap: 5 (ref 5–15)
BUN: 15 mg/dL (ref 8–23)
CO2: 27 mmol/L (ref 22–32)
Calcium: 10 mg/dL (ref 8.9–10.3)
Chloride: 97 mmol/L — ABNORMAL LOW (ref 98–111)
Creatinine: 0.97 mg/dL (ref 0.44–1.00)
GFR, Estimated: 57 mL/min — ABNORMAL LOW (ref >60)
Glucose, Bld: 87 mg/dL (ref 70–99)
Potassium: 4.4 mmol/L (ref 3.5–5.1)
Sodium: 129 mmol/L — ABNORMAL LOW (ref 135–145)
Total Bilirubin: 0.5 mg/dL (ref 0.3–1.2)
Total Protein: 6 g/dL — ABNORMAL LOW (ref 6.5–8.1)

## 2022-12-13 LAB — CBC WITH DIFFERENTIAL (CANCER CENTER ONLY)
Abs Immature Granulocytes: 0 10*3/uL (ref 0.00–0.07)
Basophils Absolute: 0 10*3/uL (ref 0.0–0.1)
Basophils Relative: 0 %
Eosinophils Absolute: 0.2 10*3/uL (ref 0.0–0.5)
Eosinophils Relative: 1 %
HCT: 33.6 % — ABNORMAL LOW (ref 36.0–46.0)
Hemoglobin: 11.7 g/dL — ABNORMAL LOW (ref 12.0–15.0)
Lymphocytes Relative: 89 %
Lymphs Abs: 17.3 10*3/uL — ABNORMAL HIGH (ref 0.7–4.0)
MCH: 34.4 pg — ABNORMAL HIGH (ref 26.0–34.0)
MCHC: 34.8 g/dL (ref 30.0–36.0)
MCV: 98.8 fL (ref 80.0–100.0)
Monocytes Absolute: 0.4 10*3/uL (ref 0.1–1.0)
Monocytes Relative: 2 %
Neutro Abs: 1.6 10*3/uL — ABNORMAL LOW (ref 1.7–7.7)
Neutrophils Relative %: 8 %
Platelet Count: 121 10*3/uL — ABNORMAL LOW (ref 150–400)
RBC: 3.4 MIL/uL — ABNORMAL LOW (ref 3.87–5.11)
RDW: 12.7 % (ref 11.5–15.5)
Smear Review: NORMAL
WBC Count: 19.4 10*3/uL — ABNORMAL HIGH (ref 4.0–10.5)
nRBC: 0 % (ref 0.0–0.2)

## 2022-12-13 LAB — LACTATE DEHYDROGENASE: LDH: 133 U/L (ref 98–192)

## 2022-12-13 NOTE — Progress Notes (Signed)
[name] Kitchen   HEMATOLOGY/ONCOLOGY CLINIC NOTE  Date of Service: 12/13/2022  Patient Care Team: Shelva Majestic, MD as PCP - General (Family Medicine) Meryl Dare, MD as Consulting Physician (Gastroenterology) Noland Fordyce, MD as Consulting Physician (Obstetrics and Gynecology) Maris Berger, MD as Consulting Physician (Ophthalmology) Verner Chol, [facility] (Inactive) as Pharmacist (Pharmacist)  CHIEF COMPLAINTS/PURPOSE OF CONSULTATION:  Follow-up for continued evaluation and management of CLL  HISTORY OF PRESENTING ILLNESS:  Rebecca [name] is a wonderful 89 y.o. female who has been referred to [place] by Dr Romie Levee MD for evaluation and management of lymphocytosis.  Patient has a history of hypertension, GERD, hypothyroidism was noted to have biliary colic in November 2022 and referred for surgical evaluation due to persistent and worsening symptoms.  In the emergency room the patient was noted to have elevated LFTs and significant leukocytosis of 27.4k.  CT scan showed cholelithiasis, ultrasound abdomen showed cholelithiasis without acute cholecystitis.  MRCP showed distended CBD with choledocholithiasis.  Patient subsequently had ERCP on 12/24 with biliary sphincterotomy and subsequently had her laparoscopic cholecystectomy on 03/28/2021.  Surgical pathology from her cholecystectomy showed chronic cholecystitis without cholelithiasis. 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 Labs ordered 04/09/2021 showed persistent leukocytosis of 19.2k with lymphocyte count of 15.2k hemoglobin of 11.1 and platelets of 219k.  Patient was referred to [place] for further evaluation of CD5 positive lymphoproliferative disorder.  Patient notes no fevers no chills no night sweats. No new lumps or bumps. CT abdomen 04/15/2020 showed enlarged abdominal pelvic lymph nodes.  INTERVAL HISTORY  Rebecca [name] is a 89 y.o. female is here for continued  evaluation and management of her chronic lymphocytic leukemia.   Patient was last seen by me on 06/14/2022 and she reported skin rash/lesion near her nose which did not change in 6 months, but was doing well overall.   Patient is accompanied by her daughter. She notes she has been doing well overall without any new or severe medical concerns since our last visit. Patient's blood pressure is elevated during this visit at 176/63. Patient notes that her blood pressure is always elevated at any doctor's office.   She denies any new infection issues, fever, chills, night sweats, shortness of breath, new skin lesions, abnormal bowel movement, abdominal pain, chest pain, back pain, or leg swelling. She denies any abnormal bleeding.   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 She regularly follows-up with her dermatologist.  Patient notes she has been eating well and has been staying well hydrated.    MEDICAL HISTORY:  Past Medical History:  Diagnosis Date   Anemia    Anxiety    Bifascicular block    Diverticulitis    Diverticulosis    Gastroparesis    GERD (gastroesophageal reflux disease)    Glaucoma    Hemorrhoids    Hyperlipidemia    Hypertension    Hypertension    IBS (irritable bowel syndrome)    Rectal ulcer    SCOLIOSIS, LUMBAR SPINE 03/09/2010   Qualifier: Diagnosis of  By: Lovell Sheehan MD, John E    Segmental colitis [facility])    Tubular adenoma of colon 05/2009    SURGICAL HISTORY: Past Surgical History:  Procedure Laterality Date   APPENDECTOMY     ARCUATE KERATECTOMY     BILIARY DILATION  03/27/2021   Procedure: BILIARY DILATION;  Surgeon: Rob Bunting  P, MD;  Location: [facility];  Service: Gastroenterology;;   CATARACT EXTRACTION, BILATERAL  2015   CHOLECYSTECTOMY N/A 03/28/2021   Procedure: LAPAROSCOPIC CHOLECYSTECTOMY;  Surgeon: Romie Levee, MD;  Location: [facility];  Service: General;   Laterality: N/A;   double pallital tori bone removed      ERCP N/A 03/27/2021   Procedure: ENDOSCOPIC RETROGRADE CHOLANGIOPANCREATOGRAPHY (ERCP);  Surgeon: Rachael Fee, MD;  Location: [facility];  Service: Gastroenterology;  Laterality: N/A;   HERNIA REPAIR     umbilical   KNEE SURGERY Bilateral    morton's neuroma on rt foot and platar facial release     MOUTH SURGERY     "bone shaved off roof of mouth"   REMOVAL OF STONES  03/27/2021   Procedure: REMOVAL OF STONES;  Surgeon: Rachael Fee, MD;  Location: [facility];  Service: Gastroenterology;;   SALIVARY GLAND SURGERY     removal   SPHINCTEROTOMY  03/27/2021   Procedure: SPHINCTEROTOMY;  Surgeon: Rachael Fee, MD;  Location: [facility];  Service: Gastroenterology;;   TONSILLECTOMY      SOCIAL HISTORY: Social History   Socioeconomic History   Marital status: Widowed    Spouse name: Not on file   Number of children: 3   Years of education: Not on file   Highest education level: Not on file  Occupational History   Occupation: Print production planner    Comment: retired  Tobacco Use   Smoking status: Never   Smokeless tobacco: Never  Vaping Use   Vaping status: Never Used  Substance and Sexual Activity   Alcohol use: Yes    Alcohol/week: 0.0 standard drinks of alcohol    Comment: rare   Drug use: No   Sexual activity: Not Currently  Other Topics Concern   Not on file  Social History Narrative   Widowed. 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 30+ years      Hobbies: Enjoys reading, especially mysteries, Walks for exercise   Attends church                  Social Determinants of Health   Financial Resource Strain: Low Risk  (12/27/2021)   Overall Financial Resource Strain (CARDIA)    Difficulty of Paying Living Expenses: Not hard at all  Food Insecurity: No Food Insecurity (03/01/2022)   Hunger  Vital Sign    Worried About Running Out of Food in the Last Year: Never true    Ran Out of Food in the Last Year: Never true  Transportation Needs: No Transportation Needs (03/01/2022)   PRAPARE - Administrator, Civil Service (Medical): No    Lack of Transportation (Non-Medical): No  Physical Activity: Insufficiently Active (12/27/2021)   Exercise Vital Sign    Days of Exercise per Week: 7 days    Minutes of Exercise per Session: 20 min  Stress: No Stress Concern Present (12/27/2021)   [employer] of Occupational Health - Occupational Stress Questionnaire    Feeling of Stress : Not at all  Social Connections: Moderately Isolated (12/27/2021)   Social Connection and Isolation Panel [NHANES]    Frequency of Communication with Friends and Family: More than three times a week    Frequency of Social Gatherings with Friends and Family: More than three times a week    Attends Religious Services: More than 4 times per year    Active  Member of Clubs or Organizations: No    Attends Banker Meetings: Never    Marital Status: Widowed  Intimate Partner Violence: Not At Risk (12/27/2021)   Humiliation, Afraid, Rape, and Kick questionnaire    Fear of Current or Ex-Partner: No    Emotionally Abused: No    Physically Abused: No    Sexually Abused: No    FAMILY HISTORY: Family History  Problem Relation Age of Onset   Diabetes Mother    Heart disease Mother    Hyperlipidemia Mother    Hypertension Mother    Anuerysm Father        abdominal- heavy smoker   Heart disease Father    Hypertension Father    Colon polyps Sister    Lung cancer Sister        smoker   Colon polyps Brother    Diabetes Brother    Hyperlipidemia Brother    Hypertension Brother    Heart disease Brother    Colon cancer Paternal Grandmother        with possible stomach cancer   Migraines Daughter    Kidney Stones Daughter    Colon polyps Daughter    Breast cancer Daughter    Cancer  Daughter        salivary   Colon cancer Maternal Aunt        Rectal cancer   Crohn's disease Neg Hx    Pancreatic cancer Neg Hx     ALLERGIES:  is allergic to levaquin [levofloxacin], sulfamethoxazole-trimethoprim, lactose intolerance (gi), tramadol hcl, augmentin [amoxicillin-pot clavulanate], ciprofloxacin, rosuvastatin, zosyn [piperacillin sod-tazobactam so], adhesive [tape], codeine, and pneumococcal vaccine polyvalent.  MEDICATIONS:  Current Outpatient Medications  Medication Sig Dispense Refill   acetaminophen (TYLENOL) 500 MG tablet Take 500-1,000 mg by mouth every 6 (six) hours as needed for mild pain or headache.     ALPRAZolam (XANAX) 0.25 MG tablet TAKE 1 TABLET BY MOUTH AT BEDTIME AS NEEDED. DO NOT DRIVE FOR 8 HOURS AFTER TAKING AND STOP IF ANY FALLS UNTIL NEXT DOCTOR VISIT 20 tablet 1   atorvastatin (LIPITOR) 10 MG tablet Take 1 tablet (10 mg total) by mouth 3 (three) times a week. 39 tablet 3   Calcium Carb-Cholecalciferol (CALCIUM-VITAMIN D) 600-400 MG-UNIT TABS Take 1 tablet by mouth daily.     Cholecalciferol (VITAMIN D3) 2000 UNITS TABS Take 1 tablet by mouth daily.     cyclobenzaprine (FLEXERIL) 10 MG tablet Take 1 tablet (10 mg total) by mouth at bedtime as needed for muscle spasms. (Patient not taking: Reported on 10/04/2022) 30 tablet 0   diclofenac sodium (VOLTAREN) 1 % GEL Apply 2 g topically 4 (four) times daily. (Patient taking differently: Apply 2 g topically 4 (four) times daily as needed (for pain).) 1 Tube 1   esomeprazole (NEXIUM) 20 MG capsule Take 20 mg by mouth 2 (two) times daily before a meal.     estradiol (ESTRACE) 0.1 MG/GM vaginal cream Place 0.5 g vaginally See admin instructions. Place 0.5 g vaginally two to three times a week     levothyroxine (SYNTHROID) 88 MCG tablet TAKE 1 TABLET(88 MCG) BY MOUTH DAILY BEFORE BREAKFAST 90 tablet 3   loperamide (IMODIUM) 2 MG capsule Take 1 capsule (2 mg total) by mouth 4 (four) times daily as needed for diarrhea or  loose stools. 12 capsule 0   losartan (COZAAR) 100 MG tablet Take 1 tablet (100 mg total) by mouth daily. 90 tablet 3   metoCLOPramide (REGLAN) 5 MG tablet TAKE  1 TABLET BY MOUTH TWICE DAILY WITH MEALS 60 tablet 3   nystatin-triamcinolone (MYCOLOG II) cream Apply 1 Application topically See admin instructions. Apply to the affected area 2 times a week as needed for irritation     ondansetron (ZOFRAN-ODT) 4 MG disintegrating tablet Take 1 tablet (4 mg total) by mouth every 8 (eight) hours as needed for nausea or vomiting. (Patient not taking: Reported on 10/04/2022) 20 tablet 0   timolol (TIMOPTIC) 0.5 % ophthalmic solution Place 1 drop into both eyes 2 (two) times daily.     vitamin B-12 (CYANOCOBALAMIN) 500 MCG tablet Take 500 mcg by mouth every morning.     vitamin C (ASCORBIC ACID) 500 MG tablet Take 500 mg by mouth daily.     VYZULTA 0.024 % SOLN Place 1 drop into both eyes at bedtime.     No current facility-administered medications for this visit.    REVIEW OF SYSTEMS:   10 Point review of Systems was done is negative except as noted above.  PHYSICAL EXAMINATION:  .BP (!) 176/63 (BP Location: Right Arm, Patient Position: Sitting) Comment: nurse notified  Pulse (!) 40 Comment: nurse notified  Temp (!) 97.5 F (36.4 C) (Temporal)   Resp 18   Ht 5\' 5"  (1.651 m)   Wt 119 lb 3.2 oz (54.1 kg)   SpO2 100%   BMI 19.84 kg/m  NAD GENERAL:alert, in no acute distress and comfortable SKIN: no acute rashes, no significant lesions EYES: conjunctiva are pink and non-injected, sclera anicteric NECK: supple, no JVD LYMPH:  no palpable lymphadenopathy in the cervical, axillary or inguinal regions LUNGS: clear to auscultation b/l with normal respiratory effort HEART: regular rate & rhythm ABDOMEN:  normoactive bowel sounds , non tender, not distended. Extremity: no pedal edema PSYCH: alert & oriented x 3 with fluent speech NEURO: no focal motor/sensory deficits  LABORATORY DATA:  I have  reviewed the data as listed     Latest Ref Rng & Units 12/13/2022   12:51 PM 10/04/2022    9:33 AM 06/14/2022   12:40 PM  CBC  WBC 4.0 - 10.5 K/uL 19.4  29.3 Repeated and verified X2.  24.2   Hemoglobin 12.0 - 15.0 g/dL 45.4  09.8  11.9   Hematocrit 36.0 - 46.0 % 33.6  37.5  36.6   Platelets 150 - 400 K/uL 121  193.0  190       Latest Ref Rng & Units 12/13/2022   12:51 PM 10/04/2022    9:33 AM 06/14/2022   12:40 PM  CMP  Glucose 70 - 99 mg/dL 87  88  147   BUN 8 - 23 mg/dL 15  17  11    Creatinine 0.44 - 1.00 mg/dL 8.29  5.62  1.30   Sodium 135 - 145 mmol/L 129  133  130   Potassium 3.5 - 5.1 mmol/L 4.4  4.9  4.3   Chloride 98 - 111 mmol/L 97  97  97   CO2 22 - 32 mmol/L 27  28  25    Calcium 8.9 - 10.3 mg/dL 86.5  78.4  69.6   Total Protein 6.5 - 8.1 g/dL 6.0  6.1  6.5   Total Bilirubin 0.3 - 1.2 mg/dL 0.5  0.7  0.5   Alkaline Phos 38 - 126 U/L 65  66  74   AST 15 - 41 U/L 20  20  18    ALT 0 - 44 U/L 12  18  10     Component  Latest Ref Rng & Units 05/05/2021  Hep B Core Total Ab     NON REACTIVE NON REACTIVE  Hepatitis B Surface Ag     NON REACTIVE NON REACTIVE  HCV Ab     NON REACTIVE NON REACTIVE  LDH     98 - 192 U/L 123   Surgical Pathology  CASE: [id number]  PATIENT: Garrett Buzan  Flow Pathology Report      Clinical history: CLL    DIAGNOSIS:   -  Monoclonal B-cell population with co-expression of CD5 comprises 80%  of all lymphocytes  -  See comment   COMMENT:   The phenotypic features are consistent with involvement by non-Hodgkin  B-cell lymphoma. Given the presence of CD5 expression, the differential  diagnosis includes chronic lymphocytic leukemia/small lymphocytic  lymphoma and mantle cell lymphoma.   PATHOLOGY SURGICAL PATHOLOGY   THIS IS AN ADDENDUM REPORT   CASE: [id number]  PATIENT: Wanda Plump  Surgical Pathology Report  Addendum   Reason for Addendum #1:  Immunohistochemistry results   Clinical History: Acute  Cholecystitis (jmc)      FINAL MICROSCOPIC DIAGNOSIS:   A. GALLBLADDER, CHOLECYSTECTOMY:  - Chronic cholecystitis, without cholelithiasis.  - Atypical CD5+ B cell infiltrate, most suggestive of involvement by  CLL/SLL.   COMMENT:   A. Given the presence of marked lymphocytic inflammation,  immunohistochemical studies were performed. Studies show a significant  predominance of CD20+ B cells, with aberrant co-expression of CD5 and  CD23. There are relatively fewer background CD3+ T cells. The features  are most suggestive of involvement by CLL/SLL. An immunohistochemical  study for Cyclin-D1 is pending, and will be reported as an addendum.  Recommend correlation with peripheral blood findings and evaluation for  other potential sites of adenopathy.     RADIOGRAPHIC STUDIES: I have personally reviewed the radiological images as listed and agreed with the findings in the report. No results found.  ASSESSMENT & PLAN:   89 year old female with recent laparoscopic cholecystectomy noted to have  1) recently diagnosed CLL/SLL with 13 q. and 11 q. Deletions. 2) CD5 positive lymphoid infiltrate in gallbladder pathology suggestive of involvement by CLL/SLL -CT abdomen was done recently on 03/25/2021-did not show significant lymphadenopathy or hepatosplenomegaly.  Noted to have some lung nodules in the lower lobes.  3) .  Basal cell carcinoma on the right side of the nose Seeing Dr. Karlyn Agee at [facility] dermatology for management  -Continue active surveillance for recurrent skin cancers since the CLL especially with 13 q. deletion would be a risk factor.  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 PLAN: -No clinical or lab results suggestive of significant progression of chronic lymphocytic leukemia. -No treatment needed at this time for CLL. -Discussed lab results from today, 12/13/2022, with the patient. CBC shows elevated but improved WBC count of 19.4 K,  slight decreased hemoglobin at 11.7 g/dL, decreased hematocrit of 33.6%, and deceased platelets at 121 K.   -Continue to follow-up with dermatologist for continued evaluation and management of cutaneous squamous cell and basal cell carcinomas.  Again discussed how CLL does increase her risk for these as does the specific finding of the 13 q. Deletion and age. --Recommend to stay up-to-speed with influenza vaccine, COVID-19 Booster, RSV vaccine, and other age related vaccines.    5) hyponatremia-this appears chronic for about a year with sodium levels fluctuating from 125-133.  Likely multifactorial including poor solute intake and potentially from her hypothyroidism. 6) chronic bradycardia -likely multifactorial.  Also has hypothyroidism. PLAN -Continue follow-up with PCP for management of these chronic medical issues. -Counseled patient if dizzy to call her PCP immediately. -Will need to follow-up with PCP to monitor and manage her Synthroid replacement since bradycardia might be reflection of hypothyroidism that need some adjustment of her Synthroid dose.    FOLLOW-UP: RTC with Dr Candise Che with labs in 6 months  The total time spent in the appointment was 30 minutes* .  All of the patient's questions were answered with apparent satisfaction. The patient knows to call the clinic with any problems, questions or concerns.   Wyvonnia Lora MD MS AAHIVMS [facility] Hematology/Oncology Physician [facility]  .*Total Encounter Time as defined by the Centers for Medicare and Medicaid Services includes, in addition to the face-to-face time of a patient visit (documented in the note above) non-face-to-face time: obtaining and reviewing outside history, ordering and reviewing medications, tests or procedures, care coordination (communications with other health care professionals or caregivers) and documentation in the medical record.   I,Param Shah,acting as a Neurosurgeon for Wyvonnia Lora, MD.,have  documented all relevant documentation on the behalf of Wyvonnia Lora, MD,as directed by  Wyvonnia Lora, MD while in the presence of Wyvonnia Lora, MD.   .I have reviewed the above documentation for accuracy and completeness, and I agree with the above. Johney Maine MD

## 2022-12-20 DIAGNOSIS — Z23 Encounter for immunization: Secondary | ICD-10-CM | POA: Diagnosis not present

## 2023-01-03 DIAGNOSIS — H401113 Primary open-angle glaucoma, right eye, severe stage: Secondary | ICD-10-CM | POA: Diagnosis not present

## 2023-01-03 DIAGNOSIS — H401122 Primary open-angle glaucoma, left eye, moderate stage: Secondary | ICD-10-CM | POA: Diagnosis not present

## 2023-01-05 ENCOUNTER — Ambulatory Visit: Payer: Medicare Other | Admitting: *Deleted

## 2023-01-05 DIAGNOSIS — Z0000 Encounter for general adult medical examination without abnormal findings: Secondary | ICD-10-CM

## 2023-01-05 NOTE — Patient Instructions (Signed)
Rebecca [name] , Thank you for taking time to come for your Medicare Wellness Visit. I appreciate your ongoing commitment to your health goals. Please review the following plan we discussed and let me know if I can assist you in the future.   Screening recommendations/referrals: Colonoscopy: no longer required Mammogram: no longer required Bone Density: up to date Recommended yearly ophthalmology/optometry visit for glaucoma screening and checkup Recommended yearly dental visit for hygiene and checkup  Vaccinations: Influenza vaccine: Education provided Pneumococcal vaccine: Education provided Tdap vaccine: Education provided Shingles vaccine:       Preventive Care 65 Years and Older, Female Preventive care refers to lifestyle choices and visits with your health care provider that can promote health and wellness. What does preventive care include? A yearly physical exam. This is also called an annual well check. Dental exams once or twice a year. Routine eye exams. Ask your health care provider how often you should have your eyes checked. Personal lifestyle choices, including: Daily care of your teeth and gums. Regular physical activity. Eating a healthy diet. Avoiding tobacco and drug use. Limiting alcohol use. Practicing safe sex. Taking low-dose aspirin every day. Taking vitamin and mineral supplements as recommended by your health care provider. What happens during an annual well check? The services and screenings done by your health care provider during your annual well check will depend on your age, overall health, lifestyle risk factors, and family history of disease. Counseling  Your health care provider may ask you questions about your: Alcohol use. Tobacco use. Drug use. Emotional well-being. Home and relationship well-being. Sexual activity. Eating habits. History of falls. Memory and ability to understand (cognition). Work and work Astronomer. Reproductive  health. Screening  You may have the following tests or measurements: Height, weight, and BMI. Blood pressure. Lipid and cholesterol levels. These may be checked every 5 years, or more frequently if you are over 65 years old. Skin check. Lung cancer screening. You may have this screening every year starting at age 68 if you have a 30-pack-year history of smoking and currently smoke or have quit within the past 15 years. Fecal occult blood test (FOBT) of the stool. You may have this test every year starting at age 76. Flexible sigmoidoscopy or colonoscopy. You may have a sigmoidoscopy every 5 years or a colonoscopy every 10 years starting at age 34. Hepatitis C blood test. Hepatitis B blood test. Sexually transmitted disease (STD) testing. Diabetes screening. This is done by checking your blood sugar (glucose) after you have not eaten for a while (fasting). You may have this done every 1-3 years. Bone density scan. This is done to screen for osteoporosis. You may have this done starting at age 42. Mammogram. This may be done every 1-2 years. Talk to your health care provider about how often you should have regular mammograms. Talk with your health care provider about your test results, treatment options, and if necessary, the need for more tests. Vaccines  Your health care provider may recommend certain vaccines, such as: Influenza vaccine. This is recommended every year. Tetanus, diphtheria, and acellular pertussis (Tdap, Td) vaccine. You may need a Td booster every 10 years. Zoster vaccine. You may need this after age 58. Pneumococcal 13-valent conjugate (PCV13) vaccine. One dose is recommended after age 4. Pneumococcal polysaccharide (PPSV23) vaccine. One dose is recommended after age 49. Talk to your health care provider about which screenings and vaccines you need and how often you need them. This information is not intended to  replace advice given to you by your health care provider.  Make sure you discuss any questions you have with your health care provider. Document Released: 04/17/2015 Document Revised: 12/09/2015 Document Reviewed: 01/20/2015 Elsevier Interactive Patient Education  2017 [employer].  Fall Prevention in the Home Falls can cause injuries. They can happen to people of all ages. There are many things you can do to make your home safe and to help prevent falls. What can I do on the outside of my home? Regularly fix the edges of walkways and driveways and fix any cracks. Remove anything that might make you trip as you walk through a door, such as a raised step or threshold. Trim any bushes or trees on the path to your home. Use bright outdoor lighting. Clear any walking paths of anything that might make someone trip, such as rocks or tools. Regularly check to see if handrails are loose or broken. Make sure that both sides of any steps have handrails. Any raised decks and porches should have guardrails on the edges. Have any leaves, snow, or ice cleared regularly. Use sand or salt on walking paths during winter. Clean up any spills in your garage right away. This includes oil or grease spills. What can I do in the bathroom? Use night lights. Install grab bars by the toilet and in the tub and shower. Do not use towel bars as grab bars. Use non-skid mats or decals in the tub or shower. If you need to sit down in the shower, use a plastic, non-slip stool. Keep the floor dry. Clean up any water that spills on the floor as soon as it happens. Remove soap buildup in the tub or shower regularly. Attach bath mats securely with double-sided non-slip rug tape. Do not have throw rugs and other things on the floor that can make you trip. What can I do in the bedroom? Use night lights. Make sure that you have a light by your bed that is easy to reach. Do not use any sheets or blankets that are too big for your bed. They should not hang down onto the floor. Have a  firm chair that has side arms. You can use this for support while you get dressed. Do not have throw rugs and other things on the floor that can make you trip. What can I do in the kitchen? Clean up any spills right away. Avoid walking on wet floors. Keep items that you use a lot in easy-to-reach places. If you need to reach something above you, use a strong step stool that has a grab bar. Keep electrical cords out of the way. Do not use floor polish or wax that makes floors slippery. If you must use wax, use non-skid floor wax. Do not have throw rugs and other things on the floor that can make you trip. What can I do with my stairs? Do not leave any items on the stairs. Make sure that there are handrails on both sides of the stairs and use them. Fix handrails that are broken or loose. Make sure that handrails are as long as the stairways. Check any carpeting to make sure that it is firmly attached to the stairs. Fix any carpet that is loose or worn. Avoid having throw rugs at the top or bottom of the stairs. If you do have throw rugs, attach them to the floor with carpet tape. Make sure that you have a light switch at the top of the stairs and the  bottom of the stairs. If you do not have them, ask someone to add them for you. What else can I do to help prevent falls? Wear shoes that: Do not have high heels. Have rubber bottoms. Are comfortable and fit you well. Are closed at the toe. Do not wear sandals. If you use a stepladder: Make sure that it is fully opened. Do not climb a closed stepladder. Make sure that both sides of the stepladder are locked into place. Ask someone to hold it for you, if possible. Clearly mark and make sure that you can see: Any grab bars or handrails. First and last steps. Where the edge of each step is. Use tools that help you move around (mobility aids) if they are needed. These include: Canes. Walkers. Scooters. Crutches. Turn on the lights when you  go into a dark area. Replace any light bulbs as soon as they burn out. Set up your furniture so you have a clear path. Avoid moving your furniture around. If any of your floors are uneven, fix them. If there are any pets around you, be aware of where they are. Review your medicines with your doctor. Some medicines can make you feel dizzy. This can increase your chance of falling. Ask your doctor what other things that you can do to help prevent falls. This information is not intended to replace advice given to you by your health care provider. Make sure you discuss any questions you have with your health care provider. Document Released: 01/15/2009 Document Revised: 08/27/2015 Document Reviewed: 04/25/2014 Elsevier Interactive Patient Education  2017 [employer].

## 2023-01-05 NOTE — Progress Notes (Addendum)
Subjective:   Rebecca [name] is a 89 y.o. female who presents for Medicare Annual (Subsequent) preventive examination.  Visit Complete: Virtual  I connected with  Rebecca [name] on 01/05/23 by a audio enabled telemedicine application and verified that I am speaking with the correct person using two identifiers.  Patient Location: Home  Provider Location: Home Office  I discussed the limitations of evaluation and management by telemedicine. The patient expressed understanding and agreed to proceed.  Because this visit was a virtual/telehealth visit,  certain criteria was not obtained, such a blood pressure, CBG if applicable, and timed get up and go. Any medications not marked as "taking" were not mentioned during the medication reconciliation part of the visit. Any vitals not documented were not able to be obtained due to this being a telehealth visit or patient was unable to self-report a recent blood pressure reading due to a lack of equipment at home via telehealth. Vitals that have been documented are verbally provided by the patient.     Cardiac Risk Factors include: advanced age (>63men, >45 women);hypertension     Objective:    There were no vitals filed for this visit. There is no height or weight on file to calculate BMI.     01/05/2023   11:06 AM 03/16/2022   10:14 AM 02/25/2022    5:13 PM 12/27/2021   10:31 AM 03/25/2021    9:08 PM 03/25/2021    3:44 PM 11/19/2019   11:29 AM  Advanced Directives  Does Patient Have a Medical Advance Directive? Yes Yes Yes Yes Yes No Yes  Type of Advance Directive Living will  Healthcare Power of [place];Living will Healthcare Power of [place];Living will Healthcare Power of [place];Living will  Healthcare Power of [place];Living will  Does patient want to make changes to medical advance directive?  No - Patient declined   No - Patient declined  No - Patient declined  Copy of Healthcare Power of Attorney in Chart? No - copy  requested   No - copy requested No - copy requested  No - copy requested    Current Medications (verified) Outpatient Encounter Medications as of 01/05/2023  Medication Sig   acetaminophen (TYLENOL) 500 MG tablet Take 500-1,000 mg by mouth every 6 (six) hours as needed for mild pain or headache.   ALPRAZolam (XANAX) 0.25 MG tablet TAKE 1 TABLET BY MOUTH AT BEDTIME AS NEEDED. DO NOT DRIVE FOR 8 HOURS AFTER TAKING AND STOP IF ANY FALLS UNTIL NEXT DOCTOR VISIT   atorvastatin (LIPITOR) 10 MG tablet Take 1 tablet (10 mg total) by mouth 3 (three) times a week.   Calcium Carb-Cholecalciferol (CALCIUM-VITAMIN D) 600-400 MG-UNIT TABS Take 1 tablet by mouth daily.   Cholecalciferol (VITAMIN D3) 2000 UNITS TABS Take 1 tablet by mouth daily.   cyclobenzaprine (FLEXERIL) 10 MG tablet Take 1 tablet (10 mg total) by mouth at bedtime as needed for muscle spasms.   diclofenac sodium (VOLTAREN) 1 % GEL Apply 2 g topically 4 (four) times daily. (Patient taking differently: Apply 2 g topically 4 (four) times daily as needed (for pain).)   esomeprazole (NEXIUM) 20 MG capsule Take 20 mg by mouth 2 (two) times daily before a meal.   estradiol (ESTRACE) 0.1 MG/GM vaginal cream Place 0.5 g vaginally See admin instructions. Place 0.5 g vaginally two to three times a week   levothyroxine (SYNTHROID) 88 MCG tablet TAKE 1 TABLET(88 MCG) BY MOUTH DAILY BEFORE BREAKFAST   loperamide (IMODIUM) 2 MG capsule Take  1 capsule (2 mg total) by mouth 4 (four) times daily as needed for diarrhea or loose stools.   losartan (COZAAR) 100 MG tablet Take 1 tablet (100 mg total) by mouth daily.   metoCLOPramide (REGLAN) 5 MG tablet TAKE 1 TABLET BY MOUTH TWICE DAILY WITH MEALS   nystatin-triamcinolone (MYCOLOG II) cream Apply 1 Application topically See admin instructions. Apply to the affected area 2 times a week as needed for irritation   ondansetron (ZOFRAN-ODT) 4 MG disintegrating tablet Take 1 tablet (4 mg total) by mouth every 8 (eight)  hours as needed for nausea or vomiting.   timolol (TIMOPTIC) 0.5 % ophthalmic solution Place 1 drop into both eyes 2 (two) times daily.   vitamin B-12 (CYANOCOBALAMIN) 500 MCG tablet Take 500 mcg by mouth every morning.   vitamin C (ASCORBIC ACID) 500 MG tablet Take 500 mg by mouth daily.   VYZULTA 0.024 % SOLN Place 1 drop into both eyes at bedtime.   [DISCONTINUED] olmesartan (BENICAR) 20 MG tablet Take 20 mg by mouth daily.     No facility-administered encounter medications on file as of 01/05/2023.    Allergies (verified) Levaquin [levofloxacin], Sulfamethoxazole-trimethoprim, Lactose intolerance (gi), Tramadol hcl, Augmentin [amoxicillin-pot clavulanate], Ciprofloxacin, Rosuvastatin, Zosyn [piperacillin sod-tazobactam so], Adhesive [tape], Codeine, and Pneumococcal vaccine polyvalent   History: Past Medical History:  Diagnosis Date   Anemia    Anxiety    Bifascicular block    Diverticulitis    Diverticulosis    Gastroparesis    GERD (gastroesophageal reflux disease)    Glaucoma    Hemorrhoids    Hyperlipidemia    Hypertension    Hypertension    IBS (irritable bowel syndrome)    Rectal ulcer    SCOLIOSIS, LUMBAR SPINE 03/09/2010   Qualifier: Diagnosis of  By: Lovell Sheehan MD, John E    Segmental colitis [facility])    Tubular adenoma of colon 05/2009   Past Surgical History:  Procedure Laterality Date   APPENDECTOMY     ARCUATE KERATECTOMY     BILIARY DILATION  03/27/2021   Procedure: BILIARY DILATION;  Surgeon: Rachael Fee, MD;  Location: [facility];  Service: Gastroenterology;;   CATARACT EXTRACTION, BILATERAL  2015   CHOLECYSTECTOMY N/A 03/28/2021   Procedure: LAPAROSCOPIC CHOLECYSTECTOMY;  Surgeon: Romie Levee, MD;  Location: [facility];  Service: General;  Laterality: N/A;   double pallital tori bone removed      ERCP N/A 03/27/2021   Procedure: ENDOSCOPIC RETROGRADE CHOLANGIOPANCREATOGRAPHY (ERCP);  Surgeon: Rachael Fee, MD;  Location: [facility];  Service:  Gastroenterology;  Laterality: N/A;   HERNIA REPAIR     umbilical   KNEE SURGERY Bilateral    morton's neuroma on rt foot and platar facial release     MOUTH SURGERY     "bone shaved off roof of mouth"   REMOVAL OF STONES  03/27/2021   Procedure: REMOVAL OF STONES;  Surgeon: Rachael Fee, MD;  Location: [facility];  Service: Gastroenterology;;   SALIVARY GLAND SURGERY     removal   SPHINCTEROTOMY  03/27/2021   Procedure: SPHINCTEROTOMY;  Surgeon: Rachael Fee, MD;  Location: [facility];  Service: Gastroenterology;;   TONSILLECTOMY     Family History  Problem Relation Age of Onset   Diabetes Mother    Heart disease Mother    Hyperlipidemia Mother    Hypertension Mother    Anuerysm Father        abdominal- heavy smoker   Heart disease Father    Hypertension Father  Colon polyps Sister    Lung cancer Sister        smoker   Colon polyps Brother    Diabetes Brother    Hyperlipidemia Brother    Hypertension Brother    Heart disease Brother    Colon cancer Paternal Grandmother        with possible stomach cancer   Migraines Daughter    Kidney Stones Daughter    Colon polyps Daughter    Breast cancer Daughter    Cancer Daughter        salivary   Colon cancer Maternal Aunt        Rectal cancer   Crohn's disease Neg Hx    Pancreatic cancer Neg Hx    Social History   Socioeconomic History   Marital status: Widowed    Spouse name: Not on file   Number of children: 3   Years of education: Not on file   Highest education level: Not on file  Occupational History   Occupation: Print production planner    Comment: retired  Tobacco Use   Smoking status: Never   Smokeless tobacco: Never  Vaping Use   Vaping status: Never Used  Substance and Sexual Activity   Alcohol use: Yes    Alcohol/week: 0.0 standard drinks of alcohol    Comment: rare   Drug use: No   Sexual activity: Not Currently  Other Topics Concern   Not on file  Social History Narrative   Widowed.  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 30+ years      Hobbies: Enjoys reading, especially mysteries, Walks for exercise   Attends church                  Social Determinants of Health   Financial Resource Strain: Low Risk  (01/05/2023)   Overall Financial Resource Strain (CARDIA)    Difficulty of Paying Living Expenses: Not hard at all  Food Insecurity: No Food Insecurity (01/05/2023)   Hunger Vital Sign    Worried About Running Out of Food in the Last Year: Never true    Ran Out of Food in the Last Year: Never true  Transportation Needs: No Transportation Needs (01/05/2023)   PRAPARE - Administrator, Civil Service (Medical): No    Lack of Transportation (Non-Medical): No  Physical Activity: Insufficiently Active (01/05/2023)   Exercise Vital Sign    Days of Exercise per Week: 4 days    Minutes of Exercise per Session: 20 min  Stress: No Stress Concern Present (01/05/2023)   [employer] of Occupational Health - Occupational Stress Questionnaire    Feeling of Stress : Not at all  Social Connections: Moderately Isolated (01/05/2023)   Social Connection and Isolation Panel [NHANES]    Frequency of Communication with Friends and Family: More than three times a week    Frequency of Social Gatherings with Friends and Family: Once a week    Attends Religious Services: More than 4 times per year    Active Member of [employer] or Organizations: No    Attends Banker Meetings: Never    Marital Status: Widowed    Tobacco Counseling Counseling given: Not Answered   Clinical Intake:  Pre-visit preparation completed: Yes  Pain : No/denies pain     Diabetes: No  How often do you need to have someone help you when you read instructions, pamphlets,  or other written materials from your doctor or pharmacy?: 1 - Never  Interpreter Needed?:  No  Information entered by :: Remi Haggard LPN   Activities of Daily Living    01/05/2023   11:07 AM  In your present state of health, do you have any difficulty performing the following activities:  Hearing? 1  Vision? 1  Difficulty concentrating or making decisions? 0  Walking or climbing stairs? 1  Dressing or bathing? 0  Doing errands, shopping? 0  Preparing Food and eating ? N  Using the Toilet? N  In the past six months, have you accidently leaked urine? N  Do you have problems with loss of bowel control? N  Managing your Medications? N  Managing your Finances? N  Housekeeping or managing your Housekeeping? N    Patient Care Team: Shelva Majestic, MD as PCP - General (Family Medicine) Meryl Dare, MD as Consulting Physician (Gastroenterology) Noland Fordyce, MD as Consulting Physician (Obstetrics and Gynecology) Maris Berger, MD as Consulting Physician (Ophthalmology) Verner Chol, [facility] (Inactive) as Pharmacist (Pharmacist)  Indicate any recent Medical Services you may have received from other than [facility] providers in the past year (date may be approximate).     Assessment:   This is a routine wellness examination for Rebecca [name].  Hearing/Vision screen Hearing Screening - Comments:: Bilateral hearing aids Vision Screening - Comments:: Up to date [facility]   Goals Addressed             This Visit's Progress    Patient Stated       Continue current lifestyle       Depression Screen    01/05/2023   11:15 AM 09/28/2022    2:05 PM 12/27/2021   10:29 AM 09/16/2021   11:39 AM 05/18/2021   10:26 AM 04/09/2021   11:05 AM 03/20/2020   10:19 AM  PHQ 2/9 Scores  PHQ - 2 Score 0 0 0 0 0 0 0  PHQ- 9 Score 0   0 0 3     Fall Risk    01/05/2023   11:04 AM 09/28/2022    2:05 PM 12/27/2021   10:32 AM 09/16/2021   11:40 AM 05/18/2021   10:26 AM  Fall Risk   Falls in the past year? 0 0 1 0 0  Number falls in past yr: 0 0 1 0   Injury  with Fall? 0 0 0 0   Risk for fall due to :  No Fall Risks Impaired vision No Fall Risks   Follow up Falls evaluation completed;Education provided;Falls prevention discussed Falls evaluation completed Falls prevention discussed Falls evaluation completed     MEDICARE RISK AT HOME: Medicare Risk at Home If so, are there any without handrails?: No Home free of loose throw rugs in walkways, pet beds, electrical cords, etc?: Yes Adequate lighting in your home to reduce risk of falls?: Yes Life alert?: No Use of a cane, walker or w/c?: Yes Grab bars in the bathroom?: Yes Shower chair or bench in shower?: No Elevated toilet seat or a handicapped toilet?: Yes  TIMED UP AND GO:  Was the test performed?  No    Cognitive Function:    02/08/2017    1:55 PM  MMSE - Mini Mental State Exam  Not completed: --        01/05/2023   11:08 AM 12/27/2021   10:33 AM 11/19/2019   11:34 AM  6CIT Screen  What Year? 0 points 0 points  0 points  What month? 0 points 0 points 0 points  What time? 0 points -- 0 points  Count back from 20 0 points -- 0 points  Months in reverse 0 points -- 0 points  Repeat phrase 0 points -- 0 points  Total Score 0 points  0 points    Immunizations Immunization History  Administered Date(s) Administered   Fluad Quad(high Dose 65+) 12/31/2018, 01/06/2021   H1N1 06/02/2008   Influenza Split 12/29/2010   Influenza Whole 04/04/2001, 02/01/2007, 12/24/2007, 12/16/2008, 01/15/2009, 01/05/2010   Influenza, High Dose Seasonal PF 12/31/2014, 01/01/2016, 01/11/2017, 01/02/2018, 01/16/2022   Influenza,inj,Quad PF,6+ Mos 12/19/2012, 12/25/2013   Influenza-Unspecified 02/10/2020   PFIZER Comirnaty(Gray Top)Covid-19 Tri-Sucrose Vaccine 01/06/2022   PFIZER(Purple Top)SARS-COV-2 Vaccination 05/06/2019, 05/27/2019, 02/10/2020, 01/10/2021, 12/14/2022   Pneumococcal Polysaccharide-23 03/05/1999   Td 04/05/1999, 07/28/2008   Zoster Recombinant(Shingrix) 08/04/2016, 10/12/2016    Zoster, Live 10/24/2007    TDAP status: Due, Education has been provided regarding the importance of this vaccine. Advised may receive this vaccine at local pharmacy or Health Dept. Aware to provide a copy of the vaccination record if obtained from local pharmacy or Health Dept. Verbalized acceptance and understanding.  Flu Vaccine status: Due, Education has been provided regarding the importance of this vaccine. Advised may receive this vaccine at local pharmacy or Health Dept. Aware to provide a copy of the vaccination record if obtained from local pharmacy or Health Dept. Verbalized acceptance and understanding.  Pneumococcal vaccine status: Due, Education has been provided regarding the importance of this vaccine. Advised may receive this vaccine at local pharmacy or Health Dept. Aware to provide a copy of the vaccination record if obtained from local pharmacy or Health Dept. Verbalized acceptance and understanding.  Covid-19 vaccine status: Completed vaccines  Qualifies for Shingles Vaccine? No   Zostavax completed Yes   Shingrix Completed?: Yes  Screening Tests Health Maintenance  Topic Date Due   INFLUENZA VACCINE  07/03/2023 (Originally 11/03/2022)   Pneumonia Vaccine 46+ Years old (2 of 2 - PCV) 01/05/2024 (Originally 03/04/2000)   COVID-19 Vaccine (7 - 2023-24 season) 02/08/2023   Medicare Annual Wellness (AWV)  01/05/2024   DEXA SCAN  Completed   Zoster Vaccines- Shingrix  Completed   HPV VACCINES  Aged Out   DTaP/Tdap/Td  Discontinued   MAMMOGRAM  Discontinued    Health Maintenance  There are no preventive care reminders to display for this patient.   Colorectal cancer screening: No longer required.   Mammogram status: No longer required due to age.  Bone Density status: Completed 2024. Results reflect: Bone density results: OSTEOPOROSIS. Repeat every 2 years.  Lung Cancer Screening: (Low Dose CT Chest recommended if Age 68-80 years, 20 pack-year currently smoking OR  have quit w/in 15years.) does not qualify.   Lung Cancer Screening Referral:   Additional Screening:  Hepatitis C Screening: does not qualify;  Vision Screening: Recommended annual ophthalmology exams for early detection of glaucoma and other disorders of the eye. Is the patient up to date with their annual eye exam?  Yes  Who is the provider or what is the name of the office in which the patient attends annual eye exams? [place] If pt is not established with a provider, would they like to be referred to a provider to establish care? No .   Dental Screening: Recommended annual dental exams for proper oral hygiene    Community Resource Referral / Chronic Care Management: CRR required this visit?  No   CCM required this visit?  No     Plan:     I have personally reviewed and noted the following in the patient's chart:   Medical and social history Use of alcohol, tobacco or illicit drugs  Current medications and supplements including opioid prescriptions. Patient is not currently taking opioid prescriptions. Functional ability and status Nutritional status Physical activity Advanced directives List of other physicians Hospitalizations, surgeries, and ER visits in previous 12 months Vitals Screenings to include cognitive, depression, and falls Referrals and appointments  In addition, I have reviewed and discussed with patient certain preventive protocols, quality metrics, and best practice recommendations. A written personalized care plan for preventive services as well as general preventive health recommendations were provided to patient.     Remi Haggard, LPN   14/10/8293   After Visit Summary: (MyChart) Due to this being a telephonic visit, the after visit summary with patients personalized plan was offered to patient via MyChart   Nurse Notes:

## 2023-01-09 ENCOUNTER — Other Ambulatory Visit: Payer: Self-pay | Admitting: Family Medicine

## 2023-01-11 DIAGNOSIS — H401113 Primary open-angle glaucoma, right eye, severe stage: Secondary | ICD-10-CM | POA: Diagnosis not present

## 2023-01-18 DIAGNOSIS — Z85828 Personal history of other malignant neoplasm of skin: Secondary | ICD-10-CM | POA: Diagnosis not present

## 2023-01-18 DIAGNOSIS — L821 Other seborrheic keratosis: Secondary | ICD-10-CM | POA: Diagnosis not present

## 2023-01-20 DIAGNOSIS — Z6822 Body mass index (BMI) 22.0-22.9, adult: Secondary | ICD-10-CM | POA: Diagnosis not present

## 2023-01-20 DIAGNOSIS — Z4689 Encounter for fitting and adjustment of other specified devices: Secondary | ICD-10-CM | POA: Diagnosis not present

## 2023-01-20 DIAGNOSIS — Z124 Encounter for screening for malignant neoplasm of cervix: Secondary | ICD-10-CM | POA: Diagnosis not present

## 2023-01-20 DIAGNOSIS — Z01411 Encounter for gynecological examination (general) (routine) with abnormal findings: Secondary | ICD-10-CM | POA: Diagnosis not present

## 2023-01-20 DIAGNOSIS — N952 Postmenopausal atrophic vaginitis: Secondary | ICD-10-CM | POA: Diagnosis not present

## 2023-01-20 DIAGNOSIS — M810 Age-related osteoporosis without current pathological fracture: Secondary | ICD-10-CM | POA: Diagnosis not present

## 2023-01-20 DIAGNOSIS — Z01419 Encounter for gynecological examination (general) (routine) without abnormal findings: Secondary | ICD-10-CM | POA: Diagnosis not present

## 2023-01-20 DIAGNOSIS — N8110 Cystocele, unspecified: Secondary | ICD-10-CM | POA: Diagnosis not present

## 2023-01-20 DIAGNOSIS — N816 Rectocele: Secondary | ICD-10-CM | POA: Diagnosis not present

## 2023-01-25 DIAGNOSIS — H401122 Primary open-angle glaucoma, left eye, moderate stage: Secondary | ICD-10-CM | POA: Diagnosis not present

## 2023-01-31 ENCOUNTER — Other Ambulatory Visit: Payer: Self-pay | Admitting: Family Medicine

## 2023-01-31 DIAGNOSIS — Z23 Encounter for immunization: Secondary | ICD-10-CM | POA: Diagnosis not present

## 2023-01-31 NOTE — Telephone Encounter (Incomplete)
Prescription Request  01/31/2023  LOV: 10/04/2022  What is the name of the medication or equipment? ***  Have you contacted your pharmacy to request a refill? Yes   Which pharmacy would you like this sent to? [id number] [place], [place] - [address] AT [facility] [address] [place] [address] Phone: [phone] Fax: [phone]   Patient notified that their request is being sent to the clinical staff for review and that they should receive a response within 2 business days.   Please advise at Mobile [phone] (mobile)

## 2023-02-15 ENCOUNTER — Telehealth: Payer: Self-pay

## 2023-02-15 ENCOUNTER — Telehealth: Payer: Self-pay | Admitting: Physician Assistant

## 2023-02-15 NOTE — Telephone Encounter (Signed)
Patient called and ask if she could get the Gel shots in both knees. CB#[phone]

## 2023-02-15 NOTE — Addendum Note (Signed)
Addended by: Laurey Arrow on: 02/15/2023 07:53 AM   Modules accepted: Level of Service

## 2023-02-15 NOTE — Telephone Encounter (Signed)
VOB submitted for Orthovisc, bilateral knee 

## 2023-02-20 NOTE — Progress Notes (Signed)
I have personally reviewed the Medicare Annual Wellness Visit and agree with the documentation.  Katina Degree. Jimmey Ralph, MD 02/20/2023 1:16 PM

## 2023-02-23 ENCOUNTER — Ambulatory Visit: Payer: Medicare Other | Admitting: Physician Assistant

## 2023-02-27 DIAGNOSIS — S29019A Strain of muscle and tendon of unspecified wall of thorax, initial encounter: Secondary | ICD-10-CM | POA: Diagnosis not present

## 2023-03-01 ENCOUNTER — Telehealth: Payer: Self-pay | Admitting: Family Medicine

## 2023-03-01 ENCOUNTER — Emergency Department (HOSPITAL_BASED_OUTPATIENT_CLINIC_OR_DEPARTMENT_OTHER): Payer: Medicare Other

## 2023-03-01 ENCOUNTER — Emergency Department (HOSPITAL_BASED_OUTPATIENT_CLINIC_OR_DEPARTMENT_OTHER)
Admission: EM | Admit: 2023-03-01 | Discharge: 2023-03-01 | Disposition: A | Discharge status: Home / Self Care | Payer: Medicare Other | Attending: Emergency Medicine | Admitting: Emergency Medicine

## 2023-03-01 ENCOUNTER — Encounter (HOSPITAL_BASED_OUTPATIENT_CLINIC_OR_DEPARTMENT_OTHER): Payer: Self-pay | Admitting: Emergency Medicine

## 2023-03-01 ENCOUNTER — Other Ambulatory Visit (HOSPITAL_BASED_OUTPATIENT_CLINIC_OR_DEPARTMENT_OTHER): Payer: Self-pay

## 2023-03-01 ENCOUNTER — Other Ambulatory Visit: Payer: Self-pay

## 2023-03-01 DIAGNOSIS — M79604 Pain in right leg: Secondary | ICD-10-CM | POA: Diagnosis not present

## 2023-03-01 DIAGNOSIS — Z79899 Other long term (current) drug therapy: Secondary | ICD-10-CM | POA: Insufficient documentation

## 2023-03-01 DIAGNOSIS — M79661 Pain in right lower leg: Secondary | ICD-10-CM | POA: Insufficient documentation

## 2023-03-01 DIAGNOSIS — Z856 Personal history of leukemia: Secondary | ICD-10-CM | POA: Diagnosis not present

## 2023-03-01 DIAGNOSIS — I10 Essential (primary) hypertension: Secondary | ICD-10-CM | POA: Diagnosis not present

## 2023-03-01 DIAGNOSIS — Z79890 Hormone replacement therapy (postmenopausal): Secondary | ICD-10-CM | POA: Diagnosis not present

## 2023-03-01 DIAGNOSIS — Z85828 Personal history of other malignant neoplasm of skin: Secondary | ICD-10-CM | POA: Diagnosis not present

## 2023-03-01 DIAGNOSIS — M1711 Unilateral primary osteoarthritis, right knee: Secondary | ICD-10-CM | POA: Diagnosis not present

## 2023-03-01 DIAGNOSIS — L905 Scar conditions and fibrosis of skin: Secondary | ICD-10-CM | POA: Diagnosis not present

## 2023-03-01 DIAGNOSIS — E039 Hypothyroidism, unspecified: Secondary | ICD-10-CM | POA: Insufficient documentation

## 2023-03-01 NOTE — ED Provider Notes (Signed)
[place] EMERGENCY DEPARTMENT AT [facility] Provider Note   CSN: [id number] Arrival date & time: 03/01/23  1435     History Chief Complaint  Patient presents with   Leg Pain    Rebecca [name] is a 89 y.o. female with h/o CLL, HTN, HLD, hypothyroidism presents to the ER for evaluation of pain a thte anterior portion of her right shin for the past week. The patient reports that she had a cancerous spot removed from that area in April 2024. She noticed the sore spot only when she crosses her legs to read in bed. No pain with ambulation. She denies any swelling to the area. Denies any fevers, chest pain, or SOB. Denies any trauma to the area or any falls. She does not appreciate any swelling to the area or leg. She was at the dermatologist office as she was concerned given that she was having pain just near the area where she had a cancer spot removed.  She reports that the dermatologist told her she had a blood clot.  No ultrasound was performed.     Leg Pain Associated symptoms: no fever        Home Medications Prior to Admission medications   Medication Sig Start Date End Date Taking? Authorizing Provider  acetaminophen (TYLENOL) 500 MG tablet Take 500-1,000 mg by mouth every 6 (six) hours as needed for mild pain or headache.    [provider]  ALPRAZolam (XANAX) 0.25 MG tablet TAKE 1 TABLET BY MOUTH AT BEDTIME AS NEEDED. DO NOT DRIVE FOR 8 HOURS AFTER TAKING AND STOP IF ANY FALLS UNTIL NEXT DOCTOR VISIT 02/01/23   Worthy Rancher B, FNP  atorvastatin (LIPITOR) 10 MG tablet Take 1 tablet (10 mg total) by mouth 3 (three) times a week. 10/05/22   Shelva Majestic, MD  Calcium Carb-Cholecalciferol (CALCIUM-VITAMIN D) 600-400 MG-UNIT TABS Take 1 tablet by mouth daily.    [provider]  Cholecalciferol (VITAMIN D3) 2000 UNITS TABS Take 1 tablet by mouth daily.    [provider]  cyclobenzaprine (FLEXERIL) 10 MG tablet Take 1 tablet (10 mg total)  by mouth at bedtime as needed for muscle spasms. 09/28/22   Willow Ora, MD  diclofenac sodium (VOLTAREN) 1 % GEL Apply 2 g topically 4 (four) times daily. Patient taking differently: Apply 2 g topically 4 (four) times daily as needed (for pain). 08/09/17   Burchette, Elberta Fortis, MD  esomeprazole (NEXIUM) 20 MG capsule Take 20 mg by mouth 2 (two) times daily before a meal.    [provider]  estradiol (ESTRACE) 0.1 MG/GM vaginal cream Place 0.5 g vaginally See admin instructions. Place 0.5 g vaginally two to three times a week 12/13/21   [provider]  levothyroxine (SYNTHROID) 88 MCG tablet TAKE 1 TABLET(88 MCG) BY MOUTH DAILY BEFORE BREAKFAST 11/14/22   Shelva Majestic, MD  loperamide (IMODIUM) 2 MG capsule Take 1 capsule (2 mg total) by mouth 4 (four) times daily as needed for diarrhea or loose stools. 03/16/22   Kommor, Madison, MD  losartan (COZAAR) 100 MG tablet TAKE 1 TABLET(100 MG) BY MOUTH DAILY 01/09/23   Shelva Majestic, MD  metoCLOPramide (REGLAN) 5 MG tablet TAKE 1 TABLET BY MOUTH TWICE DAILY WITH MEALS 11/14/22   Shelva Majestic, MD  nystatin-triamcinolone (MYCOLOG II) cream Apply 1 Application topically See admin instructions. Apply to the affected area 2 times a week as needed for irritation    [provider]  ondansetron (  ZOFRAN-ODT) 4 MG disintegrating tablet Take 1 tablet (4 mg total) by mouth every 8 (eight) hours as needed for nausea or vomiting. 03/16/22   Kommor, Madison, MD  timolol (TIMOPTIC) 0.5 % ophthalmic solution Place 1 drop into both eyes 2 (two) times daily. 09/08/20   [provider]  vitamin B-12 (CYANOCOBALAMIN) 500 MCG tablet Take 500 mcg by mouth every morning.    [provider]  vitamin C (ASCORBIC ACID) 500 MG tablet Take 500 mg by mouth daily.    [provider]  VYZULTA 0.024 % SOLN Place 1 drop into both eyes at bedtime.    [provider]  olmesartan (BENICAR) 20 MG tablet Take 20 mg by mouth  daily.    07/28/11  [provider]      Allergies    Levaquin [levofloxacin], Sulfamethoxazole-trimethoprim, Lactose intolerance (gi), Tramadol hcl, Augmentin [amoxicillin-pot clavulanate], Ciprofloxacin, Rosuvastatin, Zosyn [piperacillin sod-tazobactam so], Adhesive [tape], Codeine, and Pneumococcal vaccine polyvalent    Review of Systems   Review of Systems  Constitutional:  Negative for chills and fever.  Respiratory:  Negative for shortness of breath.   Cardiovascular:  Negative for chest pain, palpitations and leg swelling.  Musculoskeletal:  Positive for myalgias (right shin pain).    Physical Exam Updated Vital Signs BP (!) 154/83 (BP Location: Left Arm)   Pulse 81   Temp 98.7 F (37.1 C) (Oral)   Resp 18   Ht 5\' 2"  (1.575 m)   Wt 54.4 kg   SpO2 100%   BMI 21.95 kg/m  Physical Exam Vitals and nursing note reviewed.  Constitutional:      General: She is not in acute distress.    Appearance: She is not ill-appearing or toxic-appearing.  Eyes:     General: No scleral icterus. Cardiovascular:     Rate and Rhythm: Normal rate.  Pulmonary:     Effort: Pulmonary effort is normal. No respiratory distress.  Abdominal:     Tenderness: There is no abdominal tenderness. There is no guarding or rebound.  Musculoskeletal:     Right lower leg: No edema.     Left lower leg: No edema.       Legs:     Comments: Small point tenderness to the area marked above. There are no acute overlying skin changes noted.  There is no fluctuance, induration, overlying erythema or warmth.  There is no swelling.  It is approximately the size of a quarter of where she is feeling the tenderness.  She does not have any tenderness to her calf, hamstring, quadriceps, lower leg.  No pain with flexion extension of the ankle.  Brisk cap refill present in all 5 toes.  Palpable DP and PT pulses bilaterally that are symmetric.  Coloration and size appear symmetric.  Temperature feel symmetric  bilaterally as well.  Strength is symmetric in the bilateral lower extremities as well.  Compartments are soft throughout.  Sensation reportedly intact distally.  Skin:    General: Skin is warm and dry.  Neurological:     General: No focal deficit present.     Mental Status: She is alert.     Motor: No weakness.    ED Results / Procedures / Treatments   Labs (all labs ordered are listed, but only abnormal results are displayed) Labs Reviewed - No data to display  EKG None  Radiology [place] RT LOWER EXTREM LTD SOFT TISSUE NON VASCULAR  Result Date: 03/01/2023 CLINICAL DATA:  Right leg pain EXAM: ULTRASOUND right  LOWER EXTREMITY LIMITED TECHNIQUE: Ultrasound examination of the lower extremity soft tissues was performed in the area of clinical concern. COMPARISON:  Radiograph 03/01/2023 FINDINGS: Targeted sonography of the right lower leg in the region of clinical concern is performed. This is described as the anterior midline tibial area. No cyst or solid mass is seen. IMPRESSION: No sonographic correlate for pain in the anterior right lower leg, recommend clinical management. Electronically Signed   By: Jasmine Pang M.D.   On: 03/01/2023 16:27   DG Tibia/Fibula Right  Result Date: 03/01/2023 CLINICAL DATA:  Right lower leg pain EXAM: RIGHT TIBIA AND FIBULA - 2 VIEW COMPARISON:  08/10/2022 FINDINGS: There is no evidence of fracture or other focal bone lesions. Tricompartmental osteoarthritis of the right knee, most severe within the patellofemoral compartment. No malalignment at the knee or ankle. Soft tissues are unremarkable. IMPRESSION: 1. No acute findings. 2. Tricompartmental osteoarthritis of the right knee. Electronically Signed   By: Duanne Guess D.O.   On: 03/01/2023 16:08    Procedures Procedures   Medications Ordered in ED Medications - No data to display  ED Course/ Medical Decision Making/ A&P   Medical Decision Making Amount and/or Complexity of Data  Reviewed Radiology: ordered.   89 y.o. female presents to the ER for evaluation of lower leg pain. Differential diagnosis includes but is not limited to MSK, DVT, malignancy. Vital signs elevated BP, otherwise unremarkable. Physical exam as noted above.   US imaging shows no sonographic correlate for pain in the anterior right lower leg, recommend clinical management. . Per radiologist's interpretation.   XR of the tib/fib shows 1. No acute findings. 2. Tricompartmental osteoarthritis of the right knee.. Per radiologist's interpretation.    The patient is having point tenderness to one area and that is to the anterior mid/lower shin on the right. There are no overlying skin changes, I doubt any cellulitis. [place] was normal. XR showed arthritis and is otherwise unremarkable. This is less likely a DVT given the point tenderness over the bone, doesn't correlate with a vessel. My attending assessed the patient and recommends outpatient follow up with PCP. I think this is reasonable. Recommended that the patient keep an eye on the area incase there is a skin change or swelling. She is NV intact distally. Compartments are soft. Coloration and temperature appear and feel symmetric bilaterally. This is likely MSK in nature. Discharge home with PCP follow up.  We discussed the results of the labs/imaging. The plan is follow up with PCP, RICE. We discussed strict return precautions and red flag symptoms. The patient verbalized their understanding and agrees to the plan. The patient is stable and being discharged home in good condition.  Portions of this report may have been transcribed using voice recognition software. Every effort was made to ensure accuracy; however, inadvertent computerized transcription errors may be present.   Final Clinical Impression(s) / ED Diagnoses Final diagnoses:  Pain of right lower extremity    Rx / DC Orders ED Discharge Orders     None         Achille Rich,  PA-C 03/06/23 1806    Royanne Foots, DO 03/11/23 1101

## 2023-03-01 NOTE — Discharge Instructions (Addendum)
You were seen in the ER for evaluation of your leg pain.  Your x-ray shows that you have arthritis anterior knee.  Otherwise, your x-ray and ultrasound did not show any new or acute findings to explain his.  I am recommending you do the RICE method as included in information above.  Please review.  I recommend following up your primary care doctor next few days for reevaluation.  If the leg becomes swollen, red, increasing in pain, you develop any fever or new rash, chest pain, shortness of breath, please return to your nearest emergency room for evaluation. If you have any other concerns, new or worsening symptoms, please return to the nearest ER.   Contact a doctor if: You keep having pain and swelling. Your symptoms get worse. Get help right away if: You have sudden, very bad pain at your injury or lower than your injury. You have redness or more swelling around your injury. You have tingling or numbness at your injury or lower than your injury, and it does not go away when you take off the bandage.

## 2023-03-01 NOTE — ED Notes (Signed)
Pt did not want ace wrap. Dc instructions reviewed with patient. Patient voiced understanding. Dc with belongings.

## 2023-03-01 NOTE — Telephone Encounter (Signed)
Pt currently being seen in [facility]  Patient Name First: Rebecca Last: [name] Gender: Female DOB: [birth year] Age: 89 Y 9 D Return Phone Number: [phone] (Primary) Address: City/ State/ Zip: [place]  [address] Client [facility] - Human resources [facility] [employer] Provider Tana Conch- MD Contact Type Call Who Is Calling Patient / Member / Family / Caregiver Call Type Triage / Clinical Relationship To Patient Self Return Phone Number [phone] (Primary) Chief Complaint Leg Pain Reason for Call Symptomatic / Request for Health Information Initial Comment Caller states she had cancer removed from her leg and the area is sore, her other provider told her it was a blood clot. Front of her leg by the shin bone is sore, thinks she may needs an x ray. Translation No Nurse Assessment Nurse: Stefano Gaul, RN, Vera Date/Time (Eastern Time): 03/01/2023 12:42:59 PM Confirm and document reason for call. If symptomatic, describe symptoms. ---Caller states she had cancer removed from her leg in April. Saw dermatologist this am who said he thought she had a blood clot. has pain in the front of her right leg. No swelling. has had pain for 2 days. Does the patient have any new or worsening symptoms? ---Yes Will a triage be completed? ---Yes Related visit to physician within the last 2 weeks? ---Yes Does the PT have any chronic conditions? (i.e. diabetes, asthma, this includes High risk factors for pregnancy, etc.) ---Yes List chronic conditions. ---HTN Is this a behavioral health or substance abuse call? ---No Guidelines Guideline Title Affirmed Question Affirmed Notes Nurse Date/Time Lamount Cohen Time) Leg Pain [1] Thigh or calf pain AND [2] only 1 side AND [3] present > 1 hour (Exception: Stefano Gaul, RN, Vera 03/01/2023 12:46:05 PM Guidelines Guideline Title Affirmed Question Affirmed Notes Nurse Date/Time  (Eastern Time) Chronic unchanged pain.) Disp. Time Lamount Cohen Time) Disposition Final User 03/01/2023 12:51:32 PM See HCP within 4 Hours (or PCP triage) Yes Stefano Gaul, RN, Dwana Curd Final Disposition 03/01/2023 12:51:32 PM See HCP within 4 Hours (or PCP triage) Yes Stefano Gaul, RN, Clerance Lav Disagree/Comply Comply Caller Understands Yes PreDisposition Did not know what to do Care Advice Given Per Guideline SEE HCP (OR PCP TRIAGE) WITHIN 4 HOURS: * IF OFFICE WILL BE OPEN: You need to be seen within the next 3 or 4 hours. Call your doctor (or NP/PA) now or as soon as the office opens. CARE ADVICE given per Leg Pain (Adult) guideline. * You become worse CALL BACK IF: * Chest pain or shortness of breath occurs CALL 911 IF: Referrals GO TO FACILITY UNDECIDED

## 2023-03-01 NOTE — ED Triage Notes (Signed)
Was told by dermatologist that patient has blood clot in R lower leg. Had spot removed by dermatologist in April. Pain came back this week. Leg appears normal in color and cool to touch,. Does not appear swollen compared to other leg.

## 2023-03-06 ENCOUNTER — Encounter: Payer: Self-pay | Admitting: Family

## 2023-03-06 ENCOUNTER — Ambulatory Visit (INDEPENDENT_AMBULATORY_CARE_PROVIDER_SITE_OTHER): Payer: Medicare Other | Admitting: Family

## 2023-03-06 VITALS — BP 144/78 | HR 58 | Temp 97.5°F | Ht 62.0 in | Wt 120.8 lb

## 2023-03-06 DIAGNOSIS — W01119A Fall on same level from slipping, tripping and stumbling with subsequent striking against unspecified sharp object, initial encounter: Secondary | ICD-10-CM | POA: Diagnosis not present

## 2023-03-06 DIAGNOSIS — S50312A Abrasion of left elbow, initial encounter: Secondary | ICD-10-CM

## 2023-03-06 NOTE — Telephone Encounter (Signed)
Pt has ov today with [facility].

## 2023-03-06 NOTE — Patient Instructions (Addendum)
It was very nice to see you today!   Wash your elbow with soap and water daily, pat dry gently, apply non-adherent gauze and wrap with coban (or other self-sticking gauze wrap) to secure. Do this daily until the wound is dry (or no longer draining.  Any questions or concerns regarding healing, let us know!      PLEASE NOTE:  If you had any lab tests please let us know if you have not heard back within a few days. You may see your results on MyChart before we have a chance to review them but we will give you a call once they are reviewed by [place]. If we ordered any referrals today, please let us know if you have not heard from their office within the next week.

## 2023-03-06 NOTE — Progress Notes (Signed)
Patient ID: Rebecca [name], female    DOB: [birth year], 89 y.o.   MRN: [id number]  Chief Complaint  Patient presents with   Abrasion    Recent fall skin scraped on bdack of left arm near elbow    Discussed the use of AI scribe software for clinical note transcription with the patient, who gave verbal consent to proceed.  History of Present Illness   The patient presents today with her daughter after a fall resulting in an abrasion to the left elbow and pain in her right knee. She denies any other injuries from the fall. The right knee is 'real sore' but there is no broken skin. She is ambulating fine and has an upcoming orthopedic appointment with planned x-rays.  The patient's left elbow has a 'good little gash' that has been cleaned and dressed daily with antibiotic ointment by a family member. The wound is still painful and draining. The patient is here to confirm there are no signs of infection.          Assessment & Plan:     Fall with abrasion on left elbow -   Patient sustained a fall resulting in abrasion to her just above her left elbow on lateral side of arm, bruising to her right knee and bruising on left side ribs noted on exam, no tenderness with palpation.  No other injuries reported. On examination, the wound appears to be healing well with no signs of infection.   -Old wound dressing removed, shallow skin tear noted just proximal to elbow, approx. 5cm x 3cm in diameter w/bruising proximal to wound noted, cleansed with betadine, new non-adherent dressing applied & secured with coban wrap. -Advised pt and dtr. present to clean wound daily with soap and water, dry well, and re-dress.   -Continue this care until wound is no longer draining, at which point it can be left uncovered, or covered with light bandage if needed for protection against clothing. May take up to 7-10d for healing. -RTO precautions provided.  Right knee pain  - Patient reports soreness in the right knee  following a fall. No broken skin or other visible injuries. Patient is able to walk.   -Orthopedic follow-up already scheduled.     Subjective:    Outpatient Medications Prior to Visit  Medication Sig Dispense Refill   acetaminophen (TYLENOL) 500 MG tablet Take 500-1,000 mg by mouth every 6 (six) hours as needed for mild pain or headache.     ALPRAZolam (XANAX) 0.25 MG tablet TAKE 1 TABLET BY MOUTH AT BEDTIME AS NEEDED. DO NOT DRIVE FOR 8 HOURS AFTER TAKING AND STOP IF ANY FALLS UNTIL NEXT DOCTOR VISIT 20 tablet 0   atorvastatin (LIPITOR) 10 MG tablet Take 1 tablet (10 mg total) by mouth 3 (three) times a week. 39 tablet 3   Calcium Carb-Cholecalciferol (CALCIUM-VITAMIN D) 600-400 MG-UNIT TABS Take 1 tablet by mouth daily.     Cholecalciferol (VITAMIN D3) 2000 UNITS TABS Take 1 tablet by mouth daily.     cyclobenzaprine (FLEXERIL) 10 MG tablet Take 1 tablet (10 mg total) by mouth at bedtime as needed for muscle spasms. 30 tablet 0   diclofenac sodium (VOLTAREN) 1 % GEL Apply 2 g topically 4 (four) times daily. (Patient taking differently: Apply 2 g topically 4 (four) times daily as needed (for pain).) 1 Tube 1   esomeprazole (NEXIUM) 20 MG capsule Take 20 mg by mouth 2 (two) times daily before a meal.     estradiol (  ESTRACE) 0.1 MG/GM vaginal cream Place 0.5 g vaginally See admin instructions. Place 0.5 g vaginally two to three times a week     levothyroxine (SYNTHROID) 88 MCG tablet TAKE 1 TABLET(88 MCG) BY MOUTH DAILY BEFORE BREAKFAST 90 tablet 3   loperamide (IMODIUM) 2 MG capsule Take 1 capsule (2 mg total) by mouth 4 (four) times daily as needed for diarrhea or loose stools. 12 capsule 0   losartan (COZAAR) 100 MG tablet TAKE 1 TABLET(100 MG) BY MOUTH DAILY 90 tablet 3   metoCLOPramide (REGLAN) 5 MG tablet TAKE 1 TABLET BY MOUTH TWICE DAILY WITH MEALS 60 tablet 3   nystatin-triamcinolone (MYCOLOG II) cream Apply 1 Application topically See admin instructions. Apply to the affected area 2  times a week as needed for irritation     ondansetron (ZOFRAN-ODT) 4 MG disintegrating tablet Take 1 tablet (4 mg total) by mouth every 8 (eight) hours as needed for nausea or vomiting. 20 tablet 0   timolol (TIMOPTIC) 0.5 % ophthalmic solution Place 1 drop into both eyes 2 (two) times daily.     vitamin B-12 (CYANOCOBALAMIN) 500 MCG tablet Take 500 mcg by mouth every morning.     vitamin C (ASCORBIC ACID) 500 MG tablet Take 500 mg by mouth daily.     VYZULTA 0.024 % SOLN Place 1 drop into both eyes at bedtime.     No facility-administered medications prior to visit.   Past Medical History:  Diagnosis Date   Anemia    Anxiety    Bifascicular block    Diverticulitis    Diverticulosis    Gastroparesis    GERD (gastroesophageal reflux disease)    Glaucoma    Hemorrhoids    Hyperlipidemia    Hypertension    Hypertension    IBS (irritable bowel syndrome)    Rectal ulcer    SCOLIOSIS, LUMBAR SPINE 03/09/2010   Qualifier: Diagnosis of  By: Lovell Sheehan MD, John E    Segmental colitis [facility])    Tubular adenoma of colon 05/2009   Past Surgical History:  Procedure Laterality Date   APPENDECTOMY     ARCUATE KERATECTOMY     BILIARY DILATION  03/27/2021   Procedure: BILIARY DILATION;  Surgeon: Rachael Fee, MD;  Location: [facility];  Service: Gastroenterology;;   CATARACT EXTRACTION, BILATERAL  2015   CHOLECYSTECTOMY N/A 03/28/2021   Procedure: LAPAROSCOPIC CHOLECYSTECTOMY;  Surgeon: Romie Levee, MD;  Location: [facility];  Service: General;  Laterality: N/A;   double pallital tori bone removed      ERCP N/A 03/27/2021   Procedure: ENDOSCOPIC RETROGRADE CHOLANGIOPANCREATOGRAPHY (ERCP);  Surgeon: Rachael Fee, MD;  Location: [facility];  Service: Gastroenterology;  Laterality: N/A;   HERNIA REPAIR     umbilical   KNEE SURGERY Bilateral    morton's neuroma on rt foot and platar facial release     MOUTH SURGERY     "bone shaved off roof of mouth"   REMOVAL OF STONES  03/27/2021    Procedure: REMOVAL OF STONES;  Surgeon: Rachael Fee, MD;  Location: [facility];  Service: Gastroenterology;;   SALIVARY GLAND SURGERY     removal   SPHINCTEROTOMY  03/27/2021   Procedure: SPHINCTEROTOMY;  Surgeon: Rachael Fee, MD;  Location: [facility];  Service: Gastroenterology;;   TONSILLECTOMY     Allergies  Allergen Reactions   Levaquin [Levofloxacin] Other (See Comments)    Very nauseous and shakey   Sulfamethoxazole-Trimethoprim Nausea Only and Other (See Comments)    Shakiness,  also   Lactose Intolerance (Gi) Diarrhea and Other (See Comments)    Makes the "stomach hurt"   Tramadol Hcl Nausea Only and Palpitations   Augmentin [Amoxicillin-Pot Clavulanate] Diarrhea   Ciprofloxacin Nausea Only and Other (See Comments)    Thrush and Shakiness also   Rosuvastatin Other (See Comments)    Caused restless legs   Zosyn [Piperacillin Sod-Tazobactam So] Diarrhea and Other (See Comments)    Severe diarrhea   Adhesive [Tape] Rash   Codeine Nausea Only   Pneumococcal Vaccine Polyvalent Rash and Other (See Comments)    Caused a RED AND RAISED RASH ON ARM      Objective:  Physical Exam Vitals and nursing note reviewed.  HENT:     Head: Normocephalic.  Cardiovascular:     Rate and Rhythm: Normal rate and regular rhythm.  Pulmonary:     Effort: Pulmonary effort is normal.     Breath sounds: Normal breath sounds.  Musculoskeletal:     Cervical back: Normal range of motion.     Right knee: Swelling (mild) and bony tenderness present. No erythema. Normal range of motion.  Skin:    General: Skin is warm.     Findings: Abrasion (shallow skin tear w/mild bleeding noted just proximal to elbow, approx. 5cm x 3cm in diameter w/bruising proximal to wound noted) and bruising (2 small areas noted on left side, posterior, over rib cage) present.       Neurological:     Mental Status: She is alert and oriented to person, place, and time.     Motor: Weakness (using walker for  ambulating) present.  Psychiatric:        Mood and Affect: Mood normal.    BP (!) 144/78   Pulse (!) 58   Temp (!) 97.5 F (36.4 C)   Ht 5\' 2"  (1.575 m)   Wt 120 lb 12.8 oz (54.8 kg)   SpO2 98%   BMI 22.09 kg/m  Wt Readings from Last 3 Encounters:  03/06/23 120 lb 12.8 oz (54.8 kg)  03/01/23 120 lb (54.4 kg)  12/13/22 119 lb 3.2 oz (54.1 kg)     Dulce Sellar, NP

## 2023-03-08 ENCOUNTER — Other Ambulatory Visit (INDEPENDENT_AMBULATORY_CARE_PROVIDER_SITE_OTHER): Payer: Medicare Other

## 2023-03-08 ENCOUNTER — Encounter: Payer: Self-pay | Admitting: Physician Assistant

## 2023-03-08 ENCOUNTER — Ambulatory Visit: Payer: Medicare Other | Admitting: Physician Assistant

## 2023-03-08 ENCOUNTER — Telehealth: Payer: Self-pay | Admitting: Family Medicine

## 2023-03-08 ENCOUNTER — Other Ambulatory Visit: Payer: Self-pay

## 2023-03-08 DIAGNOSIS — M170 Bilateral primary osteoarthritis of knee: Secondary | ICD-10-CM

## 2023-03-08 DIAGNOSIS — M25561 Pain in right knee: Secondary | ICD-10-CM | POA: Diagnosis not present

## 2023-03-08 MED ORDER — HYALURONAN 30 MG/2ML IX SOSY
30.0000 mg | PREFILLED_SYRINGE | INTRA_ARTICULAR | Status: AC | PRN
  Administered 2023-03-08: 30 mg via INTRA_ARTICULAR

## 2023-03-08 NOTE — Progress Notes (Signed)
Office Visit Note   Patient: Rebecca [name]           Date of Birth: 08-14-1932           MRN: [id number] Visit Date: 03/08/2023              Requested by: Shelva Majestic, MD [address] [place],  [place] [address] PCP: Shelva Majestic, MD  Chief Complaint  Patient presents with  . Right Knee - Pain  . Left Knee - Pain      HPI: Rebecca [name] is a pleasant 89 year old woman with a history of advanced arthritis of both of her knees.  She periodically comes in for injections into her knees.  She did have a fall before Thanksgiving onto her right knee.  She denies any swelling or any bruising however she is concerned she will be able to get her injection today does not really have any pain today  Assessment & Plan: Visit Diagnoses: Osteoarthritis bilateral knees  Plan: Micah Flesher forward with injections today will follow-up in 1 week  Follow-Up Instructions: No follow-ups on file.   Ortho Exam  Patient is alert, oriented, no adenopathy, well-dressed, normal affect, normal respiratory effort. Bilateral knees no effusions no erythema compartments are soft and compressible she has good extension of the the right and left knee  Imaging: No results found. No images are attached to the encounter.  Labs: Lab Results  Component Value Date   HGBA1C 5.9 10/04/2022   HGBA1C 5.6 07/26/2006   ESRSEDRATE 22 02/10/2021   REPTSTATUS 03/17/2022 FINAL 03/16/2022   CULT (A) 03/16/2022    <10,000 COLONIES/mL INSIGNIFICANT GROWTH Performed at [facility], [address] [place], [place] [address]    LABORGA NO GROWTH 06/10/2016     Lab Results  Component Value Date   ALBUMIN 4.1 12/13/2022   ALBUMIN 4.1 10/04/2022   ALBUMIN 4.2 06/14/2022    Lab Results  Component Value Date   MG 1.8 03/28/2021   Lab Results  Component Value Date   VD25OH 42.96 04/05/2022   VD25OH 45 03/20/2020   VD25OH 47.49 01/01/2016    No results found for: "PREALBUMIN"    Latest Ref Rng  & Units 12/13/2022   12:51 PM 10/04/2022    9:33 AM 06/14/2022   12:40 PM  CBC EXTENDED  WBC 4.0 - 10.5 K/uL 19.4  29.3 Repeated and verified X2.  24.2   RBC 3.87 - 5.11 MIL/uL 3.40  3.69  3.64   Hemoglobin 12.0 - 15.0 g/dL 82.9  56.2  13.0   HCT 36.0 - 46.0 % 33.6  37.5  36.6   Platelets 150 - 400 K/uL 121  193.0  190   NEUT# 1.7 - 7.7 K/uL 1.6  3.4  3.8   Lymph# 0.7 - 4.0 K/uL 17.3  24.9  16.4      There is no height or weight on file to calculate BMI.  Orders:  No orders of the defined types were placed in this encounter.  No orders of the defined types were placed in this encounter.    Procedures: Large Joint Inj: bilateral knee on 03/08/2023 2:18 PM Indications: pain and diagnostic evaluation Details: 22 G 1.5 in needle, anteromedial approach  Arthrogram: No  Medications (Right): 30 mg Hyaluronan 30 MG/2ML Medications (Left): 30 mg Hyaluronan 30 MG/2ML Outcome: tolerated well, no immediate complications Procedure, treatment alternatives, risks and benefits explained, specific risks discussed. Consent was given by the patient.    Clinical  Data: No additional findings.  ROS:  All other systems negative, except as noted in the HPI. Review of Systems  Objective: Vital Signs: There were no vitals taken for this visit.  Specialty Comments:  No specialty comments available.  PMFS History: Patient Active Problem List   Diagnosis Date Noted  . B12 deficiency 10/04/2022  . Myalgia 04/05/2022  . CLL (chronic lymphocytic leukemia) (HCC) 09/30/2021  . Cholangitis 03/26/2021  . Cholelithiasis 03/25/2021  . Elevated LFTs 03/25/2021  . Chronic hyponatremia 03/25/2021  . Bilateral primary osteoarthritis of knee 06/11/2019  . Pain in left shoulder 05/22/2019  . Vitamin D deficiency 12/25/2013  . SPINAL STENOSIS, LUMBAR 03/09/2010  . Osteoporosis 03/09/2010  . MIGRAINE, OPHTHALMIC 10/29/2009  . ADENOMATOUS COLONIC POLYP 06/10/2009  . History of iron deficiency  05/06/2009  . Carotid artery disease (HCC) 07/28/2008  . Allergic rhinitis 03/20/2008  . DYSPHAGIA 03/12/2008  . Ganglion of tendon sheath 08/17/2007  . Hypothyroidism 11/15/2006  . Hyperlipidemia 11/07/2006  . Essential hypertension 10/18/2006  . GERD 10/18/2006  . DIVERTICULOSIS, COLON 10/18/2006   Past Medical History:  Diagnosis Date  . Anemia   . Anxiety   . Bifascicular block   . Diverticulitis   . Diverticulosis   . Gastroparesis   . GERD (gastroesophageal reflux disease)   . Glaucoma   . Hemorrhoids   . Hyperlipidemia   . Hypertension   . Hypertension   . IBS (irritable bowel syndrome)   . Rectal ulcer   . SCOLIOSIS, LUMBAR SPINE 03/09/2010   Qualifier: Diagnosis of  By: Lovell Sheehan MD, Balinda Quails   . Segmental colitis (HCC)   . Tubular adenoma of colon 05/2009    Family History  Problem Relation Age of Onset  . Diabetes Mother   . Heart disease Mother   . Hyperlipidemia Mother   . Hypertension Mother   . Anuerysm Father        abdominal- heavy smoker  . Heart disease Father   . Hypertension Father   . Colon polyps Sister   . Lung cancer Sister        smoker  . Colon polyps Brother   . Diabetes Brother   . Hyperlipidemia Brother   . Hypertension Brother   . Heart disease Brother   . Colon cancer Paternal Grandmother        with possible stomach cancer  . Migraines Daughter   . Kidney Stones Daughter   . Colon polyps Daughter   . Breast cancer Daughter   . Cancer Daughter        salivary  . Colon cancer Maternal Aunt        Rectal cancer  . Crohn's disease Neg Hx   . Pancreatic cancer Neg Hx     Past Surgical History:  Procedure Laterality Date  . APPENDECTOMY    . ARCUATE KERATECTOMY    . BILIARY DILATION  03/27/2021   Procedure: BILIARY DILATION;  Surgeon: Rachael Fee, MD;  Location: [facility];  Service: Gastroenterology;;  . CATARACT EXTRACTION, BILATERAL  2015  . CHOLECYSTECTOMY N/A 03/28/2021   Procedure: LAPAROSCOPIC CHOLECYSTECTOMY;   Surgeon: Romie Levee, MD;  Location: [facility];  Service: General;  Laterality: N/A;  . double pallital tori bone removed     . ERCP N/A 03/27/2021   Procedure: ENDOSCOPIC RETROGRADE CHOLANGIOPANCREATOGRAPHY (ERCP);  Surgeon: Rachael Fee, MD;  Location: [facility];  Service: Gastroenterology;  Laterality: N/A;  . HERNIA REPAIR     umbilical  . KNEE SURGERY Bilateral   .  morton's neuroma on rt foot and platar facial release    . MOUTH SURGERY     "bone shaved off roof of mouth"  . REMOVAL OF STONES  03/27/2021   Procedure: REMOVAL OF STONES;  Surgeon: Rachael Fee, MD;  Location: [facility];  Service: Gastroenterology;;  . SALIVARY GLAND SURGERY     removal  . SPHINCTEROTOMY  03/27/2021   Procedure: Dennison Mascot;  Surgeon: Rachael Fee, MD;  Location: [facility];  Service: Gastroenterology;;  . TONSILLECTOMY     Social History   Occupational History  . Occupation: Print production planner    Comment: retired  Tobacco Use  . Smoking status: Never  . Smokeless tobacco: Never  Vaping Use  . Vaping status: Never Used  Substance and Sexual Activity  . Alcohol use: Yes    Alcohol/week: 0.0 standard drinks of alcohol    Comment: rare  . Drug use: No  . Sexual activity: Not Currently

## 2023-03-08 NOTE — Telephone Encounter (Signed)
Spoke with pt and she stated that yesterday she thinks that her daughter put on her bandage to tight and when she took it off it started to bleed.  She has now bandaged it back up and she also put ointment on it.  She wanted to know if she needed to continue to do as she been doing.   Advised to continue to do as she was told at visit.  Hopefully the dressing should be enough to hold pressure to stop bleeding.  Advised to make sure she is not scrubbing just pat lightly, no ointment and to follow up tomorrow.  Advised to go to urgent care if bleeding continues.

## 2023-03-08 NOTE — Telephone Encounter (Signed)
Pt would like a call back concerning her wound. Please advise.

## 2023-03-10 DIAGNOSIS — H401113 Primary open-angle glaucoma, right eye, severe stage: Secondary | ICD-10-CM | POA: Diagnosis not present

## 2023-03-10 DIAGNOSIS — H401122 Primary open-angle glaucoma, left eye, moderate stage: Secondary | ICD-10-CM | POA: Diagnosis not present

## 2023-03-13 ENCOUNTER — Other Ambulatory Visit: Payer: Self-pay | Admitting: Family

## 2023-03-15 ENCOUNTER — Ambulatory Visit (INDEPENDENT_AMBULATORY_CARE_PROVIDER_SITE_OTHER): Payer: Medicare Other | Admitting: Physician Assistant

## 2023-03-15 ENCOUNTER — Encounter: Payer: Self-pay | Admitting: Physician Assistant

## 2023-03-15 DIAGNOSIS — M1711 Unilateral primary osteoarthritis, right knee: Secondary | ICD-10-CM | POA: Diagnosis not present

## 2023-03-15 DIAGNOSIS — M1712 Unilateral primary osteoarthritis, left knee: Secondary | ICD-10-CM | POA: Diagnosis not present

## 2023-03-15 MED ORDER — HYALURONAN 30 MG/2ML IX SOSY
30.0000 mg | PREFILLED_SYRINGE | INTRA_ARTICULAR | Status: AC | PRN
  Administered 2023-03-15: 30 mg via INTRA_ARTICULAR

## 2023-03-15 NOTE — Progress Notes (Signed)
Office Visit Note   Patient: Rebecca [name]           Date of Birth: [birth year]           MRN: [id number] Visit Date: 03/15/2023              Requested by: Shelva Majestic, MD [address] [place],  [place] [address] PCP: Shelva Majestic, MD  Chief Complaint  Patient presents with  . Right Knee - Pain  . Left Knee - Pain      HPI: Rebecca [name] is an 89 year old woman with a history of osteoarthritis of both of her knees.  She comes in today for her second Orthovisc injections into her knees tolerated the first set well  Assessment & Plan: Visit Diagnoses: Osteoarthritis bilateral knees  Plan: Injected without difficulty she will follow-up in 1 week for her final injections  Follow-Up Instructions: 1 week for final injections  Ortho Exam  Patient is alert, oriented, no adenopathy, well-dressed, normal affect, normal respiratory effort. Bilateral knees she has no effusion erythema no swelling compartments are soft and compressible she is neurovascularly intact  Imaging: No results found. No images are attached to the encounter.  Labs: Lab Results  Component Value Date   HGBA1C 5.9 10/04/2022   HGBA1C 5.6 07/26/2006   ESRSEDRATE 22 02/10/2021   REPTSTATUS 03/17/2022 FINAL 03/16/2022   CULT (A) 03/16/2022    <10,000 COLONIES/mL INSIGNIFICANT GROWTH Performed at [facility], [address], [place], [place] [address]    LABORGA NO GROWTH 06/10/2016     Lab Results  Component Value Date   ALBUMIN 4.1 12/13/2022   ALBUMIN 4.1 10/04/2022   ALBUMIN 4.2 06/14/2022    Lab Results  Component Value Date   MG 1.8 03/28/2021   Lab Results  Component Value Date   VD25OH 42.96 04/05/2022   VD25OH 45 03/20/2020   VD25OH 47.49 01/01/2016    No results found for: "PREALBUMIN"    Latest Ref Rng & Units 12/13/2022   12:51 PM 10/04/2022    9:33 AM 06/14/2022   12:40 PM  CBC EXTENDED  WBC 4.0 - 10.5 K/uL 19.4  29.3 Repeated and verified X2.  24.2   RBC  3.87 - 5.11 MIL/uL 3.40  3.69  3.64   Hemoglobin 12.0 - 15.0 g/dL 27.0  62.3  76.2   HCT 36.0 - 46.0 % 33.6  37.5  36.6   Platelets 150 - 400 K/uL 121  193.0  190   NEUT# 1.7 - 7.7 K/uL 1.6  3.4  3.8   Lymph# 0.7 - 4.0 K/uL 17.3  24.9  16.4      There is no height or weight on file to calculate BMI.  Orders:  No orders of the defined types were placed in this encounter.  No orders of the defined types were placed in this encounter.    Procedures: Large Joint Inj: bilateral knee on 03/15/2023 1:30 PM Indications: pain and diagnostic evaluation Details: 1.5 in anteromedial approach  Arthrogram: No  Medications (Right): 30 mg Hyaluronan 30 MG/2ML Medications (Left): 30 mg Hyaluronan 30 MG/2ML Outcome: tolerated well, no immediate complications Procedure, treatment alternatives, risks and benefits explained, specific risks discussed. Consent was given by the patient.    Clinical Data: No additional findings.  ROS:  All other systems negative, except as noted in the HPI. Review of Systems  Objective: Vital Signs: There were no vitals taken for this visit.  Specialty Comments:  No specialty  comments available.  PMFS History: Patient Active Problem List   Diagnosis Date Noted  . B12 deficiency 10/04/2022  . Myalgia 04/05/2022  . CLL (chronic lymphocytic leukemia) (HCC) 09/30/2021  . Cholangitis 03/26/2021  . Cholelithiasis 03/25/2021  . Elevated LFTs 03/25/2021  . Chronic hyponatremia 03/25/2021  . Bilateral primary osteoarthritis of knee 06/11/2019  . Pain in left shoulder 05/22/2019  . Vitamin D deficiency 12/25/2013  . SPINAL STENOSIS, LUMBAR 03/09/2010  . Osteoporosis 03/09/2010  . MIGRAINE, OPHTHALMIC 10/29/2009  . ADENOMATOUS COLONIC POLYP 06/10/2009  . History of iron deficiency 05/06/2009  . Carotid artery disease (HCC) 07/28/2008  . Allergic rhinitis 03/20/2008  . DYSPHAGIA 03/12/2008  . Ganglion of tendon sheath 08/17/2007  . Hypothyroidism  11/15/2006  . Hyperlipidemia 11/07/2006  . Essential hypertension 10/18/2006  . GERD 10/18/2006  . DIVERTICULOSIS, COLON 10/18/2006   Past Medical History:  Diagnosis Date  . Anemia   . Anxiety   . Bifascicular block   . Diverticulitis   . Diverticulosis   . Gastroparesis   . GERD (gastroesophageal reflux disease)   . Glaucoma   . Hemorrhoids   . Hyperlipidemia   . Hypertension   . Hypertension   . IBS (irritable bowel syndrome)   . Rectal ulcer   . SCOLIOSIS, LUMBAR SPINE 03/09/2010   Qualifier: Diagnosis of  By: Lovell Sheehan MD, Balinda Quails   . Segmental colitis (HCC)   . Tubular adenoma of colon 05/2009    Family History  Problem Relation Age of Onset  . Diabetes Mother   . Heart disease Mother   . Hyperlipidemia Mother   . Hypertension Mother   . Anuerysm Father        abdominal- heavy smoker  . Heart disease Father   . Hypertension Father   . Colon polyps Sister   . Lung cancer Sister        smoker  . Colon polyps Brother   . Diabetes Brother   . Hyperlipidemia Brother   . Hypertension Brother   . Heart disease Brother   . Colon cancer Paternal Grandmother        with possible stomach cancer  . Migraines Daughter   . Kidney Stones Daughter   . Colon polyps Daughter   . Breast cancer Daughter   . Cancer Daughter        salivary  . Colon cancer Maternal Aunt        Rectal cancer  . Crohn's disease Neg Hx   . Pancreatic cancer Neg Hx     Past Surgical History:  Procedure Laterality Date  . APPENDECTOMY    . ARCUATE KERATECTOMY    . BILIARY DILATION  03/27/2021   Procedure: BILIARY DILATION;  Surgeon: Rachael Fee, MD;  Location: [facility];  Service: Gastroenterology;;  . CATARACT EXTRACTION, BILATERAL  2015  . CHOLECYSTECTOMY N/A 03/28/2021   Procedure: LAPAROSCOPIC CHOLECYSTECTOMY;  Surgeon: Romie Levee, MD;  Location: [facility];  Service: General;  Laterality: N/A;  . double pallital tori bone removed     . ERCP N/A 03/27/2021   Procedure:  ENDOSCOPIC RETROGRADE CHOLANGIOPANCREATOGRAPHY (ERCP);  Surgeon: Rachael Fee, MD;  Location: [facility];  Service: Gastroenterology;  Laterality: N/A;  . HERNIA REPAIR     umbilical  . KNEE SURGERY Bilateral   . morton's neuroma on rt foot and platar facial release    . MOUTH SURGERY     "bone shaved off roof of mouth"  . REMOVAL OF STONES  03/27/2021   Procedure: REMOVAL  OF STONES;  Surgeon: Rachael Fee, MD;  Location: [facility];  Service: Gastroenterology;;  . SALIVARY GLAND SURGERY     removal  . SPHINCTEROTOMY  03/27/2021   Procedure: Dennison Mascot;  Surgeon: Rachael Fee, MD;  Location: [facility];  Service: Gastroenterology;;  . TONSILLECTOMY     Social History   Occupational History  . Occupation: Print production planner    Comment: retired  Tobacco Use  . Smoking status: Never  . Smokeless tobacco: Never  Vaping Use  . Vaping status: Never Used  Substance and Sexual Activity  . Alcohol use: Yes    Alcohol/week: 0.0 standard drinks of alcohol    Comment: rare  . Drug use: No  . Sexual activity: Not Currently

## 2023-03-16 NOTE — Telephone Encounter (Signed)
Pt is still waiting on a call back concerning the RX below

## 2023-03-20 ENCOUNTER — Encounter: Payer: Self-pay | Admitting: Family Medicine

## 2023-03-20 NOTE — Telephone Encounter (Signed)
Pt saw [place] last week, see below.

## 2023-03-21 NOTE — Telephone Encounter (Signed)
Pt called and would like a call back. Declined to come in, she would just like a call back concerning her injury. Please advise.

## 2023-03-21 NOTE — Telephone Encounter (Signed)
Patient called stating that she isn't in need of being called back by anyone.

## 2023-03-21 NOTE — Telephone Encounter (Signed)
Called pt back and phone just rang, was not able to leave vm. When pt returns call please get detailed message of her concerns, I will send another mychart also.

## 2023-03-21 NOTE — Telephone Encounter (Signed)
Patient returned call. Requests to be called. Declined to give details. Stated that she does not think an OV is necessary.

## 2023-03-22 ENCOUNTER — Ambulatory Visit (INDEPENDENT_AMBULATORY_CARE_PROVIDER_SITE_OTHER): Payer: Medicare Other | Admitting: Physician Assistant

## 2023-03-22 ENCOUNTER — Encounter: Payer: Self-pay | Admitting: Physician Assistant

## 2023-03-22 DIAGNOSIS — M1712 Unilateral primary osteoarthritis, left knee: Secondary | ICD-10-CM

## 2023-03-22 DIAGNOSIS — M1711 Unilateral primary osteoarthritis, right knee: Secondary | ICD-10-CM

## 2023-03-22 DIAGNOSIS — M170 Bilateral primary osteoarthritis of knee: Secondary | ICD-10-CM | POA: Diagnosis not present

## 2023-03-22 MED ORDER — HYALURONAN 30 MG/2ML IX SOSY
30.0000 mg | PREFILLED_SYRINGE | INTRA_ARTICULAR | Status: AC | PRN
  Administered 2023-03-22: 30 mg via INTRA_ARTICULAR

## 2023-03-22 NOTE — Progress Notes (Signed)
Office Visit Note   Patient: Rebecca [name]           Date of Birth: 05-Feb-1933           MRN: [id number] Visit Date: 03/22/2023              Requested by: Shelva Majestic, MD [address] [place],  [place] [address] PCP: Shelva Majestic, MD  Chief Complaint  Patient presents with  . Right Knee - Pain  . Left Knee - Pain      HPI: Mairely presents today for follow-up on her knees she is here for final Orthovisc injection to her bilateral knees.  She is feeling like she is getting some relief in the right not as much on the left but she also had a fall on the left.  She is also asking to have an abrasion she sustained taken a look at on her left upper arm just above her elbow.  She just wants to make sure is healing she still getting some bleeding from it   Assessment & Plan: Visit Diagnoses: Osteoarthritis bilateral knees Left knee abrasion  Plan: Went forward with Orthovisc without any difficulty.  I did debride some necrotic skin just above her elbow.  There was no cellulitis or evidence of infection she did have a small open area beneath.  I stopped the bleeding with a little silver nitrate and applied a wound sock.  I think she will have difficulty changing this but at least if she can keep it on for the next 24 hours I think it would be helpful.  I asked her to get some Dial soap and wash the area daily with Dial soap and pat dry.  I do not think she needs a dressing unless she has bleeding.  If she wants she should wash the wound sock I gave her and see if she can have a family member help her apply it do not have any concerns for infection right now  Follow-Up Instructions: No follow-ups on file.   Ortho Exam  Patient is alert, oriented, no adenopathy, well-dressed, normal affect, normal respiratory effort. Left upper arm she has an abrasion most of which has resolved she has 1 small area of necrotic skin overlying a superficial one by one wound.  No surrounding  cellulitis no purulence bilateral knees no effusion no erythema  Imaging: No results found. No images are attached to the encounter.  Labs: Lab Results  Component Value Date   HGBA1C 5.9 10/04/2022   HGBA1C 5.6 07/26/2006   ESRSEDRATE 22 02/10/2021   REPTSTATUS 03/17/2022 FINAL 03/16/2022   CULT (A) 03/16/2022    <10,000 COLONIES/mL INSIGNIFICANT GROWTH Performed at [facility], [address], [place], [place] [address]    LABORGA NO GROWTH 06/10/2016     Lab Results  Component Value Date   ALBUMIN 4.1 12/13/2022   ALBUMIN 4.1 10/04/2022   ALBUMIN 4.2 06/14/2022    Lab Results  Component Value Date   MG 1.8 03/28/2021   Lab Results  Component Value Date   VD25OH 42.96 04/05/2022   VD25OH 45 03/20/2020   VD25OH 47.49 01/01/2016    No results found for: "PREALBUMIN"    Latest Ref Rng & Units 12/13/2022   12:51 PM 10/04/2022    9:33 AM 06/14/2022   12:40 PM  CBC EXTENDED  WBC 4.0 - 10.5 K/uL 19.4  29.3 Repeated and verified X2.  24.2   RBC 3.87 - 5.11  MIL/uL 3.40  3.69  3.64   Hemoglobin 12.0 - 15.0 g/dL 54.0  98.1  19.1   HCT 36.0 - 46.0 % 33.6  37.5  36.6   Platelets 150 - 400 K/uL 121  193.0  190   NEUT# 1.7 - 7.7 K/uL 1.6  3.4  3.8   Lymph# 0.7 - 4.0 K/uL 17.3  24.9  16.4      There is no height or weight on file to calculate BMI.  Orders:  No orders of the defined types were placed in this encounter.  No orders of the defined types were placed in this encounter.    Procedures: Large Joint Inj: bilateral knee on 03/22/2023 1:57 PM Indications: pain and diagnostic evaluation Details: 22 G 1.5 in needle, anteromedial approach  Arthrogram: No  Medications (Right): 30 mg Hyaluronan 30 MG/2ML Medications (Left): 30 mg Hyaluronan 30 MG/2ML Outcome: tolerated well, no immediate complications Procedure, treatment alternatives, risks and benefits explained, specific risks discussed. Consent was given by the patient.    Clinical Data: No  additional findings.  ROS:  All other systems negative, except as noted in the HPI. Review of Systems  Objective: Vital Signs: There were no vitals taken for this visit.  Specialty Comments:  No specialty comments available.  PMFS History: Patient Active Problem List   Diagnosis Date Noted  . B12 deficiency 10/04/2022  . Myalgia 04/05/2022  . CLL (chronic lymphocytic leukemia) (HCC) 09/30/2021  . Cholangitis 03/26/2021  . Cholelithiasis 03/25/2021  . Elevated LFTs 03/25/2021  . Chronic hyponatremia 03/25/2021  . Bilateral primary osteoarthritis of knee 06/11/2019  . Pain in left shoulder 05/22/2019  . Vitamin D deficiency 12/25/2013  . SPINAL STENOSIS, LUMBAR 03/09/2010  . Osteoporosis 03/09/2010  . MIGRAINE, OPHTHALMIC 10/29/2009  . ADENOMATOUS COLONIC POLYP 06/10/2009  . History of iron deficiency 05/06/2009  . Carotid artery disease (HCC) 07/28/2008  . Allergic rhinitis 03/20/2008  . DYSPHAGIA 03/12/2008  . Ganglion of tendon sheath 08/17/2007  . Hypothyroidism 11/15/2006  . Hyperlipidemia 11/07/2006  . Essential hypertension 10/18/2006  . GERD 10/18/2006  . DIVERTICULOSIS, COLON 10/18/2006   Past Medical History:  Diagnosis Date  . Anemia   . Anxiety   . Bifascicular block   . Diverticulitis   . Diverticulosis   . Gastroparesis   . GERD (gastroesophageal reflux disease)   . Glaucoma   . Hemorrhoids   . Hyperlipidemia   . Hypertension   . Hypertension   . IBS (irritable bowel syndrome)   . Rectal ulcer   . SCOLIOSIS, LUMBAR SPINE 03/09/2010   Qualifier: Diagnosis of  By: Lovell Sheehan MD, Balinda Quails   . Segmental colitis (HCC)   . Tubular adenoma of colon 05/2009    Family History  Problem Relation Age of Onset  . Diabetes Mother   . Heart disease Mother   . Hyperlipidemia Mother   . Hypertension Mother   . Anuerysm Father        abdominal- heavy smoker  . Heart disease Father   . Hypertension Father   . Colon polyps Sister   . Lung cancer Sister         smoker  . Colon polyps Brother   . Diabetes Brother   . Hyperlipidemia Brother   . Hypertension Brother   . Heart disease Brother   . Colon cancer Paternal Grandmother        with possible stomach cancer  . Migraines Daughter   . Kidney Stones Daughter   . Colon polyps Daughter   .  Breast cancer Daughter   . Cancer Daughter        salivary  . Colon cancer Maternal Aunt        Rectal cancer  . Crohn's disease Neg Hx   . Pancreatic cancer Neg Hx     Past Surgical History:  Procedure Laterality Date  . APPENDECTOMY    . ARCUATE KERATECTOMY    . BILIARY DILATION  03/27/2021   Procedure: BILIARY DILATION;  Surgeon: Rachael Fee, MD;  Location: [facility];  Service: Gastroenterology;;  . CATARACT EXTRACTION, BILATERAL  2015  . CHOLECYSTECTOMY N/A 03/28/2021   Procedure: LAPAROSCOPIC CHOLECYSTECTOMY;  Surgeon: Romie Levee, MD;  Location: [facility];  Service: General;  Laterality: N/A;  . double pallital tori bone removed     . ERCP N/A 03/27/2021   Procedure: ENDOSCOPIC RETROGRADE CHOLANGIOPANCREATOGRAPHY (ERCP);  Surgeon: Rachael Fee, MD;  Location: [facility];  Service: Gastroenterology;  Laterality: N/A;  . HERNIA REPAIR     umbilical  . KNEE SURGERY Bilateral   . morton's neuroma on rt foot and platar facial release    . MOUTH SURGERY     "bone shaved off roof of mouth"  . REMOVAL OF STONES  03/27/2021   Procedure: REMOVAL OF STONES;  Surgeon: Rachael Fee, MD;  Location: [facility];  Service: Gastroenterology;;  . SALIVARY GLAND SURGERY     removal  . SPHINCTEROTOMY  03/27/2021   Procedure: Dennison Mascot;  Surgeon: Rachael Fee, MD;  Location: [facility];  Service: Gastroenterology;;  . TONSILLECTOMY     Social History   Occupational History  . Occupation: Print production planner    Comment: retired  Tobacco Use  . Smoking status: Never  . Smokeless tobacco: Never  Vaping Use  . Vaping status: Never Used  Substance and Sexual Activity  .  Alcohol use: Yes    Alcohol/week: 0.0 standard drinks of alcohol    Comment: rare  . Drug use: No  . Sexual activity: Not Currently

## 2023-03-23 ENCOUNTER — Telehealth: Payer: Self-pay | Admitting: Physician Assistant

## 2023-03-23 NOTE — Telephone Encounter (Signed)
Pt states she had an appt yesterday with PA Persons and has additional medical questions. Please call pt at [phone].

## 2023-03-27 ENCOUNTER — Ambulatory Visit: Payer: Medicare Other | Admitting: Physician Assistant

## 2023-03-27 ENCOUNTER — Encounter: Payer: Self-pay | Admitting: Physician Assistant

## 2023-03-27 DIAGNOSIS — S40812S Abrasion of left upper arm, sequela: Secondary | ICD-10-CM | POA: Diagnosis not present

## 2023-03-27 NOTE — Progress Notes (Signed)
Office Visit Note   Patient: Rebecca [name]           Date of Birth: [birth year]           MRN: [id number] Visit Date: 03/27/2023              Requested by: Shelva Majestic, MD [address] [place],  [place] [address] PCP: Shelva Majestic, MD  Chief Complaint  Patient presents with   Left Elbow - Follow-up      HPI: Chynah comes in for a recheck today for an abrasion on her left elbow that she sustained in a fall.  I debrided some necrotic skin office last week.  She did try to use a stocking however it caused more bleeding because she is chronically anticoagulated.  Since then she has been washing it well using antibiotic and applying a Band-Aid.  Other than some stinging she feels it is doing much better  Assessment & Plan: Visit Diagnoses: Abrasion left elbow  Plan: Doing much better may follow-up as needed  Follow-Up Instructions: Return if symptoms worsen or fail to improve.  Ortho Exam  Patient is alert, oriented, no adenopathy, well-dressed, normal affect, normal respiratory effort. This looks much better than I have seen in the past she may follow-up as needed continue applying a Band-Aid till is completely dry She has no erythema on the arm small abrasion is healing nicely no minimal drainage.  Compartments are soft and compressible no cellulitis Imaging: No results found. No images are attached to the encounter.  Labs: Lab Results  Component Value Date   HGBA1C 5.9 10/04/2022   HGBA1C 5.6 07/26/2006   ESRSEDRATE 22 02/10/2021   REPTSTATUS 03/17/2022 FINAL 03/16/2022   CULT (A) 03/16/2022    <10,000 COLONIES/mL INSIGNIFICANT GROWTH Performed at [facility], [address], [place], [place] [address]    LABORGA NO GROWTH 06/10/2016     Lab Results  Component Value Date   ALBUMIN 4.1 12/13/2022   ALBUMIN 4.1 10/04/2022   ALBUMIN 4.2 06/14/2022    Lab Results  Component Value Date   MG 1.8 03/28/2021   Lab Results  Component  Value Date   VD25OH 42.96 04/05/2022   VD25OH 45 03/20/2020   VD25OH 47.49 01/01/2016    No results found for: "PREALBUMIN"    Latest Ref Rng & Units 12/13/2022   12:51 PM 10/04/2022    9:33 AM 06/14/2022   12:40 PM  CBC EXTENDED  WBC 4.0 - 10.5 K/uL 19.4  29.3 Repeated and verified X2.  24.2   RBC 3.87 - 5.11 MIL/uL 3.40  3.69  3.64   Hemoglobin 12.0 - 15.0 g/dL 65.7  84.6  96.2   HCT 36.0 - 46.0 % 33.6  37.5  36.6   Platelets 150 - 400 K/uL 121  193.0  190   NEUT# 1.7 - 7.7 K/uL 1.6  3.4  3.8   Lymph# 0.7 - 4.0 K/uL 17.3  24.9  16.4      There is no height or weight on file to calculate BMI.  Orders:  No orders of the defined types were placed in this encounter.  No orders of the defined types were placed in this encounter.    Procedures: No procedures performed  Clinical Data: No additional findings.  ROS:  All other systems negative, except as noted in the HPI. Review of Systems  Objective: Vital Signs: There were no vitals taken for this visit.  Specialty Comments:  No specialty comments available.  PMFS History: Patient Active Problem List   Diagnosis Date Noted   B12 deficiency 10/04/2022   Myalgia 04/05/2022   CLL (chronic lymphocytic leukemia) (HCC) 09/30/2021   Cholangitis 03/26/2021   Cholelithiasis 03/25/2021   Elevated LFTs 03/25/2021   Chronic hyponatremia 03/25/2021   Bilateral primary osteoarthritis of knee 06/11/2019   Pain in left shoulder 05/22/2019   Vitamin D deficiency 12/25/2013   SPINAL STENOSIS, LUMBAR 03/09/2010   Osteoporosis 03/09/2010   MIGRAINE, OPHTHALMIC 10/29/2009   ADENOMATOUS COLONIC POLYP 06/10/2009   History of iron deficiency 05/06/2009   Carotid artery disease (HCC) 07/28/2008   Allergic rhinitis 03/20/2008   DYSPHAGIA 03/12/2008   Ganglion of tendon sheath 08/17/2007   Hypothyroidism 11/15/2006   Hyperlipidemia 11/07/2006   Essential hypertension 10/18/2006   GERD 10/18/2006   DIVERTICULOSIS, COLON  10/18/2006   Past Medical History:  Diagnosis Date   Anemia    Anxiety    Bifascicular block    Diverticulitis    Diverticulosis    Gastroparesis    GERD (gastroesophageal reflux disease)    Glaucoma    Hemorrhoids    Hyperlipidemia    Hypertension    Hypertension    IBS (irritable bowel syndrome)    Rectal ulcer    SCOLIOSIS, LUMBAR SPINE 03/09/2010   Qualifier: Diagnosis of  By: Lovell Sheehan MD, John E    Segmental colitis (HCC)    Tubular adenoma of colon 05/2009    Family History  Problem Relation Age of Onset   Diabetes Mother    Heart disease Mother    Hyperlipidemia Mother    Hypertension Mother    Anuerysm Father        abdominal- heavy smoker   Heart disease Father    Hypertension Father    Colon polyps Sister    Lung cancer Sister        smoker   Colon polyps Brother    Diabetes Brother    Hyperlipidemia Brother    Hypertension Brother    Heart disease Brother    Colon cancer Paternal Grandmother        with possible stomach cancer   Migraines Daughter    Kidney Stones Daughter    Colon polyps Daughter    Breast cancer Daughter    Cancer Daughter        salivary   Colon cancer Maternal Aunt        Rectal cancer   Crohn's disease Neg Hx    Pancreatic cancer Neg Hx     Past Surgical History:  Procedure Laterality Date   APPENDECTOMY     ARCUATE KERATECTOMY     BILIARY DILATION  03/27/2021   Procedure: BILIARY DILATION;  Surgeon: Rachael Fee, MD;  Location: [facility];  Service: Gastroenterology;;   CATARACT EXTRACTION, BILATERAL  2015   CHOLECYSTECTOMY N/A 03/28/2021   Procedure: LAPAROSCOPIC CHOLECYSTECTOMY;  Surgeon: Romie Levee, MD;  Location: [facility];  Service: General;  Laterality: N/A;   double pallital tori bone removed      ERCP N/A 03/27/2021   Procedure: ENDOSCOPIC RETROGRADE CHOLANGIOPANCREATOGRAPHY (ERCP);  Surgeon: Rachael Fee, MD;  Location: [facility];  Service: Gastroenterology;  Laterality: N/A;   HERNIA REPAIR      umbilical   KNEE SURGERY Bilateral    morton's neuroma on rt foot and platar facial release     MOUTH SURGERY     "bone shaved off roof of mouth"   REMOVAL OF STONES  03/27/2021  Procedure: REMOVAL OF STONES;  Surgeon: Rachael Fee, MD;  Location: [facility];  Service: Gastroenterology;;   SALIVARY GLAND SURGERY     removal   SPHINCTEROTOMY  03/27/2021   Procedure: SPHINCTEROTOMY;  Surgeon: Rachael Fee, MD;  Location: [facility];  Service: Gastroenterology;;   TONSILLECTOMY     Social History   Occupational History   Occupation: Print production planner    Comment: retired  Tobacco Use   Smoking status: Never   Smokeless tobacco: Never  Vaping Use   Vaping status: Never Used  Substance and Sexual Activity   Alcohol use: Yes    Alcohol/week: 0.0 standard drinks of alcohol    Comment: rare   Drug use: No   Sexual activity: Not Currently

## 2023-04-06 ENCOUNTER — Ambulatory Visit: Payer: Medicare Other | Admitting: Family Medicine

## 2023-05-02 ENCOUNTER — Ambulatory Visit: Payer: Medicare Other | Admitting: Family Medicine

## 2023-05-10 DIAGNOSIS — N816 Rectocele: Secondary | ICD-10-CM | POA: Diagnosis not present

## 2023-05-10 DIAGNOSIS — Z4689 Encounter for fitting and adjustment of other specified devices: Secondary | ICD-10-CM | POA: Diagnosis not present

## 2023-05-10 DIAGNOSIS — N8110 Cystocele, unspecified: Secondary | ICD-10-CM | POA: Diagnosis not present

## 2023-05-17 DIAGNOSIS — H401122 Primary open-angle glaucoma, left eye, moderate stage: Secondary | ICD-10-CM | POA: Diagnosis not present

## 2023-05-17 DIAGNOSIS — Z961 Presence of intraocular lens: Secondary | ICD-10-CM | POA: Diagnosis not present

## 2023-05-17 DIAGNOSIS — H43813 Vitreous degeneration, bilateral: Secondary | ICD-10-CM | POA: Diagnosis not present

## 2023-05-17 DIAGNOSIS — H401113 Primary open-angle glaucoma, right eye, severe stage: Secondary | ICD-10-CM | POA: Diagnosis not present

## 2023-05-25 ENCOUNTER — Other Ambulatory Visit: Payer: Self-pay | Admitting: Family Medicine

## 2023-05-29 ENCOUNTER — Other Ambulatory Visit: Payer: Self-pay

## 2023-05-29 ENCOUNTER — Telehealth: Payer: Self-pay

## 2023-05-29 ENCOUNTER — Ambulatory Visit (INDEPENDENT_AMBULATORY_CARE_PROVIDER_SITE_OTHER): Payer: Medicare Other | Admitting: Family Medicine

## 2023-05-29 ENCOUNTER — Encounter: Payer: Self-pay | Admitting: Family Medicine

## 2023-05-29 VITALS — BP 138/60 | HR 52 | Temp 97.4°F | Ht 62.0 in | Wt 116.0 lb

## 2023-05-29 DIAGNOSIS — E559 Vitamin D deficiency, unspecified: Secondary | ICD-10-CM

## 2023-05-29 DIAGNOSIS — E538 Deficiency of other specified B group vitamins: Secondary | ICD-10-CM | POA: Diagnosis not present

## 2023-05-29 DIAGNOSIS — I779 Disorder of arteries and arterioles, unspecified: Secondary | ICD-10-CM

## 2023-05-29 DIAGNOSIS — E039 Hypothyroidism, unspecified: Secondary | ICD-10-CM | POA: Diagnosis not present

## 2023-05-29 DIAGNOSIS — C9110 Chronic lymphocytic leukemia of B-cell type not having achieved remission: Secondary | ICD-10-CM

## 2023-05-29 DIAGNOSIS — I10 Essential (primary) hypertension: Secondary | ICD-10-CM

## 2023-05-29 DIAGNOSIS — E871 Hypo-osmolality and hyponatremia: Secondary | ICD-10-CM

## 2023-05-29 LAB — VITAMIN D 25 HYDROXY (VIT D DEFICIENCY, FRACTURES): VITD: 47.92 ng/mL (ref 30.00–100.00)

## 2023-05-29 LAB — CBC WITH DIFFERENTIAL/PLATELET
Basophils Absolute: 0 10*3/uL (ref 0.0–0.1)
Basophils Relative: 0.2 % (ref 0.0–3.0)
Eosinophils Absolute: 0.2 10*3/uL (ref 0.0–0.7)
Eosinophils Relative: 0.8 % (ref 0.0–5.0)
HCT: 36.6 % (ref 36.0–46.0)
Hemoglobin: 12.3 g/dL (ref 12.0–15.0)
Lymphocytes Relative: 87.6 % — ABNORMAL HIGH (ref 12.0–46.0)
Lymphs Abs: 20.1 10*3/uL — ABNORMAL HIGH (ref 0.7–4.0)
MCHC: 33.6 g/dL (ref 30.0–36.0)
MCV: 101.8 fL — ABNORMAL HIGH (ref 78.0–100.0)
Monocytes Absolute: 0.4 10*3/uL (ref 0.1–1.0)
Monocytes Relative: 1.7 % — ABNORMAL LOW (ref 3.0–12.0)
Neutro Abs: 2.2 10*3/uL (ref 1.4–7.7)
Neutrophils Relative %: 9.7 % — ABNORMAL LOW (ref 43.0–77.0)
Platelets: 120 10*3/uL — ABNORMAL LOW (ref 150.0–400.0)
RBC: 3.59 Mil/uL — ABNORMAL LOW (ref 3.87–5.11)
RDW: 13.3 % (ref 11.5–15.5)
WBC: 22.9 10*3/uL (ref 4.0–10.5)

## 2023-05-29 LAB — TSH: TSH: 2.84 u[IU]/mL (ref 0.35–5.50)

## 2023-05-29 NOTE — Telephone Encounter (Signed)
 Patient with known CLL and this is within her range of variation

## 2023-05-29 NOTE — Addendum Note (Signed)
 Addended by: Gwenette Greet on: 05/29/2023 03:11 PM   Modules accepted: Orders

## 2023-05-29 NOTE — Telephone Encounter (Signed)
 Already received this on phone note-not sure why we are receiving separate MyChart message-see phone note

## 2023-05-29 NOTE — Progress Notes (Signed)
 Phone [phone] In person visit   Subjective:   Rebecca [name] is a 89 y.o. year old very pleasant female patient who presents for/with See problem oriented charting Chief Complaint  Patient presents with   Medical Management of Chronic Issues   Hyperlipidemia   Hypertension    Past Medical History-  Patient Active Problem List   Diagnosis Date Noted   CLL (chronic lymphocytic leukemia) (HCC) 09/30/2021    Priority: High   Carotid artery disease (HCC) 07/28/2008    Priority: High   B12 deficiency 10/04/2022    Priority: Medium    Myalgia 04/05/2022    Priority: Medium    Chronic hyponatremia 03/25/2021    Priority: Medium    Vitamin D deficiency 12/25/2013    Priority: Medium    SPINAL STENOSIS, LUMBAR 03/09/2010    Priority: Medium    Osteoporosis 03/09/2010    Priority: Medium    MIGRAINE, OPHTHALMIC 10/29/2009    Priority: Medium    Hypothyroidism 11/15/2006    Priority: Medium    Hyperlipidemia 11/07/2006    Priority: Medium    Essential hypertension 10/18/2006    Priority: Medium    GERD 10/18/2006    Priority: Medium    Cholangitis 03/26/2021    Priority: Low   Cholelithiasis 03/25/2021    Priority: Low   Elevated LFTs 03/25/2021    Priority: Low   Bilateral primary osteoarthritis of knee 06/11/2019    Priority: Low   Pain in left shoulder 05/22/2019    Priority: Low   ADENOMATOUS COLONIC POLYP 06/10/2009    Priority: Low   History of iron deficiency 05/06/2009    Priority: Low   Allergic rhinitis 03/20/2008    Priority: Low   DYSPHAGIA 03/12/2008    Priority: Low   Ganglion of tendon sheath 08/17/2007    Priority: Low   DIVERTICULOSIS, COLON 10/18/2006    Priority: Low    Medications- reviewed and updated Current Outpatient Medications  Medication Sig Dispense Refill   acetaminophen (TYLENOL) 500 MG tablet Take 500-1,000 mg by mouth every 6 (six) hours as needed for mild pain or headache.     atorvastatin (LIPITOR) 10 MG tablet Take  1 tablet (10 mg total) by mouth 3 (three) times a week. 39 tablet 3   Calcium Carb-Cholecalciferol (CALCIUM-VITAMIN D) 600-400 MG-UNIT TABS Take 1 tablet by mouth daily.     Cholecalciferol (VITAMIN D3) 2000 UNITS TABS Take 1 tablet by mouth daily.     diclofenac sodium (VOLTAREN) 1 % GEL Apply 2 g topically 4 (four) times daily. (Patient taking differently: Apply 2 g topically 4 (four) times daily as needed (for pain).) 1 Tube 1   esomeprazole (NEXIUM) 20 MG capsule Take 20 mg by mouth 2 (two) times daily before a meal.     estradiol (ESTRACE) 0.1 MG/GM vaginal cream Place 0.5 g vaginally See admin instructions. Place 0.5 g vaginally two to three times a week     levothyroxine (SYNTHROID) 88 MCG tablet TAKE 1 TABLET(88 MCG) BY MOUTH DAILY BEFORE BREAKFAST 90 tablet 3   loperamide (IMODIUM) 2 MG capsule Take 1 capsule (2 mg total) by mouth 4 (four) times daily as needed for diarrhea or loose stools. 12 capsule 0   losartan (COZAAR) 100 MG tablet TAKE 1 TABLET(100 MG) BY MOUTH DAILY 90 tablet 3   metoCLOPramide (REGLAN) 5 MG tablet TAKE 1 TABLET BY MOUTH TWICE DAILY WITH MEALS 60 tablet 3   ondansetron (ZOFRAN-ODT) 4 MG disintegrating tablet Take 1 tablet (4  mg total) by mouth every 8 (eight) hours as needed for nausea or vomiting. 20 tablet 0   ROCKLATAN 0.02-0.005 % SOLN      timolol (TIMOPTIC) 0.5 % ophthalmic solution Place 1 drop into both eyes 2 (two) times daily.     vitamin B-12 (CYANOCOBALAMIN) 500 MCG tablet Take 500 mcg by mouth every morning.     vitamin C (ASCORBIC ACID) 500 MG tablet Take 500 mg by mouth daily.     VYZULTA 0.024 % SOLN Place 1 drop into both eyes at bedtime.     ALPRAZolam (XANAX) 0.25 MG tablet TAKE 1 TABLET BY MOUTH AT BEDTIME AS NEEDED. DO NOT DRIVE FOR 8 HOURS AFTER TAKING AND STOP IF ANY FALLS UNTIL NEXT DOCTOR VISIT (Patient not taking: Reported on 05/29/2023) 20 tablet 1   cyclobenzaprine (FLEXERIL) 10 MG tablet Take 1 tablet (10 mg total) by mouth at bedtime as  needed for muscle spasms. 30 tablet 0   nystatin-triamcinolone (MYCOLOG II) cream Apply 1 Application topically See admin instructions. Apply to the affected area 2 times a week as needed for irritation     No current facility-administered medications for this visit.     Objective:  BP 138/60   Pulse (!) 52   Temp (!) 97.4 F (36.3 C)   Ht 5\' 2"  (1.575 m)   Wt 116 lb (52.6 kg)   SpO2 96%   BMI 21.22 kg/m  Gen: NAD, resting comfortably CV: RRR no murmurs rubs or gallops Lungs: CTAB no crackles, wheeze, rhonchi Ext: no edema Skin: warm, dry, scar on left elbow noted- mildly tender around this Walks with cane     Assessment and Plan   #Left elbow abrasion- scar at this point- mildly tender -stressed importance of fall avoidance  # Chronic hyponatremia-labs ordered today- Dr. Candise Che has thought low solute intake and possibly thyroid. She reports low salt diet which likely contributes- she states has been low for years.   % CLL- follows with Dr. Candise Che- has seen since our last visit and overall stable   #hypertension S: medication: losartan 100mg  Home readings #s: 130s/65 to 70 BP Readings from Last 3 Encounters:  05/29/23 138/60  03/06/23 (!) 144/78  03/01/23 (!) 198/76  A/P: high acceptable blood pressure today but looks even better at home- continue current medications    #hyperlipidemia # Carotid stenosis 1 to 39% on the right and left ICA, ECA greater than 50% in 2022- stable in 2024 S: Medication:atorvastatin 10 mg three times a week Lab Results  Component Value Date   CHOL 267 (H) 04/05/2022   HDL 47.70 04/05/2022   LDLCALC 186 (H) 04/05/2022   LDLDIRECT 125.0 10/04/2022   TRIG 165.0 (H) 04/05/2022   CHOLHDL 6 04/05/2022  A/P: with carotid stenosis even though minimal we are hoping for cholesterol improvement today- for now continue current medications atorvastatin 10 mg 3 days a week- update lipids   - for carotid stenosis overall mild- recheck  2026  #hypothyroidism S: compliant On thyroid medication-levothyroxine 88 mcg  Lab Results  Component Value Date   TSH 3.44 10/04/2022  A/P:well controlled last visit- update levels today- continue current medications    # Osteoporosis #Vitamin D deficiency S: Last DEXA:  with GYN and has planned repeat- has upcoming visit to discuss Medication (bisphosphonate or prolia): 5 years reclast  through GYN in past  Calcium: 1200mg  (through diet ok) recommended  Vitamin D: At least 1000 units a day recommended-takes 2000 units A/P: reports stable- upcoming  gynecology visit to discuss    # GERD S:Medication: Nexium 20 mg -Takes empiric B12 with this  A/P: stable- continue current medicines     #Lumbar stenosis with history of sciatica- ongoing pain issues- considering updating injection- has seen neurosurgery in past   # B12 deficiency- looked good last time- check next labs- hold off today Lab Results  Component Value Date   VITAMINB12 >1500 (H) 10/04/2022    Recommended follow up: No follow-ups on file. Future Appointments  Date Time Provider Department Center  06/13/2023  1:00 PM [facility] None  06/13/2023  1:30 PM Johney Maine, MD [facility] None     Lab/Order associations: FASTING other than toast 7 am and its over 6 hours later   ICD-10-CM   1. Chronic hyponatremia  E87.1 Sodium, urine, random    Osmolality, urine    Osmolality    Comprehensive metabolic panel    2. Bilateral carotid artery disease, unspecified type (HCC)  I77.9     3. CLL (chronic lymphocytic leukemia) (HCC)  C91.10     4. Hypothyroidism, unspecified type  E03.9 TSH    5. Vitamin D deficiency  E55.9 VITAMIN D 25 Hydroxy (Vit-D Deficiency, Fractures)    6. Essential hypertension  I10 Comprehensive metabolic panel    CBC with Differential/Platelet    7. B12 deficiency  E53.8       No orders of the defined types were placed in this encounter.   Return precautions  advised.  Tana Conch, MD

## 2023-05-29 NOTE — Addendum Note (Signed)
 Addended by: Shelva Majestic on: 05/29/2023 09:47 PM   Modules accepted: Level of Service

## 2023-05-29 NOTE — Telephone Encounter (Signed)
 Rebecca [name] from Lab calling to report Critical Lab results:  White Count - 22.9!  Sent to Dr Durene Cal to advise

## 2023-05-29 NOTE — Patient Instructions (Addendum)
 Please stop by lab before you go If you have mychart- we will send your results within 3 business days of [place] receiving them.  If you do not have mychart- we will call you about results within 5 business days of [place] receiving them.  *please also note that you will see labs on mychart as soon as they post. I will later go in and write notes on them- will say "notes from Dr. Durene Cal"   Recommended follow up: Return in about 6 months (around 11/26/2023) for followup or sooner if needed.Schedule b4 you leave.

## 2023-05-30 ENCOUNTER — Encounter: Payer: Self-pay | Admitting: Family Medicine

## 2023-05-30 ENCOUNTER — Other Ambulatory Visit: Payer: Self-pay

## 2023-05-30 DIAGNOSIS — I10 Essential (primary) hypertension: Secondary | ICD-10-CM | POA: Diagnosis not present

## 2023-05-30 LAB — COMPREHENSIVE METABOLIC PANEL
ALT: 12 U/L (ref 0–35)
AST: 21 U/L (ref 0–37)
Albumin: 4.3 g/dL (ref 3.5–5.2)
Alkaline Phosphatase: 56 U/L (ref 39–117)
BUN: 16 mg/dL (ref 6–23)
CO2: 23 meq/L (ref 19–32)
Calcium: 9.8 mg/dL (ref 8.4–10.5)
Chloride: 99 meq/L (ref 96–112)
Creatinine, Ser: 1.01 mg/dL (ref 0.40–1.20)
GFR: 49.79 mL/min — ABNORMAL LOW (ref >60.00)
Glucose, Bld: 102 mg/dL — ABNORMAL HIGH (ref 70–99)
Potassium: 4.6 meq/L (ref 3.5–5.1)
Sodium: 133 meq/L — ABNORMAL LOW (ref 135–145)
Total Bilirubin: 0.6 mg/dL (ref 0.2–1.2)
Total Protein: 6.5 g/dL (ref 6.0–8.3)

## 2023-05-30 LAB — OSMOLALITY: Osmolality: 277 mosm/kg — ABNORMAL LOW (ref 278–305)

## 2023-05-31 ENCOUNTER — Encounter: Payer: Self-pay | Admitting: Family Medicine

## 2023-05-31 LAB — OSMOLALITY, URINE: Osmolality, Ur: 201 mosm/kg (ref 50–1200)

## 2023-06-01 ENCOUNTER — Encounter: Payer: Self-pay | Admitting: Family Medicine

## 2023-06-02 ENCOUNTER — Other Ambulatory Visit: Payer: Self-pay

## 2023-06-02 DIAGNOSIS — I10 Essential (primary) hypertension: Secondary | ICD-10-CM

## 2023-06-05 ENCOUNTER — Telehealth: Payer: Self-pay

## 2023-06-05 NOTE — Telephone Encounter (Signed)
 Noted.  Copied from CRM [id number]. Topic: Appointments - Scheduling Inquiry for Clinic >> Jun 05, 2023 12:00 PM Tiffany H wrote: Reason for CRM: Patient called to advise that she cannot bring specimen in today. She will bring it in tomorrow. No further action needed.

## 2023-06-06 ENCOUNTER — Other Ambulatory Visit

## 2023-06-06 DIAGNOSIS — Z961 Presence of intraocular lens: Secondary | ICD-10-CM | POA: Diagnosis not present

## 2023-06-06 DIAGNOSIS — H401122 Primary open-angle glaucoma, left eye, moderate stage: Secondary | ICD-10-CM | POA: Diagnosis not present

## 2023-06-06 DIAGNOSIS — I10 Essential (primary) hypertension: Secondary | ICD-10-CM

## 2023-06-06 DIAGNOSIS — H401113 Primary open-angle glaucoma, right eye, severe stage: Secondary | ICD-10-CM | POA: Diagnosis not present

## 2023-06-06 DIAGNOSIS — H43813 Vitreous degeneration, bilateral: Secondary | ICD-10-CM | POA: Diagnosis not present

## 2023-06-07 ENCOUNTER — Encounter: Payer: Self-pay | Admitting: Family Medicine

## 2023-06-07 LAB — SODIUM, URINE, RANDOM: Sodium, Ur: 57 mmol/L (ref 28–272)

## 2023-06-12 ENCOUNTER — Other Ambulatory Visit: Payer: Self-pay | Admitting: *Deleted

## 2023-06-12 DIAGNOSIS — C9110 Chronic lymphocytic leukemia of B-cell type not having achieved remission: Secondary | ICD-10-CM

## 2023-06-13 ENCOUNTER — Inpatient Hospital Stay (HOSPITAL_BASED_OUTPATIENT_CLINIC_OR_DEPARTMENT_OTHER): Payer: Medicare Other | Admitting: Hematology

## 2023-06-13 ENCOUNTER — Inpatient Hospital Stay: Payer: Medicare Other | Attending: Internal Medicine

## 2023-06-13 VITALS — BP 135/58 | HR 44 | Temp 97.9°F | Resp 18 | Ht 62.0 in | Wt 118.0 lb

## 2023-06-13 DIAGNOSIS — R001 Bradycardia, unspecified: Secondary | ICD-10-CM | POA: Insufficient documentation

## 2023-06-13 DIAGNOSIS — E871 Hypo-osmolality and hyponatremia: Secondary | ICD-10-CM | POA: Diagnosis not present

## 2023-06-13 DIAGNOSIS — C9110 Chronic lymphocytic leukemia of B-cell type not having achieved remission: Secondary | ICD-10-CM

## 2023-06-13 DIAGNOSIS — Z79899 Other long term (current) drug therapy: Secondary | ICD-10-CM | POA: Insufficient documentation

## 2023-06-13 DIAGNOSIS — E039 Hypothyroidism, unspecified: Secondary | ICD-10-CM | POA: Diagnosis not present

## 2023-06-13 DIAGNOSIS — H01005 Unspecified blepharitis left lower eyelid: Secondary | ICD-10-CM | POA: Diagnosis not present

## 2023-06-13 LAB — CMP (CANCER CENTER ONLY)
ALT: 12 U/L (ref 0–44)
AST: 19 U/L (ref 15–41)
Albumin: 4.3 g/dL (ref 3.5–5.0)
Alkaline Phosphatase: 76 U/L (ref 38–126)
Anion gap: 6 (ref 5–15)
BUN: 15 mg/dL (ref 8–23)
CO2: 27 mmol/L (ref 22–32)
Calcium: 9.8 mg/dL (ref 8.9–10.3)
Chloride: 98 mmol/L (ref 98–111)
Creatinine: 0.96 mg/dL (ref 0.44–1.00)
GFR, Estimated: 57 mL/min — ABNORMAL LOW (ref >60)
Glucose, Bld: 88 mg/dL (ref 70–99)
Potassium: 4.3 mmol/L (ref 3.5–5.1)
Sodium: 131 mmol/L — ABNORMAL LOW (ref 135–145)
Total Bilirubin: 0.5 mg/dL (ref 0.0–1.2)
Total Protein: 6.1 g/dL — ABNORMAL LOW (ref 6.5–8.1)

## 2023-06-13 LAB — CBC WITH DIFFERENTIAL (CANCER CENTER ONLY)
Abs Immature Granulocytes: 0.03 10*3/uL (ref 0.00–0.07)
Basophils Absolute: 0.1 10*3/uL (ref 0.0–0.1)
Basophils Relative: 0 %
Eosinophils Absolute: 0.2 10*3/uL (ref 0.0–0.5)
Eosinophils Relative: 1 %
HCT: 35.3 % — ABNORMAL LOW (ref 36.0–46.0)
Hemoglobin: 11.7 g/dL — ABNORMAL LOW (ref 12.0–15.0)
Immature Granulocytes: 0 %
Lymphocytes Relative: 75 %
Lymphs Abs: 14.8 10*3/uL — ABNORMAL HIGH (ref 0.7–4.0)
MCH: 33.1 pg (ref 26.0–34.0)
MCHC: 33.1 g/dL (ref 30.0–36.0)
MCV: 99.7 fL (ref 80.0–100.0)
Monocytes Absolute: 2.6 10*3/uL — ABNORMAL HIGH (ref 0.1–1.0)
Monocytes Relative: 13 %
Neutro Abs: 2.1 10*3/uL (ref 1.7–7.7)
Neutrophils Relative %: 11 %
Platelet Count: 129 10*3/uL — ABNORMAL LOW (ref 150–400)
RBC: 3.54 MIL/uL — ABNORMAL LOW (ref 3.87–5.11)
RDW: 12.6 % (ref 11.5–15.5)
Smear Review: NORMAL
WBC Count: 19.8 10*3/uL — ABNORMAL HIGH (ref 4.0–10.5)
nRBC: 0 % (ref 0.0–0.2)

## 2023-06-13 LAB — LACTATE DEHYDROGENASE: LDH: 141 U/L (ref 98–192)

## 2023-06-13 NOTE — Progress Notes (Signed)
 [name] Kitchen   HEMATOLOGY/ONCOLOGY CLINIC NOTE  Date of Service: 06/13/2023  Patient Care Team: Shelva Majestic, MD as PCP - General (Family Medicine) Meryl Dare, MD (Inactive) as Consulting Physician (Gastroenterology) Noland Fordyce, MD as Consulting Physician (Obstetrics and Gynecology) Maris Berger, MD as Consulting Physician (Ophthalmology) Verner Chol, [facility] (Inactive) as Pharmacist (Pharmacist)  CHIEF COMPLAINTS/PURPOSE OF CONSULTATION:  Follow-up for continued evaluation and management of CLL  HISTORY OF PRESENTING ILLNESS:  Rebecca [name] is a wonderful 89 y.o. female who has been referred to [place] by Dr Romie Levee MD for evaluation and management of lymphocytosis.  Patient has a history of hypertension, GERD, hypothyroidism was noted to have biliary colic in November 2022 and referred for surgical evaluation due to persistent and worsening symptoms.  In the emergency room the patient was noted to have elevated LFTs and significant leukocytosis of 27.4k.  CT scan showed cholelithiasis, ultrasound abdomen showed cholelithiasis without acute cholecystitis.  MRCP showed distended CBD with choledocholithiasis.  Patient subsequently had ERCP on 12/24 with biliary sphincterotomy and subsequently had her laparoscopic cholecystectomy on 03/28/2021.  Surgical pathology from her cholecystectomy showed chronic cholecystitis without cholelithiasis. 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 Labs ordered 04/09/2021 showed persistent leukocytosis of 19.2k with lymphocyte count of 15.2k hemoglobin of 11.1 and platelets of 219k.  Patient was referred to [place] for further evaluation of CD5 positive lymphoproliferative disorder.  Patient notes no fevers no chills no night sweats. No new lumps or bumps. CT abdomen 04/15/2020 showed enlarged abdominal pelvic lymph nodes.  INTERVAL HISTORY  Rebecca [name] is a 89 y.o. female is here for  continued evaluation and management of her chronic lymphocytic leukemia.   Patient was last seen by me on 12/13/2022 and she was doing well overall.   Patient notes she has been doing well overall since our last visit. She denies any new infection issues, fever, chills, night sweats, unexpected weight loss, back pain, chest pain, new lump/bump, abdominal pain, or leg swelling.   Patient notes she fell around Thanksgiving. She has been using crutches and a cane for support.     Patient continues to follow-up with her PCP every 63-months.   MEDICAL HISTORY:  Past Medical History:  Diagnosis Date   Anemia    Anxiety    Bifascicular block    Diverticulitis    Diverticulosis    Gastroparesis    GERD (gastroesophageal reflux disease)    Glaucoma    Hemorrhoids    Hyperlipidemia    Hypertension    Hypertension    IBS (irritable bowel syndrome)    Rectal ulcer    SCOLIOSIS, LUMBAR SPINE 03/09/2010   Qualifier: Diagnosis of  By: Lovell Sheehan MD, John E    Segmental colitis [facility])    Tubular adenoma of colon 05/2009    SURGICAL HISTORY: Past Surgical History:  Procedure Laterality Date   APPENDECTOMY     ARCUATE KERATECTOMY     BILIARY DILATION  03/27/2021   Procedure: BILIARY DILATION;  Surgeon: Rachael Fee, MD;  Location: [facility];  Service: Gastroenterology;;   CATARACT EXTRACTION, BILATERAL  2015   CHOLECYSTECTOMY N/A 03/28/2021   Procedure: LAPAROSCOPIC CHOLECYSTECTOMY;  Surgeon: Romie Levee, MD;  Location: [facility];  Service: General;  Laterality: N/A;   double pallital tori bone removed      ERCP N/A 03/27/2021   Procedure: ENDOSCOPIC RETROGRADE CHOLANGIOPANCREATOGRAPHY (ERCP);  Surgeon: Rachael Fee, MD;  Location: [facility];  Service: Gastroenterology;  Laterality: N/A;   HERNIA REPAIR     umbilical   KNEE SURGERY Bilateral    morton's neuroma on rt foot and platar facial release     MOUTH SURGERY     "bone shaved off roof of mouth"   REMOVAL OF STONES   03/27/2021   Procedure: REMOVAL OF STONES;  Surgeon: Rachael Fee, MD;  Location: [facility];  Service: Gastroenterology;;   SALIVARY GLAND SURGERY     removal   SPHINCTEROTOMY  03/27/2021   Procedure: SPHINCTEROTOMY;  Surgeon: Rachael Fee, MD;  Location: [facility];  Service: Gastroenterology;;   TONSILLECTOMY      SOCIAL HISTORY: Social History   Socioeconomic History   Marital status: Widowed    Spouse name: Not on file   Number of children: 3   Years of education: Not on file   Highest education level: 12th grade  Occupational History   Occupation: Print production planner    Comment: retired  Tobacco Use   Smoking status: Never   Smokeless tobacco: Never  Vaping Use   Vaping status: Never Used  Substance and Sexual Activity   Alcohol use: Yes    Alcohol/week: 0.0 standard drinks of alcohol    Comment: rare   Drug use: No   Sexual activity: Not Currently  Other Topics Concern   Not on file  Social History Narrative   Widowed. 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 30+ years      Hobbies: Enjoys reading, especially mysteries, Walks for exercise   Attends church                  Social Drivers of Corporate investment banker Strain: Low Risk  (05/28/2023)   Overall Financial Resource Strain (CARDIA)    Difficulty of Paying Living Expenses: Not hard at all  Food Insecurity: No Food Insecurity (05/28/2023)   Hunger Vital Sign    Worried About Running Out of Food in the Last Year: Never true    Ran Out of Food in the Last Year: Never true  Transportation Needs: No Transportation Needs (05/28/2023)   PRAPARE - Administrator, Civil Service (Medical): No    Lack of Transportation (Non-Medical): No  Physical Activity: Insufficiently Active (05/28/2023)   Exercise Vital Sign    Days of Exercise per Week: 6 days    Minutes of Exercise per Session:  20 min  Stress: No Stress Concern Present (05/28/2023)   [employer] of Occupational Health - Occupational Stress Questionnaire    Feeling of Stress : Only a little  Social Connections: Moderately Isolated (05/28/2023)   Social Connection and Isolation Panel [NHANES]    Frequency of Communication with Friends and Family: More than three times a week    Frequency of Social Gatherings with Friends and Family: Patient declined    Attends Religious Services: More than 4 times per year    Active Member of [employer] or Organizations: No    Attends Banker Meetings: Never    Marital Status: Widowed  Intimate Partner Violence: Not At Risk (01/05/2023)   Humiliation, Afraid, Rape, and Kick questionnaire    Fear of Current or Ex-Partner: No    Emotionally Abused: No    Physically Abused: No    Sexually Abused: No    FAMILY HISTORY: Family History  Problem Relation Age of Onset  Diabetes Mother    Heart disease Mother    Hyperlipidemia Mother    Hypertension Mother    Anuerysm Father        abdominal- heavy smoker   Heart disease Father    Hypertension Father    Colon polyps Sister    Lung cancer Sister        smoker   Colon polyps Brother    Diabetes Brother    Hyperlipidemia Brother    Hypertension Brother    Heart disease Brother    Colon cancer Paternal Grandmother        with possible stomach cancer   Migraines Daughter    Kidney Stones Daughter    Colon polyps Daughter    Breast cancer Daughter    Cancer Daughter        salivary   Colon cancer Maternal Aunt        Rectal cancer   Crohn's disease Neg Hx    Pancreatic cancer Neg Hx     ALLERGIES:  is allergic to levaquin [levofloxacin], sulfamethoxazole-trimethoprim, lactose intolerance (gi), tramadol hcl, augmentin [amoxicillin-pot clavulanate], ciprofloxacin, rosuvastatin, zosyn [piperacillin sod-tazobactam so], adhesive [tape], codeine, and pneumococcal vaccine polyvalent.  MEDICATIONS:  Current  Outpatient Medications  Medication Sig Dispense Refill   acetaminophen (TYLENOL) 500 MG tablet Take 500-1,000 mg by mouth every 6 (six) hours as needed for mild pain or headache.     ALPRAZolam (XANAX) 0.25 MG tablet TAKE 1 TABLET BY MOUTH AT BEDTIME AS NEEDED. DO NOT DRIVE FOR 8 HOURS AFTER TAKING AND STOP IF ANY FALLS UNTIL NEXT DOCTOR VISIT (Patient not taking: Reported on 05/29/2023) 20 tablet 1   atorvastatin (LIPITOR) 10 MG tablet Take 1 tablet (10 mg total) by mouth 3 (three) times a week. 39 tablet 3   Calcium Carb-Cholecalciferol (CALCIUM-VITAMIN D) 600-400 MG-UNIT TABS Take 1 tablet by mouth daily.     Cholecalciferol (VITAMIN D3) 2000 UNITS TABS Take 1 tablet by mouth daily.     cyclobenzaprine (FLEXERIL) 10 MG tablet Take 1 tablet (10 mg total) by mouth at bedtime as needed for muscle spasms. 30 tablet 0   diclofenac sodium (VOLTAREN) 1 % GEL Apply 2 g topically 4 (four) times daily. (Patient taking differently: Apply 2 g topically 4 (four) times daily as needed (for pain).) 1 Tube 1   esomeprazole (NEXIUM) 20 MG capsule Take 20 mg by mouth 2 (two) times daily before a meal.     estradiol (ESTRACE) 0.1 MG/GM vaginal cream Place 0.5 g vaginally See admin instructions. Place 0.5 g vaginally two to three times a week     levothyroxine (SYNTHROID) 88 MCG tablet TAKE 1 TABLET(88 MCG) BY MOUTH DAILY BEFORE BREAKFAST 90 tablet 3   loperamide (IMODIUM) 2 MG capsule Take 1 capsule (2 mg total) by mouth 4 (four) times daily as needed for diarrhea or loose stools. 12 capsule 0   losartan (COZAAR) 100 MG tablet TAKE 1 TABLET(100 MG) BY MOUTH DAILY 90 tablet 3   metoCLOPramide (REGLAN) 5 MG tablet TAKE 1 TABLET BY MOUTH TWICE DAILY WITH MEALS 60 tablet 3   ondansetron (ZOFRAN-ODT) 4 MG disintegrating tablet Take 1 tablet (4 mg total) by mouth every 8 (eight) hours as needed for nausea or vomiting. 20 tablet 0   ROCKLATAN 0.02-0.005 % SOLN      timolol (TIMOPTIC) 0.5 % ophthalmic solution Place 1 drop  into both eyes 2 (two) times daily.     vitamin B-12 (CYANOCOBALAMIN) 500 MCG tablet Take 500 mcg by  mouth every morning.     vitamin C (ASCORBIC ACID) 500 MG tablet Take 500 mg by mouth daily.     VYZULTA 0.024 % SOLN Place 1 drop into both eyes at bedtime.     No current facility-administered medications for this visit.    REVIEW OF SYSTEMS:   10 Point review of Systems was done is negative except as noted above.  PHYSICAL EXAMINATION:  .BP (!) 135/58 (BP Location: Right Arm, Patient Position: Sitting) Comment: Nurse notified  Pulse (!) 44 Comment: Nurse notified  Temp 97.9 F (36.6 C) (Temporal)   Resp 18   Ht 5\' 2"  (1.575 m)   Wt 118 lb (53.5 kg)   SpO2 100%   BMI 21.58 kg/m  NAD GENERAL:alert, in no acute distress and comfortable SKIN: no acute rashes, no significant lesions EYES: conjunctiva are pink and non-injected, sclera anicteric NECK: supple, no JVD LYMPH:  no palpable lymphadenopathy in the cervical, axillary or inguinal regions LUNGS: clear to auscultation b/l with normal respiratory effort HEART: regular rate & rhythm ABDOMEN:  normoactive bowel sounds , non tender, not distended. Extremity: no pedal edema PSYCH: alert & oriented x 3 with fluent speech NEURO: no focal motor/sensory deficits  LABORATORY DATA:  I have reviewed the data as listed     Latest Ref Rng & Units 06/13/2023    1:03 PM 05/29/2023    1:26 PM 12/13/2022   12:51 PM  CBC  WBC 4.0 - 10.5 K/uL 19.8  22.9 Repeated and verified X2.  19.4   Hemoglobin 12.0 - 15.0 g/dL 16.1  09.6  04.5   Hematocrit 36.0 - 46.0 % 35.3  36.6  33.6   Platelets 150 - 400 K/uL 129  120.0  121       Latest Ref Rng & Units 06/13/2023    1:03 PM 05/29/2023    1:26 PM 12/13/2022   12:51 PM  CMP  Glucose 70 - 99 mg/dL 88  409  87   BUN 8 - 23 mg/dL 15  16  15    Creatinine 0.44 - 1.00 mg/dL 8.11  9.14  7.82   Sodium 135 - 145 mmol/L 131  133  129   Potassium 3.5 - 5.1 mmol/L 4.3  4.6  4.4   Chloride 98 - 111  mmol/L 98  99  97   CO2 22 - 32 mmol/L 27  23  27    Calcium 8.9 - 10.3 mg/dL 9.8  9.8  95.6   Total Protein 6.5 - 8.1 g/dL 6.1  6.5  6.0   Total Bilirubin 0.0 - 1.2 mg/dL 0.5  0.6  0.5   Alkaline Phos 38 - 126 U/L 76  56  65   AST 15 - 41 U/L 19  21  20    ALT 0 - 44 U/L 12  12  12     Component     Latest Ref Rng & Units 05/05/2021  Hep B Core Total Ab     NON REACTIVE NON REACTIVE  Hepatitis B Surface Ag     NON REACTIVE NON REACTIVE  HCV Ab     NON REACTIVE NON REACTIVE  LDH     98 - 192 U/L 123   Surgical Pathology  CASE: [id number]  PATIENT: Rebecca [name]  Flow Pathology Report      Clinical history: CLL    DIAGNOSIS:   -  Monoclonal B-cell population with co-expression of CD5 comprises 80%  of all lymphocytes  -  See comment  COMMENT:   The phenotypic features are consistent with involvement by non-Hodgkin  B-cell lymphoma. Given the presence of CD5 expression, the differential  diagnosis includes chronic lymphocytic leukemia/small lymphocytic  lymphoma and mantle cell lymphoma.   PATHOLOGY SURGICAL PATHOLOGY   THIS IS AN ADDENDUM REPORT   CASE: [id number]  PATIENT: Rebecca [name]  Surgical Pathology Report  Addendum   Reason for Addendum #1:  Immunohistochemistry results   Clinical History: Acute Cholecystitis (j[facility])      FINAL MICROSCOPIC DIAGNOSIS:   A. GALLBLADDER, CHOLECYSTECTOMY:  - Chronic cholecystitis, without cholelithiasis.  - Atypical CD5+ B cell infiltrate, most suggestive of involvement by  CLL/SLL.   COMMENT:   A. Given the presence of marked lymphocytic inflammation,  immunohistochemical studies were performed. Studies show a significant  predominance of CD20+ B cells, with aberrant co-expression of CD5 and  CD23. There are relatively fewer background CD3+ T cells. The features  are most suggestive of involvement by CLL/SLL. An immunohistochemical  study for Cyclin-D1 is pending, and will be reported as an addendum.   Recommend correlation with peripheral blood findings and evaluation for  other potential sites of adenopathy.     RADIOGRAPHIC STUDIES: I have personally reviewed the radiological images as listed and agreed with the findings in the report. No results found.  ASSESSMENT & PLAN:   89 year old female with recent laparoscopic cholecystectomy noted to have  1) recently diagnosed CLL/SLL with 13 q. and 11 q. Deletions. 2) CD5 positive lymphoid infiltrate in gallbladder pathology suggestive of involvement by CLL/SLL -CT abdomen was done recently on 03/25/2021-did not show significant lymphadenopathy or hepatosplenomegaly.  Noted to have some lung nodules in the lower lobes.  3) .  Basal cell carcinoma on the right side of the nose Seeing Dr. Karlyn Agee at [facility] dermatology for management  -Continue active surveillance for recurrent skin cancers since the CLL especially with 13 q. deletion would be a risk factor.  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  5) hyponatremia-this appears chronic for about a year with sodium levels fluctuating from 125-133.  Likely multifactorial including poor solute intake and potentially from her hypothyroidism. 6) chronic bradycardia -likely multifactorial.  Also has hypothyroidism.  PLAN: -Discussed lab results from today, 06/13/2023, in detail with the patient. CBC shows elevated WBC of 19.8 K, slightly low Hgb of 11.7 g/dL with Hct of 21.3%, and low platelets of 129 K. CBC is stable overall from last visit.  Cmp reviewed. -No clinical or lab results suggestive of significant progression of chronic lymphocytic leukemia. -No treatment needed at this time for CLL.  -Continue to follow-up with dermatologist for continued evaluation and management of cutaneous squamous cell and basal cell carcinomas.  Again discussed how CLL does increase her risk for these as does the specific finding of the 13 q. Deletion and age. --Recommend  to stay up-to-speed with influenza vaccine, COVID-19 Booster, RSV vaccine, and other age related vaccines.      FOLLOW-UP: RTC with Dr Candise Che with labs in 6 months   The total time spent in the appointment was 20 minutes* .  All of the patient's questions were answered with apparent satisfaction. The patient knows to call the clinic with any problems, questions or concerns.   Wyvonnia Lora MD MS AAHIVMS [facility] Hematology/Oncology Physician [facility]  .*Total Encounter Time as defined by the Centers for Medicare and Medicaid Services includes, in addition to the face-to-face time of a patient visit (documented in the  note above) non-face-to-face time: obtaining and reviewing outside history, ordering and reviewing medications, tests or procedures, care coordination (communications with other health care professionals or caregivers) and documentation in the medical record.   I,Param Shah,acting as a Neurosurgeon for Wyvonnia Lora, MD.,have documented all relevant documentation on the behalf of Wyvonnia Lora, MD,as directed by  Wyvonnia Lora, MD while in the presence of Wyvonnia Lora, MD.  .I have reviewed the above documentation for accuracy and completeness, and I agree with the above. Johney Maine MD

## 2023-06-22 DIAGNOSIS — H401113 Primary open-angle glaucoma, right eye, severe stage: Secondary | ICD-10-CM | POA: Diagnosis not present

## 2023-07-15 ENCOUNTER — Emergency Department (HOSPITAL_BASED_OUTPATIENT_CLINIC_OR_DEPARTMENT_OTHER)

## 2023-07-15 ENCOUNTER — Other Ambulatory Visit: Payer: Self-pay

## 2023-07-15 ENCOUNTER — Emergency Department (HOSPITAL_BASED_OUTPATIENT_CLINIC_OR_DEPARTMENT_OTHER): Admission: EM | Admit: 2023-07-15 | Discharge: 2023-07-15 | Disposition: A | Discharge status: Home / Self Care

## 2023-07-15 ENCOUNTER — Encounter (HOSPITAL_BASED_OUTPATIENT_CLINIC_OR_DEPARTMENT_OTHER): Payer: Self-pay | Admitting: Emergency Medicine

## 2023-07-15 ENCOUNTER — Emergency Department (HOSPITAL_BASED_OUTPATIENT_CLINIC_OR_DEPARTMENT_OTHER): Admitting: Radiology

## 2023-07-15 DIAGNOSIS — M25552 Pain in left hip: Secondary | ICD-10-CM | POA: Diagnosis not present

## 2023-07-15 DIAGNOSIS — S7002XA Contusion of left hip, initial encounter: Secondary | ICD-10-CM | POA: Insufficient documentation

## 2023-07-15 DIAGNOSIS — W1839XA Other fall on same level, initial encounter: Secondary | ICD-10-CM | POA: Diagnosis not present

## 2023-07-15 DIAGNOSIS — M8580 Other specified disorders of bone density and structure, unspecified site: Secondary | ICD-10-CM | POA: Diagnosis not present

## 2023-07-15 DIAGNOSIS — M1612 Unilateral primary osteoarthritis, left hip: Secondary | ICD-10-CM | POA: Diagnosis not present

## 2023-07-15 DIAGNOSIS — R600 Localized edema: Secondary | ICD-10-CM | POA: Diagnosis not present

## 2023-07-15 DIAGNOSIS — M25452 Effusion, left hip: Secondary | ICD-10-CM | POA: Diagnosis not present

## 2023-07-15 MED ORDER — ACETAMINOPHEN 500 MG PO TABS
1000.0000 mg | ORAL_TABLET | Freq: Once | ORAL | Status: DC
  Filled 2023-07-15: qty 2

## 2023-07-15 NOTE — ED Provider Notes (Signed)
 [place] EMERGENCY DEPARTMENT AT [facility] Provider Note   CSN: [id number] Arrival date & time: 07/15/23  1406     History  No chief complaint on file.   Rebecca [name] is a 89 y.o. female.  89 year old female with past medical history of osteopenia and hyperlipidemia presenting to the emergency department today with left hip pain.  The patient fell yesterday.  She fell on her left hip.  This is a mechanical fall.  She did not hit her head or lose consciousness.  She is been having pain in the left hip since then.  She states that it hurts to move as well as when she walks on it.  She came to the ER today for further evaluation.  She does report a remote history of hip fracture which was treated nonoperatively in the past.  She denies any other injuries.        Home Medications Prior to Admission medications   Medication Sig Start Date End Date Taking? Authorizing Provider  acetaminophen (TYLENOL) 500 MG tablet Take 500-1,000 mg by mouth every 6 (six) hours as needed for mild pain or headache.    [provider]  ALPRAZolam (XANAX) 0.25 MG tablet TAKE 1 TABLET BY MOUTH AT BEDTIME AS NEEDED. DO NOT DRIVE FOR 8 HOURS AFTER TAKING AND STOP IF ANY FALLS UNTIL NEXT DOCTOR VISIT Patient not taking: Reported on 05/29/2023 05/25/23   Shelva Majestic, MD  atorvastatin (LIPITOR) 10 MG tablet Take 1 tablet (10 mg total) by mouth 3 (three) times a week. 10/05/22   Shelva Majestic, MD  Calcium Carb-Cholecalciferol (CALCIUM-VITAMIN D) 600-400 MG-UNIT TABS Take 1 tablet by mouth daily.    [provider]  Cholecalciferol (VITAMIN D3) 2000 UNITS TABS Take 1 tablet by mouth daily.    [provider]  cyclobenzaprine (FLEXERIL) 10 MG tablet Take 1 tablet (10 mg total) by mouth at bedtime as needed for muscle spasms. 09/28/22   Willow Ora, MD  diclofenac sodium (VOLTAREN) 1 % GEL Apply 2 g topically 4 (four) times daily. Patient taking differently: Apply 2  g topically 4 (four) times daily as needed (for pain). 08/09/17   Burchette, Elberta Fortis, MD  esomeprazole (NEXIUM) 20 MG capsule Take 20 mg by mouth 2 (two) times daily before a meal.    [provider]  estradiol (ESTRACE) 0.1 MG/GM vaginal cream Place 0.5 g vaginally See admin instructions. Place 0.5 g vaginally two to three times a week 12/13/21   [provider]  levothyroxine (SYNTHROID) 88 MCG tablet TAKE 1 TABLET(88 MCG) BY MOUTH DAILY BEFORE BREAKFAST 11/14/22   Shelva Majestic, MD  loperamide (IMODIUM) 2 MG capsule Take 1 capsule (2 mg total) by mouth 4 (four) times daily as needed for diarrhea or loose stools. 03/16/22   Kommor, Madison, MD  losartan (COZAAR) 100 MG tablet TAKE 1 TABLET(100 MG) BY MOUTH DAILY 01/09/23   Shelva Majestic, MD  metoCLOPramide (REGLAN) 5 MG tablet TAKE 1 TABLET BY MOUTH TWICE DAILY WITH MEALS 11/14/22   Shelva Majestic, MD  ondansetron (ZOFRAN-ODT) 4 MG disintegrating tablet Take 1 tablet (4 mg total) by mouth every 8 (eight) hours as needed for nausea or vomiting. 03/16/22   Kommor, Madison, MD  ROCKLATAN 0.02-0.005 % SOLN  05/18/23   [provider]  timolol (TIMOPTIC) 0.5 % ophthalmic solution Place 1 drop into both eyes 2 (two) times daily. 09/08/20   [provider]  vitamin B-12 (CYANOCOBALAMIN) 500 MCG  tablet Take 500 mcg by mouth every morning.    [provider]  vitamin C (ASCORBIC ACID) 500 MG tablet Take 500 mg by mouth daily.    [provider]  VYZULTA 0.024 % SOLN Place 1 drop into both eyes at bedtime.    [provider]  olmesartan (BENICAR) 20 MG tablet Take 20 mg by mouth daily.    07/28/11  [provider]      Allergies    Levaquin [levofloxacin], Sulfamethoxazole-trimethoprim, Lactose intolerance (gi), Tramadol hcl, Augmentin [amoxicillin-pot clavulanate], Ciprofloxacin, Rosuvastatin, Zosyn [piperacillin sod-tazobactam so], Adhesive [tape], Codeine, and Pneumococcal vaccine  polyvalent    Review of Systems   Review of Systems  Musculoskeletal:        Left hip pain  All other systems reviewed and are negative.   Physical Exam Updated Vital Signs BP (!) 173/86 (BP Location: Right Arm)   Pulse 60   Temp 97.6 F (36.4 C)   Resp 18   Wt 54.4 kg   SpO2 97%   BMI 21.95 kg/m  Physical Exam Vitals and nursing note reviewed.   Gen: NAD Eyes: PERRL, EOMI HEENT: no oropharyngeal swelling Neck: trachea midline Resp: clear to auscultation bilaterally Card: RRR, no murmurs, rubs, or gallops Abd: nontender, nondistended Extremities: no calf tenderness, no edema, the patient is tender over the proximal femur on the left with normal range of motion noted, no obvious deformities Vascular: 2+ radial pulses bilaterally, 2+ DP pulses bilaterally Skin: no rashes Psyc: acting appropriately   ED Results / Procedures / Treatments   Labs (all labs ordered are listed, but only abnormal results are displayed) Labs Reviewed  CBC  BASIC METABOLIC PANEL WITH GFR    EKG None  Radiology CT Hip Left Wo Contrast Result Date: 07/15/2023 CLINICAL DATA:  Trauma with fractures suspected. EXAM: CT OF THE LEFT HIP WITHOUT CONTRAST TECHNIQUE: Multidetector CT imaging of the left hip was performed according to the standard protocol. Multiplanar CT image reconstructions were also generated. RADIATION DOSE REDUCTION: This exam was performed according to the departmental dose-optimization program which includes automated exposure control, adjustment of the mA and/or kV according to patient size and/or use of iterative reconstruction technique. COMPARISON:  X-ray same day FINDINGS: Bones/Joint/Cartilage The bones are osteopenic. There is no acute fracture or dislocation identified. There are moderate degenerative changes of the left hip with joint space narrowing at no focal osseous lesion identified. Image 80 a small joint effusion. Ligaments Suboptimally assessed by CT. Muscles and  Tendons There is some intramuscular edema in the left gluteus musculature. No focal hematoma. Soft tissues There is subcutaneous edema overlying the left gluteus musculature. IMPRESSION: 1. No acute fracture or dislocation. 2. Moderate degenerative changes of the left hip. 3. Small joint effusion. 4. Intramuscular edema in the left gluteus musculature and subcutaneous edema overlying the left gluteus musculature. Electronically Signed   By: Tyron Gallon M.D.   On: 07/15/2023 17:00   DG Hip Unilat With Pelvis 2-3 Views Left Result Date: 07/15/2023 CLINICAL DATA:  fall, left hip pain EXAM: DG HIP (WITH OR WITHOUT PELVIS) 2-3V LEFT COMPARISON:  October 21, 2022, October 13, 2022 FINDINGS: Diffuse osteopenia. No acute fracture or dislocation. Mild joint space loss of the hip, unchanged. Mild narrowing of the pubic symphysis. Likely pessary device overlying the pelvis. Multilevel degenerative disc disease of the spine. IMPRESSION: While no acute, displaced fracture or dislocation was visualized, underlying osteopenia decreases the sensitivity for nondisplaced fracture using plain radiography. If the patient is  unable to bear weight, or there is high clinical suspicion, a follow-up CT should be considered for further evaluation. Electronically Signed   By: Rance Burrows M.D.   On: 07/15/2023 15:31    Procedures Procedures    Medications Ordered in ED Medications  acetaminophen (TYLENOL) tablet 1,000 mg (1,000 mg Oral Patient Refused/Not Given 07/15/23 1600)    ED Course/ Medical Decision Making/ A&P                                 Medical Decision Making 89 year old female with past medical history of hyperlipidemia and osteopenia presenting to the emergency department today with left hip pain.  Her x-ray interpreted by me shows possible irregularity of the femoral neck.  There is no definitive displaced fracture fracture.  CT scan is ordered in addition to basic labs in the event the patient does require  admission we will give patient Tylenol for pain.  Will reevaluate for ultimate disposition.  The patient CT scan does not show any acute fractures.  There is a joint effusion and hematoma.  The patient encouraged to follow-up with her orthopedist as needed.  She is discharged with return precautions.  Amount and/or Complexity of Data Reviewed Labs: ordered. Radiology: ordered.  Risk OTC drugs.           Final Clinical Impression(s) / ED Diagnoses Final diagnoses:  Contusion of left hip, initial encounter    Rx / DC Orders ED Discharge Orders     None         Carin Charleston, MD 07/15/23 [phone]

## 2023-07-15 NOTE — ED Triage Notes (Signed)
 Pt was stepping down from a high curb and landed on her left hip. Pt was able to get up and drive home. Pt requesting xray.

## 2023-07-15 NOTE — Discharge Instructions (Signed)
 Please take Tylenol as needed for pain.  You may also try Voltaren gel.  Please schedule a follow-up appointment with your orthopedic surgeon if your symptoms persist.  Return to the ER for worsening symptoms.

## 2023-07-15 NOTE — ED Notes (Signed)
 Pt ambulatory to restroom with steady gait.

## 2023-07-15 NOTE — ED Notes (Signed)
 Pt refusing labwork and Tylenol. EDP notified and aware.

## 2023-07-15 NOTE — ED Notes (Signed)
 Spoke with radiology to come take pt to xray.

## 2023-07-19 ENCOUNTER — Encounter: Payer: Self-pay | Admitting: Physician Assistant

## 2023-07-19 ENCOUNTER — Ambulatory Visit (INDEPENDENT_AMBULATORY_CARE_PROVIDER_SITE_OTHER): Admitting: Physician Assistant

## 2023-07-19 DIAGNOSIS — M1711 Unilateral primary osteoarthritis, right knee: Secondary | ICD-10-CM | POA: Insufficient documentation

## 2023-07-19 DIAGNOSIS — M170 Bilateral primary osteoarthritis of knee: Secondary | ICD-10-CM | POA: Insufficient documentation

## 2023-07-19 MED ORDER — LIDOCAINE HCL 1 % IJ SOLN
3.0000 mL | INTRAMUSCULAR | Status: AC | PRN
  Administered 2023-07-19: 3 mL

## 2023-07-19 MED ORDER — METHYLPREDNISOLONE ACETATE 40 MG/ML IJ SUSP
40.0000 mg | INTRAMUSCULAR | Status: AC | PRN
  Administered 2023-07-19: 40 mg via INTRA_ARTICULAR

## 2023-07-19 NOTE — Progress Notes (Signed)
 Office Visit Note   Patient: Rebecca [name]           Date of Birth: Sep 20, 1932           MRN: [id number] Visit Date: 07/19/2023              Requested by: Shelva Majestic, MD [address] [place],  [place] [address] PCP: Shelva Majestic, MD  No chief complaint on file.     HPI: Kadi is a very pleasant 89 year old woman who have seen in the past for right knee arthritis.  She comes in today follow-up from the emergency room.  She said approximately 5 days ago she was stepping up on a curb and it was higher than she thought.  She fell onto her left hip.  She was evaluated in the emergency room both with plain x-ray and CT scan.  There is no evidence of any fracture.  She said was bothering her most today is her right knee.  She has had steroid injections in the past but none recently  Assessment & Plan: Visit Diagnoses:  1. Unilateral primary osteoarthritis, right knee   Left hip pain  Plan: She is doing better with regards to her hip.  Can use a cane or crutch.  Would like to recheck her in 3 weeks.  With regards to her right knee will go forward with a steroid injection today  Follow-Up Instructions: Return in about 3 weeks (around 08/09/2023).   Ortho Exam  Patient is alert, oriented, no adenopathy, well-dressed, normal affect, normal respiratory effort. Examination of her left hip she has no pain with manipulation of her hip she is neurovascular intact she is able to bend and extend her ankle and her leg with minimal pain. Right knee no erythema no effusion she does have grinding with range of motion.  Previous x-rays show advanced tricompartmental arthritis  Imaging: No results found. No images are attached to the encounter.  Labs: Lab Results  Component Value Date   HGBA1C 5.9 10/04/2022   HGBA1C 5.6 07/26/2006   ESRSEDRATE 22 02/10/2021   REPTSTATUS 03/17/2022 FINAL 03/16/2022   CULT (A) 03/16/2022    <10,000 COLONIES/mL INSIGNIFICANT GROWTH Performed  at [facility], [address], [place], [place] [address]    LABORGA NO GROWTH 06/10/2016     Lab Results  Component Value Date   ALBUMIN 4.3 06/13/2023   ALBUMIN 4.3 05/29/2023   ALBUMIN 4.1 12/13/2022    Lab Results  Component Value Date   MG 1.8 03/28/2021   Lab Results  Component Value Date   VD25OH 47.92 05/29/2023   VD25OH 42.96 04/05/2022   VD25OH 45 03/20/2020    No results found for: "PREALBUMIN"    Latest Ref Rng & Units 06/13/2023    1:03 PM 05/29/2023    1:26 PM 12/13/2022   12:51 PM  CBC EXTENDED  WBC 4.0 - 10.5 K/uL 19.8  22.9 Repeated and verified X2.  19.4   RBC 3.87 - 5.11 MIL/uL 3.54  3.59  3.40   Hemoglobin 12.0 - 15.0 g/dL 86.5  78.4  69.6   HCT 36.0 - 46.0 % 35.3  36.6  33.6   Platelets 150 - 400 K/uL 129  120.0  121   NEUT# 1.7 - 7.7 K/uL 2.1  2.2  1.6   Lymph# 0.7 - 4.0 K/uL 14.8  20.1  17.3      There is no height or weight on file to calculate BMI.  Orders:  No orders of the defined types were placed in this encounter.  No orders of the defined types were placed in this encounter.    Procedures: Large Joint Inj on 07/19/2023 2:00 PM Indications: pain and diagnostic evaluation Details: 25 G 1.5 in needle, anterolateral approach  Arthrogram: No  Medications: 40 mg methylPREDNISolone acetate 40 MG/ML; 3 mL lidocaine 1 % Outcome: tolerated well, no immediate complications Procedure, treatment alternatives, risks and benefits explained, specific risks discussed. Consent was given by the patient.    Clinical Data: No additional findings.  ROS:  All other systems negative, except as noted in the HPI. Review of Systems  Objective: Vital Signs: There were no vitals taken for this visit.  Specialty Comments:  No specialty comments available.  PMFS History: Patient Active Problem List   Diagnosis Date Noted  . Unilateral primary osteoarthritis, right knee 07/19/2023  . B12 deficiency 10/04/2022  . Myalgia 04/05/2022  .  CLL (chronic lymphocytic leukemia) (HCC) 09/30/2021  . Cholangitis 03/26/2021  . Cholelithiasis 03/25/2021  . Elevated LFTs 03/25/2021  . Chronic hyponatremia 03/25/2021  . Bilateral primary osteoarthritis of knee 06/11/2019  . Pain in left shoulder 05/22/2019  . Vitamin D deficiency 12/25/2013  . SPINAL STENOSIS, LUMBAR 03/09/2010  . Osteoporosis 03/09/2010  . MIGRAINE, OPHTHALMIC 10/29/2009  . ADENOMATOUS COLONIC POLYP 06/10/2009  . History of iron deficiency 05/06/2009  . Carotid artery disease (HCC) 07/28/2008  . Allergic rhinitis 03/20/2008  . DYSPHAGIA 03/12/2008  . Ganglion of tendon sheath 08/17/2007  . Hypothyroidism 11/15/2006  . Hyperlipidemia 11/07/2006  . Essential hypertension 10/18/2006  . GERD 10/18/2006  . DIVERTICULOSIS, COLON 10/18/2006   Past Medical History:  Diagnosis Date  . Anemia   . Anxiety   . Bifascicular block   . Diverticulitis   . Diverticulosis   . Gastroparesis   . GERD (gastroesophageal reflux disease)   . Glaucoma   . Hemorrhoids   . Hyperlipidemia   . Hypertension   . Hypertension   . IBS (irritable bowel syndrome)   . Rectal ulcer   . SCOLIOSIS, LUMBAR SPINE 03/09/2010   Qualifier: Diagnosis of  By: Larrie Po MD, Wilmon Hashimoto   . Segmental colitis (HCC)   . Tubular adenoma of colon 05/2009    Family History  Problem Relation Age of Onset  . Diabetes Mother   . Heart disease Mother   . Hyperlipidemia Mother   . Hypertension Mother   . Anuerysm Father        abdominal- heavy smoker  . Heart disease Father   . Hypertension Father   . Colon polyps Sister   . Lung cancer Sister        smoker  . Colon polyps Brother   . Diabetes Brother   . Hyperlipidemia Brother   . Hypertension Brother   . Heart disease Brother   . Colon cancer Paternal Grandmother        with possible stomach cancer  . Migraines Daughter   . Kidney Stones Daughter   . Colon polyps Daughter   . Breast cancer Daughter   . Cancer Daughter        salivary  .  Colon cancer Maternal Aunt        Rectal cancer  . Crohn's disease Neg Hx   . Pancreatic cancer Neg Hx     Past Surgical History:  Procedure Laterality Date  . APPENDECTOMY    . ARCUATE KERATECTOMY    . BILIARY DILATION  03/27/2021   Procedure: BILIARY  DILATION;  Surgeon: Janel Medford, MD;  Location: [facility];  Service: Gastroenterology;;  . CATARACT EXTRACTION, BILATERAL  2015  . CHOLECYSTECTOMY N/A 03/28/2021   Procedure: LAPAROSCOPIC CHOLECYSTECTOMY;  Surgeon: Joyce Nixon, MD;  Location: [facility];  Service: General;  Laterality: N/A;  . double pallital tori bone removed     . ERCP N/A 03/27/2021   Procedure: ENDOSCOPIC RETROGRADE CHOLANGIOPANCREATOGRAPHY (ERCP);  Surgeon: Janel Medford, MD;  Location: [facility];  Service: Gastroenterology;  Laterality: N/A;  . HERNIA REPAIR     umbilical  . KNEE SURGERY Bilateral   . morton's neuroma on rt foot and platar facial release    . MOUTH SURGERY     "bone shaved off roof of mouth"  . REMOVAL OF STONES  03/27/2021   Procedure: REMOVAL OF STONES;  Surgeon: Janel Medford, MD;  Location: [facility];  Service: Gastroenterology;;  . SALIVARY GLAND SURGERY     removal  . SPHINCTEROTOMY  03/27/2021   Procedure: [address];  Surgeon: Janel Medford, MD;  Location: [facility];  Service: Gastroenterology;;  . TONSILLECTOMY     Social History   Occupational History  . Occupation: Print production planner    Comment: retired  Tobacco Use  . Smoking status: Never  . Smokeless tobacco: Never  Vaping Use  . Vaping status: Never Used  Substance and Sexual Activity  . Alcohol use: Yes    Alcohol/week: 0.0 standard drinks of alcohol    Comment: rare  . Drug use: No  . Sexual activity: Not Currently

## 2023-07-20 ENCOUNTER — Ambulatory Visit: Payer: Self-pay

## 2023-07-20 NOTE — Telephone Encounter (Signed)
  Chief Complaint: possible allergic reaction Symptoms: flush face,  Frequency: today Pertinent Negatives: Patient denies swelling or sob Disposition: [] ED /[x] Urgent Care (no appt availability in office) / [] Appointment(In office/virtual)/ []  [facility]/ [x] Home Care/ [] Refused Recommended Disposition /[] [facility]/ [x]  Follow-up with PCP   Additional Notes: patient states that she has eye surgery as few weeks ago and is on steroid eye drops. States that yesterday she got a steroid shot in her knee. States this morning she had a headache and didn't feel good, then she felt flush and her face was red. States BP 180/89 BP and P59 and BP is normally around 135-140 but she did take her BP medication late this morning.  States that the flush ed and redness in her face has gotten better and headache has subsided.  States that she probably worked herself up before taking her BP. Instructed pt to call her eye surgeon to make them aware of the situation as well and Disposition UC.    Copied from CRM [id number]. Topic: Clinical - Red Word Triage >> Jul 20, 2023  2:44 PM Antwanette L wrote: Red Word that prompted transfer to Nurse Triage: Patient got a cortisone shot in her right knee on 4/16. Patient had eye surgery a few weeks and is using  Prednisolone eye drops. Patient thinks she is having an allergic reaction because her face is flush. Reason for Disposition  [1] Caller has URGENT medicine question about med that PCP or specialist prescribed AND [2] triager unable to answer question  Answer Assessment - Initial Assessment Questions 1. NAME of MEDICINE: "What medicine(s) are you calling about?"     Steroid shot 2. QUESTION: "What is your question?" (e.g., double dose of medicine, side effect)     Possible reaction between steroid shot and eye drops 3. PRESCRIBER: "Who prescribed the medicine?" Reason: if prescribed by specialist, call should be referred to that group.     Ortho  care, [facility] 4. SYMPTOMS: "Do you have any symptoms?" If Yes, ask: "What symptoms are you having?"  "How bad are the symptoms (e.g., mild, moderate, severe)     headache flush and red face, and elevated BP  Protocols used: Medication Question Call-A-AH

## 2023-07-20 NOTE — Telephone Encounter (Signed)
FYI, see Triage note. 

## 2023-07-24 DIAGNOSIS — D1801 Hemangioma of skin and subcutaneous tissue: Secondary | ICD-10-CM | POA: Diagnosis not present

## 2023-07-24 DIAGNOSIS — L905 Scar conditions and fibrosis of skin: Secondary | ICD-10-CM | POA: Diagnosis not present

## 2023-07-24 DIAGNOSIS — L821 Other seborrheic keratosis: Secondary | ICD-10-CM | POA: Diagnosis not present

## 2023-07-24 DIAGNOSIS — Z85828 Personal history of other malignant neoplasm of skin: Secondary | ICD-10-CM | POA: Diagnosis not present

## 2023-07-24 NOTE — Telephone Encounter (Signed)
 Ok glad trending down- hope continues to improve- prefer under 140 in long run

## 2023-07-24 NOTE — Telephone Encounter (Signed)
 Per patient her later bp was 140 / 60 Patient reports her BP was up due to taking prednisone  and she is feeling better.

## 2023-07-24 NOTE — Telephone Encounter (Signed)
 Team can you check on her and make sure blood pressure went back down?

## 2023-08-03 DIAGNOSIS — Z1231 Encounter for screening mammogram for malignant neoplasm of breast: Secondary | ICD-10-CM | POA: Diagnosis not present

## 2023-08-09 ENCOUNTER — Telehealth: Payer: Self-pay | Admitting: Physician Assistant

## 2023-08-09 NOTE — Telephone Encounter (Signed)
 Pt called requesting to submit for gel injection. Sh states it has been 6 months. Please call pt when approved at [phone].

## 2023-08-09 NOTE — Telephone Encounter (Signed)
 VOB submitted for Orthovisc, bilateral knee  Talked with patient and advised her that her next available gel injection would need to be after 09/20/2023. Patient voiced that she understands and appts.have been scheduled.

## 2023-08-10 ENCOUNTER — Ambulatory Visit: Admitting: Physician Assistant

## 2023-08-16 DIAGNOSIS — N816 Rectocele: Secondary | ICD-10-CM | POA: Diagnosis not present

## 2023-08-16 DIAGNOSIS — Z4689 Encounter for fitting and adjustment of other specified devices: Secondary | ICD-10-CM | POA: Diagnosis not present

## 2023-08-16 DIAGNOSIS — N8111 Cystocele, midline: Secondary | ICD-10-CM | POA: Diagnosis not present

## 2023-08-18 ENCOUNTER — Telehealth: Payer: Self-pay

## 2023-08-18 NOTE — Telephone Encounter (Signed)
 Patient states she is having to give herself prednisone  eyedrops and she is scheduled coming up for gel injection and wanted to make sure that was okay and that you knew she was getting those drops I told her I did not think this would be an issue but that I would let you know

## 2023-08-25 ENCOUNTER — Encounter: Payer: Self-pay | Admitting: Family Medicine

## 2023-08-25 ENCOUNTER — Ambulatory Visit (INDEPENDENT_AMBULATORY_CARE_PROVIDER_SITE_OTHER): Admitting: Family Medicine

## 2023-08-25 VITALS — BP 110/60 | HR 50 | Temp 97.3°F | Ht 62.0 in | Wt 120.0 lb

## 2023-08-25 DIAGNOSIS — Z131 Encounter for screening for diabetes mellitus: Secondary | ICD-10-CM

## 2023-08-25 DIAGNOSIS — R2689 Other abnormalities of gait and mobility: Secondary | ICD-10-CM

## 2023-08-25 DIAGNOSIS — E559 Vitamin D deficiency, unspecified: Secondary | ICD-10-CM | POA: Diagnosis not present

## 2023-08-25 DIAGNOSIS — E039 Hypothyroidism, unspecified: Secondary | ICD-10-CM

## 2023-08-25 DIAGNOSIS — M48061 Spinal stenosis, lumbar region without neurogenic claudication: Secondary | ICD-10-CM

## 2023-08-25 DIAGNOSIS — K2100 Gastro-esophageal reflux disease with esophagitis, without bleeding: Secondary | ICD-10-CM

## 2023-08-25 DIAGNOSIS — R202 Paresthesia of skin: Secondary | ICD-10-CM | POA: Diagnosis not present

## 2023-08-25 DIAGNOSIS — E538 Deficiency of other specified B group vitamins: Secondary | ICD-10-CM | POA: Diagnosis not present

## 2023-08-25 DIAGNOSIS — R7303 Prediabetes: Secondary | ICD-10-CM

## 2023-08-25 DIAGNOSIS — W19XXXA Unspecified fall, initial encounter: Secondary | ICD-10-CM | POA: Diagnosis not present

## 2023-08-25 MED ORDER — METOCLOPRAMIDE HCL 5 MG PO TABS: ORAL_TABLET | ORAL | 3 refills | Status: AC

## 2023-08-25 MED ORDER — LOSARTAN POTASSIUM 100 MG PO TABS: 100.0000 mg | ORAL_TABLET | Freq: Every day | ORAL | 3 refills | Status: DC

## 2023-08-25 MED ORDER — ALPRAZOLAM 0.25 MG PO TABS: ORAL_TABLET | ORAL | 1 refills | Status: DC

## 2023-08-25 NOTE — Patient Instructions (Addendum)
 Please stop by lab before you go If you have mychart- we will send your results within 3 business days of us  receiving them.  If you do not have mychart- we will call you about results within 5 business days of us  receiving them.  *please also note that you will see labs on mychart as soon as they post. I will later go in and write notes on them- will say "notes from Dr. Arlene Ben"   If no clear answers on labs lets schedule follow up with neurosurgery  We have placed a referral for you today to [facility] - please call their # if you do not hear within a week   Recommended follow up: Return for next already scheduled visit or sooner if needed.

## 2023-08-25 NOTE — Progress Notes (Signed)
 Phone [phone] In person visit   Subjective:   Rebecca [name] is a 89 y.o. year old very pleasant female patient who presents for/with See problem oriented charting Chief Complaint  Patient presents with   Tingling    In both feet; been going on for a while but getting more frequent; little to no numbness; no pain    Past Medical History-  Patient Active Problem List   Diagnosis Date Noted   CLL (chronic lymphocytic leukemia) (HCC) 09/30/2021    Priority: High   Carotid artery disease (HCC) 07/28/2008    Priority: High   B12 deficiency 10/04/2022    Priority: Medium    Myalgia 04/05/2022    Priority: Medium    Chronic hyponatremia 03/25/2021    Priority: Medium    Vitamin D  deficiency 12/25/2013    Priority: Medium    SPINAL STENOSIS, LUMBAR 03/09/2010    Priority: Medium    Osteoporosis 03/09/2010    Priority: Medium    MIGRAINE, OPHTHALMIC 10/29/2009    Priority: Medium    Hypothyroidism 11/15/2006    Priority: Medium    Hyperlipidemia 11/07/2006    Priority: Medium    Essential hypertension 10/18/2006    Priority: Medium    GERD 10/18/2006    Priority: Medium    Cholangitis 03/26/2021    Priority: Low   Cholelithiasis 03/25/2021    Priority: Low   Elevated LFTs 03/25/2021    Priority: Low   Bilateral primary osteoarthritis of knee 06/11/2019    Priority: Low   Pain in left shoulder 05/22/2019    Priority: Low   ADENOMATOUS COLONIC POLYP 06/10/2009    Priority: Low   History of iron  deficiency 05/06/2009    Priority: Low   Allergic rhinitis 03/20/2008    Priority: Low   DYSPHAGIA 03/12/2008    Priority: Low   Ganglion of tendon sheath 08/17/2007    Priority: Low   DIVERTICULOSIS, COLON 10/18/2006    Priority: Low   Unilateral primary osteoarthritis, right knee 07/19/2023    Medications- reviewed and updated Current Outpatient Medications  Medication Sig Dispense Refill   acetaminophen  (TYLENOL ) 500 MG tablet Take 500-1,000 mg by mouth  every 6 (six) hours as needed for mild pain or headache.     atorvastatin  (LIPITOR) 10 MG tablet Take 1 tablet (10 mg total) by mouth 3 (three) times a week. 39 tablet 3   Calcium  Carb-Cholecalciferol (CALCIUM -VITAMIN D ) 600-400 MG-UNIT TABS Take 1 tablet by mouth daily.     Cholecalciferol (VITAMIN D3) 2000 UNITS TABS Take 1 tablet by mouth daily.     diclofenac  sodium (VOLTAREN ) 1 % GEL Apply 2 g topically 4 (four) times daily. (Patient taking differently: Apply 2 g topically 4 (four) times daily as needed (for pain).) 1 Tube 1   esomeprazole  (NEXIUM ) 20 MG capsule Take 20 mg by mouth 2 (two) times daily before a meal.     estradiol (ESTRACE) 0.1 MG/GM vaginal cream Place 0.5 g vaginally See admin instructions. Place 0.5 g vaginally two to three times a week     levothyroxine  (SYNTHROID ) 88 MCG tablet TAKE 1 TABLET(88 MCG) BY MOUTH DAILY BEFORE BREAKFAST 90 tablet 3   loperamide  (IMODIUM ) 2 MG capsule Take 1 capsule (2 mg total) by mouth 4 (four) times daily as needed for diarrhea or loose stools. 12 capsule 0   ondansetron  (ZOFRAN -ODT) 4 MG disintegrating tablet Take 1 tablet (4 mg total) by mouth every 8 (eight) hours as needed for nausea or vomiting. 20 tablet  0   ROCKLATAN 0.02-0.005 % SOLN      timolol  (TIMOPTIC ) 0.5 % ophthalmic solution Place 1 drop into both eyes 2 (two) times daily.     vitamin B-12 (CYANOCOBALAMIN ) 500 MCG tablet Take 500 mcg by mouth every morning.     vitamin C (ASCORBIC ACID) 500 MG tablet Take 500 mg by mouth daily.     VYZULTA 0.024 % SOLN Place 1 drop into both eyes at bedtime.     ALPRAZolam  (XANAX ) 0.25 MG tablet TAKE 1 TABLET BY MOUTH AT BEDTIME AS NEEDED. DO NOT DRIVE FOR 8 HOURS AFTER TAKING AND STOP IF ANY FALLS UNTIL NEXT DOCTOR VISIT 20 tablet 1   losartan  (COZAAR ) 100 MG tablet Take 1 tablet (100 mg total) by mouth daily. 90 tablet 3   metoCLOPramide  (REGLAN ) 5 MG tablet TAKE 1 TABLET BY MOUTH TWICE DAILY WITH MEALS 90 tablet 3   No current  facility-administered medications for this visit.     Objective:  BP 110/60   Pulse (!) 50   Temp (!) 97.3 F (36.3 C)   Ht 5\' 2"  (1.575 m)   Wt 120 lb (54.4 kg)   SpO2 99%   BMI 21.95 kg/m  Gen: NAD, resting comfortably CV: Mildly bradycardic but regular no murmurs rubs or gallops Lungs: CTAB no crackles, wheeze, rhonchi Ext: no edema, 2+ DP and PT pulses-back to gross sensation bilaterally Skin: warm, dry Neuro: Good strength in legs   -Daughter [place] Jest present with her today    Assessment and Plan   # Paresthesias S: Patient complains of tingling in both feet.  Has been going on for couple of months but seems to be more frequent.  Denies any pain . Started out light and has worsened- feels numb sensation. Mildly swollen sensation.  -Of note she does have a history of lumbar stenosis with history of sciatica and has seen neurosurgery in the past . Thinks about 2 years ago from last injection but has had to have steroids for the eye and wasn't sure if could have injection.  - some sciatica down right leg- better with movement but numbness in feet -did have a fall 4 weeks ago- went to step down from curb and raining and had umbrella and lost balance and fell backwards onto back around 2 pm  Lab Results  Component Value Date   HGBA1C 5.9 10/04/2022   HGBA1C 5.6 07/26/2006   Lab Results  Component Value Date   TSH 2.84 05/29/2023   Lab Results  Component Value Date   VITAMINB12 >1500 (H) 10/04/2022    A/P: paresthesias in both feet inpatient with long term history of lumbar stenosis. Tingling in right foot is worse and right leg is the leg with worsened sciatica- and most injections are for the right. We will start by checking labs including a1c, TSH, and B12- if these remain normal id like for her to schedule follow up with Dr. Ellery Guthrie for his opinion with history of spinal stenosis. Did have a fall within last month but not having acute back pain and we held off on  imaging today - already ahd hip CT and x-ray at [facility] about a month ago and reassuring.   # Prior fall-with profile we opted to refer her back to physical therapy in light of her spinal stenosis and reported imbalance issues for gait and balance training-she is using a crutch instead of her cane and wants to get back to cane.  I encouraged a walker   #  Chronic hyponatremia reports low for years-labs 05/29/2023 with no significant change.  We could consider morning cortisol in the future if needed/desired-she did not drop down at last labs  % CLL- follows with Dr. Deneise Finlay on most recent labs  #hypertension S: medication: losartan  100mg  A/P: Controlled. Continue current medications.     #hypothyroidism S: compliant On thyroid  medication-levothyroxine  88 mcg Lab Results  Component Value Date   TSH 2.84 05/29/2023  A/P: Hopefuly stable or improved- update TSH with labs today. Continue current meds for now.   # B12 deficiency S: Current treatment/medication (oral vs. IM): oral b12 -prior Weekly injections for 1 month required in January  2024 A/P: Hopefuly stable or improved- update B12 with labs today. Continue current meds for now.  # Vitamin D  deficiency-update levels today-I believe she is on at least 1000 units a day   Recommended follow up: Return for next already scheduled visit or sooner if needed. Future Appointments  Date Time Provider Department Center  09/28/2023  1:00 PM Persons, Norma Beckers, [place] [facility] None  10/05/2023  1:00 PM Persons, Norma Beckers, [place] [facility] None  10/12/2023  1:00 PM Persons, Norma Beckers, [place] [facility] None  11/28/2023 11:00 AM Almira Jaeger, MD [facility]  12/19/2023  1:00 PM [facility] None  12/19/2023  1:30 PM Frankie Israel, MD [facility] None    Lab/Order associations:   ICD-10-CM   1. Paresthesias  R20.2     2. Vitamin D  deficiency  E55.9 VITAMIN D  25 Hydroxy (Vit-D Deficiency, Fractures)    VITAMIN D  25 Hydroxy (Vit-D  Deficiency, Fractures)    3. B12 deficiency  E53.8 Vitamin B12    Vitamin B12    4. Screening for diabetes mellitus  Z13.1 Hemoglobin A1c    Hemoglobin A1c    5. Prediabetes  R73.03 Hemoglobin A1c    Hemoglobin A1c    6. Hypothyroidism, unspecified type  E03.9 TSH    TSH    7. Fall, initial encounter  W19.XXXA Ambulatory referral to Physical Therapy    8. Imbalance  R26.89 Ambulatory referral to Physical Therapy    9. SPINAL STENOSIS, LUMBAR  M48.061 Ambulatory referral to Physical Therapy    10. Gastroesophageal reflux disease with esophagitis, unspecified whether hemorrhage  K21.00 metoCLOPramide  (REGLAN ) 5 MG tablet      Meds ordered this encounter  Medications   metoCLOPramide  (REGLAN ) 5 MG tablet    Sig: TAKE 1 TABLET BY MOUTH TWICE DAILY WITH MEALS    Dispense:  90 tablet    Refill:  3   losartan  (COZAAR ) 100 MG tablet    Sig: Take 1 tablet (100 mg total) by mouth daily.    Dispense:  90 tablet    Refill:  3   ALPRAZolam  (XANAX ) 0.25 MG tablet    Sig: TAKE 1 TABLET BY MOUTH AT BEDTIME AS NEEDED. DO NOT DRIVE FOR 8 HOURS AFTER TAKING AND STOP IF ANY FALLS UNTIL NEXT DOCTOR VISIT    Dispense:  20 tablet    Refill:  1    Return precautions advised.  Clarisa Crooked, MD

## 2023-08-26 LAB — HEMOGLOBIN A1C
Hgb A1c MFr Bld: 6 % — ABNORMAL HIGH (ref <5.7)
Mean Plasma Glucose: 126 mg/dL
eAG (mmol/L): 7 mmol/L

## 2023-08-26 LAB — VITAMIN B12: Vitamin B-12: >2000 pg/mL — ABNORMAL HIGH (ref 200–1100)

## 2023-08-26 LAB — VITAMIN D 25 HYDROXY (VIT D DEFICIENCY, FRACTURES): Vit D, 25-Hydroxy: 45 ng/mL (ref 30–100)

## 2023-08-26 LAB — TSH: TSH: 1.15 m[IU]/L (ref 0.40–4.50)

## 2023-08-29 ENCOUNTER — Ambulatory Visit: Payer: Self-pay | Admitting: Family Medicine

## 2023-09-05 DIAGNOSIS — H401113 Primary open-angle glaucoma, right eye, severe stage: Secondary | ICD-10-CM | POA: Diagnosis not present

## 2023-09-05 DIAGNOSIS — H401122 Primary open-angle glaucoma, left eye, moderate stage: Secondary | ICD-10-CM | POA: Diagnosis not present

## 2023-09-05 DIAGNOSIS — H52203 Unspecified astigmatism, bilateral: Secondary | ICD-10-CM | POA: Diagnosis not present

## 2023-09-12 DIAGNOSIS — Z23 Encounter for immunization: Secondary | ICD-10-CM | POA: Diagnosis not present

## 2023-09-13 DIAGNOSIS — N644 Mastodynia: Secondary | ICD-10-CM | POA: Diagnosis not present

## 2023-09-20 ENCOUNTER — Ambulatory Visit: Admitting: Physical Therapy

## 2023-09-25 ENCOUNTER — Other Ambulatory Visit: Payer: Self-pay

## 2023-09-25 DIAGNOSIS — M170 Bilateral primary osteoarthritis of knee: Secondary | ICD-10-CM

## 2023-09-27 DIAGNOSIS — N762 Acute vulvitis: Secondary | ICD-10-CM | POA: Diagnosis not present

## 2023-09-27 DIAGNOSIS — N8111 Cystocele, midline: Secondary | ICD-10-CM | POA: Diagnosis not present

## 2023-09-27 DIAGNOSIS — R300 Dysuria: Secondary | ICD-10-CM | POA: Diagnosis not present

## 2023-09-28 ENCOUNTER — Ambulatory Visit: Admitting: Physician Assistant

## 2023-10-03 DIAGNOSIS — N760 Acute vaginitis: Secondary | ICD-10-CM | POA: Diagnosis not present

## 2023-10-03 DIAGNOSIS — Z4689 Encounter for fitting and adjustment of other specified devices: Secondary | ICD-10-CM | POA: Diagnosis not present

## 2023-10-03 DIAGNOSIS — R102 Pelvic and perineal pain: Secondary | ICD-10-CM | POA: Diagnosis not present

## 2023-10-03 DIAGNOSIS — N8110 Cystocele, unspecified: Secondary | ICD-10-CM | POA: Diagnosis not present

## 2023-10-04 ENCOUNTER — Ambulatory Visit: Admitting: Physician Assistant

## 2023-10-04 ENCOUNTER — Ambulatory Visit

## 2023-10-05 ENCOUNTER — Ambulatory Visit: Admitting: Physician Assistant

## 2023-10-05 ENCOUNTER — Encounter: Payer: Self-pay | Admitting: Physician Assistant

## 2023-10-05 DIAGNOSIS — M170 Bilateral primary osteoarthritis of knee: Secondary | ICD-10-CM | POA: Diagnosis not present

## 2023-10-05 MED ORDER — HYALURONAN 30 MG/2ML IX SOSY
30.0000 mg | PREFILLED_SYRINGE | INTRA_ARTICULAR | Status: AC | PRN
  Administered 2023-10-05: 30 mg via INTRA_ARTICULAR

## 2023-10-05 MED ORDER — HYALURONAN 30 MG/2ML IX SOSY
30.0000 mg | PREFILLED_SYRINGE | INTRA_ARTICULAR | Status: AC | PRN
Start: 2023-10-05 — End: 2023-10-05
  Administered 2023-10-05: 30 mg via INTRA_ARTICULAR

## 2023-10-05 NOTE — Progress Notes (Signed)
 Office Visit Note   Patient: Rebecca [name]           Date of Birth: [birth year]           MRN: [id number] Visit Date: 10/05/2023              Requested by: Rebecca Garnette KIDD, MD [address] [place],  [place] [address] PCP: Rebecca Garnette KIDD, MD      HPI: Rebecca [name] is a pleasant 89 year old woman with a history of osteoarthritis of her knees.  Comes in today for her first in series of bilateral Orthovisc injections  Assessment & Plan: Visit Diagnoses:  1. Primary osteoarthritis of both knees     Plan: Will go forward with injections today should follow-up in 1 week  Follow-Up Instructions: Return in about 1 week (around 10/12/2023).   Ortho Exam  Patient is alert, oriented, no adenopathy, well-dressed, normal affect, normal respiratory effort. Bilateral knees no effusion no erythema compartments and soft and compressible she is neurovascular intact    Imaging: No results found. No images are attached to the encounter.  Labs: Lab Results  Component Value Date   HGBA1C 6.0 (H) 08/25/2023   HGBA1C 5.9 10/04/2022   HGBA1C 5.6 07/26/2006   ESRSEDRATE 22 02/10/2021   REPTSTATUS 03/17/2022 FINAL 03/16/2022   CULT (A) 03/16/2022    <10,000 COLONIES/mL INSIGNIFICANT GROWTH Performed at [facility], [address], [place], [place] [address]    LABORGA NO GROWTH 06/10/2016     Lab Results  Component Value Date   ALBUMIN 4.3 06/13/2023   ALBUMIN 4.3 05/29/2023   ALBUMIN 4.1 12/13/2022    Lab Results  Component Value Date   MG 1.8 03/28/2021   Lab Results  Component Value Date   VD25OH 45 08/25/2023   VD25OH 47.92 05/29/2023   VD25OH 42.96 04/05/2022    No results found for: PREALBUMIN    Latest Ref Rng & Units 06/13/2023    1:03 PM 05/29/2023    1:26 PM 12/13/2022   12:51 PM  CBC EXTENDED  WBC 4.0 - 10.5 K/uL 19.8  22.9 Repeated and verified X2.  19.4   RBC 3.87 - 5.11 MIL/uL 3.54  3.59  3.40   Hemoglobin 12.0 - 15.0 g/dL 88.2  87.6   88.2   HCT 36.0 - 46.0 % 35.3  36.6  33.6   Platelets 150 - 400 K/uL 129  120.0  121   NEUT# 1.7 - 7.7 K/uL 2.1  2.2  1.6   Lymph# 0.7 - 4.0 K/uL 14.8  20.1  17.3      There is no height or weight on file to calculate BMI.  Orders:  No orders of the defined types were placed in this encounter.  No orders of the defined types were placed in this encounter.    Procedures: Large Joint Inj: bilateral knee on 10/05/2023 1:07 PM Indications: pain and diagnostic evaluation Details: 1.5 in anteromedial approach  Arthrogram: No  Medications (Right): 30 mg Hyaluronan 30 MG/2ML Medications (Left): 30 mg Hyaluronan 30 MG/2ML Outcome: tolerated well, no immediate complications Procedure, treatment alternatives, risks and benefits explained, specific risks discussed. Consent was given by the patient. Immediately prior to procedure a time out was called to verify the correct patient, procedure, equipment, support staff and site/side marked as required. Patient was prepped and draped in the usual sterile fashion.      Clinical Data: No additional findings.  ROS:  All other systems negative, except as noted  in the HPI. Review of Systems  Objective: Vital Signs: There were no vitals taken for this visit.  Specialty Comments:  No specialty comments available.  PMFS History: Patient Active Problem List   Diagnosis Date Noted   Osteoarthritis of knees, bilateral 07/19/2023   B12 deficiency 10/04/2022   Myalgia 04/05/2022   CLL (chronic lymphocytic leukemia) (HCC) 09/30/2021   Cholangitis 03/26/2021   Cholelithiasis 03/25/2021   Elevated LFTs 03/25/2021   Chronic hyponatremia 03/25/2021   Bilateral primary osteoarthritis of knee 06/11/2019   Pain in left shoulder 05/22/2019   Vitamin D  deficiency 12/25/2013   SPINAL STENOSIS, LUMBAR 03/09/2010   Osteoporosis 03/09/2010   MIGRAINE, OPHTHALMIC 10/29/2009   ADENOMATOUS COLONIC POLYP 06/10/2009   History of iron  deficiency  05/06/2009   Carotid artery disease (HCC) 07/28/2008   Allergic rhinitis 03/20/2008   DYSPHAGIA 03/12/2008   Ganglion of tendon sheath 08/17/2007   Hypothyroidism 11/15/2006   Hyperlipidemia 11/07/2006   Essential hypertension 10/18/2006   GERD 10/18/2006   DIVERTICULOSIS, COLON 10/18/2006   Past Medical History:  Diagnosis Date   Anemia    Anxiety    Bifascicular block    Diverticulitis    Diverticulosis    Gastroparesis    GERD (gastroesophageal reflux disease)    Glaucoma    Hemorrhoids    Hyperlipidemia    Hypertension    Hypertension    IBS (irritable bowel syndrome)    Rectal ulcer    SCOLIOSIS, LUMBAR SPINE 03/09/2010   Qualifier: Diagnosis of  By: Mavis MD, John E    Segmental colitis (HCC)    Tubular adenoma of colon 05/2009    Family History  Problem Relation Age of Onset   Diabetes Mother    Heart disease Mother    Hyperlipidemia Mother    Hypertension Mother    Anuerysm Father        abdominal- heavy smoker   Heart disease Father    Hypertension Father    Colon polyps Sister    Lung cancer Sister        smoker   Colon polyps Brother    Diabetes Brother    Hyperlipidemia Brother    Hypertension Brother    Heart disease Brother    Colon cancer Paternal Grandmother        with possible stomach cancer   Migraines Daughter    Kidney Stones Daughter    Colon polyps Daughter    Breast cancer Daughter    Cancer Daughter        salivary   Colon cancer Maternal Aunt        Rectal cancer   Crohn's disease Neg Hx    Pancreatic cancer Neg Hx     Past Surgical History:  Procedure Laterality Date   APPENDECTOMY     ARCUATE KERATECTOMY     BILIARY DILATION  03/27/2021   Procedure: BILIARY DILATION;  Surgeon: Teressa Toribio SQUIBB, MD;  Location: [facility];  Service: Gastroenterology;;   CATARACT EXTRACTION, BILATERAL  2015   CHOLECYSTECTOMY N/A 03/28/2021   Procedure: LAPAROSCOPIC CHOLECYSTECTOMY;  Surgeon: Debby Hila, MD;  Location: [facility];   Service: General;  Laterality: N/A;   double pallital tori bone removed      ERCP N/A 03/27/2021   Procedure: ENDOSCOPIC RETROGRADE CHOLANGIOPANCREATOGRAPHY (ERCP);  Surgeon: Teressa Toribio SQUIBB, MD;  Location: [facility];  Service: Gastroenterology;  Laterality: N/A;   HERNIA REPAIR     umbilical   KNEE SURGERY Bilateral    morton's neuroma on rt foot  and platar facial release     MOUTH SURGERY     bone shaved off roof of mouth   REMOVAL OF STONES  03/27/2021   Procedure: REMOVAL OF STONES;  Surgeon: Teressa Toribio SQUIBB, MD;  Location: [facility];  Service: Gastroenterology;;   SALIVARY GLAND SURGERY     removal   SPHINCTEROTOMY  03/27/2021   Procedure: SPHINCTEROTOMY;  Surgeon: Teressa Toribio SQUIBB, MD;  Location: [facility];  Service: Gastroenterology;;   TONSILLECTOMY     Social History   Occupational History   Occupation: Print production planner    Comment: retired  Tobacco Use   Smoking status: Never   Smokeless tobacco: Never  Vaping Use   Vaping status: Never Used  Substance and Sexual Activity   Alcohol use: Yes    Alcohol/week: 0.0 standard drinks of alcohol    Comment: rare   Drug use: No   Sexual activity: Not Currently

## 2023-10-10 DIAGNOSIS — R300 Dysuria: Secondary | ICD-10-CM | POA: Diagnosis not present

## 2023-10-10 DIAGNOSIS — Z4689 Encounter for fitting and adjustment of other specified devices: Secondary | ICD-10-CM | POA: Diagnosis not present

## 2023-10-10 DIAGNOSIS — N816 Rectocele: Secondary | ICD-10-CM | POA: Diagnosis not present

## 2023-10-10 DIAGNOSIS — R102 Pelvic and perineal pain: Secondary | ICD-10-CM | POA: Diagnosis not present

## 2023-10-10 DIAGNOSIS — N952 Postmenopausal atrophic vaginitis: Secondary | ICD-10-CM | POA: Diagnosis not present

## 2023-10-12 ENCOUNTER — Ambulatory Visit: Admitting: Physician Assistant

## 2023-10-12 ENCOUNTER — Encounter: Payer: Self-pay | Admitting: Physician Assistant

## 2023-10-12 DIAGNOSIS — M170 Bilateral primary osteoarthritis of knee: Secondary | ICD-10-CM | POA: Diagnosis not present

## 2023-10-12 MED ORDER — HYALURONAN 30 MG/2ML IX SOSY
30.0000 mg | PREFILLED_SYRINGE | INTRA_ARTICULAR | Status: AC | PRN
  Administered 2023-10-12: 30 mg via INTRA_ARTICULAR

## 2023-10-12 NOTE — Progress Notes (Signed)
 Office Visit Note   Patient: Rebecca [name]           Date of Birth: [birth year]           MRN: [id number] Visit Date: 10/12/2023              Requested by: Katrinka Garnette KIDD, MD [address] [place],  [place] [address] PCP: Katrinka Garnette KIDD, MD      HPI: Patient presents today for her second Orthovisc injection bilateral knees she tolerated the first 1 quite well feels maybe she is getting some relief  Assessment & Plan: Visit Diagnoses:  1. Primary osteoarthritis of both knees     Plan: Orthovisc bilateral knees injected will follow-up in 1 week for final injection  Follow-Up Instructions: Return in about 1 week (around 10/19/2023).   Ortho Exam  Patient is alert, oriented, no adenopathy, well-dressed, normal affect, normal respiratory effort. Bilateral knees no effusion no erythema compartments are soft and compressible she is neurovascular intact    Imaging: No results found. No images are attached to the encounter.  Labs: Lab Results  Component Value Date   HGBA1C 6.0 (H) 08/25/2023   HGBA1C 5.9 10/04/2022   HGBA1C 5.6 07/26/2006   ESRSEDRATE 22 02/10/2021   REPTSTATUS 03/17/2022 FINAL 03/16/2022   CULT (A) 03/16/2022    <10,000 COLONIES/mL INSIGNIFICANT GROWTH Performed at [facility], [address] [place], [place] [address]    LABORGA NO GROWTH 06/10/2016     Lab Results  Component Value Date   ALBUMIN 4.3 06/13/2023   ALBUMIN 4.3 05/29/2023   ALBUMIN 4.1 12/13/2022    Lab Results  Component Value Date   MG 1.8 03/28/2021   Lab Results  Component Value Date   VD25OH 45 08/25/2023   VD25OH 47.92 05/29/2023   VD25OH 42.96 04/05/2022    No results found for: PREALBUMIN    Latest Ref Rng & Units 06/13/2023    1:03 PM 05/29/2023    1:26 PM 12/13/2022   12:51 PM  CBC EXTENDED  WBC 4.0 - 10.5 K/uL 19.8  22.9 Repeated and verified X2.  19.4   RBC 3.87 - 5.11 MIL/uL 3.54  3.59  3.40   Hemoglobin 12.0 - 15.0 g/dL 88.2   87.6  88.2   HCT 36.0 - 46.0 % 35.3  36.6  33.6   Platelets 150 - 400 K/uL 129  120.0  121   NEUT# 1.7 - 7.7 K/uL 2.1  2.2  1.6   Lymph# 0.7 - 4.0 K/uL 14.8  20.1  17.3      There is no height or weight on file to calculate BMI.  Orders:  No orders of the defined types were placed in this encounter.  No orders of the defined types were placed in this encounter.    Procedures: Large Joint Inj: bilateral knee on 10/12/2023 1:40 PM Indications: pain and diagnostic evaluation Details: 22 G 1.5 in needle, anteromedial approach  Arthrogram: No  Medications (Right): 30 mg Hyaluronan 30 MG/2ML Medications (Left): 30 mg Hyaluronan 30 MG/2ML Outcome: tolerated well, no immediate complications Procedure, treatment alternatives, risks and benefits explained, specific risks discussed. Consent was given by the patient.     Clinical Data: No additional findings.  ROS:  All other systems negative, except as noted in the HPI. Review of Systems  Objective: Vital Signs: There were no vitals taken for this visit.  Specialty Comments:  No specialty comments available.  PMFS History: Patient Active Problem List  Diagnosis Date Noted  . Osteoarthritis of knees, bilateral 07/19/2023  . B12 deficiency 10/04/2022  . Myalgia 04/05/2022  . CLL (chronic lymphocytic leukemia) (HCC) 09/30/2021  . Cholangitis 03/26/2021  . Cholelithiasis 03/25/2021  . Elevated LFTs 03/25/2021  . Chronic hyponatremia 03/25/2021  . Bilateral primary osteoarthritis of knee 06/11/2019  . Pain in left shoulder 05/22/2019  . Vitamin D  deficiency 12/25/2013  . SPINAL STENOSIS, LUMBAR 03/09/2010  . Osteoporosis 03/09/2010  . MIGRAINE, OPHTHALMIC 10/29/2009  . ADENOMATOUS COLONIC POLYP 06/10/2009  . History of iron  deficiency 05/06/2009  . Carotid artery disease (HCC) 07/28/2008  . Allergic rhinitis 03/20/2008  . DYSPHAGIA 03/12/2008  . Ganglion of tendon sheath 08/17/2007  . Hypothyroidism 11/15/2006  .  Hyperlipidemia 11/07/2006  . Essential hypertension 10/18/2006  . GERD 10/18/2006  . DIVERTICULOSIS, COLON 10/18/2006   Past Medical History:  Diagnosis Date  . Anemia   . Anxiety   . Bifascicular block   . Diverticulitis   . Diverticulosis   . Gastroparesis   . GERD (gastroesophageal reflux disease)   . Glaucoma   . Hemorrhoids   . Hyperlipidemia   . Hypertension   . Hypertension   . IBS (irritable bowel syndrome)   . Rectal ulcer   . SCOLIOSIS, LUMBAR SPINE 03/09/2010   Qualifier: Diagnosis of  By: Mavis MD, Norleen BRAVO   . Segmental colitis (HCC)   . Tubular adenoma of colon 05/2009    Family History  Problem Relation Age of Onset  . Diabetes Mother   . Heart disease Mother   . Hyperlipidemia Mother   . Hypertension Mother   . Anuerysm Father        abdominal- heavy smoker  . Heart disease Father   . Hypertension Father   . Colon polyps Sister   . Lung cancer Sister        smoker  . Colon polyps Brother   . Diabetes Brother   . Hyperlipidemia Brother   . Hypertension Brother   . Heart disease Brother   . Colon cancer Paternal Grandmother        with possible stomach cancer  . Migraines Daughter   . Kidney Stones Daughter   . Colon polyps Daughter   . Breast cancer Daughter   . Cancer Daughter        salivary  . Colon cancer Maternal Aunt        Rectal cancer  . Crohn's disease Neg Hx   . Pancreatic cancer Neg Hx     Past Surgical History:  Procedure Laterality Date  . APPENDECTOMY    . ARCUATE KERATECTOMY    . BILIARY DILATION  03/27/2021   Procedure: BILIARY DILATION;  Surgeon: Teressa Toribio SQUIBB, MD;  Location: [facility];  Service: Gastroenterology;;  . CATARACT EXTRACTION, BILATERAL  2015  . CHOLECYSTECTOMY N/A 03/28/2021   Procedure: LAPAROSCOPIC CHOLECYSTECTOMY;  Surgeon: Debby Hila, MD;  Location: [facility];  Service: General;  Laterality: N/A;  . double pallital tori bone removed     . ERCP N/A 03/27/2021   Procedure: ENDOSCOPIC RETROGRADE  CHOLANGIOPANCREATOGRAPHY (ERCP);  Surgeon: Teressa Toribio SQUIBB, MD;  Location: [facility];  Service: Gastroenterology;  Laterality: N/A;  . HERNIA REPAIR     umbilical  . KNEE SURGERY Bilateral   . morton's neuroma on rt foot and platar facial release    . MOUTH SURGERY     bone shaved off roof of mouth  . REMOVAL OF STONES  03/27/2021   Procedure: REMOVAL OF STONES;  Surgeon:  Teressa Toribio SQUIBB, MD;  Location: [facility];  Service: Gastroenterology;;  . SALIVARY GLAND SURGERY     removal  . SPHINCTEROTOMY  03/27/2021   Procedure: ANNETT;  Surgeon: Teressa Toribio SQUIBB, MD;  Location: [facility];  Service: Gastroenterology;;  . TONSILLECTOMY     Social History   Occupational History  . Occupation: Print production planner    Comment: retired  Tobacco Use  . Smoking status: Never  . Smokeless tobacco: Never  Vaping Use  . Vaping status: Never Used  Substance and Sexual Activity  . Alcohol use: Yes    Alcohol/week: 0.0 standard drinks of alcohol    Comment: rare  . Drug use: No  . Sexual activity: Not Currently

## 2023-10-13 DIAGNOSIS — M419 Scoliosis, unspecified: Secondary | ICD-10-CM | POA: Diagnosis not present

## 2023-10-17 DIAGNOSIS — N898 Other specified noninflammatory disorders of vagina: Secondary | ICD-10-CM | POA: Diagnosis not present

## 2023-10-17 DIAGNOSIS — N8110 Cystocele, unspecified: Secondary | ICD-10-CM | POA: Diagnosis not present

## 2023-10-17 DIAGNOSIS — R102 Pelvic and perineal pain: Secondary | ICD-10-CM | POA: Diagnosis not present

## 2023-10-17 DIAGNOSIS — N952 Postmenopausal atrophic vaginitis: Secondary | ICD-10-CM | POA: Diagnosis not present

## 2023-10-19 ENCOUNTER — Encounter: Payer: Self-pay | Admitting: Physician Assistant

## 2023-10-19 ENCOUNTER — Ambulatory Visit (INDEPENDENT_AMBULATORY_CARE_PROVIDER_SITE_OTHER): Admitting: Physician Assistant

## 2023-10-19 DIAGNOSIS — M174 Other bilateral secondary osteoarthritis of knee: Secondary | ICD-10-CM

## 2023-10-19 MED ORDER — METHYLPREDNISOLONE ACETATE 40 MG/ML IJ SUSP
4.0000 mg | INTRAMUSCULAR | Status: AC | PRN
  Administered 2023-10-19: 4 mg via INTRA_ARTICULAR

## 2023-10-19 NOTE — Progress Notes (Signed)
 Office Visit Note   Patient: Rebecca [name]           Date of Birth: 06-05-1932           MRN: [id number] Visit Date: 10/19/2023              Requested by: Katrinka Garnette KIDD, MD [address] [place],  [place] [address] PCP: Katrinka Garnette KIDD, MD      HPI: Patient comes in today for bilateral Orthovisc injections this is her third set of injections.  She reports doing much better on the left knee still having a lot of pain on the right.  Denies any fever or chills  Assessment & Plan: Visit Diagnoses:  1. Other secondary osteoarthritis of both knees     Plan: Bilateral Orthovisc injections #3 injected without difficulty may follow-up as needed  Follow-Up Instructions: Return if symptoms worsen or fail to improve.   Ortho Exam  Patient is alert, oriented, no adenopathy, well-dressed, normal affect, normal respiratory effort. Bilateral knees no effusion no erythema compartments are soft and compressible she is neurovascular intact    Imaging: No results found. No images are attached to the encounter.  Labs: Lab Results  Component Value Date   HGBA1C 6.0 (H) 08/25/2023   HGBA1C 5.9 10/04/2022   HGBA1C 5.6 07/26/2006   ESRSEDRATE 22 02/10/2021   REPTSTATUS 03/17/2022 FINAL 03/16/2022   CULT (A) 03/16/2022    <10,000 COLONIES/mL INSIGNIFICANT GROWTH Performed at [facility], [address] [place], [place] [address]    LABORGA NO GROWTH 06/10/2016     Lab Results  Component Value Date   ALBUMIN 4.3 06/13/2023   ALBUMIN 4.3 05/29/2023   ALBUMIN 4.1 12/13/2022    Lab Results  Component Value Date   MG 1.8 03/28/2021   Lab Results  Component Value Date   VD25OH 45 08/25/2023   VD25OH 47.92 05/29/2023   VD25OH 42.96 04/05/2022    No results found for: PREALBUMIN    Latest Ref Rng & Units 06/13/2023    1:03 PM 05/29/2023    1:26 PM 12/13/2022   12:51 PM  CBC EXTENDED  WBC 4.0 - 10.5 K/uL 19.8  22.9 Repeated and verified X2.  19.4    RBC 3.87 - 5.11 MIL/uL 3.54  3.59  3.40   Hemoglobin 12.0 - 15.0 g/dL 88.2  87.6  88.2   HCT 36.0 - 46.0 % 35.3  36.6  33.6   Platelets 150 - 400 K/uL 129  120.0  121   NEUT# 1.7 - 7.7 K/uL 2.1  2.2  1.6   Lymph# 0.7 - 4.0 K/uL 14.8  20.1  17.3      There is no height or weight on file to calculate BMI.  Orders:  No orders of the defined types were placed in this encounter.  No orders of the defined types were placed in this encounter.    Procedures: Large Joint Inj: bilateral knee on 10/19/2023 10:27 AM Indications: pain and diagnostic evaluation Details: 25 G 1.5 in needle, anteromedial approach  Arthrogram: No  Medications (Right): 4 mg methylPREDNISolone  acetate 40 MG/ML Medications (Left): 4 mg methylPREDNISolone  acetate 40 MG/ML Outcome: tolerated well, no immediate complications Procedure, treatment alternatives, risks and benefits explained, specific risks discussed. Consent was given by the patient.     Clinical Data: No additional findings.  ROS:  All other systems negative, except as noted in the HPI. Review of Systems  Objective: Vital Signs: There were no vitals  taken for this visit.  Specialty Comments:  No specialty comments available.  PMFS History: Patient Active Problem List   Diagnosis Date Noted  . Osteoarthritis of knees, bilateral 07/19/2023  . B12 deficiency 10/04/2022  . Myalgia 04/05/2022  . CLL (chronic lymphocytic leukemia) (HCC) 09/30/2021  . Cholangitis 03/26/2021  . Cholelithiasis 03/25/2021  . Elevated LFTs 03/25/2021  . Chronic hyponatremia 03/25/2021  . Bilateral primary osteoarthritis of knee 06/11/2019  . Pain in left shoulder 05/22/2019  . Vitamin D  deficiency 12/25/2013  . SPINAL STENOSIS, LUMBAR 03/09/2010  . Osteoporosis 03/09/2010  . MIGRAINE, OPHTHALMIC 10/29/2009  . ADENOMATOUS COLONIC POLYP 06/10/2009  . History of iron  deficiency 05/06/2009  . Carotid artery disease (HCC) 07/28/2008  . Allergic rhinitis  03/20/2008  . DYSPHAGIA 03/12/2008  . Ganglion of tendon sheath 08/17/2007  . Hypothyroidism 11/15/2006  . Hyperlipidemia 11/07/2006  . Essential hypertension 10/18/2006  . GERD 10/18/2006  . DIVERTICULOSIS, COLON 10/18/2006   Past Medical History:  Diagnosis Date  . Anemia   . Anxiety   . Bifascicular block   . Diverticulitis   . Diverticulosis   . Gastroparesis   . GERD (gastroesophageal reflux disease)   . Glaucoma   . Hemorrhoids   . Hyperlipidemia   . Hypertension   . Hypertension   . IBS (irritable bowel syndrome)   . Rectal ulcer   . SCOLIOSIS, LUMBAR SPINE 03/09/2010   Qualifier: Diagnosis of  By: Mavis MD, Norleen BRAVO   . Segmental colitis (HCC)   . Tubular adenoma of colon 05/2009    Family History  Problem Relation Age of Onset  . Diabetes Mother   . Heart disease Mother   . Hyperlipidemia Mother   . Hypertension Mother   . Anuerysm Father        abdominal- heavy smoker  . Heart disease Father   . Hypertension Father   . Colon polyps Sister   . Lung cancer Sister        smoker  . Colon polyps Brother   . Diabetes Brother   . Hyperlipidemia Brother   . Hypertension Brother   . Heart disease Brother   . Colon cancer Paternal Grandmother        with possible stomach cancer  . Migraines Daughter   . Kidney Stones Daughter   . Colon polyps Daughter   . Breast cancer Daughter   . Cancer Daughter        salivary  . Colon cancer Maternal Aunt        Rectal cancer  . Crohn's disease Neg Hx   . Pancreatic cancer Neg Hx     Past Surgical History:  Procedure Laterality Date  . APPENDECTOMY    . ARCUATE KERATECTOMY    . BILIARY DILATION  03/27/2021   Procedure: BILIARY DILATION;  Surgeon: Teressa Toribio SQUIBB, MD;  Location: [facility];  Service: Gastroenterology;;  . CATARACT EXTRACTION, BILATERAL  2015  . CHOLECYSTECTOMY N/A 03/28/2021   Procedure: LAPAROSCOPIC CHOLECYSTECTOMY;  Surgeon: Debby Hila, MD;  Location: [facility];  Service: General;   Laterality: N/A;  . double pallital tori bone removed     . ERCP N/A 03/27/2021   Procedure: ENDOSCOPIC RETROGRADE CHOLANGIOPANCREATOGRAPHY (ERCP);  Surgeon: Teressa Toribio SQUIBB, MD;  Location: [facility];  Service: Gastroenterology;  Laterality: N/A;  . HERNIA REPAIR     umbilical  . KNEE SURGERY Bilateral   . morton's neuroma on rt foot and platar facial release    . MOUTH SURGERY  bone shaved off roof of mouth  . REMOVAL OF STONES  03/27/2021   Procedure: REMOVAL OF STONES;  Surgeon: Teressa Toribio SQUIBB, MD;  Location: [facility];  Service: Gastroenterology;;  . SALIVARY GLAND SURGERY     removal  . SPHINCTEROTOMY  03/27/2021   Procedure: ANNETT;  Surgeon: Teressa Toribio SQUIBB, MD;  Location: [facility];  Service: Gastroenterology;;  . TONSILLECTOMY     Social History   Occupational History  . Occupation: Print production planner    Comment: retired  Tobacco Use  . Smoking status: Never  . Smokeless tobacco: Never  Vaping Use  . Vaping status: Never Used  Substance and Sexual Activity  . Alcohol use: Yes    Alcohol/week: 0.0 standard drinks of alcohol    Comment: rare  . Drug use: No  . Sexual activity: Not Currently

## 2023-10-20 ENCOUNTER — Ambulatory Visit: Admitting: Physician Assistant

## 2023-10-26 ENCOUNTER — Ambulatory Visit: Admitting: Physical Therapy

## 2023-10-31 ENCOUNTER — Other Ambulatory Visit: Payer: Self-pay | Admitting: Family Medicine

## 2023-11-12 ENCOUNTER — Encounter: Payer: Self-pay | Admitting: Family Medicine

## 2023-11-13 NOTE — Telephone Encounter (Signed)
 Please see patient message.

## 2023-11-15 DIAGNOSIS — N952 Postmenopausal atrophic vaginitis: Secondary | ICD-10-CM | POA: Diagnosis not present

## 2023-11-15 DIAGNOSIS — R102 Pelvic and perineal pain: Secondary | ICD-10-CM | POA: Diagnosis not present

## 2023-11-15 DIAGNOSIS — N763 Subacute and chronic vulvitis: Secondary | ICD-10-CM | POA: Diagnosis not present

## 2023-11-20 ENCOUNTER — Other Ambulatory Visit: Payer: Self-pay | Admitting: Family Medicine

## 2023-11-27 ENCOUNTER — Telehealth: Payer: Self-pay | Admitting: Physician Assistant

## 2023-11-27 NOTE — Telephone Encounter (Signed)
 Patient called. Would like Rebecca [name] to call her.

## 2023-11-28 ENCOUNTER — Ambulatory Visit (INDEPENDENT_AMBULATORY_CARE_PROVIDER_SITE_OTHER): Payer: Medicare Other | Admitting: Family Medicine

## 2023-11-28 ENCOUNTER — Encounter: Payer: Self-pay | Admitting: Family Medicine

## 2023-11-28 ENCOUNTER — Ambulatory Visit: Payer: Medicare Other | Admitting: Family Medicine

## 2023-11-28 ENCOUNTER — Telehealth: Payer: Self-pay | Admitting: Family Medicine

## 2023-11-28 ENCOUNTER — Ambulatory Visit: Payer: Self-pay | Admitting: Family Medicine

## 2023-11-28 VITALS — BP 128/68 | HR 57 | Temp 98.0°F | Ht 62.0 in | Wt 115.4 lb

## 2023-11-28 DIAGNOSIS — M48061 Spinal stenosis, lumbar region without neurogenic claudication: Secondary | ICD-10-CM

## 2023-11-28 DIAGNOSIS — E785 Hyperlipidemia, unspecified: Secondary | ICD-10-CM

## 2023-11-28 DIAGNOSIS — E039 Hypothyroidism, unspecified: Secondary | ICD-10-CM | POA: Diagnosis not present

## 2023-11-28 LAB — CBC WITH DIFFERENTIAL/PLATELET
Basophils Absolute: 0.1 K/uL (ref 0.0–0.1)
Basophils Relative: 0.3 % (ref 0.0–3.0)
Eosinophils Absolute: 0.1 K/uL (ref 0.0–0.7)
Eosinophils Relative: 0.5 % (ref 0.0–5.0)
HCT: 35.4 % — ABNORMAL LOW (ref 36.0–46.0)
Hemoglobin: 11.7 g/dL — ABNORMAL LOW (ref 12.0–15.0)
Lymphocytes Relative: 91 % — ABNORMAL HIGH (ref 12.0–46.0)
Lymphs Abs: 23.9 K/uL — ABNORMAL HIGH (ref 0.7–4.0)
MCHC: 33.2 g/dL (ref 30.0–36.0)
MCV: 102.5 fl — ABNORMAL HIGH (ref 78.0–100.0)
Monocytes Absolute: 0.4 K/uL (ref 0.1–1.0)
Monocytes Relative: 1.6 % — ABNORMAL LOW (ref 3.0–12.0)
Neutro Abs: 1.7 K/uL (ref 1.4–7.7)
Neutrophils Relative %: 6.6 % — ABNORMAL LOW (ref 43.0–77.0)
Platelets: 125 K/uL — ABNORMAL LOW (ref 150.0–400.0)
RBC: 3.45 Mil/uL — ABNORMAL LOW (ref 3.87–5.11)
RDW: 13.9 % (ref 11.5–15.5)
WBC: 26.3 K/uL (ref 4.0–10.5)

## 2023-11-28 LAB — LIPID PANEL
Cholesterol: 188 mg/dL (ref 0–200)
HDL: 38.2 mg/dL — ABNORMAL LOW
LDL Cholesterol: 113 mg/dL — ABNORMAL HIGH (ref 0–99)
NonHDL: 150.18
Total CHOL/HDL Ratio: 5
Triglycerides: 187 mg/dL — ABNORMAL HIGH (ref 0.0–149.0)
VLDL: 37.4 mg/dL (ref 0.0–40.0)

## 2023-11-28 LAB — TSH: TSH: 0.36 u[IU]/mL (ref 0.35–5.50)

## 2023-11-28 LAB — COMPREHENSIVE METABOLIC PANEL WITH GFR
ALT: 14 U/L (ref 0–35)
AST: 21 U/L (ref 0–37)
Albumin: 4.3 g/dL (ref 3.5–5.2)
Alkaline Phosphatase: 57 U/L (ref 39–117)
BUN: 16 mg/dL (ref 6–23)
CO2: 25 meq/L (ref 19–32)
Calcium: 9.7 mg/dL (ref 8.4–10.5)
Chloride: 98 meq/L (ref 96–112)
Creatinine, Ser: 0.99 mg/dL (ref 0.40–1.20)
GFR: 50.82 mL/min — ABNORMAL LOW
Glucose, Bld: 108 mg/dL — ABNORMAL HIGH (ref 70–99)
Potassium: 4.4 meq/L (ref 3.5–5.1)
Sodium: 134 meq/L — ABNORMAL LOW (ref 135–145)
Total Bilirubin: 0.7 mg/dL (ref 0.2–1.2)
Total Protein: 6.2 g/dL (ref 6.0–8.3)

## 2023-11-28 IMAGING — CT CT ABD-PELV W/O CM
2 of 4 series · 14 of 46 positions shown, 16 images · non-contrast
Comparison: 02/23/2016
COMPARISON: 02/23/2016

Addendum:
CLINICAL DATA: Abdominal pain on the right, initial encounter

EXAM:
CT ABDOMEN AND PELVIS WITHOUT CONTRAST
TECHNIQUE: Multidetector CT imaging of the abdomen and pelvis was performed
following the standard protocol without IV contrast.

[Series 2: axial st · axial · 0.71mm/px · z∈[-376,+4]mm · 11 of 88 slices shown, 13 images]
[im 6/88  soft-tissue]
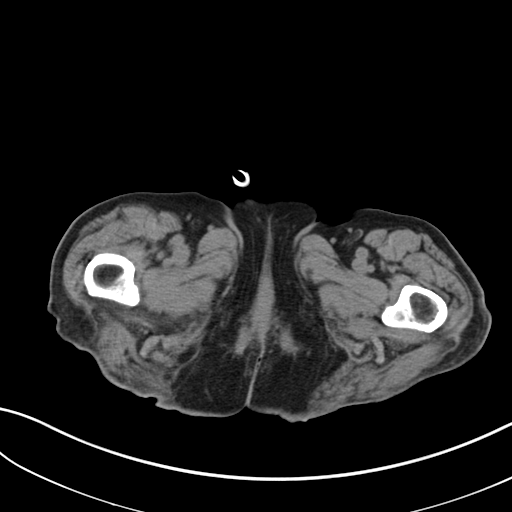
[im 6/88  bone]
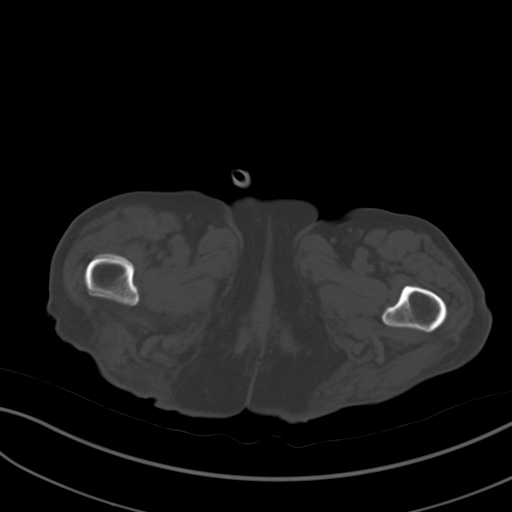
[im 17/88  soft-tissue]
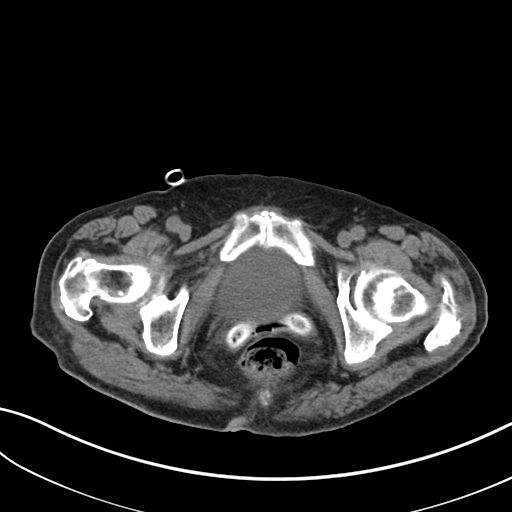
[im 22/88  soft-tissue]
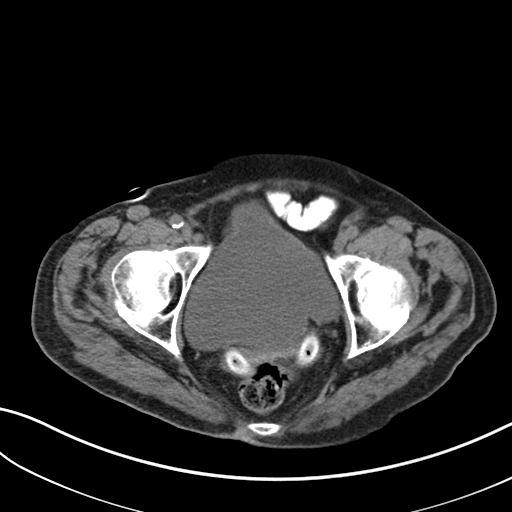
[im 28/88  soft-tissue]
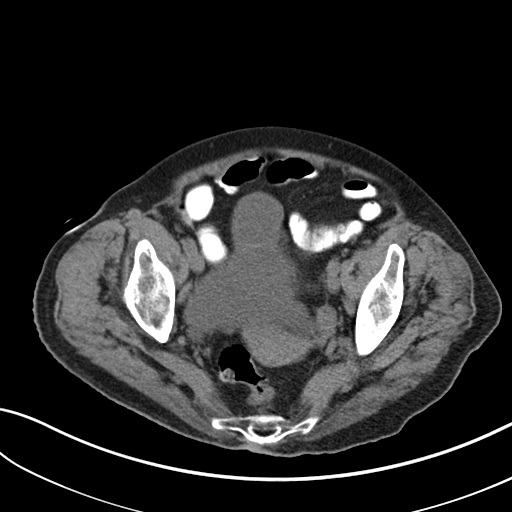
[im 39/88  soft-tissue]
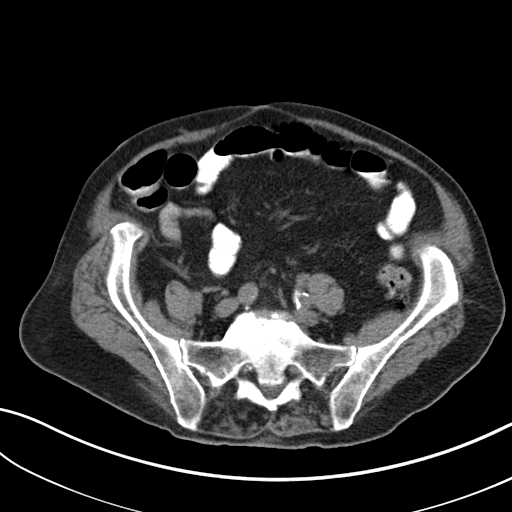
[im 44/88  soft-tissue]
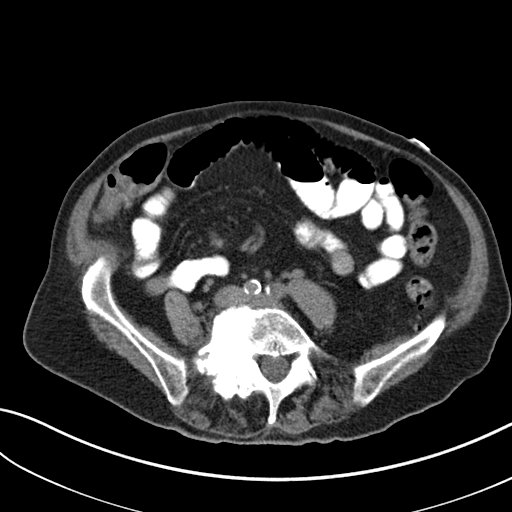
[im 49/88  soft-tissue]
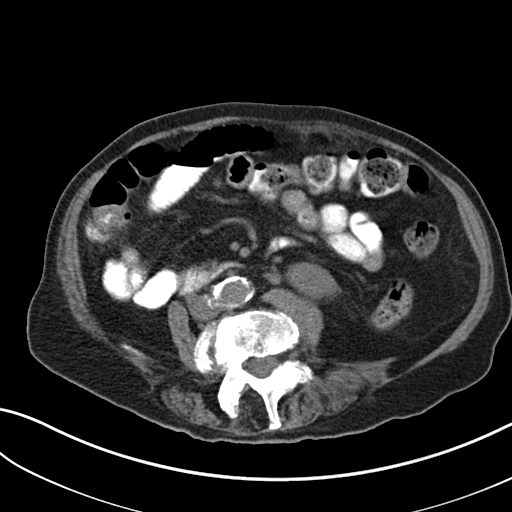
[im 60/88  soft-tissue]
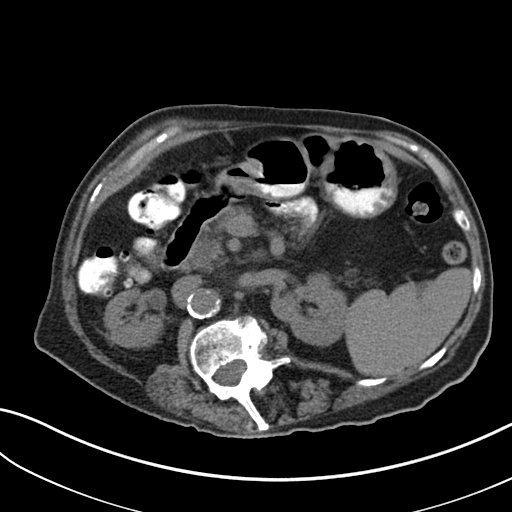
[im 66/88  soft-tissue]
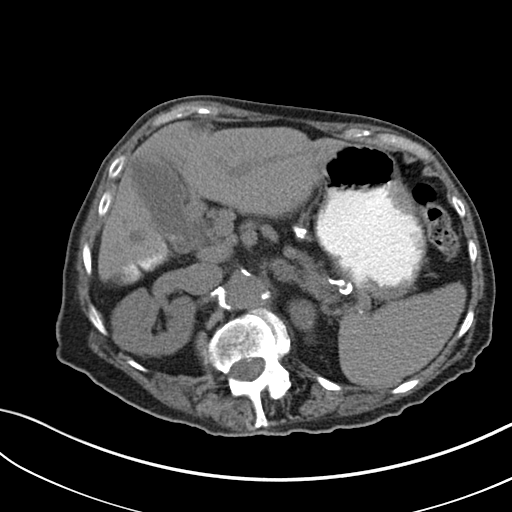
[im 66/88  bone]
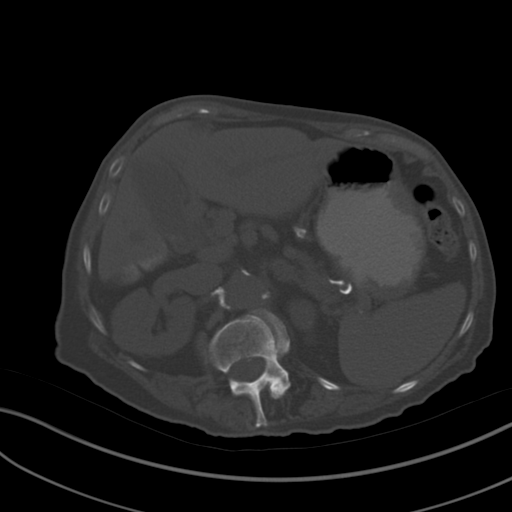
[im 71/88  soft-tissue]
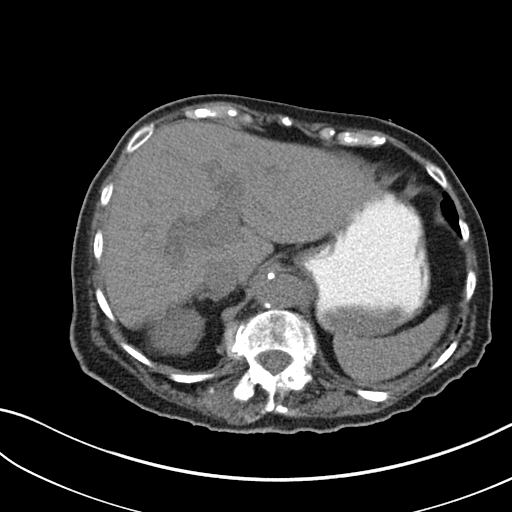
[im 82/88  soft-tissue]
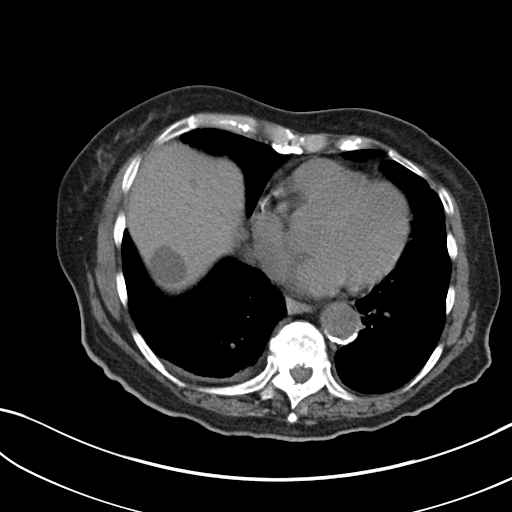

[Series 4: coronal st · coronal · 0.80mm/px · 3 of 127 slices shown]
[im 43/127  soft-tissue]
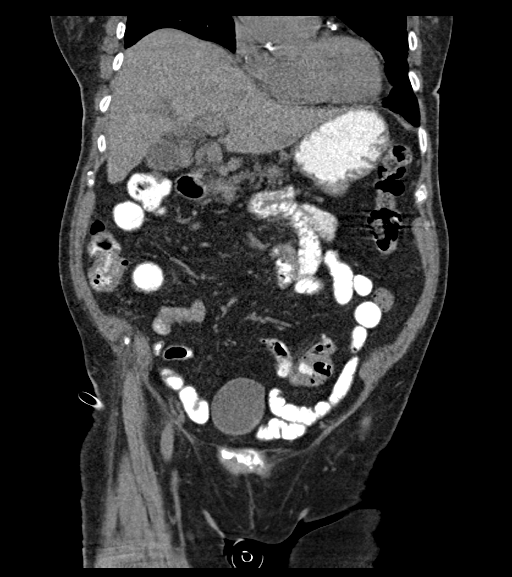
[im 57/127  soft-tissue]
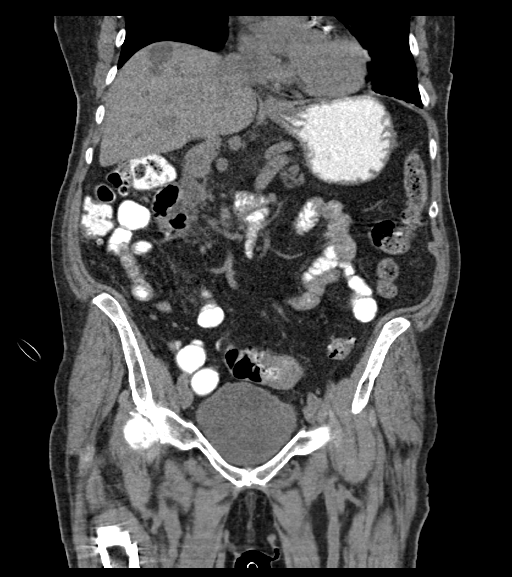
[im 71/127  soft-tissue]
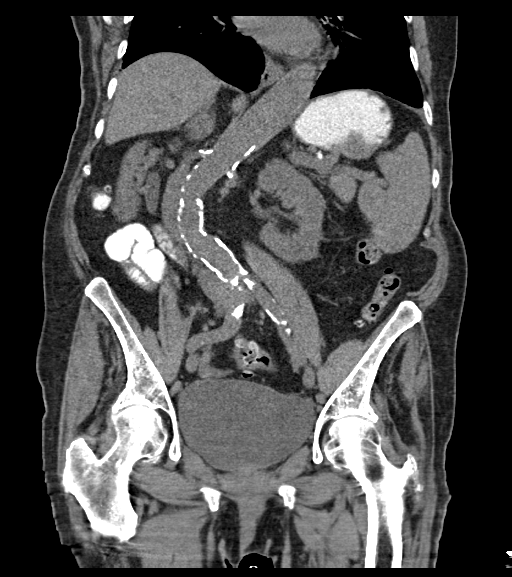

[14 of 46 positions shown; findings below may reference images not displayed]

FINDINGS: Lower chest: Lung bases are free of acute infiltrate. Bilateral
lower lobe nodules are noted measuring up to 9 mm on the left and 6
mm on the right. These both show slight increase in size when
compared with the prior exam. The larger measured approximately 7 mm
on the prior study.

Hepatobiliary: Scattered hypodensities are noted throughout the
liver consistent with simple cyst. These are stable from the prior
exam. Gallbladder is well distended with dependent density
consistent with small stones. No wall thickening or inflammatory
changes are noted.

Pancreas: Unremarkable. No pancreatic ductal dilatation or
surrounding inflammatory changes.

Spleen: Normal in size without focal abnormality.

Adrenals/Urinary Tract: Adrenal glands are within normal limits.
Kidneys demonstrate no renal calculi or obstructive changes. Bladder
is well distended. Findings suspicious for any anterior bladder
diverticulum are noted.

Stomach/Bowel: Scattered diverticular change is seen without
evidence of diverticulitis. No obstructive changes of the colon are
seen. The appendix has been surgically removed stomach is
unremarkable. Proximal small bowel is unremarkable. There are a few
loops which are mildly prominent but measure less than 3 cm in
diameter. No definitive obstructive changes are seen as contrast
passes distally into the proximal colon.

Vascular/Lymphatic: Aortic atherosclerosis. No enlarged abdominal or
pelvic lymph nodes.

Reproductive: Uterus and bilateral adnexa are unremarkable. Pessary
is noted in place.

Other: No abdominal wall hernia or abnormality. No abdominopelvic
ascites.

Musculoskeletal: Degenerative changes of lumbar spine are seen with
scoliosis concave to the left.
IMPRESSION: Diverticulosis without diverticulitis.

Mildly prominent small bowel although measuring less than 3 cm in
diameter and contrast material passes through this area without
difficulty. No inflammatory changes are seen.

Cholelithiasis without definitive complicating factors. Ultrasound
may be helpful for further evaluation. No other focal abnormality is
noted.

ADDENDUM:
Bilateral lower lobe nodules are identified as described which are
slightly increased when compared with 9450. Non-contrast chest CT at
3-6 months is recommended. If the nodules are stable at time of
repeat CT, then future CT at 18-24 months (from today's scan) is
considered optional for low-risk patients, but is recommended for
high-risk patients. This recommendation follows the consensus
statement: Guidelines for Management of Incidental Pulmonary Nodules
Detected on CT Images: From the [HOSPITAL] 9450; Radiology
9450; [DATE].

*** End of Addendum ***
FINDINGS: Lower chest: Lung bases are free of acute infiltrate. Bilateral
lower lobe nodules are noted measuring up to 9 mm on the left and 6
mm on the right. These both show slight increase in size when
compared with the prior exam. The larger measured approximately 7 mm
on the prior study.

Hepatobiliary: Scattered hypodensities are noted throughout the
liver consistent with simple cyst. These are stable from the prior
exam. Gallbladder is well distended with dependent density
consistent with small stones. No wall thickening or inflammatory
changes are noted.

Pancreas: Unremarkable. No pancreatic ductal dilatation or
surrounding inflammatory changes.

Spleen: Normal in size without focal abnormality.

Adrenals/Urinary Tract: Adrenal glands are within normal limits.
Kidneys demonstrate no renal calculi or obstructive changes. Bladder
is well distended. Findings suspicious for any anterior bladder
diverticulum are noted.

Stomach/Bowel: Scattered diverticular change is seen without
evidence of diverticulitis. No obstructive changes of the colon are
seen. The appendix has been surgically removed stomach is
unremarkable. Proximal small bowel is unremarkable. There are a few
loops which are mildly prominent but measure less than 3 cm in
diameter. No definitive obstructive changes are seen as contrast
passes distally into the proximal colon.

Vascular/Lymphatic: Aortic atherosclerosis. No enlarged abdominal or
pelvic lymph nodes.

Reproductive: Uterus and bilateral adnexa are unremarkable. Pessary
is noted in place.

Other: No abdominal wall hernia or abnormality. No abdominopelvic
ascites.

Musculoskeletal: Degenerative changes of lumbar spine are seen with
scoliosis concave to the left.
IMPRESSION: Diverticulosis without diverticulitis.

Mildly prominent small bowel although measuring less than 3 cm in
diameter and contrast material passes through this area without
difficulty. No inflammatory changes are seen.

Cholelithiasis without definitive complicating factors. Ultrasound
may be helpful for further evaluation. No other focal abnormality is
noted.

## 2023-11-28 MED ORDER — LOSARTAN POTASSIUM 100 MG PO TABS: 100.0000 mg | ORAL_TABLET | Freq: Every day | ORAL | 3 refills | Status: AC

## 2023-11-28 MED ORDER — LEVOTHYROXINE SODIUM 88 MCG PO TABS: 88.0000 ug | ORAL_TABLET | Freq: Every day | ORAL | 3 refills | Status: AC

## 2023-11-28 NOTE — Patient Instructions (Addendum)
 Would recommend using walker everywhere- I think this lowers your fall risk. Safer than the crutch  Please stop by lab before you go If you have mychart- we will send your results within 3 business days of us  receiving them.  If you do not have mychart- we will call you about results within 5 business days of us  receiving them.  *please also note that you will see labs on mychart as soon as they post. I will later go in and write notes on them- will say notes from Dr. Katrinka   Recommended follow up: Return in about 6 months (around 05/30/2024) for followup or sooner if needed.Schedule b4 you leave.

## 2023-11-28 NOTE — Progress Notes (Signed)
 Phone [phone] In person visit   Subjective:   Rebecca [name] is a 89 y.o. year old very pleasant female patient who presents for/with See problem oriented charting Chief Complaint  Patient presents with   Numbness    Both feet are still numb; was diagnosed with neuropathy by neurosurgeon 10/13/2023.    Past Medical History-  Patient Active Problem List   Diagnosis Date Noted   CLL (chronic lymphocytic leukemia) (HCC) 09/30/2021    Priority: High   Carotid artery disease (HCC) 07/28/2008    Priority: High   B12 deficiency 10/04/2022    Priority: Medium    Myalgia 04/05/2022    Priority: Medium    Chronic hyponatremia 03/25/2021    Priority: Medium    Vitamin D  deficiency 12/25/2013    Priority: Medium    SPINAL STENOSIS, LUMBAR 03/09/2010    Priority: Medium    Osteoporosis 03/09/2010    Priority: Medium    MIGRAINE, OPHTHALMIC 10/29/2009    Priority: Medium    Hypothyroidism 11/15/2006    Priority: Medium    Hyperlipidemia 11/07/2006    Priority: Medium    Essential hypertension 10/18/2006    Priority: Medium    GERD 10/18/2006    Priority: Medium    Cholangitis 03/26/2021    Priority: Low   Cholelithiasis 03/25/2021    Priority: Low   Elevated LFTs 03/25/2021    Priority: Low   Bilateral primary osteoarthritis of knee 06/11/2019    Priority: Low   Pain in left shoulder 05/22/2019    Priority: Low   ADENOMATOUS COLONIC POLYP 06/10/2009    Priority: Low   History of iron  deficiency 05/06/2009    Priority: Low   Allergic rhinitis 03/20/2008    Priority: Low   DYSPHAGIA 03/12/2008    Priority: Low   Ganglion of tendon sheath 08/17/2007    Priority: Low   DIVERTICULOSIS, COLON 10/18/2006    Priority: Low   Osteoarthritis of knees, bilateral 07/19/2023    Medications- reviewed and updated Current Outpatient Medications  Medication Sig Dispense Refill   acetaminophen  (TYLENOL ) 500 MG tablet Take 500-1,000 mg by mouth every 6 (six) hours as needed  for mild pain or headache.     ALPRAZolam  (XANAX ) 0.25 MG tablet TAKE 1 TABLET BY MOUTH AT BEDTIME AS NEEDED. DO NOT DRIVE FOR 8 HOURS AFTER TAKING AND STOP IF ANY FALLS UNTIL NEXT DOCTOR VISIT 20 tablet 0   atorvastatin  (LIPITOR) 10 MG tablet TAKE 1 TABLET(10 MG) BY MOUTH 3 TIMES A WEEK 39 tablet 3   Calcium  Carb-Cholecalciferol (CALCIUM -VITAMIN D ) 600-400 MG-UNIT TABS Take 1 tablet by mouth daily.     Cholecalciferol (VITAMIN D3) 2000 UNITS TABS Take 1 tablet by mouth daily.     diclofenac  sodium (VOLTAREN ) 1 % GEL Apply 2 g topically 4 (four) times daily. (Patient taking differently: Apply 2 g topically 4 (four) times daily as needed (for pain).) 1 Tube 1   esomeprazole  (NEXIUM ) 20 MG capsule Take 20 mg by mouth 2 (two) times daily before a meal.     estradiol (ESTRACE) 0.1 MG/GM vaginal cream Place 0.5 g vaginally See admin instructions. Place 0.5 g vaginally two to three times a week     loperamide  (IMODIUM ) 2 MG capsule Take 1 capsule (2 mg total) by mouth 4 (four) times daily as needed for diarrhea or loose stools. 12 capsule 0   metoCLOPramide  (REGLAN ) 5 MG tablet TAKE 1 TABLET BY MOUTH TWICE DAILY WITH MEALS 90 tablet 3   ondansetron  (  ZOFRAN -ODT) 4 MG disintegrating tablet Take 1 tablet (4 mg total) by mouth every 8 (eight) hours as needed for nausea or vomiting. 20 tablet 0   ROCKLATAN 0.02-0.005 % SOLN      timolol  (TIMOPTIC ) 0.5 % ophthalmic solution Place 1 drop into both eyes 2 (two) times daily.     vitamin B-12 (CYANOCOBALAMIN ) 500 MCG tablet Take 500 mcg by mouth every morning.     vitamin C (ASCORBIC ACID) 500 MG tablet Take 500 mg by mouth daily.     VYZULTA 0.024 % SOLN Place 1 drop into both eyes at bedtime.     levothyroxine  (SYNTHROID ) 88 MCG tablet Take 1 tablet (88 mcg total) by mouth daily before breakfast. 90 tablet 3   losartan  (COZAAR ) 100 MG tablet Take 1 tablet (100 mg total) by mouth daily. 90 tablet 3   No current facility-administered medications for this visit.      Objective:  BP 128/68 (BP Location: Left Arm, Patient Position: Sitting, Cuff Size: Normal)   Pulse (!) 57   Temp 98 F (36.7 C) (Temporal)   Ht 5' 2 (1.575 m)   Wt 115 lb 6.4 oz (52.3 kg)   SpO2 94%   BMI 21.11 kg/m  Gen: NAD, resting comfortably CV: RRR no murmurs rubs or gallops Lungs: CTAB no crackles, wheeze, rhonchi  Ext: no edema Skin: warm, dry Neuro: walks with crutch- advised walker instead    Assessment and Plan   #social update- encouraged consideration of more supportive environment- still living at home and think it would be better to look at facility even if has independent living option given time it can take to get in and likelihood of some progression of debility as she ages  #Lumbar stenosis with history of sciatica-sees Dr. Colon S: Advanced scoliosis and lateral recess stenosis and lumbar spine -Ongoing neuropathy related to this with numbness in both feet.  Weakness in legs and fall risk and we recommended looking for more support outside of independent care in the home  -Recurrence in 2023 with Sterapred pack and physical therapy -walks with walker A/P: patient with ongoing neuropathy - weakness more on right side, numbness.tingling. stenosis contributes but DrRONITA Colon thinks multifactorial- offered neurology referral but declines. She understands his recommendation to stop driving- offered occupational evaluation but she declines that as well. She is ok holding off on treatment- not sure medicine would help her symptoms of weakness/numbness- might be better for pain but could increase fall risk -advised using walker   % CLL- follows with Dr. Onesimo- has upcoming visit- we will check CBC    #hypertension S: medication: losartan  100mg  BP Readings from Last 3 Encounters:  11/28/23 128/68  08/25/23 110/60  07/15/23 (!) 171/85  A/P: stable- continue current medicines    #hyperlipidemia # Carotid stenosis 1 to 39% on the right and left ICA, ECA  greater than 50% in 2022 S: Medication:atorvastatin  10 mg 3x a week Lab Results  Component Value Date   CHOL 267 (H) 04/05/2022   HDL 47.70 04/05/2022   LDLCALC 186 (H) 04/05/2022   LDLDIRECT 125.0 10/04/2022   TRIG 165.0 (H) 04/05/2022   CHOLHDL 6 04/05/2022  A/P: lipdis above goal last year- consider going to 4 days a week if still above goal- want to at least get LDL     #hypothyroidism S: compliant On thyroid  medication-levothyroxine  88 mcg Lab Results  Component Value Date   TSH 1.15 08/25/2023  A/P:hopefully stable- update tsh today. Continue current meds for  now    # Osteoporosis #Vitamin D  deficiency S: Last DEXA:  with GYN and has planned repeat Medication (bisphosphonate or prolia): 5 years reclast  through GYN in past  Calcium : 1200mg  (through diet ok) recommended  Vitamin D : At least 1000 units a day recommended-takes 2000 units Last vitamin D  Lab Results  Component Value Date   VD25OH 45 08/25/2023  A/P: hopefully stable- continue to follow up with gynecology     # B12 deficiency- not low last visit- could potentially even reduce dose  Recommended follow up: Return in about 6 months (around 05/30/2024) for followup or sooner if needed.Schedule b4 you leave. Future Appointments  Date Time Provider Department Center  12/26/2023 12:00 PM [facility] None  12/26/2023 12:30 PM Onesimo Emaline Brink, MD [facility] None    Lab/Order associations:   ICD-10-CM   1. Hypothyroidism, unspecified type  E03.9 TSH    2. Dyslipidemia  E78.5 levothyroxine  (SYNTHROID ) 88 MCG tablet    CBC with Differential/Platelet    Comprehensive metabolic panel with GFR    Lipid panel    3. SPINAL STENOSIS, LUMBAR  M48.061       Meds ordered this encounter  Medications   losartan  (COZAAR ) 100 MG tablet    Sig: Take 1 tablet (100 mg total) by mouth daily.    Dispense:  90 tablet    Refill:  3   levothyroxine  (SYNTHROID ) 88 MCG tablet    Sig: Take 1 tablet (88 mcg  total) by mouth daily before breakfast.    Dispense:  90 tablet    Refill:  3    Return precautions advised.  Garnette Lukes, MD

## 2023-11-28 NOTE — Telephone Encounter (Signed)
 CRITICAL VALUE STICKER  CRITICAL VALUE: WBC 26.3  RECEIVER (on-site recipient of call): Waddell Salles  DATE & TIME NOTIFIED: 11/28/2023 3:18pm  MESSENGER (representative from lab): [facility]  MD NOTIFIED:  Garnette Lukes   TIME OF NOTIFICATION: 3:21pm  RESPONSE: Known CLL, will send result note later.

## 2023-11-28 NOTE — Telephone Encounter (Signed)
 Agree known CLL- will message patient when full results available

## 2023-12-19 ENCOUNTER — Other Ambulatory Visit

## 2023-12-19 ENCOUNTER — Ambulatory Visit: Admitting: Hematology

## 2023-12-25 ENCOUNTER — Other Ambulatory Visit: Payer: Self-pay

## 2023-12-25 DIAGNOSIS — C9110 Chronic lymphocytic leukemia of B-cell type not having achieved remission: Secondary | ICD-10-CM

## 2023-12-26 ENCOUNTER — Telehealth: Payer: Self-pay | Admitting: Hematology

## 2023-12-26 ENCOUNTER — Inpatient Hospital Stay: Admitting: Hematology

## 2023-12-26 ENCOUNTER — Inpatient Hospital Stay

## 2024-01-03 ENCOUNTER — Inpatient Hospital Stay

## 2024-01-03 ENCOUNTER — Other Ambulatory Visit: Payer: Self-pay | Admitting: Family Medicine

## 2024-01-04 DIAGNOSIS — H401113 Primary open-angle glaucoma, right eye, severe stage: Secondary | ICD-10-CM | POA: Diagnosis not present

## 2024-01-04 DIAGNOSIS — H401122 Primary open-angle glaucoma, left eye, moderate stage: Secondary | ICD-10-CM | POA: Diagnosis not present

## 2024-01-05 ENCOUNTER — Telehealth: Payer: Self-pay | Admitting: Hematology

## 2024-01-05 ENCOUNTER — Inpatient Hospital Stay: Attending: Hematology

## 2024-01-05 DIAGNOSIS — Z800 Family history of malignant neoplasm of digestive organs: Secondary | ICD-10-CM | POA: Insufficient documentation

## 2024-01-05 DIAGNOSIS — Z801 Family history of malignant neoplasm of trachea, bronchus and lung: Secondary | ICD-10-CM | POA: Insufficient documentation

## 2024-01-05 DIAGNOSIS — E039 Hypothyroidism, unspecified: Secondary | ICD-10-CM | POA: Insufficient documentation

## 2024-01-05 DIAGNOSIS — G629 Polyneuropathy, unspecified: Secondary | ICD-10-CM | POA: Insufficient documentation

## 2024-01-05 DIAGNOSIS — E871 Hypo-osmolality and hyponatremia: Secondary | ICD-10-CM | POA: Diagnosis not present

## 2024-01-05 DIAGNOSIS — E785 Hyperlipidemia, unspecified: Secondary | ICD-10-CM | POA: Diagnosis not present

## 2024-01-05 DIAGNOSIS — Z803 Family history of malignant neoplasm of breast: Secondary | ICD-10-CM | POA: Diagnosis not present

## 2024-01-05 DIAGNOSIS — Z791 Long term (current) use of non-steroidal anti-inflammatories (NSAID): Secondary | ICD-10-CM | POA: Insufficient documentation

## 2024-01-05 DIAGNOSIS — I10 Essential (primary) hypertension: Secondary | ICD-10-CM | POA: Diagnosis not present

## 2024-01-05 DIAGNOSIS — C9110 Chronic lymphocytic leukemia of B-cell type not having achieved remission: Secondary | ICD-10-CM | POA: Diagnosis not present

## 2024-01-05 DIAGNOSIS — Z79899 Other long term (current) drug therapy: Secondary | ICD-10-CM | POA: Insufficient documentation

## 2024-01-05 LAB — CBC WITH DIFFERENTIAL (CANCER CENTER ONLY)
Basophils Absolute: 0 K/uL (ref 0.0–0.1)
Basophils Relative: 0 %
Eosinophils Absolute: 0 K/uL (ref 0.0–0.5)
Eosinophils Relative: 0 %
HCT: 36.4 % (ref 36.0–46.0)
Hemoglobin: 12.3 g/dL (ref 12.0–15.0)
Lymphocytes Relative: 88 %
Lymphs Abs: 22.2 K/uL — ABNORMAL HIGH (ref 0.7–4.0)
MCH: 34.3 pg — ABNORMAL HIGH (ref 26.0–34.0)
MCHC: 33.8 g/dL (ref 30.0–36.0)
MCV: 101.4 fL — ABNORMAL HIGH (ref 80.0–100.0)
Monocytes Absolute: 0.8 K/uL (ref 0.1–1.0)
Monocytes Relative: 3 %
Neutro Abs: 2.3 K/uL (ref 1.7–7.7)
Neutrophils Relative %: 9 %
Platelet Count: 106 K/uL — ABNORMAL LOW (ref 150–400)
RBC: 3.59 MIL/uL — ABNORMAL LOW (ref 3.87–5.11)
RDW: 13 % (ref 11.5–15.5)
Smear Review: NORMAL
WBC Count: 25.2 K/uL — ABNORMAL HIGH (ref 4.0–10.5)
nRBC: 0 % (ref 0.0–0.2)

## 2024-01-05 LAB — CMP (CANCER CENTER ONLY)
ALT: 16 U/L (ref 0–44)
AST: 23 U/L (ref 15–41)
Albumin: 4.4 g/dL (ref 3.5–5.0)
Alkaline Phosphatase: 69 U/L (ref 38–126)
Anion gap: 5 (ref 5–15)
BUN: 11 mg/dL (ref 8–23)
CO2: 28 mmol/L (ref 22–32)
Calcium: 10.7 mg/dL — ABNORMAL HIGH (ref 8.9–10.3)
Chloride: 99 mmol/L (ref 98–111)
Creatinine: 0.92 mg/dL (ref 0.44–1.00)
GFR, Estimated: 60 mL/min — ABNORMAL LOW (ref >60)
Glucose, Bld: 95 mg/dL (ref 70–99)
Potassium: 4.2 mmol/L (ref 3.5–5.1)
Sodium: 132 mmol/L — ABNORMAL LOW (ref 135–145)
Total Bilirubin: 0.5 mg/dL (ref 0.0–1.2)
Total Protein: 6.6 g/dL (ref 6.5–8.1)

## 2024-01-05 LAB — LACTATE DEHYDROGENASE: LDH: 142 U/L (ref 98–192)

## 2024-01-05 NOTE — Telephone Encounter (Signed)
 Due to the Government shut down and [employer], I have rescheduled her for an in person follow up appointment on 10/6 with Dr. Onesimo. Rebecca [name] is aware of her appointment and the reasoning behind the changes made.

## 2024-01-08 ENCOUNTER — Inpatient Hospital Stay: Admitting: Hematology

## 2024-01-08 VITALS — BP 155/58 | HR 46 | Temp 97.3°F | Resp 18 | Wt 119.5 lb

## 2024-01-08 DIAGNOSIS — E039 Hypothyroidism, unspecified: Secondary | ICD-10-CM | POA: Diagnosis not present

## 2024-01-08 DIAGNOSIS — C9110 Chronic lymphocytic leukemia of B-cell type not having achieved remission: Secondary | ICD-10-CM

## 2024-01-08 DIAGNOSIS — E871 Hypo-osmolality and hyponatremia: Secondary | ICD-10-CM | POA: Diagnosis not present

## 2024-01-08 DIAGNOSIS — G629 Polyneuropathy, unspecified: Secondary | ICD-10-CM | POA: Diagnosis not present

## 2024-01-08 DIAGNOSIS — E785 Hyperlipidemia, unspecified: Secondary | ICD-10-CM | POA: Diagnosis not present

## 2024-01-08 DIAGNOSIS — I10 Essential (primary) hypertension: Secondary | ICD-10-CM | POA: Diagnosis not present

## 2024-01-08 NOTE — Progress Notes (Signed)
 HEMATOLOGY ONCOLOGY PROGRESS NOTE  Date of service: 01/08/2024  Patient Care Team: Katrinka Garnette KIDD, MD as PCP - General (Family Medicine) Aneita Gwendlyn DASEN, MD (Inactive) as Consulting Physician (Gastroenterology) Kandyce Sor, MD as Consulting Physician (Obstetrics and Gynecology) Leslee Reusing, MD as Consulting Physician (Ophthalmology) Liane Sharyne MATSU, [facility] (Inactive) as Pharmacist (Pharmacist)  CHIEF COMPLAINT/PURPOSE OF CONSULTATION: Follow-up for continued evaluation and management of CLL   HISTORY OF PRESENTING ILLNESS: Rebecca [name] is a wonderful 89 y.o. female who has been referred to us  by Dr Bernarda Ned MD for evaluation and management of lymphocytosis.   Patient has a history of hypertension, GERD, hypothyroidism was noted to have biliary colic in November 2022 and referred for surgical evaluation due to persistent and worsening symptoms.  In the emergency room the patient was noted to have elevated LFTs and significant leukocytosis of 27.4k.  CT scan showed cholelithiasis, ultrasound abdomen showed cholelithiasis without acute cholecystitis.  MRCP showed distended CBD with choledocholithiasis.  Patient subsequently had ERCP on 12/24 with biliary sphincterotomy and subsequently had her laparoscopic cholecystectomy on 03/28/2021.   Surgical pathology from her cholecystectomy showed chronic cholecystitis without cholelithiasis. 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 Labs ordered 04/09/2021 showed persistent leukocytosis of 19.2k with lymphocyte count of 15.2k hemoglobin of 11.1 and platelets of 219k.   Patient was referred to us  for further evaluation of CD5 positive lymphoproliferative disorder.   Patient notes no fevers no chills no night sweats. No new lumps or bumps. CT abdomen 04/15/2020 showed enlarged abdominal pelvic lymph nodes.  SUMMARY OF ONCOLOGIC HISTORY: Oncology History   No history exists.     INTERVAL HISTORY: Rebecca [name] is a 89 y.o. female being seen here today for continued evaluation and management of her chronic lymphocytic leukemia. She is ambulating with a crutch today.   she was last seen by me on 06/13/2023; at the time she did not have any concerns. She noted having fallen circa Thanksgiving and at the time was using crutches and a cane for ambulation support.  Today, she says that she has been doing well overall - Denies any infection issues, fevers/chills, night sweats, unexpected/sudden weight loss, difficulty breathing, new lump/bumps, abdominal pain, changes in bowel/urinary habits, skin rashes/lesions, or leg swelling. Does endorse experiencing some neuropathy in her feet.    Regarding her crutch, she says that she prefers to use this over the cane she has. She explains that her fall occurred when she had gotten up to turn off her TV after waking up and lost her balance.  Denies difficulty completing her ADLs, and she does also receive assistance from her children for things she can no longer do, such as driving.  She reports seeing Dermatology annualy, and saw her PCP, Katrinka Garnette KIDD, MD on 11/28/2023. Continues to take Losartan  100 mg daily for HTN, however today her blood pressure is elevated at 155/62 and 155/58.  REVIEW OF SYSTEMS:    10 Point review of systems of done and is negative except as noted above.  MEDICAL HISTORY Past Medical History:  Diagnosis Date   Anemia    Anxiety    Bifascicular block    Diverticulitis    Diverticulosis    Gastroparesis    GERD (gastroesophageal reflux disease)    Glaucoma    Hemorrhoids    Hyperlipidemia    Hypertension    Hypertension    IBS (irritable bowel syndrome)    Rectal ulcer    SCOLIOSIS, LUMBAR  SPINE 03/09/2010   Qualifier: Diagnosis of  By: Mavis MD, John E    Segmental colitis [facility])    Tubular adenoma of colon 05/2009    SURGICAL HISTORY Past Surgical History:  Procedure Laterality  Date   APPENDECTOMY     ARCUATE KERATECTOMY     BILIARY DILATION  03/27/2021   Procedure: BILIARY DILATION;  Surgeon: Teressa Toribio SQUIBB, MD;  Location: [facility];  Service: Gastroenterology;;   CATARACT EXTRACTION, BILATERAL  2015   CHOLECYSTECTOMY N/A 03/28/2021   Procedure: LAPAROSCOPIC CHOLECYSTECTOMY;  Surgeon: Debby Hila, MD;  Location: [facility];  Service: General;  Laterality: N/A;   double pallital tori bone removed      ERCP N/A 03/27/2021   Procedure: ENDOSCOPIC RETROGRADE CHOLANGIOPANCREATOGRAPHY (ERCP);  Surgeon: Teressa Toribio SQUIBB, MD;  Location: [facility];  Service: Gastroenterology;  Laterality: N/A;   HERNIA REPAIR     umbilical   KNEE SURGERY Bilateral    morton's neuroma on rt foot and platar facial release     MOUTH SURGERY     bone shaved off roof of mouth   REMOVAL OF STONES  03/27/2021   Procedure: REMOVAL OF STONES;  Surgeon: Teressa Toribio SQUIBB, MD;  Location: [facility];  Service: Gastroenterology;;   SALIVARY GLAND SURGERY     removal   SPHINCTEROTOMY  03/27/2021   Procedure: SPHINCTEROTOMY;  Surgeon: Teressa Toribio SQUIBB, MD;  Location: [facility];  Service: Gastroenterology;;   TONSILLECTOMY      SOCIAL HISTORY Social History   Tobacco Use   Smoking status: Never   Smokeless tobacco: Never  Vaping Use   Vaping status: Never Used  Substance Use Topics   Alcohol use: Yes    Alcohol/week: 0.0 standard drinks of alcohol    Comment: rare   Drug use: No    Social History   Social History Narrative   Widowed. 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      Hobbies: Enjoys reading, especially mysteries, Walks for exercise   Attends church                   SOCIAL DRIVERS OF HEALTH SDOH Screenings   Food Insecurity: No Food Insecurity (05/28/2023)  Housing: Low Risk  (05/28/2023)  Transportation Needs: No Transportation Needs  (05/28/2023)  Utilities: Not At Risk (01/05/2023)  Alcohol Screen: Low Risk  (01/05/2023)  Depression (PHQ2-9): Low Risk  (01/05/2023)  Financial Resource Strain: Low Risk  (05/28/2023)  Physical Activity: Insufficiently Active (05/28/2023)  Social Connections: Moderately Isolated (05/28/2023)  Stress: No Stress Concern Present (05/28/2023)  Tobacco Use: Low Risk  (11/28/2023)  Health Literacy: Adequate Health Literacy (01/05/2023)     FAMILY HISTORY Family History  Problem Relation Age of Onset   Diabetes Mother    Heart disease Mother    Hyperlipidemia Mother    Hypertension Mother    Anuerysm Father        abdominal- heavy smoker   Heart disease Father    Hypertension Father    Colon polyps Sister    Lung cancer Sister        smoker   Colon polyps Brother    Diabetes Brother    Hyperlipidemia Brother    Hypertension Brother    Heart disease Brother    Colon cancer Paternal Grandmother        with possible stomach cancer   Migraines Daughter  Kidney Stones Daughter    Colon polyps Daughter    Breast cancer Daughter    Cancer Daughter        salivary   Colon cancer Maternal Aunt        Rectal cancer   Crohn's disease Neg Hx    Pancreatic cancer Neg Hx      ALLERGIES: is allergic to levaquin [levofloxacin], sulfamethoxazole-trimethoprim, lactose intolerance (gi), tramadol hcl, augmentin  [amoxicillin -pot clavulanate], ciprofloxacin, rosuvastatin , zosyn  [piperacillin  sod-tazobactam so], adhesive [tape], codeine, and pneumococcal vaccine polyvalent.  MEDICATIONS  Current Outpatient Medications  Medication Sig Dispense Refill   acetaminophen  (TYLENOL ) 500 MG tablet Take 500-1,000 mg by mouth every 6 (six) hours as needed for mild pain or headache.     ALPRAZolam  (XANAX ) 0.25 MG tablet TAKE 1 TABLET BY MOUTH AT BEDTIME AS NEEDED. DO NOT DRIVE FOR 8 HOURS AFTER TAKING AND STOP IF ANY FALLS UNTIL NEXT DOCTOR VISIT 20 tablet 0   atorvastatin  (LIPITOR) 10 MG tablet TAKE 1  TABLET(10 MG) BY MOUTH 3 TIMES A WEEK 39 tablet 3   Calcium  Carb-Cholecalciferol (CALCIUM -VITAMIN D ) 600-400 MG-UNIT TABS Take 1 tablet by mouth daily.     Cholecalciferol (VITAMIN D3) 2000 UNITS TABS Take 1 tablet by mouth daily.     diclofenac  sodium (VOLTAREN ) 1 % GEL Apply 2 g topically 4 (four) times daily. (Patient taking differently: Apply 2 g topically 4 (four) times daily as needed (for pain).) 1 Tube 1   esomeprazole  (NEXIUM ) 20 MG capsule Take 20 mg by mouth 2 (two) times daily before a meal.     estradiol (ESTRACE) 0.1 MG/GM vaginal cream Place 0.5 g vaginally See admin instructions. Place 0.5 g vaginally two to three times a week     levothyroxine  (SYNTHROID ) 88 MCG tablet Take 1 tablet (88 mcg total) by mouth daily before breakfast. 90 tablet 3   loperamide  (IMODIUM ) 2 MG capsule Take 1 capsule (2 mg total) by mouth 4 (four) times daily as needed for diarrhea or loose stools. 12 capsule 0   losartan  (COZAAR ) 100 MG tablet Take 1 tablet (100 mg total) by mouth daily. 90 tablet 3   metoCLOPramide  (REGLAN ) 5 MG tablet TAKE 1 TABLET BY MOUTH TWICE DAILY WITH MEALS 90 tablet 3   ondansetron  (ZOFRAN -ODT) 4 MG disintegrating tablet Take 1 tablet (4 mg total) by mouth every 8 (eight) hours as needed for nausea or vomiting. 20 tablet 0   ROCKLATAN 0.02-0.005 % SOLN      timolol  (TIMOPTIC ) 0.5 % ophthalmic solution Place 1 drop into both eyes 2 (two) times daily.     vitamin B-12 (CYANOCOBALAMIN ) 500 MCG tablet Take 500 mcg by mouth every morning.     vitamin C (ASCORBIC ACID) 500 MG tablet Take 500 mg by mouth daily.     VYZULTA 0.024 % SOLN Place 1 drop into both eyes at bedtime.     No current facility-administered medications for this visit.    VITALS: Vitals:   01/08/24 1218 01/08/24 1227  BP: (!) 155/62 (!) 155/58  Pulse: (!) 46   Resp: 18   Temp: (!) 97.3 F (36.3 C)   SpO2: 99%    Filed Weights   01/08/24 1218  Weight: 119 lb 8 oz (54.2 kg)   Body mass index is 21.86  kg/m.  PHYSICAL EXAMINATION: ECOG PERFORMANCE STATUS: 1 - Symptomatic but completely ambulatory .BP (!) 155/58 Comment: re-check  Pulse (!) 46   Temp (!) 97.3 F (36.3 C)   Resp 18  Wt 119 lb 8 oz (54.2 kg)   SpO2 99%   BMI 21.86 kg/m   GENERAL: alert, in no acute distress and comfortable SKIN: no acute rashes, no significant lesions EYES: conjunctiva are pink and non-injected, sclera anicteric OROPHARYNX: MMM, no exudates, no oropharyngeal erythema or ulceration NECK: supple, no JVD LYMPH:  (+) small cervical lymph nodes, no palpable lymphadenopathy in the axillary or inguinal regions LUNGS: clear to auscultation b/l with normal respiratory effort HEART: regular rate & rhythm ABDOMEN:  normoactive bowel sounds , non tender, not distended. Extremity: no pedal edema PSYCH: alert & oriented x 3 with fluent speech NEURO: no focal motor/sensory deficits  LABORATORY DATA:   I have reviewed the data as listed     Latest Ref Rng & Units 01/05/2024    2:34 PM 11/28/2023   11:55 AM 06/13/2023    1:03 PM  CBC EXTENDED  WBC 4.0 - 10.5 K/uL 25.2  26.3  19.8   RBC 3.87 - 5.11 MIL/uL 3.59  3.45  3.54   Hemoglobin 12.0 - 15.0 g/dL 87.6  88.2  88.2   HCT 36.0 - 46.0 % 36.4  35.4  35.3   Platelets 150 - 400 K/uL 106  125.0  129   NEUT# 1.7 - 7.7 K/uL 2.3  1.7  2.1   Lymph# 0.7 - 4.0 K/uL 22.2  23.9  14.8    LDH:  Lab Results  Component Value Date   LDH 142 01/05/2024       Latest Ref Rng & Units 01/05/2024    2:34 PM 11/28/2023   11:55 AM 06/13/2023    1:03 PM  CMP  Glucose 70 - 99 mg/dL 95  891  88   BUN 8 - 23 mg/dL 11  16  15    Creatinine 0.44 - 1.00 mg/dL 9.07  9.00  9.03   Sodium 135 - 145 mmol/L 132  134  131   Potassium 3.5 - 5.1 mmol/L 4.2  4.4  4.3   Chloride 98 - 111 mmol/L 99  98  98   CO2 22 - 32 mmol/L 28  25  27    Calcium  8.9 - 10.3 mg/dL 89.2  9.7  9.8   Total Protein 6.5 - 8.1 g/dL 6.6  6.2  6.1   Total Bilirubin 0.0 - 1.2 mg/dL 0.5  0.7  0.5   Alkaline  Phos 38 - 126 U/L 69  57  76   AST 15 - 41 U/L 23  21  19    ALT 0 - 44 U/L 16  14  12     2024 Dermatopathology   Component     Latest Ref Rng & Units 05/05/2021  Hep B Core Total Ab     NON REACTIVE NON REACTIVE  Hepatitis B Surface Ag     NON REACTIVE NON REACTIVE  HCV Ab     NON REACTIVE NON REACTIVE  LDH     98 - 192 U/L 123    2023 Flow Pathology Report   Clinical history: CLL   DIAGNOSIS:   -  Monoclonal B-cell population with co-expression of CD5 comprises 80%  of all lymphocytes  -  See comment   COMMENT:   The phenotypic features are consistent with involvement by non-Hodgkin  B-cell lymphoma. Given the presence of CD5 expression, the differential  diagnosis includes chronic lymphocytic leukemia/small lymphocytic  lymphoma and mantle cell lymphoma.    2022 SURGICAL PATHOLOGY   Surgical Pathology Report  Addendum   Reason for  Addendum #1:  Immunohistochemistry results   Clinical History: Acute Cholecystitis ([facility])    FINAL MICROSCOPIC DIAGNOSIS:   A. GALLBLADDER, CHOLECYSTECTOMY:  - Chronic cholecystitis, without cholelithiasis.   - Atypical CD5+ B cell infiltrate, most suggestive of involvement by  CLL/SLL.   COMMENT:   A. Given the presence of marked lymphocytic inflammation,  immunohistochemical studies were performed. Studies show a significant  predominance of CD20+ B cells, with aberrant co-expression of CD5 and  CD23. There are relatively fewer background CD3+ T cells. The features  are most suggestive of involvement by CLL/SLL. An immunohistochemical  study for Cyclin-D1 is pending, and will be reported as an addendum.  Recommend correlation with peripheral blood findings and evaluation for  other potential sites of adenopathy.         RADIOGRAPHIC STUDIES: I have personally reviewed the radiological images as listed and agreed with the findings in the report. No results found.  ASSESSMENT & PLAN:  89 y.o. female, s/p laparoscopic  cholecystectomy 2022, noted to have   1) recently diagnosed CLL/SLL with 13 q. and 11 q. Deletions.  2) CD5 positive lymphoid infiltrate in gallbladder pathology suggestive of involvement by CLL/SLL -CT abdomen was done recently on 03/25/2021-did not show significant lymphadenopathy or hepatosplenomegaly.  Noted to have some lung nodules in the lower lobes.   3) .  Basal cell carcinoma on the right side of the nose Seeing Dr. Rolan Molt at [facility] dermatology for management  -Continue active surveillance for recurrent skin cancers since the CLL especially with 13 q. deletion would be a risk factor.   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   5) hyponatremia-this appears chronic for about a year with sodium levels fluctuating from 125-133.  Likely multifactorial including poor solute intake and potentially from her hypothyroidism.  6) chronic bradycardia -likely multifactorial.  Also has hypothyroidism.   PLAN:  - Discussed lab results on 01/08/2024 in detail with patient: CBC showed WBC of 25K, Hgb normal at 12.3, HCT normal at 36.4%, PLTs of 106K, decreased from 125, and Lymphocytes of 22K.  CMP with normal Creatinine of 0.92, though Calcium  slightly elevated at 10.7.  Elevated Calcium  possible related to dehydration, but more likely excess consumption as she does take a 600 mg Calcium  supplement daily as well as dietary Calcium  Recommend holding Calcium  supplement for now Hydrate adequately by drinking 64 oz daily.   - No clinical or lab results suggestive of significant progression of chronic lymphocytic leukemia. - No treatment needed at this time for CLL. - Continue to follow-up with dermatologist for continued evaluation and management of cutaneous squamous cell and basal cell carcinomas.  Again discussed how CLL does increase her risk for these as does the specific finding of the 13 q. Deletion and age.  - Vaccine Counseling: Influenza recommended;  she reportedly will obtain this soon. UTD on Covid-19 vaccine, RSV, and Shingrix.  FOLLOW-UP in 3-4 months with Dr. Onesimo Regular follow-ups with Dermatologist and PCP  .The total time spent in the appointment was 20 minutes* .  All of the patient's questions were answered with apparent satisfaction. The patient knows to call the clinic with any problems, questions or concerns.   Emaline Onesimo MD MS AAHIVMS [facility] Hematology/Oncology Physician [facility]  .*Total Encounter Time as defined by the Centers for Medicare and Medicaid Services includes, in addition to the face-to-face time of a patient visit (documented in the note above) non-face-to-face time: obtaining and reviewing outside  history, ordering and reviewing medications, tests or procedures, care coordination (communications with other health care professionals or caregivers) and documentation in the medical record.   I,Emily Lagle,acting as a Neurosurgeon for Emaline Saran, MD.,have documented all relevant documentation on the behalf of Emaline Saran, MD,as directed by  Emaline Saran, MD while in the presence of Emaline Saran, MD.  I have reviewed the above documentation for accuracy and completeness, and I agree with the above.  Johanthan Kneeland, MD

## 2024-01-10 ENCOUNTER — Ambulatory Visit

## 2024-01-10 ENCOUNTER — Telehealth: Payer: Self-pay | Admitting: Hematology

## 2024-01-10 VITALS — Ht 62.5 in | Wt 119.0 lb

## 2024-01-10 DIAGNOSIS — Z0000 Encounter for general adult medical examination without abnormal findings: Secondary | ICD-10-CM

## 2024-01-10 NOTE — Telephone Encounter (Signed)
 Rebecca [name] called in to reschedule her appointment.

## 2024-01-10 NOTE — Progress Notes (Signed)
 Subjective:   Rebecca [name] is a 89 y.o. who presents for a Medicare Wellness preventive visit.  As a reminder, Annual Wellness Visits don't include a physical exam, and some assessments may be limited, especially if this visit is performed virtually. We may recommend an in-person follow-up visit with your provider if needed.  Visit Complete: Virtual I connected with  Rebecca [name] on 01/10/24 by a audio enabled telemedicine application and verified that I am speaking with the correct person using two identifiers.  Patient Location: Home  Provider Location: Office/Clinic  I discussed the limitations of evaluation and management by telemedicine. The patient expressed understanding and agreed to proceed.  Vital Signs: Because this visit was a virtual/telehealth visit, some criteria may be missing or patient reported. Any vitals not documented were not able to be obtained and vitals that have been documented are patient reported.  VideoDeclined- This patient declined Librarian, academic. Therefore the visit was completed with audio only.  Persons Participating in Visit: Patient.  AWV Questionnaire: No: Patient Medicare AWV questionnaire was not completed prior to this visit.  Cardiac Risk Factors include: advanced age (>56men, >33 women);hypertension;dyslipidemia     Objective:    Today's Vitals   01/10/24 1024  Weight: 119 lb (54 kg)  Height: 5' 2.5 (1.588 m)   Body mass index is 21.42 kg/m.     01/10/2024   10:33 AM 07/15/2023    2:21 PM 03/01/2023    2:45 PM 01/05/2023   11:06 AM 03/16/2022   10:14 AM 02/25/2022    5:13 PM 12/27/2021   10:31 AM  Advanced Directives  Does Patient Have a Medical Advance Directive? Yes No No Yes Yes Yes Yes  Type of Estate agent of [place];Living will   Living will  Healthcare Power of [place];Living will Healthcare Power of [place];Living will  Does patient want to make changes  to medical advance directive?     No - Patient declined    Copy of Healthcare Power of Attorney in Chart? No - copy requested   No - copy requested   No - copy requested  Would patient like information on creating a medical advance directive?  No - Patient declined         Current Medications (verified) Outpatient Encounter Medications as of 01/10/2024  Medication Sig   acetaminophen  (TYLENOL ) 500 MG tablet Take 500-1,000 mg by mouth every 6 (six) hours as needed for mild pain or headache.   ALPRAZolam  (XANAX ) 0.25 MG tablet TAKE 1 TABLET BY MOUTH AT BEDTIME AS NEEDED. DO NOT DRIVE FOR 8 HOURS AFTER TAKING AND STOP IF ANY FALLS UNTIL NEXT DOCTOR VISIT   atorvastatin  (LIPITOR) 10 MG tablet TAKE 1 TABLET(10 MG) BY MOUTH 3 TIMES A WEEK   Calcium  Carb-Cholecalciferol (CALCIUM -VITAMIN D ) 600-400 MG-UNIT TABS Take 1 tablet by mouth daily.   Cholecalciferol (VITAMIN D3) 2000 UNITS TABS Take 1 tablet by mouth daily.   diclofenac  sodium (VOLTAREN ) 1 % GEL Apply 2 g topically 4 (four) times daily.   esomeprazole  (NEXIUM ) 20 MG capsule Take 20 mg by mouth 2 (two) times daily before a meal.   estradiol (ESTRACE) 0.1 MG/GM vaginal cream Place 0.5 g vaginally See admin instructions. Place 0.5 g vaginally two to three times a week   levothyroxine  (SYNTHROID ) 88 MCG tablet Take 1 tablet (88 mcg total) by mouth daily before breakfast.   loperamide  (IMODIUM ) 2 MG capsule Take 1 capsule (2 mg total) by mouth 4 (four)  times daily as needed for diarrhea or loose stools.   losartan  (COZAAR ) 100 MG tablet Take 1 tablet (100 mg total) by mouth daily.   metoCLOPramide  (REGLAN ) 5 MG tablet TAKE 1 TABLET BY MOUTH TWICE DAILY WITH MEALS   ondansetron  (ZOFRAN -ODT) 4 MG disintegrating tablet Take 1 tablet (4 mg total) by mouth every 8 (eight) hours as needed for nausea or vomiting.   ROCKLATAN 0.02-0.005 % SOLN    timolol  (TIMOPTIC ) 0.5 % ophthalmic solution Place 1 drop into both eyes 2 (two) times daily.   vitamin B-12  (CYANOCOBALAMIN ) 500 MCG tablet Take 500 mcg by mouth every morning.   vitamin C (ASCORBIC ACID) 500 MG tablet Take 500 mg by mouth daily.   VYZULTA 0.024 % SOLN Place 1 drop into both eyes at bedtime.   [DISCONTINUED] olmesartan (BENICAR) 20 MG tablet Take 20 mg by mouth daily.     No facility-administered encounter medications on file as of 01/10/2024.    Allergies (verified) Levaquin [levofloxacin], Sulfamethoxazole-trimethoprim, Lactose intolerance (gi), Tramadol hcl, Augmentin  [amoxicillin -pot clavulanate], Ciprofloxacin, Rosuvastatin , Zosyn  [piperacillin  sod-tazobactam so], Adhesive [tape], Codeine, and Pneumococcal vaccine polyvalent   History: Past Medical History:  Diagnosis Date   Anemia    Anxiety    Bifascicular block    Diverticulitis    Diverticulosis    Gastroparesis    GERD (gastroesophageal reflux disease)    Glaucoma    Hemorrhoids    Hyperlipidemia    Hypertension    Hypertension    IBS (irritable bowel syndrome)    Rectal ulcer    SCOLIOSIS, LUMBAR SPINE 03/09/2010   Qualifier: Diagnosis of  By: Mavis MD, John E    Segmental colitis [facility])    Tubular adenoma of colon 05/2009   Past Surgical History:  Procedure Laterality Date   APPENDECTOMY     ARCUATE KERATECTOMY     BILIARY DILATION  03/27/2021   Procedure: BILIARY DILATION;  Surgeon: Teressa Toribio SQUIBB, MD;  Location: [facility];  Service: Gastroenterology;;   CATARACT EXTRACTION, BILATERAL  2015   CHOLECYSTECTOMY N/A 03/28/2021   Procedure: LAPAROSCOPIC CHOLECYSTECTOMY;  Surgeon: Debby Hila, MD;  Location: [facility];  Service: General;  Laterality: N/A;   double pallital tori bone removed      ERCP N/A 03/27/2021   Procedure: ENDOSCOPIC RETROGRADE CHOLANGIOPANCREATOGRAPHY (ERCP);  Surgeon: Teressa Toribio SQUIBB, MD;  Location: [facility];  Service: Gastroenterology;  Laterality: N/A;   HERNIA REPAIR     umbilical   KNEE SURGERY Bilateral    morton's neuroma on rt foot and platar facial release      MOUTH SURGERY     bone shaved off roof of mouth   REMOVAL OF STONES  03/27/2021   Procedure: REMOVAL OF STONES;  Surgeon: Teressa Toribio SQUIBB, MD;  Location: [facility];  Service: Gastroenterology;;   SALIVARY GLAND SURGERY     removal   SPHINCTEROTOMY  03/27/2021   Procedure: SPHINCTEROTOMY;  Surgeon: Teressa Toribio SQUIBB, MD;  Location: [facility];  Service: Gastroenterology;;   TONSILLECTOMY     Family History  Problem Relation Age of Onset   Diabetes Mother    Heart disease Mother    Hyperlipidemia Mother    Hypertension Mother    Anuerysm Father        abdominal- heavy smoker   Heart disease Father    Hypertension Father    Colon polyps Sister    Lung cancer Sister        smoker   Colon polyps Brother  Diabetes Brother    Hyperlipidemia Brother    Hypertension Brother    Heart disease Brother    Colon cancer Paternal Grandmother        with possible stomach cancer   Migraines Daughter    Kidney Stones Daughter    Colon polyps Daughter    Breast cancer Daughter    Cancer Daughter        salivary   Colon cancer Maternal Aunt        Rectal cancer   Crohn's disease Neg Hx    Pancreatic cancer Neg Hx    Social History   Socioeconomic History   Marital status: Widowed    Spouse name: Not on file   Number of children: 3   Years of education: Not on file   Highest education level: 12th grade  Occupational History   Occupation: Print production planner    Comment: retired  Tobacco Use   Smoking status: Never   Smokeless tobacco: Never  Vaping Use   Vaping status: Never Used  Substance and Sexual Activity   Alcohol use: Yes    Alcohol/week: 0.0 standard drinks of alcohol    Comment: rare   Drug use: No   Sexual activity: Not Currently  Other Topics Concern   Not on file  Social History Narrative   Widowed. 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 30+ years       Hobbies: Enjoys reading, especially mysteries, Walks for exercise   Attends church                  Social Drivers of Corporate investment banker Strain: Low Risk  (01/10/2024)   Overall Financial Resource Strain (CARDIA)    Difficulty of Paying Living Expenses: Not hard at all  Food Insecurity: No Food Insecurity (01/10/2024)   Hunger Vital Sign    Worried About Running Out of Food in the Last Year: Never true    Ran Out of Food in the Last Year: Never true  Transportation Needs: No Transportation Needs (01/10/2024)   PRAPARE - Administrator, Civil Service (Medical): No    Lack of Transportation (Non-Medical): No  Physical Activity: Sufficiently Active (01/10/2024)   Exercise Vital Sign    Days of Exercise per Week: 6 days    Minutes of Exercise per Session: 30 min  Stress: No Stress Concern Present (01/10/2024)   [employer] of Occupational Health - Occupational Stress Questionnaire    Feeling of Stress: Not at all  Social Connections: Socially Isolated (01/10/2024)   Social Connection and Isolation Panel    Frequency of Communication with Friends and Family: More than three times a week    Frequency of Social Gatherings with Friends and Family: Once a week    Attends Religious Services: Never    Database administrator or Organizations: No    Attends Banker Meetings: Never    Marital Status: Widowed    Tobacco Counseling Counseling given: Not Answered    Clinical Intake:  Pre-visit preparation completed: Yes  Pain : No/denies pain     BMI - recorded: 21.42 Nutritional Status: BMI of 19-24  Normal Nutritional Risks: None Diabetes: No  Lab Results  Component Value Date   HGBA1C 6.0 (H) 08/25/2023   HGBA1C 5.9 10/04/2022   HGBA1C 5.6 07/26/2006     How often do you need to have  someone help you when you read instructions, pamphlets, or other written materials from your doctor or pharmacy?: 1 - Never  Interpreter Needed?:  No  Information entered by :: Ellouise Haws, LPN   Activities of Daily Living     01/10/2024   10:31 AM  In your present state of health, do you have any difficulty performing the following activities:  Hearing? 0  Vision? 0  Difficulty concentrating or making decisions? 0  Walking or climbing stairs? 1  Comment uses a cane  Dressing or bathing? 0  Doing errands, shopping? 0  Preparing Food and eating ? N  Using the Toilet? N  In the past six months, have you accidently leaked urine? N  Do you have problems with loss of bowel control? N  Managing your Medications? N  Managing your Finances? N  Housekeeping or managing your Housekeeping? N    Patient Care Team: Katrinka Garnette KIDD, MD as PCP - General (Family Medicine) Aneita Gwendlyn DASEN, MD (Inactive) as Consulting Physician (Gastroenterology) Kandyce Sor, MD as Consulting Physician (Obstetrics and Gynecology) Leslee Reusing, MD as Consulting Physician (Ophthalmology) Liane Sharyne MATSU, [facility] (Inactive) as Pharmacist (Pharmacist)   I have updated your Care Teams any recent Medical Services you may have received from other providers in the past year.     Assessment:   This is a routine wellness examination for Rebecca [name].  Hearing/Vision screen Hearing Screening - Comments:: Pt denies any hearing issues  Vision Screening - Comments:: Wears rx glasses - up to date with routine eye exams with  Dr Reusing leslee    Goals Addressed             This Visit's Progress    Patient Stated       Maintain health and best as possible        Depression Screen      01/10/2024   10:30 AM 01/05/2023   11:15 AM 09/28/2022    2:05 PM 12/27/2021   10:29 AM 09/16/2021   11:39 AM 05/18/2021   10:26 AM 04/09/2021   11:05 AM  PHQ 2/9 Scores  PHQ - 2 Score 0 0 0 0 0 0 0  PHQ- 9 Score 0 0   0 0 3    Fall Risk      01/10/2024   10:33 AM 01/05/2023   11:04 AM 09/28/2022    2:05 PM 12/27/2021   10:32 AM 09/16/2021   11:40 AM   Fall Risk   Falls in the past year? 1 0 0 1 0  Number falls in past yr: 0 0 0 1 0  Injury with Fall? 0 0 0 0 0  Risk for fall due to : History of fall(s)  No Fall Risks Impaired vision No Fall Risks  Follow up Falls prevention discussed Falls evaluation completed;Education provided;Falls prevention discussed Falls evaluation completed Falls prevention discussed  Falls evaluation completed      Data saved with a previous flowsheet row definition    MEDICARE RISK AT HOME:   Medicare Risk at Home Any stairs in or around the home?: No If so, are there any without handrails?: No Home free of loose throw rugs in walkways, pet beds, electrical cords, etc?: Yes Adequate lighting in your home to reduce risk of falls?: Yes Life alert?: No Use of a cane, walker or w/c?: Yes Grab bars in the bathroom?: Yes Shower chair or bench in shower?: Yes Elevated toilet seat or a handicapped toilet?: No  TIMED UP AND  GO:  Was the test performed?  No  Cognitive Function: 6CIT completed    02/08/2017    1:55 PM  MMSE - Mini Mental State Exam  Not completed: --        01/10/2024   10:34 AM 01/05/2023   11:08 AM 12/27/2021   10:33 AM 11/19/2019   11:34 AM  6CIT Screen  What Year? 0 points 0 points 0 points 0 points  What month? 0 points 0 points 0 points 0 points  What time? 0 points 0 points -- 0 points  Count back from 20 0 points 0 points -- 0 points  Months in reverse 0 points 0 points -- 0 points  Repeat phrase 0 points 0 points -- 0 points  Total Score 0 points 0 points  0 points    Immunizations Immunization History  Administered Date(s) Administered   Fluad Quad(high Dose 65+) 12/31/2018, 01/06/2021   Fluad Trivalent(High Dose 65+) 01/31/2023   H1N1 06/02/2008   INFLUENZA, HIGH DOSE SEASONAL PF 12/31/2014, 01/01/2016, 01/11/2017, 01/02/2018, 01/16/2022   Influenza Split 12/29/2010   Influenza Whole 04/04/2001, 02/01/2007, 12/24/2007, 12/16/2008, 01/15/2009, 01/05/2010    Influenza,inj,Quad PF,6+ Mos 12/19/2012, 12/25/2013   Influenza-Unspecified 02/10/2020   PFIZER Comirnaty(Gray Top)Covid-19 Tri-Sucrose Vaccine 01/06/2022   PFIZER(Purple Top)SARS-COV-2 Vaccination 05/06/2019, 05/27/2019, 02/10/2020, 01/10/2021, 12/14/2022   Pneumococcal Polysaccharide-23 03/05/1999, 04/04/2001   Td 04/05/1999, 07/28/2008   Tdap 04/04/2008   Zoster Recombinant(Shingrix) 08/04/2016, 10/12/2016   Zoster, Live 10/03/2007, 10/24/2007    Screening Tests Health Maintenance  Topic Date Due   Pneumococcal Vaccine: 50+ Years (3 of 3 - PCV) 04/04/2002   COVID-19 Vaccine (7 - 2025-26 season) 12/04/2023   Influenza Vaccine  07/02/2024 (Originally 11/03/2023)   Medicare Annual Wellness (AWV)  01/09/2025   DEXA SCAN  Completed   Zoster Vaccines- Shingrix  Completed   Meningococcal B Vaccine  Aged Out   DTaP/Tdap/Td  Discontinued   Mammogram  Discontinued    Health Maintenance Items Addressed: See Nurse Notes at the end of this note  Additional Screening:  Vision Screening: Recommended annual ophthalmology exams for early detection of glaucoma and other disorders of the eye. Is the patient up to date with their annual eye exam?  Yes  Who is the provider or what is the name of the office in which the patient attends annual eye exams? Dr Wanda Mae   Dental Screening: Recommended annual dental exams for proper oral hygiene  Community Resource Referral / Chronic Care Management: CRR required this visit?  No   CCM required this visit?  No   Plan:    I have personally reviewed and noted the following in the patient's chart:   Medical and social history Use of alcohol, tobacco or illicit drugs  Current medications and supplements including opioid prescriptions. Patient is not currently taking opioid prescriptions. Functional ability and status Nutritional status Physical activity Advanced directives List of other physicians Hospitalizations, surgeries, and ER  visits in previous 12 months Vitals Screenings to include cognitive, depression, and falls Referrals and appointments  In addition, I have reviewed and discussed with patient certain preventive protocols, quality metrics, and best practice recommendations. A written personalized care plan for preventive services as well as general preventive health recommendations were provided to patient.   Ellouise VEAR Haws, LPN   [id number]/04/[id number]   After Visit Summary: (MyChart) Due to this being a telephonic visit, the after visit summary with patients personalized plan was offered to patient via MyChart   Notes: Nothing significant to report  at this time.

## 2024-01-10 NOTE — Patient Instructions (Signed)
 Rebecca [name],  Thank you for taking the time for your Medicare Wellness Visit. I appreciate your continued commitment to your health goals. Please review the care plan we discussed, and feel free to reach out if I can assist you further.  Medicare recommends these wellness visits once per year to help you and your care team stay ahead of potential health issues. These visits are designed to focus on prevention, allowing your provider to concentrate on managing your acute and chronic conditions during your regular appointments.  Please note that Annual Wellness Visits do not include a physical exam. Some assessments may be limited, especially if the visit was conducted virtually. If needed, we may recommend a separate in-person follow-up with your provider.  Ongoing Care Seeing your primary care provider every 3 to 6 months helps us  monitor your health and provide consistent, personalized care.   Referrals If a referral was made during today's visit and you haven't received any updates within two weeks, please contact the referred provider directly to check on the status.  Recommended Screenings:  Health Maintenance  Topic Date Due   Pneumococcal Vaccine for age over 60 (3 of 3 - PCV) 04/04/2002   COVID-19 Vaccine (7 - 2025-26 season) 12/04/2023   Flu Shot  07/02/2024*   Medicare Annual Wellness Visit  01/09/2025   DEXA scan (bone density measurement)  Completed   Zoster (Shingles) Vaccine  Completed   Meningitis B Vaccine  Aged Out   DTaP/Tdap/Td vaccine  Discontinued   Breast Cancer Screening  Discontinued  *Topic was postponed. The date shown is not the original due date.       01/10/2024   10:33 AM  Advanced Directives  Does Patient Have a Medical Advance Directive? Yes  Type of Estate agent of [place];Living will  Copy of Healthcare Power of Attorney in Chart? No - copy requested   Advance Care Planning is important because it: Ensures you receive  medical care that aligns with your values, goals, and preferences. Provides guidance to your family and loved ones, reducing the emotional burden of decision-making during critical moments.  Vision: Annual vision screenings are recommended for early detection of glaucoma, cataracts, and diabetic retinopathy. These exams can also reveal signs of chronic conditions such as diabetes and high blood pressure.  Dental: Annual dental screenings help detect early signs of oral cancer, gum disease, and other conditions linked to overall health, including heart disease and diabetes.  Please see the attached documents for additional preventive care recommendations.

## 2024-01-19 DIAGNOSIS — Z23 Encounter for immunization: Secondary | ICD-10-CM | POA: Diagnosis not present

## 2024-02-05 ENCOUNTER — Encounter: Payer: Self-pay | Admitting: Radiology

## 2024-02-06 DIAGNOSIS — L821 Other seborrheic keratosis: Secondary | ICD-10-CM | POA: Diagnosis not present

## 2024-02-06 DIAGNOSIS — L905 Scar conditions and fibrosis of skin: Secondary | ICD-10-CM | POA: Diagnosis not present

## 2024-02-06 DIAGNOSIS — Z85828 Personal history of other malignant neoplasm of skin: Secondary | ICD-10-CM | POA: Diagnosis not present

## 2024-02-06 DIAGNOSIS — L820 Inflamed seborrheic keratosis: Secondary | ICD-10-CM | POA: Diagnosis not present

## 2024-02-09 DIAGNOSIS — Z23 Encounter for immunization: Secondary | ICD-10-CM | POA: Diagnosis not present

## 2024-02-20 ENCOUNTER — Other Ambulatory Visit: Payer: Self-pay | Admitting: Family Medicine

## 2024-02-27 ENCOUNTER — Ambulatory Visit: Admitting: Physician Assistant

## 2024-03-14 ENCOUNTER — Telehealth: Payer: Self-pay | Admitting: Family Medicine

## 2024-03-14 NOTE — Telephone Encounter (Signed)
 Yes these medications have potential benefits but potential risks as well and would want to go over those

## 2024-03-14 NOTE — Telephone Encounter (Signed)
 Pt states can not come in nor do a vv. States will just wait until her next scheduled appt in Feb

## 2024-03-14 NOTE — Telephone Encounter (Signed)
 Noted! Thank you

## 2024-03-14 NOTE — Telephone Encounter (Signed)
 Patient is requesting gabapentin  and a medication for vertigo she saw on the news. Would you like me to make her an appointment to come in?    Copied from CRM #[id number]. Topic: Clinical - Medication Question >> Mar 13, 2024  3:38 PM Burnard DEL wrote: Reason for CRM: Patient is requesting a phone call to ask about two medications that she would ,like to know if she could have prescribed to her.She did not specify names of medications.

## 2024-03-27 ENCOUNTER — Ambulatory Visit: Payer: Self-pay

## 2024-03-27 NOTE — Telephone Encounter (Signed)
 FYI Only or Action Required?: FYI only for provider: appointment scheduled on 03/29/2024.  Patient was last seen in primary care on 11/28/2023 by Katrinka Garnette KIDD, MD.  Called Nurse Triage reporting Pain.  Symptoms began today.  Interventions attempted: Nothing.  Symptoms are: gradually worsening.  Triage Disposition: See PCP When Office is Open (Within 3 Days)  Patient/caregiver understands and will follow disposition?: Yes    Copied from CRM #[id number]. Topic: Clinical - Red Word Triage >> Mar 27, 2024 11:39 AM Harlene ORN wrote: Red Word that prompted transfer to Nurse Triage: bladder infection started this morning Reason for Disposition  [1] Pain with sexual intercourse (dyspareunia) AND [2] new-onset (in past 4 weeks)  Answer Assessment - Initial Assessment Questions 1. LOCATION: Where does it hurt?      Pelvic pain & bladder pain 2. RADIATION: Does the pain shoot anywhere else? (e.g., lower back, groin, thighs)     na 3. ONSET: When did the pain begin? (e.g., minutes, hours or days ago)      today 4. SUDDEN: Gradual or sudden onset?     sudden 5. PATTERN Does the pain come and go, or is it constant?     constant 6. SEVERITY: How bad is the pain?  (e.g., Scale 1-10; mild, moderate, or severe)     moderate 7. RECURRENT SYMPTOM: Have you ever had this type of pelvic pain before? If Yes, ask: When was the last time? and What happened that time?      Yes - ABX 8. CAUSE: What do you think is causing the pelvic pain?     UTI 9. RELIEVING/AGGRAVATING FACTORS: What makes it better or worse? (e.g., activity/rest, sexual intercourse, voiding, passing stool)     na 10. OTHER SYMPTOMS: Has there been any other symptoms? (e.g., fever, constipation, diarrhea, urine problems, vaginal bleeding, vaginal discharge, or vomiting?       na 11. PREGNANCY: Is there any chance you are pregnant? When was your last menstrual period?       na  Protocols used: Pelvic  Pain - Female-A-AH

## 2024-03-29 ENCOUNTER — Encounter: Payer: Self-pay | Admitting: Family Medicine

## 2024-03-29 ENCOUNTER — Ambulatory Visit: Admitting: Family Medicine

## 2024-03-29 VITALS — BP 132/74 | HR 63 | Temp 98.1°F | Ht 62.5 in | Wt 124.2 lb

## 2024-03-29 DIAGNOSIS — R300 Dysuria: Secondary | ICD-10-CM | POA: Diagnosis not present

## 2024-03-29 LAB — POCT URINALYSIS DIP (CLINITEK)
Bilirubin, UA: NEGATIVE
Blood, UA: NEGATIVE
Glucose, UA: NEGATIVE mg/dL
Ketones, POC UA: NEGATIVE mg/dL
Leukocytes, UA: NEGATIVE
Nitrite, UA: NEGATIVE
POC PROTEIN,UA: NEGATIVE
Spec Grav, UA: 1.01
Urobilinogen, UA: 0.2 U/dL
pH, UA: 6

## 2024-03-29 MED ORDER — CEPHALEXIN 500 MG PO CAPS: 500.0000 mg | ORAL_CAPSULE | Freq: Two times a day (BID) | ORAL | 0 refills | Status: AC

## 2024-03-29 NOTE — Patient Instructions (Addendum)
 I have sent in keflex for you to take twice a day for 5 days.   Please eat when you take this medication, it can upset your stomach if you do not.   Please be sure to complete the course of antibiotics even if you are feeling better.   Follow-up with me for new or worsening symptoms.

## 2024-03-29 NOTE — Telephone Encounter (Signed)
 Patient has appointment for today at internal medicine

## 2024-03-29 NOTE — Progress Notes (Signed)
 "  Acute Office Visit  Subjective:     Patient ID: Rebecca [name], female    DOB: 1932-06-12, 89 y.o.   MRN: [id number]  Chief Complaint  Patient presents with   Acute Visit    C/o lower stomach pain, frequent urination, and not feeling well ongoing since Tuesday     HPI  89 year old female presents for evaluation of suprapubic abdominal pain, frequent urination, generalized fatigue and malaise for the last 3 days. Has history of recurrent UTIs. Denies nausea, vomiting, diarrhea, rash, fever, chills, other symptoms. She is companied by her daughter today.  ROS Per HPI      Objective:    BP 132/74   Pulse 63   Temp 98.1 F (36.7 C) (Temporal)   Ht 5' 2.5 (1.588 m)   Wt 124 lb 3.2 oz (56.3 kg)   SpO2 98%   BMI 22.35 kg/m    Physical Exam Vitals and nursing note reviewed.  Constitutional:      General: She is not in acute distress.    Comments: Elderly  HENT:     Head: Normocephalic and atraumatic.     Right Ear: External ear normal.     Left Ear: External ear normal.     Nose: Nose normal.     Mouth/Throat:     Mouth: Mucous membranes are moist.     Pharynx: Oropharynx is clear.  Eyes:     Extraocular Movements: Extraocular movements intact.     Pupils: Pupils are equal, round, and reactive to light.  Cardiovascular:     Rate and Rhythm: Normal rate and regular rhythm.  Pulmonary:     Effort: Pulmonary effort is normal.  Musculoskeletal:     Cervical back: Normal range of motion.     Right lower leg: No edema.     Left lower leg: No edema.  Lymphadenopathy:     Cervical: No cervical adenopathy.  Neurological:     General: No focal deficit present.     Mental Status: She is alert and oriented to person, place, and time.  Psychiatric:        Mood and Affect: Mood normal.        Thought Content: Thought content normal.     Results for orders placed or performed in visit on 03/29/24  POCT URINALYSIS DIP (CLINITEK)  Result Value Ref Range    Color, UA yellow yellow   Clarity, UA clear clear   Glucose, UA negative negative mg/dL   Bilirubin, UA negative negative   Ketones, POC UA negative negative mg/dL   Spec Grav, UA 8.989 8.989 - 1.025   Blood, UA negative negative   pH, UA 6.0 5.0 - 8.0   POC PROTEIN,UA negative negative, trace   Urobilinogen, UA 0.2 0.2 or 1.0 E.U./dL   Nitrite, UA Negative Negative   Leukocytes, UA Negative Negative        Assessment & Plan:  Dysuria -     Urine Culture; Future -     POCT URINALYSIS DIP (CLINITEK) -     Cephalexin ; Take 1 capsule (500 mg total) by mouth 2 (two) times daily for 5 days.  Dispense: 10 capsule; Refill: 0  Treating with antibiotics given recurrent UTI history as well as symptoms      Orders Placed This Encounter  Procedures   Urine Culture    Standing Status:   Future    Number of Occurrences:   1    Expected Date:   03/29/2024  Expiration Date:   03/29/2025   POCT URINALYSIS DIP (CLINITEK)     Meds ordered this encounter  Medications   cephALEXin  (KEFLEX ) 500 MG capsule    Sig: Take 1 capsule (500 mg total) by mouth 2 (two) times daily for 5 days.    Dispense:  10 capsule    Refill:  0    Return if symptoms worsen or fail to improve.  Corean LITTIE Ku, FNP  "

## 2024-03-30 LAB — URINE CULTURE: Result:: NO GROWTH

## 2024-04-01 ENCOUNTER — Ambulatory Visit: Payer: Self-pay | Admitting: Family Medicine

## 2024-04-09 ENCOUNTER — Ambulatory Visit: Admitting: Hematology

## 2024-04-09 ENCOUNTER — Other Ambulatory Visit

## 2024-04-11 ENCOUNTER — Other Ambulatory Visit: Payer: Self-pay

## 2024-04-11 DIAGNOSIS — C9110 Chronic lymphocytic leukemia of B-cell type not having achieved remission: Secondary | ICD-10-CM

## 2024-04-12 ENCOUNTER — Inpatient Hospital Stay: Attending: Hematology

## 2024-04-12 ENCOUNTER — Inpatient Hospital Stay: Admitting: Hematology

## 2024-04-12 VITALS — BP 152/66 | HR 58 | Resp 17 | Ht 62.5 in

## 2024-04-12 DIAGNOSIS — C9110 Chronic lymphocytic leukemia of B-cell type not having achieved remission: Secondary | ICD-10-CM | POA: Insufficient documentation

## 2024-04-12 DIAGNOSIS — R001 Bradycardia, unspecified: Secondary | ICD-10-CM | POA: Insufficient documentation

## 2024-04-12 DIAGNOSIS — E039 Hypothyroidism, unspecified: Secondary | ICD-10-CM | POA: Insufficient documentation

## 2024-04-12 DIAGNOSIS — G629 Polyneuropathy, unspecified: Secondary | ICD-10-CM | POA: Insufficient documentation

## 2024-04-12 DIAGNOSIS — E871 Hypo-osmolality and hyponatremia: Secondary | ICD-10-CM | POA: Insufficient documentation

## 2024-04-12 LAB — CMP (CANCER CENTER ONLY)
ALT: 16 U/L (ref 0–44)
AST: 30 U/L (ref 15–41)
Albumin: 4.4 g/dL (ref 3.5–5.0)
Alkaline Phosphatase: 75 U/L (ref 38–126)
Anion gap: 10 (ref 5–15)
BUN: 13 mg/dL (ref 8–23)
CO2: 24 mmol/L (ref 22–32)
Calcium: 10 mg/dL (ref 8.9–10.3)
Chloride: 101 mmol/L (ref 98–111)
Creatinine: 0.98 mg/dL (ref 0.44–1.00)
GFR, Estimated: 55 mL/min — ABNORMAL LOW
Glucose, Bld: 126 mg/dL — ABNORMAL HIGH (ref 70–99)
Potassium: 4.1 mmol/L (ref 3.5–5.1)
Sodium: 135 mmol/L (ref 135–145)
Total Bilirubin: 0.4 mg/dL (ref 0.0–1.2)
Total Protein: 6.3 g/dL — ABNORMAL LOW (ref 6.5–8.1)

## 2024-04-12 LAB — CBC WITH DIFFERENTIAL (CANCER CENTER ONLY)
Abs Immature Granulocytes: 0.03 K/uL (ref 0.00–0.07)
Basophils Absolute: 0.1 K/uL (ref 0.0–0.1)
Basophils Relative: 0 %
Eosinophils Absolute: 0.2 K/uL (ref 0.0–0.5)
Eosinophils Relative: 1 %
HCT: 34.2 % — ABNORMAL LOW (ref 36.0–46.0)
Hemoglobin: 11.5 g/dL — ABNORMAL LOW (ref 12.0–15.0)
Immature Granulocytes: 0 %
Lymphocytes Relative: 77 %
Lymphs Abs: 20.3 K/uL — ABNORMAL HIGH (ref 0.7–4.0)
MCH: 34.2 pg — ABNORMAL HIGH (ref 26.0–34.0)
MCHC: 33.6 g/dL (ref 30.0–36.0)
MCV: 101.8 fL — ABNORMAL HIGH (ref 80.0–100.0)
Monocytes Absolute: 4 K/uL — ABNORMAL HIGH (ref 0.1–1.0)
Monocytes Relative: 15 %
Neutro Abs: 1.9 K/uL (ref 1.7–7.7)
Neutrophils Relative %: 7 %
Platelet Count: 108 K/uL — ABNORMAL LOW (ref 150–400)
RBC: 3.36 MIL/uL — ABNORMAL LOW (ref 3.87–5.11)
RDW: 13.2 % (ref 11.5–15.5)
Smear Review: NORMAL
WBC Count: 26.4 K/uL — ABNORMAL HIGH (ref 4.0–10.5)
nRBC: 0 % (ref 0.0–0.2)

## 2024-04-12 LAB — LACTATE DEHYDROGENASE: LDH: 169 U/L (ref 105–235)

## 2024-04-12 NOTE — Progress Notes (Signed)
 " HEMATOLOGY ONCOLOGY PROGRESS NOTE  Date of service: 04/12/2024  Patient Care Team: Katrinka Garnette KIDD, MD as PCP - General (Family Medicine) Aneita Gwendlyn DASEN, MD (Inactive) as Consulting Physician (Gastroenterology) Kandyce Sor, MD as Consulting Physician (Obstetrics and Gynecology) Leslee Reusing, MD as Consulting Physician (Ophthalmology) Liane Sharyne MATSU, [facility] (Inactive) as Pharmacist (Pharmacist)  CHIEF COMPLAINT/PURPOSE OF CONSULTATION: Follow-up for continued evaluation and management of CLL.  HISTORY OF PRESENTING ILLNESS: Rebecca [name] is a wonderful 89 y.o. female who has been referred to us  by Dr Bernarda Ned MD for evaluation and management of lymphocytosis.   Patient has a history of hypertension, GERD, hypothyroidism was noted to have biliary colic in November 2022 and referred for surgical evaluation due to persistent and worsening symptoms.  In the emergency room the patient was noted to have elevated LFTs and significant leukocytosis of 27.4k.  CT scan showed cholelithiasis, ultrasound abdomen showed cholelithiasis without acute cholecystitis.  MRCP showed distended CBD with choledocholithiasis.  Patient subsequently had ERCP on 12/24 with biliary sphincterotomy and subsequently had her laparoscopic cholecystectomy on 03/28/2021.   Surgical pathology from her cholecystectomy showed chronic cholecystitis without cholelithiasis. 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 Labs ordered 04/09/2021 showed persistent leukocytosis of 19.2k with lymphocyte count of 15.2k hemoglobin of 11.1 and platelets of 219k.   Patient was referred to us  for further evaluation of CD5 positive lymphoproliferative disorder.   Patient notes no fevers no chills no night sweats. No new lumps or bumps. CT abdomen 04/15/2020 showed enlarged abdominal pelvic lymph nodes.   SUMMARY OF ONCOLOGIC HISTORY: Oncology History   No problem history exists.     INTERVAL HISTORY:  Rebecca [name] is a 89 y.o. female who is here today for continued evaluation and management of CLL. She is accompanied by her daughter and is ambulating with a single crutch today.  she was last seen by me on 01/08/2024; at the time she did not have any concerns and was doing well.   Today, she reports she is doing well. She reports some neuropathy in her feet and uses one crutch for balance.   She denies any fevers/chills, drenching night sweats, abdominal pain, new lumps/bumps, leg swelling, bleeding issues (nose bleeds, gum bleeds, abnormal/spontaneous bruising), and change in breathing.     REVIEW OF SYSTEMS:   10 Point review of systems of done and is negative except as noted above.  MEDICAL HISTORY Past Medical History:  Diagnosis Date   Anemia    Anxiety    Bifascicular block    Diverticulitis    Diverticulosis    Gastroparesis    GERD (gastroesophageal reflux disease)    Glaucoma    Hemorrhoids    Hyperlipidemia    Hypertension    Hypertension    IBS (irritable bowel syndrome)    Rectal ulcer    SCOLIOSIS, LUMBAR SPINE 03/09/2010   Qualifier: Diagnosis of  By: Mavis MD, John E    Segmental colitis [facility])    Tubular adenoma of colon 05/2009    SURGICAL HISTORY Past Surgical History:  Procedure Laterality Date   APPENDECTOMY     ARCUATE KERATECTOMY     BILIARY DILATION  03/27/2021   Procedure: BILIARY DILATION;  Surgeon: Teressa Toribio SQUIBB, MD;  Location: [facility];  Service: Gastroenterology;;   CATARACT EXTRACTION, BILATERAL  2015   CHOLECYSTECTOMY N/A 03/28/2021   Procedure: LAPAROSCOPIC CHOLECYSTECTOMY;  Surgeon: Ned Bernarda, MD;  Location: [facility];  Service: General;  Laterality: N/A;  double pallital tori bone removed      ERCP N/A 03/27/2021   Procedure: ENDOSCOPIC RETROGRADE CHOLANGIOPANCREATOGRAPHY (ERCP);  Surgeon: Teressa Toribio SQUIBB, MD;  Location: [facility];  Service: Gastroenterology;  Laterality: N/A;   HERNIA  REPAIR     umbilical   KNEE SURGERY Bilateral    morton's neuroma on rt foot and platar facial release     MOUTH SURGERY     bone shaved off roof of mouth   REMOVAL OF STONES  03/27/2021   Procedure: REMOVAL OF STONES;  Surgeon: Teressa Toribio SQUIBB, MD;  Location: [facility];  Service: Gastroenterology;;   SALIVARY GLAND SURGERY     removal   SPHINCTEROTOMY  03/27/2021   Procedure: SPHINCTEROTOMY;  Surgeon: Teressa Toribio SQUIBB, MD;  Location: [facility];  Service: Gastroenterology;;   TONSILLECTOMY      SOCIAL HISTORY Social History[1]  Social History   Social History Narrative   Widowed. 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      Hobbies: Enjoys reading, especially mysteries, Walks for exercise   Attends church                   SOCIAL DRIVERS OF HEALTH SDOH Screenings   Food Insecurity: No Food Insecurity (01/10/2024)  Housing: Unknown (01/10/2024)  Transportation Needs: No Transportation Needs (01/10/2024)  Utilities: Not At Risk (01/10/2024)  Alcohol Screen: Low Risk (01/10/2024)  Depression (PHQ2-9): Low Risk (01/10/2024)  Financial Resource Strain: Low Risk (01/10/2024)  Physical Activity: Sufficiently Active (01/10/2024)  Social Connections: Socially Isolated (01/10/2024)  Stress: No Stress Concern Present (01/10/2024)  Tobacco Use: Low Risk (03/29/2024)  Health Literacy: Adequate Health Literacy (01/10/2024)     FAMILY HISTORY Family History  Problem Relation Age of Onset   Diabetes Mother    Heart disease Mother    Hyperlipidemia Mother    Hypertension Mother    Anuerysm Father        abdominal- heavy smoker   Heart disease Father    Hypertension Father    Colon polyps Sister    Lung cancer Sister        smoker   Colon polyps Brother    Diabetes Brother    Hyperlipidemia Brother    Hypertension Brother    Heart disease Brother     Colon cancer Paternal Grandmother        with possible stomach cancer   Migraines Daughter    Kidney Stones Daughter    Colon polyps Daughter    Breast cancer Daughter    Cancer Daughter        salivary   Colon cancer Maternal Aunt        Rectal cancer   Crohn's disease Neg Hx    Pancreatic cancer Neg Hx      ALLERGIES: is allergic to levaquin [levofloxacin], sulfamethoxazole-trimethoprim, lactose intolerance (gi), tramadol hcl, augmentin  [amoxicillin -pot clavulanate], ciprofloxacin, rosuvastatin , zosyn  [piperacillin  sod-tazobactam so], adhesive [tape], codeine, and pneumococcal vaccine polyvalent.  MEDICATIONS  Current Outpatient Medications  Medication Sig Dispense Refill   acetaminophen  (TYLENOL ) 500 MG tablet Take 500-1,000 mg by mouth every 6 (six) hours as needed for mild pain or headache.     ALPRAZolam  (XANAX ) 0.25 MG tablet TAKE 1 TABLET BY MOUTH AT BEDTIME AS NEEDED. DO NOT DRIVE FOR 8 HOURS AFTER TAKING AND STOP IF ANY FALLS UNTIL NEXT DOCTOR VISIT 20 tablet 1   atorvastatin  (LIPITOR) 10  MG tablet TAKE 1 TABLET(10 MG) BY MOUTH 3 TIMES A WEEK 39 tablet 3   Calcium  Carb-Cholecalciferol (CALCIUM -VITAMIN D ) 600-400 MG-UNIT TABS Take 1 tablet by mouth daily.     Cholecalciferol (VITAMIN D3) 2000 UNITS TABS Take 1 tablet by mouth daily.     diclofenac  sodium (VOLTAREN ) 1 % GEL Apply 2 g topically 4 (four) times daily. (Patient not taking: Reported on 03/29/2024) 1 Tube 1   esomeprazole  (NEXIUM ) 20 MG capsule Take 20 mg by mouth 2 (two) times daily before a meal.     estradiol (ESTRACE) 0.1 MG/GM vaginal cream Place 0.5 g vaginally See admin instructions. Place 0.5 g vaginally two to three times a week     levothyroxine  (SYNTHROID ) 88 MCG tablet Take 1 tablet (88 mcg total) by mouth daily before breakfast. 90 tablet 3   loperamide  (IMODIUM ) 2 MG capsule Take 1 capsule (2 mg total) by mouth 4 (four) times daily as needed for diarrhea or loose stools. 12 capsule 0   losartan  (COZAAR )  100 MG tablet Take 1 tablet (100 mg total) by mouth daily. 90 tablet 3   metoCLOPramide  (REGLAN ) 5 MG tablet TAKE 1 TABLET BY MOUTH TWICE DAILY WITH MEALS 90 tablet 3   ondansetron  (ZOFRAN -ODT) 4 MG disintegrating tablet Take 1 tablet (4 mg total) by mouth every 8 (eight) hours as needed for nausea or vomiting. 20 tablet 0   ROCKLATAN 0.02-0.005 % SOLN  (Patient not taking: Reported on 03/29/2024)     timolol  (TIMOPTIC ) 0.5 % ophthalmic solution Place 1 drop into both eyes 2 (two) times daily.     vitamin B-12 (CYANOCOBALAMIN ) 500 MCG tablet Take 500 mcg by mouth every morning.     vitamin C (ASCORBIC ACID) 500 MG tablet Take 500 mg by mouth daily.     VYZULTA 0.024 % SOLN Place 1 drop into both eyes at bedtime.     No current facility-administered medications for this visit.    PHYSICAL EXAMINATION: ECOG PERFORMANCE STATUS: 1 - Symptomatic but completely ambulatory VITALS: Vitals:   04/12/24 1402 04/12/24 1409  BP: (!) 151/65 (!) 152/66  Pulse: (!) 58   Resp: 17   SpO2: 96%    There were no vitals filed for this visit. Body mass index is 22.35 kg/m.  GENERAL: alert, in no acute distress and comfortable SKIN: no acute rashes, no significant lesions EYES: conjunctiva are pink and non-injected, sclera anicteric OROPHARYNX: MMM, no exudates, no oropharyngeal erythema or ulceration NECK: supple, no JVD LYMPH:  no palpable lymphadenopathy in the cervical, axillary or inguinal regions LUNGS: clear to auscultation b/l with normal respiratory effort HEART: regular rate & rhythm ABDOMEN:  normoactive bowel sounds , non tender, not distended, no hepatosplenomegaly Extremity: no pedal edema PSYCH: alert & oriented x 3 with fluent speech NEURO: no focal motor/sensory deficits  LABORATORY DATA:   I have reviewed the data as listed     Latest Ref Rng & Units 04/12/2024    1:32 PM 01/05/2024    2:34 PM 11/28/2023   11:55 AM  CBC EXTENDED  WBC 4.0 - 10.5 K/uL 26.4  25.2  26.3   RBC 3.87  - 5.11 MIL/uL 3.36  3.59  3.45   Hemoglobin 12.0 - 15.0 g/dL 88.4  87.6  88.2   HCT 36.0 - 46.0 % 34.2  36.4  35.4   Platelets 150 - 400 K/uL 108  106  125.0   NEUT# 1.7 - 7.7 K/uL 1.9  2.3  1.7   Lymph# 0.7 -  4.0 K/uL 20.3  22.2  23.9        Latest Ref Rng & Units 04/12/2024    1:32 PM 01/05/2024    2:34 PM 11/28/2023   11:55 AM  CMP  Glucose 70 - 99 mg/dL 873  95  891   BUN 8 - 23 mg/dL 13  11  16    Creatinine 0.44 - 1.00 mg/dL 9.01  9.07  9.00   Sodium 135 - 145 mmol/L 135  132  134   Potassium 3.5 - 5.1 mmol/L 4.1  4.2  4.4   Chloride 98 - 111 mmol/L 101  99  98   CO2 22 - 32 mmol/L 24  28  25    Calcium  8.9 - 10.3 mg/dL 89.9  89.2  9.7   Total Protein 6.5 - 8.1 g/dL 6.3  6.6  6.2   Total Bilirubin 0.0 - 1.2 mg/dL 0.4  0.5  0.7   Alkaline Phos 38 - 126 U/L 75  69  57   AST 15 - 41 U/L 30  23  21    ALT 0 - 44 U/L 16  16  14       RADIOGRAPHIC STUDIES: I have personally reviewed the radiological images as listed and agreed with the findings in the report. No results found.  ASSESSMENT & PLAN:  89 y.o. 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   2) CD5 positive lymphoid infiltrate in gallbladder pathology suggestive of involvement by CLL/SLL -CT abdomen was done recently on 03/25/2021-did not show significant lymphadenopathy or hepatosplenomegaly.  Noted to have some lung nodules in the lower lobes.   3) .  Basal cell carcinoma on the right side of the nose Seeing Dr. Rolan Molt at [facility] dermatology for management  -Continue active surveillance for recurrent skin cancers since the CLL especially with 13 q. deletion would be a risk factor.   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   5) hyponatremia-this appears chronic for about a year with sodium levels fluctuating from 125-133.  Likely multifactorial including poor solute intake and potentially from her hypothyroidism.   6) chronic bradycardia -likely  multifactorial.  Also has hypothyroidism.  PLAN: - Discussed lab results on 04/12/2024 in detail with patient: -CBC looks stable today -Hemoglobin is 11.5 -WBC is the same at 26.4k -Platelets are steady at 108k -Kidney levels are good  LDH is steady Calcium  levels have improved with her increased fluid intake and cessation of calcium  supplements Recommended 60-64oz of water per day Discussed walker vs single crutch usage RTC every 6 months in conjunction with PCP visits biannually   FOLLOW-UP in 6 months for labs and follow-up with Dr. Onesimo.  The total time spent in the appointment was 23 minutes* .  All of the patient's questions were answered and the patient knows to call the clinic with any problems, questions, or concerns.  Emaline Onesimo MD MS AAHIVMS [facility] Hematology/Oncology Physician [facility]  *Total Encounter Time as defined by the Centers for Medicare and Medicaid Services includes, in addition to the face-to-face time of a patient visit (documented in the note above) non-face-to-face time: obtaining and reviewing outside history, ordering and reviewing medications, tests or procedures, care coordination (communications with other health care professionals or caregivers) and documentation in the medical record.  I, Alan Blowers, acting as a neurosurgeon for Rochell Puett, MD.,have documented all relevant documentation on the behalf of Emaline Onesimo, MD,as directed  by  Emaline Saran, MD while in the presence of Emaline Saran, MD.  I have reviewed the above documentation for accuracy and completeness, and I agree with the above.  Emaline Saran, MD     [1]  Social History Tobacco Use   Smoking status: Never   Smokeless tobacco: Never  Vaping Use   Vaping status: Never Used  Substance Use Topics   Alcohol use: Yes    Alcohol/week: 0.0 standard drinks of alcohol    Comment: rare   Drug use: No   "

## 2024-05-01 ENCOUNTER — Other Ambulatory Visit: Payer: Self-pay | Admitting: Family Medicine

## 2024-05-30 ENCOUNTER — Ambulatory Visit: Admitting: Family Medicine

## 2024-10-11 ENCOUNTER — Inpatient Hospital Stay: Admitting: Hematology

## 2024-10-11 ENCOUNTER — Inpatient Hospital Stay

## 2025-01-15 ENCOUNTER — Ambulatory Visit
